# Patient Record
Sex: Male | Born: 1965 | Race: Black or African American | Hispanic: No | Smoking: Never smoker
Health system: Southern US, Community
[De-identification: ages and names within clinical notes are randomized; demographics above are authoritative.]

## PROBLEM LIST (undated history)

## (undated) ENCOUNTER — Emergency Department (HOSPITAL_COMMUNITY): Payer: Medicaid Other | Source: Home / Self Care

## (undated) ENCOUNTER — Emergency Department (HOSPITAL_COMMUNITY): Admission: EM | Discharge status: Absent without leave | Payer: MEDICAID | Source: Home / Self Care

## (undated) ENCOUNTER — Emergency Department (HOSPITAL_COMMUNITY): Admission: EM | Payer: Medicaid Other | Source: Home / Self Care

## (undated) DIAGNOSIS — J189 Pneumonia, unspecified organism: Secondary | ICD-10-CM

## (undated) DIAGNOSIS — S86012A Strain of left Achilles tendon, initial encounter: Secondary | ICD-10-CM

## (undated) DIAGNOSIS — K029 Dental caries, unspecified: Secondary | ICD-10-CM

## (undated) DIAGNOSIS — F32A Depression, unspecified: Secondary | ICD-10-CM

## (undated) DIAGNOSIS — S065X9A Traumatic subdural hemorrhage with loss of consciousness of unspecified duration, initial encounter: Secondary | ICD-10-CM

## (undated) DIAGNOSIS — I509 Heart failure, unspecified: Secondary | ICD-10-CM

## (undated) DIAGNOSIS — I2699 Other pulmonary embolism without acute cor pulmonale: Secondary | ICD-10-CM

## (undated) DIAGNOSIS — F319 Bipolar disorder, unspecified: Secondary | ICD-10-CM

## (undated) DIAGNOSIS — G4730 Sleep apnea, unspecified: Secondary | ICD-10-CM

## (undated) DIAGNOSIS — I10 Essential (primary) hypertension: Secondary | ICD-10-CM

## (undated) HISTORY — PX: APPENDECTOMY: SHX54

## (undated) HISTORY — PX: CYSTECTOMY: SUR359

## (undated) HISTORY — PX: FRACTURE SURGERY: SHX138

## (undated) HISTORY — DX: Sleep apnea, unspecified: G47.30

## (undated) HISTORY — PX: ELBOW SURGERY: SHX618

---

## 2007-05-18 DIAGNOSIS — I2699 Other pulmonary embolism without acute cor pulmonale: Secondary | ICD-10-CM

## 2007-05-18 HISTORY — DX: Other pulmonary embolism without acute cor pulmonale: I26.99

## 2013-01-01 ENCOUNTER — Ambulatory Visit
Admission: RE | Admit: 2013-01-01 | Discharge: 2013-01-01 | Disposition: A | Discharge status: Home / Self Care | Payer: Medicaid Other | Source: Ambulatory Visit | Attending: Family Medicine | Admitting: Family Medicine

## 2013-01-01 ENCOUNTER — Other Ambulatory Visit: Payer: Self-pay | Admitting: Family Medicine

## 2013-01-01 DIAGNOSIS — I749 Embolism and thrombosis of unspecified artery: Secondary | ICD-10-CM

## 2013-01-04 ENCOUNTER — Inpatient Hospital Stay (HOSPITAL_COMMUNITY)
Admission: EM | Admit: 2013-01-04 | Discharge: 2013-01-08 | DRG: 041 | Disposition: A | Discharge status: Home / Self Care | Payer: Medicaid Other | Attending: Pulmonary Disease | Admitting: Pulmonary Disease

## 2013-01-04 ENCOUNTER — Encounter (HOSPITAL_COMMUNITY): Payer: Self-pay | Admitting: Emergency Medicine

## 2013-01-04 ENCOUNTER — Emergency Department (HOSPITAL_COMMUNITY): Payer: Medicaid Other

## 2013-01-04 DIAGNOSIS — Z7901 Long term (current) use of anticoagulants: Secondary | ICD-10-CM

## 2013-01-04 DIAGNOSIS — Z86711 Personal history of pulmonary embolism: Secondary | ICD-10-CM

## 2013-01-04 DIAGNOSIS — J019 Acute sinusitis, unspecified: Secondary | ICD-10-CM | POA: Diagnosis present

## 2013-01-04 DIAGNOSIS — T458X1A Poisoning by other primarily systemic and hematological agents, accidental (unintentional), initial encounter: Secondary | ICD-10-CM

## 2013-01-04 DIAGNOSIS — I2699 Other pulmonary embolism without acute cor pulmonale: Secondary | ICD-10-CM | POA: Diagnosis present

## 2013-01-04 DIAGNOSIS — R51 Headache: Secondary | ICD-10-CM | POA: Diagnosis present

## 2013-01-04 DIAGNOSIS — D6832 Hemorrhagic disorder due to extrinsic circulating anticoagulants: Secondary | ICD-10-CM

## 2013-01-04 DIAGNOSIS — R791 Abnormal coagulation profile: Secondary | ICD-10-CM | POA: Diagnosis present

## 2013-01-04 DIAGNOSIS — I6200 Nontraumatic subdural hemorrhage, unspecified: Principal | ICD-10-CM

## 2013-01-04 DIAGNOSIS — I2510 Atherosclerotic heart disease of native coronary artery without angina pectoris: Secondary | ICD-10-CM | POA: Diagnosis present

## 2013-01-04 DIAGNOSIS — T45515A Adverse effect of anticoagulants, initial encounter: Secondary | ICD-10-CM | POA: Diagnosis present

## 2013-01-04 DIAGNOSIS — Z79899 Other long term (current) drug therapy: Secondary | ICD-10-CM

## 2013-01-04 DIAGNOSIS — T45511A Poisoning by anticoagulants, accidental (unintentional), initial encounter: Secondary | ICD-10-CM

## 2013-01-04 DIAGNOSIS — S065XAA Traumatic subdural hemorrhage with loss of consciousness status unknown, initial encounter: Secondary | ICD-10-CM | POA: Diagnosis present

## 2013-01-04 DIAGNOSIS — S065X9A Traumatic subdural hemorrhage with loss of consciousness of unspecified duration, initial encounter: Secondary | ICD-10-CM

## 2013-01-04 DIAGNOSIS — R000 Tachycardia, unspecified: Secondary | ICD-10-CM | POA: Diagnosis not present

## 2013-01-04 DIAGNOSIS — F3130 Bipolar disorder, current episode depressed, mild or moderate severity, unspecified: Secondary | ICD-10-CM | POA: Diagnosis present

## 2013-01-04 DIAGNOSIS — R519 Headache, unspecified: Secondary | ICD-10-CM | POA: Diagnosis present

## 2013-01-04 DIAGNOSIS — I498 Other specified cardiac arrhythmias: Secondary | ICD-10-CM | POA: Diagnosis not present

## 2013-01-04 DIAGNOSIS — F319 Bipolar disorder, unspecified: Secondary | ICD-10-CM | POA: Diagnosis present

## 2013-01-04 HISTORY — DX: Traumatic subdural hemorrhage with loss of consciousness of unspecified duration, initial encounter: S06.5X9A

## 2013-01-04 HISTORY — DX: Hemorrhagic disorder due to extrinsic circulating anticoagulants: D68.32

## 2013-01-04 HISTORY — DX: Bipolar disorder, unspecified: F31.9

## 2013-01-04 HISTORY — DX: Other pulmonary embolism without acute cor pulmonale: I26.99

## 2013-01-04 HISTORY — DX: Traumatic subdural hemorrhage with loss of consciousness status unknown, initial encounter: S06.5XAA

## 2013-01-04 LAB — URINALYSIS, ROUTINE W REFLEX MICROSCOPIC
Glucose, UA: NEGATIVE mg/dL
Hgb urine dipstick: NEGATIVE
Leukocytes, UA: NEGATIVE
Nitrite: NEGATIVE
Specific Gravity, Urine: 1.009 (ref 1.005–1.030)
Urobilinogen, UA: 0.2 mg/dL (ref 0.0–1.0)

## 2013-01-04 LAB — RAPID URINE DRUG SCREEN, HOSP PERFORMED
Amphetamines: NOT DETECTED
Barbiturates: NOT DETECTED
Benzodiazepines: NOT DETECTED
Cocaine: NOT DETECTED
Tetrahydrocannabinol: NOT DETECTED

## 2013-01-04 LAB — TYPE AND SCREEN
ABO/RH(D): A POS
Antibody Screen: NEGATIVE

## 2013-01-04 LAB — COMPREHENSIVE METABOLIC PANEL
Albumin: 4.1 g/dL (ref 3.5–5.2)
BUN: 9 mg/dL (ref 6–23)
Creatinine, Ser: 1.23 mg/dL (ref 0.50–1.35)
GFR calc Af Amer: 78 mL/min — ABNORMAL LOW (ref >90)
Total Bilirubin: 0.6 mg/dL (ref 0.3–1.2)
Total Protein: 8 g/dL (ref 6.0–8.3)

## 2013-01-04 LAB — APTT
aPTT: 114 seconds — ABNORMAL HIGH (ref 24–37)
aPTT: 33 seconds (ref 24–37)

## 2013-01-04 LAB — POCT I-STAT 3, ART BLOOD GAS (G3+)
pCO2 arterial: 41.1 mmHg (ref 35.0–45.0)
pO2, Arterial: 79 mmHg — ABNORMAL LOW (ref 80.0–100.0)

## 2013-01-04 LAB — ABO/RH: ABO/RH(D): A POS

## 2013-01-04 LAB — PROTIME-INR
INR: 5.07 (ref 0.00–1.49)
INR: 1.29 (ref 0.00–1.49)
Prothrombin Time: 44.8 seconds — ABNORMAL HIGH (ref 11.6–15.2)
Prothrombin Time: 15.8 seconds — ABNORMAL HIGH (ref 11.6–15.2)

## 2013-01-04 LAB — BASIC METABOLIC PANEL
CO2: 28 mEq/L (ref 19–32)
Calcium: 9.2 mg/dL (ref 8.4–10.5)
Creatinine, Ser: 1.38 mg/dL — ABNORMAL HIGH (ref 0.50–1.35)
GFR calc non Af Amer: 59 mL/min — ABNORMAL LOW (ref >90)
Glucose, Bld: 101 mg/dL — ABNORMAL HIGH (ref 70–99)
Sodium: 141 mEq/L (ref 135–145)

## 2013-01-04 LAB — PHOSPHORUS: Phosphorus: 3 mg/dL (ref 2.3–4.6)

## 2013-01-04 LAB — CBC
MCH: 28.7 pg (ref 26.0–34.0)
MCHC: 34 g/dL (ref 30.0–36.0)
MCV: 84.6 fL (ref 78.0–100.0)
Platelets: 271 10*3/uL (ref 150–400)
RDW: 14.6 % (ref 11.5–15.5)
WBC: 9.3 10*3/uL (ref 4.0–10.5)

## 2013-01-04 LAB — MAGNESIUM: Magnesium: 2.2 mg/dL (ref 1.5–2.5)

## 2013-01-04 LAB — STREP PNEUMONIAE URINARY ANTIGEN: Strep Pneumo Urinary Antigen: NEGATIVE

## 2013-01-04 LAB — MRSA PCR SCREENING: MRSA by PCR: NEGATIVE

## 2013-01-04 MED ORDER — OXYMETAZOLINE HCL 0.05 % NA SOLN
1.0000 | Freq: Two times a day (BID) | NASAL | Status: DC
  Administered 2013-01-04 – 2013-01-05 (×3): 1 via NASAL
  Filled 2013-01-04: qty 15

## 2013-01-04 MED ORDER — QUETIAPINE FUMARATE 400 MG PO TABS
800.0000 mg | ORAL_TABLET | Freq: Every day | ORAL | Status: DC
  Administered 2013-01-04 – 2013-01-07 (×4): 800 mg via ORAL
  Filled 2013-01-04 (×5): qty 2

## 2013-01-04 MED ORDER — FLUTICASONE PROPIONATE 50 MCG/ACT NA SUSP
2.0000 | Freq: Every day | NASAL | Status: DC
  Administered 2013-01-04 – 2013-01-08 (×5): 2 via NASAL
  Filled 2013-01-04 (×2): qty 16

## 2013-01-04 MED ORDER — ACETAMINOPHEN 325 MG PO TABS
650.0000 mg | ORAL_TABLET | ORAL | Status: DC | PRN
  Administered 2013-01-04: 650 mg via ORAL
  Filled 2013-01-04: qty 2

## 2013-01-04 MED ORDER — VITAMIN K1 10 MG/ML IJ SOLN
10.0000 mg | Freq: Every day | INTRAMUSCULAR | Status: AC
  Administered 2013-01-05 – 2013-01-06 (×2): 10 mg via SUBCUTANEOUS
  Filled 2013-01-04 (×2): qty 1

## 2013-01-04 MED ORDER — PANTOPRAZOLE SODIUM 40 MG IV SOLR
40.0000 mg | Freq: Every day | INTRAVENOUS | Status: DC
  Administered 2013-01-04: 40 mg via INTRAVENOUS
  Filled 2013-01-04 (×2): qty 40

## 2013-01-04 MED ORDER — VITAMIN K1 10 MG/ML IJ SOLN
10.0000 mg | Freq: Once | INTRAMUSCULAR | Status: AC
  Administered 2013-01-04: 10 mg via SUBCUTANEOUS
  Filled 2013-01-04: qty 1

## 2013-01-04 MED ORDER — VITAMIN K1 10 MG/ML IJ SOLN
10.0000 mg | Freq: Once | INTRAVENOUS | Status: AC
  Administered 2013-01-04: 10 mg via INTRAVENOUS
  Filled 2013-01-04: qty 1

## 2013-01-04 MED ORDER — LITHIUM CARBONATE ER 300 MG PO TBCR
600.0000 mg | EXTENDED_RELEASE_TABLET | Freq: Every day | ORAL | Status: DC
  Administered 2013-01-04 – 2013-01-07 (×4): 600 mg via ORAL
  Filled 2013-01-04 (×6): qty 2

## 2013-01-04 MED ORDER — FENTANYL CITRATE 0.05 MG/ML IJ SOLN
12.5000 ug | INTRAMUSCULAR | Status: DC | PRN
Start: 2013-01-04 — End: 2013-01-08
  Administered 2013-01-04 – 2013-01-05 (×3): 12.5 ug via INTRAVENOUS
  Administered 2013-01-07: 25 ug via INTRAVENOUS
  Administered 2013-01-07: 12.5 ug via INTRAVENOUS
  Administered 2013-01-07: 25 ug via INTRAVENOUS
  Administered 2013-01-08: 12.5 ug via INTRAVENOUS
  Filled 2013-01-04 (×7): qty 2

## 2013-01-04 MED ORDER — MIRTAZAPINE 45 MG PO TABS
45.0000 mg | ORAL_TABLET | Freq: Every day | ORAL | Status: DC
  Administered 2013-01-04 – 2013-01-07 (×4): 45 mg via ORAL
  Filled 2013-01-04 (×6): qty 1

## 2013-01-04 MED ORDER — LITHIUM CARBONATE 300 MG PO CAPS
600.0000 mg | ORAL_CAPSULE | Freq: Every day | ORAL | Status: DC
  Filled 2013-01-04: qty 2

## 2013-01-04 MED ORDER — AMOXICILLIN-POT CLAVULANATE 875-125 MG PO TABS
1.0000 | ORAL_TABLET | Freq: Two times a day (BID) | ORAL | Status: DC
  Administered 2013-01-04 – 2013-01-08 (×8): 1 via ORAL
  Filled 2013-01-04 (×9): qty 1

## 2013-01-04 MED ORDER — LITHIUM CARBONATE ER 450 MG PO TBCR
450.0000 mg | EXTENDED_RELEASE_TABLET | ORAL | Status: DC
  Administered 2013-01-05 – 2013-01-08 (×4): 450 mg via ORAL
  Filled 2013-01-04 (×7): qty 1

## 2013-01-04 MED ORDER — SODIUM CHLORIDE 0.9 % IV SOLN
INTRAVENOUS | Status: DC
  Administered 2013-01-04: 13:00:00 via INTRAVENOUS

## 2013-01-04 NOTE — ED Notes (Signed)
CC MD at bedside 

## 2013-01-04 NOTE — Progress Notes (Signed)
eLink Physician-Brief Progress Note Patient Name: Justin [name] DOB: October 07, 1965 MRN: [id number]  Date of Service  01/04/2013   HPI/Events of Note     eICU Interventions  Tyelnol/ fent prn pain   Intervention Category Minor Interventions: Routine modifications to care plan (e.g. PRN medications for pain, fever)  ALVA,RAKESH V. 01/04/2013, 7:03 PM

## 2013-01-04 NOTE — H&P (Signed)
PULMONARY  / CRITICAL CARE MEDICINE  Name: Justin [name] MRN: [id number] DOB: April 07, 1965    ADMISSION DATE:  01/04/2013   REFERRING MD : EDP PRIMARY SERVICE: *PCCM  CHIEF COMPLAINT:  headache  BRIEF PATIENT DESCRIPTION:  47 yo AAM disabled secondary to bipolar depression who recently moved to [place]. 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 He has had a headache for a week and has been self medicating with bufferin. He presents to [facility] with SDH and INR 5.9. He does nor require operative intervention.  We will need to reverse coumadin, obtain records from [place]. Of note he has a sinus infection which we will treat. Question is whether he can remain of coumadin in future.  SIGNIFICANT EVENTS / STUDIES:  10-19 sdh  LINES / TUBES:   CULTURES:   ANTIBIOTICS: 10-19 augmentin>>  HISTO69 yo AAM disabled secondary to bipolar depression who recently moved to [place]. 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 He has had a headache for a week and has been self medicating with bufferin. He presents to [facility] with SDH and INR 5.9. He does nor require operative intervention.  We will need to reverse coumadin, obtain records from [place]. Of note he has a sinus infection which we will treat. Question is whether he can remain of coumadin in future.RY OF PRESENT ILLNESS:     PAST MEDICAL HISTORY :  Past Medical History  Diagnosis Date  . Pulmonary embolism   . Bipolar 1 disorder    Past Surgical History  Procedure Laterality Date  . Appendectomy    . Cystectomy      right head   Prior to Admission medications   Medication Sig Start Date End Date Taking? Authorizing Provider  Homeopathic Products (SINUS MEDICINE PO) Take 1 tablet by mouth daily as needed (congestion).   Yes Historical Provider, MD  lithium 600 MG capsule Take 600 mg by mouth at bedtime.   Yes Historical Provider, MD  lithium carbonate (ESKALITH) 450 MG CR tablet Take 450 mg by mouth every morning.    Yes Historical Provider, MD  mirtazapine (REMERON) 45 MG tablet Take 45 mg by mouth at bedtime.   Yes Historical Provider, MD  naproxen sodium (ANAPROX) 220 MG tablet Take 440 mg by mouth daily as needed (pain).   Yes Historical Provider, MD  QUEtiapine (SEROQUEL) 400 MG tablet Take 800 mg by mouth at bedtime.   Yes Historical Provider, MD  warfarin (COUMADIN) 5 MG tablet Take 5 mg by mouth 3 (three) times a week. Take on Monday, Wednesday, and Friday. Alternate with 7.5mg    Yes Historical Provider, MD  warfarin (COUMADIN) 7.5 MG tablet Take 7.5 mg by mouth 4 (four) times a week. Take on Tuesday, Thursday, Saturday, and Sunday. Alternate with 5 mg.   Yes Historical Provider, MD   Not on File  FAMILY HISTORY:  No family hx of bleeds or clots SOCIAL HISTORY: Hx of cocaine abuse with recent detox REVIEW OF SYSTEMS:  . 10 point review of system taken, please see HPI for positives and negatives.   SUBJECTIVE:  Awake and alert VITAL SIGNS: Temp:  [97.8 F (36.6 C)-97.9 F (36.6 C)] 97.9 F (36.6 C) (10/19 1312) Pulse Rate:  [74-87] 83 (10/19 1312) Resp:  [18-20] 18 (10/19 1312) BP: (137-150)/(89-99) 150/90 mmHg (10/19 1312) SpO2:  [99 %-100 %] 99 % (10/19 1312) HEMODYNAMICS:   VENTILATOR SETTINGS:   INTAKE / OUTPUT: Intake/Output  10/18 0701 - 10/19 0700 10/19 0701 - 10/20 0700   Blood  12   Total Intake   12   Net   +12          PHYSICAL EXAMINATION: General:  wnwdaamnad @rest  Neuro:  Appears intact, speech clear, MAE x 4 HEENT:  Tenser frontal sinus area, no jador LAN Cardiovascular:  hsr rrr Lungs:  cta Abdomen:  Soft +bs Musculoskeletal:  Intact, no lower ext tenderness Skin:  warm  LABS:  CBC Recent Labs     01/04/13  1225  WBC  9.3  HGB  12.5*  HCT  36.8*  PLT  271   Coag's Recent Labs     01/04/13  1156  01/04/13  1225  APTT   --   114*  INR  5.07*   --    BMET No results found for this basename: NA, K, CL, CO2, BUN, CREATININE, GLUCOSE,  in  the last 72 hours Electrolytes No results found for this basename: CALCIUM, MG, PHOS,  in the last 72 hours Sepsis Markers No results found for this basename: LACTICACIDVEN, PROCALCITON, O2SATVEN,  in the last 72 hours ABG No results found for this basename: PHART, PCO2ART, PO2ART,  in the last 72 hours Liver Enzymes No results found for this basename: AST, ALT, ALKPHOS, BILITOT, ALBUMIN,  in the last 72 hours Cardiac Enzymes No results found for this basename: TROPONINI, PROBNP,  in the last 72 hours Glucose No results found for this basename: GLUCAP,  in the last 72 hours  Imaging Ct Head Wo Contrast  01/04/2013   CLINICAL DATA:  47 year old male with headache unresponsive to over the counter medication. History of blood clots, on Coumadin. Initial encounter.  EXAM: CT HEAD WITHOUT CONTRAST  TECHNIQUE: Contiguous axial images were obtained from the base of the skull through the vertex without intravenous contrast.  COMPARISON:  None.  FINDINGS: Opacified right frontal, ethmoid, and maxillary sinuses. Evidence of periosteal thickening suggesting chronic sinusitis. Other Visualized paranasal sinuses and mastoids are clear. No acute osseous abnormality identified.  Visualized orbits and scalp soft tissues are within normal limits.  Mixed density right subdural hematoma. Hyperdense blood products also along the interhemispheric fissure and tentorium. Mass effect on the right hemisphere with 9-10 mm of leftward midline shift. The subdural collection measures up to approximately 9 mm in thickness along much of the right convexity.  No ventriculomegaly. No evidence of cortically based acute infarction identified. Basilar cisterns remain patent. No suspicious intracranial vascular hyperdensity.  IMPRESSION: 1. Mixed density right subdural hematoma measuring up to about 9 mm in thickness with mass effect on the right hemisphere and associated 9-10 mm of leftward midline shift. Smaller volume parafalcine  and tentorial blood.  2. No ventriculomegaly and basilar cisterns remain patent.  3. Chronic appearing right paranasal sinusitis.  Critical Value/emergent results were called by telephone at the time of interpretation on 01/04/2013 at 12:00 PM to [facility] , who verbally acknowledged these results.   Electronically Signed   By: Augusto Gamble M.D.   On: 01/04/2013 12:01       ASSESSMENT / PLAN:  PULMONARY A:  Hx of PE P:   Stop coumadin Obtain records from [place] as to whether he will need IVC  CARDIOVASCULAR A:No acute issue P:    RENAL A:    No acute issueP:     GASTROINTESTINAL A: GI protection P:   ppi  HEMATOLOGIC A:  Elevated INR P:  4 ffp Vit k  x 3 days Serial inrs  INFECTIOUS A:  Sinus infection P:   Po augmentin  Flonase  ENDOCRINE A:  No acute issue P:     NEUROLOGIC A: SDH      Bipolar      Substance abuse P:   Reverse coumadin NS to follow  TODAY'S SUMMARY:  47 yo AAM disabled secondary to bipolar depression who recently moved to [place]. 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 He has had a headache for a week and has been self medicating with bufferin. He presents to [facility] with SDH and INR 5.9. He does nor require operative intervention.  We will need to reverse coumadin, obtain records from [place]. Of note he has a sinus infection which we will treat. Question is whether he can remain of coumadin in future.  Justin [name] ACNP [facility] PCCM Pager [phone] till 3 pm If no answer page [phone] 01/04/2013, 1:27 PM   I have interviewed and examined the patient and reviewed the database. I have formulated the assessment and plan as reflected in the note above with amendments made by me.  The following problems were identified and addressed on admission:  Active Problems:   SDH (subdural hematoma)   H/O PE   Chronic anticoagulation   Warfarin-induced coagulopathy   Acute sinusitis    Justin Fischer, MD;  PCCM service; [place]  [phone]

## 2013-01-04 NOTE — ED Provider Notes (Signed)
CSN: [id number]     Arrival date & time 01/04/13  1031 History   First MD Initiated Contact with Patient 01/04/13 1058     Chief Complaint  Patient presents with  . Headache   (Consider location/radiation/quality/duration/timing/severity/associated sxs/prior Treatment) Patient is a 47 y.o. male presenting with headaches. The history is provided by the patient.  Headache Associated symptoms: sinus pressure   Associated symptoms: no abdominal pain, no back pain, no diarrhea, no pain, no nausea, no neck stiffness, no numbness and no vomiting    patient's had a headache for the last 5-7 days. Is somewhat over his right sinuses and states that he feels congested. States it also is on the left side of his head. No difficulty seeing. No nausea or vomiting. No confusion. He states that today he also began to have some blood from his gums. He states he is on Coumadin for blood clots in his lungs. He recently moved to [place] from [place].  Past Medical History  Diagnosis Date  . Pulmonary embolism   . Bipolar 1 disorder    Past Surgical History  Procedure Laterality Date  . Appendectomy    . Cystectomy      right head   History reviewed. No pertinent family history. History  Substance Use Topics  . Smoking status: Never Smoker   . Smokeless tobacco: Not on file  . Alcohol Use: No    Review of Systems  Constitutional: Negative for activity change and appetite change.  HENT: Positive for sinus pressure. Negative for dental problem and nosebleeds.   Eyes: Negative for pain.  Respiratory: Negative for chest tightness and shortness of breath.   Cardiovascular: Negative for chest pain and leg swelling.  Gastrointestinal: Negative for nausea, vomiting, abdominal pain and diarrhea.  Genitourinary: Negative for flank pain.  Musculoskeletal: Negative for back pain and neck stiffness.  Skin: Negative for rash.  Neurological: Positive for headaches. Negative for weakness and numbness.   Psychiatric/Behavioral: Negative for behavioral problems.    Allergies  Review of patient's allergies indicates no known allergies.  Home Medications   Current Outpatient Rx  Name  Route  Sig  Dispense  Refill  . Homeopathic Products (SINUS MEDICINE PO)   Oral   Take 1 tablet by mouth daily as needed (congestion).         Marland Kitchen lithium 600 MG capsule   Oral   Take 600 mg by mouth at bedtime.         Marland Kitchen lithium carbonate (ESKALITH) 450 MG CR tablet   Oral   Take 450 mg by mouth every morning.         . mirtazapine (REMERON) 45 MG tablet   Oral   Take 45 mg by mouth at bedtime.         . naproxen sodium (ANAPROX) 220 MG tablet   Oral   Take 440 mg by mouth daily as needed (pain).         . QUEtiapine (SEROQUEL) 400 MG tablet   Oral   Take 800 mg by mouth at bedtime.         Marland Kitchen warfarin (COUMADIN) 5 MG tablet   Oral   Take 5 mg by mouth 3 (three) times a week. Take on Monday, Wednesday, and Friday. Alternate with 7.5mg          . warfarin (COUMADIN) 7.5 MG tablet   Oral   Take 7.5 mg by mouth 4 (four) times a week. Take on Tuesday, Thursday, Saturday, and Sunday.  Alternate with 5 mg.          BP 150/90  Pulse 72  Temp(Src) 97.9 F (36.6 C) (Oral)  Resp 13  SpO2 100% Physical Exam  Nursing note and vitals reviewed. Constitutional: He is oriented to person, place, and time. He appears well-developed and well-nourished.  HENT:  Head: Normocephalic and atraumatic.  Mild injection and swelling of nasal mucosa  Eyes: EOM are normal. Pupils are equal, round, and reactive to light.  Neck: Normal range of motion. Neck supple.  Cardiovascular: Normal rate, regular rhythm and normal heart sounds.   No murmur heard. Pulmonary/Chest: Effort normal and breath sounds normal.  Abdominal: Soft. Bowel sounds are normal. He exhibits no distension and no mass. There is no tenderness. There is no rebound and no guarding.  Musculoskeletal: Normal range of motion. He  exhibits no edema.  Neurological: He is alert and oriented to person, place, and time. No cranial nerve deficit.  Skin: Skin is warm and dry.  Psychiatric: He has a normal mood and affect.    ED Course  Procedures (including critical care time) Labs Review Labs Reviewed  PROTIME-INR - Abnormal; Notable for the following:    Prothrombin Time 44.8 (*)    INR 5.07 (*)    All other components within normal limits  APTT - Abnormal; Notable for the following:    aPTT 114 (*)    All other components within normal limits  BASIC METABOLIC PANEL - Abnormal; Notable for the following:    Glucose, Bld 101 (*)    Creatinine, Ser 1.38 (*)    GFR calc non Af Amer 59 (*)    GFR calc Af Amer 68 (*)    All other components within normal limits  CBC - Abnormal; Notable for the following:    Hemoglobin 12.5 (*)    HCT 36.8 (*)    All other components within normal limits  CULTURE, BLOOD (ROUTINE X 2)  CULTURE, BLOOD (ROUTINE X 2)  URINE CULTURE  COMPREHENSIVE METABOLIC PANEL  MAGNESIUM  PHOSPHORUS  LACTIC ACID, PLASMA  PROCALCITONIN  PROTIME-INR  APTT  URINALYSIS, ROUTINE W REFLEX MICROSCOPIC  STREP PNEUMONIAE URINARY ANTIGEN  LEGIONELLA ANTIGEN, URINE  BLOOD GAS, ARTERIAL  URINE RAPID DRUG SCREEN (HOSP PERFORMED)  TYPE AND SCREEN  PREPARE FRESH FROZEN PLASMA  PREPARE FRESH FROZEN PLASMA  ABO/RH   Imaging Review Ct Head Wo Contrast  01/04/2013   CLINICAL DATA:  47 year old male with headache unresponsive to over the counter medication. History of blood clots, on Coumadin. Initial encounter.  EXAM: CT HEAD WITHOUT CONTRAST  TECHNIQUE: Contiguous axial images were obtained from the base of the skull through the vertex without intravenous contrast.  COMPARISON:  None.  FINDINGS: Opacified right frontal, ethmoid, and maxillary sinuses. Evidence of periosteal thickening suggesting chronic sinusitis. Other Visualized paranasal sinuses and mastoids are clear. No acute osseous abnormality  identified.  Visualized orbits and scalp soft tissues are within normal limits.  Mixed density right subdural hematoma. Hyperdense blood products also along the interhemispheric fissure and tentorium. Mass effect on the right hemisphere with 9-10 mm of leftward midline shift. The subdural collection measures up to approximately 9 mm in thickness along much of the right convexity.  No ventriculomegaly. No evidence of cortically based acute infarction identified. Basilar cisterns remain patent. No suspicious intracranial vascular hyperdensity.  IMPRESSION: 1. Mixed density right subdural hematoma measuring up to about 9 mm in thickness with mass effect on the right hemisphere and associated 9-10 mm of  leftward midline shift. Smaller volume parafalcine and tentorial blood.  2. No ventriculomegaly and basilar cisterns remain patent.  3. Chronic appearing right paranasal sinusitis.  Critical Value/emergent results were called by telephone at the time of interpretation on 01/04/2013 at 12:00 PM to [facility] , who verbally acknowledged these results.   Electronically Signed   By: Augusto Gamble M.D.   On: 01/04/2013 12:01    EKG Interpretation   None       MDM   1. Subdural hematoma   2. Warfarin toxicity, initial encounter    Patient with headache. Likely sinusitis. Has subdural hematoma. He is on anticoagulation and is toxic with an INR of 5. After discussion with Dr. Newell Coral from neurosurgery patient was given IV vitamin K and 2 units of emergency release FFP. Critical care was consulted for further coagulation monitoring. Patient will be admitted to the ICU.  CRITICAL CARE Performed by: Billee Cashing Total critical care time: 30 Critical care time was exclusive of separately billable procedures and treating other patients. Critical care was necessary to treat or prevent imminent or life-threatening deterioration. Critical care was time spent personally by me on the following activities:  development of treatment plan with patient and/or surrogate as well as nursing, discussions with consultants, evaluation of patient's response to treatment, examination of patient, obtaining history from patient or surrogate, ordering and performing treatments and interventions, ordering and review of laboratory studies, ordering and review of radiographic studies, pulse oximetry and re-evaluation of patient's condition.     Juliet Rude. Rubin Payor, MD 01/04/13 1437

## 2013-01-04 NOTE — ED Notes (Signed)
Pt c/o HA x 1 week. Pt states OTC medications ineffective. Denies CP, SOB, LOC. NSD. Pt states he is on warfarin for blood clots

## 2013-01-04 NOTE — Consult Note (Signed)
Reason for Consult: Right hemispheric subdural  Referring Physician:  Dr. Benjiman Core  Justin [name] is an 47 y.o. left-handed black male.  HPI: Patient presented to the emergency room today because of bleeding in and around his mouth. He also describes a one-week history of right hemicranium headache and right nasal congestion with discomfort over the right maxillary sinus. He did use some over-the-counter sinus medications this past week. He does report a history of sinusitis.  His history is notable for history of pulmonary embolism diagnosed and managed in [place], [place]. He's been on Coumadin for at least the past 4 or 5 years. He explained that he takes Coumadin 7.5 mg on Tuesday, Thursday, Saturday, and Sunday and 5 mg on Monday, Wednesday, and Friday. He also has a history of crack cocaine abuse and just finished a drug and alcohol treatment program, and moved from [facility] 2 and a half weeks ago to live with his sister here in [place] area. Lastly he has a history of bipolar disorder for which he takes Seroquel, Remeron, and lithium.  Patient denies other complaints. Denies any seizures, weakness, double vision, blurred vision, or sensory dysfunction.  Patient was evaluated by Dr. Benjiman Core (EDP). He reported that the patient was neurologically intact. CT scan of the brain without contrast revealed a mixed density right hemispheric subdural hematoma with what appears to be some acute subdural hematoma overlying the right tentorium. There is mass effect and right to left shift. Also noted is what appears to be opacification of the right maxillary and ethmoid sinuses, although the sinuses are not fully visualized on the CT of the brain. Neurosurgical consultation requested. I recommended reversal of his anticoagulation at least initially with vitamin K 10 mg IV and 10 mg subcutaneous, as well as 2 units of fresh frozen plasma. Subsequently his laboratory was returned  revealing a profound iatrogenic coagulopathy with a prothrombin time (PT) a 44.8 and a INR 5.07. With the patient being neurologically intact I do not anticipate the patient requiring immediate surgical intervention, and therefore recommended critical care medicine consultation for admission, and that I would be able to follow the patient is a neurosurgical consultant.  Past medical history: Notable for history of pulmonary embolism, crack cocaine abuse, and bipolar disorder. He denies any history of hypertension, myocardial infarction, cancer, ecchymoses, stroke, diabetes, lung disease.    Past surgical history: Appendectomy and removal of cysts from the scalp.   He denies allergies to medications.   His medications include Seroquel 800 mg each bedtime, Remeron 45 mg each bedtime, lithium 450 mg twice a day and 600 mg each bedtime, and Coumadin 7.5 mg on Tuesday, Thursday, Saturday, and Sunday, and 5 mg on Monday, Wednesday, and Friday.  Family history:  Mother died age 44, she had a history of hypertension,.a massive stroke. Father died at an early age and patient does not know the cause.  Social history: Patient has never married. He has 4 children, including one with Downs syndrome who lives with his sister here in [place]. Patient doesn't smoke or drink or beverages but does have a history of crack cocaine use. Patient's been on disability for the past 7 years.  ROS:  Notable for those difficulties described as history of present illness and past medical history.  Physical Examination: Patient a well-developed well nourished black male in no acute distress. Blood pressure 137/89, pulse 74, temperature 97.8 F (36.6 C), temperature source Oral, resp. rate 20, SpO2 100.00%. Lungs:  Clear to  auscultation, he has symmetrical respiratory excursion. Heart:  Regular rhythm, normal S1-S2. Abdomen:  Soft, nondistended, bowel sounds present Extremity:  No clubbing, cyanosis, or  edema.  Neurological Examination: Mental Status Examination:  Awake, alert, oriented to name, [facility], [place], and 01/04/2013. Speech is fluent. Good comprehension. Cranial Nerve Examination:  Pupils are equal round and reactive light. EOMI. Facial sensation intact. Facial movements symmetrical. Hearing present bilaterally. Palatal movement symmetrical. Shoulder shrug is symmetrical. Tongue midline Motor Examination:  5/5 strength in the upper and lower extremities, including the deltoid, biceps, triceps, intrinsics, grips, iliopsoas, quadriceps, dorsiflexors, and plantar flexors bilaterally. He has no drift of the upper extremities. Sensory Examination:  Intact to pinprick to the upper and lower extremities Reflex Examination:   Trace in the upper and lower extremities. Toes downgoing bilaterally. Gait and Stance Examination:  Not tested due to the nature the patient's condition.   Results for orders placed during the hospital encounter of 01/04/13 (from the past 48 hour(s))  PROTIME-INR     Status: Abnormal   Collection Time    01/04/13 11:56 AM      Result Value Range   Prothrombin Time 44.8 (*) 11.6 - 15.2 seconds   INR 5.07 (*) 0.00 - 1.49   Comment: REPEATED TO VERIFY     CRITICAL RESULT CALLED TO, READ BACK BY AND VERIFIED WITH:     C.ERICSON,RN 1253 01/04/13 M.CAMPBELL  APTT     Status: Abnormal   Collection Time    01/04/13 12:25 PM      Result Value Range   aPTT 114 (*) 24 - 37 seconds   Comment:            IF BASELINE aPTT IS ELEVATED,     SUGGEST PATIENT RISK ASSESSMENT     BE USED TO DETERMINE APPROPRIATE     ANTICOAGULANT THERAPY.  CBC     Status: Abnormal   Collection Time    01/04/13 12:25 PM      Result Value Range   WBC 9.3  4.0 - 10.5 K/uL   RBC 4.35  4.22 - 5.81 MIL/uL   Hemoglobin 12.5 (*) 13.0 - 17.0 g/dL   HCT 24.4 (*) 01.0 - 27.2 %   MCV 84.6  78.0 - 100.0 fL   MCH 28.7  26.0 - 34.0 pg   MCHC 34.0  30.0 - 36.0 g/dL   RDW 53.6  64.4 -  03.4 %   Platelets 271  150 - 400 K/uL  PREPARE FRESH FROZEN PLASMA     Status: None   Collection Time    01/04/13  1:00 PM      Result Value Range   Unit Number [id number]     Blood Component Type THAWED PLASMA     Unit division 00     Status of Unit ISSUED     Unit tag comment VERBAL ORDERS PER DR PICKERING     Transfusion Status OK TO TRANSFUSE     Unit Number [id number]     Blood Component Type THAWED PLASMA     Unit division 00     Status of Unit ISSUED     Unit tag comment VERBAL ORDERS PER DR PICKERING     Transfusion Status OK TO TRANSFUSE      Ct Head Wo Contrast  01/04/2013   CLINICAL DATA:  47 year old male with headache unresponsive to over the counter medication. History of blood clots, on Coumadin. Initial encounter.  EXAM: CT HEAD WITHOUT CONTRAST  TECHNIQUE: Contiguous axial images were obtained from the base of the skull through the vertex without intravenous contrast.  COMPARISON:  None.  FINDINGS: Opacified right frontal, ethmoid, and maxillary sinuses. Evidence of periosteal thickening suggesting chronic sinusitis. Other Visualized paranasal sinuses and mastoids are clear. No acute osseous abnormality identified.  Visualized orbits and scalp soft tissues are within normal limits.  Mixed density right subdural hematoma. Hyperdense blood products also along the interhemispheric fissure and tentorium. Mass effect on the right hemisphere with 9-10 mm of leftward midline shift. The subdural collection measures up to approximately 9 mm in thickness along much of the right convexity.  No ventriculomegaly. No evidence of cortically based acute infarction identified. Basilar cisterns remain patent. No suspicious intracranial vascular hyperdensity.  IMPRESSION: 1. Mixed density right subdural hematoma measuring up to about 9 mm in thickness with mass effect on the right hemisphere and associated 9-10 mm of leftward midline shift. Smaller volume parafalcine and tentorial blood.   2. No ventriculomegaly and basilar cisterns remain patent.  3. Chronic appearing right paranasal sinusitis.  Critical Value/emergent results were called by telephone at the time of interpretation on 01/04/2013 at 12:00 PM to [facility] , who verbally acknowledged these results.   Electronically Signed   By: Augusto Gamble M.D.   On: 01/04/2013 12:01     Assessment/Plan: Patient had presented with bleeding is developed in and around his mouth today, but with an one week history of right hemicranial headache and right nasal congestion and discomfort over the right basilar sinus. Workup has revealed a profound iatrogenic coagulopathy with a PT of 44.8 and an INR of 5.07. Additionally CT of the brain revealed a right hemispheric subdural hematoma with associated mass effect and shift, as well as opacification of the right maxillary and ethmoid sinuses. Neurologically he is intact.  I do not feel that immediate surgical intervention is necessary, but certainly the iatrogenic coagulopathy must be reversed promptly. He will need followup CT scans of the head to monitor the subdural hematoma, and it is possible that this may be able to be managed nonsurgically.  I discussed case both with Dr. Rubin Payor as well as with Dr. Billy Fischer from critical care medicine. Treatment with vitamin K and fresh frozen plasma have been initiated, and the critical care medicine service will manage the continued reversal of anticoagulation and normalization of his coagulation studies. They will also follow up regarding his history of pulmonary embolism and the question of whether he he may need a IVC filter and/or resumption of anticoagulation once he has recovered from the subdural hematoma. I feel that if the patient is neurologically stable for 12 hours, he can be started on a diet, but in the meantime he can take his medications with sips of water.  Patient is being admitted to the CCM service, and I will follow with  them from a neurosurgical perspective.  I favor observation and neuro checks in the neurosurgical ICU. I will defer to their judgment whether ENT consultation will be helpful as regards the sinusitis.  He will need a repeat CT of the brain contrast in a couple of days, or sooner if he has any neurologic deterioration.  Hewitt Shorts, MD 01/04/2013, 1:02 PM

## 2013-01-04 NOTE — ED Notes (Signed)
Patient transported to CT 

## 2013-01-05 ENCOUNTER — Encounter (HOSPITAL_COMMUNITY): Payer: Self-pay | Admitting: Radiology

## 2013-01-05 ENCOUNTER — Inpatient Hospital Stay (HOSPITAL_COMMUNITY): Payer: Medicaid Other

## 2013-01-05 DIAGNOSIS — I2699 Other pulmonary embolism without acute cor pulmonale: Secondary | ICD-10-CM

## 2013-01-05 LAB — PREPARE FRESH FROZEN PLASMA
Unit division: 0
Unit division: 0
Unit division: 0

## 2013-01-05 LAB — CBC
HCT: 34.9 % — ABNORMAL LOW (ref 39.0–52.0)
Hemoglobin: 11.6 g/dL — ABNORMAL LOW (ref 13.0–17.0)
MCHC: 33.2 g/dL (ref 30.0–36.0)
Platelets: 237 10*3/uL (ref 150–400)
RDW: 14.5 % (ref 11.5–15.5)
WBC: 9.9 10*3/uL (ref 4.0–10.5)

## 2013-01-05 LAB — BASIC METABOLIC PANEL
BUN: 9 mg/dL (ref 6–23)
CO2: 24 mEq/L (ref 19–32)
Chloride: 104 mEq/L (ref 96–112)
GFR calc Af Amer: 80 mL/min — ABNORMAL LOW (ref >90)
GFR calc non Af Amer: 69 mL/min — ABNORMAL LOW (ref >90)
Glucose, Bld: 110 mg/dL — ABNORMAL HIGH (ref 70–99)
Potassium: 3.8 mEq/L (ref 3.5–5.1)
Sodium: 138 mEq/L (ref 135–145)

## 2013-01-05 LAB — URINE CULTURE: Culture: NO GROWTH

## 2013-01-05 LAB — LEGIONELLA ANTIGEN, URINE: Legionella Antigen, Urine: NEGATIVE

## 2013-01-05 LAB — PROTIME-INR: INR: 1.21 (ref 0.00–1.49)

## 2013-01-05 MED ORDER — SODIUM CHLORIDE 0.9 % IV SOLN: INTRAVENOUS | Status: DC

## 2013-01-05 MED ORDER — HYDROCODONE-ACETAMINOPHEN 5-325 MG PO TABS
1.0000 | ORAL_TABLET | ORAL | Status: DC | PRN
  Administered 2013-01-05 (×2): 1 via ORAL
  Administered 2013-01-06 – 2013-01-08 (×4): 2 via ORAL
  Filled 2013-01-05: qty 2
  Filled 2013-01-05 (×2): qty 1
  Filled 2013-01-05 (×3): qty 2

## 2013-01-05 MED ORDER — PANTOPRAZOLE SODIUM 40 MG PO TBEC
40.0000 mg | DELAYED_RELEASE_TABLET | Freq: Every day | ORAL | Status: DC
  Administered 2013-01-05 – 2013-01-08 (×4): 40 mg via ORAL
  Filled 2013-01-05 (×5): qty 1

## 2013-01-05 NOTE — Clinical Documentation Improvement (Signed)
THIS DOCUMENT IS NOT A PERMANENT PART OF THE MEDICAL RECORD  Please update your documentation within the medical record to reflect your response to this query. If you need help knowing how to do this please call [phone].  01/05/13  Dear Dr. Marton Redwood  In a better effort to capture your patient's severity of illness, reflect appropriate length of stay and utilization of resources, a review of the medical record has revealed the following indicators.   Based on your clinical judgment, please clarify and document in a progress note and/or discharge summary the clinical condition associated with the following supporting information: In responding to this query please exercise your independent judgment.  The fact that a query is asked, does not imply that any particular answer is desired or expected.   Hello Dr. Newell Coral!  Abnormal findings (laboratory, x-ray, MRI/CT scans, and other diagnostic results) are not coded and reported unless the physician indicates their clinical significance. The medical record reflects the following clinical findings. If possible, please help by clarifying the diagnostic and/or clinical significance of these abnormal findings. Thank you!  CT Head 10/20 Findings:  9 mm of right to left subfalcine herniation persists with right lateral ventricular effacement, no hydrocephalous or definit entrapment. Basal cisterns are patent.   Possible Clinical Conditions?  - Subfalcine Herniation  - Other condition (please document in the progress notes and/or discharge summary)  - Cannot Clinically determine at this time     Reviewed:  no additional documentation provided   Thank You,  Saul Fordyce  Clinical Documentation Specialist: [phone] Health Information Management [place]

## 2013-01-05 NOTE — Progress Notes (Signed)
PULMONARY  / CRITICAL CARE MEDICINE  Name: ANGUEL DELAPENA MRN: [id number] DOB: Mar 14, 1966    ADMISSION DATE:  01/04/2013   REFERRING MD : EDP PRIMARY SERVICE: *PCCM  CHIEF COMPLAINT:  headache  BRIEF PATIENT DESCRIPTION:  47 yo AAM disabled secondary to bipolar depression who recently moved to [place]. 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 He has had a headache for a week and has been self medicating with bufferin. He presents to [place] with SDH and INR 5.9. He does nor require operative intervention.  We will need to reverse coumadin, obtain records from [place]. Of note he has a sinus infection which we will treat. Question is whether he can remain of coumadin in future.  SIGNIFICANT EVENTS / STUDIES:  10-19 SDH 10/19 CT head>>>Mixed density right subdural hematoma measuring up to about 9 mm  in thickness with mass effect on the right hemisphere and associated  9-10 mm of leftward midline shift. Smaller volume parafalcine and  tentorial blood. No ventriculomegaly and basilar cisterns remain patent. Chronic appearing right paranasal sinusitis.  10/20 ct head>>>Evolving right holohemispheric subdural hematoma, measuring 8 mm<BR>today, with persistent 9 mm of right to left midline shift.Minimal right parafalcine and right cerebellar tentorium subdural<BR>hematoma, similar.No hydrocephalous  LINES / TUBES:  CULTURES:  ANTIBIOTICS: 10-19 PO augmentin>> Day 2  SUBJECTIVE:  Awake, alert, and eating breakfast.  Patient continues to complain of 8/10 headache.      VITAL SIGNS: Temp:  [97.7 F (36.5 C)-98.5 F (36.9 C)] 97.9 F (36.6 C) (10/20 0700) Pulse Rate:  [70-114] 83 (10/20 0700) Resp:  [8-20] 17 (10/20 0700) BP: (123-157)/(80-107) 137/95 mmHg (10/20 0700) SpO2:  [94 %-100 %] 97 % (10/20 0700) Weight:  [231 lb 0.7 oz (104.8 kg)] 231 lb 0.7 oz (104.8 kg) (10/20 0500) HEMODYNAMICS:     INTAKE / OUTPUT: Intake/Output     10/19 0701 - 10/20 0700 10/20 0701 -  10/21 0700   P.O. 500    I.V. (mL/kg) 1350 (12.9)    Blood 792.5    Total Intake(mL/kg) 2642.5 (25.2)    Urine (mL/kg/hr) 3050    Total Output 3050     Net -407.5            PHYSICAL EXAMINATION: General:  WDWN male, awake and sitting up.   Neuro:  PERLA, EOMi, CN II-XII grossly intact, speech and communication appropriate and clear.  Sensation to light touch intact.   HEENT:  Mild tenderness to palpation over the frontal sinus Cardiovascular:  RRR no M/R/G Lungs:  CTA A&P, regular effort Abdomen:  +BS x4 quadrants, soft, nontender Musculoskeletal:  Appropriate movement x 4 limbs, no gross deformities, no LE tenderness Skin:  Clean, warm, and dry  LABS:  CBC Recent Labs     01/04/13  1225  01/05/13  0620  WBC  9.3  9.9  HGB  12.5*  11.6*  HCT  36.8*  34.9*  PLT  271  237   Coag's Recent Labs     01/04/13  1156  01/04/13  1225  01/04/13  2227  01/05/13  0620  APTT   --   114*  33   --   INR  5.07*   --   1.29  1.21   BMET Recent Labs     01/04/13  1225  01/04/13  2227  01/05/13  0620  NA  141  141  138  K  4.4  3.7  3.8  CL  106  105  104  CO2  28  26  24   BUN  8  9  9   CREATININE  1.38*  1.23  1.21  GLUCOSE  101*  95  110*   Electrolytes Recent Labs     01/04/13  1225  01/04/13  2227  01/05/13  0620  CALCIUM  9.2  9.4  9.0  MG   --   2.2   --   PHOS   --   3.0   --    ABG Recent Labs     01/04/13  1437  PHART  7.392  PCO2ART  41.1  PO2ART  79.0*   Liver Enzymes Recent Labs     01/04/13  2227  AST  18  ALT  20  ALKPHOS  111  BILITOT  0.6  ALBUMIN  4.1   Imaging Ct Head Wo Contrast  01/05/2013   *RADIOLOGY REPORT*  Clinical Data: Follow up subdural hematoma.  CT HEAD WITHOUT CONTRAST  Technique:  Contiguous axial images were obtained from the base of the skull through the vertex without contrast.  Comparison: CT of the head January 04, 2013 at 1136 hours.  Findings: 8 mm right holohemispheric subdural hematoma with hematocrit level  consistent with degenerating blood products seen, similar in size.  3 mm hyperdense right parafalcine subdural hematoma in addition to small subdural hematoma along the right cerebellar tentorium.  9 mm of right to left subfalcine herniation persists with right lateral ventricular effacement, no hydrocephalous or definite entrapment.  Basal cisterns are patent.  No skull fracture.  Right paranasal sinusitis incompletely characterized with complete opacification of the right maxillary sinus, with slight central hyperdensity which could reflect a inspissated mucus or even superimposed fungal infection.  Included ocular globes and orbital contents are not suspicious though, not changed.  IMPRESSION: Evolving right holohemispheric subdural hematoma, measuring 8 mm today, with persistent 9 mm of right to left midline shift. Minimal right parafalcine and right cerebellar tentorium subdural hematoma, similar.  No hydrocephalous.   Original Report Authenticated By: Awilda Metro   Ct Head Wo Contrast  01/04/2013   CLINICAL DATA:  47 year old male with headache unresponsive to over the counter medication. History of blood clots, on Coumadin. Initial encounter.  EXAM: CT HEAD WITHOUT CONTRAST  TECHNIQUE: Contiguous axial images were obtained from the base of the skull through the vertex without intravenous contrast.  COMPARISON:  None.  FINDINGS: Opacified right frontal, ethmoid, and maxillary sinuses. Evidence of periosteal thickening suggesting chronic sinusitis. Other Visualized paranasal sinuses and mastoids are clear. No acute osseous abnormality identified.  Visualized orbits and scalp soft tissues are within normal limits.  Mixed density right subdural hematoma. Hyperdense blood products also along the interhemispheric fissure and tentorium. Mass effect on the right hemisphere with 9-10 mm of leftward midline shift. The subdural collection measures up to approximately 9 mm in thickness along much of the right  convexity.  No ventriculomegaly. No evidence of cortically based acute infarction identified. Basilar cisterns remain patent. No suspicious intracranial vascular hyperdensity.  IMPRESSION: 1. Mixed density right subdural hematoma measuring up to about 9 mm in thickness with mass effect on the right hemisphere and associated 9-10 mm of leftward midline shift. Smaller volume parafalcine and tentorial blood.  2. No ventriculomegaly and basilar cisterns remain patent.  3. Chronic appearing right paranasal sinusitis.  Critical Value/emergent results were called by telephone at the time of interpretation on 01/04/2013 at 12:00 PM to [facility] , who verbally acknowledged  these results.   Electronically Signed   By: Augusto Gamble M.D.   On: 01/04/2013 12:01     ASSESSMENT / PLAN:  PULMONARY A:  Hx of PE P:   Stop coumadin Obtain records from [place] as to whether he will need IVC I am favoring need filter given history alone, regardless of dvt present or not at same time of PE  CARDIOVASCULAR A:No acute issue P:   RENAL A:    No acute issues: P:   Will decrease IV fluids to -kvo as patient is now eating  GASTROINTESTINAL A: GI protection P:   ppi diet  HEMATOLOGIC A:  Elevated INR - resolved 10/20 P:  Vit k x 2 days D/c serial INR if normal  INFECTIOUS A:  Sinus infection P:   PO augmentin day 2, consider 10 days Flonase  ENDOCRINE A:  No acute issue P:  cbg   NEUROLOGIC A: SDH      Bipolar      Substance abuse      Pain P:   -NS following -Lithium and seroquel for bipolar -Tylenol prn for pain -Fentanyl prn for pain   TODAY'S SUMMARY:  47 yo AAM admitted 10/19 for SDH with right subfalxian shift due to warfarin induced coagulopathy.  PT and INR have normalized.  Still awaiting records from [place] to determine whether patient requires the use of IVC filter.    Patient being followed by neurosurgery for SDH, but at this time does not require surgery.  Will continue to  treat sinus infection with PO augmentin x 10 days and Flonase.       Active Problems:   SDH (subdural hematoma)   H/O PE   Chronic anticoagulation   Warfarin-induced coagulopathy   Acute sinusitis  Carley Hammed Physician Assistant Student 01/05/13, 8:27 am  I have fully examined this patient and agree with above findings.    And edited in full  Mcarthur Rossetti. Tyson Alias, MD, FACP Pgr: [phone] [facility]

## 2013-01-05 NOTE — Progress Notes (Signed)
Subjective: Patient resting in bed comfortably, still has right hemicranial headache. Ate lunch. CT brain without contrast this morning shows no significant change in right hemispheric mixed density subdural hematoma and a layering of subdural hematoma over the right tentorium.  Objective: Vital signs in last 24 hours: Filed Vitals:   01/05/13 1000 01/05/13 1100 01/05/13 1200 01/05/13 1300  BP: 127/91 127/71 130/81 147/91  Pulse: 80 83 91 89  Temp:  98.3 F (36.8 C)    TempSrc:  Oral    Resp: 10 12 18 18   Height:      Weight:      SpO2: 96% 97% 100% 97%    Intake/Output from previous day: 10/19 0701 - 10/20 0700 In: 2642.5 [P.O.:500; I.V.:1350; Blood:792.5] Out: 3050 [Urine:3050] Intake/Output this shift: Total I/O In: 670 [P.O.:420; I.V.:250] Out: 1700 [Urine:1700]  Physical Exam:  Awake alert, oriented. Following commands. Good comprehension. Pupils equal round reactive to light, and about 2.5 mm bilaterally. EOMI. Facial movements symmetrical. Moving all 4 extremities well.  CBC  Recent Labs  01/04/13 1225 01/05/13 0620  WBC 9.3 9.9  HGB 12.5* 11.6*  HCT 36.8* 34.9*  PLT 271 237   BMET  Recent Labs  01/04/13 2227 01/05/13 0620  NA 141 138  K 3.7 3.8  CL 105 104  CO2 26 24  GLUCOSE 95 110*  BUN 9 9  CREATININE 1.23 1.21  CALCIUM 9.4 9.0   ABG    Component Value Date/Time   PHART 7.392 01/04/2013 1437   PCO2ART 41.1 01/04/2013 1437   PO2ART 79.0* 01/04/2013 1437   HCO3 25.0* 01/04/2013 1437   TCO2 26 01/04/2013 1437   O2SAT 95.0 01/04/2013 1437    Studies/Results: Ct Head Wo Contrast  01/05/2013   *RADIOLOGY REPORT*  Clinical Data: Follow up subdural hematoma.  CT HEAD WITHOUT CONTRAST  Technique:  Contiguous axial images were obtained from the base of the skull through the vertex without contrast.  Comparison: CT of the head January 04, 2013 at 1136 hours.  Findings: 8 mm right holohemispheric subdural hematoma with hematocrit level consistent  with degenerating blood products seen, similar in size.  3 mm hyperdense right parafalcine subdural hematoma in addition to small subdural hematoma along the right cerebellar tentorium.  9 mm of right to left subfalcine herniation persists with right lateral ventricular effacement, no hydrocephalous or definite entrapment.  Basal cisterns are patent.  No skull fracture.  Right paranasal sinusitis incompletely characterized with complete opacification of the right maxillary sinus, with slight central hyperdensity which could reflect a inspissated mucus or even superimposed fungal infection.  Included ocular globes and orbital contents are not suspicious though, not changed.  IMPRESSION: Evolving right holohemispheric subdural hematoma, measuring 8 mm today, with persistent 9 mm of right to left midline shift. Minimal right parafalcine and right cerebellar tentorium subdural hematoma, similar.  No hydrocephalous.   Original Report Authenticated By: Awilda Metro   Ct Head Wo Contrast  01/04/2013   CLINICAL DATA:  47 year old male with headache unresponsive to over the counter medication. History of blood clots, on Coumadin. Initial encounter.  EXAM: CT HEAD WITHOUT CONTRAST  TECHNIQUE: Contiguous axial images were obtained from the base of the skull through the vertex without intravenous contrast.  COMPARISON:  None.  FINDINGS: Opacified right frontal, ethmoid, and maxillary sinuses. Evidence of periosteal thickening suggesting chronic sinusitis. Other Visualized paranasal sinuses and mastoids are clear. No acute osseous abnormality identified.  Visualized orbits and scalp soft tissues are within normal limits.  Mixed  density right subdural hematoma. Hyperdense blood products also along the interhemispheric fissure and tentorium. Mass effect on the right hemisphere with 9-10 mm of leftward midline shift. The subdural collection measures up to approximately 9 mm in thickness along much of the right convexity.   No ventriculomegaly. No evidence of cortically based acute infarction identified. Basilar cisterns remain patent. No suspicious intracranial vascular hyperdensity.  IMPRESSION: 1. Mixed density right subdural hematoma measuring up to about 9 mm in thickness with mass effect on the right hemisphere and associated 9-10 mm of leftward midline shift. Smaller volume parafalcine and tentorial blood.  2. No ventriculomegaly and basilar cisterns remain patent.  3. Chronic appearing right paranasal sinusitis.  Critical Value/emergent results were called by telephone at the time of interpretation on 01/04/2013 at 12:00 PM to [facility] , who verbally acknowledged these results.   Electronically Signed   By: Augusto Gamble M.D.   On: 01/04/2013 12:01    Assessment/Plan: Iatrogenic coagulopathy reversed. CT scan of brain stable. Patient remains neurologically intact.  If patient continued to remain neurologically intact and CT scan remains stable we'll plan on eventually following the patient as an outpatient with serial CT scans, with the hope that the subdural hematoma will resolve on its own.  We'll begin to mobilize, and transition to by mouth pain pills. We'll plan on repeating CT scan later this week.   Hewitt Shorts, MD 01/05/2013, 1:45 PM

## 2013-01-06 ENCOUNTER — Inpatient Hospital Stay (HOSPITAL_COMMUNITY): Payer: Medicaid Other

## 2013-01-06 ENCOUNTER — Encounter (HOSPITAL_COMMUNITY): Payer: Self-pay

## 2013-01-06 LAB — CBC
MCV: 84.3 fL (ref 78.0–100.0)
Platelets: 268 10*3/uL (ref 150–400)
RBC: 4.28 MIL/uL (ref 4.22–5.81)
RDW: 14.4 % (ref 11.5–15.5)
WBC: 10.3 10*3/uL (ref 4.0–10.5)

## 2013-01-06 LAB — PROTIME-INR
INR: 1.09 (ref 0.00–1.49)
Prothrombin Time: 13.9 seconds (ref 11.6–15.2)

## 2013-01-06 MED ORDER — FENTANYL CITRATE 0.05 MG/ML IJ SOLN
INTRAMUSCULAR | Status: AC
  Filled 2013-01-06: qty 4

## 2013-01-06 MED ORDER — FENTANYL CITRATE 0.05 MG/ML IJ SOLN
INTRAMUSCULAR | Status: AC | PRN
  Administered 2013-01-06 (×2): 50 ug via INTRAVENOUS

## 2013-01-06 MED ORDER — SODIUM CHLORIDE 0.9 % IV SOLN: INTRAVENOUS | Status: DC | PRN

## 2013-01-06 MED ORDER — IOHEXOL 300 MG/ML  SOLN
100.0000 mL | Freq: Once | INTRAMUSCULAR | Status: AC | PRN
  Administered 2013-01-06: 50 mL via INTRAVENOUS

## 2013-01-06 MED ORDER — SALINE SPRAY 0.65 % NA SOLN
1.0000 | Freq: Two times a day (BID) | NASAL | Status: DC
  Administered 2013-01-06 – 2013-01-08 (×5): 1 via NASAL
  Filled 2013-01-06: qty 44

## 2013-01-06 MED ORDER — MIDAZOLAM HCL 2 MG/2ML IJ SOLN
INTRAMUSCULAR | Status: AC
  Filled 2013-01-06: qty 4

## 2013-01-06 MED ORDER — MIDAZOLAM HCL 2 MG/2ML IJ SOLN
INTRAMUSCULAR | Status: AC | PRN
  Administered 2013-01-06: 2 mg via INTRAVENOUS

## 2013-01-06 NOTE — Procedures (Signed)
IVC filter  

## 2013-01-06 NOTE — Procedures (Signed)
IVC filter No comp 

## 2013-01-06 NOTE — Consult Note (Signed)
HPI: Justin [name] is an 47 y.o. male who just moved here from [place]. 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 Was found to have a SDH with INR 5.9. INR/Coumadin has been reversed and he has stabilized from SDH. Neurosurgery recommends holding off any anticoagulation for at least several months, and given his history, pt would be at risk for recurrent DVT/PE while off anticoagulation. Therefor, IR is asked to place IVC filter. PMHx and meds reviewed.  Past Medical History:  Past Medical History  Diagnosis Date  . Pulmonary embolism   . Bipolar 1 disorder     Past Surgical History:  Past Surgical History  Procedure Laterality Date  . Appendectomy    . Cystectomy      right head    Family History: History reviewed. No pertinent family history.  Social History:  reports that he has never smoked. He does not have any smokeless tobacco history on file. He reports that he does not drink alcohol or use illicit drugs.  Allergies: No Known Allergies  Medications:   Medication List    ASK your doctor about these medications       lithium carbonate 450 MG CR tablet  Commonly known as:  ESKALITH  Take 450 mg by mouth every morning.     lithium 600 MG capsule  Take 600 mg by mouth at bedtime.     mirtazapine 45 MG tablet  Commonly known as:  REMERON  Take 45 mg by mouth at bedtime.     naproxen sodium 220 MG tablet  Commonly known as:  ANAPROX  Take 440 mg by mouth daily as needed (pain).     QUEtiapine 400 MG tablet  Commonly known as:  SEROQUEL  Take 800 mg by mouth at bedtime.     SINUS MEDICINE PO  Take 1 tablet by mouth daily as needed (congestion).     warfarin 5 MG tablet  Commonly known as:  COUMADIN  Take 5 mg by mouth 3 (three) times a week. Take on Monday, Wednesday, and Friday. Alternate with 7.5mg      warfarin 7.5 MG tablet  Commonly known as:  COUMADIN  Take 7.5 mg by mouth  4 (four) times a week. Take on Tuesday, Thursday, Saturday, and Sunday. Alternate with 5 mg.        Please HPI for pertinent positives, otherwise complete 10 system ROS negative.  Physical Exam: BP 99/72  Pulse 78  Temp(Src) 97.6 F (36.4 C) (Oral)  Resp 9  Ht 5\' 9"  (1.753 m)  Wt 230 lb 2.6 oz (104.4 kg)  BMI 33.97 kg/m2  SpO2 96% Body mass index is 33.97 kg/(m^2).   General Appearance:  Alert, cooperative, no distress, appears stated age  Head:  Normocephalic, without obvious abnormality, atraumatic  ENT: Unremarkable  Neck: Supple, symmetrical, trachea midline  Lungs:   Clear to auscultation bilaterally, no w/r/r  Chest Wall:  No tenderness or deformity  Heart:  Regular rate and rhythm, S1, S2 normal, no murmur, rub or gallop.  Abdomen:   Soft, non-tender, non distended.  Extremities: Extremities normal, atraumatic, no cyanosis or edema  Pulses: 2+ and symmetric  Neurologic: Normal affect, no gross deficits.   LABS:  BASIC METABOLIC PANEL     Status: Abnormal   Collection Time    01/05/13  6:20 AM      Result Value Range   Sodium 138  135 - 145  mEq/L   Potassium 3.8  3.5 - 5.1 mEq/L   Chloride 104  96 - 112 mEq/L   CO2 24  19 - 32 mEq/L   Glucose, Bld 110 (*) 70 - 99 mg/dL   BUN 9  6 - 23 mg/dL   Creatinine, Ser 1.61  0.50 - 1.35 mg/dL   Calcium 9.0  8.4 - 09.6 mg/dL   GFR calc non Af Amer 69 (*) >90 mL/min   GFR calc Af Amer 80 (*) >90 mL/min   Comment: (NOTE)     The eGFR has been calculated using the CKD EPI equation.     This calculation has not been validated in all clinical situations.     eGFR's persistently <90 mL/min signify possible Chronic Kidney     Disease.  CBC     Status: Abnormal   Collection Time    01/06/13  6:40 AM      Result Value Range   WBC 10.3  4.0 - 10.5 K/uL   RBC 4.28  4.22 - 5.81 MIL/uL   Hemoglobin 12.5 (*) 13.0 - 17.0 g/dL   HCT 04.5 (*) 40.9 - 81.1 %   MCV 84.3  78.0 - 100.0 fL   MCH 29.2  26.0 - 34.0 pg   MCHC 34.6  30.0  - 36.0 g/dL   RDW 91.4  78.2 - 95.6 %   Platelets 268  150 - 400 K/uL  PROTIME-INR     Status: None   Collection Time    01/06/13  6:40 AM      Result Value Range   Prothrombin Time 13.9  11.6 - 15.2 seconds   INR 1.09  0.00 - 1.49   Ct Head Wo Contrast  01/05/2013   *RADIOLOGY REPORT*  Clinical Data: Follow up subdural hematoma.  CT HEAD WITHOUT CONTRAST  Technique:  Contiguous axial images were obtained from the base of the skull through the vertex without contrast.  Comparison: CT of the head January 04, 2013 at 1136 hours.  Findings: 8 mm right holohemispheric subdural hematoma with hematocrit level consistent with degenerating blood products seen, similar in size.  3 mm hyperdense right parafalcine subdural hematoma in addition to small subdural hematoma along the right cerebellar tentorium.  9 mm of right to left subfalcine herniation persists with right lateral ventricular effacement, no hydrocephalous or definite entrapment.  Basal cisterns are patent.  No skull fracture.  Right paranasal sinusitis incompletely characterized with complete opacification of the right maxillary sinus, with slight central hyperdensity which could reflect a inspissated mucus or even superimposed fungal infection.  Included ocular globes and orbital contents are not suspicious though, not changed.  IMPRESSION: Evolving right holohemispheric subdural hematoma, measuring 8 mm today, with persistent 9 mm of right to left midline shift. Minimal right parafalcine and right cerebellar tentorium subdural hematoma, similar.  No hydrocephalous.   Original Report Authenticated By: Awilda Metro   Ct Head Wo Contrast  01/04/2013   CLINICAL DATA:  47 year old male with headache unresponsive to over the counter medication. History of blood clots, on Coumadin. Initial encounter.  EXAM: CT HEAD WITHOUT CONTRAST  TECHNIQUE: Contiguous axial images were obtained from the base of the skull through the vertex without intravenous  contrast.  COMPARISON:  None.  FINDINGS: Opacified right frontal, ethmoid, and maxillary sinuses. Evidence of periosteal thickening suggesting chronic sinusitis. Other Visualized paranasal sinuses and mastoids are clear. No acute osseous abnormality identified.  Visualized orbits and scalp soft tissues are within normal limits.  Mixed  density right subdural hematoma. Hyperdense blood products also along the interhemispheric fissure and tentorium. Mass effect on the right hemisphere with 9-10 mm of leftward midline shift. The subdural collection measures up to approximately 9 mm in thickness along much of the right convexity.  No ventriculomegaly. No evidence of cortically based acute infarction identified. Basilar cisterns remain patent. No suspicious intracranial vascular hyperdensity.  IMPRESSION: 1. Mixed density right subdural hematoma measuring up to about 9 mm in thickness with mass effect on the right hemisphere and associated 9-10 mm of leftward midline shift. Smaller volume parafalcine and tentorial blood.  2. No ventriculomegaly and basilar cisterns remain patent.  3. Chronic appearing right paranasal sinusitis.  Critical Value/emergent results were called by telephone at the time of interpretation on 01/04/2013 at 12:00 PM to [facility] , who verbally acknowledged these results.   Electronically Signed   By: Augusto Gamble M.D.   On: 01/04/2013 12:01    Assessment/Plan Subdural Hematoma. 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 Discussed that IVC filter could be retrievable if he is restarted on anticoagulation. Explained risks, complications, use of sedation. Labs reviewed. Consent signed in chart Pt ate breakfast, so will do procedure this afternoon.  Brayton El PA-C 01/06/2013, 11:26 AM

## 2013-01-06 NOTE — Progress Notes (Signed)
PULMONARY  / CRITICAL CARE MEDICINE  Name: Justin [name] MRN: [id number] DOB: 1965-05-09    ADMISSION DATE:  01/04/2013   REFERRING MD : EDP PRIMARY SERVICE: PCCM  CHIEF COMPLAINT:  headache  BRIEF PATIENT DESCRIPTION:  47 yo presented with headache, and self medicated with bufferin.  Found to have SDH.  Has hx of DVT/PE on chronic coumadin.  PCCM asked to admit to ICU.  SIGNIFICANT EVENTS: 10/19 Admit to ICU, neurosurgery consulted  STUDIES:  10/19 CT head>>>Rt SDH 9 mm thickness with 9-10 mm Lt shift 10/20 CT head>>>Rt SDH 8 mm thickness with 9 mm Lt shift   LINES / TUBES: PIV   CULTURES: Blood 10/19 >>  ANTIBIOTICS: 10-19 PO augmentin>>  SUBJECTIVE:  Patient resting comfortably and reports headache is considerably better this morning.       VITAL SIGNS: Temp:  [97.6 F (36.4 C)-99 F (37.2 C)] 97.6 F (36.4 C) (10/21 0700) Pulse Rate:  [79-102] 86 (10/21 0700) Resp:  [10-24] 14 (10/21 0700) BP: (102-147)/(60-99) 119/81 mmHg (10/21 0700) SpO2:  [92 %-100 %] 100 % (10/21 0700) Weight:  [230 lb 2.6 oz (104.4 kg)] 230 lb 2.6 oz (104.4 kg) (10/21 0500) Room air  INTAKE / OUTPUT: Intake/Output     10/20 0701 - 10/21 0700 10/21 0701 - 10/22 0700   P.O. 670    I.V. (mL/kg) 610 (5.8)    Blood     Total Intake(mL/kg) 1280 (12.3)    Urine (mL/kg/hr) 3550 (1.4)    Total Output 3550     Net -2270            PHYSICAL EXAMINATION: General:  WDWN male, resting comfortably.   Neuro:  PERLA, EOMi, CN II-XII grossly intact, speech and communication appropriate and clear.  Sensation to light touch intact.   HEENT:  Mild tenderness to palpation over the frontal sinus Cardiovascular:  RRR no M/R/G Lungs:  CTA A&P, regular effort Abdomen:  +BS x4 quadrants, soft, nontender Musculoskeletal:  Appropriate movement x 4 limbs, no gross deformities, no LE tenderness Skin:  Clean, warm, and dry  LABS:  CBC Recent Labs     01/04/13  1225  01/05/13  0620  01/06/13   0640  WBC  9.3  9.9  10.3  HGB  12.5*  11.6*  12.5*  HCT  36.8*  34.9*  36.1*  PLT  271  237  268   Coag's Recent Labs     01/04/13  1156  01/04/13  1225  01/04/13  2227  01/05/13  0620  01/06/13  0640  APTT   --   114*  33   --    --   INR  5.07*   --   1.29  1.21  1.09   BMET Recent Labs     01/04/13  1225  01/04/13  2227  01/05/13  0620  NA  141  141  138  K  4.4  3.7  3.8  CL  106  105  104  CO2  28  26  24   BUN  8  9  9   CREATININE  1.38*  1.23  1.21  GLUCOSE  101*  95  110*   Electrolytes Recent Labs     01/04/13  1225  01/04/13  2227  01/05/13  0620  CALCIUM  9.2  9.4  9.0  MG   --   2.2   --   PHOS   --   3.0   --  ABG Recent Labs     01/04/13  1437  PHART  7.392  PCO2ART  41.1  PO2ART  79.0*   Liver Enzymes Recent Labs     01/04/13  2227  AST  18  ALT  20  ALKPHOS  111  BILITOT  0.6  ALBUMIN  4.1   Imaging Ct Head Wo Contrast  01/05/2013   *RADIOLOGY REPORT*  Clinical Data: Follow up subdural hematoma.  CT HEAD WITHOUT CONTRAST  Technique:  Contiguous axial images were obtained from the base of the skull through the vertex without contrast.  Comparison: CT of the head January 04, 2013 at 1136 hours.  Findings: 8 mm right holohemispheric subdural hematoma with hematocrit level consistent with degenerating blood products seen, similar in size.  3 mm hyperdense right parafalcine subdural hematoma in addition to small subdural hematoma along the right cerebellar tentorium.  9 mm of right to left subfalcine herniation persists with right lateral ventricular effacement, no hydrocephalous or definite entrapment.  Basal cisterns are patent.  No skull fracture.  Right paranasal sinusitis incompletely characterized with complete opacification of the right maxillary sinus, with slight central hyperdensity which could reflect a inspissated mucus or even superimposed fungal infection.  Included ocular globes and orbital contents are not suspicious though,  not changed.  IMPRESSION: Evolving right holohemispheric subdural hematoma, measuring 8 mm today, with persistent 9 mm of right to left midline shift. Minimal right parafalcine and right cerebellar tentorium subdural hematoma, similar.  No hydrocephalous.   Original Report Authenticated By: Awilda Metro   Ct Head Wo Contrast  01/04/2013   CLINICAL DATA:  47 year old male with headache unresponsive to over the counter medication. History of blood clots, on Coumadin. Initial encounter.  EXAM: CT HEAD WITHOUT CONTRAST  TECHNIQUE: Contiguous axial images were obtained from the base of the skull through the vertex without intravenous contrast.  COMPARISON:  None.  FINDINGS: Opacified right frontal, ethmoid, and maxillary sinuses. Evidence of periosteal thickening suggesting chronic sinusitis. Other Visualized paranasal sinuses and mastoids are clear. No acute osseous abnormality identified.  Visualized orbits and scalp soft tissues are within normal limits.  Mixed density right subdural hematoma. Hyperdense blood products also along the interhemispheric fissure and tentorium. Mass effect on the right hemisphere with 9-10 mm of leftward midline shift. The subdural collection measures up to approximately 9 mm in thickness along much of the right convexity.  No ventriculomegaly. No evidence of cortically based acute infarction identified. Basilar cisterns remain patent. No suspicious intracranial vascular hyperdensity.  IMPRESSION: 1. Mixed density right subdural hematoma measuring up to about 9 mm in thickness with mass effect on the right hemisphere and associated 9-10 mm of leftward midline shift. Smaller volume parafalcine and tentorial blood.  2. No ventriculomegaly and basilar cisterns remain patent.  3. Chronic appearing right paranasal sinusitis.  Critical Value/emergent results were called by telephone at the time of interpretation on 01/04/2013 at 12:00 PM to [facility] , who verbally acknowledged  these results.   Electronically Signed   By: Augusto Gamble M.D.   On: 01/04/2013 12:01     ASSESSMENT / PLAN: A: SDH. 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 P: -consult IR to place IV filter -continue SCD's  A: Acute sinusitis P: -Day 3 augmentin -continue flonase, nasal irrigation -d/c afrin  A: Hx of Bipolar disease. P: -continue  lithium, remeron, seroquel  TODAY'S SUMMARY:  47 yo AAM admitted 10/19 for SDH with right subfalxian shift due to warfarin induced coagulopathy.  PT and INR have normalized and show no signs of rebound.  Records from [place] were obtained.  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  Patient has had a cardiac cath and an echo.  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  Patient will likely require an IVC filter based on history and spontaneous bleed on coumadin therapy.  Neurosurgery also agrees coumadin therapy will not be appropriate for the next several months, but is stable from a neurological standpoint at this time.    Will consult for IVC.  Will continue to treat sinus infection with PO augmentin x 10 days and Flonase.       Carley Hammed Physician Assistant Student 01/06/13, 8:05 am   Reviewed above, examined pt, and documentation changes made as needed.  Will arrange for IR to place IV filter >> d/w pt.  Coralyn Helling, MD [facility] Pulmonary/Critical Care 01/06/2013, 9:55 AM Pager:  [phone] After 3pm call: [phone]

## 2013-01-06 NOTE — Progress Notes (Signed)
Subjective: Patient without significant headache at this point. Rested well overnight.  Objective: Vital signs in last 24 hours: Filed Vitals:   01/06/13 0300 01/06/13 0400 01/06/13 0500 01/06/13 0600  BP: 127/93 108/71 116/86 122/81  Pulse: 98 92 88 86  Temp:      TempSrc:      Resp: 14 12 14 12   Height:      Weight:   104.4 kg (230 lb 2.6 oz)   SpO2: 95% 92% 96% 97%    Intake/Output from previous day: 10/20 0701 - 10/21 0700 In: 1260 [P.O.:670; I.V.:590] Out: 3550 [Urine:3550] Intake/Output this shift:    Physical Exam:  Awake, alert, fully oriented. Pupils equal round and reactive to light. EOM I. Facial movements symmetrical. Good strength in all 4 extremities. No drift of upper extremities.  CBC  Recent Labs  01/04/13 1225 01/05/13 0620  WBC 9.3 9.9  HGB 12.5* 11.6*  HCT 36.8* 34.9*  PLT 271 237   BMET  Recent Labs  01/04/13 2227 01/05/13 0620  NA 141 138  K 3.7 3.8  CL 105 104  CO2 26 24  GLUCOSE 95 110*  BUN 9 9  CREATININE 1.23 1.21  CALCIUM 9.4 9.0   ABG    Component Value Date/Time   PHART 7.392 01/04/2013 1437   PCO2ART 41.1 01/04/2013 1437   PO2ART 79.0* 01/04/2013 1437   HCO3 25.0* 01/04/2013 1437   TCO2 26 01/04/2013 1437   O2SAT 95.0 01/04/2013 1437    Studies/Results: Ct Head Wo Contrast  01/05/2013   *RADIOLOGY REPORT*  Clinical Data: Follow up subdural hematoma.  CT HEAD WITHOUT CONTRAST  Technique:  Contiguous axial images were obtained from the base of the skull through the vertex without contrast.  Comparison: CT of the head January 04, 2013 at 1136 hours.  Findings: 8 mm right holohemispheric subdural hematoma with hematocrit level consistent with degenerating blood products seen, similar in size.  3 mm hyperdense right parafalcine subdural hematoma in addition to small subdural hematoma along the right cerebellar tentorium.  9 mm of right to left subfalcine herniation persists with right lateral ventricular effacement, no  hydrocephalous or definite entrapment.  Basal cisterns are patent.  No skull fracture.  Right paranasal sinusitis incompletely characterized with complete opacification of the right maxillary sinus, with slight central hyperdensity which could reflect a inspissated mucus or even superimposed fungal infection.  Included ocular globes and orbital contents are not suspicious though, not changed.  IMPRESSION: Evolving right holohemispheric subdural hematoma, measuring 8 mm today, with persistent 9 mm of right to left midline shift. Minimal right parafalcine and right cerebellar tentorium subdural hematoma, similar.  No hydrocephalous.   Original Report Authenticated By: Awilda Metro   Ct Head Wo Contrast  01/04/2013   CLINICAL DATA:  47 year old male with headache unresponsive to over the counter medication. History of blood clots, on Coumadin. Initial encounter.  EXAM: CT HEAD WITHOUT CONTRAST  TECHNIQUE: Contiguous axial images were obtained from the base of the skull through the vertex without intravenous contrast.  COMPARISON:  None.  FINDINGS: Opacified right frontal, ethmoid, and maxillary sinuses. Evidence of periosteal thickening suggesting chronic sinusitis. Other Visualized paranasal sinuses and mastoids are clear. No acute osseous abnormality identified.  Visualized orbits and scalp soft tissues are within normal limits.  Mixed density right subdural hematoma. Hyperdense blood products also along the interhemispheric fissure and tentorium. Mass effect on the right hemisphere with 9-10 mm of leftward midline shift. The subdural collection measures up to approximately 9  mm in thickness along much of the right convexity.  No ventriculomegaly. No evidence of cortically based acute infarction identified. Basilar cisterns remain patent. No suspicious intracranial vascular hyperdensity.  IMPRESSION: 1. Mixed density right subdural hematoma measuring up to about 9 mm in thickness with mass effect on the right  hemisphere and associated 9-10 mm of leftward midline shift. Smaller volume parafalcine and tentorial blood.  2. No ventriculomegaly and basilar cisterns remain patent.  3. Chronic appearing right paranasal sinusitis.  Critical Value/emergent results were called by telephone at the time of interpretation on 01/04/2013 at 12:00 PM to [facility] , who verbally acknowledged these results.   Electronically Signed   By: Augusto Gamble M.D.   On: 01/04/2013 12:01    Assessment/Plan: Stable from a neurosurgical perspective. We'll plan on repeat CT brain without contrast in a couple of days, and if it remains stable then subsequent followup in my office regarding the subdural hematoma.  For now I do not anticipate the patient can be restarted on anticoagulation for at least several months. Critical care medicine to determine whether IVC filter indicated or not, and also to make arrangements for appropriate post discharge medical followup.   Hewitt Shorts, MD 01/06/2013, 7:11 AM

## 2013-01-07 DIAGNOSIS — R51 Headache: Secondary | ICD-10-CM | POA: Diagnosis present

## 2013-01-07 DIAGNOSIS — R519 Headache, unspecified: Secondary | ICD-10-CM | POA: Diagnosis present

## 2013-01-07 DIAGNOSIS — F319 Bipolar disorder, unspecified: Secondary | ICD-10-CM | POA: Diagnosis present

## 2013-01-07 DIAGNOSIS — R000 Tachycardia, unspecified: Secondary | ICD-10-CM | POA: Diagnosis not present

## 2013-01-07 MED ORDER — METOPROLOL TARTRATE 1 MG/ML IV SOLN: 5.0000 mg | Freq: Four times a day (QID) | INTRAVENOUS | Status: DC | PRN

## 2013-01-07 NOTE — Progress Notes (Signed)
PULMONARY  / CRITICAL CARE MEDICINE  Name: Justin [name] MRN: [id number] DOB: 1966/02/01    ADMISSION DATE:  01/04/2013   REFERRING MD : EDP PRIMARY SERVICE: PCCM  CHIEF COMPLAINT:  headache  BRIEF PATIENT DESCRIPTION:  47 yo presented with headache, and self medicated with bufferin.  Found to have SDH.  Has hx of DVT/PE on chronic coumadin.  PCCM asked to admit to ICU.  SIGNIFICANT EVENTS: 10/19 Admit to ICU, neurosurgery consulted 10/21 IVC filter placed by IR  STUDIES:  10/19 CT head>>>Rt SDH 9 mm thickness with 9-10 mm Lt shift 10/20 CT head>>>Rt SDH 8 mm thickness with 9 mm Lt shift  LINES / TUBES: PIV   CULTURES: Blood 10/19 >> Urine 10/19 >> negative  ANTIBIOTICS: 10-19 PO augmentin>>  SUBJECTIVE:  Had headache this AM >> better after meds.  Denies nausea, chest pain, dyspnea.  Tolerating diet.  No difficulty with walking in hall.  VITAL SIGNS: Temp:  [97.6 F (36.4 C)-99.2 F (37.3 C)] 97.6 F (36.4 C) (10/22 0700) Pulse Rate:  [75-108] 103 (10/22 0700) Resp:  [9-25] 12 (10/22 0700) BP: (99-152)/(72-105) 117/78 mmHg (10/22 0700) SpO2:  [87 %-99 %] 96 % (10/22 0700) Room air  INTAKE / OUTPUT: Intake/Output     10/21 0701 - 10/22 0700 10/22 0701 - 10/23 0700   P.O. 550    I.V. (mL/kg) 40 (0.4)    Total Intake(mL/kg) 590 (5.7)    Urine (mL/kg/hr) 2025 (0.8)    Total Output 2025     Net -1435            PHYSICAL EXAMINATION: General: No distress Neuro: Alert, normal strength HEENT: Pupils reactive, no sinus tenderness Cardiovascular: regular, tachycardic, no murmur Lungs: no wheeze Abdomen: soft, non tender Musculoskeletal: no edema Skin: no rashes  LABS:  CBC Recent Labs     01/04/13  1225  01/05/13  0620  01/06/13  0640  WBC  9.3  9.9  10.3  HGB  12.5*  11.6*  12.5*  HCT  36.8*  34.9*  36.1*  PLT  271  237  268   Coag's Recent Labs     01/04/13  1156  01/04/13  1225  01/04/13  2227  01/05/13  0620  01/06/13  0640  APTT    --   114*  33   --    --   INR  5.07*   --   1.29  1.21  1.09   BMET Recent Labs     01/04/13  1225  01/04/13  2227  01/05/13  0620  NA  141  141  138  K  4.4  3.7  3.8  CL  106  105  104  CO2  28  26  24   BUN  8  9  9   CREATININE  1.38*  1.23  1.21  GLUCOSE  101*  95  110*   Electrolytes Recent Labs     01/04/13  1225  01/04/13  2227  01/05/13  0620  CALCIUM  9.2  9.4  9.0  MG   --   2.2   --   PHOS   --   3.0   --    ABG Recent Labs     01/04/13  1437  PHART  7.392  PCO2ART  41.1  PO2ART  79.0*   Liver Enzymes Recent Labs     01/04/13  2227  AST  18  ALT  20  ALKPHOS  111  BILITOT  0.6  ALBUMIN  4.1   Imaging Ir Ivc Filter Plmt / S&i /img Guid/mod Sed  01/06/2013   CLINICAL DATA:  History of PE and on Coumadin. Subdural hematoma.  EXAM: IVC FILTER,INFERIOR VENA CAVOGRAM  MEDICATIONS AND MEDICAL HISTORY: Versed 2.0 mg, Fentanyl 100 mcg.  Additional Medications: None.  ANESTHESIA/SEDATION: Moderate sedation time: 15 minutes  CONTRAST:  40 cc Omnipaque 300  FLUOROSCOPY TIME:  1 min and 18 seconds.  PROCEDURE: The procedure, risks, benefits, and alternatives were explained to the patient. Questions regarding the procedure were encouraged and answered. The patient understands and consents to the procedure.  The right neck was prepped with Betadine in a sterile fashion, and a sterile drape was applied covering the operative field. A sterile gown and sterile gloves were used for the procedure.  The right internal jugular vein was noted to be patent initially with ultrasound. Under sonographic guidance, a micropuncture needle was inserted into the right internal jugular vein (Ultrasound image documentation was performed). It was removed over an 018 wire which was upsized to a [place]. The sheath was inserted over the wire and into the IVC. IVC venography was performed.  The temporary filter was then deployed in the infrarenal IVC. The sheath was removed and hemostasis was  achieved with direct pressure.  COMPLICATIONS: None  FINDINGS: IVC venography demonstrates no venous anomaly and no thrombus. Renal vein inflow occurs at mid L1.  The image demonstrates placement of an IVC filter with its tip at the the L1 inferior endplate.  IMPRESSION: Successful infrarenal IVC filter placement. This is a temporary filter. It can be removed or remain in place to become permanent.   Electronically Signed   By: Maryclare Bean M.D.   On: 01/06/2013 15:26     ASSESSMENT / PLAN: A: SDH. 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  S/p IVC filter 10/21. P: -continue SCD's  A: Acute sinusitis P: -Day 4 augmentin -continue flonase, nasal irrigation  A: Hx of Bipolar disease. P: -continue lithium, remeron, seroquel  A: Sinus tachycardia. P: -monitor on tele -even fluid balance -prn lopressor for HR > 130  Discussed with Dr. Jule Ser >> if f/u CT head 10/23 stable, then likely can d/c home on 10/23.  Keep in ICU pending results of f/u CT head.  Coralyn Helling, MD [facility] Pulmonary/Critical Care 01/07/2013, 8:24 AM Pager:  [phone] After 3pm call: [phone]

## 2013-01-07 NOTE — Progress Notes (Signed)
Subjective: Patient sitting up having breakfast. Without complaints.  Objective: Vital signs in last 24 hours: Filed Vitals:   01/07/13 0500 01/07/13 0600 01/07/13 0700 01/07/13 0800  BP: 109/76 112/74 117/78 114/84  Pulse: 103 100 103 104  Temp:   97.6 F (36.4 C)   TempSrc:   Oral   Resp: 14 14 12 13   Height:      Weight:      SpO2: 95% 94% 96% 93%    Intake/Output from previous day: 10/21 0701 - 10/22 0700 In: 590 [P.O.:550; I.V.:40] Out: 2025 [Urine:2025] Intake/Output this shift:    Physical Exam:  Awake alert, fully oriented. Moving all extremities well.  CBC  Recent Labs  01/05/13 0620 01/06/13 0640  WBC 9.9 10.3  HGB 11.6* 12.5*  HCT 34.9* 36.1*  PLT 237 268   BMET  Recent Labs  01/04/13 2227 01/05/13 0620  NA 141 138  K 3.7 3.8  CL 105 104  CO2 26 24  GLUCOSE 95 110*  BUN 9 9  CREATININE 1.23 1.21  CALCIUM 9.4 9.0   ABG    Component Value Date/Time   PHART 7.392 01/04/2013 1437   PCO2ART 41.1 01/04/2013 1437   PO2ART 79.0* 01/04/2013 1437   HCO3 25.0* 01/04/2013 1437   TCO2 26 01/04/2013 1437   O2SAT 95.0 01/04/2013 1437    Studies/Results: Ir Ivc Filter Plmt / S&i /img Guid/mod Sed  01/06/2013   CLINICAL DATA:  History of PE and on Coumadin. Subdural hematoma.  EXAM: IVC FILTER,INFERIOR VENA CAVOGRAM  MEDICATIONS AND MEDICAL HISTORY: Versed 2.0 mg, Fentanyl 100 mcg.  Additional Medications: None.  ANESTHESIA/SEDATION: Moderate sedation time: 15 minutes  CONTRAST:  40 cc Omnipaque 300  FLUOROSCOPY TIME:  1 min and 18 seconds.  PROCEDURE: The procedure, risks, benefits, and alternatives were explained to the patient. Questions regarding the procedure were encouraged and answered. The patient understands and consents to the procedure.  The right neck was prepped with Betadine in a sterile fashion, and a sterile drape was applied covering the operative field. A sterile gown and sterile gloves were used for the procedure.  The right internal  jugular vein was noted to be patent initially with ultrasound. Under sonographic guidance, a micropuncture needle was inserted into the right internal jugular vein (Ultrasound image documentation was performed). It was removed over an 018 wire which was upsized to a [place]. The sheath was inserted over the wire and into the IVC. IVC venography was performed.  The temporary filter was then deployed in the infrarenal IVC. The sheath was removed and hemostasis was achieved with direct pressure.  COMPLICATIONS: None  FINDINGS: IVC venography demonstrates no venous anomaly and no thrombus. Renal vein inflow occurs at mid L1.  The image demonstrates placement of an IVC filter with its tip at the the L1 inferior endplate.  IMPRESSION: Successful infrarenal IVC filter placement. This is a temporary filter. It can be removed or remain in place to become permanent.   Electronically Signed   By: Maryclare Bean M.D.   On: 01/06/2013 15:26    Assessment/Plan: Stable neurologically. We'll check CT brain without contrast in a.m. If stable radiologically and clinically, would be able to be discharged to home from neurosurgical perspective to follow with me in the office in a couple of weeks with CT the brain without contrast the date of the appointment. We'll need to continue off of Coumadin for at least several months. Case discussed with Dr. Coralyn Helling and nursing staff.  Hewitt Shorts, MD 01/07/2013, 8:49 AM

## 2013-01-08 ENCOUNTER — Encounter (HOSPITAL_COMMUNITY): Payer: Self-pay | Admitting: Radiology

## 2013-01-08 ENCOUNTER — Inpatient Hospital Stay (HOSPITAL_COMMUNITY): Payer: Medicaid Other

## 2013-01-08 DIAGNOSIS — F319 Bipolar disorder, unspecified: Secondary | ICD-10-CM

## 2013-01-08 LAB — CBC
Hemoglobin: 13.3 g/dL (ref 13.0–17.0)
Platelets: 282 10*3/uL (ref 150–400)
RBC: 4.64 MIL/uL (ref 4.22–5.81)
WBC: 10.6 10*3/uL — ABNORMAL HIGH (ref 4.0–10.5)

## 2013-01-08 LAB — BASIC METABOLIC PANEL
BUN: 11 mg/dL (ref 6–23)
Calcium: 9.3 mg/dL (ref 8.4–10.5)
Chloride: 100 mEq/L (ref 96–112)
GFR calc non Af Amer: 68 mL/min — ABNORMAL LOW (ref >90)
Glucose, Bld: 134 mg/dL — ABNORMAL HIGH (ref 70–99)
Potassium: 3.6 mEq/L (ref 3.5–5.1)
Sodium: 135 mEq/L (ref 135–145)

## 2013-01-08 MED ORDER — SALINE SPRAY 0.65 % NA SOLN: 1.0000 | Freq: Two times a day (BID) | NASAL | Status: DC

## 2013-01-08 MED ORDER — AMOXICILLIN-POT CLAVULANATE 875-125 MG PO TABS: 1.0000 | ORAL_TABLET | Freq: Two times a day (BID) | ORAL | Status: AC

## 2013-01-08 MED ORDER — FLUTICASONE PROPIONATE 50 MCG/ACT NA SUSP: 2.0000 | Freq: Every day | NASAL | Status: DC

## 2013-01-08 MED ORDER — HYDROCODONE-ACETAMINOPHEN 5-325 MG PO TABS: 1.0000 | ORAL_TABLET | ORAL | Status: DC | PRN

## 2013-01-08 NOTE — Progress Notes (Signed)
Pt given and explained discharge paperwork. Pt denies questions. Paperwork signed. Monitor and IV removed. Pt able to dress self. Awaiting volunteer service to wheel pt out.

## 2013-01-08 NOTE — Discharge Summary (Signed)
Physician Discharge Summary  Patient ID: Justin [name] MRN: [id number] DOB/AGE: [birth year] 47 y.o.  Admit date: 01/04/2013 Discharge date: 01/08/2013    Discharge Diagnoses:  Active Problems:   SDH (subdural hematoma)   H/O PE   Chronic anticoagulation   Warfarin-induced coagulopathy   Acute sinusitis   Bipolar disorder, unspecified   Sinus tachycardia   Headache(784.0)    Brief Summary: Justin [name] is a 47 y.o. y/o male with a PMH of DVT/PE on coumadin.  Presented 10/19 with headache, self medicated with bufferin.  Supratherapeutic INR (>5). Found to have R SDH with associated mass effect and shift and opacification of R maxillary and ethmoid sinuses.  Neurosurgery consulted and no need for surgical intervention was needed. His coagulapathy was reversed with Vit K and FFP.  IVC filter was placed by IR 10/21 as he will not be able to resume anticoagulation for several months.  He was treated conservatively, monitored with f/u CT and closely followed by neurosurgery. Also treated for acute sinusitis with augmentin, to continue for full 14 day course at d/c.  His mental status is excellent, awake, alert, appropriate, following commands and moving all extremities. F/u CT head on 10/23 was essentially unchanged and he is now ready for d/c home per neurosurgery, off anticoagulation with close outpt neurosurgical f/u.    SIGNIFICANT EVENTS:  10/19 Admit to ICU, neurosurgery consulted  10/21 IVC filter placed by IR   STUDIES:  10/19 CT head>>>Rt SDH 9 mm thickness with 9-10 mm Lt shift  10/20 CT head>>>Rt SDH 8 mm thickness with 9 mm Lt shift  10/23 CT head >>>Similar appearance of Rt SDH 9 mm with 1 mm shift   LINES / TUBES:  PIV   CULTURES:  Blood 10/19 >>  Urine 10/19 >> negative   ANTIBIOTICS:  10-19 PO augmentin>> (planned 14 days)                                                                     Discharge Plan by Discharge Diagnosis SDH -   - outpt f/u Dr. Newell Coral    Hx PE/DVT on chronic coumadin   - no coumadin for several months per neurosurgery (defer to outpt nsgy f/u for timing to resume any anticoagulation)  - s/p IVC filter 10/21  - PCP f/u 10/27  Acute sinusitis   - Augmentin x 14 days total   - cont flonase, nasal irrigation   Hx of Bipolar disease.  -continue lithium, remeron, seroquel   Filed Vitals:   01/08/13 0700 01/08/13 0748 01/08/13 0800 01/08/13 0900  BP: 114/87  119/85 105/82  Pulse: 88  92 104  Temp:  98 F (36.7 C)    TempSrc:  Oral    Resp:    17  Height:      Weight:      SpO2: 95%  95% 96%     Discharge Labs  BMET  Recent Labs Lab 01/04/13 1225 01/04/13 2227 01/05/13 0620 01/08/13 0500  NA 141 141 138 135  K 4.4 3.7 3.8 3.6  CL 106 105 104 100  CO2 28 26 24 23   GLUCOSE 101* 95 110* 134*  BUN 8 9 9 11   CREATININE 1.38* 1.23 1.21 1.22  CALCIUM 9.2 9.4  9.0 9.3  MG  --  2.2  --   --   PHOS  --  3.0  --   --      CBC   Recent Labs Lab 01/05/13 0620 01/06/13 0640 01/08/13 0500  HGB 11.6* 12.5* 13.3  HCT 34.9* 36.1* 39.3  WBC 9.9 10.3 10.6*  PLT 237 268 282   Anti-Coagulation  Recent Labs Lab 01/04/13 1156 01/04/13 2227 01/05/13 0620 01/06/13 0640  INR 5.07* 1.29 1.21 1.09            Follow-up Information   Follow up with Burtis Junes, MD On 01/12/2013. (11:00am )    Specialty:  General Surgery   Contact information:   [address] [facility] [address] [phone]       Follow up with Hewitt Shorts, MD In 2 weeks. (office will call you )    Specialty:  Neurosurgery   Contact information:   [address] [place] [address] [phone]          Medication List    STOP taking these medications       naproxen sodium 220 MG tablet  Commonly known as:  ANAPROX     warfarin 5 MG tablet  Commonly known as:  COUMADIN     warfarin 7.5 MG tablet  Commonly known as:  COUMADIN       TAKE these medications       amoxicillin-clavulanate 875-125 MG per tablet  Commonly known as:  AUGMENTIN  Take 1 tablet by mouth every 12 (twelve) hours.     fluticasone 50 MCG/ACT nasal spray  Commonly known as:  FLONASE  Place 2 sprays into the nose daily.     HYDROcodone-acetaminophen 5-325 MG per tablet  Commonly known as:  NORCO/VICODIN  Take 1-2 tablets by mouth every 4 (four) hours as needed for pain.     lithium carbonate 450 MG CR tablet  Commonly known as:  ESKALITH  Take 450 mg by mouth every morning.     lithium 600 MG capsule  Take 600 mg by mouth at bedtime.     mirtazapine 45 MG tablet  Commonly known as:  REMERON  Take 45 mg by mouth at bedtime.     QUEtiapine 400 MG tablet  Commonly known as:  SEROQUEL  Take 800 mg by mouth at bedtime.     SINUS MEDICINE PO  Take 1 tablet by mouth daily as needed (congestion).     sodium chloride 0.65 % Soln nasal spray  Commonly known as:  OCEAN  Place 1 spray into the nose 2 (two) times daily.          Disposition: Final discharge disposition not confirmed  Discharged Condition: Justin [name] has met maximum benefit of inpatient care and is medically stable and cleared for discharge.  Patient is pending follow up as above.      Time spent on disposition:  Greater than 35 minutes.   SignedDanford Bad, NP 01/08/2013  11:25 AM Pager: [phone] or [phone]  *Care during the described time interval was provided by me and/or other providers on the critical care team. I have reviewed this patient's available data, including medical history, events of note, physical examination and test results as part of my evaluation.      Coralyn Helling, MD [facility] Pulmonary/Critical Care 01/08/2013, 12:40 PM Pager:  [phone] After 3pm call: [phone]

## 2013-01-08 NOTE — Progress Notes (Signed)
Subjective: Patient with mild headache, overall stable, and certainly improved as compared to symptoms at presentation. He T. brain without contrast today shows no change in right hemispheric subdural hematoma. There remains mild mass effect and shift.  Objective: Vital signs in last 24 hours: Filed Vitals:   01/08/13 0700 01/08/13 0748 01/08/13 0800 01/08/13 0900  BP: 114/87  119/85 105/82  Pulse: 88  92 104  Temp:  98 F (36.7 C)    TempSrc:  Oral    Resp:    17  Height:      Weight:      SpO2: 95%  95% 96%    Intake/Output from previous day: 10/22 0701 - 10/23 0700 In: 720 [P.O.:720] Out: -  Intake/Output this shift: Total I/O In: 240 [P.O.:240] Out: -   Physical Exam:  Awake alert, fully oriented. EOMI. Facial movements symmetrical. Moving all 4 extremities well. No drift of upper extremity.  CBC  Recent Labs  01/06/13 0640 01/08/13 0500  WBC 10.3 10.6*  HGB 12.5* 13.3  HCT 36.1* 39.3  PLT 268 282   BMET  Recent Labs  01/08/13 0500  NA 135  K 3.6  CL 100  CO2 23  GLUCOSE 134*  BUN 11  CREATININE 1.22  CALCIUM 9.3    Studies/Results: Ct Head Wo Contrast  01/08/2013   CLINICAL DATA:  FOLLOW UP intracranial hemorrhage  EXAM: CT HEAD WITHOUT CONTRAST  TECHNIQUE: Contiguous axial images were obtained from the base of the skull through the vertex without intravenous contrast.  COMPARISON:  Prior CT from 01/05/2013  FINDINGS: Right-sided hole holohemispheric subdural collection is grossly stable measuring approximately 9 mm on today's examination (previously 8 mm). Hematocrit level is again seen dependently within this collection. Associated right-to-left subfalcine herniation is grossly stable measuring 1 cm, previously 9 mm). Ventricular size is stable without evidence of hydrocephalus. 3 mm hyperdense parafalcine subdural is again noted, unchanged. Blood again noted along the right tentorium, also stable.  No new hemorrhage identified. No intracranial infarct  identified.  Orbits are normal. Opacification of the right maxillary sinus with probable its tip is stated secretions and/or superimposed fungal elements again seen, unchanged.  Mastoid air cells are well pneumatized.  IMPRESSION: 1. Similar appearance of right holohemispheric subdural hematoma measuring 9 mm on today's examination with persistent 1 cm of right-to-left midline shift. No hydrocephalus. 2. Stable 3 mm right parafalcine hematoma with similar blood along the right cerebellar tentorium.   Electronically Signed   By: Rise Mu M.D.   On: 01/08/2013 06:24   Ir Ivc Filter Plmt / S&i /img Guid/mod Sed  01/06/2013   CLINICAL DATA:  History of PE and on Coumadin. Subdural hematoma.  EXAM: IVC FILTER,INFERIOR VENA CAVOGRAM  MEDICATIONS AND MEDICAL HISTORY: Versed 2.0 mg, Fentanyl 100 mcg.  Additional Medications: None.  ANESTHESIA/SEDATION: Moderate sedation time: 15 minutes  CONTRAST:  40 cc Omnipaque 300  FLUOROSCOPY TIME:  1 min and 18 seconds.  PROCEDURE: The procedure, risks, benefits, and alternatives were explained to the patient. Questions regarding the procedure were encouraged and answered. The patient understands and consents to the procedure.  The right neck was prepped with Betadine in a sterile fashion, and a sterile drape was applied covering the operative field. A sterile gown and sterile gloves were used for the procedure.  The right internal jugular vein was noted to be patent initially with ultrasound. Under sonographic guidance, a micropuncture needle was inserted into the right internal jugular vein (Ultrasound image documentation was performed).  It was removed over an 018 wire which was upsized to a [place]. The sheath was inserted over the wire and into the IVC. IVC venography was performed.  The temporary filter was then deployed in the infrarenal IVC. The sheath was removed and hemostasis was achieved with direct pressure.  COMPLICATIONS: None  FINDINGS: IVC venography  demonstrates no venous anomaly and no thrombus. Renal vein inflow occurs at mid L1.  The image demonstrates placement of an IVC filter with its tip at the the L1 inferior endplate.  IMPRESSION: Successful infrarenal IVC filter placement. This is a temporary filter. It can be removed or remain in place to become permanent.   Electronically Signed   By: Maryclare Bean M.D.   On: 01/06/2013 15:26    Assessment/Plan: Stable from a neurosurgical perspective. Will need to followup with me in 2 weeks in the office with a CT of the brain without contrast. Okay from a neurosurgical perspective for discharge to home. I've instructed him that if he were to have any significant neurologic difficulties were declined that he would need to return to the emergency room immediately for reevaluation. 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 Should remain off of Coumadin for at least several months.   Hewitt Shorts, MD 01/08/2013, 11:22 AM

## 2013-01-08 NOTE — Progress Notes (Signed)
PULMONARY  / CRITICAL CARE MEDICINE  Name: Justin [name] MRN: [id number] DOB: 05-10-1965    ADMISSION DATE:  01/04/2013   REFERRING MD : EDP PRIMARY SERVICE: PCCM  CHIEF COMPLAINT:  headache  BRIEF PATIENT DESCRIPTION:  47 yo presented with headache, and self medicated with bufferin.  Found to have SDH.  Has hx of DVT/PE on chronic coumadin.  PCCM asked to admit to ICU.  SIGNIFICANT EVENTS: 10/19 Admit to ICU, neurosurgery consulted 10/21 IVC filter placed by IR  STUDIES:  10/19 CT head>>>Rt SDH 9 mm thickness with 9-10 mm Lt shift 10/20 CT head>>>Rt SDH 8 mm thickness with 9 mm Lt shift 10/23 CT head >>>Similar appearance of Rt SDH 9 mm with 1 mm shift  LINES / TUBES: PIV   CULTURES: Blood 10/19 >> Urine 10/19 >> negative  ANTIBIOTICS: 10-19 PO augmentin>>  SUBJECTIVE:  Slept well overnight.  VITAL SIGNS: Temp:  [97.6 F (36.4 C)-98.2 F (36.8 C)] 98 F (36.7 C) (10/23 0748) Pulse Rate:  [73-131] 88 (10/23 0700) Resp:  [0-29] 17 (10/23 0400) BP: (109-145)/(70-104) 114/87 mmHg (10/23 0700) SpO2:  [93 %-98 %] 95 % (10/23 0700) Room air  INTAKE / OUTPUT: Intake/Output     10/22 0701 - 10/23 0700 10/23 0701 - 10/24 0700   P.O. 720    I.V. (mL/kg)     Total Intake(mL/kg) 720 (6.9)    Urine (mL/kg/hr)     Total Output       Net +720          Urine Occurrence 2 x      PHYSICAL EXAMINATION: General: No distress Neuro: Alert, normal strength HEENT: Pupils reactive, no sinus tenderness Cardiovascular: regular, no murmur Lungs: no wheeze Abdomen: soft, non tender Musculoskeletal: no edema Skin: no rashes  LABS:  CBC Recent Labs     01/06/13  0640  01/08/13  0500  WBC  10.3  10.6*  HGB  12.5*  13.3  HCT  36.1*  39.3  PLT  268  282   Coag's Recent Labs     01/06/13  0640  INR  1.09   BMET Recent Labs     01/08/13  0500  NA  135  K  3.6  CL  100  CO2  23  BUN  11  CREATININE  1.22  GLUCOSE  134*   Electrolytes Recent Labs   01/08/13  0500  CALCIUM  9.3   ABG No results found for this basename: PHART, PCO2ART, PO2ART,  in the last 72 hours Liver Enzymes No results found for this basename: AST, ALT, ALKPHOS, BILITOT, ALBUMIN,  in the last 72 hours Imaging Ct Head Wo Contrast  01/08/2013   CLINICAL DATA:  FOLLOW UP intracranial hemorrhage  EXAM: CT HEAD WITHOUT CONTRAST  TECHNIQUE: Contiguous axial images were obtained from the base of the skull through the vertex without intravenous contrast.  COMPARISON:  Prior CT from 01/05/2013  FINDINGS: Right-sided hole holohemispheric subdural collection is grossly stable measuring approximately 9 mm on today's examination (previously 8 mm). Hematocrit level is again seen dependently within this collection. Associated right-to-left subfalcine herniation is grossly stable measuring 1 cm, previously 9 mm). Ventricular size is stable without evidence of hydrocephalus. 3 mm hyperdense parafalcine subdural is again noted, unchanged. Blood again noted along the right tentorium, also stable.  No new hemorrhage identified. No intracranial infarct identified.  Orbits are normal. Opacification of the right maxillary sinus with probable its tip is stated secretions and/or superimposed fungal elements  again seen, unchanged.  Mastoid air cells are well pneumatized.  IMPRESSION: 1. Similar appearance of right holohemispheric subdural hematoma measuring 9 mm on today's examination with persistent 1 cm of right-to-left midline shift. No hydrocephalus. 2. Stable 3 mm right parafalcine hematoma with similar blood along the right cerebellar tentorium.   Electronically Signed   By: Rise Mu M.D.   On: 01/08/2013 06:24   Ir Ivc Filter Plmt / S&i /img Guid/mod Sed  01/06/2013   CLINICAL DATA:  History of PE and on Coumadin. Subdural hematoma.  EXAM: IVC FILTER,INFERIOR VENA CAVOGRAM  MEDICATIONS AND MEDICAL HISTORY: Versed 2.0 mg, Fentanyl 100 mcg.  Additional Medications: None.   ANESTHESIA/SEDATION: Moderate sedation time: 15 minutes  CONTRAST:  40 cc Omnipaque 300  FLUOROSCOPY TIME:  1 min and 18 seconds.  PROCEDURE: The procedure, risks, benefits, and alternatives were explained to the patient. Questions regarding the procedure were encouraged and answered. The patient understands and consents to the procedure.  The right neck was prepped with Betadine in a sterile fashion, and a sterile drape was applied covering the operative field. A sterile gown and sterile gloves were used for the procedure.  The right internal jugular vein was noted to be patent initially with ultrasound. Under sonographic guidance, a micropuncture needle was inserted into the right internal jugular vein (Ultrasound image documentation was performed). It was removed over an 018 wire which was upsized to a [place]. The sheath was inserted over the wire and into the IVC. IVC venography was performed.  The temporary filter was then deployed in the infrarenal IVC. The sheath was removed and hemostasis was achieved with direct pressure.  COMPLICATIONS: None  FINDINGS: IVC venography demonstrates no venous anomaly and no thrombus. Renal vein inflow occurs at mid L1.  The image demonstrates placement of an IVC filter with its tip at the the L1 inferior endplate.  IMPRESSION: Successful infrarenal IVC filter placement. This is a temporary filter. It can be removed or remain in place to become permanent.   Electronically Signed   By: Maryclare Bean M.D.   On: 01/06/2013 15:26     ASSESSMENT / PLAN: A: SDH. 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  S/p IVC filter 10/21. P: -continue SCD's -will need to re-assess whether he can go back on  anti-coagulation as outpt once cleared by neurosurgery  A: Acute sinusitis P: -Day 5/14 augmentin -continue flonase, nasal irrigation  A: Hx of Bipolar disease. P: -continue lithium, remeron, seroquel  A: Sinus tachycardia >> improved 10/23. P: -even fluid balance  Okay to d/c home 10/23 if okay with neurosurgery.  He is to follow up with his PCP within one week.  He does not need outpt pulmonary follow up.  Coralyn Helling, MD [facility] Pulmonary/Critical Care 01/08/2013, 8:08 AM Pager:  [phone] After 3pm call: [phone]

## 2013-01-11 LAB — CULTURE, BLOOD (ROUTINE X 2): Culture: NO GROWTH

## 2013-01-13 ENCOUNTER — Telehealth: Payer: Self-pay | Admitting: Internal Medicine

## 2013-01-13 NOTE — Telephone Encounter (Signed)
Pt is calling back

## 2013-01-13 NOTE — Telephone Encounter (Signed)
Patient requesting refill Hydrocodone 5-325 Last filled 01/08/2013 #20 Pt states that he has been having to take 1-2 (most of the time 2 tabs) every 4 hours for his HAs  Patient aware that if refilled, he will have to come pick up from our office--this cannot be called into pharmacy any longer.   Dr Marchelle Gearing, please advise on refill. Thanks.

## 2013-01-13 NOTE — Telephone Encounter (Signed)
Pt has never established in office was only seen in hospital. I advised that he needs to contact PCP. Carron Curie, CMA

## 2013-01-18 ENCOUNTER — Encounter (HOSPITAL_COMMUNITY): Payer: Self-pay | Admitting: Emergency Medicine

## 2013-01-18 ENCOUNTER — Emergency Department (HOSPITAL_COMMUNITY): Payer: Medicaid Other

## 2013-01-18 ENCOUNTER — Inpatient Hospital Stay (HOSPITAL_COMMUNITY)
Admission: EM | Admit: 2013-01-18 | Discharge: 2013-01-23 | DRG: 027 | Disposition: A | Discharge status: Home / Self Care | Payer: Medicaid Other | Attending: Neurosurgery | Admitting: Neurosurgery

## 2013-01-18 DIAGNOSIS — S065XAA Traumatic subdural hemorrhage with loss of consciousness status unknown, initial encounter: Secondary | ICD-10-CM

## 2013-01-18 DIAGNOSIS — Z86711 Personal history of pulmonary embolism: Secondary | ICD-10-CM

## 2013-01-18 DIAGNOSIS — F319 Bipolar disorder, unspecified: Secondary | ICD-10-CM | POA: Diagnosis present

## 2013-01-18 DIAGNOSIS — S065X9A Traumatic subdural hemorrhage with loss of consciousness of unspecified duration, initial encounter: Secondary | ICD-10-CM

## 2013-01-18 DIAGNOSIS — I6200 Nontraumatic subdural hemorrhage, unspecified: Principal | ICD-10-CM | POA: Diagnosis present

## 2013-01-18 HISTORY — DX: Traumatic subdural hemorrhage with loss of consciousness of unspecified duration, initial encounter: S06.5X9A

## 2013-01-18 LAB — POCT I-STAT, CHEM 8
BUN: 5 mg/dL — ABNORMAL LOW (ref 6–23)
Calcium, Ion: 1.22 mmol/L (ref 1.12–1.23)
Chloride: 103 mEq/L (ref 96–112)
Creatinine, Ser: 1.6 mg/dL — ABNORMAL HIGH (ref 0.50–1.35)
Glucose, Bld: 129 mg/dL — ABNORMAL HIGH (ref 70–99)
HCT: 41 % (ref 39.0–52.0)
Hemoglobin: 13.9 g/dL (ref 13.0–17.0)
Potassium: 3.4 mEq/L — ABNORMAL LOW (ref 3.5–5.1)
Sodium: 141 mEq/L (ref 135–145)
TCO2: 25 mmol/L (ref 0–100)

## 2013-01-18 LAB — PROTIME-INR
INR: 1.08 (ref 0.00–1.49)
Prothrombin Time: 13.8 seconds (ref 11.6–15.2)

## 2013-01-18 MED ORDER — SENNOSIDES-DOCUSATE SODIUM 8.6-50 MG PO TABS
1.0000 | ORAL_TABLET | Freq: Two times a day (BID) | ORAL | Status: DC
  Administered 2013-01-18 – 2013-01-23 (×10): 1 via ORAL
  Filled 2013-01-18 (×11): qty 1

## 2013-01-18 MED ORDER — ACETAMINOPHEN 650 MG RE SUPP: 650.0000 mg | RECTAL | Status: DC | PRN

## 2013-01-18 MED ORDER — PANTOPRAZOLE SODIUM 40 MG IV SOLR
40.0000 mg | Freq: Every day | INTRAVENOUS | Status: DC
  Administered 2013-01-18 – 2013-01-20 (×3): 40 mg via INTRAVENOUS
  Filled 2013-01-18 (×5): qty 40

## 2013-01-18 MED ORDER — ACETAMINOPHEN 325 MG PO TABS
650.0000 mg | ORAL_TABLET | ORAL | Status: DC | PRN
  Administered 2013-01-21 – 2013-01-22 (×2): 650 mg via ORAL
  Filled 2013-01-18 (×2): qty 2

## 2013-01-18 MED ORDER — LITHIUM CARBONATE 300 MG PO CAPS: 600.0000 mg | ORAL_CAPSULE | Freq: Every day | ORAL | Status: DC

## 2013-01-18 MED ORDER — HYDROMORPHONE HCL PF 1 MG/ML IJ SOLN
1.0000 mg | Freq: Once | INTRAMUSCULAR | Status: AC
  Administered 2013-01-18: 1 mg via INTRAMUSCULAR
  Filled 2013-01-18: qty 1

## 2013-01-18 MED ORDER — HYDROCODONE-ACETAMINOPHEN 5-325 MG PO TABS
2.0000 | ORAL_TABLET | Freq: Four times a day (QID) | ORAL | Status: DC | PRN
  Administered 2013-01-18 – 2013-01-22 (×9): 2 via ORAL
  Filled 2013-01-18: qty 1
  Filled 2013-01-18 (×2): qty 2
  Filled 2013-01-18: qty 1
  Filled 2013-01-18 (×6): qty 2

## 2013-01-18 MED ORDER — DEXAMETHASONE SODIUM PHOSPHATE 4 MG/ML IJ SOLN
4.0000 mg | Freq: Four times a day (QID) | INTRAMUSCULAR | Status: DC
Start: 2013-01-18 — End: 2013-01-19
  Administered 2013-01-18 – 2013-01-19 (×3): 4 mg via INTRAVENOUS
  Filled 2013-01-18 (×4): qty 1

## 2013-01-18 MED ORDER — ONDANSETRON HCL 4 MG/2ML IJ SOLN: 4.0000 mg | Freq: Four times a day (QID) | INTRAMUSCULAR | Status: DC | PRN

## 2013-01-18 MED ORDER — MORPHINE SULFATE 2 MG/ML IJ SOLN: 1.0000 mg | INTRAMUSCULAR | Status: DC | PRN

## 2013-01-18 MED ORDER — SODIUM CHLORIDE 0.9 % IV SOLN
INTRAVENOUS | Status: DC
  Administered 2013-01-18: 22:00:00 via INTRAVENOUS
  Administered 2013-01-19: 1000 mL via INTRAVENOUS

## 2013-01-18 MED ORDER — LITHIUM CARBONATE ER 300 MG PO TBCR
600.0000 mg | EXTENDED_RELEASE_TABLET | Freq: Every day | ORAL | Status: DC
  Administered 2013-01-18 – 2013-01-22 (×5): 600 mg via ORAL
  Filled 2013-01-18 (×7): qty 2

## 2013-01-18 MED ORDER — LABETALOL HCL 5 MG/ML IV SOLN
10.0000 mg | INTRAVENOUS | Status: DC | PRN
Start: 2013-01-18 — End: 2013-01-19

## 2013-01-18 MED ORDER — MIRTAZAPINE 45 MG PO TABS
45.0000 mg | ORAL_TABLET | Freq: Every day | ORAL | Status: DC
  Administered 2013-01-18 – 2013-01-22 (×5): 45 mg via ORAL
  Filled 2013-01-18 (×7): qty 1

## 2013-01-18 MED ORDER — QUETIAPINE FUMARATE 400 MG PO TABS
800.0000 mg | ORAL_TABLET | Freq: Every day | ORAL | Status: DC
  Administered 2013-01-18 – 2013-01-22 (×5): 800 mg via ORAL
  Filled 2013-01-18 (×7): qty 2

## 2013-01-18 MED ORDER — LITHIUM CARBONATE ER 450 MG PO TBCR
450.0000 mg | EXTENDED_RELEASE_TABLET | Freq: Every day | ORAL | Status: DC
  Administered 2013-01-19: 450 mg via ORAL
  Filled 2013-01-18: qty 1

## 2013-01-18 NOTE — ED Provider Notes (Signed)
CSN: [id number]     Arrival date & time 01/18/13  1653 History   First MD Initiated Contact with Patient 01/18/13 1705     Chief Complaint  Patient presents with  . Headache   (Consider location/radiation/quality/duration/timing/severity/associated sxs/prior Treatment) HPI  47 year old male with headache. Patient with recent admission for subdural hematoma and supratherapeutic INR. On Coumadin for history of pulmonary embolism. She had an IVC filter placed during this admission. He is feeling better upon discharge. Increasing headache in the past 2 days. Denies any trauma. Pain is right-sided and throbbing in nature. Constant. Mild photophobia, otherwise no neurological complaints. No acute numbness, tingling or loss of strength. No nausea or vomiting.  Past Medical History  Diagnosis Date  . Pulmonary embolism   . Bipolar 1 disorder   . Subdural hematoma    Past Surgical History  Procedure Laterality Date  . Appendectomy    . Cystectomy      right head   History reviewed. No pertinent family history. History  Substance Use Topics  . Smoking status: Never Smoker   . Smokeless tobacco: Not on file  . Alcohol Use: No    Review of Systems  All systems reviewed and negative, other than as noted in HPI.   Allergies  Review of patient's allergies indicates no known allergies.  Home Medications   Current Outpatient Rx  Name  Route  Sig  Dispense  Refill  . fluticasone (FLONASE) 50 MCG/ACT nasal spray   Nasal   Place 2 sprays into the nose daily.   16 g   0   . Homeopathic Products (SINUS MEDICINE PO)   Oral   Take 1 tablet by mouth daily as needed (congestion).         Marland Kitchen HYDROcodone-acetaminophen (NORCO/VICODIN) 5-325 MG per tablet   Oral   Take 1-2 tablets by mouth every 4 (four) hours as needed for pain.   20 tablet   0   . lithium 600 MG capsule   Oral   Take 600 mg by mouth at bedtime.         Marland Kitchen lithium carbonate (ESKALITH) 450 MG CR tablet   Oral  Take 450 mg by mouth every morning.         . mirtazapine (REMERON) 45 MG tablet   Oral   Take 45 mg by mouth at bedtime.         Marland Kitchen QUEtiapine (SEROQUEL) 400 MG tablet   Oral   Take 800 mg by mouth at bedtime.         . sodium chloride (OCEAN) 0.65 % SOLN nasal spray   Nasal   Place 1 spray into the nose 2 (two) times daily.      0    BP 132/55  Pulse 82  Temp(Src) 98.2 F (36.8 C) (Oral)  Resp 16  Ht 5\' 9"  (1.753 m)  Wt 229 lb 9 oz (104.129 kg)  BMI 33.89 kg/m2  SpO2 95% Physical Exam  Nursing note and vitals reviewed. Constitutional: He is oriented to person, place, and time. He appears well-developed and well-nourished. No distress.  HENT:  Head: Normocephalic and atraumatic.  Eyes: Conjunctivae are normal. Right eye exhibits no discharge. Left eye exhibits no discharge.  Neck: Neck supple.  Cardiovascular: Normal rate, regular rhythm and normal heart sounds.  Exam reveals no gallop and no friction rub.   No murmur heard. Pulmonary/Chest: Effort normal and breath sounds normal. No respiratory distress.  Abdominal: Soft. He exhibits no distension. There  is no tenderness.  Musculoskeletal: He exhibits no edema and no tenderness.  Neurological: He is alert and oriented to person, place, and time. No cranial nerve deficit. He exhibits normal muscle tone. Coordination normal.  Speech clear. Content is appropriate. Gait is steady.  Skin: Skin is warm and dry.  Psychiatric: He has a normal mood and affect. His behavior is normal. Thought content normal.    ED Course  Procedures (including critical care time) Labs Review Labs Reviewed  POCT I-STAT, CHEM 8 - Abnormal; Notable for the following:    Potassium 3.4 (*)    BUN 5 (*)    Creatinine, Ser 1.60 (*)    Glucose, Bld 129 (*)    All other components within normal limits  PROTIME-INR   Imaging Review No results found.   Ct Head Wo Contrast  01/18/2013   CLINICAL DATA:  Headaches  EXAM: CT HEAD WITHOUT  CONTRAST  TECHNIQUE: Contiguous axial images were obtained from the base of the skull through the vertex without intravenous contrast.  COMPARISON:  01/08/2013  FINDINGS: The bony calvarium is intact. Opacification of the right maxillary antrum and some of the right ethmoid sinuses is again identified. There again noted changes consistent with the right-sided subdural hematoma. It measure does 15 mm in greatest dimension in the frontal region. There is slight increase in the degree of mass-effect of approximately 14 mm. The changes along the falx are less well visualized on the current exam. No new focal hemorrhage is seen.  IMPRESSION: Persistent right subdural hematoma. It has increased slightly in the interval from the prior exam with increase in the degree of the midline shift from right the left. No definitive acute hemorrhage is noted known hematoma.   Electronically Signed   By: [name]   On: 01/18/2013 19:08  EKG Interpretation   None       MDM   1. SDH (subdural hematoma)     47 year old male with headache. Recent admission for subdural hematoma with a supratherapeutic INR. This is reversed. Coumadin was discontinued. IVC filter was placed. Patient returning now with two-day history of worsening headache. Denies any trauma. CT significant for increase in size of right-sided subdural hematoma with increased midline shift. No CT evidence of acute hemorrhage. Discussed with neurosurgery. Admission.     Raeford Razor, MD 01/22/13 [phone]

## 2013-01-18 NOTE — ED Notes (Addendum)
Pt reports headache returned  x3 days and new onset of trembling hands bilaterally and light sensitivity.  Hx of subdural hematomas several weeks ago, has since stopped coumadin.  Denies SOB, N/V, or vision changes.

## 2013-01-18 NOTE — H&P (Signed)
Reason for Consult:RSDH Referring Physician: EDP  Justin [name] is an 47 y.o. male.   HPI:  47 yo BM well known to DrNewell Justin [name] admitted with increasing headaches. Last seen 01/08/13 when he was discharged with Justin [name].  NO N/V. No N/T/W. No diplopia. Off anticoagulants.  Head CT showed slightly increased size of right Chronic [name] and he is admitted for further care.  Past Medical History  Diagnosis Date  . Pulmonary embolism   . Bipolar 1 disorder   . Subdural hematoma     Past Surgical History  Procedure Laterality Date  . Appendectomy    . Cystectomy      right head    No Known Allergies  History  Substance Use Topics  . Smoking status: Never Smoker   . Smokeless tobacco: Not on file  . Alcohol Use: No    History reviewed. No pertinent family history.   Review of Systems  Positive ROS: neg  All other systems have been reviewed and were otherwise negative with the exception of those mentioned in the HPI and as above.  Objective: Vital signs in last 24 hours: Temp:  [98 F (36.7 C)-98.2 F (36.8 C)] 98 F (36.7 C) (11/02 2018) Pulse Rate:  [76-86] 76 (11/02 2018) Resp:  [16-19] 19 (11/02 2018) BP: (132-144)/(55-98) 137/91 mmHg (11/02 2018) SpO2:  [95 %-98 %] 96 % (11/02 2018) Weight:  [104.129 kg (229 lb 9 oz)] 104.129 kg (229 lb 9 oz) (11/02 1659)  General Appearance: Alert, cooperative, no distress, appears stated age Head: Normocephalic, without obvious abnormality, atraumatic Eyes: PERRL, conjunctiva/corneas clear, EOM's intact    Neck: Supple, symmetrical, trachea midline Lungs:  respirations unlabored Heart: Regular rate and rhythm, Abdomen: Soft Extremities: Extremities normal, atraumatic, no cyanosis or edema Pulses: 2+ and symmetric all extremities Skin: Skin color, texture, turgor normal, no rashes or lesions  NEUROLOGIC:   Mental status: A&O x4, no aphasia, good attention span, Memory and fund of knowledge Motor Exam - grossly normal, normal  tone and bulk, no drift Sensory Exam - grossly normal Reflexes: symmetric, no pathologic reflexes, No Hoffman's, No clonus Coordination - grossly normal Gait - grossly normal Balance - grossly normal Cranial Nerves: I: smell Not tested  II: visual acuity  OS: na    OD: na  II: visual fields Full to confrontation  II: pupils Equal, round, reactive to light  III,VII: ptosis None  III,IV,VI: extraocular muscles  Full ROM  V: mastication Normal  V: facial light touch sensation  Normal  V,VII: corneal reflex  Present  VII: facial muscle function - upper  Normal  VII: facial muscle function - lower Normal  VIII: hearing Not tested  IX: soft palate elevation  Normal  IX,X: gag reflex Present  XI: trapezius strength  5/5  XI: sternocleidomastoid strength 5/5  XI: neck flexion strength  5/5  XII: tongue strength  Normal    Data Review Lab Results  Component Value Date   WBC 10.6* 01/08/2013   HGB 13.9 01/18/2013   HCT 41.0 01/18/2013   MCV 84.7 01/08/2013   PLT 282 01/08/2013   Lab Results  Component Value Date   NA 141 01/18/2013   K 3.4* 01/18/2013   CL 103 01/18/2013   CO2 23 01/08/2013   BUN 5* 01/18/2013   CREATININE 1.60* 01/18/2013   GLUCOSE 129* 01/18/2013   Lab Results  Component Value Date   INR 1.08 01/18/2013    Radiology: Ct Head Wo Contrast  01/18/2013  CLINICAL DATA:  Headaches  EXAM: CT HEAD WITHOUT CONTRAST  TECHNIQUE: Contiguous axial images were obtained from the base of the skull through the vertex without intravenous contrast.  COMPARISON:  01/08/2013  FINDINGS: The bony calvarium is intact. Opacification of the right maxillary antrum and some of the right ethmoid sinuses is again identified. There again noted changes consistent with the right-sided subdural hematoma. It measure does 15 mm in greatest dimension in the frontal region. There is slight increase in the degree of mass-effect of approximately 14 mm. The changes along the falx are less well visualized  on the current exam. No new focal hemorrhage is seen.  IMPRESSION: Persistent right subdural hematoma. It has increased slightly in the interval from the prior exam with increase in the degree of the midline shift from right the left. No definitive acute hemorrhage is noted known hematoma.   Electronically Signed   By: [name]   On: 01/18/2013 19:08   CT scan: as above  Assessment/Plan: Justin chronic [name] with headaches, but looks good clinically. Likely needs burr holes vs small craniotomy for evacuation, but not urgently. Will d/w Nudelman.   [name] 01/18/2013 9:18 PM

## 2013-01-18 NOTE — ED Notes (Signed)
Pt has hx of PE and was on coumadin. Was seen here on 10/18 for headaches and diagnosed with subdural hematoma, reports his headaches had improved when he was discharged but is now having severe headaches again for the past 3 days. Pt a&ox4 at triage, states he feels unsteady on his feet and left arm shaking when he tries to lift his glass.

## 2013-01-19 ENCOUNTER — Encounter (HOSPITAL_COMMUNITY): Payer: Medicaid Other | Admitting: Anesthesiology

## 2013-01-19 ENCOUNTER — Encounter (HOSPITAL_COMMUNITY)
Admission: EM | Disposition: A | Discharge status: Home / Self Care | Payer: Self-pay | Source: Home / Self Care | Attending: Neurosurgery

## 2013-01-19 ENCOUNTER — Encounter (HOSPITAL_COMMUNITY): Payer: Self-pay | Admitting: Anesthesiology

## 2013-01-19 ENCOUNTER — Inpatient Hospital Stay (HOSPITAL_COMMUNITY): Payer: Medicaid Other | Admitting: Anesthesiology

## 2013-01-19 HISTORY — PX: CRANIOTOMY: SHX93

## 2013-01-19 LAB — TYPE AND SCREEN
ABO/RH(D): A POS
Antibody Screen: NEGATIVE

## 2013-01-19 SURGERY — CRANIOTOMY HEMATOMA EVACUATION SUBDURAL
Anesthesia: General | Site: Head | Wound class: Clean

## 2013-01-19 MED ORDER — GLYCOPYRROLATE 0.2 MG/ML IJ SOLN
INTRAMUSCULAR | Status: DC | PRN
  Administered 2013-01-19: .8 mg via INTRAVENOUS

## 2013-01-19 MED ORDER — ARTIFICIAL TEARS OP OINT
TOPICAL_OINTMENT | OPHTHALMIC | Status: DC | PRN
  Administered 2013-01-19: 1 via OPHTHALMIC

## 2013-01-19 MED ORDER — THROMBIN 20000 UNITS EX SOLR
CUTANEOUS | Status: DC | PRN
  Administered 2013-01-19: 17:00:00 via TOPICAL

## 2013-01-19 MED ORDER — CEFAZOLIN SODIUM-DEXTROSE 2-3 GM-% IV SOLR
INTRAVENOUS | Status: AC
  Administered 2013-01-19: 2 g via INTRAVENOUS
  Filled 2013-01-19: qty 50

## 2013-01-19 MED ORDER — MICROFIBRILLAR COLL HEMOSTAT EX PADS
MEDICATED_PAD | CUTANEOUS | Status: DC | PRN
  Administered 2013-01-19: 1 via TOPICAL

## 2013-01-19 MED ORDER — BACITRACIN ZINC 500 UNIT/GM EX OINT
TOPICAL_OINTMENT | CUTANEOUS | Status: DC | PRN
  Administered 2013-01-19: 1 via TOPICAL

## 2013-01-19 MED ORDER — METHYLENE BLUE 1 % INJ SOLN
INTRAMUSCULAR | Status: DC | PRN
  Administered 2013-01-19: 1 mL

## 2013-01-19 MED ORDER — LIDOCAINE-EPINEPHRINE 1 %-1:100000 IJ SOLN
INTRAMUSCULAR | Status: DC | PRN
  Administered 2013-01-19: 10 mL

## 2013-01-19 MED ORDER — HYDRALAZINE HCL 20 MG/ML IJ SOLN: 5.0000 mg | INTRAMUSCULAR | Status: DC | PRN

## 2013-01-19 MED ORDER — LIDOCAINE HCL (CARDIAC) 20 MG/ML IV SOLN
INTRAVENOUS | Status: DC | PRN
  Administered 2013-01-19: 40 mg via INTRAVENOUS

## 2013-01-19 MED ORDER — MORPHINE SULFATE 2 MG/ML IJ SOLN
1.0000 mg | INTRAMUSCULAR | Status: DC | PRN
  Administered 2013-01-19 – 2013-01-20 (×2): 2 mg via INTRAVENOUS
  Filled 2013-01-19 (×2): qty 1

## 2013-01-19 MED ORDER — ROCURONIUM BROMIDE 100 MG/10ML IV SOLN
INTRAVENOUS | Status: DC | PRN
  Administered 2013-01-19: 50 mg via INTRAVENOUS

## 2013-01-19 MED ORDER — LITHIUM CARBONATE ER 450 MG PO TBCR
450.0000 mg | EXTENDED_RELEASE_TABLET | Freq: Two times a day (BID) | ORAL | Status: DC
  Administered 2013-01-20: 450 mg via ORAL
  Filled 2013-01-19 (×3): qty 1

## 2013-01-19 MED ORDER — SODIUM CHLORIDE 0.9 % IV SOLN
INTRAVENOUS | Status: DC | PRN
  Administered 2013-01-19 (×3): via INTRAVENOUS

## 2013-01-19 MED ORDER — METOCLOPRAMIDE HCL 5 MG/ML IJ SOLN
10.0000 mg | Freq: Once | INTRAMUSCULAR | Status: AC | PRN
  Filled 2013-01-19: qty 2

## 2013-01-19 MED ORDER — FENTANYL CITRATE 0.05 MG/ML IJ SOLN
INTRAMUSCULAR | Status: DC | PRN
  Administered 2013-01-19 (×2): 100 ug via INTRAVENOUS
  Administered 2013-01-19 (×2): 50 ug via INTRAVENOUS

## 2013-01-19 MED ORDER — FENTANYL CITRATE 0.05 MG/ML IJ SOLN: 25.0000 ug | INTRAMUSCULAR | Status: DC | PRN

## 2013-01-19 MED ORDER — VECURONIUM BROMIDE 10 MG IV SOLR
INTRAVENOUS | Status: DC | PRN
  Administered 2013-01-19 (×2): 2 mg via INTRAVENOUS

## 2013-01-19 MED ORDER — ONDANSETRON HCL 4 MG/2ML IJ SOLN
INTRAMUSCULAR | Status: DC | PRN
  Administered 2013-01-19: 4 mg via INTRAVENOUS

## 2013-01-19 MED ORDER — LABETALOL HCL 5 MG/ML IV SOLN: 5.0000 mg | INTRAVENOUS | Status: DC | PRN

## 2013-01-19 MED ORDER — LABETALOL HCL 5 MG/ML IV SOLN
INTRAVENOUS | Status: DC | PRN
  Administered 2013-01-19 (×3): 5 mg via INTRAVENOUS

## 2013-01-19 MED ORDER — SODIUM CHLORIDE 0.9 % IR SOLN
Status: DC | PRN
  Administered 2013-01-19: 17:00:00

## 2013-01-19 MED ORDER — NEOSTIGMINE METHYLSULFATE 1 MG/ML IJ SOLN
INTRAMUSCULAR | Status: DC | PRN
  Administered 2013-01-19: 5 mg via INTRAVENOUS

## 2013-01-19 MED ORDER — BUPIVACAINE HCL (PF) 0.25 % IJ SOLN
INTRAMUSCULAR | Status: DC | PRN
  Administered 2013-01-19: 10 mL

## 2013-01-19 MED ORDER — PROPOFOL 10 MG/ML IV BOLUS
INTRAVENOUS | Status: DC | PRN
  Administered 2013-01-19: 180 mg via INTRAVENOUS
  Administered 2013-01-19: 70 mg via INTRAVENOUS

## 2013-01-19 MED ORDER — 0.9 % SODIUM CHLORIDE (POUR BTL) OPTIME
TOPICAL | Status: DC | PRN
  Administered 2013-01-19 (×3): 1000 mL

## 2013-01-19 SURGICAL SUPPLY — 74 items
APPLICATOR COTTON TIP 6IN STRL (MISCELLANEOUS) ×2 IMPLANT
BAG DECANTER FOR FLEXI CONT (MISCELLANEOUS) ×2 IMPLANT
BANDAGE GAUZE 4  KLING STR (GAUZE/BANDAGES/DRESSINGS) IMPLANT
BANDAGE GAUZE ELAST BULKY 4 IN (GAUZE/BANDAGES/DRESSINGS) IMPLANT
BIT DRILL WIRE PASS 1.3MM (BIT) IMPLANT
BRUSH SCRUB EZ PLAIN DRY (MISCELLANEOUS) ×2 IMPLANT
BUR ACORN 6.0 PRECISION (BURR) ×2 IMPLANT
BUR ROUTER D-58 CRANI (BURR) ×2 IMPLANT
CANISTER SUCT 3000ML (MISCELLANEOUS) ×2 IMPLANT
CLIP TI MEDIUM 6 (CLIP) ×2 IMPLANT
CONT SPEC 4OZ CLIKSEAL STRL BL (MISCELLANEOUS) ×4 IMPLANT
CORDS BIPOLAR (ELECTRODE) ×2 IMPLANT
DRAIN JACKSON PRATT 10MM FLAT (MISCELLANEOUS) ×2 IMPLANT
DRAIN PENROSE 1/2X12 LTX STRL (WOUND CARE) IMPLANT
DRAIN SNY WOU 7FLT (WOUND CARE) IMPLANT
DRAPE NEUROLOGICAL W/INCISE (DRAPES) ×2 IMPLANT
DRAPE SURG 17X23 STRL (DRAPES) IMPLANT
DRAPE SURG IRRIG POUCH 19X23 (DRAPES) ×2 IMPLANT
DRAPE WARM FLUID 44X44 (DRAPE) ×2 IMPLANT
DRILL WIRE PASS 1.3MM (BIT)
DRSG ADAPTIC 3X8 NADH LF (GAUZE/BANDAGES/DRESSINGS) ×2 IMPLANT
DRSG PAD ABDOMINAL 8X10 ST (GAUZE/BANDAGES/DRESSINGS) IMPLANT
ELECT CAUTERY BLADE 6.4 (BLADE) ×2 IMPLANT
ELECT REM PT RETURN 9FT ADLT (ELECTROSURGICAL) ×2
ELECTRODE REM PT RTRN 9FT ADLT (ELECTROSURGICAL) ×1 IMPLANT
EVACUATOR SILICONE 100CC (DRAIN) ×2 IMPLANT
GAUZE SPONGE 4X4 16PLY XRAY LF (GAUZE/BANDAGES/DRESSINGS) IMPLANT
GLOVE BIOGEL PI IND STRL 7.5 (GLOVE) ×2 IMPLANT
GLOVE BIOGEL PI IND STRL 8 (GLOVE) ×2 IMPLANT
GLOVE BIOGEL PI INDICATOR 7.5 (GLOVE) ×2
GLOVE BIOGEL PI INDICATOR 8 (GLOVE) ×2
GLOVE ECLIPSE 7.0 STRL STRAW (GLOVE) ×2 IMPLANT
GLOVE ECLIPSE 7.5 STRL STRAW (GLOVE) ×10 IMPLANT
GLOVE EXAM NITRILE LRG STRL (GLOVE) IMPLANT
GLOVE EXAM NITRILE MD LF STRL (GLOVE) ×4 IMPLANT
GLOVE EXAM NITRILE XL STR (GLOVE) IMPLANT
GLOVE EXAM NITRILE XS STR PU (GLOVE) IMPLANT
GOWN BRE IMP SLV AUR LG STRL (GOWN DISPOSABLE) ×2 IMPLANT
GOWN BRE IMP SLV AUR XL STRL (GOWN DISPOSABLE) ×6 IMPLANT
GOWN STRL REIN 2XL LVL4 (GOWN DISPOSABLE) IMPLANT
HEMOSTAT SURGICEL 2X14 (HEMOSTASIS) ×2 IMPLANT
HOOK DURA (MISCELLANEOUS) ×2 IMPLANT
KIT BASIN OR (CUSTOM PROCEDURE TRAY) ×2 IMPLANT
KIT ROOM TURNOVER OR (KITS) ×2 IMPLANT
NEEDLE SPNL 22GX3.5 QUINCKE BK (NEEDLE) ×2 IMPLANT
NS IRRIG 1000ML POUR BTL (IV SOLUTION) ×2 IMPLANT
PACK CRANIOTOMY (CUSTOM PROCEDURE TRAY) ×2 IMPLANT
PAD ARMBOARD 7.5X6 YLW CONV (MISCELLANEOUS) ×6 IMPLANT
PATTIES SURGICAL .5 X.5 (GAUZE/BANDAGES/DRESSINGS) IMPLANT
PATTIES SURGICAL .5 X3 (DISPOSABLE) IMPLANT
PATTIES SURGICAL 1/4 X 3 (GAUZE/BANDAGES/DRESSINGS) IMPLANT
PATTIES SURGICAL 1X1 (DISPOSABLE) IMPLANT
PIN MAYFIELD SKULL DISP (PIN) ×2 IMPLANT
PLATE 1.5  2HOLE MED NEURO (Plate) ×1 IMPLANT
PLATE 1.5 2HOLE MED NEURO (Plate) ×1 IMPLANT
PLATE 1.5 5HOLE SQUARE (Plate) ×4 IMPLANT
SCREW SELF DRILL HT 1.5/4MM (Screw) ×20 IMPLANT
SPECIMEN JAR SMALL (MISCELLANEOUS) IMPLANT
SPONGE GAUZE 4X4 12PLY (GAUZE/BANDAGES/DRESSINGS) ×2 IMPLANT
SPONGE NEURO XRAY DETECT 1X3 (DISPOSABLE) ×2 IMPLANT
SPONGE SURGIFOAM ABS GEL 100 (HEMOSTASIS) ×2 IMPLANT
STAPLER SKIN PROX WIDE 3.9 (STAPLE) ×2 IMPLANT
SUT ETHILON 3 0 FSL (SUTURE) IMPLANT
SUT NURALON 4 0 TR CR/8 (SUTURE) ×6 IMPLANT
SUT VIC AB 2-0 CP2 18 (SUTURE) ×4 IMPLANT
SYR 20ML ECCENTRIC (SYRINGE) ×2 IMPLANT
SYR CONTROL 10ML LL (SYRINGE) ×2 IMPLANT
TAPE CLOTH SURG 4X10 WHT LF (GAUZE/BANDAGES/DRESSINGS) ×2 IMPLANT
TOWEL OR 17X24 6PK STRL BLUE (TOWEL DISPOSABLE) ×2 IMPLANT
TOWEL OR 17X26 10 PK STRL BLUE (TOWEL DISPOSABLE) ×2 IMPLANT
TRAP SPECIMEN MUCOUS 40CC (MISCELLANEOUS) IMPLANT
TRAY FOLEY CATH 16FRSI W/METER (SET/KITS/TRAYS/PACK) ×2 IMPLANT
UNDERPAD 30X30 INCONTINENT (UNDERPADS AND DIAPERS) IMPLANT
WATER STERILE IRR 1000ML POUR (IV SOLUTION) ×2 IMPLANT

## 2013-01-19 NOTE — Progress Notes (Signed)
UR complete.  Mylea Roarty RN, MSN 

## 2013-01-19 NOTE — Anesthesia Postprocedure Evaluation (Signed)
Anesthesia Post Note  Patient: Justin [name]  Procedure(s) Performed: Procedure(s) (LRB): CRANIOTOMY HEMATOMA EVACUATION SUBDURAL (N/A)  Anesthesia type: general  Patient location: PACU  Post pain: Pain level controlled  Post assessment: Patient's Cardiovascular Status Stable  Last Vitals:  Filed Vitals:   01/19/13 1919  BP:   Pulse:   Temp: 36.7 C  Resp:     Post vital signs: Reviewed and stable  Level of consciousness: sedated  Complications: No apparent anesthesia complications

## 2013-01-19 NOTE — Progress Notes (Signed)
Subjective: Patient be admitted by Dr. Yetta Barre last night because of increased headaches. Patient explains that he's been having increased headaches over the past 3 or 4 days. They're located over the right hemicranium.  Objective: Vital signs in last 24 hours: Filed Vitals:   01/18/13 2018 01/19/13 0622 01/19/13 1050 01/19/13 1423  BP: 137/91 132/86 140/93 133/88  Pulse: 76 110 84 86  Temp: 98 F (36.7 C) 97.8 F (36.6 C) 98 F (36.7 C) 98.6 F (37 C)  TempSrc: Oral Oral Oral Oral  Resp: 19 20 18 18   Height:      Weight:      SpO2: 96% 98% 100% 95%    Intake/Output from previous day:   Intake/Output this shift:    Physical Exam:  Awake alert, oriented. Following commands with all 4 extremities. Moving all extremities well, no drift of upper extremities.  CBC  Recent Labs  01/18/13 1728  HGB 13.9  HCT 41.0   BMET  Recent Labs  01/18/13 1728  NA 141  K 3.4*  CL 103  GLUCOSE 129*  BUN 5*  CREATININE 1.60*    Studies/Results: Ct Head Wo Contrast  01/18/2013   CLINICAL DATA:  Headaches  EXAM: CT HEAD WITHOUT CONTRAST  TECHNIQUE: Contiguous axial images were obtained from the base of the skull through the vertex without intravenous contrast.  COMPARISON:  01/08/2013  FINDINGS: The bony calvarium is intact. Opacification of the right maxillary antrum and some of the right ethmoid sinuses is again identified. There again noted changes consistent with the right-sided subdural hematoma. It measure does 15 mm in greatest dimension in the frontal region. There is slight increase in the degree of mass-effect of approximately 14 mm. The changes along the falx are less well visualized on the current exam. No new focal hemorrhage is seen.  IMPRESSION: Persistent right subdural hematoma. It has increased slightly in the interval from the prior exam with increase in the degree of the midline shift from right the left. No definitive acute hemorrhage is noted known hematoma.    Electronically Signed   By: [name]   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 Discussed alternatives of continued observation and symptomatic management versus craniotomy for evacuation of the subdural hematoma. We discussed the nature of surgery and its risks including risks of infection, bleeding, possibly for transfusion, the risk of brain dysfunction including paralysis, stroke, coma, and death, the risk of reaccumulation of the subdural hematoma and possible need for reoperation, and anesthetic risks of myocardial function, stroke, pneumonia, and death. After discussing all this the patient wished to proceed with surgery, arrangements are being made with the operating room.   Hewitt Shorts, MD 01/19/2013, 3:27 PM

## 2013-01-19 NOTE — Anesthesia Preprocedure Evaluation (Addendum)
Anesthesia Evaluation  Patient identified by MRN, date of birth, ID band Patient awake    Reviewed: Allergy & Precautions, H&P , NPO status , Patient's Chart, lab work & pertinent test results, reviewed documented beta blocker date and time   Airway Mallampati: II TM Distance: >3 FB Neck ROM: full    Dental  (+) Teeth Intact and Dental Advisory Given   Pulmonary  breath sounds clear to auscultation        Cardiovascular negative cardio ROS  Rhythm:regular     Neuro/Psych  Headaches, PSYCHIATRIC DISORDERS Bipolar Disorder negative neurological ROS  negative psych ROS   GI/Hepatic negative GI ROS, Neg liver ROS,   Endo/Other  negative endocrine ROS  Renal/GU negative Renal ROS  negative genitourinary   Musculoskeletal   Abdominal   Peds  Hematology negative hematology ROS (+)   Anesthesia Other Findings See surgeon's H&P   Reproductive/Obstetrics negative OB ROS                          Anesthesia Physical Anesthesia Plan  ASA: III and emergent  Anesthesia Plan: General   Post-op Pain Management:    Induction: Intravenous  Airway Management Planned: Oral ETT  Additional Equipment: Arterial line  Intra-op Plan:   Post-operative Plan: Extubation in OR  Informed Consent: I have reviewed the patients History and Physical, chart, labs and discussed the procedure including the risks, benefits and alternatives for the proposed anesthesia with the patient or authorized representative who has indicated his/her understanding and acceptance.   Dental Advisory Given  Plan Discussed with: CRNA, Surgeon and Anesthesiologist  Anesthesia Plan Comments:       Anesthesia Quick Evaluation

## 2013-01-19 NOTE — Progress Notes (Signed)
Spoke with Dr. Newell Coral regarding patient's NPO status.  Patient is requesting to eat.  Dr. Newell Coral promised to see the patient after he finishes his current case.  He wants patient to remain NPO for now. Dr. Edilia Bo that patient was receiving IV hydration.   Patient has been informed and will wait for the Dr.  Theola Sequin will continue to monitor.  Lance Bosch, RN

## 2013-01-19 NOTE — Anesthesia Procedure Notes (Signed)
Procedure Name: Intubation Date/Time: 01/19/2013 5:31 PM Performed by: De Nurse Pre-anesthesia Checklist: Patient identified, Emergency Drugs available, Suction available, Patient being monitored and Timeout performed Patient Re-evaluated:Patient Re-evaluated prior to inductionOxygen Delivery Method: Circle system utilized Preoxygenation: Pre-oxygenation with 100% oxygen Intubation Type: IV induction Ventilation: Mask ventilation without difficulty and Oral airway inserted - appropriate to patient size Laryngoscope Size: Mac and 4 Grade View: Grade II Tube type: Oral Tube size: 7.5 mm Number of attempts: 1 Airway Equipment and Method: Stylet Placement Confirmation: ETT inserted through vocal cords under direct vision,  positive ETCO2 and breath sounds checked- equal and bilateral Secured at: 23 cm Tube secured with: Tape Dental Injury: Teeth and Oropharynx as per pre-operative assessment

## 2013-01-19 NOTE — Transfer of Care (Signed)
Immediate Anesthesia Transfer of Care Note  Patient: Justin [name]  Procedure(s) Performed: Procedure(s): CRANIOTOMY HEMATOMA EVACUATION SUBDURAL (N/A)  Patient Location: NICU  Anesthesia Type:General  Level of Consciousness: sedated  Airway & Oxygen Therapy: Patient Spontanous Breathing and Patient connected to face mask oxygen  Post-op Assessment: Report given to PACU RN and Post -op Vital signs reviewed and stable  Post vital signs: Reviewed and stable  Complications: No apparent anesthesia complications

## 2013-01-19 NOTE — Preoperative (Signed)
Beta Blockers   Reason not to administer Beta Blockers:Not Applicable 

## 2013-01-19 NOTE — Op Note (Signed)
01/18/2013 - 01/19/2013  7:20 PM  PATIENT:  Justin [name]  47 y.o. male  PRE-OPERATIVE DIAGNOSIS:  Chronic Subdural Hematoma  POST-OPERATIVE DIAGNOSIS:  Chronic Subdural Hematoma  PROCEDURE:  Procedure(s): CRANIOTOMY HEMATOMA EVACUATION SUBDURAL:  Right frontal parietal craniotomy and evacuation of subdural hematoma  SURGEON:  Surgeon(s): Hewitt Shorts, MD Lisbeth Renshaw, MD  ASSISTANTS:  Lisbeth Renshaw, MD   ANESTHESIA:   general  EBL:  Total I/O In: 1000 [I.V.:1000] Out: -   BLOOD ADMINISTERED:none  COUNT: Correct per nursing staff  DICTATION: Patient brought the operating room, placed under general endotracheal anesthesia. Patient was placed in a 3 pin Mayfield head holder, a roll was placed behind the right shoulder, and the patient and his head and neck were gently turned towards the left. The scalp was shaved with clippers, and prepped with Betadine soap and solution and draped in a sterile fashion. A straight parasagittal incision was made from the right frontal boss to the right parietal boss. The line of the incision was infiltrated with local site with epinephrine prior to skin incision. Raney clips were applied for scalp edge is to maintain hemostasis. Self retaining retractors are placed and a single bur hole was placed at the inferior aspect of the exposure. The dura was dissected from the overlying skull, and using the craniotome a bone flap was turned. The dura tacked up around the margins of the craniotomy with 4-0 Nurolon suture. The dura was opened in a U-shaped fashion and hinged towards the midline. The subdural hematoma and its parietal and visceral membranes were identified. The subdural collection had a brownish chronic subdural appearance. It was gently irrigated from the surface the brain with warm saline, until clear. Edges of the dura and the edges of the subdural membranes were coagulated with bipolar cautery to maintain hemostasis. The brain filled  back into the space left after the hematoma was evacuated. Once the subdural irrigation was clear we proceeded with closure. We did not place a subdural drain because there is a little residual subdural space. The dura was closed with interrupted 4-0 Nurolon sutures. The bone flap was tacked up to the bone flap in 2 separate locations with 4-0 Nurolon sutures. The bone flap was secured to the margins the craniotomy with a variety of Lorenz cranial plates. The scalp was closed in multiple layers. The galea was closed with interrupted inverted 2-0 Vicryl sutures, and the skin is approximate surgical staples. The was dressed with Adaptic and sterile gauze and 4 inch Hypafix. Following surgery the patient was taken out of the 3 pin Mayfield head holder, be reversed an anesthetic, and transferred to the intensive care unit for further care.  PLAN OF CARE: Admit to inpatient   PATIENT DISPOSITION:  ICU - extubated and stable.   Delay start of Pharmacological VTE agent (>24hrs) due to surgical blood loss or risk of bleeding:  yes

## 2013-01-20 ENCOUNTER — Encounter (HOSPITAL_COMMUNITY): Payer: Self-pay | Admitting: Neurosurgery

## 2013-01-20 LAB — BASIC METABOLIC PANEL
BUN: 10 mg/dL (ref 6–23)
CO2: 23 mEq/L (ref 19–32)
Calcium: 8.6 mg/dL (ref 8.4–10.5)
Chloride: 106 mEq/L (ref 96–112)
Creatinine, Ser: 1.06 mg/dL (ref 0.50–1.35)
GFR calc Af Amer: >90 mL/min (ref >90)
GFR calc non Af Amer: 81 mL/min — ABNORMAL LOW (ref >90)
Glucose, Bld: 139 mg/dL — ABNORMAL HIGH (ref 70–99)
Potassium: 3.7 mEq/L (ref 3.5–5.1)
Sodium: 139 mEq/L (ref 135–145)

## 2013-01-20 MED ORDER — LEVETIRACETAM 500 MG PO TABS
500.0000 mg | ORAL_TABLET | Freq: Two times a day (BID) | ORAL | Status: DC
  Administered 2013-01-20 – 2013-01-23 (×6): 500 mg via ORAL
  Filled 2013-01-20 (×7): qty 1

## 2013-01-20 MED ORDER — HYDROXYZINE HCL 25 MG PO TABS
50.0000 mg | ORAL_TABLET | ORAL | Status: DC | PRN
  Filled 2013-01-20: qty 1

## 2013-01-20 MED ORDER — LITHIUM CARBONATE ER 450 MG PO TBCR
450.0000 mg | EXTENDED_RELEASE_TABLET | Freq: Every morning | ORAL | Status: DC
  Administered 2013-01-21 – 2013-01-23 (×3): 450 mg via ORAL
  Filled 2013-01-20 (×3): qty 1

## 2013-01-20 NOTE — Plan of Care (Signed)
Problem: Consults Goal: Diagnosis - Craniotomy Outcome: Completed/Met Date Met:  01/20/13 Subdural hematoma

## 2013-01-20 NOTE — Progress Notes (Signed)
Subjective: Patient with some incisional discomfort, but otherwise without complaints. Denies nausea.  Objective: Vital signs in last 24 hours: Filed Vitals:   01/20/13 0412 01/20/13 0500 01/20/13 0600 01/20/13 0700  BP:  136/85 136/84 134/84  Pulse:  74 79 85  Temp: 98.6 F (37 C)     TempSrc: Oral     Resp:  13 14 19   Height:      Weight:      SpO2:  98% 98% 95%    Intake/Output from previous day: 11/03 0701 - 11/04 0700 In: 2087.5 [I.V.:2087.5] Out: 2300 [Urine:2200; Blood:100] Intake/Output this shift: Total I/O In: -  Out: 550 [Urine:550]  Physical Exam:  Dressing dry and intact. Awake alert, oriented. Following commands. Moving all 4 extremities well.  Assessment/Plan: We'll begin to mobilize, advanced to regular diet, DC Aline, DC Foley, check BMET, adjust his lithium dose to 450 in the morning and 600 at night (his outpatient dosed prior to admission).   Hewitt Shorts, MD 01/20/2013, 8:22 AM

## 2013-01-20 NOTE — Progress Notes (Addendum)
1830 nursing note  Pt reported that his arms have jerked a few times, starting yesterday, denied repetitive movements and pt did not think it was associated with being cold. RN witnessed it once at the bedside at that time, one jerk BUEs, not appearing to be any type of seizure activity. Pt also has at times had "shaky" muscle movements when testing strength in BUE & BLE. Pt reports he thinks this started yesterday as well. Pt's strength good & equal in BUE & BLE. Pt does report being cold at this time and is slightly shivering, warm blanket applied. MD paged to make aware. Will continue to monitor.    Holly Bodily   18:48 MD paged again.   18:57 Dr. Venetia Maxon made aware, order received to start keppra.

## 2013-01-21 MED ORDER — SODIUM CHLORIDE 0.9 % IV BOLUS (SEPSIS)
500.0000 mL | Freq: Once | INTRAVENOUS | Status: AC
  Administered 2013-01-21: 500 mL via INTRAVENOUS

## 2013-01-21 MED ORDER — PANTOPRAZOLE SODIUM 40 MG PO TBEC
40.0000 mg | DELAYED_RELEASE_TABLET | Freq: Every day | ORAL | Status: DC
  Administered 2013-01-21 – 2013-01-23 (×3): 40 mg via ORAL
  Filled 2013-01-21 (×2): qty 1

## 2013-01-21 NOTE — Progress Notes (Signed)
Pt noted to have increasing sinus tachycardia for the past 45 minutes with rate onset 110, increasing to 140s with SBP decreasing from 140-100.  Pt denies pain at this time. Neuro intact. IVF at 30 cc/hr.  Voiding large amounts.  Dr Venetia Maxon notified of same.  NS 500 cc bolus ordered and infusing.  Will continue to monitor.

## 2013-01-21 NOTE — Progress Notes (Signed)
Subjective: Patient comfortable, headache much improved as compared to prior surgery. Had some tachycardia earlier this morning, responded to fluid boluses, and heart rate now 90. Has been up and ambulating in ICU.  Objective: Vital signs in last 24 hours: Filed Vitals:   01/21/13 0730 01/21/13 0800 01/21/13 0830 01/21/13 0900  BP: 136/89 118/77 124/81 140/91  Pulse: 116 61 110 108  Temp:      TempSrc:      Resp: 23 25 16 13   Height:      Weight:      SpO2: 98% 84% 99% 99%    Intake/Output from previous day: 11/04 0701 - 11/05 0700 In: 3215 [P.O.:1520; I.V.:695; IV Piggyback:1000] Out: 4300 [Urine:4300] Intake/Output this shift: Total I/O In: 390 [P.O.:360; I.V.:30] Out: 1100 [Urine:1100]  Physical Exam:  Awake alert, oriented. Following commands. Moving all 4 extremities well. Dressing removed, wound clean and dry, left open the air.  CBC  Recent Labs  01/18/13 1728  HGB 13.9  HCT 41.0   BMET  Recent Labs  01/18/13 1728 01/20/13 0815  NA 141 139  K 3.4* 3.7  CL 103 106  CO2  --  23  GLUCOSE 129* 139*  BUN 5* 10  CREATININE 1.60* 1.06  CALCIUM  --  8.6    Assessment/Plan: We'll transfer to 4 N.  Will check CT brain without contrast in a.m.   Hewitt Shorts, MD 01/21/2013, 10:15 AM

## 2013-01-21 NOTE — Progress Notes (Signed)
Followup  Note:  Pt temporarily responded to 500 cc NS bolus with HR down to 120 from 140's and SBP 119.  HR slowly crept back up to 130s ST.  Dr Paulina Fusi, critical care consulted and a second 500 cc NS bolus ordered and infusing.  Will continue to monitor

## 2013-01-21 NOTE — Progress Notes (Signed)
Pt stated his wallet and other belongings were sent home with his sister.

## 2013-01-22 ENCOUNTER — Inpatient Hospital Stay (HOSPITAL_COMMUNITY): Payer: Medicaid Other

## 2013-01-22 LAB — GLUCOSE, CAPILLARY: Glucose-Capillary: 104 mg/dL — ABNORMAL HIGH (ref 70–99)

## 2013-01-22 NOTE — Progress Notes (Signed)
UR complete.  Natassja Ollis RN, MSN 

## 2013-01-22 NOTE — Progress Notes (Signed)
Subjective: Patient resting in bed, mild discomfort on the right side, but overall headache is improved as compared to prior surgery. Has been ambulating.  Objective: Vital signs in last 24 hours: Filed Vitals:   01/22/13 0145 01/22/13 0551 01/22/13 0949 01/22/13 1400  BP: 132/77 120/82 128/83 132/81  Pulse: 88 96 99 105  Temp: 98.1 F (36.7 C) 98.8 F (37.1 C) 98.3 F (36.8 C) 98.7 F (37.1 C)  TempSrc: Oral Oral Oral Oral  Resp: 18 18 18 18   Height:      Weight:      SpO2: 100% 99% 96% 96%    Intake/Output from previous day: 11/05 0701 - 11/06 0700 In: 870 [P.O.:840; I.V.:30] Out: 3300 [Urine:3300] Intake/Output this shift: Total I/O In: 360 [P.O.:360] Out: -   Physical Exam:  Awake and alert, oriented. Moving all 4 extremities well. Wound healing well.  BMET  Recent Labs  01/20/13 0815  NA 139  K 3.7  CL 106  CO2 23  GLUCOSE 139*  BUN 10  CREATININE 1.06  CALCIUM 8.6    Studies/Results: Ct Head Wo Contrast  01/22/2013   CLINICAL DATA:  Craniotomy for subdural hematoma  EXAM: CT HEAD WITHOUT CONTRAST  TECHNIQUE: Contiguous axial images were obtained from the base of the skull through the vertex without intravenous contrast.  COMPARISON:  01/18/2013  FINDINGS: Interval right-sided craniotomy for subdural drainage. No drain is in place. Mixed density subdural hematoma right frontal parietal region again noted containing some gas bubbles. The fluid collection is slightly smaller measuring approximately 14.4 mm maximal thickness comparing 15.3 mm previously. There remains mass effect and midline shift. 12.8 mm midline shift to the left is slightly improved.  No acute infarct. No new hemorrhage or mass lesion. Ventricle size is not enlarged.  IMPRESSION: Slight improvement in moderately large mixed density subdural hematoma on the right. Mild improvement in midline shift which currently measures 12.8 mm to the left.   Electronically Signed   By: Marlan Palau M.D.   On:  01/22/2013 07:42    Assessment/Plan: CT this morning shows a moderate recurrent subdural hematoma, mass effect and midline shift of slightly decreased. Doing well following surgery clinically. Limited ambulation, encouraged to increase.   Hewitt Shorts, MD 01/22/2013, 3:05 PM

## 2013-01-22 NOTE — Progress Notes (Signed)
Patient stable, ambulated three times during this shift, no new complains at this time.

## 2013-01-23 MED ORDER — MAGNESIUM CITRATE PO SOLN
1.0000 | Freq: Once | ORAL | Status: AC
  Administered 2013-01-23: 1 via ORAL
  Filled 2013-01-23: qty 296

## 2013-01-23 MED ORDER — HYDROCODONE-ACETAMINOPHEN 5-325 MG PO TABS: 2.0000 | ORAL_TABLET | Freq: Four times a day (QID) | ORAL | Status: DC | PRN

## 2013-01-23 NOTE — Progress Notes (Signed)
Patient escorted out with sister and staff member via wheelchair. All discharge instructions given and patient verbalized understanding.

## 2013-01-23 NOTE — Progress Notes (Signed)
patient stable this morning, staples removed (every other staples) a total of 11 and 11 remained. MD instructed patient to come to the office on Monday or Tuesday to get the rest removed. He is ready medically, awaiting on his sister for transportation. Other d/cinstructions given to patient.

## 2013-01-23 NOTE — Discharge Summary (Signed)
Physician Discharge Summary  Patient ID: Justin [name] MRN: [id number] DOB/AGE: 04/26/1965 47 y.o.  Admit date: 01/18/2013 Discharge date: 01/23/2013  Admission Diagnoses:  Chronic subdural hematoma, headache  Discharge Diagnoses:  Chronic subdural hematoma, headache  Discharged Condition: good  Hospital Course: Patient was admitted by Dr. Marikay Alar, because of increasing headache. CT scan of the head showed some increase in size of right hemispheric subdural hematoma. Decision was made to proceed with craniotomy for evacuation of subdural hematoma. Postoperative the patient has done well. He is up and ambulating. The headache is improved, but followup CT scan does show some recurrence of subdural hematoma. With the patient doing well neurologically we will discharge him to home and continue to follow him as an outpatient. His incision is healed nicely, I have asked the staff to remove half of the staples, and he is to return in 3-4 days for removal of the rest of the staples. At the time of that visit we will set him up for followup CT scan couple of weeks later. He is encouraged to ambulate everyday out in the fresh air.  He can shower that were run on the incisional area.  Discharge Exam: Blood pressure 138/91, pulse 107, temperature 98.9 F (37.2 C), temperature source Oral, resp. rate 19, height 5\' 9"  (1.753 m), weight 103.4 kg (227 lb 15.3 oz), SpO2 97.00%.  Disposition: Home     Medication List         fluticasone 50 MCG/ACT nasal spray  Commonly known as:  FLONASE  Place 2 sprays into the nose daily.     HYDROcodone-acetaminophen 5-325 MG per tablet  Commonly known as:  NORCO/VICODIN  Take 1-2 tablets by mouth every 4 (four) hours as needed for pain.     HYDROcodone-acetaminophen 5-325 MG per tablet  Commonly known as:  NORCO/VICODIN  Take 2 tablets by mouth every 6 (six) hours as needed for moderate pain or severe pain.     lithium carbonate 450 MG CR tablet  Commonly  known as:  ESKALITH  Take 450 mg by mouth every morning.     lithium 600 MG capsule  Take 600 mg by mouth at bedtime.     mirtazapine 45 MG tablet  Commonly known as:  REMERON  Take 45 mg by mouth at bedtime.     QUEtiapine 400 MG tablet  Commonly known as:  SEROQUEL  Take 800 mg by mouth at bedtime.     SINUS MEDICINE PO  Take 1 tablet by mouth daily as needed (congestion).     sodium chloride 0.65 % Soln nasal spray  Commonly known as:  OCEAN  Place 1 spray into the nose 2 (two) times daily.         Signed: Hewitt Shorts, MD 01/23/2013, 7:33 AM

## 2013-02-16 ENCOUNTER — Other Ambulatory Visit: Payer: Self-pay | Admitting: Neurosurgery

## 2013-02-16 DIAGNOSIS — S065XAA Traumatic subdural hemorrhage with loss of consciousness status unknown, initial encounter: Secondary | ICD-10-CM

## 2013-02-16 DIAGNOSIS — S065X9A Traumatic subdural hemorrhage with loss of consciousness of unspecified duration, initial encounter: Secondary | ICD-10-CM

## 2013-02-20 ENCOUNTER — Ambulatory Visit
Admission: RE | Admit: 2013-02-20 | Discharge: 2013-02-20 | Disposition: A | Discharge status: Home / Self Care | Payer: Medicaid Other | Source: Ambulatory Visit | Attending: Neurosurgery | Admitting: Neurosurgery

## 2013-02-20 DIAGNOSIS — S065XAA Traumatic subdural hemorrhage with loss of consciousness status unknown, initial encounter: Secondary | ICD-10-CM

## 2013-02-20 DIAGNOSIS — S065X9A Traumatic subdural hemorrhage with loss of consciousness of unspecified duration, initial encounter: Secondary | ICD-10-CM

## 2013-03-16 ENCOUNTER — Other Ambulatory Visit: Payer: Self-pay | Admitting: Neurosurgery

## 2013-03-16 DIAGNOSIS — I6200 Nontraumatic subdural hemorrhage, unspecified: Secondary | ICD-10-CM

## 2013-04-03 ENCOUNTER — Ambulatory Visit
Admission: RE | Admit: 2013-04-03 | Discharge: 2013-04-03 | Disposition: A | Discharge status: Home / Self Care | Payer: Medicaid Other | Source: Ambulatory Visit | Attending: Neurosurgery | Admitting: Neurosurgery

## 2013-04-03 DIAGNOSIS — I6200 Nontraumatic subdural hemorrhage, unspecified: Secondary | ICD-10-CM

## 2013-04-23 ENCOUNTER — Encounter: Payer: Self-pay | Admitting: Pulmonary Disease

## 2013-04-23 ENCOUNTER — Ambulatory Visit (INDEPENDENT_AMBULATORY_CARE_PROVIDER_SITE_OTHER): Payer: Medicaid Other | Admitting: Pulmonary Disease

## 2013-04-23 VITALS — BP 140/92 | HR 98 | Temp 98.1°F | Ht 69.0 in | Wt 253.2 lb

## 2013-04-23 DIAGNOSIS — G4733 Obstructive sleep apnea (adult) (pediatric): Secondary | ICD-10-CM | POA: Insufficient documentation

## 2013-04-23 NOTE — Patient Instructions (Signed)
Will schedule you for a sleep study, and try and get your pressure optimized on cpap at the same time. Work on [employer] loss Will order your cpap machine as soon as we get your sleep study results.

## 2013-04-23 NOTE — Assessment & Plan Note (Signed)
The patient has been diagnosed with obstructive sleep apnea in 2010 while living in [place], and was started on CPAP at that time and did well. He has subsequently lost his CPAP machine, and now is having increased sleep disruption and daytime sleepiness. We are unable to locate his old sleep study, and will need to repeat in order to get him approved for a CPAP device. Will schedule for a split night study, and then arrange for him to get a new CPAP once the results are available. I've encouraged him to work aggressively on weight loss.

## 2013-04-23 NOTE — Progress Notes (Signed)
Subjective:    Patient ID: Justin [name], male    DOB: 1966-02-26, 47 y.o.   MRN: [id number]  HPI The patient is a 48 year old male who I've been asked to see for management of obstructive sleep apnea. He was initially diagnosed with sleep apnea in 2010 while living in [place], and was started on a CPAP device. He did very well with this, however his device was lost when he moved from [facility] to [place]. Since that time, he has had progressive snoring and witnessed apneas, and also frequent awakenings at night. He does not feel rested in the mornings upon arising, and has definite inappropriate daytime sleepiness. He does not have any issues in the evening watching television, nor does he have sleepiness with driving. The patient states that his weight is up 15 pounds from his sleep study, and his Epworth score today is abnormal at 12.   Sleep Questionnaire What time do you typically go to bed?( Between what hours) 10pm 10pm at 1045 on 04/23/13 by Maisie Fus, CMA How long does it take you to fall asleep? 65min-1hr 5min-1hr at 1045 on 04/23/13 by Maisie Fus, CMA How many times during the night do you wake up? 2 2 at 1045 on 04/23/13 by Maisie Fus, CMA What time do you get out of bed to start your day? 0800 0800 at 1045 on 04/23/13 by Maisie Fus, CMA Do you drive or operate heavy machinery in your occupation? No No at 1045 on 04/23/13 by Maisie Fus, CMA How much has your weight changed (up or down) over the past two years? (In pounds) 6 lb (2.722 kg) 6 lb (2.722 kg) at 1045 on 04/23/13 by Maisie Fus, CMA Have you ever had a sleep study before? Yes Yes at 1045 on 04/23/13 by Maisie Fus, CMA If yes, location of study? [place], [place], [place] at 1045 on 04/23/13 by Maisie Fus, CMA If yes, date of study? 2010? 2010? at 1045 on 04/23/13 by Maisie Fus, CMA Do you currently use CPAP? No No at 1045 on 04/23/13 by Maisie Fus, CMA  Do you wear oxygen at any time? No No at 1045 on 04/23/13 by Maisie Fus, CMA   Review of Systems  Constitutional: Negative for fever and unexpected weight change.  HENT: Negative for congestion, dental problem, ear pain, nosebleeds, postnasal drip, rhinorrhea, sinus pressure, sneezing, sore throat and trouble swallowing.   Eyes: Negative for redness and itching.  Respiratory: Positive for shortness of breath. Negative for cough, chest tightness and wheezing.   Cardiovascular: Negative for palpitations and leg swelling.  Gastrointestinal: Negative for nausea and vomiting.  Genitourinary: Negative for dysuria.  Musculoskeletal: Negative for joint swelling.  Skin: Negative for rash.  Neurological: Negative for headaches.  Hematological: Does not bruise/bleed easily.  Psychiatric/Behavioral: Positive for dysphoric mood. The patient is not nervous/anxious.        Objective:   Physical Exam Constitutional:  Obese male, no acute distress  HENT:  Nares patent without discharge, but large turbinates with edema  Oropharynx without exudate, palate and uvula are thick and elongated, large tongue.   Eyes:  Perrla, eomi, no scleral icterus  Neck:  No JVD, no TMG  Cardiovascular:  Normal rate, regular rhythm, no rubs or gallops. 2/6 sem        Intact distal pulses  Pulmonary :  Normal breath sounds, no stridor or respiratory distress   No rales, rhonchi, or wheezing  Abdominal:  Soft, nondistended, bowel sounds present.  No tenderness noted.   Musculoskeletal:  No lower extremity edema noted.  Lymph Nodes:  No cervical lymphadenopathy noted  Skin:  No cyanosis noted  Neurologic:  Appears sleepy, but appropriate, moves all 4 extremities without obvious deficit.         Assessment & Plan:

## 2013-05-08 ENCOUNTER — Encounter: Payer: Self-pay | Admitting: Pulmonary Disease

## 2013-05-20 ENCOUNTER — Ambulatory Visit (HOSPITAL_BASED_OUTPATIENT_CLINIC_OR_DEPARTMENT_OTHER): Payer: Medicaid Other | Attending: Pulmonary Disease | Admitting: Radiology

## 2013-05-20 VITALS — Ht 69.0 in | Wt 240.0 lb

## 2013-05-20 DIAGNOSIS — G4733 Obstructive sleep apnea (adult) (pediatric): Secondary | ICD-10-CM

## 2013-05-22 ENCOUNTER — Other Ambulatory Visit: Payer: Self-pay | Admitting: Neurosurgery

## 2013-05-22 DIAGNOSIS — I6200 Nontraumatic subdural hemorrhage, unspecified: Secondary | ICD-10-CM

## 2013-05-25 DIAGNOSIS — G4733 Obstructive sleep apnea (adult) (pediatric): Secondary | ICD-10-CM

## 2013-05-25 NOTE — Sleep Study (Signed)
   NAME: Justin [name] DATE OF BIRTH:  1966/01/21 MEDICAL RECORD NUMBER [id number]  LOCATION: [facility]  PHYSICIAN: Barbaraann Share  DATE OF STUDY: 05/20/2013  SLEEP STUDY TYPE: Nocturnal Polysomnogram               REFERRING PHYSICIAN: Clance, Maree Krabbe, MD  INDICATION FOR STUDY: Hypersomnia with sleep apnea  EPWORTH SLEEPINESS SCORE:  3 HEIGHT: 5\' 9"  (175.3 cm)  WEIGHT: 240 lb (108.863 kg)    Body mass index is 35.43 kg/(m^2).  NECK SIZE: 17 in.  MEDICATIONS: Reviewed in sleep chart  SLEEP ARCHITECTURE: The patient had a total sleep time of 378 minutes with no slow-wave sleep and adequate quantity of REM. Sleep onset latency was mildly prolonged at 45 minutes, and REM onset was normal at 49 minutes. Sleep efficiency was 73% during the diagnostic portion of the study, and 98% during the titration portion.  RESPIRATORY DATA: The patient underwent a split night study where he was found to have 46 obstructive events in the first 121 minutes of sleep. This gave him an AHI of 23 events per hour during the diagnostic portion of the study. The events occurred primarily in the supine position, and there was moderate snoring noted throughout. By protocol, he was fitted with a medium ResMed air fit F10 full face mask, and CPAP titration was initiated. He was found to have an optimal pressure at 13 cm of water.  OXYGEN DATA: There was transient oxygen desaturation as low as 74% with the patient's obstructive events, and this resolved with optimal CPAP pressure.  CARDIAC DATA: No clinically significant arrhythmias were seen  MOVEMENT/PARASOMNIA: No lower movements or other abnormal behaviors were noted.  IMPRESSION/ RECOMMENDATION:    1) split-night study reveals moderate obstructive sleep apnea, with an AHI of 23 events per hour and oxygen desaturation as low as 74% during the diagnostic portion of the study. The patient was then fitted with a medium ResMed air fit F10 full  face mask, and found to have an optimal pressure of 13 cm of water. He should also be encouraged to work aggressively on weight loss.    Barbaraann Share Diplomate, American Board of Sleep Medicine  ELECTRONICALLY SIGNED ON:  05/25/2013, 9:51 AM [facility] PH: [phone]   FX: [phone] ACCREDITED BY THE AMERICAN ACADEMY OF SLEEP MEDICINE

## 2013-05-27 ENCOUNTER — Other Ambulatory Visit: Payer: Self-pay | Admitting: Pulmonary Disease

## 2013-05-27 ENCOUNTER — Telehealth: Payer: Self-pay | Admitting: Pulmonary Disease

## 2013-05-27 DIAGNOSIS — G4733 Obstructive sleep apnea (adult) (pediatric): Secondary | ICD-10-CM

## 2013-05-27 NOTE — Telephone Encounter (Signed)
I spoke with patient about results and he verbalized understanding and had no questions 

## 2013-05-27 NOTE — Telephone Encounter (Signed)
Pt requesting sleep results from 05/20/13. Please advise KC thanks

## 2013-05-27 NOTE — Telephone Encounter (Signed)
Let pt know that his sleep test verified he still has sleep apnea, and cut off breathing 23 times an hour.  His optimal cpap pressure was 13cm. I will send an order to [facility] to get him new cpap machine, and I need to see him back in 8 weeks to see how things are going.  Pt to call me if having tolerance issues.

## 2013-05-29 ENCOUNTER — Ambulatory Visit
Admission: RE | Admit: 2013-05-29 | Discharge: 2013-05-29 | Disposition: A | Discharge status: Home / Self Care | Payer: Medicaid Other | Source: Ambulatory Visit | Attending: Neurosurgery | Admitting: Neurosurgery

## 2013-05-29 DIAGNOSIS — I6200 Nontraumatic subdural hemorrhage, unspecified: Secondary | ICD-10-CM

## 2013-06-03 ENCOUNTER — Telehealth: Payer: Self-pay | Admitting: Pulmonary Disease

## 2013-06-03 NOTE — Telephone Encounter (Signed)
Order for CPAP was faxed 05/27/13. Called [facility] at [phone] to check on order. Spoke with Marchelle Folks. Working on PA through insurance and getting this approved. Once approved they will be contacting pt.  Called and made pt aware. Nothing further needed

## 2013-07-28 ENCOUNTER — Ambulatory Visit: Payer: Medicaid Other | Admitting: Pulmonary Disease

## 2013-08-07 ENCOUNTER — Ambulatory Visit: Payer: Medicaid Other | Admitting: Pulmonary Disease

## 2013-08-13 ENCOUNTER — Emergency Department (HOSPITAL_COMMUNITY)
Admission: EM | Admit: 2013-08-13 | Discharge: 2013-08-13 | Disposition: A | Discharge status: Home / Self Care | Payer: Medicaid Other | Attending: Emergency Medicine | Admitting: Emergency Medicine

## 2013-08-13 ENCOUNTER — Encounter (HOSPITAL_COMMUNITY): Payer: Self-pay | Admitting: Emergency Medicine

## 2013-08-13 DIAGNOSIS — IMO0002 Reserved for concepts with insufficient information to code with codable children: Secondary | ICD-10-CM | POA: Insufficient documentation

## 2013-08-13 DIAGNOSIS — G4730 Sleep apnea, unspecified: Secondary | ICD-10-CM | POA: Insufficient documentation

## 2013-08-13 DIAGNOSIS — Z79899 Other long term (current) drug therapy: Secondary | ICD-10-CM | POA: Insufficient documentation

## 2013-08-13 DIAGNOSIS — H109 Unspecified conjunctivitis: Secondary | ICD-10-CM | POA: Insufficient documentation

## 2013-08-13 DIAGNOSIS — Z86711 Personal history of pulmonary embolism: Secondary | ICD-10-CM | POA: Insufficient documentation

## 2013-08-13 DIAGNOSIS — F319 Bipolar disorder, unspecified: Secondary | ICD-10-CM | POA: Insufficient documentation

## 2013-08-13 MED ORDER — FLUORESCEIN SODIUM 1 MG OP STRP
1.0000 | ORAL_STRIP | Freq: Once | OPHTHALMIC | Status: AC
  Administered 2013-08-13: 1 via OPHTHALMIC
  Filled 2013-08-13: qty 1

## 2013-08-13 MED ORDER — TETRACAINE HCL 0.5 % OP SOLN
2.0000 [drp] | Freq: Once | OPHTHALMIC | Status: AC
  Administered 2013-08-13: 2 [drp] via OPHTHALMIC
  Filled 2013-08-13: qty 2

## 2013-08-13 MED ORDER — ERYTHROMYCIN 5 MG/GM OP OINT: TOPICAL_OINTMENT | OPHTHALMIC | Status: DC

## 2013-08-13 NOTE — ED Provider Notes (Signed)
CSN: [id number]     Arrival date & time 08/13/13  1101 History  This chart was scribed for non-physician practitioner Raymon Mutton, PA-C working with Nelia Shi, MD by Joaquin Music, ED Scribe. This patient was seen in room TR04C/TR04C and the patient's care was started at 12:04 PM .   Chief Complaint  Patient presents with  . Eye Problem   The history is provided by the patient. No language interpreter was used.   HPI Comments: Justin [name] is a 48 y.o. male who presents to the Emergency Department complaining of R eye irritation and drainage with associated R eye tearing that began this morning. Reports having pain with movement; describes the pain as sharp. Stated that when he woke up this morning he had to separate his eye secondary to the discharge and debris on his eye lashes. States "he is not able to see very far normally"; denies vision changes with current sx. Denies itching to R eye, blurred vision, sudden loss of vision, fever, chills, facial numbness and tingling, nasal congestion, sore throat, difficulty breathing, SOB, allergies, foreign body sensation, injury to R eye, and recent sick contacts . Denies glasses and contacts use.   PCP is Dr. Clide Deutscher. Optometrist:no. Recently moved to [place].  Past Medical History  Diagnosis Date  . Pulmonary embolism   . Bipolar 1 disorder   . Subdural hematoma   . Sleep apnea    Past Surgical History  Procedure Laterality Date  . Appendectomy    . Cystectomy      right head  . Craniotomy N/A 01/19/2013    Procedure: CRANIOTOMY HEMATOMA EVACUATION SUBDURAL;  Surgeon: Hewitt Shorts, MD;  Location: [facility];  Service: Neurosurgery;  Laterality: N/A;   Family History  Problem Relation Age of Onset  . Hypertension Mother    History  Substance Use Topics  . Smoking status: Never Smoker   . Smokeless tobacco: Not on file  . Alcohol Use: No    Review of Systems  Constitutional: Negative for fever and  chills.  HENT: Negative for congestion, sore throat and trouble swallowing.   Eyes: Positive for pain, discharge and redness. Negative for photophobia, itching and visual disturbance.  Respiratory: Negative for apnea and shortness of breath.   Skin: Negative for rash and wound.  Allergic/Immunologic: Negative for environmental allergies and food allergies.  Neurological: Negative for numbness.   Allergies  Review of patient's allergies indicates no known allergies.  Home Medications   Prior to Admission medications   Medication Sig Start Date End Date Taking? Authorizing Provider  fluticasone (FLONASE) 50 MCG/ACT nasal spray Place 2 sprays into the nose daily. 01/08/13   Bernadene Person, NP  Homeopathic Products (SINUS MEDICINE PO) Take 1 tablet by mouth daily as needed (congestion).    Historical Provider, MD  lithium 600 MG capsule Take 600 mg by mouth at bedtime.    Historical Provider, MD  lithium carbonate (ESKALITH) 450 MG CR tablet Take 450 mg by mouth every morning.    Historical Provider, MD  mirtazapine (REMERON) 45 MG tablet Take 45 mg by mouth at bedtime.    Historical Provider, MD  QUEtiapine (SEROQUEL) 400 MG tablet Take 800 mg by mouth at bedtime.    Historical Provider, MD  sodium chloride (OCEAN) 0.65 % SOLN nasal spray Place 1 spray into the nose 2 (two) times daily. 01/08/13   Bernadene Person, NP   BP 131/89  Pulse 90  Resp 16  Ht 5'  9" (1.753 m)  Wt 250 lb (113.399 kg)  BMI 36.90 kg/m2  SpO2 98%  Physical Exam  Nursing note and vitals reviewed. Constitutional: He is oriented to person, place, and time. He appears well-developed and well-nourished. No distress.  When this provider walks into the room patient sitting currently upright in chair with headphones on  HENT:  Head: Normocephalic and atraumatic.  Mouth/Throat: Oropharynx is clear and moist. No oropharyngeal exudate.  Eyes: EOM are normal. Pupils are equal, round, and reactive to light. Lids  are everted and swept, no foreign bodies found. Right eye exhibits no chemosis, no discharge, no exudate and no hordeolum. No foreign body present in the right eye. Left eye exhibits no chemosis, no discharge, no exudate and no hordeolum. No foreign body present in the left eye. Right conjunctiva is injected. Right conjunctiva has no hemorrhage. Left conjunctiva is not injected. Left conjunctiva has no hemorrhage. Right eye exhibits normal extraocular motion and no nystagmus. Left eye exhibits normal extraocular motion and no nystagmus. Right pupil is round and reactive. Left pupil is round and reactive.  Fundoscopic exam:      The right eye shows no arteriolar narrowing, no AV nicking, no exudate, no hemorrhage and no papilledema.       The left eye shows no arteriolar narrowing, no AV nicking, no exudate, no hemorrhage and no papilledema.  Slit lamp exam:      The right eye shows no corneal abrasion, no corneal flare, no corneal ulcer, no foreign body, no hyphema, no fluorescein uptake and no anterior chamber bulge.       The left eye shows no corneal abrasion, no corneal flare, no corneal ulcer, no foreign body and no hyphema.  Neck: Normal range of motion. Neck supple. No tracheal deviation present.  Negative neck stiffness Negative nuchal rigidity Next cervical lymphadenopathy Negative meningeal signs  Cardiovascular: Normal rate, regular rhythm and normal heart sounds.  Exam reveals no friction rub.   No murmur heard. Pulses:      Radial pulses are 2+ on the right side, and 2+ on the left side.  Pulmonary/Chest: Effort normal and breath sounds normal. No respiratory distress. He has no wheezes. He has no rales.  Musculoskeletal: Normal range of motion.  Lymphadenopathy:    He has no cervical adenopathy.  Neurological: He is alert and oriented to person, place, and time. No cranial nerve deficit. He exhibits normal muscle tone. Coordination normal.  Cranial nerves III-XII grossly  intact Negative facial drooping Negative slurred speech Negative aphasia  Skin: Skin is warm and dry. No rash noted. He is not diaphoretic. No erythema.  Psychiatric: He has a normal mood and affect. His behavior is normal. Thought content normal.    ED Course  Procedures  DIAGNOSTIC STUDIES: Oxygen Saturation is 98% on RA, normal by my interpretation.    COORDINATION OF CARE: 12:09 PM-Discussed treatment plan which includes slit lamp exam. Pt agreed to plan.   Labs Review Labs Reviewed - No data to display  Imaging Review No results found.   EKG Interpretation None     MDM   Final diagnoses:  Conjunctivitis    Filed Vitals:   08/13/13 1118  BP: 131/89  Pulse: 90  Resp: 16  Height: 5\' 9"  (1.753 m)  Weight: 250 lb (113.399 kg)  SpO2: 98%   I personally performed the services described in this documentation, which was scribed in my presence. The recorded information has been reviewed and is accurate.  Conjunctival injection  identified to the right eye with medial canthus discharge noted of a thick white discolored. Negative active tearing or drainage noted. Mild discomfort upon palpation. Visual fields grossly intact. Visual acuity unremarkable. Negative Seidel sign. Negative corneal abrasion corneal ulcer noted. Doubt acute angle glaucoma. Doubt herpetic keratosis. Patient stable, afebrile. Discharged patient. Suspicion to be conjunctivitis-viral versus bacterial. Possiblely iritis (?). Patient stable, afebrile. Discharged patient. Discharged patient with antibiotic ointment. Discussed with patient to rest and apply cool compressions. Referred patient to primary care provider and ophthalmologist. Discussed with patient to closely monitor symptoms and if symptoms are to worsen or change to report back to the ED - strict return instructions given.  Patient agreed to plan of care, understood, all questions answered.   Raymon MuttonMarissa Laveyah Oriol, PA-C 08/13/13 1744

## 2013-08-13 NOTE — Discharge Instructions (Signed)
Please call your doctor for a followup appointment within 24-48 hours. When you talk to your doctor please let them know that you were seen in the emergency department and have them acquire all of your records so that they can discuss the findings with you and formulate a treatment plan to fully care for your new and ongoing problems. Please call and set-up an appointment with Eye doctor to be seen and re-assessed Please use medications as prescribed Please wash all towels, pillow cases for this can be contagious Please avoid scratching the right eye and on the left side-please wash hands Please continue to monitor symptoms closely and if symptoms are to worsen or change (fever greater than 101, chills, chest pain, shortness of breath, difficulty breathing, numbness, tingling, swelling to the eye, drainage the eye, loss of vision, floaters, changes to fields of vision, worsening or changes to pain, eye injury) please report back to the ED immediately   Conjunctivitis Conjunctivitis is commonly called "pink eye." Conjunctivitis can be caused by bacterial or viral infection, allergies, or injuries. There is usually redness of the lining of the eye, itching, discomfort, and sometimes discharge. There may be deposits of matter along the eyelids. A viral infection usually causes a watery discharge, while a bacterial infection causes a yellowish, thick discharge. Pink eye is very contagious and spreads by direct contact. You may be given antibiotic eyedrops as part of your treatment. Before using your eye medicine, remove all drainage from the eye by washing gently with warm water and cotton balls. Continue to use the medication until you have awakened 2 mornings in a row without discharge from the eye. Do not rub your eye. This increases the irritation and helps spread infection. Use separate towels from other household members. Wash your hands with soap and water before and after touching your eyes. Use cold  compresses to reduce pain and sunglasses to relieve irritation from light. Do not wear contact lenses or wear eye makeup until the infection is gone. SEEK MEDICAL CARE IF:   Your symptoms are not better after 3 days of treatment.  You have increased pain or trouble seeing.  The outer eyelids become very red or swollen. Document Released: 04/12/2004 Document Revised: 05/28/2011 Document Reviewed: 03/05/2005 [facility] Patient Information 2014 [place], [place].

## 2013-08-13 NOTE — ED Notes (Signed)
Onset this am, right eye irritation and drainage. No known injury.

## 2013-08-14 NOTE — ED Provider Notes (Signed)
Medical screening examination/treatment/procedure(s) were performed by non-physician practitioner and as supervising physician I was immediately available for consultation/collaboration.   Nelia Shi, MD 08/14/13 [phone]

## 2013-08-24 ENCOUNTER — Ambulatory Visit (INDEPENDENT_AMBULATORY_CARE_PROVIDER_SITE_OTHER): Payer: Medicaid Other | Admitting: Pulmonary Disease

## 2013-08-24 ENCOUNTER — Encounter: Payer: Self-pay | Admitting: Pulmonary Disease

## 2013-08-24 VITALS — BP 116/82 | HR 89 | Temp 98.2°F | Ht 69.0 in | Wt 253.2 lb

## 2013-08-24 DIAGNOSIS — G4733 Obstructive sleep apnea (adult) (pediatric): Secondary | ICD-10-CM

## 2013-08-24 NOTE — Assessment & Plan Note (Signed)
The patient has been doing very well with his CPAP compliance, and has very little he on his download. He is having some breakthrough apnea, and we'll therefore set his device on the automatic setting. I've also encouraged him to work aggressively on weight loss.

## 2013-08-24 NOTE — Patient Instructions (Signed)
Continue on cpap, and will have your device set on the auto setting. Will send an order to [facility] asking them to get you new mask seals. Work on weight loss followup with me again in one year.

## 2013-08-24 NOTE — Progress Notes (Signed)
   Subjective:    Patient ID: Justin [name], male    DOB: Oct 05, 1965, 48 y.o.   MRN: [id number]  HPI Patient comes in today for followup of his known obstructive sleep apnea. He is wearing CPAP compliantly by his download, has no significant mask leaks, but is having some breakthrough apnea at times. He feels that he sleeps well with his device, and is satisfied with his daytime alertness. However, he is having some mask leaks at the bridge of his nose. He has not changed the cushion since he started CPAP.   Review of Systems  Constitutional: Negative for fever and unexpected weight change.  HENT: Negative for congestion, dental problem, ear pain, nosebleeds, postnasal drip, rhinorrhea, sinus pressure, sneezing, sore throat and trouble swallowing.   Eyes: Negative for redness and itching.  Respiratory: Negative for cough, chest tightness, shortness of breath and wheezing.   Cardiovascular: Negative for palpitations and leg swelling.  Gastrointestinal: Negative for nausea and vomiting.  Genitourinary: Negative for dysuria.  Musculoskeletal: Negative for joint swelling.  Skin: Negative for rash.  Neurological: Negative for headaches.  Hematological: Does not bruise/bleed easily.  Psychiatric/Behavioral: Negative for dysphoric mood. The patient is not nervous/anxious.        Objective:   Physical Exam Overweight male in no acute distress Nose without purulence or discharge noted No skin breakdown or pressure necrosis from the CPAP mask Neck without lymphadenopathy or thyromegaly Lower extremities without edema, no cyanosis Alert and oriented, does not appear to be sleepy, moves all 4 extremities.       Assessment & Plan:

## 2014-01-15 ENCOUNTER — Encounter (HOSPITAL_COMMUNITY): Payer: Self-pay | Admitting: Emergency Medicine

## 2014-01-15 ENCOUNTER — Emergency Department (HOSPITAL_COMMUNITY)
Admission: EM | Admit: 2014-01-15 | Discharge: 2014-01-15 | Disposition: A | Discharge status: Home / Self Care | Payer: Medicaid Other | Attending: Emergency Medicine | Admitting: Emergency Medicine

## 2014-01-15 DIAGNOSIS — L03213 Periorbital cellulitis: Secondary | ICD-10-CM

## 2014-01-15 DIAGNOSIS — F319 Bipolar disorder, unspecified: Secondary | ICD-10-CM | POA: Diagnosis not present

## 2014-01-15 DIAGNOSIS — H05011 Cellulitis of right orbit: Secondary | ICD-10-CM | POA: Insufficient documentation

## 2014-01-15 DIAGNOSIS — H578 Other specified disorders of eye and adnexa: Secondary | ICD-10-CM | POA: Diagnosis present

## 2014-01-15 DIAGNOSIS — Z86711 Personal history of pulmonary embolism: Secondary | ICD-10-CM | POA: Insufficient documentation

## 2014-01-15 DIAGNOSIS — H00013 Hordeolum externum right eye, unspecified eyelid: Secondary | ICD-10-CM | POA: Insufficient documentation

## 2014-01-15 DIAGNOSIS — Z79899 Other long term (current) drug therapy: Secondary | ICD-10-CM | POA: Diagnosis not present

## 2014-01-15 MED ORDER — HYDROCODONE-ACETAMINOPHEN 5-325 MG PO TABS: 1.0000 | ORAL_TABLET | ORAL | Status: DC | PRN

## 2014-01-15 MED ORDER — SULFAMETHOXAZOLE-TMP DS 800-160 MG PO TABS
1.0000 | ORAL_TABLET | Freq: Once | ORAL | Status: AC
  Administered 2014-01-15: 1 via ORAL
  Filled 2014-01-15: qty 1

## 2014-01-15 MED ORDER — HYDROCODONE-ACETAMINOPHEN 5-325 MG PO TABS
1.0000 | ORAL_TABLET | Freq: Once | ORAL | Status: AC
  Administered 2014-01-15: 1 via ORAL
  Filled 2014-01-15: qty 1

## 2014-01-15 MED ORDER — SULFAMETHOXAZOLE-TRIMETHOPRIM 800-160 MG PO TABS: 1.0000 | ORAL_TABLET | Freq: Two times a day (BID) | ORAL | Status: AC

## 2014-01-15 MED ORDER — POLYMYXIN B-TRIMETHOPRIM 10000-0.1 UNIT/ML-% OP SOLN: 1.0000 [drp] | OPHTHALMIC | Status: DC

## 2014-01-15 NOTE — ED Provider Notes (Signed)
CSN: [id number]     Arrival date & time 01/15/14  1753 History  This chart was scribed for [facility] LLCEmily Reyes Fifield, GeorgiaPA, working with Raelyn NumberKristen N Ward, DO found by Elon SpannerGarrett Cook, ED Scribe. This patient was seen in room TR04C/TR04C and the patient's care was started at 6:35 PM.   Chief Complaint  Patient presents with  . Eye Problem   The history is provided by the patient. No language interpreter was used.   HPI Comments: Justin MomentRobert C [name] is a 48 y.o. male who presents to the Emergency Department complaining of improving right eye redness and white drainage with associated periocular throbbing eye pain and swelling onset 3 days ago.  Has "lump" in his lower lid that began around this time as well.  He reports that squinting aggravates the pain but denies movement of the eye aggravates the pain.  Patient has been using warm compresses and has noticed some improvement in terms of decreased swelling.  Patient denies using corrective lenses.  Patient denies injury or foreign body exposure to eye.  Patient denies vision changes, sore throat, cough, sinus pain, congestion.  NKA.   Past Medical History  Diagnosis Date  . Pulmonary embolism   . Bipolar 1 disorder   . Subdural hematoma   . Sleep apnea    Past Surgical History  Procedure Laterality Date  . Appendectomy    . Cystectomy      right head  . Craniotomy N/A 01/19/2013    Procedure: CRANIOTOMY HEMATOMA EVACUATION SUBDURAL;  Surgeon: Hewitt Shortsobert W Nudelman, MD;  Location: [facility];  Service: Neurosurgery;  Laterality: N/A;   Family History  Problem Relation Age of Onset  . Hypertension Mother    History  Substance Use Topics  . Smoking status: Never Smoker   . Smokeless tobacco: Not on file  . Alcohol Use: No    Review of Systems  Constitutional: Negative for fever.  HENT: Negative for congestion, sinus pressure and sore throat.   Eyes: Positive for discharge and redness. Negative for photophobia, pain, itching and visual disturbance.   Respiratory: Negative for cough.   Skin: Negative for wound.  Allergic/Immunologic: Negative for immunocompromised state.      Allergies  Review of patient's allergies indicates no known allergies.  Home Medications   Prior to Admission medications   Medication Sig Start Date End Date Taking? Authorizing Provider  erythromycin ophthalmic ointment Place a 1/2 inch ribbon of ointment into the lower eyelid of the right eye BID for 5 days. 08/13/13   Marissa Sciacca, PA-C  lithium 600 MG capsule Take 600 mg by mouth at bedtime.    Historical Provider, MD  lithium carbonate (ESKALITH) 450 MG CR tablet Take 450 mg by mouth every morning.    Historical Provider, MD  mirtazapine (REMERON) 45 MG tablet Take 45 mg by mouth at bedtime.    Historical Provider, MD  QUEtiapine (SEROQUEL) 400 MG tablet Take 800 mg by mouth at bedtime.    Historical Provider, MD   BP 129/88  Pulse 94  Temp(Src) 99 F (37.2 C) (Oral)  Resp 18  SpO2 95% Physical Exam  Nursing note and vitals reviewed. Constitutional: He appears well-developed and well-nourished. No distress.  HENT:  Head: Normocephalic and atraumatic.  Eyes: EOM are normal. Pupils are equal, round, and reactive to light. Right eye exhibits discharge and hordeolum. Right conjunctiva is injected. Right conjunctiva has no hemorrhage. No scleral icterus.  Right eyelid and lateral periorbital space with edema, tenderness.  No erythema.,  EOMs intact without pain.    Neck: Neck supple.  Pulmonary/Chest: Effort normal.  Neurological: He is alert.  Skin: He is not diaphoretic.    ED Course  Procedures (including critical care time)  DIAGNOSTIC STUDIES: Oxygen Saturation is 95% on RA, normal by my interpretation.    COORDINATION OF CARE:  6:45 PM Will order oral and eye drop antibiotics.  Patient acknowledges and agrees with plan.    Labs Review Labs Reviewed - No data to display  Imaging Review No results found.   EKG  Interpretation None      MDM   Final diagnoses:  Hordeolum externum (stye), right  Preseptal cellulitis of right eye    Afebrile, nontoxic patient with right eye redness, swelling, periorbital pain.  No vision changes, no pain with EOMs.  Doubt orbital cellulitis.  Doubt glaucoma. Pt does have hordeolum in lower lid.  Will treat for preseptal cellulitis.   D/C home with close opthalmology follow up.  Given bactrim, polytrim, norco.  First dose of bactrim and pain medication here.  Discussed result, findings, treatment, and follow up  with patient.  Pt given return precautions.  Pt verbalizes understanding and agrees with plan.       I personally performed the services described in this documentation, which was scribed in my presence. The recorded information has been reviewed and is accurate.    Trixie Dredge, PA-C 01/15/14 [phone]

## 2014-01-15 NOTE — ED Provider Notes (Signed)
Medical screening examination/treatment/procedure(s) were performed by non-physician practitioner and as supervising physician I was immediately available for consultation/collaboration.   EKG Interpretation None        Layla Maw Lysle Yero, DO 01/15/14 2321

## 2014-01-15 NOTE — Discharge Instructions (Signed)
Read the information below.  Use the prescribed medication as directed.  Please discuss all new medications with your pharmacist.  Do not take additional tylenol while taking the prescribed pain medication to avoid overdose.  You may return to the Emergency Department at any time for worsening condition or any new symptoms that concern you.   If you develop worsening pain in your eye, change in your vision, swelling around your eye, difficulty moving your eye, or fevers greater than 100.4, see your eye doctor or return to the Emergency Department immediately for a recheck.      Periorbital Cellulitis Periorbital cellulitis is a common infection that can affect the eyelid and the soft tissues that surround the eyeball. The infection may also affect the structures that produce and drain tears. It does not affect the eyeball itself. Natural tissue barriers usually prevent the spread of this infection to the eyeball and other deeper areas of the eye socket.  CAUSES  Bacterial infection.  Long-term (chronic) sinus infections.  An object (foreign body) stuck behind the eye.  An injury that goes through the eyelid tissues.  An injury that causes an infection, such as an insect sting.  Fracture of the bone around the eye.  Infections which have spread from the eyelid or other structures around the eye.  Bite wounds.  Inflammation or infection of the lining membranes of the brain (meningitis).  An infection in the blood (septicemia).  Dental infection (abscess).  Viral infection (this is rare). SYMPTOMS Symptoms usually come on suddenly.  Pain in the eye.  Red, hot, and swollen eyelids and possibly cheeks. The swelling is sometimes bad enough that the eyelids cannot open. Some infections make the eyelids look purple.  Fever and feeling generally ill.  Pain when touching the area around the eye. DIAGNOSIS  Periorbital cellulitis can be diagnosed from an eye exam. In severe cases,  your caregiver might suggest:  Blood tests.  Imaging tests (such as a CT scan) to examine the sinuses and the area around and behind the eyeball. TREATMENT If your caregiver feels that you do not have any signs of serious infection, treatment may include:  Antibiotics.  Nasal decongestants to reduce swelling.  Referral to a dentist if it is suspected that the infection was caused by a prior tooth infection.  Examination every day to make sure the problem is improving. HOME CARE INSTRUCTIONS  Take your antibiotics as directed. Finish them even if you start to feel better.  Some pain is normal with this condition. Take pain medicine as directed by your caregiver. Only take pain medicines approved by your caregiver.  It is important to drink fluids. Drink enough water and fluids to keep your urine clear or pale yellow.  Do not smoke.  Rest and get plenty of sleep.  Mild or moderate fevers generally have no long-term effects and often do not require treatment.  If your caregiver has given you a follow-up appointment, it is very important to keep that appointment. Your caregiver will need to make sure that the infection is getting better. It is important to check that a more serious infection is not developing. SEEK IMMEDIATE MEDICAL CARE IF:  Your eyelids become more painful, red, warm, or swollen.  You develop double vision or your vision becomes blurred or worsens in any way.  You have trouble moving your eyes.  The eye looks like it is popping out (proptosis).  You develop a severe headache, severe neck pain, or neck stiffness.  You develop repeated vomiting.  You have a fever or persistent symptoms for more than 72 hours.  You have a fever and your symptoms suddenly get worse. MAKE SURE YOU:  Understand these instructions.  Will watch your condition.  Will get help right away if you are not doing well or get worse. Document Released: 04/07/2010 Document Revised:  05/28/2011 Document Reviewed: 04/07/2010 [facility] Patient Information 2015 [place], [place]. This information is not intended to replace advice given to you by your health care provider. Make sure you discuss any questions you have with your health care provider.  Sty A sty (hordeolum) is an infection of a gland in the eyelid located at the base of the eyelash. A sty may develop a white or yellow head of pus. It can be puffy (swollen). Usually, the sty will burst and pus will come out on its own. They do not leave lumps in the eyelid once they drain. A sty is often confused with another form of cyst of the eyelid called a chalazion. Chalazions occur within the eyelid and not on the edge where the bases of the eyelashes are. They often are red, sore and then form firm lumps in the eyelid. CAUSES   Germs (bacteria).  Lasting (chronic) eyelid inflammation. SYMPTOMS   Tenderness, redness and swelling along the edge of the eyelid at the base of the eyelashes.  Sometimes, there is a white or yellow head of pus. It may or may not drain. DIAGNOSIS  An ophthalmologist will be able to distinguish between a sty and a chalazion and treat the condition appropriately.  TREATMENT   Styes are typically treated with warm packs (compresses) until drainage occurs.  In rare cases, medicines that kill germs (antibiotics) may be prescribed. These antibiotics may be in the form of drops, cream or pills.  If a hard lump has formed, it is generally necessary to do a small incision and remove the hardened contents of the cyst in a minor surgical procedure done in the office.  In suspicious cases, your caregiver may send the contents of the cyst to the lab to be certain that it is not a rare, but dangerous form of cancer of the glands of the eyelid. HOME CARE INSTRUCTIONS   Wash your hands often and dry them with a clean towel. Avoid touching your eyelid. This may spread the infection to other parts of the  eye.  Apply heat to your eyelid for 10 to 20 minutes, several times a day, to ease pain and help to heal it faster.  Do not squeeze the sty. Allow it to drain on its own. Wash your eyelid carefully 3 to 4 times per day to remove any pus. SEEK IMMEDIATE MEDICAL CARE IF:   Your eye becomes painful or puffy (swollen).  Your vision changes.  Your sty does not drain by itself within 3 days.  Your sty comes back within a short period of time, even with treatment.  You have redness (inflammation) around the eye.  You have a fever. Document Released: 12/13/2004 Document Revised: 05/28/2011 Document Reviewed: 06/19/2013 [facility] Patient Information 2015 [place], [place]. This information is not intended to replace advice given to you by your health care provider. Make sure you discuss any questions you have with your health care provider.

## 2014-01-15 NOTE — ED Notes (Signed)
Pt reports redness and pain to right eye x 2 days. Denies vision changes or itching.

## 2014-03-02 ENCOUNTER — Encounter: Payer: Self-pay | Admitting: Adult Health

## 2014-03-02 ENCOUNTER — Ambulatory Visit (INDEPENDENT_AMBULATORY_CARE_PROVIDER_SITE_OTHER): Payer: Medicaid Other | Admitting: Adult Health

## 2014-03-02 VITALS — BP 122/82 | HR 92 | Temp 98.2°F | Ht 69.0 in | Wt 258.2 lb

## 2014-03-02 DIAGNOSIS — Z23 Encounter for immunization: Secondary | ICD-10-CM

## 2014-03-02 DIAGNOSIS — G4733 Obstructive sleep apnea (adult) (pediatric): Secondary | ICD-10-CM

## 2014-03-02 NOTE — Patient Instructions (Signed)
Wear CPAP At bedtime   Keep up good work  Continue to work on [employer] loss Follow up Dr. Shelle Iron in 6 months and As needed   Flu shot today

## 2014-03-02 NOTE — Progress Notes (Signed)
   Subjective:    Patient ID: Justin [name], male    DOB: 1965-09-19, 48 y.o.   MRN: [id number]  HPI 48 yo with OSA on CPAP   03/02/2014 Follow up OSA  Returns for follow up of his sleep apnea.  On CPAP At bedtime  .  Says he is doing very well  Feels rested with no significant daytime sleepiness.  Wearing every night x11-12hr Download shows great compliance at 99% with avg usage ~9-10 hr each night.  On auto setting from 5-20 .     Review of Systems Constitutional:   No  weight loss, night sweats,  Fevers, chills,  +fatigue, or  lassitude.  HEENT:   No headaches,  Difficulty swallowing,  Tooth/dental problems, or  Sore throat,                No sneezing, itching, ear ache, nasal congestion, post nasal drip,   CV:  No chest pain,  Orthopnea, PND, swelling in lower extremities, anasarca, dizziness, palpitations, syncope.   GI  No heartburn, indigestion, abdominal pain, nausea, vomiting, diarrhea, change in bowel habits, loss of appetite, bloody stools.   Resp: No shortness of breath with exertion or at rest.  No excess mucus, no productive cough,  No non-productive cough,  No coughing up of blood.  No change in color of mucus.  No wheezing.  No chest wall deformity  Skin: no rash or lesions.  GU: no dysuria, change in color of urine, no urgency or frequency.  No flank pain, no hematuria   MS:  No joint pain or swelling.  No decreased range of motion.  No back pain.  Psych:  No change in mood or affect. No depression or anxiety.  No memory loss.         Objective:   Physical Exam GEN: A/Ox3; pleasant , NAD, obese   HEENT:  [place]/AT,  EACs-clear, TMs-wnl, NOSE-clear, THROAT-clear, no lesions, no postnasal drip or exudate noted.   NECK:  Supple w/ fair ROM; no JVD; normal carotid impulses w/o bruits; no thyromegaly or nodules palpated; no lymphadenopathy.  RESP  Clear  P & A; w/o, wheezes/ rales/ or rhonchi.no accessory muscle use, no dullness to percussion  CARD:  RRR,  no m/r/g  , no peripheral edema, pulses intact, no cyanosis or clubbing.  GI:   Soft & nt; nml bowel sounds; no organomegaly or masses detected.  Musco: Warm bil, no deformities or joint swelling noted.   Neuro: alert, no focal deficits noted.    Skin: Warm, no lesions or rashes         Assessment & Plan:

## 2014-03-04 NOTE — Assessment & Plan Note (Signed)
Doing well on CPAP w/ good compliance   Plan  Wear CPAP At bedtime   Keep up good work  Continue to work on [employer] loss Follow up Dr. Shelle Iron in 6 months and As needed   Flu shot today

## 2014-04-04 ENCOUNTER — Emergency Department (HOSPITAL_COMMUNITY): Payer: Medicaid Other

## 2014-04-04 ENCOUNTER — Emergency Department (HOSPITAL_COMMUNITY)
Admission: EM | Admit: 2014-04-04 | Discharge: 2014-04-04 | Disposition: A | Discharge status: Home / Self Care | Payer: Medicaid Other | Attending: Emergency Medicine | Admitting: Emergency Medicine

## 2014-04-04 ENCOUNTER — Emergency Department (HOSPITAL_COMMUNITY)
Admission: EM | Admit: 2014-04-04 | Discharge: 2014-04-04 | Disposition: A | Discharge status: Home / Self Care | Payer: Medicaid Other | Source: Home / Self Care | Attending: Emergency Medicine | Admitting: Emergency Medicine

## 2014-04-04 ENCOUNTER — Encounter (HOSPITAL_COMMUNITY): Payer: Self-pay | Admitting: *Deleted

## 2014-04-04 ENCOUNTER — Encounter (HOSPITAL_COMMUNITY): Payer: Self-pay

## 2014-04-04 DIAGNOSIS — Z8669 Personal history of other diseases of the nervous system and sense organs: Secondary | ICD-10-CM | POA: Insufficient documentation

## 2014-04-04 DIAGNOSIS — M25521 Pain in right elbow: Secondary | ICD-10-CM | POA: Diagnosis not present

## 2014-04-04 DIAGNOSIS — M79631 Pain in right forearm: Secondary | ICD-10-CM | POA: Insufficient documentation

## 2014-04-04 DIAGNOSIS — R531 Weakness: Secondary | ICD-10-CM

## 2014-04-04 DIAGNOSIS — R200 Anesthesia of skin: Secondary | ICD-10-CM | POA: Insufficient documentation

## 2014-04-04 DIAGNOSIS — Z79899 Other long term (current) drug therapy: Secondary | ICD-10-CM | POA: Insufficient documentation

## 2014-04-04 DIAGNOSIS — R51 Headache: Secondary | ICD-10-CM | POA: Diagnosis not present

## 2014-04-04 DIAGNOSIS — F319 Bipolar disorder, unspecified: Secondary | ICD-10-CM

## 2014-04-04 DIAGNOSIS — Z86711 Personal history of pulmonary embolism: Secondary | ICD-10-CM | POA: Insufficient documentation

## 2014-04-04 DIAGNOSIS — Z8679 Personal history of other diseases of the circulatory system: Secondary | ICD-10-CM | POA: Insufficient documentation

## 2014-04-04 DIAGNOSIS — R519 Headache, unspecified: Secondary | ICD-10-CM

## 2014-04-04 LAB — BASIC METABOLIC PANEL
Anion gap: 10 (ref 5–15)
BUN: 8 mg/dL (ref 6–23)
CHLORIDE: 105 meq/L (ref 96–112)
CO2: 23 mmol/L (ref 19–32)
CREATININE: 1.52 mg/dL — AB (ref 0.50–1.35)
Calcium: 8.7 mg/dL (ref 8.4–10.5)
GFR calc Af Amer: 60 mL/min — ABNORMAL LOW (ref >90)
GFR calc non Af Amer: 52 mL/min — ABNORMAL LOW (ref >90)
Glucose, Bld: 102 mg/dL — ABNORMAL HIGH (ref 70–99)
Potassium: 3.8 mmol/L (ref 3.5–5.1)
Sodium: 138 mmol/L (ref 135–145)

## 2014-04-04 LAB — URINALYSIS, ROUTINE W REFLEX MICROSCOPIC
Bilirubin Urine: NEGATIVE
GLUCOSE, UA: NEGATIVE mg/dL
Hgb urine dipstick: NEGATIVE
KETONES UR: NEGATIVE mg/dL
LEUKOCYTES UA: NEGATIVE
NITRITE: NEGATIVE
PH: 6 (ref 5.0–8.0)
Protein, ur: NEGATIVE mg/dL
Specific Gravity, Urine: 1.01 (ref 1.005–1.030)
UROBILINOGEN UA: 0.2 mg/dL (ref 0.0–1.0)

## 2014-04-04 LAB — CBC
HEMATOCRIT: 37.2 % — AB (ref 39.0–52.0)
Hemoglobin: 12.8 g/dL — ABNORMAL LOW (ref 13.0–17.0)
MCH: 28.6 pg (ref 26.0–34.0)
MCHC: 34.4 g/dL (ref 30.0–36.0)
MCV: 83 fL (ref 78.0–100.0)
Platelets: 236 10*3/uL (ref 150–400)
RBC: 4.48 MIL/uL (ref 4.22–5.81)
RDW: 13.6 % (ref 11.5–15.5)
WBC: 12 10*3/uL — AB (ref 4.0–10.5)

## 2014-04-04 LAB — LITHIUM LEVEL: LITHIUM LVL: 0.27 mmol/L — AB (ref 0.80–1.40)

## 2014-04-04 MED ORDER — SODIUM CHLORIDE 0.9 % IV BOLUS (SEPSIS)
1000.0000 mL | Freq: Once | INTRAVENOUS | Status: AC
  Administered 2014-04-04: 1000 mL via INTRAVENOUS

## 2014-04-04 MED ORDER — DIPHENHYDRAMINE HCL 50 MG/ML IJ SOLN
25.0000 mg | Freq: Once | INTRAMUSCULAR | Status: AC
  Administered 2014-04-04: 25 mg via INTRAVENOUS

## 2014-04-04 MED ORDER — DIPHENHYDRAMINE HCL 50 MG/ML IJ SOLN
25.0000 mg | Freq: Once | INTRAMUSCULAR | Status: DC
  Filled 2014-04-04: qty 1

## 2014-04-04 MED ORDER — KETOROLAC TROMETHAMINE 30 MG/ML IJ SOLN
30.0000 mg | Freq: Once | INTRAMUSCULAR | Status: AC
  Administered 2014-04-04: 30 mg via INTRAVENOUS
  Filled 2014-04-04: qty 1

## 2014-04-04 MED ORDER — METHOCARBAMOL 500 MG PO TABS: 500.0000 mg | ORAL_TABLET | Freq: Two times a day (BID) | ORAL | Status: DC

## 2014-04-04 MED ORDER — OXYCODONE-ACETAMINOPHEN 5-325 MG PO TABS: 1.0000 | ORAL_TABLET | ORAL | Status: DC | PRN

## 2014-04-04 MED ORDER — PROCHLORPERAZINE EDISYLATE 5 MG/ML IJ SOLN
10.0000 mg | Freq: Once | INTRAMUSCULAR | Status: AC
  Administered 2014-04-04: 10 mg via INTRAVENOUS
  Filled 2014-04-04: qty 2

## 2014-04-04 MED ORDER — TRAMADOL HCL 50 MG PO TABS: 50.0000 mg | ORAL_TABLET | Freq: Four times a day (QID) | ORAL | Status: DC | PRN

## 2014-04-04 NOTE — ED Provider Notes (Signed)
CSN: [id number]     Arrival date & time 04/04/14  1017 History  This chart was scribed for [name], PA-C, working with Candyce Churn III, * by Chestine Spore, ED Scribe. The patient was seen in room TR05C/TR05C at 10:43 AM.    Chief Complaint  Patient presents with  . Arm Pain    The history is provided by the patient. No language interpreter was used.    HPI Comments: Justin [name] is a 49 y.o. male with a hx of PE who presents to the Emergency Department complaining of constant right arm pain onset 1.5 week. He notes that he has pain to his right elbow and right forearm. He denies any injury to the affected areas. He reports that there is pain with movement and with his grip strength. He states that he is having associated symptoms of weakness, numbness. He states that he has tried Aleve with no relief for his symptoms. He hasn't tried the aleve for the past two days because they were given to him by a relative. He denies joint swelling, fever, chills, and any other symptoms. Denies hx of gout, DM, and heart issues. He notes that he is a on a CPAP machine and he is on disability.    Past Medical History  Diagnosis Date  . Pulmonary embolism   . Bipolar 1 disorder   . Subdural hematoma   . Sleep apnea    Past Surgical History  Procedure Laterality Date  . Appendectomy    . Cystectomy      right head  . Craniotomy N/A 01/19/2013    Procedure: CRANIOTOMY HEMATOMA EVACUATION SUBDURAL;  Surgeon: Hewitt Shorts, MD;  Location: [facility];  Service: Neurosurgery;  Laterality: N/A;   Family History  Problem Relation Age of Onset  . Hypertension Mother    History  Substance Use Topics  . Smoking status: Never Smoker   . Smokeless tobacco: Not on file  . Alcohol Use: No    Review of Systems  Constitutional: Negative for fever and chills.  Musculoskeletal: Positive for myalgias and arthralgias. Negative for joint swelling.  Neurological: Positive for weakness and  numbness.      Allergies  Review of patient's allergies indicates no known allergies.  Home Medications   Prior to Admission medications   Medication Sig Start Date End Date Taking? Authorizing Provider  lithium 600 MG capsule Take 600 mg by mouth at bedtime.    Historical Provider, MD  lithium carbonate (ESKALITH) 450 MG CR tablet Take 450 mg by mouth every morning.    Historical Provider, MD  mirtazapine (REMERON) 45 MG tablet Take 45 mg by mouth at bedtime.    Historical Provider, MD  QUEtiapine (SEROQUEL) 400 MG tablet Take 800 mg by mouth at bedtime.    Historical Provider, MD   BP 121/82 mmHg  Pulse 100  Temp(Src) 99.1 F (37.3 C) (Oral)  Resp 18  SpO2 96%  Physical Exam  Constitutional: He is oriented to person, place, and time. He appears well-developed and well-nourished. No distress.  HENT:  Head: Normocephalic and atraumatic.  Eyes: EOM are normal.  Neck: Neck supple. No tracheal deviation present.  Cardiovascular: Normal rate.   Pulmonary/Chest: Effort normal. No respiratory distress.  Musculoskeletal: Normal range of motion.       Right elbow: He exhibits no swelling and no deformity.  No swelling or obvious deformity. No warmth to the area. Cap refill good. Grip strength equal. Muscle strength 5/5. Full  ROM of the right elbow and arm.  Full ROM of right shoulder and right wrist. Pulse and sensation intact. NVI. No rash.  Neurological: He is alert and oriented to person, place, and time.  Skin: Skin is warm and dry.  Psychiatric: He has a normal mood and affect. His behavior is normal.  Nursing note and vitals reviewed.   ED Course  Procedures (including critical care time) DIAGNOSTIC STUDIES: Oxygen Saturation is 96% on room air, normal by my interpretation.    COORDINATION OF CARE: 10:47 AM-Discussed treatment plan which includes F/U with orthopedist, sleeve, and muscle relaxer, Ibuprofen, elevate, and rest with pt at bedside and pt agreed to plan.    10:46 AM- No suspicion of Gout, joint infection, or fracture. Return precautions given to the patient. Pt is NVI, and have normal strength on exam. Low suspicion for DVT given that pain is specifically within the elbow and not in the forearm or upper arm.  No edema.    Labs Review Labs Reviewed - No data to display  Imaging Review No results found.   EKG Interpretation None      MDM   Final diagnoses:  Right elbow pain    BP 121/82 mmHg  Pulse 100  Temp(Src) 99.1 F (37.3 C) (Oral)  Resp 18  SpO2 96%   I personally performed the services described in this documentation, which was scribed in my presence. The recorded information has been reviewed and is accurate.    [name], PA-C 04/04/14 1051  Candyce Churn III, MD 04/05/14 [phone]

## 2014-04-04 NOTE — ED Provider Notes (Signed)
CSN: [id number]     Arrival date & time 04/04/14  1802 History   First MD Initiated Contact with Patient 04/04/14 1835     Chief Complaint  Patient presents with  . Migraine  . Elbow Pain     (Consider location/radiation/quality/duration/timing/severity/associated sxs/prior Treatment) HPI   Justin [name] Is a 49 year old male who presents the emergency department with chief complaint of right elbow pain and headache. He has a past medical history of pulmonary emboli. Patient developed a subdural hematoma while on warfarin, which required craniotomy and hematoma evacuation back in 2014. He is no longer on any anticoagulants. Was seen earlier today for evaluation of his elbow pain. He was given tramadol and an Ace wrap. Patient states that he took 1 tramadol and immediately began having a headache, which has progressively worsened throughout the day. He states it did nothing for his pain. He called to the emergency department a significant change. The medicine was told he would have to be reevaluated. Patient also complains of the pain in his elbow. She states hurts with any extension past 90. He also has pain with grasping and with pronation and supination. He denies any previous injuries, history of gout, heat, redness or swelling. Patient states that he does not usually get headaches and the last time he had a bad headache was when he had a subdural hematoma. He has some associated photophobia and phonophobia but denies any focal neurologic abnormalities, neck stiffness, IV drug use or recent procedures to back. Patient has an elevated temperature at 100.35F. He denies any other symptoms such as URI, urinary symptoms, abdominal pain, nausea, vomiting.  Past Medical History  Diagnosis Date  . Pulmonary embolism   . Bipolar 1 disorder   . Subdural hematoma   . Sleep apnea    Past Surgical History  Procedure Laterality Date  . Appendectomy    . Cystectomy      right head  . Craniotomy N/A  01/19/2013    Procedure: CRANIOTOMY HEMATOMA EVACUATION SUBDURAL;  Surgeon: Hewitt Shorts, MD;  Location: [facility];  Service: Neurosurgery;  Laterality: N/A;   Family History  Problem Relation Age of Onset  . Hypertension Mother    History  Substance Use Topics  . Smoking status: Never Smoker   . Smokeless tobacco: Not on file  . Alcohol Use: No    Review of Systems   Ten systems reviewed and are negative for acute change, except as noted in the HPI.   Allergies  Review of patient's allergies indicates no known allergies.  Home Medications   Prior to Admission medications   Medication Sig Start Date End Date Taking? Authorizing Provider  lithium 600 MG capsule Take 600 mg by mouth at bedtime.   Yes Historical Provider, MD  lithium carbonate (ESKALITH) 450 MG CR tablet Take 450 mg by mouth every morning.   Yes Historical Provider, MD  methocarbamol (ROBAXIN) 500 MG tablet Take 1 tablet (500 mg total) by mouth 2 (two) times daily. 04/04/14  Yes [name], PA-C  mirtazapine (REMERON) 45 MG tablet Take 45 mg by mouth at bedtime.   Yes Historical Provider, MD  QUEtiapine (SEROQUEL) 400 MG tablet Take 800 mg by mouth at bedtime.   Yes Historical Provider, MD   BP 142/83 mmHg  Pulse 88  Temp(Src) 99.5 F (37.5 C) (Oral)  Resp 20  SpO2 95% Physical Exam  Constitutional: He is oriented to person, place, and time. He appears well-developed and well-nourished. No distress.  HENT:  Head: Normocephalic and atraumatic.  Mouth/Throat: Oropharynx is clear and moist.  Eyes: Conjunctivae and EOM are normal. Pupils are equal, round, and reactive to light. No scleral icterus.  No horizontal, vertical or rotational nystagmus  Neck: Normal range of motion. Neck supple.  Full active and passive ROM without pain No midline or paraspinal tenderness No nuchal rigidity or meningeal signs  Cardiovascular: Normal rate, regular rhythm and intact distal pulses.   Pulmonary/Chest: Effort  normal and breath sounds normal. No respiratory distress. He has no wheezes. He has no rales.  Abdominal: Soft. Bowel sounds are normal. There is no tenderness. There is no rebound and no guarding.  Musculoskeletal: Normal range of motion.       Arms: Lymphadenopathy:    He has no cervical adenopathy.  Neurological: He is alert and oriented to person, place, and time. He has normal reflexes. No cranial nerve deficit. He exhibits normal muscle tone. Coordination normal.  Mental Status:  Alert, oriented, thought content appropriate. Speech fluent without evidence of aphasia. Able to follow 2 step commands without difficulty.  Cranial Nerves:  II:  Peripheral visual fields grossly normal, pupils equal, round, reactive to light III,IV, VI: ptosis not present, extra-ocular motions intact bilaterally  V,VII: smile symmetric, facial light touch sensation equal VIII: hearing grossly normal bilaterally  IX,X: gag reflex present  XI: bilateral shoulder shrug equal and strong XII: midline tongue extension  Motor:  5/5 in upper and lower extremities bilaterally including strong and equal grip strength and dorsiflexion/plantar flexion Sensory: Pinprick and light touch normal in all extremities.  Deep Tendon Reflexes: 2+ and symmetric  Cerebellar: normal finger-to-nose with bilateral upper extremities Gait: normal gait and balance CV: distal pulses palpable throughout   Skin: Skin is warm and dry. No rash noted. He is not diaphoretic.  Psychiatric: He has a normal mood and affect. His behavior is normal. Judgment and thought content normal.  Nursing note and vitals reviewed.   ED Course  Procedures (including critical care time) Labs Review Labs Reviewed  CBC - Abnormal; Notable for the following:    WBC 12.0 (*)    Hemoglobin 12.8 (*)    HCT 37.2 (*)    All other components within normal limits  BASIC METABOLIC PANEL - Abnormal; Notable for the following:    Glucose, Bld 102 (*)     Creatinine, Ser 1.52 (*)    GFR calc non Af Amer 52 (*)    GFR calc Af Amer 60 (*)    All other components within normal limits  URINALYSIS, ROUTINE W REFLEX MICROSCOPIC  LITHIUM LEVEL    Imaging Review No results found.   EKG Interpretation None      MDM   Final diagnoses:  Headache  Elbow pain, right    Patient here with headache and elbow pain. I do not have a clear explanation for his fever. We'll obtain a CT of the head considering his previous headache was during unexplained subdural hemorrhage, which is likely related to fact that he is on warfarin. He is on no anticoagulants at this time. We'll give the patient headache cocktail today.     Patient's headache is greatly improved after treatment. Upper pain is reduced as well. I have given the patient a sling. He has no heat, redness, warmth. His only got bony tenderness. Muscular tenderness as well in the right elbow. He is able to move the joint, although it is painful. It is negative. I doubt any other emergent causes  of his symptoms today. Temperature is unexplained. He appears safe for discharge at this point. Patient has a primary care physician with which he is instructed to follow closely.    Arthor Captain, PA-C 04/05/14 1617  Gwyneth Sprout, MD 04/07/14 1719

## 2014-04-04 NOTE — ED Notes (Signed)
Declined W/C at D/C and was escorted to lobby by RN. 

## 2014-04-04 NOTE — Discharge Instructions (Signed)
RICE: Routine Care for Injuries The routine care of many injuries includes Rest, Ice, Compression, and Elevation (RICE). HOME CARE INSTRUCTIONS  Rest is needed to allow your body to heal. Routine activities can usually be resumed when comfortable. Injured tendons and bones can take up to 6 weeks to heal. Tendons are the cord-like structures that attach muscle to bone.  Ice following an injury helps keep the swelling down and reduces pain.  Put ice in a plastic bag.  Place a towel between your skin and the bag.  Leave the ice on for 15-20 minutes, 3-4 times a day, or as directed by your health care provider. Do this while awake, for the first 24 to 48 hours. After that, continue as directed by your caregiver.  Compression helps keep swelling down. It also gives support and helps with discomfort. If an elastic bandage has been applied, it should be removed and reapplied every 3 to 4 hours. It should not be applied tightly, but firmly enough to keep swelling down. Watch fingers or toes for swelling, bluish discoloration, coldness, numbness, or excessive pain. If any of these problems occur, remove the bandage and reapply loosely. Contact your caregiver if these problems continue.  Elevation helps reduce swelling and decreases pain. With extremities, such as the arms, hands, legs, and feet, the injured area should be placed near or above the level of the heart, if possible. SEEK IMMEDIATE MEDICAL CARE IF:  You have persistent pain and swelling.  You develop redness, numbness, or unexpected weakness.  Your symptoms are getting worse rather than improving after several days. These symptoms may indicate that further evaluation or further X-rays are needed. Sometimes, X-rays may not show a small broken bone (fracture) until 1 week or 10 days later. Make a follow-up appointment with your caregiver. Ask when your X-ray results will be ready. Make sure you get your X-ray results. Document Released:  06/17/2000 Document Revised: 03/10/2013 Document Reviewed: 08/04/2010 [employer] Patient Information 2015 [employer]. This information is not intended to replace advice given to you by your health care provider. Make sure you discuss any questions you have with your health care provider.  

## 2014-04-04 NOTE — ED Notes (Signed)
Pt reports pain to right elbow and forearm x 1 week, denies injury. Pain with movement and gripping objects.

## 2014-04-04 NOTE — ED Notes (Addendum)
Pt. Reports was here today and given tramadol for elbow pain, states after he took the medication he has a HA. Denies blurred vision or light/noise sensitivity. States tramadol is not helping with elbow pain and states that he needs a sling or wrap for it. Pt. Alert and oriented x4.

## 2014-04-04 NOTE — ED Notes (Signed)
Pt made aware to return if symptoms worsen or if any life threatening symptoms occur.   

## 2014-04-04 NOTE — Discharge Instructions (Signed)
You are having a headache. No specific cause was found today for your headache. It may have been a migraine or other cause of headache. Stress, anxiety, fatigue, and depression are common triggers for headaches. Your headache today does not appear to be life-threatening or require hospitalization, but often the exact cause of headaches is not determined in the emergency department. Therefore, follow-up with your doctor is very important to find out what may have caused your headache, and whether or not you need any further diagnostic testing or treatment. Sometimes headaches can appear benign (not harmful), but then more serious symptoms can develop which should prompt an immediate re-evaluation by your doctor or the emergency department. SEEK MEDICAL ATTENTION IF: You develop possible problems with medications prescribed.  The medications don't resolve your headache, if it recurs , or if you have multiple episodes of vomiting or can't take fluids. You have a change from the usual headache. RETURN IMMEDIATELY IF you develop a sudden, severe headache or confusion, become poorly responsive or faint, develop a fever above 100.20F or problem breathing, have a change in speech, vision, swallowing, or understanding, or develop new weakness, numbness, tingling, incoordination, or have a seizure.   Please return if you develop, heat redness  Or swelling of the left elbow. Follow up as directed.

## 2014-04-04 NOTE — ED Notes (Addendum)
Pt reports being seen today in ED for right elbow pain x1.5 weeks. Pt had elbow wrapped and was given tramadol.  Pt reports increased pain in elbow and states "the tramadol gave me a migraine, but that's not why im here."  Pt alert and oriented.

## 2014-05-28 ENCOUNTER — Other Ambulatory Visit: Payer: Self-pay | Admitting: Orthopaedic Surgery

## 2014-05-28 DIAGNOSIS — M25521 Pain in right elbow: Secondary | ICD-10-CM

## 2014-06-16 ENCOUNTER — Ambulatory Visit
Admission: RE | Admit: 2014-06-16 | Discharge: 2014-06-16 | Disposition: A | Discharge status: Home / Self Care | Payer: Medicaid Other | Source: Ambulatory Visit | Attending: Orthopaedic Surgery | Admitting: Orthopaedic Surgery

## 2014-06-16 DIAGNOSIS — M25521 Pain in right elbow: Secondary | ICD-10-CM

## 2014-08-13 ENCOUNTER — Encounter (HOSPITAL_COMMUNITY): Payer: Self-pay | Admitting: Physical Medicine and Rehabilitation

## 2014-08-13 ENCOUNTER — Emergency Department (HOSPITAL_COMMUNITY): Payer: Medicaid Other

## 2014-08-13 ENCOUNTER — Emergency Department (HOSPITAL_COMMUNITY)
Admission: EM | Admit: 2014-08-13 | Discharge: 2014-08-13 | Disposition: A | Discharge status: Home / Self Care | Payer: Medicaid Other | Attending: Emergency Medicine | Admitting: Emergency Medicine

## 2014-08-13 DIAGNOSIS — Z86711 Personal history of pulmonary embolism: Secondary | ICD-10-CM | POA: Insufficient documentation

## 2014-08-13 DIAGNOSIS — Z8679 Personal history of other diseases of the circulatory system: Secondary | ICD-10-CM | POA: Diagnosis not present

## 2014-08-13 DIAGNOSIS — S53401A Unspecified sprain of right elbow, initial encounter: Secondary | ICD-10-CM | POA: Diagnosis not present

## 2014-08-13 DIAGNOSIS — W109XXA Fall (on) (from) unspecified stairs and steps, initial encounter: Secondary | ICD-10-CM | POA: Insufficient documentation

## 2014-08-13 DIAGNOSIS — Z79899 Other long term (current) drug therapy: Secondary | ICD-10-CM | POA: Diagnosis not present

## 2014-08-13 DIAGNOSIS — Y9389 Activity, other specified: Secondary | ICD-10-CM | POA: Insufficient documentation

## 2014-08-13 DIAGNOSIS — G8918 Other acute postprocedural pain: Secondary | ICD-10-CM

## 2014-08-13 DIAGNOSIS — Y9289 Other specified places as the place of occurrence of the external cause: Secondary | ICD-10-CM | POA: Insufficient documentation

## 2014-08-13 DIAGNOSIS — Y998 Other external cause status: Secondary | ICD-10-CM | POA: Diagnosis not present

## 2014-08-13 DIAGNOSIS — S59901A Unspecified injury of right elbow, initial encounter: Secondary | ICD-10-CM | POA: Diagnosis present

## 2014-08-13 DIAGNOSIS — F319 Bipolar disorder, unspecified: Secondary | ICD-10-CM | POA: Insufficient documentation

## 2014-08-13 MED ORDER — NAPROXEN 500 MG PO TABS: 500.0000 mg | ORAL_TABLET | Freq: Two times a day (BID) | ORAL | Status: DC

## 2014-08-13 MED ORDER — OXYCODONE-ACETAMINOPHEN 5-325 MG PO TABS
1.0000 | ORAL_TABLET | Freq: Once | ORAL | Status: AC
Start: 2014-08-13 — End: 2014-08-13
  Administered 2014-08-13: 1 via ORAL
  Filled 2014-08-13: qty 1

## 2014-08-13 MED ORDER — OXYCODONE-ACETAMINOPHEN 5-325 MG PO TABS: 1.0000 | ORAL_TABLET | ORAL | Status: DC | PRN
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
## 2014-08-13 NOTE — ED Notes (Signed)
Pt presents to department for evaluation of R arm pain. States recent R elbow surgery, reports he accidentally fell and landed on R arm this morning. Now states swelling at incision site and increased pain. No obvious deformities noted.

## 2014-08-13 NOTE — Discharge Instructions (Signed)
Naprosyn for pain and inflammation. Oxycodone for severe pain. Follow up with primary care doctor and Dr. Jerl Santos. Keep arm elevated. Ice several times a day. Return if worsening

## 2014-08-25 ENCOUNTER — Ambulatory Visit (INDEPENDENT_AMBULATORY_CARE_PROVIDER_SITE_OTHER): Payer: Medicaid Other | Admitting: Pulmonary Disease

## 2014-08-25 ENCOUNTER — Encounter: Payer: Self-pay | Admitting: Pulmonary Disease

## 2014-08-25 VITALS — BP 122/74 | HR 93 | Temp 98.0°F | Ht 69.0 in | Wt 241.6 lb

## 2014-08-25 DIAGNOSIS — G4733 Obstructive sleep apnea (adult) (pediatric): Secondary | ICD-10-CM | POA: Diagnosis not present

## 2014-08-25 NOTE — Progress Notes (Signed)
   Subjective:    Patient ID: Justin [name], male    DOB: 05-Aug-1965, 49 y.o.   MRN: [id number]  HPI Patient comes in today for follow-up of his obstructive sleep apnea. He is wearing C Pap compliantly by his download, and has good control of his AHI. He is not having significant mask leak, and is very satisfied with his sleep and daytime alertness.  He has lost 12 pounds since last visit.   Review of Systems  Constitutional: Negative for fever and unexpected weight change.  HENT: Negative for congestion, dental problem, ear pain, nosebleeds, postnasal drip, rhinorrhea, sinus pressure, sneezing, sore throat and trouble swallowing.   Eyes: Negative for redness and itching.  Respiratory: Negative for cough, chest tightness, shortness of breath and wheezing.   Cardiovascular: Negative for palpitations and leg swelling.  Gastrointestinal: Negative for nausea and vomiting.  Genitourinary: Negative for dysuria.  Musculoskeletal: Negative for joint swelling.  Skin: Negative for rash.  Neurological: Negative for headaches.  Hematological: Does not bruise/bleed easily.  Psychiatric/Behavioral: Negative for dysphoric mood. The patient is not nervous/anxious.        Objective:   Physical Exam Overweight male in no acute distress Nose without purulence or discharge noted No skin breakdown or pressure necrosis from the C Pap mask Neck without lymphadenopathy or thyromegaly Lower extremities without edema, no cyanosis Alert and oriented, moves all 4 extremities.       Assessment & Plan:

## 2014-08-25 NOTE — Patient Instructions (Signed)
Continue on cpap, and keep up with mask changes and supplies. Keep working on weight loss.  You are doing well. followup with Dr. Craige Cotta in one year.

## 2014-08-25 NOTE — Assessment & Plan Note (Signed)
The pt is doing very well with cpap by his download, and is very satisfied with his sleep and daytime alertness. He is slowly losing weight, and is working toward his ideal body weight. I have asked him to keep up with mask changes and supplies, and to follow-up in one year.

## 2014-09-24 ENCOUNTER — Emergency Department (HOSPITAL_COMMUNITY): Payer: Medicaid Other

## 2014-09-24 ENCOUNTER — Encounter (HOSPITAL_COMMUNITY): Payer: Self-pay | Admitting: *Deleted

## 2014-09-24 ENCOUNTER — Emergency Department (HOSPITAL_COMMUNITY)
Admission: EM | Admit: 2014-09-24 | Discharge: 2014-09-24 | Disposition: A | Discharge status: Home / Self Care | Payer: Medicaid Other | Attending: Emergency Medicine | Admitting: Emergency Medicine

## 2014-09-24 DIAGNOSIS — Z791 Long term (current) use of non-steroidal anti-inflammatories (NSAID): Secondary | ICD-10-CM | POA: Insufficient documentation

## 2014-09-24 DIAGNOSIS — F319 Bipolar disorder, unspecified: Secondary | ICD-10-CM | POA: Diagnosis not present

## 2014-09-24 DIAGNOSIS — S0990XA Unspecified injury of head, initial encounter: Secondary | ICD-10-CM | POA: Insufficient documentation

## 2014-09-24 DIAGNOSIS — Y998 Other external cause status: Secondary | ICD-10-CM | POA: Diagnosis not present

## 2014-09-24 DIAGNOSIS — Y9389 Activity, other specified: Secondary | ICD-10-CM | POA: Diagnosis not present

## 2014-09-24 DIAGNOSIS — Y9289 Other specified places as the place of occurrence of the external cause: Secondary | ICD-10-CM | POA: Insufficient documentation

## 2014-09-24 DIAGNOSIS — Z79899 Other long term (current) drug therapy: Secondary | ICD-10-CM | POA: Diagnosis not present

## 2014-09-24 DIAGNOSIS — S59901A Unspecified injury of right elbow, initial encounter: Secondary | ICD-10-CM

## 2014-09-24 DIAGNOSIS — Z86711 Personal history of pulmonary embolism: Secondary | ICD-10-CM | POA: Diagnosis not present

## 2014-09-24 DIAGNOSIS — Z87828 Personal history of other (healed) physical injury and trauma: Secondary | ICD-10-CM | POA: Insufficient documentation

## 2014-09-24 MED ORDER — NAPROXEN 500 MG PO TABS: 500.0000 mg | ORAL_TABLET | Freq: Two times a day (BID) | ORAL | Status: DC

## 2014-09-24 NOTE — Discharge Instructions (Signed)
Assault, General °Assault includes any behavior, whether intentional or reckless, which results in bodily injury to another person and/or damage to property. Included in this would be any behavior, intentional or reckless, that by its nature would be understood (interpreted) by a reasonable person as intent to harm another person or to damage his/her property. Threats may be oral or written. They may be communicated through regular mail, computer, fax, or phone. These threats may be direct or implied. °FORMS OF ASSAULT INCLUDE: °· Physically assaulting a person. This includes physical threats to inflict physical harm as well as: °¨ Slapping. °¨ Hitting. °¨ Poking. °¨ Kicking. °¨ Punching. °¨ Pushing. °· Arson. °· Sabotage. °· Equipment vandalism. °· Damaging or destroying property. °· Throwing or hitting objects. °· Displaying a weapon or an object that appears to be a weapon in a threatening manner. °¨ Carrying a firearm of any kind. °¨ Using a weapon to harm someone. °· Using greater physical size/strength to intimidate another. °¨ Making intimidating or threatening gestures. °¨ Bullying. °¨ Hazing. °· Intimidating, threatening, hostile, or abusive language directed toward another person. °¨ It communicates the intention to engage in violence against that person. And it leads a reasonable person to expect that violent behavior may occur. °· Stalking another person. °IF IT HAPPENS AGAIN: °· Immediately call for emergency help (911 in [place] °· If someone poses clear and immediate danger to you, seek legal authorities to have a protective or restraining order put in place. °· Less threatening assaults can at least be reported to authorities. °STEPS TO TAKE IF A SEXUAL ASSAULT HAS HAPPENED °· Go to an area of safety. This may include a shelter or staying with a friend. Stay away from the area where you have been attacked. A large percentage of sexual assaults are caused by a friend, relative or associate. °· If  medications were given by your caregiver, take them as directed for the full length of time prescribed. °· Only take over-the-counter or prescription medicines for pain, discomfort, or fever as directed by your caregiver. °· If you have come in contact with a sexual disease, find out if you are to be tested again. If your caregiver is concerned about the HIV/AIDS virus, he/she may require you to have continued testing for several months. °· For the protection of your privacy, test results can not be given over the phone. Make sure you receive the results of your test. If your test results are not back during your visit, make an appointment with your caregiver to find out the results. Do not assume everything is normal if you have not heard from your caregiver or the medical facility. It is important for you to follow up on all of your test results. °· File appropriate papers with authorities. This is important in all assaults, even if it has occurred in a family or by a friend. °SEEK MEDICAL CARE IF: °· You have new problems because of your injuries. °· You have problems that may be because of the medicine you are taking, such as: °¨ Rash. °¨ Itching. °¨ Swelling. °¨ Trouble breathing. °· You develop belly (abdominal) pain, feel sick to your stomach (nausea) or are vomiting. °· You begin to run a temperature. °· You need supportive care or referral to a rape crisis center. These are centers with trained personnel who can help you get through this ordeal. °SEEK IMMEDIATE MEDICAL CARE IF: °· You are afraid of being threatened, beaten, or abused. In [place] call 911. °· You   receive new injuries related to abuse. °· You develop severe pain in any area injured in the assault or have any change in your condition that concerns you. °· You faint or lose consciousness. °· You develop chest pain or shortness of breath. °Document Released: 03/05/2005 Document Revised: 05/28/2011 Document Reviewed: 10/22/2007 °[employer]® Patient  Information ©2015 [employer]. This information is not intended to replace advice given to you by your health care provider. Make sure you discuss any questions you have with your health care provider. ° °RICE: Routine Care for Injuries °The routine care of many injuries includes Rest, Ice, Compression, and Elevation (RICE). °HOME CARE INSTRUCTIONS °· Rest is needed to allow your body to heal. Routine activities can usually be resumed when comfortable. Injured tendons and bones can take up to 6 weeks to heal. Tendons are the cord-like structures that attach muscle to bone. °· Ice following an injury helps keep the swelling down and reduces pain. °¨ Put ice in a plastic bag. °¨ Place a towel between your skin and the bag. °¨ Leave the ice on for 15-20 minutes, 3-4 times a day, or as directed by your health care provider. Do this while awake, for the first 24 to 48 hours. After that, continue as directed by your caregiver. °· Compression helps keep swelling down. It also gives support and helps with discomfort. If an elastic bandage has been applied, it should be removed and reapplied every 3 to 4 hours. It should not be applied tightly, but firmly enough to keep swelling down. Watch fingers or toes for swelling, bluish discoloration, coldness, numbness, or excessive pain. If any of these problems occur, remove the bandage and reapply loosely. Contact your caregiver if these problems continue. °· Elevation helps reduce swelling and decreases pain. With extremities, such as the arms, hands, legs, and feet, the injured area should be placed near or above the level of the heart, if possible. °SEEK IMMEDIATE MEDICAL CARE IF: °· You have persistent pain and swelling. °· You develop redness, numbness, or unexpected weakness. °· Your symptoms are getting worse rather than improving after several days. °These symptoms may indicate that further evaluation or further X-rays are needed. Sometimes, X-rays may not show a small  broken bone (fracture) until 1 week or 10 days later. Make a follow-up appointment with your caregiver. Ask when your X-ray results will be ready. Make sure you get your X-ray results. °Document Released: 06/17/2000 Document Revised: 03/10/2013 Document Reviewed: 08/04/2010 °Exit[employer]® Patient Information ©2015 [employer]. This information is not intended to replace advice given to you by your health care provider. Make sure you discuss any questions you have with your health care provider. ° °
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
## 2014-09-24 NOTE — ED Notes (Signed)
PA at BS.  
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
## 2014-10-11 ENCOUNTER — Ambulatory Visit: Payer: Medicaid Other | Admitting: Physical Therapy

## 2014-10-13 ENCOUNTER — Encounter (HOSPITAL_COMMUNITY): Payer: Self-pay | Admitting: Emergency Medicine

## 2014-10-13 ENCOUNTER — Emergency Department (HOSPITAL_COMMUNITY): Payer: Medicaid Other

## 2014-10-13 ENCOUNTER — Emergency Department (HOSPITAL_COMMUNITY)
Admission: EM | Admit: 2014-10-13 | Discharge: 2014-10-13 | Disposition: A | Discharge status: Home / Self Care | Payer: Medicaid Other | Attending: Emergency Medicine | Admitting: Emergency Medicine

## 2014-10-13 ENCOUNTER — Ambulatory Visit: Payer: Medicaid Other | Admitting: Physical Therapy

## 2014-10-13 DIAGNOSIS — Z791 Long term (current) use of non-steroidal anti-inflammatories (NSAID): Secondary | ICD-10-CM | POA: Diagnosis not present

## 2014-10-13 DIAGNOSIS — Y998 Other external cause status: Secondary | ICD-10-CM | POA: Diagnosis not present

## 2014-10-13 DIAGNOSIS — Z8679 Personal history of other diseases of the circulatory system: Secondary | ICD-10-CM | POA: Insufficient documentation

## 2014-10-13 DIAGNOSIS — Z8669 Personal history of other diseases of the nervous system and sense organs: Secondary | ICD-10-CM | POA: Diagnosis not present

## 2014-10-13 DIAGNOSIS — W1839XA Other fall on same level, initial encounter: Secondary | ICD-10-CM | POA: Insufficient documentation

## 2014-10-13 DIAGNOSIS — S86002A Unspecified injury of left Achilles tendon, initial encounter: Secondary | ICD-10-CM | POA: Diagnosis not present

## 2014-10-13 DIAGNOSIS — Z86711 Personal history of pulmonary embolism: Secondary | ICD-10-CM | POA: Diagnosis not present

## 2014-10-13 DIAGNOSIS — R202 Paresthesia of skin: Secondary | ICD-10-CM | POA: Insufficient documentation

## 2014-10-13 DIAGNOSIS — Z79899 Other long term (current) drug therapy: Secondary | ICD-10-CM | POA: Insufficient documentation

## 2014-10-13 DIAGNOSIS — F319 Bipolar disorder, unspecified: Secondary | ICD-10-CM | POA: Diagnosis not present

## 2014-10-13 DIAGNOSIS — Y92310 Basketball court as the place of occurrence of the external cause: Secondary | ICD-10-CM | POA: Insufficient documentation

## 2014-10-13 DIAGNOSIS — Y9367 Activity, basketball: Secondary | ICD-10-CM | POA: Insufficient documentation

## 2014-10-13 DIAGNOSIS — S99912A Unspecified injury of left ankle, initial encounter: Secondary | ICD-10-CM | POA: Diagnosis present

## 2014-10-13 DIAGNOSIS — T1490XA Injury, unspecified, initial encounter: Secondary | ICD-10-CM

## 2014-10-13 MED ORDER — OXYCODONE-ACETAMINOPHEN 5-325 MG PO TABS: ORAL_TABLET | ORAL | Status: DC

## 2014-10-13 MED ORDER — METHOCARBAMOL 500 MG PO TABS: 500.0000 mg | ORAL_TABLET | Freq: Two times a day (BID) | ORAL | Status: DC

## 2014-10-13 MED ORDER — METHOCARBAMOL 500 MG PO TABS
1000.0000 mg | ORAL_TABLET | Freq: Once | ORAL | Status: AC
  Administered 2014-10-13: 1000 mg via ORAL
  Filled 2014-10-13: qty 2

## 2014-10-13 MED ORDER — HYDROMORPHONE HCL 1 MG/ML IJ SOLN
0.5000 mg | Freq: Once | INTRAMUSCULAR | Status: AC
  Administered 2014-10-13: 0.5 mg via INTRAMUSCULAR
  Filled 2014-10-13: qty 1

## 2014-10-13 NOTE — Progress Notes (Signed)
Orthopedic Tech Progress Note Patient Details:  Justin [name] February 08, 1966 [id number]  Ortho Devices Type of Ortho Device: Ace wrap, Post (short leg) splint Ortho Device/Splint Location: LLE Ortho Device/Splint Interventions: Ordered, Application   Jennye Moccasin 10/13/2014, 9:00 PM

## 2014-10-13 NOTE — ED Notes (Signed)
Pt st's he was playing basketball and turned the wrong way, st's he felt a pop in left ankle.  Ice pack applied

## 2014-10-13 NOTE — ED Notes (Signed)
Ortho tech paged for splint.

## 2014-10-13 NOTE — ED Notes (Signed)
Pt. injured his left ankle while playing basketball this evening , reports pain with mild swelling pain radiating to left foot and calf.

## 2014-10-13 NOTE — ED Provider Notes (Signed)
CSN: [id number]     Arrival date & time 10/13/14  1911 History  This chart was scribed for Justin Emery, PA-C, working with Rolan Bucco, MD by Chestine Spore, ED Scribe. The patient was seen in room TR02C/TR02C at 8:03 PM.    Chief Complaint  Patient presents with  . Ankle Injury      The history is provided by the patient. No language interpreter was used.    HPI Comments: Justin [name] is a 49 y.o. male who presents to the Emergency Department complaining of left ankle injury onset tonight PTA. Pt notes that he injured his left achilles while playing basketball and he felt it pop and he fell after the incident. Pt is unable to put pressure on his left ankle since the incident. He states that he is having associated symptoms of left ankle pain, left ankle swelling, and tingling of left toes. Pt notes that his left ankle pain radiates to his left foot and left calf. He denies numbness, weakness, and any other symptoms. Denies allergies to any medications.    Past Medical History  Diagnosis Date  . Pulmonary embolism   . Bipolar 1 disorder   . Subdural hematoma   . Sleep apnea    Past Surgical History  Procedure Laterality Date  . Appendectomy    . Cystectomy      right head  . Craniotomy N/A 01/19/2013    Procedure: CRANIOTOMY HEMATOMA EVACUATION SUBDURAL;  Surgeon: Hewitt Shorts, MD;  Location: [facility];  Service: Neurosurgery;  Laterality: N/A;   Family History  Problem Relation Age of Onset  . Hypertension Mother    History  Substance Use Topics  . Smoking status: Never Smoker   . Smokeless tobacco: Not on file  . Alcohol Use: No    Review of Systems  Musculoskeletal: Positive for myalgias, joint swelling and arthralgias.  Skin: Negative for color change.  Neurological: Negative for weakness and numbness.       Tingling of left toes    A complete 10 system review of systems was obtained and all systems are negative except as noted in the HPI and PMH.     Allergies  Review of patient's allergies indicates no known allergies.  Home Medications   Prior to Admission medications   Medication Sig Start Date End Date Taking? Authorizing Provider  lithium 600 MG capsule Take 600 mg by mouth at bedtime.    Historical Provider, MD  lithium carbonate (ESKALITH) 450 MG CR tablet Take 450 mg by mouth every morning.    Historical Provider, MD  methocarbamol (ROBAXIN) 500 MG tablet Take 1 tablet (500 mg total) by mouth 2 (two) times daily. 04/04/14   [name], PA-C  mirtazapine (REMERON) 45 MG tablet Take 45 mg by mouth at bedtime.    Historical Provider, MD  naproxen (NAPROSYN) 500 MG tablet Take 1 tablet (500 mg total) by mouth 2 (two) times daily. 09/24/14   [name], PA-C  oxyCODONE-acetaminophen (PERCOCET) 5-325 MG per tablet Take 1 tablet by mouth every 4 (four) hours as needed for severe pain. 08/13/14   [name], PA-C  QUEtiapine (SEROQUEL) 400 MG tablet Take 800 mg by mouth at bedtime.    Historical Provider, MD   BP 147/96 mmHg  Pulse 78  Temp(Src) 98.3 F (36.8 C) (Oral)  Resp 20  SpO2 100% Physical Exam  Constitutional: He is oriented to person, place, and time. He appears well-developed and well-nourished. No distress.  HENT:  Head: Normocephalic and atraumatic.  Eyes: EOM are normal.  Neck: Neck supple. No tracheal deviation present.  Cardiovascular: Normal rate.   Pulmonary/Chest: Effort normal. No respiratory distress.  Musculoskeletal: Normal range of motion. He exhibits edema and tenderness.       Legs: Neurovascularly intact to left lower extremity, no overlying lacerations. No tenderness palpation along the bilateral malleoli, no foot tenderness, patient is exquisitely tender to palpation along the Achilles tendon, there is no appreciable depression, Thompson test is positive. Compartments are soft.  Neurological: He is alert and oriented to person, place, and time.  Skin: Skin is warm and dry.  Psychiatric: He  has a normal mood and affect. His behavior is normal.  Nursing note and vitals reviewed.   ED Course  Procedures (including critical care time) DIAGNOSTIC STUDIES: Oxygen Saturation is 99% on RA, nl by my interpretation.    COORDINATION OF CARE: 8:08 PM-Discussed treatment plan which includes left ankle x-ray with pt at bedside and pt agreed to plan.   Labs Review Labs Reviewed - No data to display  Imaging Review Dg Ankle Complete Left  10/13/2014   CLINICAL DATA:  Acute onset pain while playing basketball  EXAM: LEFT ANKLE COMPLETE - 3+ VIEW  COMPARISON:  None.  FINDINGS: Frontal, oblique, and lateral views were obtained. There is no demonstrable fracture or joint effusion. The ankle mortise appears intact. No appreciable joint space narrowing.  IMPRESSION: No demonstrable fracture.  Mortise intact.   Electronically Signed   By: Bretta Bang III M.D.   On: 10/13/2014 20:07     EKG Interpretation None      MDM   Final diagnoses:  Achilles tendon injury, left, initial encounter    Filed Vitals:   10/13/14 2026 10/13/14 2109  BP: 165/95 147/96  Pulse: 81 78  Temp: 98.2 F (36.8 C) 98.3 F (36.8 C)  TempSrc: Oral Oral  Resp: 18 20  SpO2: 99% 100%    Medications  HYDROmorphone (DILAUDID) injection 0.5 mg (0.5 mg Intramuscular Given 10/13/14 2039)  methocarbamol (ROBAXIN) tablet 1,000 mg (1,000 mg Oral Given 10/13/14 2038)    [name] is a pleasant 49 y.o. male presenting with severe left posterior ankle pain, he is exquisitely tender to palpation along the Achilles, there is no deformity. Thompson test is positive, he is distally neurovascularly intact, no overlying skin changes. Acute severe onset of pain with soft compartments, doubt compartment syndrome. X are negative, orthopedic consult from Dr. Ophelia Charter appreciated: He recommends putting this patient in a posterior splint with the foot plantar flexed approximately 45.  Evaluation does not show pathology  that would require ongoing emergent intervention or inpatient treatment. Pt is hemodynamically stable and mentating appropriately. Discussed findings and plan with patient/guardian, who agrees with care plan. All questions answered. Return precautions discussed and outpatient follow up given.   I personally performed the services described in this documentation, which was scribed in my presence. The recorded information has been reviewed and is accurate.    Joni Reining Kahil Agner, PA-C 10/14/14 0981  Rolan Bucco, MD 10/14/14 2122

## 2014-10-13 NOTE — Discharge Instructions (Signed)
Rest, Ice intermittently (in the first 24-48 hours), and elevate (Limb above the level of the heart)   Take percocet for breakthrough pain, do not drink alcohol, drive, care for children or do other critical tasks while taking percocet.  Please follow with your primary care doctor in the next 2 days for a check-up. They must obtain records for further management.   Do not hesitate to return to the Emergency Department for any new, worsening or concerning symptoms.

## 2014-10-20 ENCOUNTER — Other Ambulatory Visit (HOSPITAL_COMMUNITY): Payer: Self-pay | Admitting: Orthopedic Surgery

## 2014-10-20 ENCOUNTER — Encounter (HOSPITAL_COMMUNITY): Payer: Self-pay | Admitting: *Deleted

## 2014-10-20 NOTE — Progress Notes (Signed)
Pt denies SOB, chest pain, and being under the care of a cardiologist. Pt denies having a stress test, echo and cardiac cath. Pt denies having a chest x ray and EKG within the last year. Pt made aware to stop taking otc vitamins and herbal medications. Do not take any NSAIDs ie: Ibuprofen, Advil, Naproxen or any medication containing Aspirin. Pt verbalized understanding of all pre-op instructions.

## 2014-10-21 ENCOUNTER — Encounter (HOSPITAL_COMMUNITY): Payer: Self-pay | Admitting: Certified Registered"

## 2014-10-21 ENCOUNTER — Encounter (HOSPITAL_COMMUNITY)
Admission: RE | Disposition: A | Discharge status: Home / Self Care | Payer: Self-pay | Source: Ambulatory Visit | Attending: Orthopedic Surgery

## 2014-10-21 ENCOUNTER — Ambulatory Visit (HOSPITAL_COMMUNITY): Payer: Medicaid Other | Admitting: Anesthesiology

## 2014-10-21 ENCOUNTER — Ambulatory Visit (HOSPITAL_COMMUNITY): Payer: Medicaid Other

## 2014-10-21 ENCOUNTER — Ambulatory Visit (HOSPITAL_COMMUNITY)
Admission: RE | Admit: 2014-10-21 | Discharge: 2014-10-22 | Disposition: A | Discharge status: Home / Self Care | Payer: Medicaid Other | Source: Ambulatory Visit | Attending: Orthopedic Surgery | Admitting: Orthopedic Surgery

## 2014-10-21 DIAGNOSIS — Y9367 Activity, basketball: Secondary | ICD-10-CM | POA: Diagnosis not present

## 2014-10-21 DIAGNOSIS — F319 Bipolar disorder, unspecified: Secondary | ICD-10-CM | POA: Diagnosis not present

## 2014-10-21 DIAGNOSIS — Z86711 Personal history of pulmonary embolism: Secondary | ICD-10-CM | POA: Diagnosis not present

## 2014-10-21 DIAGNOSIS — Z79899 Other long term (current) drug therapy: Secondary | ICD-10-CM | POA: Insufficient documentation

## 2014-10-21 DIAGNOSIS — G4730 Sleep apnea, unspecified: Secondary | ICD-10-CM | POA: Insufficient documentation

## 2014-10-21 DIAGNOSIS — S86012A Strain of left Achilles tendon, initial encounter: Secondary | ICD-10-CM | POA: Diagnosis not present

## 2014-10-21 DIAGNOSIS — X58XXXA Exposure to other specified factors, initial encounter: Secondary | ICD-10-CM | POA: Diagnosis not present

## 2014-10-21 DIAGNOSIS — Z6834 Body mass index (BMI) 34.0-34.9, adult: Secondary | ICD-10-CM | POA: Diagnosis not present

## 2014-10-21 HISTORY — DX: Pneumonia, unspecified organism: J18.9

## 2014-10-21 HISTORY — PX: ACHILLES TENDON SURGERY: SHX542

## 2014-10-21 HISTORY — DX: Strain of left Achilles tendon, initial encounter: S86.012A

## 2014-10-21 LAB — COMPREHENSIVE METABOLIC PANEL
ALT: 40 U/L (ref 17–63)
ANION GAP: 6 (ref 5–15)
AST: 37 U/L (ref 15–41)
Albumin: 4.2 g/dL (ref 3.5–5.0)
Alkaline Phosphatase: 113 U/L (ref 38–126)
BILIRUBIN TOTAL: 0.7 mg/dL (ref 0.3–1.2)
BUN: 8 mg/dL (ref 6–20)
CO2: 25 mmol/L (ref 22–32)
CREATININE: 1.29 mg/dL — AB (ref 0.61–1.24)
Calcium: 9.1 mg/dL (ref 8.9–10.3)
Chloride: 109 mmol/L (ref 101–111)
GFR calc non Af Amer: >60 mL/min (ref >60)
GLUCOSE: 115 mg/dL — AB (ref 65–99)
POTASSIUM: 3.7 mmol/L (ref 3.5–5.1)
SODIUM: 140 mmol/L (ref 135–145)
Total Protein: 7.3 g/dL (ref 6.5–8.1)

## 2014-10-21 LAB — APTT: APTT: 29 s (ref 24–37)

## 2014-10-21 LAB — CBC
HCT: 40 % (ref 39.0–52.0)
Hemoglobin: 13.8 g/dL (ref 13.0–17.0)
MCH: 29.2 pg (ref 26.0–34.0)
MCHC: 34.5 g/dL (ref 30.0–36.0)
MCV: 84.6 fL (ref 78.0–100.0)
Platelets: 264 10*3/uL (ref 150–400)
RBC: 4.73 MIL/uL (ref 4.22–5.81)
RDW: 14 % (ref 11.5–15.5)
WBC: 7.2 10*3/uL (ref 4.0–10.5)

## 2014-10-21 LAB — PROTIME-INR
INR: 1.06 (ref 0.00–1.49)
Prothrombin Time: 14 seconds (ref 11.6–15.2)

## 2014-10-21 SURGERY — REPAIR, TENDON, ACHILLES
Anesthesia: Regional | Site: Ankle | Laterality: Left

## 2014-10-21 MED ORDER — ASPIRIN EC 325 MG PO TBEC: 325.0000 mg | DELAYED_RELEASE_TABLET | Freq: Every day | ORAL | Status: DC

## 2014-10-21 MED ORDER — CEFAZOLIN SODIUM-DEXTROSE 2-3 GM-% IV SOLR
2.0000 g | INTRAVENOUS | Status: AC
  Administered 2014-10-21: 2 g via INTRAVENOUS

## 2014-10-21 MED ORDER — ONDANSETRON HCL 4 MG/2ML IJ SOLN
INTRAMUSCULAR | Status: AC
  Filled 2014-10-21: qty 2

## 2014-10-21 MED ORDER — ONDANSETRON HCL 4 MG/2ML IJ SOLN
4.0000 mg | Freq: Four times a day (QID) | INTRAMUSCULAR | Status: DC | PRN
  Administered 2014-10-21: 4 mg via INTRAVENOUS
  Filled 2014-10-21: qty 2

## 2014-10-21 MED ORDER — LIDOCAINE HCL (CARDIAC) 20 MG/ML IV SOLN
INTRAVENOUS | Status: AC
  Filled 2014-10-21: qty 5

## 2014-10-21 MED ORDER — FENTANYL CITRATE (PF) 100 MCG/2ML IJ SOLN
INTRAMUSCULAR | Status: AC
  Administered 2014-10-21: 50 ug
  Filled 2014-10-21: qty 2

## 2014-10-21 MED ORDER — LITHIUM CARBONATE 300 MG PO CAPS
600.0000 mg | ORAL_CAPSULE | Freq: Every day | ORAL | Status: DC
  Administered 2014-10-21: 600 mg via ORAL
  Filled 2014-10-21 (×2): qty 2

## 2014-10-21 MED ORDER — MIDAZOLAM HCL 5 MG/5ML IJ SOLN
INTRAMUSCULAR | Status: DC | PRN
  Administered 2014-10-21: 2 mg via INTRAVENOUS

## 2014-10-21 MED ORDER — PROMETHAZINE HCL 25 MG/ML IJ SOLN: 6.2500 mg | INTRAMUSCULAR | Status: DC | PRN

## 2014-10-21 MED ORDER — CEFAZOLIN SODIUM 1-5 GM-% IV SOLN
1.0000 g | Freq: Four times a day (QID) | INTRAVENOUS | Status: AC
  Administered 2014-10-21 – 2014-10-22 (×3): 1 g via INTRAVENOUS
  Filled 2014-10-21 (×4): qty 50

## 2014-10-21 MED ORDER — METHOCARBAMOL 1000 MG/10ML IJ SOLN
500.0000 mg | Freq: Four times a day (QID) | INTRAVENOUS | Status: DC | PRN
  Filled 2014-10-21: qty 5

## 2014-10-21 MED ORDER — PROPOFOL 10 MG/ML IV BOLUS
INTRAVENOUS | Status: DC | PRN
  Administered 2014-10-21: 250 mg via INTRAVENOUS

## 2014-10-21 MED ORDER — PROPOFOL 10 MG/ML IV BOLUS
INTRAVENOUS | Status: AC
  Filled 2014-10-21: qty 20

## 2014-10-21 MED ORDER — METOCLOPRAMIDE HCL 5 MG PO TABS: 5.0000 mg | ORAL_TABLET | Freq: Three times a day (TID) | ORAL | Status: DC | PRN

## 2014-10-21 MED ORDER — SODIUM CHLORIDE 0.9 % IV SOLN
INTRAVENOUS | Status: DC
  Administered 2014-10-21: 22:00:00 via INTRAVENOUS

## 2014-10-21 MED ORDER — FENTANYL CITRATE (PF) 250 MCG/5ML IJ SOLN
INTRAMUSCULAR | Status: AC
  Filled 2014-10-21: qty 5

## 2014-10-21 MED ORDER — MIDAZOLAM HCL 2 MG/2ML IJ SOLN
INTRAMUSCULAR | Status: AC
  Filled 2014-10-21: qty 4

## 2014-10-21 MED ORDER — CEFAZOLIN SODIUM-DEXTROSE 2-3 GM-% IV SOLR
INTRAVENOUS | Status: AC
  Filled 2014-10-21: qty 50

## 2014-10-21 MED ORDER — BUPIVACAINE-EPINEPHRINE (PF) 0.5% -1:200000 IJ SOLN
INTRAMUSCULAR | Status: DC | PRN
  Administered 2014-10-21: 30 mL via PERINEURAL

## 2014-10-21 MED ORDER — OXYCODONE HCL 5 MG/5ML PO SOLN: 5.0000 mg | Freq: Once | ORAL | Status: AC | PRN

## 2014-10-21 MED ORDER — ASPIRIN EC 325 MG PO TBEC
325.0000 mg | DELAYED_RELEASE_TABLET | Freq: Every day | ORAL | Status: DC
  Administered 2014-10-21 – 2014-10-22 (×2): 325 mg via ORAL
  Filled 2014-10-21 (×2): qty 1

## 2014-10-21 MED ORDER — QUETIAPINE FUMARATE 400 MG PO TABS
800.0000 mg | ORAL_TABLET | Freq: Every day | ORAL | Status: DC
  Administered 2014-10-21: 800 mg via ORAL
  Filled 2014-10-21 (×2): qty 2

## 2014-10-21 MED ORDER — OXYCODONE HCL 5 MG PO TABS
5.0000 mg | ORAL_TABLET | Freq: Once | ORAL | Status: AC | PRN
  Administered 2014-10-21: 5 mg via ORAL

## 2014-10-21 MED ORDER — HYDROMORPHONE HCL 1 MG/ML IJ SOLN
INTRAMUSCULAR | Status: AC
  Filled 2014-10-21: qty 1

## 2014-10-21 MED ORDER — METOCLOPRAMIDE HCL 5 MG/ML IJ SOLN: 5.0000 mg | Freq: Three times a day (TID) | INTRAMUSCULAR | Status: DC | PRN

## 2014-10-21 MED ORDER — 0.9 % SODIUM CHLORIDE (POUR BTL) OPTIME
TOPICAL | Status: DC | PRN
  Administered 2014-10-21: 1000 mL

## 2014-10-21 MED ORDER — LACTATED RINGERS IV SOLN
INTRAVENOUS | Status: DC
  Administered 2014-10-21 (×2): via INTRAVENOUS

## 2014-10-21 MED ORDER — CHLORHEXIDINE GLUCONATE 4 % EX LIQD
60.0000 mL | Freq: Once | CUTANEOUS | Status: DC
Start: 2014-10-21 — End: 2014-10-21

## 2014-10-21 MED ORDER — ACETAMINOPHEN 650 MG RE SUPP: 650.0000 mg | Freq: Four times a day (QID) | RECTAL | Status: DC | PRN

## 2014-10-21 MED ORDER — HYDROMORPHONE HCL 1 MG/ML IJ SOLN
0.2500 mg | INTRAMUSCULAR | Status: DC | PRN
  Administered 2014-10-21 (×3): 0.5 mg via INTRAVENOUS

## 2014-10-21 MED ORDER — OXYCODONE-ACETAMINOPHEN 5-325 MG PO TABS: 1.0000 | ORAL_TABLET | ORAL | Status: DC | PRN

## 2014-10-21 MED ORDER — FENTANYL CITRATE (PF) 100 MCG/2ML IJ SOLN
INTRAMUSCULAR | Status: DC | PRN
  Administered 2014-10-21: 50 ug via INTRAVENOUS

## 2014-10-21 MED ORDER — MIDAZOLAM HCL 2 MG/2ML IJ SOLN
INTRAMUSCULAR | Status: AC
  Filled 2014-10-21: qty 2

## 2014-10-21 MED ORDER — ONDANSETRON HCL 4 MG/2ML IJ SOLN
INTRAMUSCULAR | Status: DC | PRN
  Administered 2014-10-21: 4 mg via INTRAVENOUS

## 2014-10-21 MED ORDER — MIRTAZAPINE 45 MG PO TABS
45.0000 mg | ORAL_TABLET | Freq: Every day | ORAL | Status: DC
  Administered 2014-10-21: 45 mg via ORAL
  Filled 2014-10-21 (×2): qty 1

## 2014-10-21 MED ORDER — METHOCARBAMOL 500 MG PO TABS
500.0000 mg | ORAL_TABLET | Freq: Four times a day (QID) | ORAL | Status: DC | PRN
  Administered 2014-10-21 – 2014-10-22 (×2): 500 mg via ORAL
  Filled 2014-10-21 (×2): qty 1

## 2014-10-21 MED ORDER — HYDROMORPHONE HCL 1 MG/ML IJ SOLN: 1.0000 mg | INTRAMUSCULAR | Status: DC | PRN

## 2014-10-21 MED ORDER — HYDROMORPHONE HCL 1 MG/ML IJ SOLN
INTRAMUSCULAR | Status: AC
  Administered 2014-10-21: 0.5 mg via INTRAVENOUS
  Filled 2014-10-21: qty 1

## 2014-10-21 MED ORDER — MIDAZOLAM HCL 2 MG/2ML IJ SOLN
INTRAMUSCULAR | Status: AC
  Administered 2014-10-21: 2 mg
  Filled 2014-10-21: qty 2

## 2014-10-21 MED ORDER — OXYCODONE HCL 5 MG PO TABS
ORAL_TABLET | ORAL | Status: AC
  Filled 2014-10-21: qty 1

## 2014-10-21 MED ORDER — ACETAMINOPHEN 325 MG PO TABS: 650.0000 mg | ORAL_TABLET | Freq: Four times a day (QID) | ORAL | Status: DC | PRN

## 2014-10-21 MED ORDER — OXYCODONE HCL 5 MG PO TABS
5.0000 mg | ORAL_TABLET | ORAL | Status: DC | PRN
  Administered 2014-10-22: 10 mg via ORAL
  Filled 2014-10-21 (×3): qty 2

## 2014-10-21 MED ORDER — ROCURONIUM BROMIDE 50 MG/5ML IV SOLN
INTRAVENOUS | Status: AC
  Filled 2014-10-21: qty 1

## 2014-10-21 MED ORDER — ONDANSETRON HCL 4 MG PO TABS: 4.0000 mg | ORAL_TABLET | Freq: Four times a day (QID) | ORAL | Status: DC | PRN

## 2014-10-21 SURGICAL SUPPLY — 38 items
BNDG COHESIVE 6X5 TAN STRL LF (GAUZE/BANDAGES/DRESSINGS) ×2 IMPLANT
BNDG ESMARK 4X9 LF (GAUZE/BANDAGES/DRESSINGS) IMPLANT
BNDG GAUZE ELAST 4 BULKY (GAUZE/BANDAGES/DRESSINGS) ×2 IMPLANT
BNDG GAUZE STRTCH 6 (GAUZE/BANDAGES/DRESSINGS) ×2 IMPLANT
CANISTER SUCTION 2500CC (MISCELLANEOUS) ×2 IMPLANT
COTTON STERILE ROLL (GAUZE/BANDAGES/DRESSINGS) ×2 IMPLANT
COVER SURGICAL LIGHT HANDLE (MISCELLANEOUS) ×4 IMPLANT
CUFF TOURNIQUET SINGLE 34IN LL (TOURNIQUET CUFF) IMPLANT
CUFF TOURNIQUET SINGLE 44IN (TOURNIQUET CUFF) IMPLANT
DRAPE INCISE IOBAN 66X45 STRL (DRAPES) ×2 IMPLANT
DRAPE U-SHAPE 47X51 STRL (DRAPES) ×2 IMPLANT
DRSG ADAPTIC 3X8 NADH LF (GAUZE/BANDAGES/DRESSINGS) ×2 IMPLANT
DRSG PAD ABDOMINAL 8X10 ST (GAUZE/BANDAGES/DRESSINGS) ×2 IMPLANT
DURAPREP 26ML APPLICATOR (WOUND CARE) ×2 IMPLANT
ELECT REM PT RETURN 9FT ADLT (ELECTROSURGICAL) ×2
ELECTRODE REM PT RTRN 9FT ADLT (ELECTROSURGICAL) ×1 IMPLANT
GAUZE SPONGE 4X4 12PLY STRL (GAUZE/BANDAGES/DRESSINGS) ×2 IMPLANT
GLOVE BIOGEL PI IND STRL 9 (GLOVE) ×1 IMPLANT
GLOVE BIOGEL PI INDICATOR 9 (GLOVE) ×1
GLOVE SURG ORTHO 9.0 STRL STRW (GLOVE) ×2 IMPLANT
GOWN STRL REUS W/ TWL XL LVL3 (GOWN DISPOSABLE) ×3 IMPLANT
GOWN STRL REUS W/TWL XL LVL3 (GOWN DISPOSABLE) ×3
KIT ROOM TURNOVER OR (KITS) ×2 IMPLANT
NDL SUT .5 MAYO 1.404X.05X (NEEDLE) ×1 IMPLANT
NEEDLE MAYO TAPER (NEEDLE) ×1
NS IRRIG 1000ML POUR BTL (IV SOLUTION) ×2 IMPLANT
PACK ORTHO EXTREMITY (CUSTOM PROCEDURE TRAY) ×2 IMPLANT
PAD ARMBOARD 7.5X6 YLW CONV (MISCELLANEOUS) ×4 IMPLANT
SPONGE GAUZE 4X4 12PLY STER LF (GAUZE/BANDAGES/DRESSINGS) ×2 IMPLANT
SPONGE LAP 18X18 X RAY DECT (DISPOSABLE) ×2 IMPLANT
SUT ETHILON 2 0 PSLX (SUTURE) ×4 IMPLANT
SUT FIBERWIRE #2 38 T-5 BLUE (SUTURE)
SUTURE FIBERWR #2 38 T-5 BLUE (SUTURE) IMPLANT
TOWEL OR 17X24 6PK STRL BLUE (TOWEL DISPOSABLE) ×2 IMPLANT
TOWEL OR 17X26 10 PK STRL BLUE (TOWEL DISPOSABLE) ×2 IMPLANT
TUBE CONNECTING 12X1/4 (SUCTIONS) ×2 IMPLANT
WATER STERILE IRR 1000ML POUR (IV SOLUTION) ×2 IMPLANT
YANKAUER SUCT BULB TIP NO VENT (SUCTIONS) ×2 IMPLANT

## 2014-10-21 NOTE — Op Note (Signed)
10/21/2014  1:01 PM  PATIENT:  Justin [name]    PRE-OPERATIVE DIAGNOSIS:  Left Achilles Rupture  POST-OPERATIVE DIAGNOSIS:  Same  PROCEDURE:  Left Achilles Reconstruction  SURGEON:  Nadara Mustard, MD  PHYSICIAN ASSISTANT:None ANESTHESIA:   General  PREOPERATIVE INDICATIONS:  Justin [name] is a  49 y.o. male with a diagnosis of Left Achilles Rupture who failed conservative measures and elected for surgical management.    The risks benefits and alternatives were discussed with the patient preoperatively including but not limited to the risks of infection, bleeding, nerve injury, cardiopulmonary complications, the need for revision surgery, among others, and the patient was willing to proceed.  OPERATIVE IMPLANTS:Repair of the Achilles tendon with #2 FiberWire 4  OPERATIVE FINDINGS: Mid substance Achilles rupture  OPERATIVE PROCEDURE: Patient was brought to the operating room after undergoing a regional block he then underwent a general and aesthetic. After adequate levels anesthesia obtained patient's left lower extremity was prepped using DuraPrep draped into a sterile field. A timeout was called. A longitudinal posterior medial incision was made this carried down through the peritenon. The patient had significant disruption of the Achilles and the ends of the Achilles rupture were freshened. Using Krakw technique the #2 FiberWire were sutured through the proximal pole with 4 strands exiting the rupture site and this was also performed from the distal site. The 8 strands were tied in mid substance of the rupture with the foot plantarflexed. Patient had good range of motion the ankle with no instability repair. The wound was irrigated with normal saline. The peritenon was repaired the incision was closed using 2-0 nylon within an Allgower Donati suture technique. Patient was placed and his fracture boot and plantarflexion after a sterile compressive dressing was applied. Patient was  extubated taken to the PACU in stable condition. Patient has requested overnight observation and we will allow him to be discharged in the morning. Prescriptions for Percocet for pain on the chart.

## 2014-10-21 NOTE — Evaluation (Addendum)
Physical Therapy Evaluation and Discharge Patient Details Name: Justin [name] MRN: [id number] DOB: [birth year] Today's Date: 10/21/2014   History of Present Illness  49 y.o. male s/p Left Achilles Reconstruction  Clinical Impression  Patient evaluated by Physical Therapy with no further acute PT needs identified. All education has been completed and the patient has no further questions. Practiced safe transfer and ambulatory techniques. Maintains NWB on LLE at all times. No overt loss of balance with use of crutches. Will have family available initially at discharge to assist at home. See below for any follow-up Physial Therapy or equipment needs. PT is signing off. Thank you for this referral.     Follow Up Recommendations  (Outpatient PT follow up when cleared by MD)    Equipment Recommendations  None recommended by PT    Recommendations for Other Services       Precautions / Restrictions Precautions Precautions: Fall Required Braces or Orthoses: Other Brace/Splint Other Brace/Splint: CAM boot Restrictions Weight Bearing Restrictions: Yes LLE Weight Bearing: Non weight bearing      Mobility  Bed Mobility Overal bed mobility: Independent                Transfers Overall transfer level: Needs assistance Equipment used: Crutches Transfers: Sit to/from Stand Sit to Stand: Supervision         General transfer comment: Supervision for safety. Pt attempted technique he was used to prior to surgery but required to sit back on bed due to poor balance. Educated patient on use of holding crutches in Lt hand to rise then brining crutch under arms. VC for hand placement. Much more stable when performed this way.  Ambulation/Gait Ambulation/Gait assistance: Supervision Ambulation Distance (Feet): 115 Feet Assistive device: Crutches Gait Pattern/deviations: Step-to pattern (2 pt gait pattern) Gait velocity: decreased Gait velocity interpretation: Below normal speed for  age/gender General Gait Details: Educated on safe use of crutches. Height adjusted appropriately. VC for sequencing and placement under arms, out of axilla. No loss of balance, and maintains NWB through LLE at all times.  Stairs Stairs:  (declined to practice, prefers to scoot on buttocks)          Wheelchair Mobility    Modified Rankin (Stroke Patients Only)       Balance Overall balance assessment: Needs assistance Sitting-balance support: No upper extremity supported;Feet supported Sitting balance-Leahy Scale: Normal     Standing balance support: Single extremity supported Standing balance-Leahy Scale: Poor                               Pertinent Vitals/Pain Pain Assessment: No/denies pain    Home Living Family/patient expects to be discharged to:: Private residence Living Arrangements: Alone Available Help at Discharge: Family;Friend(s);Available PRN/intermittently Type of Home: Apartment Home Access: Level entry     Home Layout: Two level Home Equipment: Crutches Additional Comments: States he has been scooting up/down stairs on buttocks without issues    Prior Function Level of Independence: Independent               Hand Dominance   Dominant Hand: Left    Extremity/Trunk Assessment   Upper Extremity Assessment: Defer to OT evaluation           Lower Extremity Assessment: LLE deficits/detail   LLE Deficits / Details: limited due to immobilization in CAM. No sensation in toes, unable to voluntarily move.     Communication   Communication: No difficulties  Cognition Arousal/Alertness: Awake/alert Behavior During Therapy: WFL for tasks assessed/performed Overall Cognitive Status: Within Functional Limits for tasks assessed                      General Comments General comments (skin integrity, edema, etc.): States he has arranged girlfriend and another family member to assist at home initially. Discussed home safety,  navigating stairs (which he declined to practice) and prefers to scoot on buttocks up/down steps.    Exercises        Assessment/Plan    PT Assessment Patent does not need any further PT services  PT Diagnosis Abnormality of gait;Acute pain   PT Problem List    PT Treatment Interventions     PT Goals (Current goals can be found in the Care Plan section) Acute Rehab PT Goals Patient Stated Goal: Go home PT Goal Formulation: All assessment and education complete, DC therapy    Frequency     Barriers to discharge        Co-evaluation               End of Session Equipment Utilized During Treatment: Gait belt Activity Tolerance: Patient tolerated treatment well Patient left: in chair;with call bell/phone within reach Nurse Communication: Mobility status    Functional Assessment Tool Used: clinical observation Functional Limitation: Mobility: Walking and moving around Mobility: Walking and Moving Around Current Status (V3710): At least 1 percent but less than 20 percent impaired, limited or restricted Mobility: Walking and Moving Around Goal Status [phone]): At least 1 percent but less than 20 percent impaired, limited or restricted Mobility: Walking and Moving Around Discharge Status [phone]): At least 1 percent but less than 20 percent impaired, limited or restricted    Time: 1720-1742 PT Time Calculation (min) (ACUTE ONLY): 22 min   Charges:   PT Evaluation $Initial PT Evaluation Tier I: 1 Procedure     PT G Codes:   PT G-Codes **NOT FOR INPATIENT CLASS** Functional Assessment Tool Used: clinical observation Functional Limitation: Mobility: Walking and moving around Mobility: Walking and Moving Around Current Status (V0350): At least 1 percent but less than 20 percent impaired, limited or restricted Mobility: Walking and Moving Around Goal Status [phone]): At least 1 percent but less than 20 percent impaired, limited or restricted Mobility: Walking and Moving  Around Discharge Status [phone]): At least 1 percent but less than 20 percent impaired, limited or restricted    Berton Mount 10/21/2014, 6:47 PM [address] [facility] [place], [place] [phone]  Addendum for missed G-code

## 2014-10-21 NOTE — Anesthesia Preprocedure Evaluation (Addendum)
Anesthesia Evaluation  Patient identified by MRN, date of birth, ID band Patient awake    Reviewed: Allergy & Precautions, NPO status , Patient's Chart, lab work & pertinent test results  History of Anesthesia Complications Negative for: history of anesthetic complications  Airway Mallampati: III  TM Distance: >3 FB Neck ROM: Full    Dental  (+) Teeth Intact, Dental Advisory Given   Pulmonary sleep apnea and Continuous Positive Airway Pressure Ventilation ,  breath sounds clear to auscultation        Cardiovascular negative cardio ROS  Rhythm:Regular Rate:Normal     Neuro/Psych PSYCHIATRIC DISORDERS Bipolar Disorder negative neurological ROS     GI/Hepatic negative GI ROS, Neg liver ROS,   Endo/Other  Morbid obesity  Renal/GU negative Renal ROS     Musculoskeletal   Abdominal   Peds  Hematology Hx of PE   Anesthesia Other Findings   Reproductive/Obstetrics negative OB ROS                           Anesthesia Physical Anesthesia Plan  ASA: II  Anesthesia Plan: General and Regional   Post-op Pain Management: GA combined w/ Regional for post-op pain   Induction: Intravenous  Airway Management Planned: Oral ETT  Additional Equipment:   Intra-op Plan:   Post-operative Plan: Extubation in OR  Informed Consent: I have reviewed the patients History and Physical, chart, labs and discussed the procedure including the risks, benefits and alternatives for the proposed anesthesia with the patient or authorized representative who has indicated his/her understanding and acceptance.   Dental advisory given  Plan Discussed with: CRNA  Anesthesia Plan Comments:         Anesthesia Quick Evaluation

## 2014-10-21 NOTE — Anesthesia Postprocedure Evaluation (Signed)
  Anesthesia Post-op Note  Patient: Justin [name]  Procedure(s) Performed: Procedure(s): Left Achilles Reconstruction (Left)  Patient Location: PACU  Anesthesia Type:GA combined with regional for post-op pain  Level of Consciousness: awake, alert  and oriented  Airway and Oxygen Therapy: Patient Spontanous Breathing  Post-op Pain: mild  Post-op Assessment: Post-op Vital signs reviewed LLE Motor Response: No movement due to regional block LLE Sensation: Numbness (pop block)          Post-op Vital Signs: Reviewed  Last Vitals:  Filed Vitals:   10/21/14 1439  BP:   Pulse: 65  Temp:   Resp: 12    Complications: No apparent anesthesia complications

## 2014-10-21 NOTE — Transfer of Care (Signed)
Immediate Anesthesia Transfer of Care Note  Patient: Justin [name]  Procedure(s) Performed: Procedure(s): Left Achilles Reconstruction (Left)  Patient Location: PACU  Anesthesia Type:General and Regional  Level of Consciousness: awake, alert , oriented and pateint uncooperative  Airway & Oxygen Therapy: Patient Spontanous Breathing  Post-op Assessment: Report given to RN and Post -op Vital signs reviewed and stable  Post vital signs: Reviewed and stable  Last Vitals:  Filed Vitals:   10/21/14 1304  BP: 129/72  Pulse: 78  Temp: 36.5 C  Resp: 14    Complications: No apparent anesthesia complications

## 2014-10-21 NOTE — Anesthesia Procedure Notes (Addendum)
Anesthesia Regional Block:  Popliteal block  Pre-Anesthetic Checklist: ,, timeout performed, Correct Patient, Correct Site, Correct Laterality, Correct Procedure, Correct Position, site marked, Risks and benefits discussed,  Surgical consent,  Pre-op evaluation,  At surgeon's request and post-op pain management  Laterality: Left  Prep: chloraprep       Needles:  Injection technique: Single-shot  Needle Type: Echogenic Stimulator Needle     Needle Length: 9cm 9 cm Needle Gauge: 21 and 21 G    Additional Needles:  Procedures: ultrasound guided (picture in chart) and nerve stimulator Popliteal block  Nerve Stimulator or Paresthesia:  Response: tibial, 0.5 mA,  Response: peroneal, 0.5 mA,   Additional Responses:   Narrative:  Start time: 10/21/2014 11:30 AM End time: 10/21/2014 11:40 AM Injection made incrementally with aspirations every 5 mL.  Performed by: Personally  Anesthesiologist: Marcene Duos  Additional Notes: Risks and benefits discussed. Pt tolerated well with no immediate complications.   Procedure Name: LMA Insertion Date/Time: 10/21/2014 12:25 PM Performed by: Arlice Colt B Pre-anesthesia Checklist: Patient identified, Emergency Drugs available, Suction available, Patient being monitored and Timeout performed Patient Re-evaluated:Patient Re-evaluated prior to inductionOxygen Delivery Method: Circle system utilized Preoxygenation: Pre-oxygenation with 100% oxygen Intubation Type: IV induction LMA: LMA inserted LMA Size: 5.0 Number of attempts: 1 Placement Confirmation: positive ETCO2 and breath sounds checked- equal and bilateral Tube secured with: Tape Dental Injury: Teeth and Oropharynx as per pre-operative assessment

## 2014-10-21 NOTE — Progress Notes (Signed)
Orthopedic Tech Progress Note Patient Details:  Justin [name] January 18, 1966 [id number] Patient place in cam walker after surgery per Dr. Lajoyce Corners note  Patient ID: Justin [name], male   DOB: 1965/10/22, 49 y.o.   MRN: [id number]   Justin [name] 10/21/2014, 11:19 PM

## 2014-10-21 NOTE — H&P (Signed)
Justin [name] is an 49 y.o. male.   Chief Complaint: Acute left Achilles tendon rupture HPI: Patient is 49 year old gentleman who was playing basketball and sustained an acute Achilles tendon rupture  Past Medical History  Diagnosis Date  . Pulmonary embolism   . Bipolar 1 disorder   . Subdural hematoma   . Sleep apnea     wears CPAP  . Achilles rupture, left   . Pneumonia     Past Surgical History  Procedure Laterality Date  . Appendectomy    . Cystectomy      right head  . Craniotomy N/A 01/19/2013    Procedure: CRANIOTOMY HEMATOMA EVACUATION SUBDURAL;  Surgeon: Hewitt Shorts, MD;  Location: [facility];  Service: Neurosurgery;  Laterality: N/A;  . Elbow surgery      right  . Fracture surgery      finger    Family History  Problem Relation Age of Onset  . Hypertension Mother   . Multiple sclerosis Sister    Social History:  reports that he has never smoked. He has never used smokeless tobacco. He reports that he does not drink alcohol or use illicit drugs.  Allergies: No Known Allergies  No prescriptions prior to admission    No results found for this or any previous visit (from the past 48 hour(s)). No results found.  Review of Systems  All other systems reviewed and are negative.   There were no vitals taken for this visit. Physical Exam  Compression of the calf reproduces no plantar flexion of the foot. Patient has a palpable defect of the Achilles. Assessment/Plan Assessment: Acute left Achilles tendon rupture.  Plan: We will plan for reconstruction of the left Achilles tendon. Risks and benefits were discussed patient states he understands and wishes to proceed at this time.  [name] V 10/21/2014, 6:28 AM

## 2014-10-22 ENCOUNTER — Encounter (HOSPITAL_COMMUNITY): Payer: Self-pay | Admitting: Orthopedic Surgery

## 2014-10-22 DIAGNOSIS — S86012A Strain of left Achilles tendon, initial encounter: Secondary | ICD-10-CM | POA: Diagnosis not present

## 2014-10-25 NOTE — Discharge Summary (Signed)
Physician Discharge Summary  Patient ID: Justin [name] MRN: [id number] DOB/AGE: 07-08-1965 49 y.o.  Admit date: 10/21/2014 Discharge date: 10/25/2014  Admission Diagnoses: Left Achilles tendon rupture  Discharge Diagnoses:  Active Problems:   Achilles rupture, left   Discharged Condition: stable  Hospital Course: Patient underwent left Achilles tendon reconstruction state overnight and was discharged to home.  Consults: None  Significant Diagnostic Studies: labs: Routine labs  Treatments: surgery: See operative note  Discharge Exam: Blood pressure 122/81, pulse 88, temperature 98.2 F (36.8 C), temperature source Oral, resp. rate 16, height  (1.753 m), weight 107.049 kg (236 lb), SpO2 97 %. Incision/Wound: dressing clean and dry  Disposition: 01-Home or Self Care  Discharge Instructions    Call MD / Call 911    Complete by:  As directed   If you experience chest pain or shortness of breath, CALL 911 and be transported to the hospital emergency room.  If you develope a fever above 101 F, pus (white drainage) or increased drainage or redness at the wound, or calf pain, call your surgeon's office.     Call MD / Call 911    Complete by:  As directed   If you experience chest pain or shortness of breath, CALL 911 and be transported to the hospital emergency room.  If you develope a fever above 101.5 F, pus (white drainage) or increased drainage or redness at the wound, or calf pain, call your surgeon's office.     Constipation Prevention    Complete by:  As directed   Drink plenty of fluids.  Prune juice may be helpful.  You may use a stool softener, such as Colace (over the counter) 100 mg twice a day.  Use MiraLax (over the counter) for constipation as needed.     Constipation Prevention    Complete by:  As directed   Drink plenty of fluids.  Prune juice may be helpful.  You may use a stool softener, such as Colace (over the counter) 100 mg twice a day.  Use MiraLax (over the  counter) for constipation as needed.     Diet - low sodium heart healthy    Complete by:  As directed      Diet - low sodium heart healthy    Complete by:  As directed      Diet general    Complete by:  As directed      Driving restrictions    Complete by:  As directed   No driving while taking narcotic pain meds.     Increase activity slowly as tolerated    Complete by:  As directed      Increase activity slowly as tolerated    Complete by:  As directed             Medication List    TAKE these medications        aspirin EC 325 MG tablet  Take 1 tablet (325 mg total) by mouth daily.     lithium 600 MG capsule  Take 600 mg by mouth at bedtime.     methocarbamol 500 MG tablet  Commonly known as:  ROBAXIN  Take 1 tablet (500 mg total) by mouth 2 (two) times daily.     mirtazapine 45 MG tablet  Commonly known as:  REMERON  Take 45 mg by mouth at bedtime.     naproxen 500 MG tablet  Commonly known as:  NAPROSYN  Take 1 tablet (500 mg total)  by mouth 2 (two) times daily.     oxyCODONE-acetaminophen 5-325 MG per tablet  Commonly known as:  PERCOCET/ROXICET  1 to 2 tabs PO q6hrs  PRN for pain     oxyCODONE-acetaminophen 5-325 MG per tablet  Commonly known as:  ROXICET  Take 1 tablet by mouth every 4 (four) hours as needed for severe pain.     QUEtiapine 400 MG tablet  Commonly known as:  SEROQUEL  Take 800 mg by mouth at bedtime.           Follow-up Information    Follow up with DUDA,MARCUS V, MD In 2 weeks.   Specialty:  Orthopedic Surgery   Contact information:   [address] [place] [address] [phone]       Signed: Nadara Mustard 10/25/2014, 7:27 PM

## 2014-10-29 NOTE — Addendum Note (Signed)
Addendum  created 10/29/14 1031 by Marcene Duos, MD   Modules edited: Anesthesia Attestations

## 2014-11-02 ENCOUNTER — Ambulatory Visit: Payer: Medicaid Other | Attending: Orthopaedic Surgery | Admitting: Physical Therapy

## 2015-03-03 ENCOUNTER — Encounter (HOSPITAL_COMMUNITY)
Admission: RE | Admit: 2015-03-03 | Discharge: 2015-03-03 | Disposition: A | Discharge status: Home / Self Care | Payer: Medicaid Other | Source: Ambulatory Visit | Attending: Oral Surgery | Admitting: Oral Surgery

## 2015-03-03 ENCOUNTER — Encounter (HOSPITAL_COMMUNITY): Payer: Self-pay

## 2015-03-03 DIAGNOSIS — K029 Dental caries, unspecified: Secondary | ICD-10-CM | POA: Insufficient documentation

## 2015-03-03 DIAGNOSIS — K0530 Chronic periodontitis, unspecified: Secondary | ICD-10-CM | POA: Insufficient documentation

## 2015-03-03 DIAGNOSIS — Z01812 Encounter for preprocedural laboratory examination: Secondary | ICD-10-CM | POA: Diagnosis not present

## 2015-03-03 HISTORY — DX: Dental caries, unspecified: K02.9

## 2015-03-03 LAB — CBC
HCT: 39.6 % (ref 39.0–52.0)
Hemoglobin: 13.1 g/dL (ref 13.0–17.0)
MCH: 28.9 pg (ref 26.0–34.0)
MCHC: 33.1 g/dL (ref 30.0–36.0)
MCV: 87.4 fL (ref 78.0–100.0)
Platelets: 257 10*3/uL (ref 150–400)
RBC: 4.53 MIL/uL (ref 4.22–5.81)
RDW: 14.1 % (ref 11.5–15.5)
WBC: 8.1 10*3/uL (ref 4.0–10.5)

## 2015-03-03 LAB — BASIC METABOLIC PANEL
ANION GAP: 10 (ref 5–15)
BUN: 6 mg/dL (ref 6–20)
CO2: 26 mmol/L (ref 22–32)
Calcium: 9.3 mg/dL (ref 8.9–10.3)
Chloride: 106 mmol/L (ref 101–111)
Creatinine, Ser: 1.27 mg/dL — ABNORMAL HIGH (ref 0.61–1.24)
GFR calc non Af Amer: >60 mL/min (ref >60)
Glucose, Bld: 167 mg/dL — ABNORMAL HIGH (ref 65–99)
POTASSIUM: 3.9 mmol/L (ref 3.5–5.1)
Sodium: 142 mmol/L (ref 135–145)

## 2015-03-03 NOTE — Pre-Procedure Instructions (Signed)
[name]  03/03/2015      [id number] [place], [place] - [address] [place] [address] Phone: [phone] Fax: [phone]  [id number] - [place], [place] - [address] [place] [address] Phone: [phone] Fax: [phone]  [id number] [place], [place] - [phone] [facility] [address] [facility] [address] [facility] [address] AT [facility] [address] [place] [address] Phone: [phone] Fax: [phone]    Your procedure is scheduled on Monday, March 07, 2015  Report to [facility] Admitting at 8;30 A.M.  Call this number if you have problems the morning of surgery:  [phone]   Remember:  Do not eat food or drink liquids after midnight Sunday, March 06, 2015  Take these medicines the morning of surgery with A SIP OF WATER : lithium carbonate (ESKALITH)  Stop taking Aspirin, vitamins, fish oil, and herbal medications. Do not take any NSAIDs ie: Ibuprofen, Advil, Naproxen or any medication containing Aspirin : stop now.   Do not wear jewelry, make-up or nail polish.  Do not wear lotions, powders, or perfumes.  You may not wear deodorant.  Do not shave 48 hours prior to surgery.  Men may shave face and neck.  Do not bring valuables to the hospital.  [facility] is not responsible for any belongings or valuables.  Contacts, dentures or bridgework may not be worn into surgery.  Leave your suitcase in the car.  After surgery it may be brought to your room.  For patients admitted to the hospital, discharge time will be determined by your treatment team.  Patients discharged the day of surgery will not be allowed to drive home.   Name and phone number of your driver:    Special instructions:  [place] - Preparing for Surgery  Before surgery, you can play an important role.  Because skin is not sterile, your skin needs to be as free of germs as possible.  You can  reduce the number of germs on you skin by washing with CHG (chlorahexidine gluconate) soap before surgery.  CHG is an antiseptic cleaner which kills germs and bonds with the skin to continue killing germs even after washing.  Please DO NOT use if you have an allergy to CHG or antibacterial soaps.  If your skin becomes reddened/irritated stop using the CHG and inform your nurse when you arrive at Short Stay.  Do not shave (including legs and underarms) for at least 48 hours prior to the first CHG shower.  You may shave your face.  Please follow these instructions carefully:   1.  Shower with CHG Soap the night before surgery and the morning of Surgery.  2.  If you choose to wash your hair, wash your hair first as usual with your normal shampoo.  3.  After you shampoo, rinse your hair and body thoroughly to remove the Shampoo.  4.  Use CHG as you would any other liquid soap.  You can apply chg directly  to the skin and wash gently with scrungie or a clean washcloth.  5.  Apply the CHG Soap to your body ONLY FROM THE NECK DOWN.  Do not use on open wounds or open sores.  Avoid contact with your eyes, ears, mouth and genitals (private parts).  Wash genitals (private parts) with your normal soap.  6.  Wash thoroughly, paying special attention to the area where your surgery will be performed.  7.  Thoroughly rinse your body with warm water from the neck down.  8.  DO NOT shower/wash with your normal soap after using and rinsing off the CHG Soap.  9.  Pat yourself dry with a clean towel.            10.  Wear clean pajamas.            11.  Place clean sheets on your bed the night of your first shower and do not sleep with pets.  Day of Surgery  Do not apply any lotions/deodorants the morning of surgery.  Please wear clean clothes to the hospital/surgery center.  Please read over the following fact sheets that you were given. Pain Booklet, Coughing and Deep Breathing and Surgical Site Infection  Prevention

## 2015-03-03 NOTE — H&P (Signed)
HISTORY AND PHYSICAL  Justin [name] is a 49 y.o. male patient with CC: painful teeth. Referred by general dentist for multiple extractiohns  No diagnosis found.  Past Medical History  Diagnosis Date  . Pulmonary embolism   . Bipolar 1 disorder   . Subdural hematoma   . Sleep apnea     wears CPAP  . Achilles rupture, left   . Pneumonia     No current facility-administered medications for this encounter.   Current Outpatient Prescriptions  Medication Sig Dispense Refill  . aspirin EC 325 MG tablet Take 1 tablet (325 mg total) by mouth daily. 30 tablet 0  . lithium 600 MG capsule Take 600 mg by mouth at bedtime.    Marland Kitchen lithium carbonate (ESKALITH) 450 MG CR tablet Take 450 mg by mouth every morning.  0  . mirtazapine (REMERON) 45 MG tablet Take 45 mg by mouth at bedtime.    Marland Kitchen QUEtiapine (SEROQUEL) 400 MG tablet Take 800 mg by mouth at bedtime.     No Known Allergies Active Problems:   * No active hospital problems. *  Vitals: There were no vitals taken for this visit. Lab results:No results found for this or any previous visit (from the past 24 hour(s)). Radiology Results: No results found. General appearance: alert, cooperative and no distress Head: Normocephalic, without obvious abnormality, atraumatic Eyes: negative Nose: Nares normal. Septum midline. Mucosa normal. No drainage or sinus tenderness. Throat: multiple carious teeth; pharynx clear, no trismus Neck: no adenopathy, supple, symmetrical, trachea midline and thyroid not enlarged, symmetric, no tenderness/mass/nodules Resp: clear to auscultation bilaterally Cardio: regular rate and rhythm, S1, S2 normal, no murmur, click, rub or gallop  Assessment:Multiple nonrestorable teeth secondary to dental caries and periodontitis Plan:Multiple dental extractions with alveoloplasty. General anesthesia. Day surgery.   Georgia Lopes 03/03/2015

## 2015-03-03 NOTE — Progress Notes (Signed)
Pt denies SOB, chest pain, and being under the care of a cardiologist. Pt denies having a stress test, echo and cardiac cath. Spoke with [place], [place], anesthesia, regarding pre-op labs, okay to do a CBC and BMP. According to Sam, nursing staff at Dr. Randa Evens office, pt can stop taking Aspirin now and resume after procedure.

## 2015-03-06 MED ORDER — CEFAZOLIN SODIUM-DEXTROSE 2-3 GM-% IV SOLR
2.0000 g | INTRAVENOUS | Status: AC
  Administered 2015-03-07: 2 g via INTRAVENOUS
  Filled 2015-03-06 (×2): qty 50

## 2015-03-07 ENCOUNTER — Ambulatory Visit (HOSPITAL_COMMUNITY): Payer: Medicaid Other | Admitting: Certified Registered Nurse Anesthetist

## 2015-03-07 ENCOUNTER — Encounter (HOSPITAL_COMMUNITY)
Admission: RE | Disposition: A | Discharge status: Home / Self Care | Payer: Self-pay | Source: Ambulatory Visit | Attending: Oral Surgery

## 2015-03-07 ENCOUNTER — Encounter (HOSPITAL_COMMUNITY): Payer: Self-pay | Admitting: Certified Registered Nurse Anesthetist

## 2015-03-07 ENCOUNTER — Observation Stay (HOSPITAL_COMMUNITY)
Admission: RE | Admit: 2015-03-07 | Discharge: 2015-03-08 | Disposition: A | Discharge status: Home / Self Care | Payer: Medicaid Other | Source: Ambulatory Visit | Attending: Oral Surgery | Admitting: Oral Surgery

## 2015-03-07 DIAGNOSIS — Z79899 Other long term (current) drug therapy: Secondary | ICD-10-CM | POA: Insufficient documentation

## 2015-03-07 DIAGNOSIS — G4730 Sleep apnea, unspecified: Secondary | ICD-10-CM | POA: Diagnosis not present

## 2015-03-07 DIAGNOSIS — Z7982 Long term (current) use of aspirin: Secondary | ICD-10-CM | POA: Diagnosis not present

## 2015-03-07 DIAGNOSIS — K029 Dental caries, unspecified: Secondary | ICD-10-CM | POA: Diagnosis not present

## 2015-03-07 DIAGNOSIS — K0530 Chronic periodontitis, unspecified: Secondary | ICD-10-CM | POA: Diagnosis not present

## 2015-03-07 DIAGNOSIS — Z86711 Personal history of pulmonary embolism: Secondary | ICD-10-CM | POA: Diagnosis not present

## 2015-03-07 DIAGNOSIS — Z9989 Dependence on other enabling machines and devices: Secondary | ICD-10-CM | POA: Diagnosis not present

## 2015-03-07 DIAGNOSIS — G4733 Obstructive sleep apnea (adult) (pediatric): Secondary | ICD-10-CM | POA: Diagnosis present

## 2015-03-07 DIAGNOSIS — Z9889 Other specified postprocedural states: Secondary | ICD-10-CM

## 2015-03-07 DIAGNOSIS — F319 Bipolar disorder, unspecified: Secondary | ICD-10-CM | POA: Insufficient documentation

## 2015-03-07 HISTORY — PX: MULTIPLE EXTRACTIONS WITH ALVEOLOPLASTY: SHX5342

## 2015-03-07 SURGERY — MULTIPLE EXTRACTION WITH ALVEOLOPLASTY
Anesthesia: General | Site: Mouth

## 2015-03-07 MED ORDER — MIDAZOLAM HCL 2 MG/2ML IJ SOLN
INTRAMUSCULAR | Status: AC
  Filled 2015-03-07: qty 2

## 2015-03-07 MED ORDER — ONDANSETRON 4 MG PO TBDP: 4.0000 mg | ORAL_TABLET | Freq: Four times a day (QID) | ORAL | Status: DC | PRN

## 2015-03-07 MED ORDER — OXYCODONE HCL 5 MG PO TABS: 5.0000 mg | ORAL_TABLET | ORAL | Status: DC | PRN

## 2015-03-07 MED ORDER — 0.9 % SODIUM CHLORIDE (POUR BTL) OPTIME
TOPICAL | Status: DC | PRN
  Administered 2015-03-07: 1000 mL

## 2015-03-07 MED ORDER — HYDROMORPHONE HCL 1 MG/ML IJ SOLN
0.2500 mg | INTRAMUSCULAR | Status: DC | PRN
  Administered 2015-03-07: 0.5 mg via INTRAVENOUS

## 2015-03-07 MED ORDER — PHENYLEPHRINE HCL 10 MG/ML IJ SOLN
10.0000 mg | INTRAVENOUS | Status: DC | PRN
  Administered 2015-03-07: 40 ug/min via INTRAVENOUS

## 2015-03-07 MED ORDER — PROMETHAZINE HCL 25 MG/ML IJ SOLN: 6.2500 mg | INTRAMUSCULAR | Status: DC | PRN

## 2015-03-07 MED ORDER — HYDROMORPHONE HCL 1 MG/ML IJ SOLN
INTRAMUSCULAR | Status: AC
  Filled 2015-03-07: qty 1

## 2015-03-07 MED ORDER — DIPHENHYDRAMINE HCL 50 MG/ML IJ SOLN: 25.0000 mg | Freq: Four times a day (QID) | INTRAMUSCULAR | Status: DC | PRN

## 2015-03-07 MED ORDER — ONDANSETRON HCL 4 MG/2ML IJ SOLN
INTRAMUSCULAR | Status: AC
  Filled 2015-03-07: qty 2

## 2015-03-07 MED ORDER — LIDOCAINE HCL (CARDIAC) 20 MG/ML IV SOLN
INTRAVENOUS | Status: DC | PRN
  Administered 2015-03-07: 80 mg via INTRAVENOUS

## 2015-03-07 MED ORDER — ONDANSETRON HCL 4 MG/2ML IJ SOLN: 4.0000 mg | Freq: Four times a day (QID) | INTRAMUSCULAR | Status: DC | PRN

## 2015-03-07 MED ORDER — IBUPROFEN 600 MG PO TABS: 600.0000 mg | ORAL_TABLET | Freq: Four times a day (QID) | ORAL | Status: DC | PRN

## 2015-03-07 MED ORDER — DEXAMETHASONE SODIUM PHOSPHATE 4 MG/ML IJ SOLN
INTRAMUSCULAR | Status: DC | PRN
  Administered 2015-03-07: 8 mg via INTRAVENOUS

## 2015-03-07 MED ORDER — KETOROLAC TROMETHAMINE 30 MG/ML IJ SOLN: 30.0000 mg | Freq: Four times a day (QID) | INTRAMUSCULAR | Status: DC | PRN

## 2015-03-07 MED ORDER — PROPOFOL 10 MG/ML IV BOLUS
INTRAVENOUS | Status: DC | PRN
  Administered 2015-03-07: 200 mg via INTRAVENOUS

## 2015-03-07 MED ORDER — LIDOCAINE HCL (CARDIAC) 20 MG/ML IV SOLN
INTRAVENOUS | Status: AC
  Filled 2015-03-07: qty 5

## 2015-03-07 MED ORDER — SUCCINYLCHOLINE CHLORIDE 20 MG/ML IJ SOLN
INTRAMUSCULAR | Status: DC | PRN
  Administered 2015-03-07: 20 mg via INTRAVENOUS
  Administered 2015-03-07: 100 mg via INTRAVENOUS

## 2015-03-07 MED ORDER — HYDROMORPHONE HCL 1 MG/ML IJ SOLN
1.0000 mg | INTRAMUSCULAR | Status: DC | PRN
  Administered 2015-03-08: 1 mg via INTRAVENOUS
  Filled 2015-03-07: qty 1

## 2015-03-07 MED ORDER — PHENYLEPHRINE HCL 10 MG/ML IJ SOLN
INTRAMUSCULAR | Status: DC | PRN
  Administered 2015-03-07: 40 ug via INTRAVENOUS
  Administered 2015-03-07 (×3): 100 ug via INTRAVENOUS
  Administered 2015-03-07: 40 ug via INTRAVENOUS

## 2015-03-07 MED ORDER — SODIUM CHLORIDE 0.9 % IR SOLN
Status: DC | PRN
  Administered 2015-03-07: 1000 mL

## 2015-03-07 MED ORDER — OXYCODONE-ACETAMINOPHEN 5-325 MG PO TABS: 1.0000 | ORAL_TABLET | ORAL | Status: DC | PRN

## 2015-03-07 MED ORDER — LITHIUM CARBONATE ER 450 MG PO TBCR
450.0000 mg | EXTENDED_RELEASE_TABLET | ORAL | Status: DC
  Administered 2015-03-07 – 2015-03-08 (×2): 450 mg via ORAL
  Filled 2015-03-07 (×4): qty 1

## 2015-03-07 MED ORDER — FENTANYL CITRATE (PF) 250 MCG/5ML IJ SOLN
INTRAMUSCULAR | Status: AC
  Filled 2015-03-07: qty 5

## 2015-03-07 MED ORDER — KETOROLAC TROMETHAMINE 30 MG/ML IJ SOLN: 30.0000 mg | Freq: Four times a day (QID) | INTRAMUSCULAR | Status: DC

## 2015-03-07 MED ORDER — QUETIAPINE FUMARATE 100 MG PO TABS
800.0000 mg | ORAL_TABLET | Freq: Every day | ORAL | Status: DC
  Administered 2015-03-07: 800 mg via ORAL
  Filled 2015-03-07 (×2): qty 8

## 2015-03-07 MED ORDER — LACTATED RINGERS IV SOLN: INTRAVENOUS | Status: DC

## 2015-03-07 MED ORDER — OXYMETAZOLINE HCL 0.05 % NA SOLN
NASAL | Status: AC
  Filled 2015-03-07: qty 15

## 2015-03-07 MED ORDER — LIDOCAINE-EPINEPHRINE 2 %-1:100000 IJ SOLN
INTRAMUSCULAR | Status: AC
  Filled 2015-03-07: qty 1

## 2015-03-07 MED ORDER — PROPOFOL 10 MG/ML IV BOLUS
INTRAVENOUS | Status: AC
  Filled 2015-03-07: qty 20

## 2015-03-07 MED ORDER — SUCCINYLCHOLINE CHLORIDE 20 MG/ML IJ SOLN
INTRAMUSCULAR | Status: AC
  Filled 2015-03-07: qty 1

## 2015-03-07 MED ORDER — PHENYLEPHRINE 40 MCG/ML (10ML) SYRINGE FOR IV PUSH (FOR BLOOD PRESSURE SUPPORT)
PREFILLED_SYRINGE | INTRAVENOUS | Status: AC
  Filled 2015-03-07: qty 10

## 2015-03-07 MED ORDER — DEXTROSE-NACL 5-0.45 % IV SOLN
INTRAVENOUS | Status: DC
  Administered 2015-03-07: 16:00:00 via INTRAVENOUS

## 2015-03-07 MED ORDER — ONDANSETRON HCL 4 MG/2ML IJ SOLN
INTRAMUSCULAR | Status: DC | PRN
  Administered 2015-03-07: 4 mg via INTRAVENOUS

## 2015-03-07 MED ORDER — DIPHENHYDRAMINE HCL 25 MG PO CAPS: 25.0000 mg | ORAL_CAPSULE | Freq: Four times a day (QID) | ORAL | Status: DC | PRN

## 2015-03-07 MED ORDER — LACTATED RINGERS IV SOLN
INTRAVENOUS | Status: DC | PRN
Start: 2015-03-07 — End: 2015-03-07
  Administered 2015-03-07 (×2): via INTRAVENOUS

## 2015-03-07 MED ORDER — LITHIUM CARBONATE 300 MG PO CAPS
600.0000 mg | ORAL_CAPSULE | Freq: Every day | ORAL | Status: DC
  Administered 2015-03-07: 600 mg via ORAL
  Filled 2015-03-07 (×2): qty 2

## 2015-03-07 MED ORDER — LIDOCAINE-EPINEPHRINE 2 %-1:100000 IJ SOLN
INTRAMUSCULAR | Status: DC | PRN
  Administered 2015-03-07: 20 mL

## 2015-03-07 MED ORDER — KETOROLAC TROMETHAMINE 30 MG/ML IJ SOLN
30.0000 mg | Freq: Four times a day (QID) | INTRAMUSCULAR | Status: DC
  Administered 2015-03-07 – 2015-03-08 (×4): 30 mg via INTRAVENOUS
  Filled 2015-03-07 (×4): qty 1

## 2015-03-07 MED ORDER — MIRTAZAPINE 45 MG PO TABS
45.0000 mg | ORAL_TABLET | Freq: Every day | ORAL | Status: DC
  Administered 2015-03-07: 45 mg via ORAL
  Filled 2015-03-07 (×2): qty 1

## 2015-03-07 MED ORDER — FENTANYL CITRATE (PF) 100 MCG/2ML IJ SOLN
INTRAMUSCULAR | Status: DC | PRN
  Administered 2015-03-07: 100 ug via INTRAVENOUS

## 2015-03-07 MED ORDER — DEXAMETHASONE SODIUM PHOSPHATE 4 MG/ML IJ SOLN
INTRAMUSCULAR | Status: AC
  Filled 2015-03-07: qty 2

## 2015-03-07 MED ORDER — MIDAZOLAM HCL 5 MG/5ML IJ SOLN
INTRAMUSCULAR | Status: DC | PRN
  Administered 2015-03-07: 2 mg via INTRAVENOUS

## 2015-03-07 MED ORDER — HYDROMORPHONE HCL 1 MG/ML IJ SOLN: 1.0000 mg | INTRAMUSCULAR | Status: DC | PRN

## 2015-03-07 MED ORDER — OXYMETAZOLINE HCL 0.05 % NA SOLN
NASAL | Status: DC | PRN
  Administered 2015-03-07: 2 via NASAL

## 2015-03-07 SURGICAL SUPPLY — 27 items
BUR CROSS CUT FISSURE 1.6 (BURR) ×2 IMPLANT
BUR EGG ELITE 4.0 (BURR) ×2 IMPLANT
CANISTER SUCTION 2500CC (MISCELLANEOUS) ×2 IMPLANT
COVER SURGICAL LIGHT HANDLE (MISCELLANEOUS) ×2 IMPLANT
CRADLE DONUT ADULT HEAD (MISCELLANEOUS) ×2 IMPLANT
DECANTER SPIKE VIAL GLASS SM (MISCELLANEOUS) ×2 IMPLANT
FLUID NSS /IRRIG 1000 ML XXX (MISCELLANEOUS) ×2 IMPLANT
GAUZE PACKING FOLDED 2  STR (GAUZE/BANDAGES/DRESSINGS) ×1
GAUZE PACKING FOLDED 2 STR (GAUZE/BANDAGES/DRESSINGS) ×1 IMPLANT
GLOVE BIO SURGEON STRL SZ 6.5 (GLOVE) ×4 IMPLANT
GLOVE BIO SURGEON STRL SZ7.5 (GLOVE) ×2 IMPLANT
GLOVE BIOGEL PI IND STRL 7.0 (GLOVE) ×2 IMPLANT
GLOVE BIOGEL PI INDICATOR 7.0 (GLOVE) ×2
GOWN STRL REUS W/ TWL LRG LVL3 (GOWN DISPOSABLE) ×2 IMPLANT
GOWN STRL REUS W/ TWL XL LVL3 (GOWN DISPOSABLE) ×1 IMPLANT
GOWN STRL REUS W/TWL LRG LVL3 (GOWN DISPOSABLE) ×2
GOWN STRL REUS W/TWL XL LVL3 (GOWN DISPOSABLE) ×1
KIT BASIN OR (CUSTOM PROCEDURE TRAY) ×2 IMPLANT
KIT ROOM TURNOVER OR (KITS) ×2 IMPLANT
NEEDLE 22X1 1/2 (OR ONLY) (NEEDLE) ×4 IMPLANT
NS IRRIG 1000ML POUR BTL (IV SOLUTION) ×2 IMPLANT
PAD ARMBOARD 7.5X6 YLW CONV (MISCELLANEOUS) ×2 IMPLANT
SUT CHROMIC 3 0 PS 2 (SUTURE) ×4 IMPLANT
SYR CONTROL 10ML LL (SYRINGE) ×2 IMPLANT
TRAY ENT MC OR (CUSTOM PROCEDURE TRAY) ×2 IMPLANT
TUBING IRRIGATION (MISCELLANEOUS) ×2 IMPLANT
YANKAUER SUCT BULB TIP NO VENT (SUCTIONS) ×2 IMPLANT

## 2015-03-07 NOTE — H&P (Signed)
H&P documentation  -History and Physical Reviewed  -Patient has been re-examined  -No change in the plan of care  Justin [name] M  

## 2015-03-07 NOTE — Anesthesia Preprocedure Evaluation (Addendum)
Anesthesia Evaluation  Patient identified by MRN, date of birth, ID band Patient awake    Reviewed: Allergy & Precautions, NPO status , Patient's Chart, lab work & pertinent test results  Airway Mallampati: II  TM Distance: >3 FB Neck ROM: Full    Dental  (+) Dental Advisory Given   Pulmonary sleep apnea and Continuous Positive Airway Pressure Ventilation , pneumonia, PE   breath sounds clear to auscultation       Cardiovascular negative cardio ROS   Rhythm:Regular Rate:Normal     Neuro/Psych PSYCHIATRIC DISORDERS Bipolar Disorder    GI/Hepatic negative GI ROS, Neg liver ROS,   Endo/Other  Morbid obesity  Renal/GU negative Renal ROS     Musculoskeletal   Abdominal   Peds  Hematology   Anesthesia Other Findings   Reproductive/Obstetrics                            Anesthesia Physical Anesthesia Plan  ASA: III  Anesthesia Plan: General   Post-op Pain Management:    Induction: Intravenous  Airway Management Planned: Nasal ETT  Additional Equipment:   Intra-op Plan:   Post-operative Plan: Extubation in OR  Informed Consent: I have reviewed the patients History and Physical, chart, labs and discussed the procedure including the risks, benefits and alternatives for the proposed anesthesia with the patient or authorized representative who has indicated his/her understanding and acceptance.   Dental advisory given  Plan Discussed with: Anesthesiologist, Surgeon and CRNA  Anesthesia Plan Comments:         Anesthesia Quick Evaluation

## 2015-03-07 NOTE — Transfer of Care (Signed)
Immediate Anesthesia Transfer of Care Note  Patient: Justin [name]  Procedure(s) Performed: Procedure(s): MULTIPLE EXTRACTION WITH ALVEOLOPLASTY (N/A)  Patient Location: PACU  Anesthesia Type:General  Level of Consciousness: awake and alert   Airway & Oxygen Therapy: Patient Spontanous Breathing and Patient connected to face mask oxygen  Post-op Assessment: Report given to RN, Post -op Vital signs reviewed and stable and Patient moving all extremities X 4  Post vital signs: Reviewed and stable  Last Vitals:  Filed Vitals:   03/07/15 0848 03/07/15 0856  BP:  152/106  Pulse: 108   Temp: 36.4 C   Resp: 20     Complications: No apparent anesthesia complications

## 2015-03-07 NOTE — Op Note (Signed)
03/07/2015  10:19 AM  PATIENT:  Justin [name]  49 y.o. male  PRE-OPERATIVE DIAGNOSIS:  NON RESTORABLE TEETH # 3, 4, 5, 6, 7, 8, 9, 10, 11, 12, 13, 14, 15, 17, 19, 20, 28, 29  POST-OPERATIVE DIAGNOSIS:  SAME  PROCEDURE:  Procedure(s): MULTIPLE EXTRACTION TEETH # 3, 4, 5, 6, 7, 8, 9, 10, 11, 12, 13, 14, 15, 17, 19, 20, 28, 29 WITH ALVEOLOPLASTY  SURGEON:  Surgeon(s): [name], DDS  ANESTHESIA:   local and general  EBL:  minimal  DRAINS: none   SPECIMEN:  No Specimen  COUNTS:  YES  PLAN OF CARE: Discharge to home after PACU  PATIENT DISPOSITION:  PACU - hemodynamically stable.   PROCEDURE DETAILS: Dictation # [id number]  Georgia Lopes, DMD 03/07/2015 10:19 AM

## 2015-03-07 NOTE — Op Note (Signed)
Justin [name], Justin [name]                ACCOUNT NO.:  [id number]  MEDICAL RECORD NO.:  [id number]  LOCATION:  6N23C                        FACILITY:  [facility]  PHYSICIAN:  Georgia Lopes, M.D.  DATE OF BIRTH:  16-Jul-1965  DATE OF PROCEDURE:  03/07/2015 DATE OF DISCHARGE:                              OPERATIVE REPORT   PREOPERATIVE DIAGNOSIS:  Nonrestorable teeth numbers 3, 4, 5, 6, 7, 8, 9, 10, 11, 12, 13, 14, 15, 17, 19, 20, 28, 29.  POSTOPERATIVE DIAGNOSIS:  Nonrestorable teeth numbers 3, 4, 5, 6, 7, 8, 9, 10, 11, 12, 13, 14, 15, 17, 19, 20, 28, 29.  PROCEDURE:  Extraction of teeth numbers 3, 4, 5, 6, 7, 8, 9, 10, 11, 12, 13, 14, 15, 17, 19, 20, 28, 29.  Alveoplasty of right and left maxilla.  SURGEON:  Georgia Lopes, M.D.  ANESTHESIA:  General nasal, Dr. Randa Evens attending.  PROCEDURE IN DETAIL:  The patient was taken to the operating room, placed on the table in supine position.  General anesthesia administered intravenously and a nasal endotracheal tube was placed and secured.  The eyes were protected.  The patient was draped for the procedure.  Time- out was performed.  The posterior pharynx was suctioned, and a throat pack was placed.  A 2% lidocaine with 1:100,000 epinephrine was infiltrated in an inferior alveolar block on the right and left side and buccal and palatal infiltration in the maxilla.  Total of 18 mL was utilized.  A bite block was placed on the right side of the mouth and a sweetheart retractor was used to retract the tongue.  A 15 blade used to make an incision beginning at tooth #17 carrying forward to teeth numbers 19 and 20, both buccally and lingually, and then in the maxilla, the 15 blade was used to make an incision buccally and palatally from teeth # through #15.  The periosteum was reflected, the teeth were elevated with a 301 elevator and removed from the mouth with a dental forceps.  The sockets were curetted and then the periosteum was reflected  in the maxilla to expose the alveolar crest, alveoplasty was performed using an egg-shaped bur and bone file.  Then, the areas were irrigated and closed with 3-0 chromic.  The bite block and sweetheart retractor were repositioned to the other side of the mouth, and a 15 blade was used to make an incision around teeth numbers 3, 4, 5, and 6 in the maxilla and around teeth numbers 28 and 29 in the mandible.  The periosteum was reflected, the teeth were elevated and the lower teeth were removed with the Asch forceps.  The upper teeth numbers 3 and 4 were removed with the upper #150 forceps.  Teeth numbers 5 and 6 fractured upon attempted.  Additional bone was removed around these teeth.  Using the Stryker handpiece with a fissure bur under irrigation. Then, the teeth were removed using a 301 elevator and the upper universal forceps.  Then, the sockets were curetted.  The maxillary alveolus was reflected buccally to expose the alveolar crest, and then the egg-shaped bur and bone file were used to perform an alveoplasty, and then the  maxilla mandible on the right side were closed with 3-0 chromic.  The oral cavity was irrigated and suctioned.  The throat pack was removed.  The patient was awakened and taken to the recovery room, breathing spontaneously in good condition.  ESTIMATED BLOOD LOSS:  Minimal.  COMPLICATIONS:  None.  SPECIMENS:  None.     Georgia Lopes, M.D.     SMJ/MEDQ  D:  03/07/2015  T:  03/07/2015  Job:  [id number]

## 2015-03-07 NOTE — Anesthesia Postprocedure Evaluation (Signed)
Anesthesia Post Note  Patient: Justin [name]  Procedure(s) Performed: Procedure(s) (LRB): MULTIPLE EXTRACTION WITH ALVEOLOPLASTY (N/A)  Anesthesia Type: General Level of consciousness: awake Pain management: pain level controlled Vital Signs Assessment: post-procedure vital signs reviewed and stable Respiratory status: spontaneous breathing Cardiovascular status: stable Anesthetic complications: no    Last Vitals:  Filed Vitals:   03/07/15 1032 03/07/15 1049  BP: 141/99 139/104  Pulse: 114 108  Temp: 36 C   Resp: 13 13    Last Pain: There were no vitals filed for this visit.               EDWARDS,Kalep Full

## 2015-03-07 NOTE — Progress Notes (Signed)
Pt transferred from pacu this pm after having multiple teeth extraction. Alert,oriented and able to voice needs. Able to tolerate regular diet as ordered. No prn meds requested since transfer

## 2015-03-07 NOTE — Progress Notes (Signed)
Placed pt. On cpap. Pt. Tolerating well at this time. 

## 2015-03-07 NOTE — Anesthesia Procedure Notes (Addendum)
Procedure Name: Intubation Date/Time: 03/07/2015 9:50 AM Performed by: Rise Patience T Pre-anesthesia Checklist: Patient identified, Emergency Drugs available, Suction available and Patient being monitored Patient Re-evaluated:Patient Re-evaluated prior to inductionOxygen Delivery Method: Circle system utilized Preoxygenation: Pre-oxygenation with 100% oxygen Intubation Type: IV induction Ventilation: Mask ventilation without difficulty Laryngoscope Size: Barnet Pall, 3 and 2 Grade View: Grade I Nasal Tubes: Nasal Rae, Right, Nasal prep performed and Magill forceps- large, utilized Tube size: 7.5 mm Number of attempts: 2 Placement Confirmation: positive ETCO2,  CO2 detector,  breath sounds checked- equal and bilateral and ETT inserted through vocal cords under direct vision Tube secured with: Tape Dental Injury: Teeth and Oropharynx as per pre-operative assessment  Comments: Dl X 1 by SRNA with MAC 3 blade to obtain grade 4 view. DL x1 by CRNA with Hyacinth Meeker 2 to obtain grade 1 view. Atraumatic intubation

## 2015-03-08 ENCOUNTER — Encounter (HOSPITAL_COMMUNITY): Payer: Self-pay | Admitting: Oral Surgery

## 2015-03-08 DIAGNOSIS — K029 Dental caries, unspecified: Secondary | ICD-10-CM | POA: Diagnosis not present

## 2015-03-08 MED ORDER — OXYCODONE-ACETAMINOPHEN 10-325 MG PO TABS: 1.0000 | ORAL_TABLET | ORAL | Status: DC | PRN

## 2015-03-08 MED ORDER — IBUPROFEN 600 MG PO TABS: 600.0000 mg | ORAL_TABLET | Freq: Four times a day (QID) | ORAL | Status: DC | PRN

## 2015-03-08 NOTE — Progress Notes (Signed)
Patient discharged home in stable condition. Verbalizes understanding of all discharge instructions, including home medications and follow up appointments. 

## 2015-03-08 NOTE — Discharge Summary (Signed)
Patient ID: OVE AMAT MRN: [id number] DOB/AGE: 1965-05-30 49 y.o.  Admit date: 03/07/2015 Discharge date: 03/08/2015  Admission Diagnoses: Post op pain, Obstructive sleep apnea on cpap  Discharge Diagnoses:  Active Problems:   Obstructive sleep apnea on CPAP   Post-operative state   Discharged Condition: good  Hospital Course: Admitted for pain management post operatively. Discharged the next morning.  Consults: None  Significant Diagnostic Studies: labs:   Treatments: IV hydration and analgesia: Dilaudid  Discharge Exam: Blood pressure 135/90, pulse 94, temperature 98.5 F (36.9 C), temperature source Oral, resp. rate 18, SpO2 98 %. General appearance: alert, cooperative and no distress Head: Normocephalic, without obvious abnormality, atraumatic Eyes: negative Nose: Nares normal. Septum midline. Mucosa normal. No drainage or sinus tenderness. Throat: sutures intact, minimal edema, hemostatic. pharynx clear no trismus Resp: clear to auscultation bilaterally Cardio: regular rate and rhythm, S1, S2 normal, no murmur, click, rub or gallop  Disposition: 01-Home or Self Care  Discharge Instructions    Call MD for:  difficulty breathing, headache or visual disturbances    Complete by:  As directed      Call MD for:  persistant nausea and vomiting    Complete by:  As directed      Call MD for:  severe uncontrolled pain    Complete by:  As directed      Call MD for:  temperature >100.4    Complete by:  As directed      Diet - low sodium heart healthy    Complete by:  As directed   Soft Diet. Advance as tolerated.     Discharge instructions    Complete by:  As directed   Warm salt water mouth rinses 4-5 times per day starting the day after surgery. Ice to affected area for 2-3 days. Soft diet, advance as tolerated. No smoking for 2 weeks. Follow-up visit with Dr. Barbette Merino as scheduled. Call [phone] for problems.     Increase activity slowly    Complete by:  As  directed             Medication List    TAKE these medications        aspirin EC 325 MG tablet  Take 1 tablet (325 mg total) by mouth daily.     ibuprofen 600 MG tablet  Commonly known as:  ADVIL,MOTRIN  Take 1 tablet (600 mg total) by mouth every 6 (six) hours as needed for mild pain.     lithium carbonate 450 MG CR tablet  Commonly known as:  ESKALITH  Take 450 mg by mouth every morning.     lithium 600 MG capsule  Take 600 mg by mouth at bedtime.     mirtazapine 45 MG tablet  Commonly known as:  REMERON  Take 45 mg by mouth at bedtime.     oxyCODONE-acetaminophen 10-325 MG tablet  Commonly known as:  PERCOCET  Take 1-2 tablets by mouth every 4 (four) hours as needed for pain.     QUEtiapine 400 MG tablet  Commonly known as:  SEROQUEL  Take 800 mg by mouth at bedtime.         SignedGeorgia Lopes 03/08/2015, 7:47 AM

## 2015-03-31 ENCOUNTER — Encounter (HOSPITAL_COMMUNITY): Payer: Self-pay | Admitting: Emergency Medicine

## 2015-03-31 ENCOUNTER — Emergency Department (HOSPITAL_COMMUNITY)
Admission: EM | Admit: 2015-03-31 | Discharge: 2015-03-31 | Disposition: A | Discharge status: Home / Self Care | Payer: Medicaid Other | Attending: Physician Assistant | Admitting: Physician Assistant

## 2015-03-31 ENCOUNTER — Emergency Department (HOSPITAL_COMMUNITY): Payer: Medicaid Other

## 2015-03-31 DIAGNOSIS — Z9981 Dependence on supplemental oxygen: Secondary | ICD-10-CM | POA: Diagnosis not present

## 2015-03-31 DIAGNOSIS — F319 Bipolar disorder, unspecified: Secondary | ICD-10-CM | POA: Insufficient documentation

## 2015-03-31 DIAGNOSIS — Z8679 Personal history of other diseases of the circulatory system: Secondary | ICD-10-CM | POA: Diagnosis not present

## 2015-03-31 DIAGNOSIS — Z79899 Other long term (current) drug therapy: Secondary | ICD-10-CM | POA: Insufficient documentation

## 2015-03-31 DIAGNOSIS — Z8701 Personal history of pneumonia (recurrent): Secondary | ICD-10-CM | POA: Diagnosis not present

## 2015-03-31 DIAGNOSIS — Y9289 Other specified places as the place of occurrence of the external cause: Secondary | ICD-10-CM | POA: Insufficient documentation

## 2015-03-31 DIAGNOSIS — S86012A Strain of left Achilles tendon, initial encounter: Secondary | ICD-10-CM | POA: Insufficient documentation

## 2015-03-31 DIAGNOSIS — Z7982 Long term (current) use of aspirin: Secondary | ICD-10-CM | POA: Insufficient documentation

## 2015-03-31 DIAGNOSIS — S86011A Strain of right Achilles tendon, initial encounter: Secondary | ICD-10-CM

## 2015-03-31 DIAGNOSIS — Z8719 Personal history of other diseases of the digestive system: Secondary | ICD-10-CM | POA: Insufficient documentation

## 2015-03-31 DIAGNOSIS — Z86711 Personal history of pulmonary embolism: Secondary | ICD-10-CM | POA: Insufficient documentation

## 2015-03-31 DIAGNOSIS — G4730 Sleep apnea, unspecified: Secondary | ICD-10-CM | POA: Diagnosis not present

## 2015-03-31 DIAGNOSIS — Y998 Other external cause status: Secondary | ICD-10-CM | POA: Diagnosis not present

## 2015-03-31 DIAGNOSIS — W1841XA Slipping, tripping and stumbling without falling due to stepping on object, initial encounter: Secondary | ICD-10-CM | POA: Diagnosis not present

## 2015-03-31 DIAGNOSIS — Y9389 Activity, other specified: Secondary | ICD-10-CM | POA: Insufficient documentation

## 2015-03-31 DIAGNOSIS — S99912A Unspecified injury of left ankle, initial encounter: Secondary | ICD-10-CM | POA: Diagnosis present

## 2015-03-31 DIAGNOSIS — R52 Pain, unspecified: Secondary | ICD-10-CM

## 2015-03-31 MED ORDER — HYDROCODONE-ACETAMINOPHEN 5-325 MG PO TABS: ORAL_TABLET | ORAL | Status: DC

## 2015-03-31 NOTE — ED Notes (Signed)
Ortho paged. 

## 2015-03-31 NOTE — Progress Notes (Signed)
Orthopedic Tech Progress Note Patient Details:  Justin [name] 1966/02/12 [id number]  Ortho Devices Type of Ortho Device: Ace wrap, Post (short leg) splint, Crutches Ortho Device/Splint Location: LLE Ortho Device/Splint Interventions: Ordered, Application   Jennye Moccasin 03/31/2015, 8:41 PM

## 2015-03-31 NOTE — ED Provider Notes (Signed)
CSN: [id number]     Arrival date & time 03/31/15  1820 History  By signing my name below, I, Justin [name], attest that this documentation has been prepared under the direction and in the presence of [employer], PA-C. Electronically Signed: Lyndel [name], ED Scribe. 03/31/2015. 8:19 PM.   Chief Complaint  Patient presents with  . Ankle Pain   The history is provided by the patient. No language interpreter was used.   HPI Comments: Justin [name] is a 50 y.o. male, with a h/o left achilles tendon rupture, who presents to the Emergency Department complaining of sudden onset, constant, severe left posterior ankle pain after stepping off a stair approximately 2 hours ago PTA. He has a PShx of left achilles reconstruction performed by Dr. Lajoyce Corners 6 months ago. Pt states when he last injured his achilles tendon he was playing basketball and felt a pop. He reports a similar sensation today with stepping off the stair and he associates a paraesthesias sensation in his left foot. Pt is concerned he re-injured his achilles tendon.   Past Medical History  Diagnosis Date  . Pulmonary embolism (HCC)   . Bipolar 1 disorder (HCC)   . Subdural hematoma (HCC)   . Sleep apnea     wears CPAP  . Achilles rupture, left   . Pneumonia   . Dental caries     periodontitis   Past Surgical History  Procedure Laterality Date  . Appendectomy    . Cystectomy      right head  . Craniotomy N/A 01/19/2013    Procedure: CRANIOTOMY HEMATOMA EVACUATION SUBDURAL;  Surgeon: Hewitt Shorts, MD;  Location: [facility];  Service: Neurosurgery;  Laterality: N/A;  . Elbow surgery      right  . Fracture surgery      finger  . Achilles tendon surgery Left 10/21/2014    Procedure: Left Achilles Reconstruction;  Surgeon: Nadara Mustard, MD;  Location: [facility] OR;  Service: Orthopedics;  Laterality: Left;  Marland Kitchen Multiple extractions with alveoloplasty N/A 03/07/2015    Procedure: MULTIPLE EXTRACTION WITH ALVEOLOPLASTY;   Surgeon: [name], DDS;  Location: [facility] OR;  Service: Oral Surgery;  Laterality: N/A;   Family History  Problem Relation Age of Onset  . Hypertension Mother   . Multiple sclerosis Sister    Social History  Substance Use Topics  . Smoking status: Never Smoker   . Smokeless tobacco: Never Used  . Alcohol Use: No    Review of Systems  A complete 10 system review of systems was obtained and is otherwise negative except at noted in the HPI and PMH.  Allergies  Review of patient's allergies indicates no known allergies.  Home Medications   Prior to Admission medications   Medication Sig Start Date End Date Taking? Authorizing Provider  aspirin EC 325 MG tablet Take 1 tablet (325 mg total) by mouth daily. 10/21/14   Nadara Mustard, MD  ibuprofen (ADVIL,MOTRIN) 600 MG tablet Take 1 tablet (600 mg total) by mouth every 6 (six) hours as needed for mild pain. 03/08/15   [name], DDS  lithium 600 MG capsule Take 600 mg by mouth at bedtime.    Historical Provider, MD  lithium carbonate (ESKALITH) 450 MG CR tablet Take 450 mg by mouth every morning. 02/24/15   Historical Provider, MD  mirtazapine (REMERON) 45 MG tablet Take 45 mg by mouth at bedtime.    Historical Provider, MD  oxyCODONE-acetaminophen (PERCOCET) 10-325 MG tablet Take 1-2 tablets by  mouth every 4 (four) hours as needed for pain. 03/08/15   [name], DDS  QUEtiapine (SEROQUEL) 400 MG tablet Take 800 mg by mouth at bedtime.    Historical Provider, MD   BP 129/101 mmHg  Pulse 105  Temp(Src) 98.5 F (36.9 C) (Oral)  Resp 20  SpO2 96% Physical Exam  Constitutional: He is oriented to person, place, and time. He appears well-developed and well-nourished. No distress.  HENT:  Head: Normocephalic.  Eyes: Conjunctivae are normal.  Neck: Normal range of motion. Neck supple.  Cardiovascular: Normal rate.   Pulmonary/Chest: Effort normal. No respiratory distress.  Musculoskeletal: Normal range of motion.  Left ankle; DP and  PT pulses intact   Thompson  Test negative bilaterally  Neurological: He is alert and oriented to person, place, and time. Coordination normal.  Skin: Skin is warm.  Psychiatric: He has a normal mood and affect. His behavior is normal.  Nursing note and vitals reviewed.   ED Course  Procedures   SPLINT APPLICATION Date/Time: 1:31 AM Authorized by: Wynetta Emery Consent: Verbal consent obtained. Risks and benefits: risks, benefits and alternatives were discussed Consent given by: patient Splint applied by: orthopedic technician Location details: Left ankle  Splint type: Posterior short leg  Supplies used: Ortho-Glass  Post-procedure: The splinted body part was neurovascularly unchanged following the procedure. Patient tolerance: Patient tolerated the procedure well with no immediate complications.    DIAGNOSTIC STUDIES: Oxygen Saturation is 98% on RA, normal by my interpretation.    COORDINATION OF CARE: 8:16 PM Discussed treatment plan with pt. Will order a left ankle splint, crutches and pain medication. Will have pt follow up with Dr. Lajoyce Corners who performed his prior surgery. Pt acknowledges and agrees to plan.   Imaging Review Dg Ankle Complete Left  03/31/2015  CLINICAL DATA:  Status post fall, left ankle pain EXAM: LEFT ANKLE COMPLETE - 3+ VIEW COMPARISON:  None. FINDINGS: There is no evidence of fracture, dislocation, or joint effusion. There is no evidence of arthropathy or other focal bone abnormality. Soft tissues are unremarkable. IMPRESSION: No acute osseous injury of the left ankle. Electronically Signed   By: Elige Ko   On: 03/31/2015 19:33   I have personally reviewed and evaluated these images results as part of my medical decision-making.   MDM   Final diagnoses:  Partial tear of Achilles tendon, right, initial encounter    Filed Vitals:   03/31/15 1826 03/31/15 1941  BP: 129/101 133/84  Pulse: 105 96  Temp: 98.5 F (36.9 C)   TempSrc: Oral    Resp: 20 20  SpO2: 96% 99%    Justin [name] is 50 y.o. male presenting with left posterior ankle pain. Pt is s/p achilles rupture repair in 8/16.  NVI, Thompson test negative. Patient is placed in posterior splint and asked to follow closely with Dr. Lajoyce Corners.   Evaluation does not show pathology that would require ongoing emergent intervention or inpatient treatment. Pt is hemodynamically stable and mentating appropriately. Discussed findings and plan with patient/guardian, who agrees with care plan. All questions answered. Return precautions discussed and outpatient follow up given.   Discharge Medication List as of 03/31/2015  8:21 PM    START taking these medications   Details  HYDROcodone-acetaminophen (NORCO/VICODIN) 5-325 MG tablet Take 1-2 tablets by mouth every 6 hours as needed for pain and/or cough., Print         I personally performed the services described in this documentation, which was scribed in my presence.  The recorded information has been reviewed and is accurate.   Wynetta Emery, PA-C 04/01/15 0132  Courteney Randall An, MD 04/05/15 1000

## 2015-03-31 NOTE — ED Notes (Signed)
Pt states he slipped on some steps today and feels like he pulled his achilles tendon. Pt has tore achilles tendon in the past. Pt able to walk but hurts to extends foot.

## 2015-03-31 NOTE — Discharge Instructions (Signed)
Rest, Ice intermittently (in the first 24-48 hours), Gentle compression with an Ace wrap, and elevate (Limb above the level of the heart)   Take up to 800mg  of ibuprofen (that is usually 4 over the counter pills)  3 times a day for 5 days. Take with food.  Take vicodin for breakthrough pain, do not drink alcohol, drive, care for children or do other critical tasks while taking vicodin.   Achilles Tendon Repair, Care After Refer to this sheet in the next few weeks. These instructions provide you with information on caring for yourself after your procedure. Your health care provider may also give you more specific instructions. Your treatment has been planned according to current medical practices, but problems sometimes occur. Call your health care provider if you have any problems or questions after your procedure. WHAT TO EXPECT AFTER THE PROCEDURE  Your lower leg may feel numb for several hours.  You may feel pain when the numbing medicine wears off.  Most people can start to walk normally in about 6 weeks. It may be 6 months before you can do all your regular activities. Complete recovery can take a year or longer. HOME CARE INSTRUCTIONS  Take pain medicines as directed by your health care provider.  Do not take over-the-counter medicines unless your health care provider says it is okay.  Do not walk on or put weight on your injured leg.  Keep your cast or splint dry while bathing.  Use crutches or another walking aid.  When you are resting, keep your leg above the level of your heart.  Go back to your health care provider about 2 weeks after surgery to have your splint or cast removed. If you have stitches, your health care provider will also take them out during this time.  After your cast or splint is removed, your health care provider will give you instructions on how to move your ankle and how much weight you can put on your leg. Follow your health care provider's  instructions.  See a physical therapist if directed by your health care provider. A physical therapist can help you regain ankle motion and strengthen your leg muscles.  Keep all follow-up appointments. SEEK MEDICAL CARE IF:   You have chills.  You have a fever.  Your pain medicine is not working.  You have persistent numbness or burning. SEEK IMMEDIATE MEDICAL CARE IF:   You have pain or numbness that is getting worse.  You are unable to move your toes.  You are bleeding through your cast or splint.  You notice redness, swelling, bleeding, or discharge near your cut (incision) after your cast or splint has been removed.  You notice warmth, swelling, or tenderness in the back of your lower leg.  You have chest pain.  You have trouble breathing. MAKE SURE YOU:  Understand these instructions.  Will watch your condition.  Will get help right away if you are not doing well or get worse.   This information is not intended to replace advice given to you by your health care provider. Make sure you discuss any questions you have with your health care provider.   Document Released: 11/09/2003 Document Revised: 03/10/2013 Document Reviewed: 01/19/2013 Elsevier Interactive Patient Education [employer]

## 2015-03-31 NOTE — ED Notes (Signed)
Pt stable, ambulatory, states understanding of discharge iinstructions 

## 2015-04-06 ENCOUNTER — Encounter (HOSPITAL_COMMUNITY): Payer: Self-pay

## 2015-04-06 ENCOUNTER — Emergency Department (HOSPITAL_COMMUNITY)
Admission: EM | Admit: 2015-04-06 | Discharge: 2015-04-06 | Disposition: A | Discharge status: Home / Self Care | Payer: Medicaid Other | Attending: Emergency Medicine | Admitting: Emergency Medicine

## 2015-04-06 DIAGNOSIS — Z86711 Personal history of pulmonary embolism: Secondary | ICD-10-CM | POA: Diagnosis not present

## 2015-04-06 DIAGNOSIS — Z8701 Personal history of pneumonia (recurrent): Secondary | ICD-10-CM | POA: Insufficient documentation

## 2015-04-06 DIAGNOSIS — Z7982 Long term (current) use of aspirin: Secondary | ICD-10-CM | POA: Diagnosis not present

## 2015-04-06 DIAGNOSIS — G8929 Other chronic pain: Secondary | ICD-10-CM

## 2015-04-06 DIAGNOSIS — G4730 Sleep apnea, unspecified: Secondary | ICD-10-CM | POA: Insufficient documentation

## 2015-04-06 DIAGNOSIS — R040 Epistaxis: Secondary | ICD-10-CM | POA: Diagnosis not present

## 2015-04-06 DIAGNOSIS — M25572 Pain in left ankle and joints of left foot: Secondary | ICD-10-CM | POA: Insufficient documentation

## 2015-04-06 DIAGNOSIS — R0981 Nasal congestion: Secondary | ICD-10-CM | POA: Diagnosis not present

## 2015-04-06 DIAGNOSIS — J329 Chronic sinusitis, unspecified: Secondary | ICD-10-CM

## 2015-04-06 DIAGNOSIS — J3489 Other specified disorders of nose and nasal sinuses: Secondary | ICD-10-CM

## 2015-04-06 DIAGNOSIS — Z8719 Personal history of other diseases of the digestive system: Secondary | ICD-10-CM | POA: Diagnosis not present

## 2015-04-06 DIAGNOSIS — Z79899 Other long term (current) drug therapy: Secondary | ICD-10-CM | POA: Insufficient documentation

## 2015-04-06 DIAGNOSIS — F319 Bipolar disorder, unspecified: Secondary | ICD-10-CM | POA: Diagnosis not present

## 2015-04-06 DIAGNOSIS — M79672 Pain in left foot: Secondary | ICD-10-CM | POA: Insufficient documentation

## 2015-04-06 DIAGNOSIS — J019 Acute sinusitis, unspecified: Secondary | ICD-10-CM | POA: Insufficient documentation

## 2015-04-06 DIAGNOSIS — Z9981 Dependence on supplemental oxygen: Secondary | ICD-10-CM | POA: Diagnosis not present

## 2015-04-06 DIAGNOSIS — M79662 Pain in left lower leg: Secondary | ICD-10-CM | POA: Insufficient documentation

## 2015-04-06 MED ORDER — FLUTICASONE PROPIONATE 50 MCG/ACT NA SUSP: 2.0000 | Freq: Every day | NASAL | Status: DC

## 2015-04-06 MED ORDER — NAPROXEN 500 MG PO TABS: 500.0000 mg | ORAL_TABLET | Freq: Two times a day (BID) | ORAL | Status: DC | PRN

## 2015-04-06 NOTE — ED Notes (Signed)
Pt requesting to have left foot redressed.  Ace wrap applied by Annye Asa.  EMT

## 2015-04-06 NOTE — ED Provider Notes (Signed)
CSN: [id number]     Arrival date & time 04/06/15  1518 History  By signing my name below, I, Evon Slack, attest that this documentation has been prepared under the direction and in the presence of Ashaunti Treptow Camprubi-Soms, PA-C. Electronically Signed: Evon Slack, ED Scribe. 04/06/2015. 4:01 PM.     Chief Complaint  Patient presents with  . Foot Pain   Patient is a 50 y.o. male presenting with leg pain. The history is provided by the patient. No language interpreter was used.  Leg Pain Location:  Leg Injury: no   Leg location:  L lower leg Pain details:    Quality:  Sharp   Radiates to: L knee.   Severity:  Moderate   Onset quality:  Gradual   Timing:  Constant   Progression:  Unchanged Chronicity:  Chronic Prior injury to area:  Yes Relieved by:  NSAIDs Worsened by:  Activity Ineffective treatments:  None tried Associated symptoms: no decreased ROM, no fever, no muscle weakness, no numbness, no swelling and no tingling    HPI Comments: JANOS SHAMPINE is a 50 y.o. male with a PMHx of L achilles tendon rupture s/p surgical reconstruction, who presents to the Emergency Department complaining of chronic constant sharp left ankle/calf pain onset 3 months prior. Pt rates the pain 7/10. Pt states that the pain is radiating up from the ankle to the knee. Pt states that the pain is worse after ambulating. Pt states that he has tried ibuprofen with slight relief. He has a PShx of left achilles reconstruction performed by Dr. Lajoyce Corners 6 months ago. He states that he has an orthopedic appointment on April 21, 2014.   Pt is also complaining of left sided nasal congestion. Pt states that when he blows his nose there is some blood present. Pt denies epistaxis. Pt doesn't report injury or trauma to the nose. He denies fever, chills, ear pain/drainage, post nasal drainage, sore throat, CP,SOB, abdominal pain, n/v/d/c, dysuria, hematuria, numbness, tingling, or focal weakness. Denies loss of ROM to  ankle. No swelling or skin changes. States he has not had any new injuries since the re-injury last week when he was seen here.   Past Medical History  Diagnosis Date  . Pulmonary embolism (HCC)   . Bipolar 1 disorder (HCC)   . Subdural hematoma (HCC)   . Sleep apnea     wears CPAP  . Achilles rupture, left   . Pneumonia   . Dental caries     periodontitis   Past Surgical History  Procedure Laterality Date  . Appendectomy    . Cystectomy      right head  . Craniotomy N/A 01/19/2013    Procedure: CRANIOTOMY HEMATOMA EVACUATION SUBDURAL;  Surgeon: Hewitt Shorts, MD;  Location: [facility];  Service: Neurosurgery;  Laterality: N/A;  . Elbow surgery      right  . Fracture surgery      finger  . Achilles tendon surgery Left 10/21/2014    Procedure: Left Achilles Reconstruction;  Surgeon: Nadara Mustard, MD;  Location: [facility] OR;  Service: Orthopedics;  Laterality: Left;  Marland Kitchen Multiple extractions with alveoloplasty N/A 03/07/2015    Procedure: MULTIPLE EXTRACTION WITH ALVEOLOPLASTY;  Surgeon: [name], DDS;  Location: [facility] OR;  Service: Oral Surgery;  Laterality: N/A;   Family History  Problem Relation Age of Onset  . Hypertension Mother   . Multiple sclerosis Sister    Social History  Substance Use Topics  . Smoking status: Never Smoker   .  Smokeless tobacco: Never Used  . Alcohol Use: No    Review of Systems  Constitutional: Negative for fever and chills.  HENT: Positive for congestion and nosebleeds (intermittent with nose blowing, very sparse). Negative for ear discharge, ear pain, postnasal drip and sore throat.   Respiratory: Negative for cough and shortness of breath.   Cardiovascular: Negative for chest pain.  Gastrointestinal: Negative for nausea, vomiting, abdominal pain, diarrhea and constipation.  Genitourinary: Negative for dysuria and hematuria.  Musculoskeletal: Positive for myalgias and arthralgias (L ankle/leg). Negative for joint swelling.  Skin: Negative  for color change.  Allergic/Immunologic: Negative for immunocompromised state.  Neurological: Negative for weakness and numbness.  Psychiatric/Behavioral: Negative for confusion.  10 Systems reviewed and are negative for acute change except as noted in the HPI.     Allergies  Review of patient's allergies indicates no known allergies.  Home Medications   Prior to Admission medications   Medication Sig Start Date End Date Taking? Authorizing Provider  aspirin EC 325 MG tablet Take 1 tablet (325 mg total) by mouth daily. 10/21/14   Nadara Mustard, MD  HYDROcodone-acetaminophen (NORCO/VICODIN) 5-325 MG tablet Take 1-2 tablets by mouth every 6 hours as needed for pain and/or cough. 03/31/15   Nicole Pisciotta, PA-C  ibuprofen (ADVIL,MOTRIN) 600 MG tablet Take 1 tablet (600 mg total) by mouth every 6 (six) hours as needed for mild pain. 03/08/15   [name], DDS  lithium 600 MG capsule Take 600 mg by mouth at bedtime.    Historical Provider, MD  lithium carbonate (ESKALITH) 450 MG CR tablet Take 450 mg by mouth every morning. 02/24/15   Historical Provider, MD  mirtazapine (REMERON) 45 MG tablet Take 45 mg by mouth at bedtime.    Historical Provider, MD  oxyCODONE-acetaminophen (PERCOCET) 10-325 MG tablet Take 1-2 tablets by mouth every 4 (four) hours as needed for pain. 03/08/15   [name], DDS  QUEtiapine (SEROQUEL) 400 MG tablet Take 800 mg by mouth at bedtime.    Historical Provider, MD   BP 143/109 mmHg  Pulse 97  Temp(Src) 98.4 F (36.9 C) (Oral)  Resp 16  Ht 5\' 9"  (1.753 m)  Wt 240 lb (108.863 kg)  BMI 35.43 kg/m2  SpO2 95%   Physical Exam  Constitutional: He is oriented to person, place, and time. Vital signs are normal. He appears well-developed and well-nourished.  Non-toxic appearance. No distress.  Afebrile, nontoxic, NAD  HENT:  Head: Normocephalic and atraumatic.  Nose: Mucosal edema and rhinorrhea present. No epistaxis.  Mouth/Throat: Mucous membranes are normal.   Right nare with mild mucosal edema and rhinorrhea no ongoing epistaxis.   Eyes: Conjunctivae and EOM are normal. Right eye exhibits no discharge. Left eye exhibits no discharge.  Neck: Normal range of motion. Neck supple.  Cardiovascular: Normal rate and intact distal pulses.   Pulmonary/Chest: Effort normal. No respiratory distress.  Abdominal: Normal appearance. He exhibits no distension.  Musculoskeletal:       Left ankle: He exhibits decreased range of motion (due to pain). He exhibits no swelling, no ecchymosis and normal pulse. Tenderness. Achilles tendon normal.  Left ankle in posterior splint and cam walker, with decreased ROM due to pain, mild diffuse tenderness in the ankle and calf, no swelling or deformities, sensation grossly intact, distal pulses intact, wiggles toes with ease. Achilles intact.  Neurological: He is alert and oriented to person, place, and time. He has normal strength. No sensory deficit.  Skin: Skin is warm, dry and intact.  No rash noted.  Psychiatric: He has a normal mood and affect.  Nursing note and vitals reviewed.   ED Course  Procedures (including critical care time) DIAGNOSTIC STUDIES: Oxygen Saturation is 95% on RA, adequate by my interpretation.    COORDINATION OF CARE: 4:12 PM-Discussed treatment plan with pt at bedside and pt agreed to plan.     Labs Review Labs Reviewed - No data to display  Imaging Review  Dg Ankle Complete Left  03/31/2015 CLINICAL DATA: Status post fall, left ankle pain EXAM: LEFT ANKLE COMPLETE - 3+ VIEW COMPARISON: None. FINDINGS: There is no evidence of fracture, dislocation, or joint effusion. There is no evidence of arthropathy or other focal bone abnormality. Soft tissues are unremarkable. IMPRESSION: No acute osseous injury of the left ankle. Electronically Signed By: Elige Ko On: 03/31/2015 19:33    EKG Interpretation None      MDM   Final diagnoses:  Chronic foot pain, left  Chronic ankle  pain, left  Rhinorrhea  Sinusitis, unspecified chronicity, unspecified location     50 y.o. male here with acute on chronic L lower leg/foot/ankle pain. No new injury. Pt has cam walker and splint that was applied 5 days ago. Had xrays done that day which were neg. Discussed that he needs to f/up with ortho for ongoing management of his chronic pain. Doubt need for further imaging. Leg is NVI with soft compartments. Discussed ice/heat, tylenol/naprosyn use.   Also here with rhinorrhea/sinus congestion x1wk. No epistaxis noted. Discussed using saline flush/netipot and flonase. Doubt need for abx. F/up with PCP in 1wk. I explained the diagnosis and have given explicit precautions to return to the ER including for any other new or worsening symptoms. The patient understands and accepts the medical plan as it's been dictated and I have answered their questions. Discharge instructions concerning home care and prescriptions have been given. The patient is STABLE and is discharged to home in good condition.   I personally performed the services described in this documentation, which was scribed in my presence. The recorded information has been reviewed and is accurate.  BP 143/109 mmHg  Pulse 97  Temp(Src) 98.4 F (36.9 C) (Oral)  Resp 16  Ht 5\' 9"  (1.753 m)  Wt 108.863 kg  BMI 35.43 kg/m2  SpO2 95%  Meds ordered this encounter  Medications  . naproxen (NAPROSYN) 500 MG tablet    Sig: Take 1 tablet (500 mg total) by mouth 2 (two) times daily as needed for mild pain, moderate pain or headache (TAKE WITH MEALS.).    Dispense:  20 tablet    Refill:  0    Order Specific Question:  Supervising Provider    Answer:  MILLER, BRIAN [[id number]]  . fluticasone (FLONASE) 50 MCG/ACT nasal spray    Sig: Place 2 sprays into both nostrils daily.    Dispense:  16 g    Refill:  0    Order Specific Question:  Supervising Provider    Answer:  Eber Hong [[id number]]        Heith Haigler Camprubi-Soms,  PA-C 04/06/15 1628  Doug Sou, MD 04/06/15 2342

## 2015-04-06 NOTE — ED Notes (Signed)
Pt st's he has continued to have pain in his left foot and lower leg.  Pt is currently wearing a ortho boot on left.  St's he has appt. With ortho appt on Feb. 3 but thinks he will be out of town at that time and needs more medication

## 2015-04-06 NOTE — Discharge Instructions (Signed)
Continue to stay well-hydrated. Continue to alternate between Tylenol and naprosyn for pain or fever. Use netipot or saline flushes and flonase to help with nasal congestion. May consider over-the-counter Benadryl or other antihistamine to decrease secretions and for watery itchy eyes. Followup with your primary care doctor in 5-7 days for recheck of ongoing symptoms. Return to emergency department for emergent changing or worsening of symptoms.  Use your cam walker and splint as directed. Use ice and heat as needed to help with pain. Alternate between naprosyn and tylenol as needed for pain. Follow up with the orthopedist in 1-2 weeks for recheck and ongoing management of pain. Return to the ER for changes or worsening symptoms.   Musculoskeletal Pain Musculoskeletal pain is muscle and boney aches and pains. These pains can occur in any part of the body. Your caregiver may treat you without knowing the cause of the pain. They may treat you if blood or urine tests, X-rays, and other tests were normal.  CAUSES There is often not a definite cause or reason for these pains. These pains may be caused by a type of germ (virus). The discomfort may also come from overuse. Overuse includes working out too hard when your body is not fit. Boney aches also come from weather changes. Bone is sensitive to atmospheric pressure changes. HOME CARE INSTRUCTIONS   Ask when your test results will be ready. Make sure you get your test results.  Only take over-the-counter or prescription medicines for pain, discomfort, or fever as directed by your caregiver. If you were given medications for your condition, do not drive, operate machinery or power tools, or sign legal documents for 24 hours. Do not drink alcohol. Do not take sleeping pills or other medications that may interfere with treatment.  Continue all activities unless the activities cause more pain. When the pain lessens, slowly resume normal activities. Gradually  increase the intensity and duration of the activities or exercise.  During periods of severe pain, bed rest may be helpful. Lay or sit in any position that is comfortable.  Putting ice on the injured area.  Put ice in a bag.  Place a towel between your skin and the bag.  Leave the ice on for 15 to 20 minutes, 3 to 4 times a day.  Follow up with your caregiver for continued problems and no reason can be found for the pain. If the pain becomes worse or does not go away, it may be necessary to repeat tests or do additional testing. Your caregiver may need to look further for a possible cause. SEEK IMMEDIATE MEDICAL CARE IF:  You have pain that is getting worse and is not relieved by medications.  You develop chest pain that is associated with shortness or breath, sweating, feeling sick to your stomach (nauseous), or throw up (vomit).  Your pain becomes localized to the abdomen.  You develop any new symptoms that seem different or that concern you. MAKE SURE YOU:   Understand these instructions.  Will watch your condition.  Will get help right away if you are not doing well or get worse.   This information is not intended to replace advice given to you by your health care provider. Make sure you discuss any questions you have with your health care provider.   Document Released: 03/05/2005 Document Revised: 05/28/2011 Document Reviewed: 11/07/2012 Elsevier Interactive Patient Education 2016 Elsevier Inc.  [employer] Therapy Heat therapy can help ease sore, stiff, injured, and tight muscles and joints. Heat  relaxes your muscles, which may help ease your pain. Heat therapy should only be used on old, pre-existing, or long-lasting (chronic) injuries. Do not use heat therapy unless told by your doctor. HOW TO USE HEAT THERAPY There are several different kinds of heat therapy, including:  Moist heat pack.  Warm water bath.  Hot water bottle.  Electric heating pad.  Heated gel  pack.  Heated wrap.  Electric heating pad. GENERAL HEAT THERAPY RECOMMENDATIONS   Do not sleep while using heat therapy. Only use heat therapy while you are awake.  Your skin may turn pink while using heat therapy. Do not use heat therapy if your skin turns red.  Do not use heat therapy if you have new pain.  High heat or long exposure to heat can cause burns. Be careful when using heat therapy to avoid burning your skin.  Do not use heat therapy on areas of your skin that are already irritated, such as with a rash or sunburn. GET HELP IF:   You have blisters, redness, swelling (puffiness), or numbness.  You have new pain.  Your pain is worse. MAKE SURE YOU:  Understand these instructions.  Will watch your condition.  Will get help right away if you are not doing well or get worse.   This information is not intended to replace advice given to you by your health care provider. Make sure you discuss any questions you have with your health care provider.   Document Released: 05/28/2011 Document Revised: 03/26/2014 Document Reviewed: 04/28/2013 Elsevier Interactive Patient Education 2016 Elsevier Inc.   Sinusitis, Adult Sinusitis is redness, soreness, and inflammation of the paranasal sinuses. Paranasal sinuses are air pockets within the bones of your face. They are located beneath your eyes, in the middle of your forehead, and above your eyes. In healthy paranasal sinuses, mucus is able to drain out, and air is able to circulate through them by way of your nose. However, when your paranasal sinuses are inflamed, mucus and air can become trapped. This can allow bacteria and other germs to grow and cause infection. Sinusitis can develop quickly and last only a short time (acute) or continue over a long period (chronic). Sinusitis that lasts for more than 12 weeks is considered chronic. CAUSES Causes of sinusitis include:  Allergies.  Structural abnormalities, such as  displacement of the cartilage that separates your nostrils (deviated septum), which can decrease the air flow through your nose and sinuses and affect sinus drainage.  Functional abnormalities, such as when the small hairs (cilia) that line your sinuses and help remove mucus do not work properly or are not present. SIGNS AND SYMPTOMS Symptoms of acute and chronic sinusitis are the same. The primary symptoms are pain and pressure around the affected sinuses. Other symptoms include:  Upper toothache.  Earache.  Headache.  Bad breath.  Decreased sense of smell and taste.  A cough, which worsens when you are lying flat.  Fatigue.  Fever.  Thick drainage from your nose, which often is [place] and may contain pus (purulent).  Swelling and warmth over the affected sinuses. DIAGNOSIS Your health care provider will perform a physical exam. During your exam, your health care provider may perform any of the following to help determine if you have acute sinusitis or chronic sinusitis:  Look in your nose for signs of abnormal growths in your nostrils (nasal polyps).  Tap over the affected sinus to check for signs of infection.  View the inside of your sinuses using  an imaging device that has a light attached (endoscope). If your health care provider suspects that you have chronic sinusitis, one or more of the following tests may be recommended:  Allergy tests.  Nasal culture. A sample of mucus is taken from your nose, sent to a lab, and screened for bacteria.  Nasal cytology. A sample of mucus is taken from your nose and examined by your health care provider to determine if your sinusitis is related to an allergy. TREATMENT Most cases of acute sinusitis are related to a viral infection and will resolve on their own within 10 days. Sometimes, medicines are prescribed to help relieve symptoms of both acute and chronic sinusitis. These may include pain medicines, decongestants, nasal steroid  sprays, or saline sprays. However, for sinusitis related to a bacterial infection, your health care provider will prescribe antibiotic medicines. These are medicines that will help kill the bacteria causing the infection. Rarely, sinusitis is caused by a fungal infection. In these cases, your health care provider will prescribe antifungal medicine. For some cases of chronic sinusitis, surgery is needed. Generally, these are cases in which sinusitis recurs more than 3 times per year, despite other treatments. HOME CARE INSTRUCTIONS  Drink plenty of water. Water helps thin the mucus so your sinuses can drain more easily.  Use a humidifier.  Inhale steam 3-4 times a day (for example, sit in the bathroom with the shower running).  Apply a warm, moist washcloth to your face 3-4 times a day, or as directed by your health care provider.  Use saline nasal sprays to help moisten and clean your sinuses.  Take medicines only as directed by your health care provider.  If you were prescribed either an antibiotic or antifungal medicine, finish it all even if you start to feel better. SEEK IMMEDIATE MEDICAL CARE IF:  You have increasing pain or severe headaches.  You have nausea, vomiting, or drowsiness.  You have swelling around your face.  You have vision problems.  You have a stiff neck.  You have difficulty breathing.   This information is not intended to replace advice given to you by your health care provider. Make sure you discuss any questions you have with your health care provider.   Document Released: 03/05/2005 Document Revised: 03/26/2014 Document Reviewed: 03/20/2011 Elsevier Interactive Patient Education [employer]

## 2015-04-06 NOTE — ED Notes (Signed)
Pt here for a recheck and re bandage of his left foot. He has surgery on his achilles tendon about 2-3 months ago. He saw a doctor a few days ago and was told it was "tore" again and is unable to get appt with ortho until Feb.

## 2015-05-20 ENCOUNTER — Emergency Department (HOSPITAL_COMMUNITY)
Admission: EM | Admit: 2015-05-20 | Discharge: 2015-05-20 | Disposition: A | Discharge status: Home / Self Care | Payer: Medicaid Other | Attending: Emergency Medicine | Admitting: Emergency Medicine

## 2015-05-20 ENCOUNTER — Encounter (HOSPITAL_COMMUNITY): Payer: Self-pay | Admitting: Emergency Medicine

## 2015-05-20 DIAGNOSIS — M722 Plantar fascial fibromatosis: Secondary | ICD-10-CM | POA: Diagnosis not present

## 2015-05-20 DIAGNOSIS — Z8701 Personal history of pneumonia (recurrent): Secondary | ICD-10-CM | POA: Insufficient documentation

## 2015-05-20 DIAGNOSIS — Z8679 Personal history of other diseases of the circulatory system: Secondary | ICD-10-CM | POA: Diagnosis not present

## 2015-05-20 DIAGNOSIS — Z79899 Other long term (current) drug therapy: Secondary | ICD-10-CM | POA: Insufficient documentation

## 2015-05-20 DIAGNOSIS — Z7951 Long term (current) use of inhaled steroids: Secondary | ICD-10-CM | POA: Diagnosis not present

## 2015-05-20 DIAGNOSIS — G4730 Sleep apnea, unspecified: Secondary | ICD-10-CM | POA: Insufficient documentation

## 2015-05-20 DIAGNOSIS — F319 Bipolar disorder, unspecified: Secondary | ICD-10-CM | POA: Insufficient documentation

## 2015-05-20 DIAGNOSIS — Z86711 Personal history of pulmonary embolism: Secondary | ICD-10-CM | POA: Insufficient documentation

## 2015-05-20 DIAGNOSIS — Z8719 Personal history of other diseases of the digestive system: Secondary | ICD-10-CM | POA: Insufficient documentation

## 2015-05-20 DIAGNOSIS — Z7982 Long term (current) use of aspirin: Secondary | ICD-10-CM | POA: Insufficient documentation

## 2015-05-20 DIAGNOSIS — Z87828 Personal history of other (healed) physical injury and trauma: Secondary | ICD-10-CM | POA: Insufficient documentation

## 2015-05-20 DIAGNOSIS — M79671 Pain in right foot: Secondary | ICD-10-CM | POA: Diagnosis present

## 2015-05-20 NOTE — ED Notes (Signed)
Pt sts right foot pain on the bottom of foot x 3 days; pt denies obvious injury

## 2015-05-20 NOTE — Discharge Instructions (Signed)
Plantar Fasciitis Plantar fasciitis is a painful foot condition that affects the heel. It occurs when the band of tissue that connects the toes to the heel bone (plantar fascia) becomes irritated. This can happen after exercising too much or doing other repetitive activities (overuse injury). The pain from plantar fasciitis can range from mild irritation to severe pain that makes it difficult for you to walk or move. The pain is usually worse in the morning or after you have been sitting or lying down for a while. CAUSES This condition may be caused by:  Standing for long periods of time.  Wearing shoes that do not fit.  Doing high-impact activities, including running, aerobics, and ballet.  Being overweight.  Having an abnormal way of walking (gait).  Having tight calf muscles.  Having high arches in your feet.  Starting a new athletic activity. SYMPTOMS The main symptom of this condition is heel pain. Other symptoms include:  Pain that gets worse after activity or exercise.  Pain that is worse in the morning or after resting.  Pain that goes away after you walk for a few minutes. DIAGNOSIS This condition may be diagnosed based on your signs and symptoms. Your health care provider will also do a physical exam to check for:  A tender area on the bottom of your foot.  A high arch in your foot.  Pain when you move your foot.  Difficulty moving your foot. You may also need to have imaging studies to confirm the diagnosis. These can include:  X-rays.  Ultrasound.  MRI. TREATMENT  Treatment for plantar fasciitis depends on the severity of the condition. Your treatment may include:  Rest, ice, and over-the-counter pain medicines to manage your pain.  Exercises to stretch your calves and your plantar fascia.  A splint that holds your foot in a stretched, upward position while you sleep (night splint).  Physical therapy to relieve symptoms and prevent problems in the  future.  Cortisone injections to relieve severe pain.  Extracorporeal shock wave therapy (ESWT) to stimulate damaged plantar fascia with electrical impulses. It is often used as a last resort before surgery.  Surgery, if other treatments have not worked after 12 months. HOME CARE INSTRUCTIONS  Take medicines only as directed by your health care provider.  Avoid activities that cause pain.  Roll the bottom of your foot over a bag of ice or a bottle of cold water. Do this for 20 minutes, 3-4 times a day.  Perform simple stretches as directed by your health care provider.  Try wearing athletic shoes with air-sole or gel-sole cushions or soft shoe inserts.  Wear a night splint while sleeping, if directed by your health care provider.  Keep all follow-up appointments with your health care provider. PREVENTION   Do not perform exercises or activities that cause heel pain.  Consider finding low-impact activities if you continue to have problems.  Lose weight if you need to. The best way to prevent plantar fasciitis is to avoid the activities that aggravate your plantar fascia. SEEK MEDICAL CARE IF:  Your symptoms do not go away after treatment with home care measures.  Your pain gets worse.  Your pain affects your ability to move or do your daily activities.   This information is not intended to replace advice given to you by your health care provider. Make sure you discuss any questions you have with your health care provider.   Document Released: 11/28/2000 Document Revised: 11/24/2014 Document Reviewed: 01/13/2014 Elsevier   Interactive Patient Education 2016 Elsevier Inc.  

## 2015-05-20 NOTE — ED Notes (Signed)
C/o RIGHT foot pain, plantar surface, x 3 days. No known injury. Just came from Ortho office for recheck of LEFT foot for which he had sx in December.

## 2015-05-20 NOTE — ED Provider Notes (Signed)
CSN: [id number]     Arrival date & time 05/20/15  1009 History  By signing my name below, I, Justin [name], attest that this documentation has been prepared under the direction and in the presence of [employer], PA-C.  Electronically Signed: Lyndel [name], ED Scribe. 05/20/2015. 11:45 AM.  Chief Complaint  Patient presents with  . Foot Pain   The history is provided by the patient. No language interpreter was used.   HPI Comments: Justin [name] is a 50 y.o. male who presents to the Emergency Department complaining of constant, severe sharp pain to plantar surface of right medial foot, that is worse with first steps in the morning, X 3 days. He denies injury or trauma to the foot. He has not taken any alleviating medication or tried any alleviating treatments for pain.The pt was evaluated by Dr. Lajoyce Corners this morning just prior to this ED visit for follow up appointment s/p left achilles rupture. He states he did not mention this complaint to Dr. Lajoyce Corners as he was under the impression Dr. Lajoyce Corners does not deal with complaints like this. Pt denies any injuries, trauma, or pain to right ankle, leg or knee.   Past Medical History  Diagnosis Date  . Pulmonary embolism (HCC)   . Bipolar 1 disorder (HCC)   . Subdural hematoma (HCC)   . Sleep apnea     wears CPAP  . Achilles rupture, left   . Pneumonia   . Dental caries     periodontitis   Past Surgical History  Procedure Laterality Date  . Appendectomy    . Cystectomy      right head  . Craniotomy N/A 01/19/2013    Procedure: CRANIOTOMY HEMATOMA EVACUATION SUBDURAL;  Surgeon: Hewitt Shorts, MD;  Location: [facility];  Service: Neurosurgery;  Laterality: N/A;  . Elbow surgery      right  . Fracture surgery      finger  . Achilles tendon surgery Left 10/21/2014    Procedure: Left Achilles Reconstruction;  Surgeon: Nadara Mustard, MD;  Location: [facility] OR;  Service: Orthopedics;  Laterality: Left;  Marland Kitchen Multiple extractions with alveoloplasty N/A  03/07/2015    Procedure: MULTIPLE EXTRACTION WITH ALVEOLOPLASTY;  Surgeon: [name], DDS;  Location: [facility] OR;  Service: Oral Surgery;  Laterality: N/A;   Family History  Problem Relation Age of Onset  . Hypertension Mother   . Multiple sclerosis Sister    Social History  Substance Use Topics  . Smoking status: Never Smoker   . Smokeless tobacco: Never Used  . Alcohol Use: No    Review of Systems  Musculoskeletal: Positive for myalgias ( plantar surface of right foot). Negative for gait problem.  All other systems reviewed and are negative.  Allergies  Review of patient's allergies indicates no known allergies.  Home Medications   Prior to Admission medications   Medication Sig Start Date End Date Taking? Authorizing Provider  aspirin EC 325 MG tablet Take 1 tablet (325 mg total) by mouth daily. 10/21/14   Nadara Mustard, MD  fluticasone (FLONASE) 50 MCG/ACT nasal spray Place 2 sprays into both nostrils daily. 04/06/15   [name], PA-C  HYDROcodone-acetaminophen (NORCO/VICODIN) 5-325 MG tablet Take 1-2 tablets by mouth every 6 hours as needed for pain and/or cough. 03/31/15   Nicole Pisciotta, PA-C  ibuprofen (ADVIL,MOTRIN) 600 MG tablet Take 1 tablet (600 mg total) by mouth every 6 (six) hours as needed for mild pain. 03/08/15   [name], DDS  lithium 600 MG capsule Take 600 mg by mouth at bedtime.    Historical Provider, MD  lithium carbonate (ESKALITH) 450 MG CR tablet Take 450 mg by mouth every morning. 02/24/15   Historical Provider, MD  mirtazapine (REMERON) 45 MG tablet Take 45 mg by mouth at bedtime.    Historical Provider, MD  naproxen (NAPROSYN) 500 MG tablet Take 1 tablet (500 mg total) by mouth 2 (two) times daily as needed for mild pain, moderate pain or headache (TAKE WITH MEALS.). 04/06/15   [name], PA-C  oxyCODONE-acetaminophen (PERCOCET) 10-325 MG tablet Take 1-2 tablets by mouth every 4 (four) hours as needed for pain. 03/08/15   [name], DDS  QUEtiapine (SEROQUEL) 400 MG tablet Take 800 mg by mouth at bedtime.    Historical Provider, MD   BP 124/90 mmHg  Pulse 94  Temp(Src) 98.1 F (36.7 C) (Oral)  Resp 22  SpO2 97% Physical Exam  Constitutional: He is oriented to person, place, and time. He appears well-developed and well-nourished. No distress.  HENT:  Head: Normocephalic.  Eyes: Conjunctivae are normal.  Neck: Normal range of motion. Neck supple.  Cardiovascular: Normal rate.   Pulmonary/Chest: Effort normal. No respiratory distress.  Musculoskeletal: Normal range of motion. He exhibits tenderness. He exhibits no edema.  Tenderness to plantar aspect of right foot. No swelling or edema.   Neurological: He is alert and oriented to person, place, and time. Coordination normal.  Skin: Skin is warm.  Psychiatric: He has a normal mood and affect. His behavior is normal.  Nursing note and vitals reviewed.   ED Course  Procedures  DIAGNOSTIC STUDIES: Oxygen Saturation is 97% on RA, normal by my interpretation.    COORDINATION OF CARE: 11:43 AM Discussed treatment plan with pt at bedside and pt agreed to plan.   MDM   Final diagnoses:  Plantar fasciitis   Labs:   Imaging:  Consults:  Therapeutics:  Discharge Meds:   Assessment/Plan: suspect plantar fasciitis. No signs of infection. Advised pt to ice plantar aspect of right foot.  He has follow up with orthopedist Dr. Lajoyce Corners.    I personally performed the services described in this documentation, which was scribed in my presence. The recorded information has been reviewed and is accurate.    [name], PA-C 05/20/15 1329  Tilden Fossa, MD 05/21/15 [phone]

## 2015-08-07 ENCOUNTER — Encounter (HOSPITAL_COMMUNITY): Payer: Self-pay

## 2015-08-07 ENCOUNTER — Emergency Department (HOSPITAL_COMMUNITY)
Admission: EM | Admit: 2015-08-07 | Discharge: 2015-08-07 | Disposition: A | Discharge status: Home / Self Care | Payer: Medicaid Other | Attending: Emergency Medicine | Admitting: Emergency Medicine

## 2015-08-07 DIAGNOSIS — I10 Essential (primary) hypertension: Secondary | ICD-10-CM | POA: Diagnosis not present

## 2015-08-07 DIAGNOSIS — S61202A Unspecified open wound of right middle finger without damage to nail, initial encounter: Secondary | ICD-10-CM | POA: Diagnosis not present

## 2015-08-07 DIAGNOSIS — Y998 Other external cause status: Secondary | ICD-10-CM | POA: Diagnosis not present

## 2015-08-07 DIAGNOSIS — Z79899 Other long term (current) drug therapy: Secondary | ICD-10-CM | POA: Diagnosis not present

## 2015-08-07 DIAGNOSIS — S6991XA Unspecified injury of right wrist, hand and finger(s), initial encounter: Secondary | ICD-10-CM | POA: Diagnosis present

## 2015-08-07 DIAGNOSIS — G4730 Sleep apnea, unspecified: Secondary | ICD-10-CM | POA: Insufficient documentation

## 2015-08-07 DIAGNOSIS — Z9889 Other specified postprocedural states: Secondary | ICD-10-CM | POA: Insufficient documentation

## 2015-08-07 DIAGNOSIS — Y9389 Activity, other specified: Secondary | ICD-10-CM | POA: Diagnosis not present

## 2015-08-07 DIAGNOSIS — S61210A Laceration without foreign body of right index finger without damage to nail, initial encounter: Secondary | ICD-10-CM | POA: Insufficient documentation

## 2015-08-07 DIAGNOSIS — W270XXA Contact with workbench tool, initial encounter: Secondary | ICD-10-CM | POA: Insufficient documentation

## 2015-08-07 DIAGNOSIS — Z9981 Dependence on supplemental oxygen: Secondary | ICD-10-CM | POA: Insufficient documentation

## 2015-08-07 DIAGNOSIS — Z8719 Personal history of other diseases of the digestive system: Secondary | ICD-10-CM | POA: Insufficient documentation

## 2015-08-07 DIAGNOSIS — Z8701 Personal history of pneumonia (recurrent): Secondary | ICD-10-CM | POA: Diagnosis not present

## 2015-08-07 DIAGNOSIS — F319 Bipolar disorder, unspecified: Secondary | ICD-10-CM | POA: Insufficient documentation

## 2015-08-07 DIAGNOSIS — Z791 Long term (current) use of non-steroidal anti-inflammatories (NSAID): Secondary | ICD-10-CM | POA: Insufficient documentation

## 2015-08-07 DIAGNOSIS — Z23 Encounter for immunization: Secondary | ICD-10-CM | POA: Insufficient documentation

## 2015-08-07 DIAGNOSIS — Y9289 Other specified places as the place of occurrence of the external cause: Secondary | ICD-10-CM | POA: Diagnosis not present

## 2015-08-07 DIAGNOSIS — Z86711 Personal history of pulmonary embolism: Secondary | ICD-10-CM | POA: Insufficient documentation

## 2015-08-07 DIAGNOSIS — IMO0002 Reserved for concepts with insufficient information to code with codable children: Secondary | ICD-10-CM

## 2015-08-07 MED ORDER — TETANUS-DIPHTH-ACELL PERTUSSIS 5-2.5-18.5 LF-MCG/0.5 IM SUSP
0.5000 mL | Freq: Once | INTRAMUSCULAR | Status: AC
  Administered 2015-08-07: 0.5 mL via INTRAMUSCULAR
  Filled 2015-08-07: qty 0.5

## 2015-08-07 MED ORDER — ACETAMINOPHEN 325 MG PO TABS
650.0000 mg | ORAL_TABLET | Freq: Once | ORAL | Status: AC
  Administered 2015-08-07: 650 mg via ORAL
  Filled 2015-08-07: qty 2

## 2015-08-07 NOTE — Discharge Instructions (Signed)
Hypertension Hypertension, commonly called high blood pressure, is when the force of blood pumping through your arteries is too strong. Your arteries are the blood vessels that carry blood from your heart throughout your body. A blood pressure reading consists of a higher number over a lower number, such as 110/72. The higher number (systolic) is the pressure inside your arteries when your heart pumps. The lower number (diastolic) is the pressure inside your arteries when your heart relaxes. Ideally you want your blood pressure below 120/80. Hypertension forces your heart to work harder to pump blood. Your arteries may become narrow or stiff. Having untreated or uncontrolled hypertension can cause heart attack, stroke, kidney disease, and other problems. RISK FACTORS Some risk factors for high blood pressure are controllable. Others are not.  Risk factors you cannot control include:   Race. You may be at higher risk if you are African American.  Age. Risk increases with age.  Gender. Men are at higher risk than women before age 45 years. After age 65, women are at higher risk than men. Risk factors you can control include:  Not getting enough exercise or physical activity.  Being overweight.  Getting too much fat, sugar, calories, or salt in your diet.  Drinking too much alcohol. SIGNS AND SYMPTOMS Hypertension does not usually cause signs or symptoms. Extremely high blood pressure (hypertensive crisis) may cause headache, anxiety, shortness of breath, and nosebleed. DIAGNOSIS To check if you have hypertension, your health care provider will measure your blood pressure while you are seated, with your arm held at the level of your heart. It should be measured at least twice using the same arm. Certain conditions can cause a difference in blood pressure between your right and left arms. A blood pressure reading that is higher than normal on one occasion does not mean that you need treatment. If  it is not clear whether you have high blood pressure, you may be asked to return on a different day to have your blood pressure checked again. Or, you may be asked to monitor your blood pressure at home for 1 or more weeks. TREATMENT Treating high blood pressure includes making lifestyle changes and possibly taking medicine. Living a healthy lifestyle can help lower high blood pressure. You may need to change some of your habits. Lifestyle changes may include:  Following the DASH diet. This diet is high in fruits, vegetables, and whole grains. It is low in salt, red meat, and added sugars.  Keep your sodium intake below 2,300 mg per day.  Getting at least 30-45 minutes of aerobic exercise at least 4 times per week.  Losing weight if necessary.  Not smoking.  Limiting alcoholic beverages.  Learning ways to reduce stress. Your health care provider may prescribe medicine if lifestyle changes are not enough to get your blood pressure under control, and if one of the following is true:  You are 18-59 years of age and your systolic blood pressure is above 140.  You are 60 years of age or older, and your systolic blood pressure is above 150.  Your diastolic blood pressure is above 90.  You have diabetes, and your systolic blood pressure is over 140 or your diastolic blood pressure is over 90.  You have kidney disease and your blood pressure is above 140/90.  You have heart disease and your blood pressure is above 140/90. Your personal target blood pressure may vary depending on your medical conditions, your age, and other factors. HOME CARE INSTRUCTIONS    Have your blood pressure rechecked as directed by your health care provider.   Take medicines only as directed by your health care provider. Follow the directions carefully. Blood pressure medicines must be taken as prescribed. The medicine does not work as well when you skip doses. Skipping doses also puts you at risk for  problems.  Do not smoke.   Monitor your blood pressure at home as directed by your health care provider. SEEK MEDICAL CARE IF:   You think you are having a reaction to medicines taken.  You have recurrent headaches or feel dizzy.  You have swelling in your ankles.  You have trouble with your vision. SEEK IMMEDIATE MEDICAL CARE IF:  You develop a severe headache or confusion.  You have unusual weakness, numbness, or feel faint.  You have severe chest or abdominal pain.  You vomit repeatedly.  You have trouble breathing. MAKE SURE YOU:   Understand these instructions.  Will watch your condition.  Will get help right away if you are not doing well or get worse.   This information is not intended to replace advice given to you by your health care provider. Make sure you discuss any questions you have with your health care provider.   Document Released: 03/05/2005 Document Revised: 07/20/2014 Document Reviewed: 12/26/2012 Elsevier Interactive Patient Education [employer] It is important that you make an appointment with a primary care physician

## 2015-08-07 NOTE — ED Notes (Signed)
To room via EMS.  Onset 6;45p pt was working on car and cut right index and middle finger with cutter.  Bleeding controlled.  Unknown last tetanus.

## 2015-08-07 NOTE — ED Provider Notes (Signed)
CSN: [id number]     Arrival date & time 08/07/15  1908 History   First MD Initiated Contact with Patient 08/07/15 1951     Chief Complaint  Patient presents with  . Laceration     (Consider location/radiation/quality/duration/timing/severity/associated sxs/prior Treatment) HPI Comments: Patient was using a box cutter when he inadvertently slipped cutting his left index and middle finger on the dorsal aspect.  No active bleeding at this time.  Last known tetanus many years ago.  Patient has had surgery on his right index finger and is slightly deformed with slight decreased range of motion, particularly at the DIP joint. He also is noted to be hypertensive at triage vision, has no personal history of hypertension.  View of patient's ED records show that for the past 2 visits in 2017.  His diastolic pressures were anywhere from 90-116.  He has not followed up with a primary care physician. He does have family history of hypertension.  Patient is a 50 y.o. male presenting with skin laceration. The history is provided by the patient.  Laceration Location:  Finger Finger laceration location:  R middle finger and R ring finger Depth:  Cutaneous Quality: avulsion   Bleeding: controlled   Laceration mechanism:  Razor Pain details:    Quality:  Dull   Severity:  Mild   Timing:  Constant   Progression:  Unchanged Foreign body present:  No foreign bodies Relieved by:  Nothing Worsened by:  Nothing tried Ineffective treatments:  None tried Tetanus status:  Out of date   Past Medical History  Diagnosis Date  . Pulmonary embolism (HCC)   . Bipolar 1 disorder (HCC)   . Subdural hematoma (HCC)   . Sleep apnea     wears CPAP  . Achilles rupture, left   . Pneumonia   . Dental caries     periodontitis   Past Surgical History  Procedure Laterality Date  . Appendectomy    . Cystectomy      right head  . Craniotomy N/A 01/19/2013    Procedure: CRANIOTOMY HEMATOMA EVACUATION SUBDURAL;   Surgeon: Hewitt Shorts, MD;  Location: [facility];  Service: Neurosurgery;  Laterality: N/A;  . Elbow surgery      right  . Fracture surgery      finger  . Achilles tendon surgery Left 10/21/2014    Procedure: Left Achilles Reconstruction;  Surgeon: Nadara Mustard, MD;  Location: [facility];  Service: Orthopedics;  Laterality: Left;  Marland Kitchen Multiple extractions with alveoloplasty N/A 03/07/2015    Procedure: MULTIPLE EXTRACTION WITH ALVEOLOPLASTY;  Surgeon: [name], DDS;  Location: [facility] OR;  Service: Oral Surgery;  Laterality: N/A;   Family History  Problem Relation Age of Onset  . Hypertension Mother   . Multiple sclerosis Sister    Social History  Substance Use Topics  . Smoking status: Never Smoker   . Smokeless tobacco: Never Used  . Alcohol Use: No    Review of Systems  Respiratory: Negative for shortness of breath.   Cardiovascular: Negative for chest pain.  Skin: Positive for wound.  Neurological: Negative for numbness.  All other systems reviewed and are negative.     Allergies  Review of patient's allergies indicates no known allergies.  Home Medications   Prior to Admission medications   Medication Sig Start Date End Date Taking? Authorizing Provider  indomethacin (INDOCIN SR) 75 MG CR capsule Take 75 mg by mouth 2 (two) times daily. 07/28/15  Yes Historical Provider, MD  lithium  600 MG capsule Take 600 mg by mouth at bedtime.   Yes Historical Provider, MD  lithium carbonate (ESKALITH) 450 MG CR tablet Take 450 mg by mouth daily.  02/24/15  Yes Historical Provider, MD  mirtazapine (REMERON) 45 MG tablet Take 45 mg by mouth at bedtime.   Yes Historical Provider, MD  QUEtiapine (SEROQUEL XR) 400 MG 24 hr tablet Take 800 mg by mouth at bedtime.   Yes Historical Provider, MD  aspirin EC 325 MG tablet Take 1 tablet (325 mg total) by mouth daily. Patient not taking: Reported on 08/07/2015 10/21/14   Nadara Mustard, MD  fluticasone [facility]) 50 MCG/ACT nasal spray Place 2  sprays into both nostrils daily. Patient not taking: Reported on 08/07/2015 04/06/15   [name], PA-C  HYDROcodone-acetaminophen (NORCO/VICODIN) 5-325 MG tablet Take 1-2 tablets by mouth every 6 hours as needed for pain and/or cough. Patient not taking: Reported on 08/07/2015 03/31/15   [name], PA-C  ibuprofen (ADVIL,MOTRIN) 600 MG tablet Take 1 tablet (600 mg total) by mouth every 6 (six) hours as needed for mild pain. Patient not taking: Reported on 08/07/2015 03/08/15   [name], DDS  naproxen (NAPROSYN) 500 MG tablet Take 1 tablet (500 mg total) by mouth 2 (two) times daily as needed for mild pain, moderate pain or headache (TAKE WITH MEALS.). Patient not taking: Reported on 08/07/2015 04/06/15   [name], PA-C  oxyCODONE-acetaminophen (PERCOCET) 10-325 MG tablet Take 1-2 tablets by mouth every 4 (four) hours as needed for pain. Patient not taking: Reported on 08/07/2015 03/08/15   [name], DDS   BP 147/104 mmHg  Pulse 81  Temp(Src) 98.5 F (36.9 C) (Oral)  Resp 18  Ht 5\' 9"  (1.753 m)  Wt 104.327 kg  BMI 33.95 kg/m2  SpO2 97% Physical Exam  Constitutional: He appears well-developed and well-nourished.  HENT:  Head: Normocephalic.  Eyes: Pupils are equal, round, and reactive to light.  Neck: Normal range of motion.  Cardiovascular: Normal rate and regular rhythm.   Pulmonary/Chest: Effort normal.  Musculoskeletal: Normal range of motion. He exhibits tenderness. He exhibits no edema.       Hands: Neurological: He is alert.  Skin: Skin is warm.  Nursing note and vitals reviewed.   ED Course  .Marland KitchenLaceration Repair Date/Time: 08/07/2015 9:15 PM Performed by: Earley Favor Authorized by: Earley Favor Consent: Verbal consent obtained. Written consent not obtained. Risks and benefits: risks, benefits and alternatives were discussed Consent given by: patient Patient understanding: patient states understanding of the procedure being  performed Patient identity confirmed: verbally with patient Time out: Immediately prior to procedure a "time out" was called to verify the correct patient, procedure, equipment, support staff and site/side marked as required. Body area: upper extremity Location details: right index finger Laceration length: 0.5 cm Foreign bodies: no foreign bodies Tendon involvement: none Nerve involvement: none Patient sedated: no Preparation: Patient was prepped and draped in the usual sterile fashion. Irrigation solution: saline Amount of cleaning: standard Debridement: none Degree of undermining: none Skin closure: glue Technique: simple Approximation difficulty: simple Patient tolerance: Patient tolerated the procedure well with no immediate complications Comments: dermabond applied to R middle finger as well laceration length .25cm superficial    (including critical care time) Labs Review Labs Reviewed - No data to display  Imaging Review No results found. I have personally reviewed and evaluated these images and lab results as part of my medical decision-making.   EKG Interpretation None      MDM  Final diagnoses:  Laceration  Essential hypertension         Earley Favor, NP 08/07/15 2128  Earley Favor, NP 08/07/15 1610  Vanetta Mulders, MD 08/09/15 [phone]

## 2015-08-08 ENCOUNTER — Encounter (HOSPITAL_COMMUNITY): Payer: Self-pay | Admitting: *Deleted

## 2015-08-08 ENCOUNTER — Emergency Department (HOSPITAL_COMMUNITY)
Admission: EM | Admit: 2015-08-08 | Discharge: 2015-08-08 | Disposition: A | Discharge status: Home / Self Care | Payer: Medicaid Other | Attending: Emergency Medicine | Admitting: Emergency Medicine

## 2015-08-08 DIAGNOSIS — G4730 Sleep apnea, unspecified: Secondary | ICD-10-CM | POA: Insufficient documentation

## 2015-08-08 DIAGNOSIS — Z87828 Personal history of other (healed) physical injury and trauma: Secondary | ICD-10-CM | POA: Diagnosis not present

## 2015-08-08 DIAGNOSIS — Z8719 Personal history of other diseases of the digestive system: Secondary | ICD-10-CM | POA: Diagnosis not present

## 2015-08-08 DIAGNOSIS — M79644 Pain in right finger(s): Secondary | ICD-10-CM | POA: Diagnosis present

## 2015-08-08 DIAGNOSIS — Z8701 Personal history of pneumonia (recurrent): Secondary | ICD-10-CM | POA: Diagnosis not present

## 2015-08-08 DIAGNOSIS — Z86711 Personal history of pulmonary embolism: Secondary | ICD-10-CM | POA: Diagnosis not present

## 2015-08-08 DIAGNOSIS — Z79899 Other long term (current) drug therapy: Secondary | ICD-10-CM | POA: Insufficient documentation

## 2015-08-08 MED ORDER — ACETAMINOPHEN 500 MG PO TABS: 500.0000 mg | ORAL_TABLET | Freq: Four times a day (QID) | ORAL | Status: DC | PRN

## 2015-08-08 MED ORDER — ACETAMINOPHEN 325 MG PO TABS
650.0000 mg | ORAL_TABLET | Freq: Once | ORAL | Status: AC
  Administered 2015-08-08: 650 mg via ORAL
  Filled 2015-08-08: qty 2

## 2015-08-08 NOTE — Discharge Instructions (Signed)
Stitches, Staples, or Adhesive Wound Closure  °Health care providers use stitches (sutures), staples, and certain glue (skin adhesives) to hold skin together while it heals (wound closure). You may need this treatment after you have surgery or if you cut your skin accidentally. These methods help your skin to heal more quickly and make it less likely that you will have a scar. A wound may take several months to heal completely.  °The type of wound you have determines when your wound gets closed. In most cases, the wound is closed as soon as possible (primary skin closure). Sometimes, closure is delayed so the wound can be cleaned and allowed to heal naturally. This reduces the chance of infection. Delayed closure may be needed if your wound:  °Is caused by a bite.  °Happened more than 6 hours ago.  °Involves loss of skin or the tissues under the skin.  °Has dirt or debris in it that cannot be removed.  °Is infected. °WHAT ARE THE DIFFERENT KINDS OF WOUND CLOSURES?  °There are many options for wound closure. The one that your health care provider uses depends on how deep and how large your wound is.  °Adhesive Glue  °To use this type of glue to close a wound, your health care provider holds the edges of the wound together and paints the glue on the surface of your skin. You may need more than one layer of glue. Then the wound may be covered with a light bandage (dressing).  °This type of skin closure may be used for small wounds that are not deep (superficial). Using glue for wound closure is less painful than other methods. It does not require a medicine that numbs the area (local anesthetic). This method also leaves nothing to be removed. Adhesive glue is often used for children and on facial wounds.  °Adhesive glue cannot be used for wounds that are deep, uneven, or bleeding. It is not used inside of a wound.  °Adhesive Strips  °These strips are made of sticky (adhesive), porous paper. They are applied across your  skin edges like a regular adhesive bandage. You leave them on until they fall off.  °Adhesive strips may be used to close very superficial wounds. They may also be used along with sutures to improve the closure of your skin edges.  °Sutures  °Sutures are the oldest method of wound closure. Sutures can be made from natural substances, such as silk, or from synthetic materials, such as nylon and steel. They can be made from a material that your body can break down as your wound heals (absorbable), or they can be made from a material that needs to be removed from your skin (nonabsorbable). They come in many different strengths and sizes.  °Your health care provider attaches the sutures to a steel needle on one end. Sutures can be passed through your skin, or through the tissues beneath your skin. Then they are tied and cut. Your skin edges may be closed in one continuous stitch or in separate stitches.  °Sutures are strong and can be used for all kinds of wounds. Absorbable sutures may be used to close tissues under the skin. The disadvantage of sutures is that they may cause skin reactions that lead to infection. Nonabsorbable sutures need to be removed.  °Staples  °When surgical staples are used to close a wound, the edges of your skin on both sides of the wound are brought close together. A staple is placed across the wound, and   an instrument secures the edges together. Staples are often used to close surgical cuts (incisions).  °Staples are faster to use than sutures, and they cause less skin reaction. Staples need to be removed using a tool that bends the staples away from your skin.  °HOW DO I CARE FOR MY WOUND CLOSURE?  °Take medicines only as directed by your health care provider.  °If you were prescribed an antibiotic medicine for your wound, finish it all even if you start to feel better.  °Use ointments or creams only as directed by your health care provider.  °Wash your hands with soap and water before and  after touching your wound.  °Do not soak your wound in water. Do not take baths, swim, or use a hot tub until your health care provider approves.  °Ask your health care provider when you can start showering. Cover your wound if directed by your health care provider.  °Do not take out your own sutures or staples.  °Do not pick at your wound. Picking can cause an infection.  °Keep all follow-up visits as directed by your health care provider. This is important. °HOW LONG WILL I HAVE MY WOUND CLOSURE?  °Leave adhesive glue on your skin until the glue peels away.  °Leave adhesive strips on your skin until the strips fall off.  °Absorbable sutures will dissolve within several days.  °Nonabsorbable sutures and staples must be removed. The location of the wound will determine how long they stay in. This can range from several days to a couple of weeks. °WHEN SHOULD I SEEK HELP FOR MY WOUND CLOSURE?  °Contact your health care provider if:  °You have a fever.  °You have chills.  °You have drainage, redness, swelling, or pain at your wound.  °There is a bad smell coming from your wound.  °The skin edges of your wound start to separate after your sutures have been removed.  °Your wound becomes thick, raised, and darker in color after your sutures come out (scarring). °This information is not intended to replace advice given to you by your health care provider. Make sure you discuss any questions you have with your health care provider.  °Document Released: 11/28/2000 Document Revised: 03/26/2014 Document Reviewed: 08/12/2013  °Elsevier Interactive Patient Education ©2016 [employer]  ° °

## 2015-08-08 NOTE — ED Notes (Signed)
Pt reports being seen last night for lacerations to right middle and index finger. Reports still having bleeding from the wounds and pain/throbbing to fingers.

## 2015-08-08 NOTE — ED Provider Notes (Signed)
CSN: [id number]     Arrival date & time 08/08/15  1145 History  By signing my name below, I, Bethel Born, attest that this documentation has been prepared under the direction and in the presence of [name], PA-C Electronically Signed: Bethel Born, ED Scribe. 08/08/2015 12:41 PM Chief Complaint  Patient presents with  . Hand Pain   The history is provided by the patient. No language interpreter was used.   Justin [name] is a 50 y.o. male who presents to the Emergency Department complaining of ongoing, constant, 8/10 in severity, throbbing pain and bleeding at the site of lacerations at the right middle and index finger with onset last night. Pt states that he was using a box cutter when it slipped and cut his fingers. He was seen in the ED after the injury where the lacerations were repaired with dermabond but he states that the fingers have continued to bleed. He states they are oozing blood when he uses his hands. Denies the wounds re-opening. He did not take anything for pain at home after leaving ED. His tetanus was updated last night. Denies new symptoms of numbness, tingling or loss of sensation in fingertips. No other new complaints.   Past Medical History  Diagnosis Date  . Pulmonary embolism (HCC)   . Bipolar 1 disorder (HCC)   . Subdural hematoma (HCC)   . Sleep apnea     wears CPAP  . Achilles rupture, left   . Pneumonia   . Dental caries     periodontitis   Past Surgical History  Procedure Laterality Date  . Appendectomy    . Cystectomy      right head  . Craniotomy N/A 01/19/2013    Procedure: CRANIOTOMY HEMATOMA EVACUATION SUBDURAL;  Surgeon: Hewitt Shorts, MD;  Location: [facility];  Service: Neurosurgery;  Laterality: N/A;  . Elbow surgery      right  . Fracture surgery      finger  . Achilles tendon surgery Left 10/21/2014    Procedure: Left Achilles Reconstruction;  Surgeon: Nadara Mustard, MD;  Location: [facility] OR;  Service: Orthopedics;  Laterality:  Left;  Marland Kitchen Multiple extractions with alveoloplasty N/A 03/07/2015    Procedure: MULTIPLE EXTRACTION WITH ALVEOLOPLASTY;  Surgeon: [name], DDS;  Location: [facility] OR;  Service: Oral Surgery;  Laterality: N/A;   Family History  Problem Relation Age of Onset  . Hypertension Mother   . Multiple sclerosis Sister    Social History  Substance Use Topics  . Smoking status: Never Smoker   . Smokeless tobacco: Never Used  . Alcohol Use: No    Review of Systems  Skin: Positive for wound (lacerations with pain at the right middle and index fingers).  All other systems reviewed and are negative.  Allergies  Review of patient's allergies indicates no known allergies.  Home Medications   Prior to Admission medications   Medication Sig Start Date End Date Taking? Authorizing Provider  acetaminophen (TYLENOL) 500 MG tablet Take 1 tablet (500 mg total) by mouth every 6 (six) hours as needed. 08/08/15   Rolm Gala Shiara Mcgough, PA-C  aspirin EC 325 MG tablet Take 1 tablet (325 mg total) by mouth daily. Patient not taking: Reported on 08/07/2015 10/21/14   Nadara Mustard, MD  fluticasone [facility]) 50 MCG/ACT nasal spray Place 2 sprays into both nostrils daily. Patient not taking: Reported on 08/07/2015 04/06/15   [name], PA-C  HYDROcodone-acetaminophen (NORCO/VICODIN) 5-325 MG tablet Take 1-2 tablets by mouth every  6 hours as needed for pain and/or cough. Patient not taking: Reported on 08/07/2015 03/31/15   [name], PA-C  ibuprofen (ADVIL,MOTRIN) 600 MG tablet Take 1 tablet (600 mg total) by mouth every 6 (six) hours as needed for mild pain. Patient not taking: Reported on 08/07/2015 03/08/15   [name], DDS  indomethacin (INDOCIN SR) 75 MG CR capsule Take 75 mg by mouth 2 (two) times daily. 07/28/15   Historical Provider, MD  lithium 600 MG capsule Take 600 mg by mouth at bedtime.    Historical Provider, MD  lithium carbonate (ESKALITH) 450 MG CR tablet Take 450 mg by mouth daily.  02/24/15    Historical Provider, MD  mirtazapine (REMERON) 45 MG tablet Take 45 mg by mouth at bedtime.    Historical Provider, MD  naproxen (NAPROSYN) 500 MG tablet Take 1 tablet (500 mg total) by mouth 2 (two) times daily as needed for mild pain, moderate pain or headache (TAKE WITH MEALS.). Patient not taking: Reported on 08/07/2015 04/06/15   [name], PA-C  oxyCODONE-acetaminophen (PERCOCET) 10-325 MG tablet Take 1-2 tablets by mouth every 4 (four) hours as needed for pain. Patient not taking: Reported on 08/07/2015 03/08/15   [name], DDS  QUEtiapine (SEROQUEL XR) 400 MG 24 hr tablet Take 800 mg by mouth at bedtime.    Historical Provider, MD   BP 114/82 mmHg  Pulse 97  Temp(Src) 98.7 F (37.1 C) (Oral)  Resp 20  SpO2 94% Physical Exam  Constitutional: He appears well-developed and well-nourished. No distress.  HENT:  Head: Normocephalic and atraumatic.  Right Ear: External ear normal.  Left Ear: External ear normal.  Eyes: Conjunctivae are normal. Right eye exhibits no discharge. Left eye exhibits no discharge. No scleral icterus.  Neck: Normal range of motion.  Cardiovascular: Normal rate.   Pulmonary/Chest: Effort normal.  Musculoskeletal: Normal range of motion.  Moves all extremities spontaneously  Neurological: He is alert. Coordination normal.  Sensation intact over distal fingertips  Skin: Skin is warm and dry.  1 cm laceration noted to the right distal index finger closed with Dermabond. No dehiscence. No active bleeding at this time. Small amount of blood noted on the Band-Aid used to cover his finger. Less than 5 mm laceration noted to right mid middle finger. Dermabond in place with no dehiscence. No bleeding from this wound. Both wounds are healing well.  Psychiatric: He has a normal mood and affect. His behavior is normal.  Nursing note and vitals reviewed.   ED Course  Procedures (including critical care time) DIAGNOSTIC STUDIES: Oxygen Saturation is  94% on RA, adequate by my interpretation.    COORDINATION OF CARE: 12:39 PM Discussed treatment plan which includes finger splinting with pt at bedside and pt agreed to plan.  Labs Review Labs Reviewed - No data to display  Imaging Review No results found.    EKG Interpretation None      MDM   Final diagnoses:  Pain of finger of right hand   50 year old male presenting with persistent pain and oozing blood at the site of lacerations repaired yesterday. Patient has not taken pain control medication since discharge yesterday. Afebrile and nontoxic appearing. Well healing wounds noted to the right distal fingers with Dermabond in place and no active bleeding. Small amount of blood noted to the and date of right index finger wound. Likely small amount of oozing due to patient continues to use his fingers. Will place right index finger in a splint for stability.  Patient is requesting Tylenol prescription as he does not have this at home. Discussed scheduling Tylenol to control pain. Return precautions given in discharge paperwork and discussed with pt at bedside. Pt stable for discharge   I personally performed the services described in this documentation, which was scribed in my presence. The recorded information has been reviewed and is accurate.    Rolm Gala Aslin Farinas, PA-C 08/08/15 1313  Bethann Berkshire, MD 08/09/15 1414

## 2015-08-08 NOTE — ED Notes (Signed)
Declined W/C at D/C and was escorted to lobby by RN. 

## 2015-08-19 ENCOUNTER — Emergency Department (HOSPITAL_COMMUNITY)
Admission: EM | Admit: 2015-08-19 | Discharge: 2015-08-19 | Disposition: A | Discharge status: Home / Self Care | Payer: Medicaid Other | Attending: Emergency Medicine | Admitting: Emergency Medicine

## 2015-08-19 ENCOUNTER — Encounter (HOSPITAL_COMMUNITY): Payer: Self-pay

## 2015-08-19 DIAGNOSIS — Z86711 Personal history of pulmonary embolism: Secondary | ICD-10-CM | POA: Diagnosis not present

## 2015-08-19 DIAGNOSIS — G4730 Sleep apnea, unspecified: Secondary | ICD-10-CM | POA: Insufficient documentation

## 2015-08-19 DIAGNOSIS — S61210D Laceration without foreign body of right index finger without damage to nail, subsequent encounter: Secondary | ICD-10-CM | POA: Diagnosis present

## 2015-08-19 DIAGNOSIS — S61212D Laceration without foreign body of right middle finger without damage to nail, subsequent encounter: Secondary | ICD-10-CM | POA: Insufficient documentation

## 2015-08-19 DIAGNOSIS — F319 Bipolar disorder, unspecified: Secondary | ICD-10-CM | POA: Diagnosis not present

## 2015-08-19 DIAGNOSIS — Z79899 Other long term (current) drug therapy: Secondary | ICD-10-CM | POA: Insufficient documentation

## 2015-08-19 DIAGNOSIS — Z8679 Personal history of other diseases of the circulatory system: Secondary | ICD-10-CM | POA: Insufficient documentation

## 2015-08-19 DIAGNOSIS — W275XXD Contact with paper-cutter, subsequent encounter: Secondary | ICD-10-CM | POA: Insufficient documentation

## 2015-08-19 DIAGNOSIS — Z8719 Personal history of other diseases of the digestive system: Secondary | ICD-10-CM | POA: Diagnosis not present

## 2015-08-19 DIAGNOSIS — Z8701 Personal history of pneumonia (recurrent): Secondary | ICD-10-CM | POA: Insufficient documentation

## 2015-08-19 DIAGNOSIS — Z9981 Dependence on supplemental oxygen: Secondary | ICD-10-CM | POA: Insufficient documentation

## 2015-08-19 DIAGNOSIS — S61219D Laceration without foreign body of unspecified finger without damage to nail, subsequent encounter: Secondary | ICD-10-CM

## 2015-08-19 MED ORDER — NAPROXEN 500 MG PO TABS: 500.0000 mg | ORAL_TABLET | Freq: Two times a day (BID) | ORAL | Status: DC

## 2015-08-19 MED ORDER — TRAMADOL HCL 50 MG PO TABS
50.0000 mg | ORAL_TABLET | Freq: Once | ORAL | Status: AC
  Administered 2015-08-19: 50 mg via ORAL
  Filled 2015-08-19: qty 1

## 2015-08-19 MED ORDER — TRAMADOL HCL 50 MG PO TABS: 50.0000 mg | ORAL_TABLET | Freq: Four times a day (QID) | ORAL | Status: DC | PRN

## 2015-08-19 NOTE — ED Notes (Signed)
Pt here for right pointer finger wound. Pt sts seen here 2 weeks ago and dermabond was applied to same, pt reports, tylenol is not helping pain, and now he has continued pian and swelling.

## 2015-08-19 NOTE — ED Provider Notes (Signed)
CSN: [id number]     Arrival date & time 08/19/15  1945 History  By signing my name below, I, Marisue Humble, attest that this documentation has been prepared under the direction and in the presence of non-physician practitioner, [name], PA-C. Electronically Signed: Marisue Humble, Scribe. 08/19/2015. 8:16 PM.   Chief Complaint  Patient presents with  . Finger Injury    The history is provided by the patient. No language interpreter was used.   HPI Comments:  Justin [name] is a 50 y.o. male who presents to the Emergency Department complaining of worsening, throbbing pain and swelling surrounding laceration on right pointer finger. Pt was evaluated in the ED 2 weeks ago for lacerations to his right second and third fingers from a small box cutter; dermabond was applied to both wounds. He states the wound on his pointer finger has not healed and will bleed intermittently without trauma. Pt reports intermittent tingling in his right finger. He notes the wound on his third finger has healed without difficulty. Pt has taken Tylenol without relief; he used a finger splint intermittently with some relief. Denies pustular drainage from the wound.  Past Medical History  Diagnosis Date  . Pulmonary embolism (HCC)   . Bipolar 1 disorder (HCC)   . Subdural hematoma (HCC)   . Sleep apnea     wears CPAP  . Achilles rupture, left   . Pneumonia   . Dental caries     periodontitis   Past Surgical History  Procedure Laterality Date  . Appendectomy    . Cystectomy      right head  . Craniotomy N/A 01/19/2013    Procedure: CRANIOTOMY HEMATOMA EVACUATION SUBDURAL;  Surgeon: Hewitt Shorts, MD;  Location: [facility];  Service: Neurosurgery;  Laterality: N/A;  . Elbow surgery      right  . Fracture surgery      finger  . Achilles tendon surgery Left 10/21/2014    Procedure: Left Achilles Reconstruction;  Surgeon: Nadara Mustard, MD;  Location: [facility] OR;  Service: Orthopedics;  Laterality:  Left;  Marland Kitchen Multiple extractions with alveoloplasty N/A 03/07/2015    Procedure: MULTIPLE EXTRACTION WITH ALVEOLOPLASTY;  Surgeon: [name], DDS;  Location: [facility] OR;  Service: Oral Surgery;  Laterality: N/A;   Family History  Problem Relation Age of Onset  . Hypertension Mother   . Multiple sclerosis Sister    Social History  Substance Use Topics  . Smoking status: Never Smoker   . Smokeless tobacco: Never Used  . Alcohol Use: No    Review of Systems  Musculoskeletal: Positive for joint swelling and arthralgias.  Skin: Positive for wound.  Neurological: Positive for numbness (tingling).    Allergies  Review of patient's allergies indicates no known allergies.  Home Medications   Prior to Admission medications   Medication Sig Start Date End Date Taking? Authorizing Provider  acetaminophen (TYLENOL) 500 MG tablet Take 1 tablet (500 mg total) by mouth every 6 (six) hours as needed. 08/08/15   Rolm Gala Barrett, PA-C  aspirin EC 325 MG tablet Take 1 tablet (325 mg total) by mouth daily. Patient not taking: Reported on 08/07/2015 10/21/14   Nadara Mustard, MD  fluticasone [facility]) 50 MCG/ACT nasal spray Place 2 sprays into both nostrils daily. Patient not taking: Reported on 08/07/2015 04/06/15   [name], PA-C  HYDROcodone-acetaminophen (NORCO/VICODIN) 5-325 MG tablet Take 1-2 tablets by mouth every 6 hours as needed for pain and/or cough. Patient not taking: Reported on 08/07/2015  03/31/15   Nicole Pisciotta, PA-C  ibuprofen (ADVIL,MOTRIN) 600 MG tablet Take 1 tablet (600 mg total) by mouth every 6 (six) hours as needed for mild pain. Patient not taking: Reported on 08/07/2015 03/08/15   [name], DDS  indomethacin (INDOCIN SR) 75 MG CR capsule Take 75 mg by mouth 2 (two) times daily. 07/28/15   Historical Provider, MD  lithium 600 MG capsule Take 600 mg by mouth at bedtime.    Historical Provider, MD  lithium carbonate (ESKALITH) 450 MG CR tablet Take 450 mg by mouth daily.   02/24/15   Historical Provider, MD  mirtazapine (REMERON) 45 MG tablet Take 45 mg by mouth at bedtime.    Historical Provider, MD  naproxen (NAPROSYN) 500 MG tablet Take 1 tablet (500 mg total) by mouth 2 (two) times daily as needed for mild pain, moderate pain or headache (TAKE WITH MEALS.). Patient not taking: Reported on 08/07/2015 04/06/15   [name], PA-C  oxyCODONE-acetaminophen (PERCOCET) 10-325 MG tablet Take 1-2 tablets by mouth every 4 (four) hours as needed for pain. Patient not taking: Reported on 08/07/2015 03/08/15   [name], DDS  QUEtiapine (SEROQUEL XR) 400 MG 24 hr tablet Take 800 mg by mouth at bedtime.    Historical Provider, MD   BP 143/98 mmHg  Pulse 106  Temp(Src) 98.9 F (37.2 C) (Oral)  Resp 18  Ht 5\' 9"  (1.753 m)  Wt 243 lb (110.224 kg)  BMI 35.87 kg/m2  SpO2 97%   Physical Exam  Constitutional: He appears well-developed and well-nourished. No distress.  HENT:  Head: Normocephalic and atraumatic.  Eyes: Conjunctivae are normal. Pupils are equal, round, and reactive to light. Right eye exhibits no discharge. Left eye exhibits no discharge.  Neck: Neck supple.  Cardiovascular: Normal rate, regular rhythm, normal heart sounds and intact distal pulses.   Pulmonary/Chest: Effort normal and breath sounds normal. No respiratory distress.  Abdominal: Soft. There is no tenderness.  Musculoskeletal:  Laceration to the dorsal right index finger just distal to the DIP; laceration is not approximated at this time; there is some granulation tissue forming; no erythema or warmth surrounding the laceration; no drainage, there is swelling proximal to the laceration  Full ROM of the finger at the DIP, PIP, and MCP.  Lymphadenopathy:    He has no cervical adenopathy.  Neurological: He is alert. Coordination normal.  distal sensation of right index finger intact  Skin: Skin is warm and dry. No rash noted. He is not diaphoretic.  Psychiatric: He has a normal  mood and affect. His behavior is normal.  Nursing note and vitals reviewed.   ED Course  Procedures  DIAGNOSTIC STUDIES:  Oxygen Saturation is 97% on RA, normal by my interpretation.    COORDINATION OF CARE:  8:12 PM Recommended pt keep the wound covered to prevent infection. Discussed treatment plan with pt at bedside and pt agreed to plan.  Labs Review Labs Reviewed - No data to display  Imaging Review No results found. I have personally reviewed and evaluated these images and lab results as part of my medical decision-making.   EKG Interpretation None      MDM   Final diagnoses:  Finger laceration, subsequent encounter   Patient presents today for recheck of a laceration of his finger.  Laceration initially repaired with Dermabond on 08/07/15.  Wound is not well approximated at this time.  However, no signs of infection.  Wound with granulation tissue present and appears to be healing.  Do not feel that further intervention is indicated at this time.  Patient stable for discharge.  Return precautions given.  I personally performed the services described in this documentation, which was scribed in my presence. The recorded information has been reviewed and is accurate.     [name], PA-C 08/20/15 0200  Lyndal Pulley, MD 08/20/15 [phone]

## 2015-08-26 ENCOUNTER — Ambulatory Visit (INDEPENDENT_AMBULATORY_CARE_PROVIDER_SITE_OTHER): Payer: Medicaid Other | Admitting: Pulmonary Disease

## 2015-08-26 ENCOUNTER — Encounter: Payer: Self-pay | Admitting: Pulmonary Disease

## 2015-08-26 VITALS — BP 138/96 | HR 90 | Ht 69.0 in | Wt 238.6 lb

## 2015-08-26 DIAGNOSIS — G4733 Obstructive sleep apnea (adult) (pediatric): Secondary | ICD-10-CM | POA: Diagnosis not present

## 2015-08-26 DIAGNOSIS — Z9989 Dependence on other enabling machines and devices: Principal | ICD-10-CM

## 2015-08-26 NOTE — Patient Instructions (Signed)
Will call with results of CPAP download  Follow up in 1 year 

## 2015-08-26 NOTE — Progress Notes (Signed)
Current Outpatient Prescriptions on File Prior to Visit  Medication Sig  . acetaminophen (TYLENOL) 500 MG tablet Take 1 tablet (500 mg total) by mouth every 6 (six) hours as needed.  Marland Kitchen aspirin EC 325 MG tablet Take 1 tablet (325 mg total) by mouth daily.  . fluticasone (FLONASE) 50 MCG/ACT nasal spray Place 2 sprays into both nostrils daily.  Marland Kitchen HYDROcodone-acetaminophen (NORCO/VICODIN) 5-325 MG tablet Take 1-2 tablets by mouth every 6 hours as needed for pain and/or cough.  Marland Kitchen ibuprofen (ADVIL,MOTRIN) 600 MG tablet Take 1 tablet (600 mg total) by mouth every 6 (six) hours as needed for mild pain.  . indomethacin (INDOCIN SR) 75 MG CR capsule Take 75 mg by mouth 2 (two) times daily.  Marland Kitchen lithium 600 MG capsule Take 600 mg by mouth at bedtime.  Marland Kitchen lithium carbonate (ESKALITH) 450 MG CR tablet Take 450 mg by mouth daily.   . mirtazapine (REMERON) 45 MG tablet Take 45 mg by mouth at bedtime.  . naproxen (NAPROSYN) 500 MG tablet Take 1 tablet (500 mg total) by mouth 2 (two) times daily.  Marland Kitchen oxyCODONE-acetaminophen (PERCOCET) 10-325 MG tablet Take 1-2 tablets by mouth every 4 (four) hours as needed for pain.  Marland Kitchen QUEtiapine (SEROQUEL XR) 400 MG 24 hr tablet Take 800 mg by mouth at bedtime.  . traMADol (ULTRAM) 50 MG tablet Take 1 tablet (50 mg total) by mouth every 6 (six) hours as needed.   No current facility-administered medications on file prior to visit.     Chief Complaint  Patient presents with  . Follow-up    Former [facility] patient: Using CPAP nightly. denies problems with mask/pressure. DME: [employer].     Tests PSG 05/21/13 >> AHI 23  Past medical hx PE, Bipolar, SDH s/p craniotomy 2014, PNA  Past surgical hx, Allergies, Family hx, Social hx all reviewed.  Vital Signs BP 138/96 mmHg  Pulse 90  Ht 5\' 9"  (1.753 m)  Wt 238 lb 9.6 oz (108.228 kg)  BMI 35.22 kg/m2  SpO2 97%  History of Present Illness Justin [name] is a 50 y.o. male with obstructive sleep apnea.  He has been doing well  with CPAP.  He has full face mask.  He goes to bed at 10 pm.  He takes about an hour to fall asleep.  He gets up at 9 am.  He doesn't feel the need to sleep during the day.  Physical Exam  General - No distress ENT - No sinus tenderness, no oral exudate, no LAN, MP 4, enlarged tongue Cardiac - s1s2 regular, no murmur Chest - No wheeze/rales/dullness Back - No focal tenderness Abd - Soft, non-tender Ext - No edema Neuro - Normal strength Skin - No rashes Psych - normal mood, and behavior   Assessment/Plan  Obstructive sleep apnea. - will get copy of his CPAP download and call him with results   Patient Instructions  Will call with results of CPAP download  Follow up in 1 year     Coralyn Helling, MD [place] [facility] Pager:  [phone] 08/26/2015, 2:20 PM

## 2015-08-30 ENCOUNTER — Telehealth: Payer: Self-pay | Admitting: Pulmonary Disease

## 2015-08-30 NOTE — Telephone Encounter (Signed)
Auto CPAP 05/13/15 to 06/11/15 >> used on 16 of 30 nights with average 8 hrs 40 min.  Average AHI 3.5 with median CPAP 11 and 95 th percentile CPAP 16 cm H2O.   Will have my nurse inform pt that CPAP report shows good control of sleep apnea.  He needs to use for entire time he is asleep to get maximal benefit.

## 2015-08-31 NOTE — Telephone Encounter (Signed)
Results have been explained to patient, pt expressed understanding. Nothing further needed.  

## 2015-09-17 ENCOUNTER — Emergency Department (HOSPITAL_COMMUNITY)
Admission: EM | Admit: 2015-09-17 | Discharge: 2015-09-17 | Disposition: A | Discharge status: Home / Self Care | Payer: Medicaid Other | Attending: Emergency Medicine | Admitting: Emergency Medicine

## 2015-09-17 ENCOUNTER — Encounter (HOSPITAL_COMMUNITY): Payer: Self-pay | Admitting: Emergency Medicine

## 2015-09-17 DIAGNOSIS — Z7982 Long term (current) use of aspirin: Secondary | ICD-10-CM | POA: Diagnosis not present

## 2015-09-17 DIAGNOSIS — Z79899 Other long term (current) drug therapy: Secondary | ICD-10-CM | POA: Insufficient documentation

## 2015-09-17 DIAGNOSIS — M79671 Pain in right foot: Secondary | ICD-10-CM | POA: Insufficient documentation

## 2015-09-17 LAB — CBG MONITORING, ED: Glucose-Capillary: 87 mg/dL (ref 65–99)

## 2015-09-17 MED ORDER — NAPROXEN 500 MG PO TABS: 500.0000 mg | ORAL_TABLET | Freq: Two times a day (BID) | ORAL | Status: DC

## 2015-09-17 MED ORDER — KETOROLAC TROMETHAMINE 60 MG/2ML IM SOLN
60.0000 mg | Freq: Once | INTRAMUSCULAR | Status: AC
  Administered 2015-09-17: 60 mg via INTRAMUSCULAR
  Filled 2015-09-17: qty 2

## 2015-09-17 MED ORDER — GABAPENTIN 300 MG PO CAPS
300.0000 mg | ORAL_CAPSULE | Freq: Two times a day (BID) | ORAL | Status: DC
Start: 2015-09-17 — End: 2016-02-29

## 2015-09-17 NOTE — ED Notes (Signed)
CBG 87. 

## 2015-09-17 NOTE — ED Provider Notes (Signed)
CSN: [id number]     Arrival date & time 09/17/15  1416 History  By signing my name below, I, Evon Slack, attest that this documentation has been prepared under the direction and in the presence of Shawn Joy, PA-C. Electronically Signed: Evon Slack, ED Scribe. 09/17/2015. 2:26 PM.      Chief Complaint  Patient presents with  . Foot Pain   Patient is a 50 y.o. male presenting with lower extremity pain. The history is provided by the patient. No language interpreter was used.  Foot Pain   HPI Comments: Justin [name] is a 50 y.o. male who presents to the Emergency Department complaining of constant burning foot pain onset 1 month ago. Pt states that when he is resting it feels as if the burning pain is radiating up to his calf. Pt also report that he has some numbness and tingling in his toes. Pt states that the pain is worse when ambulating. Patient is still able to ambulate, although painful. Pt denies any medications PTA. Pt denies hx of DM, urinary frequency, or polydipsia. Pt reports he recently had negative gout test performed by his PCP. Denies trauma.    Past Medical History  Diagnosis Date  . Pulmonary embolism (HCC)   . Bipolar 1 disorder (HCC)   . Subdural hematoma (HCC)   . Sleep apnea     wears CPAP  . Achilles rupture, left   . Pneumonia   . Dental caries     periodontitis   Past Surgical History  Procedure Laterality Date  . Appendectomy    . Cystectomy      right head  . Craniotomy N/A 01/19/2013    Procedure: CRANIOTOMY HEMATOMA EVACUATION SUBDURAL;  Surgeon: Hewitt Shorts, MD;  Location: [facility];  Service: Neurosurgery;  Laterality: N/A;  . Elbow surgery      right  . Fracture surgery      finger  . Achilles tendon surgery Left 10/21/2014    Procedure: Left Achilles Reconstruction;  Surgeon: Nadara Mustard, MD;  Location: [facility] OR;  Service: Orthopedics;  Laterality: Left;  Marland Kitchen Multiple extractions with alveoloplasty N/A 03/07/2015    Procedure:  MULTIPLE EXTRACTION WITH ALVEOLOPLASTY;  Surgeon: [name], DDS;  Location: [facility] OR;  Service: Oral Surgery;  Laterality: N/A;   Family History  Problem Relation Age of Onset  . Hypertension Mother   . Multiple sclerosis Sister    Social History  Substance Use Topics  . Smoking status: Never Smoker   . Smokeless tobacco: Never Used  . Alcohol Use: No    Review of Systems  Endocrine: Negative for polydipsia.  Genitourinary: Negative for frequency.  Musculoskeletal: Positive for arthralgias.  Neurological: Negative for weakness.       Tingling in the right toes.     Allergies  Review of patient's allergies indicates no known allergies.  Home Medications   Prior to Admission medications   Medication Sig Start Date End Date Taking? Authorizing Provider  acetaminophen (TYLENOL) 500 MG tablet Take 1 tablet (500 mg total) by mouth every 6 (six) hours as needed. 08/08/15   Rolm Gala Barrett, PA-C  aspirin EC 325 MG tablet Take 1 tablet (325 mg total) by mouth daily. 10/21/14   Nadara Mustard, MD  fluticasone (FLONASE) 50 MCG/ACT nasal spray Place 2 sprays into both nostrils daily. 04/06/15   [name], PA-C  gabapentin (NEURONTIN) 300 MG capsule Take 1 capsule (300 mg total) by mouth 2 (two) times daily. 09/17/15  Anselm Pancoast, PA-C  HYDROcodone-acetaminophen (NORCO/VICODIN) 5-325 MG tablet Take 1-2 tablets by mouth every 6 hours as needed for pain and/or cough. 03/31/15   Nicole Pisciotta, PA-C  ibuprofen (ADVIL,MOTRIN) 600 MG tablet Take 1 tablet (600 mg total) by mouth every 6 (six) hours as needed for mild pain. 03/08/15   [name], DDS  indomethacin (INDOCIN SR) 75 MG CR capsule Take 75 mg by mouth 2 (two) times daily. 07/28/15   Historical Provider, MD  lithium 600 MG capsule Take 600 mg by mouth at bedtime.    Historical Provider, MD  lithium carbonate (ESKALITH) 450 MG CR tablet Take 450 mg by mouth daily.  02/24/15   Historical Provider, MD  mirtazapine (REMERON) 45 MG  tablet Take 45 mg by mouth at bedtime.    Historical Provider, MD  naproxen (NAPROSYN) 500 MG tablet Take 1 tablet (500 mg total) by mouth 2 (two) times daily. 08/19/15   [name], PA-C  naproxen (NAPROSYN) 500 MG tablet Take 1 tablet (500 mg total) by mouth 2 (two) times daily. 09/17/15   [name], PA-C  oxyCODONE-acetaminophen (PERCOCET) 10-325 MG tablet Take 1-2 tablets by mouth every 4 (four) hours as needed for pain. 03/08/15   [name], DDS  QUEtiapine (SEROQUEL XR) 400 MG 24 hr tablet Take 800 mg by mouth at bedtime.    Historical Provider, MD  traMADol (ULTRAM) 50 MG tablet Take 1 tablet (50 mg total) by mouth every 6 (six) hours as needed. 08/19/15   [name], PA-C   BP 132/92 mmHg  Pulse 93  Temp(Src) 98.3 F (36.8 C) (Oral)  Resp 18  SpO2 98%   Physical Exam  Constitutional: He is oriented to person, place, and time. He appears well-developed and well-nourished. No distress.  HENT:  Head: Normocephalic and atraumatic.  Eyes: Conjunctivae are normal.  Neck: Neck supple.  Cardiovascular: Normal rate and regular rhythm.   Pulmonary/Chest: Effort normal.  Musculoskeletal: Normal range of motion. He exhibits tenderness.  Tenderness to plantar surface of foot fromm calcaneus to the ball of foot, motor intact in toes. No swelling, erythema, or wounds noted.  Neurological: He is alert and oriented to person, place, and time.  Paresthesias in the right toes. Strength 5 out of 5 in the ankle and toes.  Skin: Skin is warm and dry. He is not diaphoretic.  Psychiatric: He has a normal mood and affect. His behavior is normal.  Nursing note and vitals reviewed.   ED Course  Procedures (including critical care time) DIAGNOSTIC STUDIES: Oxygen Saturation is 99% on RA, normal by my interpretation.    COORDINATION OF CARE: 2:27 PM-Discussed treatment plan which includes NSAID's and orthopedic referral with pt at bedside and pt agreed to plan.   Labs Review Labs  Reviewed  CBG MONITORING, ED   Orders Placed This Encounter  Procedures  . Post op shoe  . Re-check Vital Signs  . POC CBG, ED    MDM   Final diagnoses:  Right foot pain    Suspect plantar fasciitis versus neuropathy.This patient's presentation and physical exam findings do not suggest septic joint or gout. No red flag symptoms. This is a chronic issue. Patient to follow up with podiatry or orthopedics. Will try Neurontin and NSAIDs. Postop shoe and crutches. Patient states he has crutches at home. Further home care and return precautions discussed. Patient voiced understanding of these instructions, accepts the plan, and is comfortable with discharge.  Filed Vitals:   09/17/15 1422 09/17/15  1449  BP: 149/100 132/92  Pulse: 107 93  Temp: 98.3 F (36.8 C)   TempSrc: Oral   Resp: 18 18  SpO2: 99% 98%   I personally performed the services described in this documentation, which was scribed in my presence. The recorded information has been reviewed and is accurate.      Anselm Pancoast, PA-C 09/17/15 1620  Doug Sou, MD 09/17/15 1729

## 2015-09-17 NOTE — Discharge Instructions (Signed)
You have been seen today for foot pain. It is suspected your foot pain is either from an inflammation of the bottom of the foot, called plantar fasciitis; or neuropathic pain. Use naproxen or ibuprofen to reduce pain and inflammation. Apply ice or heat for comfort. Use the postop shoe as needed for comfort as well. Take the Neurontin to reduce neuropathic pain. It is recommended that you follow-up with a podiatrist or orthopedic surgeon as soon as possible. Follow up with PCP as needed.

## 2015-09-17 NOTE — ED Notes (Signed)
C/O "BURNING PAIN" TO BOTTOM OF RIGHT FOOT X "MONTHS". HAS HAD TESTING FOR GOUT PER PCP IN THE PAST, APPARENTLY NEG.  NO KNOWN INJURY.

## 2015-10-06 ENCOUNTER — Emergency Department (HOSPITAL_COMMUNITY)
Admission: EM | Admit: 2015-10-06 | Discharge: 2015-10-06 | Disposition: A | Discharge status: Home / Self Care | Payer: Medicaid Other | Attending: Emergency Medicine | Admitting: Emergency Medicine

## 2015-10-06 ENCOUNTER — Encounter (HOSPITAL_COMMUNITY): Payer: Self-pay | Admitting: Emergency Medicine

## 2015-10-06 DIAGNOSIS — M79671 Pain in right foot: Secondary | ICD-10-CM | POA: Insufficient documentation

## 2015-10-06 DIAGNOSIS — Z79899 Other long term (current) drug therapy: Secondary | ICD-10-CM | POA: Diagnosis not present

## 2015-10-06 DIAGNOSIS — Z7982 Long term (current) use of aspirin: Secondary | ICD-10-CM | POA: Insufficient documentation

## 2015-10-06 DIAGNOSIS — G8929 Other chronic pain: Secondary | ICD-10-CM | POA: Diagnosis not present

## 2015-10-06 MED ORDER — PREDNISONE 20 MG PO TABS: 40.0000 mg | ORAL_TABLET | Freq: Every day | ORAL | Status: DC

## 2015-10-06 MED ORDER — IBUPROFEN 200 MG PO TABS
600.0000 mg | ORAL_TABLET | Freq: Once | ORAL | Status: AC
  Administered 2015-10-06: 600 mg via ORAL
  Filled 2015-10-06: qty 1

## 2015-10-06 MED ORDER — IBUPROFEN 600 MG PO TABS: 600.0000 mg | ORAL_TABLET | Freq: Four times a day (QID) | ORAL | Status: DC | PRN

## 2015-10-06 MED ORDER — PREDNISONE 20 MG PO TABS
40.0000 mg | ORAL_TABLET | Freq: Once | ORAL | Status: AC
  Administered 2015-10-06: 40 mg via ORAL
  Filled 2015-10-06: qty 2

## 2015-10-06 NOTE — Discharge Instructions (Signed)
Please follow up with your primary care provider. Please see an orthopedist when it is financially feasible.

## 2015-10-06 NOTE — ED Notes (Signed)
Pt sts right foot pain x months

## 2015-10-06 NOTE — ED Provider Notes (Signed)
CSN: [id number]     Arrival date & time 10/06/15  1436 History  By signing my name below, I, Jasmyn B. Alexander, attest that this documentation has been prepared under the direction and in the presence of Ophelia Sipe Y Shyasia Funches, [place].  Electronically Signed: Gillis Ends. Lyn Hollingshead, ED Scribe. 10/06/2015. 3:38 PM.    Chief Complaint  Patient presents with  . Foot Pain     The history is provided by the patient. No language interpreter was used.    HPI Comments: Justin [name] is a 50 y.o. male who presents to the Emergency Department complaining of gradual worsening, constant, burning right calcaneus pain x 2 months. Pt was seen on 09/17/15 for same complaint and was given a post-op shoe with order of Neurontin and NSAIDs. He reports that post-op shoe is too big and has not been wearing it. Pain is exacerbated while ambulating and applying pressure. He has taken Ibuprofen with no relief of pain. Pt has also seen his PCP for complaint. Unable to visit Orthopedics due to insurance. Denies any numbness, weakness, or fever.  Past Medical History  Diagnosis Date  . Pulmonary embolism (HCC)   . Bipolar 1 disorder (HCC)   . Subdural hematoma (HCC)   . Sleep apnea     wears CPAP  . Achilles rupture, left   . Pneumonia   . Dental caries     periodontitis   Past Surgical History  Procedure Laterality Date  . Appendectomy    . Cystectomy      right head  . Craniotomy N/A 01/19/2013    Procedure: CRANIOTOMY HEMATOMA EVACUATION SUBDURAL;  Surgeon: Hewitt Shorts, MD;  Location: [facility];  Service: Neurosurgery;  Laterality: N/A;  . Elbow surgery      right  . Fracture surgery      finger  . Achilles tendon surgery Left 10/21/2014    Procedure: Left Achilles Reconstruction;  Surgeon: Nadara Mustard, MD;  Location: [facility] OR;  Service: Orthopedics;  Laterality: Left;  Marland Kitchen Multiple extractions with alveoloplasty N/A 03/07/2015    Procedure: MULTIPLE EXTRACTION WITH ALVEOLOPLASTY;  Surgeon: [name], DDS;   Location: [facility] OR;  Service: Oral Surgery;  Laterality: N/A;   Family History  Problem Relation Age of Onset  . Hypertension Mother   . Multiple sclerosis Sister    Social History  Substance Use Topics  . Smoking status: Never Smoker   . Smokeless tobacco: Never Used  . Alcohol Use: No    Review of Systems 10 Systems reviewed and all are negative for acute change except as noted in the HPI.  Allergies  Review of patient's allergies indicates no known allergies.  Home Medications   Prior to Admission medications   Medication Sig Start Date End Date Taking? Authorizing Provider  acetaminophen (TYLENOL) 500 MG tablet Take 1 tablet (500 mg total) by mouth every 6 (six) hours as needed. 08/08/15   Rolm Gala Barrett, PA-C  aspirin EC 325 MG tablet Take 1 tablet (325 mg total) by mouth daily. 10/21/14   Nadara Mustard, MD  fluticasone (FLONASE) 50 MCG/ACT nasal spray Place 2 sprays into both nostrils daily. 04/06/15   [name], PA-C  gabapentin (NEURONTIN) 300 MG capsule Take 1 capsule (300 mg total) by mouth 2 (two) times daily. 09/17/15   [name], PA-C  HYDROcodone-acetaminophen (NORCO/VICODIN) 5-325 MG tablet Take 1-2 tablets by mouth every 6 hours as needed for pain and/or cough. 03/31/15   Nicole Pisciotta, PA-C  ibuprofen (ADVIL,MOTRIN)  600 MG tablet Take 1 tablet (600 mg total) by mouth every 6 (six) hours as needed for mild pain. 03/08/15   [name], DDS  indomethacin (INDOCIN SR) 75 MG CR capsule Take 75 mg by mouth 2 (two) times daily. 07/28/15   Historical Provider, MD  lithium 600 MG capsule Take 600 mg by mouth at bedtime.    Historical Provider, MD  lithium carbonate (ESKALITH) 450 MG CR tablet Take 450 mg by mouth daily.  02/24/15   Historical Provider, MD  mirtazapine (REMERON) 45 MG tablet Take 45 mg by mouth at bedtime.    Historical Provider, MD  naproxen (NAPROSYN) 500 MG tablet Take 1 tablet (500 mg total) by mouth 2 (two) times daily. 08/19/15   [name],  PA-C  naproxen (NAPROSYN) 500 MG tablet Take 1 tablet (500 mg total) by mouth 2 (two) times daily. 09/17/15   [name], PA-C  oxyCODONE-acetaminophen (PERCOCET) 10-325 MG tablet Take 1-2 tablets by mouth every 4 (four) hours as needed for pain. 03/08/15   [name], DDS  QUEtiapine (SEROQUEL XR) 400 MG 24 hr tablet Take 800 mg by mouth at bedtime.    Historical Provider, MD  traMADol (ULTRAM) 50 MG tablet Take 1 tablet (50 mg total) by mouth every 6 (six) hours as needed. 08/19/15   [name], PA-C   BP 146/94 mmHg  Pulse 100  Temp(Src) 98.2 F (36.8 C) (Oral)  Resp 18  SpO2 97% Physical Exam  Constitutional: He is oriented to person, place, and time. He appears well-developed and well-nourished. No distress.  HENT:  Head: Normocephalic and atraumatic.  Eyes: Conjunctivae are normal.  Cardiovascular: Normal rate.   Pulmonary/Chest: Effort normal.  Abdominal: He exhibits no distension.  Musculoskeletal:  Right heel tender No edema or discoloration FROM of ankle  Neurological: He is alert and oriented to person, place, and time.  Skin: Skin is warm and dry.  Psychiatric: He has a normal mood and affect.  Nursing note and vitals reviewed.   ED Course  Procedures (including critical care time) DIAGNOSTIC STUDIES: Oxygen Saturation is 97% on RA, normal by my interpretation.    COORDINATION OF CARE: 3:37 PM-Discussed treatment plan which includes order of Prednisone, Ibuprofen, and smaller post-op shoe with pt at bedside and pt agreed to plan.   MDM   Final diagnoses:  Chronic foot pain, right    Pt here with chronic right heel pain. Worse after standing for a long time or walking long distances. Suspect plantar fasciitis. No paresthesias or h/o diabetes. No injury or trauma. Neurovascularly intact. Re-ordered post-op shoe as the size large was too big for him last time. Encouraged ortho f/u when financially feasible. ER return precautions given.  I personally  performed the services described in this documentation, which was scribed in my presence. The recorded information has been reviewed and is accurate.   Carlene Coria, PA-C 10/07/15 1610  Maia Plan, MD 10/07/15 1600

## 2015-11-08 ENCOUNTER — Other Ambulatory Visit (HOSPITAL_COMMUNITY): Payer: Self-pay | Admitting: Orthopedic Surgery

## 2015-11-08 ENCOUNTER — Ambulatory Visit (HOSPITAL_COMMUNITY)
Admission: RE | Admit: 2015-11-08 | Discharge: 2015-11-08 | Disposition: A | Discharge status: Home / Self Care | Payer: Medicaid Other | Source: Ambulatory Visit | Attending: Orthopedic Surgery | Admitting: Orthopedic Surgery

## 2015-11-08 DIAGNOSIS — R52 Pain, unspecified: Secondary | ICD-10-CM | POA: Insufficient documentation

## 2015-11-08 NOTE — Progress Notes (Addendum)
*  PRELIMINARY RESULTS* Vascular Ultrasound Right lower extremity venous duplex has been completed.  Preliminary findings: No evidence of DVT or baker's cyst.  Called results to Dr. Lajoyce Corners.  Farrel Demark, RDMS, RVT  11/08/2015, 2:59 PM

## 2015-11-28 ENCOUNTER — Encounter (HOSPITAL_COMMUNITY): Payer: Self-pay

## 2015-11-28 ENCOUNTER — Emergency Department (HOSPITAL_COMMUNITY)
Admission: EM | Admit: 2015-11-28 | Discharge: 2015-11-28 | Disposition: A | Discharge status: Home / Self Care | Payer: Medicaid Other | Attending: Emergency Medicine | Admitting: Emergency Medicine

## 2015-11-28 ENCOUNTER — Emergency Department (HOSPITAL_COMMUNITY): Payer: Medicaid Other

## 2015-11-28 DIAGNOSIS — X509XXA Other and unspecified overexertion or strenuous movements or postures, initial encounter: Secondary | ICD-10-CM | POA: Insufficient documentation

## 2015-11-28 DIAGNOSIS — Y999 Unspecified external cause status: Secondary | ICD-10-CM | POA: Diagnosis not present

## 2015-11-28 DIAGNOSIS — Y939 Activity, unspecified: Secondary | ICD-10-CM | POA: Diagnosis not present

## 2015-11-28 DIAGNOSIS — M25571 Pain in right ankle and joints of right foot: Secondary | ICD-10-CM | POA: Diagnosis present

## 2015-11-28 DIAGNOSIS — Z7982 Long term (current) use of aspirin: Secondary | ICD-10-CM | POA: Diagnosis not present

## 2015-11-28 DIAGNOSIS — Z7901 Long term (current) use of anticoagulants: Secondary | ICD-10-CM | POA: Diagnosis not present

## 2015-11-28 DIAGNOSIS — Y929 Unspecified place or not applicable: Secondary | ICD-10-CM | POA: Diagnosis not present

## 2015-11-28 MED ORDER — HYDROCODONE-ACETAMINOPHEN 5-325 MG PO TABS: 1.0000 | ORAL_TABLET | ORAL | 0 refills | Status: DC | PRN

## 2015-11-28 MED ORDER — HYDROCODONE-ACETAMINOPHEN 5-325 MG PO TABS
1.0000 | ORAL_TABLET | Freq: Once | ORAL | Status: AC
  Administered 2015-11-28: 1 via ORAL
  Filled 2015-11-28: qty 1

## 2015-11-28 MED ORDER — IBUPROFEN 600 MG PO TABS: 600.0000 mg | ORAL_TABLET | Freq: Four times a day (QID) | ORAL | 0 refills | Status: DC | PRN

## 2015-11-28 NOTE — ED Provider Notes (Signed)
[facility]-EMERGENCY DEPT Provider Note   CSN: [id number] Arrival date & time: 11/28/15  1456  By signing my name below, I, Clovis Pu, attest that this documentation has been prepared under the direction and in the presence of Tanessa Tidd, [place]. Electronically Signed: Clovis Pu, ED Scribe. 11/28/15. 4:03 PM.    History   Chief Complaint No chief complaint on file.   The history is provided by the patient. No language interpreter was used.    HPI Comments:  Justin [name] is a 50 y.o. male who presents to the Emergency Department complaining of acute onset, moderate right leg pain which began after an incident which occurred yesteraday. Pt states he was stepping up on a step when he over pronated his foot heard something "pop". He states the severity of the pain as being a "9/10" and notes the pain radiates up his calf to his knee. He notes the pain is exacerbated when he pronates his foot and when he applies pressure it. Pt states he tore his achilles in his left foot and states today's symptoms do not feel as severe as they did from this previous incident.  No alleviating factors noted.     Past Medical History:  Diagnosis Date  . Achilles rupture, left   . Bipolar 1 disorder (HCC)   . Dental caries    periodontitis  . Pneumonia   . Pulmonary embolism (HCC)   . Sleep apnea    wears CPAP  . Subdural hematoma [facility])     Patient Active Problem List   Diagnosis Date Noted  . Obstructive sleep apnea on CPAP 03/07/2015  . Post-operative state 03/07/2015  . Achilles rupture, left 10/21/2014  . OSA (obstructive sleep apnea) 04/23/2013  . Bipolar disorder, unspecified (HCC) 01/07/2013  . Sinus tachycardia (HCC) 01/07/2013  . Headache(784.0) 01/07/2013  . SDH (subdural hematoma) (HCC) 01/04/2013  . H/O PE 01/04/2013  . Chronic anticoagulation 01/04/2013  . 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  . Acute sinusitis 01/04/2013    Past Surgical History:  Procedure  Laterality Date  . ACHILLES TENDON SURGERY Left 10/21/2014   Procedure: Left Achilles Reconstruction;  Surgeon: Nadara Mustard, MD;  Location: [facility] OR;  Service: Orthopedics;  Laterality: Left;  . APPENDECTOMY    . CRANIOTOMY N/A 01/19/2013   Procedure: CRANIOTOMY HEMATOMA EVACUATION SUBDURAL;  Surgeon: Hewitt Shorts, MD;  Location: [facility];  Service: Neurosurgery;  Laterality: N/A;  . CYSTECTOMY     right head  . ELBOW SURGERY     right  . FRACTURE SURGERY     finger  . MULTIPLE EXTRACTIONS WITH ALVEOLOPLASTY N/A 03/07/2015   Procedure: MULTIPLE EXTRACTION WITH ALVEOLOPLASTY;  Surgeon: [name], DDS;  Location: [facility] OR;  Service: Oral Surgery;  Laterality: N/A;       Home Medications    Prior to Admission medications   Medication Sig Start Date End Date Taking? Authorizing Provider  acetaminophen (TYLENOL) 500 MG tablet Take 1 tablet (500 mg total) by mouth every 6 (six) hours as needed. Patient not taking: Reported on 10/06/2015 08/08/15   [name], PA-C  aspirin EC 325 MG tablet Take 1 tablet (325 mg total) by mouth daily. Patient not taking: Reported on 10/06/2015 10/21/14   Nadara Mustard, MD  fluticasone [facility]) 50 MCG/ACT nasal spray Place 2 sprays into both nostrils daily. Patient not taking: Reported on 10/06/2015 04/06/15   [name], PA-C  gabapentin (NEURONTIN) 300 MG capsule Take 1 capsule (300 mg total) by mouth 2 (  two) times daily. 09/17/15   [name], PA-C  HYDROcodone-acetaminophen (NORCO/VICODIN) 5-325 MG tablet Take 1-2 tablets by mouth every 6 hours as needed for pain and/or cough. Patient not taking: Reported on 10/06/2015 03/31/15   [name], PA-C  ibuprofen (ADVIL,MOTRIN) 600 MG tablet Take 1 tablet (600 mg total) by mouth every 6 (six) hours as needed for mild pain. Patient not taking: Reported on 10/06/2015 03/08/15   [name], DDS  ibuprofen (ADVIL,MOTRIN) 600 MG tablet Take 1 tablet (600 mg total) by mouth every 6 (six) hours as  needed. 10/06/15   Ace Gins Kesean Serviss, PA-C  lithium 600 MG capsule Take 600 mg by mouth at bedtime.    Historical Provider, MD  mirtazapine (REMERON) 45 MG tablet Take 45 mg by mouth at bedtime.    Historical Provider, MD  naproxen (NAPROSYN) 500 MG tablet Take 1 tablet (500 mg total) by mouth 2 (two) times daily. Patient not taking: Reported on 10/06/2015 08/19/15   [name], PA-C  naproxen (NAPROSYN) 500 MG tablet Take 1 tablet (500 mg total) by mouth 2 (two) times daily. Patient not taking: Reported on 10/06/2015 09/17/15   Anselm Pancoast, PA-C  oxyCODONE-acetaminophen (PERCOCET) 10-325 MG tablet Take 1-2 tablets by mouth every 4 (four) hours as needed for pain. Patient not taking: Reported on 10/06/2015 03/08/15   [name], DDS  predniSONE (DELTASONE) 20 MG tablet Take 2 tablets (40 mg total) by mouth daily. 10/06/15   Ace Gins Maynard David, PA-C  QUEtiapine (SEROQUEL XR) 400 MG 24 hr tablet Take 800 mg by mouth at bedtime.    Historical Provider, MD  traMADol (ULTRAM) 50 MG tablet Take 1 tablet (50 mg total) by mouth every 6 (six) hours as needed. Patient not taking: Reported on 10/06/2015 08/19/15   [name], PA-C    Family History Family History  Problem Relation Age of Onset  . Hypertension Mother   . Multiple sclerosis Sister     Social History Social History  Substance Use Topics  . Smoking status: Never Smoker  . Smokeless tobacco: Never Used  . Alcohol use No     Allergies   Review of patient's allergies indicates no known allergies.   Review of Systems Review of Systems 10 systems reviewed and all are negative for acute change except as noted in the HPI.    Physical Exam Updated Vital Signs BP 148/90 (BP Location: Right Arm)   Pulse 90   Temp 98.1 F (36.7 C) (Oral)   Resp 18   SpO2 100%   Physical Exam  Constitutional: He appears well-developed and well-nourished. No distress.  HENT:  Head: Normocephalic and atraumatic.  Neck: Neck supple.  Pulmonary/Chest:  Effort normal.  Musculoskeletal: He exhibits tenderness.  Right ankle with posterior and medial tenderness to palpation. Achilles tendon palpable with no defect Mildly limited ROM due to pain. Negative Thompson's test. 2+ DP and PT pulse  Neurological: He is alert.  Skin: He is not diaphoretic.  Nursing note and vitals reviewed.    ED Treatments / Results  DIAGNOSTIC STUDIES:  Oxygen Saturation is 100% on RA, normal by my interpretation.    COORDINATION OF CARE:  4:12 PM Discussed treatment plan with pt at bedside and pt agreed to plan.  Labs (all labs ordered are listed, but only abnormal results are displayed) Labs Reviewed - No data to display  EKG  EKG Interpretation None       Radiology No results found.  Procedures Procedures (including critical care  time)  Medications Ordered in ED Medications - No data to display   Initial Impression / Assessment and Plan / ED Course  I have reviewed the triage vital signs and the nursing notes.  Pertinent labs & imaging results that were available during my care of the patient were reviewed by me and considered in my medical decision making (see chart for details).  Clinical Course    Patient X-Ray negative for obvious fracture or dislocation. Negative DVT study 11/08/15. Neurovascularly intact. Possible achilles tendinopathy but doubt complete tear. Pt advised to follow up with orthopedics (Dr. Lajoyce Cornersuda).. Patient given brace while in ED, conservative therapy recommended and discussed. Patient will be discharged home & is agreeable with above plan. Returns precautions discussed. Pt appears safe for discharge.   Final Clinical Impressions(s) / ED Diagnoses   Final diagnoses:  Right ankle pain    New Prescriptions Discharge Medication List as of 11/28/2015  5:34 PM    START taking these medications   Details  !! HYDROcodone-acetaminophen (NORCO/VICODIN) 5-325 MG tablet Take 1 tablet by mouth every 4 (four) hours as  needed for severe pain., Starting Mon 11/28/2015, Print    !! ibuprofen (ADVIL,MOTRIN) 600 MG tablet Take 1 tablet (600 mg total) by mouth every 6 (six) hours as needed., Starting Mon 11/28/2015, Print     !! - Potential duplicate medications found. Please discuss with provider.     I personally performed the services described in this documentation, which was scribed in my presence. The recorded information has been reviewed and is accurate.   Carlene CoriaSerena Y Aalani Aikens, PA-C 11/29/15 0105    Shaune Pollackameron Isaacs, MD 11/29/15 [phone]

## 2015-11-28 NOTE — Discharge Instructions (Signed)
Your x-ray of your ankle today showed some arthritis but was otherwise normal. We gave you an ankle brace. As we discussed, it is possible you strained/sprained your achilles tendon but it is unlikely your tore it completely. Please follow up with Dr. Lajoyce Corners as soon as possible. Take medication as prescribed as needed for pain.

## 2015-11-28 NOTE — ED Triage Notes (Signed)
Pt presents to the ed with complaints of pain in his right foot after stepping up on a step yesterday, he felt a pop  And then it started hurting, he feels a stabbing pain that is worse with flexion of the ankle and it radiates into his knee and calf

## 2015-12-07 ENCOUNTER — Emergency Department (HOSPITAL_COMMUNITY)
Admission: EM | Admit: 2015-12-07 | Discharge: 2015-12-07 | Disposition: A | Discharge status: Home / Self Care | Payer: Medicaid Other | Attending: Emergency Medicine | Admitting: Emergency Medicine

## 2015-12-07 ENCOUNTER — Emergency Department (HOSPITAL_COMMUNITY): Payer: Medicaid Other

## 2015-12-07 DIAGNOSIS — S0083XA Contusion of other part of head, initial encounter: Secondary | ICD-10-CM | POA: Diagnosis not present

## 2015-12-07 DIAGNOSIS — Y939 Activity, unspecified: Secondary | ICD-10-CM | POA: Insufficient documentation

## 2015-12-07 DIAGNOSIS — Z7982 Long term (current) use of aspirin: Secondary | ICD-10-CM | POA: Diagnosis not present

## 2015-12-07 DIAGNOSIS — S0993XA Unspecified injury of face, initial encounter: Secondary | ICD-10-CM | POA: Diagnosis present

## 2015-12-07 DIAGNOSIS — M79661 Pain in right lower leg: Secondary | ICD-10-CM | POA: Insufficient documentation

## 2015-12-07 DIAGNOSIS — Y929 Unspecified place or not applicable: Secondary | ICD-10-CM | POA: Diagnosis not present

## 2015-12-07 DIAGNOSIS — M25511 Pain in right shoulder: Secondary | ICD-10-CM | POA: Insufficient documentation

## 2015-12-07 DIAGNOSIS — Y999 Unspecified external cause status: Secondary | ICD-10-CM | POA: Diagnosis not present

## 2015-12-07 DIAGNOSIS — T07XXXA Unspecified multiple injuries, initial encounter: Secondary | ICD-10-CM

## 2015-12-07 MED ORDER — IBUPROFEN 600 MG PO TABS: 600.0000 mg | ORAL_TABLET | Freq: Four times a day (QID) | ORAL | 0 refills | Status: DC | PRN
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
## 2015-12-07 NOTE — Discharge Instructions (Signed)
Return to the ED with any concerns including vomiting, seizure activity, not able to  bear weight, changes in vision, or any other alarming symptoms
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
## 2015-12-07 NOTE — ED Notes (Signed)
Declined W/C at D/C and was escorted to lobby by RN. 

## 2015-12-12 ENCOUNTER — Emergency Department (HOSPITAL_COMMUNITY)
Admission: EM | Admit: 2015-12-12 | Discharge: 2015-12-12 | Disposition: A | Discharge status: Home / Self Care | Payer: Medicaid Other | Attending: Emergency Medicine | Admitting: Emergency Medicine

## 2015-12-12 ENCOUNTER — Encounter (HOSPITAL_COMMUNITY): Payer: Self-pay

## 2015-12-12 DIAGNOSIS — S0993XD Unspecified injury of face, subsequent encounter: Secondary | ICD-10-CM | POA: Diagnosis present

## 2015-12-12 DIAGNOSIS — Z7901 Long term (current) use of anticoagulants: Secondary | ICD-10-CM | POA: Diagnosis not present

## 2015-12-12 DIAGNOSIS — Z7982 Long term (current) use of aspirin: Secondary | ICD-10-CM | POA: Insufficient documentation

## 2015-12-12 MED ORDER — IBUPROFEN 600 MG PO TABS: 600.0000 mg | ORAL_TABLET | Freq: Four times a day (QID) | ORAL | 0 refills | Status: DC | PRN

## 2015-12-12 NOTE — ED Notes (Signed)
Declined W/C at D/C and was escorted to lobby by RN. 
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
## 2015-12-12 NOTE — Discharge Instructions (Signed)
Take your medications as prescribed as needed for pain relief. I recommend also applying ice to affected area for 15 minutes 3-4 times daily to help with pain and swelling. Follow-up with your family doctor within the next week if her symptoms have not improved. Please return to the Emergency Department if symptoms worsen or new onset of fever, headache, visual changes, lightheadedness, dizziness, nosebleeds, difficulty breathing, neck stiffness.
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
## 2016-01-03 ENCOUNTER — Emergency Department (HOSPITAL_COMMUNITY)
Admission: EM | Admit: 2016-01-03 | Discharge: 2016-01-03 | Disposition: A | Discharge status: Home / Self Care | Payer: Medicaid Other | Attending: Emergency Medicine | Admitting: Emergency Medicine

## 2016-01-03 ENCOUNTER — Encounter (HOSPITAL_COMMUNITY): Payer: Self-pay

## 2016-01-03 ENCOUNTER — Emergency Department (HOSPITAL_COMMUNITY): Payer: Medicaid Other

## 2016-01-03 ENCOUNTER — Ambulatory Visit (INDEPENDENT_AMBULATORY_CARE_PROVIDER_SITE_OTHER): Payer: Medicaid Other | Admitting: Orthopedic Surgery

## 2016-01-03 DIAGNOSIS — R05 Cough: Secondary | ICD-10-CM | POA: Diagnosis present

## 2016-01-03 DIAGNOSIS — Z7982 Long term (current) use of aspirin: Secondary | ICD-10-CM | POA: Diagnosis not present

## 2016-01-03 DIAGNOSIS — B9789 Other viral agents as the cause of diseases classified elsewhere: Secondary | ICD-10-CM

## 2016-01-03 DIAGNOSIS — J069 Acute upper respiratory infection, unspecified: Secondary | ICD-10-CM | POA: Insufficient documentation

## 2016-01-03 DIAGNOSIS — M722 Plantar fascial fibromatosis: Secondary | ICD-10-CM

## 2016-01-03 DIAGNOSIS — Z79899 Other long term (current) drug therapy: Secondary | ICD-10-CM | POA: Diagnosis not present

## 2016-01-03 DIAGNOSIS — R0781 Pleurodynia: Secondary | ICD-10-CM | POA: Diagnosis not present

## 2016-01-03 DIAGNOSIS — M6701 Short Achilles tendon (acquired), right ankle: Secondary | ICD-10-CM

## 2016-01-03 LAB — CBC WITH DIFFERENTIAL/PLATELET
Basophils Absolute: 0.1 10*3/uL (ref 0.0–0.1)
Basophils Relative: 1 %
EOS PCT: 2 %
Eosinophils Absolute: 0.1 10*3/uL (ref 0.0–0.7)
HEMATOCRIT: 40 % (ref 39.0–52.0)
Hemoglobin: 13.5 g/dL (ref 13.0–17.0)
LYMPHS ABS: 2.4 10*3/uL (ref 0.7–4.0)
LYMPHS PCT: 38 %
MCH: 28.4 pg (ref 26.0–34.0)
MCHC: 33.8 g/dL (ref 30.0–36.0)
MCV: 84 fL (ref 78.0–100.0)
MONO ABS: 0.4 10*3/uL (ref 0.1–1.0)
MONOS PCT: 7 %
NEUTROS ABS: 3.5 10*3/uL (ref 1.7–7.7)
Neutrophils Relative %: 52 %
PLATELETS: 258 10*3/uL (ref 150–400)
RBC: 4.76 MIL/uL (ref 4.22–5.81)
RDW: 13.5 % (ref 11.5–15.5)
WBC: 6.5 10*3/uL (ref 4.0–10.5)

## 2016-01-03 LAB — BASIC METABOLIC PANEL
ANION GAP: 7 (ref 5–15)
BUN: 5 mg/dL — AB (ref 6–20)
CALCIUM: 9.2 mg/dL (ref 8.9–10.3)
CO2: 25 mmol/L (ref 22–32)
CREATININE: 1.29 mg/dL — AB (ref 0.61–1.24)
Chloride: 109 mmol/L (ref 101–111)
GFR calc Af Amer: >60 mL/min (ref >60)
GFR calc non Af Amer: >60 mL/min (ref >60)
GLUCOSE: 109 mg/dL — AB (ref 65–99)
Potassium: 3.9 mmol/L (ref 3.5–5.1)
Sodium: 141 mmol/L (ref 135–145)

## 2016-01-03 MED ORDER — GUAIFENESIN-CODEINE 100-10 MG/5ML PO SOLN: 10.0000 mL | Freq: Three times a day (TID) | ORAL | 0 refills | Status: DC | PRN

## 2016-01-03 NOTE — ED Notes (Signed)
Went to take patient back to triage and he advised he needed to go to the bathroom first.

## 2016-01-03 NOTE — ED Provider Notes (Signed)
[facility]PT Provider Note   CSN: [id number] Arrival date & time: 01/03/16  1315     History   Chief Complaint Chief Complaint  Patient presents with  . Cough  . rib cage pain    HPI Justin [name] is a 50 y.o. male.  The history is provided by the patient. No language interpreter was used.  Cough  This is a new problem. The current episode started more than 2 days ago. The problem occurs every few hours. The problem has been gradually worsening. The cough is non-productive. Associated symptoms include chills, myalgias and shortness of breath. He has tried nothing for the symptoms. He is not a smoker. His past medical history is significant for pneumonia.    Past Medical History:  Diagnosis Date  . Achilles rupture, left   . Bipolar 1 disorder (HCC)   . Dental caries    periodontitis  . Pneumonia   . Pulmonary embolism (HCC)   . Sleep apnea    wears CPAP  . Subdural hematoma [facility])     Patient Active Problem List   Diagnosis Date Noted  . Obstructive sleep apnea on CPAP 03/07/2015  . Post-operative state 03/07/2015  . Achilles rupture, left 10/21/2014  . OSA (obstructive sleep apnea) 04/23/2013  . Bipolar disorder, unspecified 01/07/2013  . Sinus tachycardia 01/07/2013  . Headache(784.0) 01/07/2013  . SDH (subdural hematoma) (HCC) 01/04/2013  . H/O PE 01/04/2013  . Chronic anticoagulation 01/04/2013  . 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  . Acute sinusitis 01/04/2013    Past Surgical History:  Procedure Laterality Date  . ACHILLES TENDON SURGERY Left 10/21/2014   Procedure: Left Achilles Reconstruction;  Surgeon: Nadara Mustard, MD;  Location: [facility] OR;  Service: Orthopedics;  Laterality: Left;  . APPENDECTOMY    . CRANIOTOMY N/A 01/19/2013   Procedure: CRANIOTOMY HEMATOMA EVACUATION SUBDURAL;  Surgeon: Hewitt Shorts, MD;  Location: [facility]S;  Service: Neurosurgery;  Laterality: N/A;  . CYSTECTOMY     right head  . ELBOW SURGERY     right  . FRACTURE SURGERY     finger  . MULTIPLE EXTRACTIONS WITH ALVEOLOPLASTY N/A 03/07/2015   Procedure: MULTIPLE EXTRACTION WITH ALVEOLOPLASTY;  Surgeon: [name], DDS;  Location: [facility] OR;  Service: Oral Surgery;  Laterality: N/A;       Home Medications    Prior to Admission medications   Medication Sig Start Date End Date Taking? Authorizing Provider  acetaminophen (TYLENOL) 500 MG tablet Take 1 tablet (500 mg total) by mouth every 6 (six) hours as needed. Patient not taking: Reported on 10/06/2015 08/08/15   [name], PA-C  aspirin EC 325 MG tablet Take 1 tablet (325 mg total) by mouth daily. Patient not taking: Reported on 10/06/2015 10/21/14   Nadara Mustard, MD  fluticasone [facility]) 50 MCG/ACT nasal spray Place 2 sprays into both nostrils daily. Patient not taking: Reported on 10/06/2015 04/06/15   [name], PA-C  gabapentin (NEURONTIN) 300 MG capsule Take 1 capsule (300 mg total) by mouth 2 (two) times daily. 09/17/15   [name], PA-C  HYDROcodone-acetaminophen (NORCO/VICODIN) 5-325 MG tablet Take 1-2 tablets by mouth every 6 hours as needed for pain and/or cough. Patient not taking: Reported on 10/06/2015 03/31/15   [name], PA-C  HYDROcodone-acetaminophen (NORCO/VICODIN) 5-325 MG tablet Take 1 tablet by mouth every 4 (four) hours as needed for severe pain. 11/28/15   [name], PA-C  ibuprofen (ADVIL,MOTRIN) 600 MG tablet Take 1 tablet (600 mg total)  by mouth every 6 (six) hours as needed. 12/07/15   [name], MD  ibuprofen (ADVIL,MOTRIN) 600 MG tablet Take 1 tablet (600 mg total) by mouth every 6 (six) hours as needed. 12/12/15   [name], PA-C  lithium 600 MG capsule Take 600 mg by mouth at bedtime.    Historical Provider, MD  mirtazapine (REMERON) 45 MG tablet Take 45 mg by mouth at bedtime.    Historical Provider, MD  oxyCODONE-acetaminophen (PERCOCET) 10-325 MG tablet Take 1-2 tablets by mouth every 4 (four) hours as needed for  pain. Patient not taking: Reported on 10/06/2015 03/08/15   [name], DDS  predniSONE (DELTASONE) 20 MG tablet Take 2 tablets (40 mg total) by mouth daily. 10/06/15   Ace GinsSerena Y Sam, PA-C  QUEtiapine (SEROQUEL XR) 400 MG 24 hr tablet Take 800 mg by mouth at bedtime.    Historical Provider, MD  traMADol (ULTRAM) 50 MG tablet Take 1 tablet (50 mg total) by mouth every 6 (six) hours as needed. Patient not taking: Reported on 10/06/2015 08/19/15   Santiago GladHeather Laisure, PA-C    Family History Family History  Problem Relation Age of Onset  . Hypertension Mother   . Multiple sclerosis Sister     Social History Social History  Substance Use Topics  . Smoking status: Never Smoker  . Smokeless tobacco: Never Used  . Alcohol use No     Allergies   Review of patient's allergies indicates no known allergies.   Review of Systems Review of Systems  Constitutional: Positive for chills.  Respiratory: Positive for cough and shortness of breath.   Gastrointestinal: Negative for abdominal pain, nausea and vomiting.  Musculoskeletal: Positive for myalgias.  All other systems reviewed and are negative.    Physical Exam Updated Vital Signs BP 124/90   Pulse 92   Temp 98.1 F (36.7 C) (Oral)   Resp 18   Ht 5\' 9"  (1.753 m)   Wt 112 kg   SpO2 99%   BMI 36.48 kg/m   Physical Exam  Constitutional: He is oriented to person, place, and time. He appears well-developed and well-nourished.  HENT:  Head: Normocephalic.  Eyes: EOM are normal.  Neck: Neck supple.  Cardiovascular: Normal rate and regular rhythm.   Pulmonary/Chest: Effort normal and breath sounds normal. He exhibits tenderness.    Abdominal: Soft. Bowel sounds are normal.  Musculoskeletal: Normal range of motion. He exhibits no edema.  Neurological: He is alert and oriented to person, place, and time.  Skin: Skin is warm and dry.  Psychiatric: He has a normal mood and affect.  Nursing note and vitals reviewed.    ED  Treatments / Results  Labs (all labs ordered are listed, but only abnormal results are displayed) Labs Reviewed  BASIC METABOLIC PANEL - Abnormal; Notable for the following:       Result Value   Glucose, Bld 109 (*)    BUN 5 (*)    Creatinine, Ser 1.29 (*)    All other components within normal limits  CBC WITH DIFFERENTIAL/PLATELET    EKG  EKG Interpretation None       Radiology Dg Chest 2 View  Result Date: 01/03/2016 CLINICAL DATA:  Productive cough, chest pain and shortness of breath EXAM: CHEST  2 VIEW COMPARISON:  Chest radiograph 10/21/2014 FINDINGS: Cardiomediastinal contours are normal.  There is no pulmonary edema. No pleural effusion or pneumothorax. Mild bibasilar atelectasis. No focal consolidation. IMPRESSION: No active cardiopulmonary disease. Electronically Signed   By: Caryn BeeKevin  Chase Picket M.D.   On: 01/03/2016 13:55    Procedures Procedures (including critical care time)  Medications Ordered in ED Medications - No data to display   Initial Impression / Assessment and Plan / ED Course  I have reviewed the triage vital signs and the nursing notes.  Pertinent labs & imaging results that were available during my care of the patient were reviewed by me and considered in my medical decision making (see chart for details).  Clinical Course  Patient with bilateral lower chest wall/rib pain secondary to coughing.  Pt symptoms consistent with URI. CXR negative for acute infiltrate. Pt will be discharged with symptomatic treatment.  Discussed return precautions.  Pt is hemodynamically stable & in NAD prior to discharge.  Final Clinical Impressions(s) / ED Diagnoses   Final diagnoses:  Viral URI with cough    New Prescriptions Discharge Medication List as of 01/03/2016  3:53 PM    START taking these medications   Details  guaiFENesin-codeine 100-10 MG/5ML syrup Take 10 mLs by mouth 3 (three) times daily as needed for cough., Starting Tue 01/03/2016, Print          [name], NP 01/03/16 2310    Benjiman Core, MD 01/05/16 [phone]

## 2016-01-03 NOTE — ED Triage Notes (Signed)
Patient complains of cough and bilateral rib cage pain with movement x 3 days. States it feels like when he had pneumonia in the past. NAD. Speaking complete sentences, no cough

## 2016-01-13 ENCOUNTER — Emergency Department (HOSPITAL_COMMUNITY): Payer: Medicaid Other

## 2016-01-13 ENCOUNTER — Encounter (HOSPITAL_COMMUNITY): Payer: Self-pay

## 2016-01-13 ENCOUNTER — Emergency Department (HOSPITAL_COMMUNITY)
Admission: EM | Admit: 2016-01-13 | Discharge: 2016-01-13 | Disposition: A | Discharge status: Home / Self Care | Payer: Medicaid Other | Attending: Emergency Medicine | Admitting: Emergency Medicine

## 2016-01-13 DIAGNOSIS — R479 Unspecified speech disturbances: Secondary | ICD-10-CM | POA: Insufficient documentation

## 2016-01-13 DIAGNOSIS — R531 Weakness: Secondary | ICD-10-CM

## 2016-01-13 DIAGNOSIS — Z7982 Long term (current) use of aspirin: Secondary | ICD-10-CM | POA: Diagnosis not present

## 2016-01-13 DIAGNOSIS — I10 Essential (primary) hypertension: Secondary | ICD-10-CM | POA: Diagnosis not present

## 2016-01-13 DIAGNOSIS — Z79899 Other long term (current) drug therapy: Secondary | ICD-10-CM | POA: Insufficient documentation

## 2016-01-13 DIAGNOSIS — Z5181 Encounter for therapeutic drug level monitoring: Secondary | ICD-10-CM | POA: Diagnosis not present

## 2016-01-13 DIAGNOSIS — M6281 Muscle weakness (generalized): Secondary | ICD-10-CM | POA: Insufficient documentation

## 2016-01-13 DIAGNOSIS — R29898 Other symptoms and signs involving the musculoskeletal system: Secondary | ICD-10-CM

## 2016-01-13 LAB — CBC
HEMATOCRIT: 38.3 % — AB (ref 39.0–52.0)
Hemoglobin: 12.8 g/dL — ABNORMAL LOW (ref 13.0–17.0)
MCH: 28.4 pg (ref 26.0–34.0)
MCHC: 33.4 g/dL (ref 30.0–36.0)
MCV: 84.9 fL (ref 78.0–100.0)
PLATELETS: 262 10*3/uL (ref 150–400)
RBC: 4.51 MIL/uL (ref 4.22–5.81)
RDW: 14 % (ref 11.5–15.5)
WBC: 6.8 10*3/uL (ref 4.0–10.5)

## 2016-01-13 LAB — COMPREHENSIVE METABOLIC PANEL
ALBUMIN: 4.4 g/dL (ref 3.5–5.0)
ALT: 32 U/L (ref 17–63)
ANION GAP: 9 (ref 5–15)
AST: 29 U/L (ref 15–41)
Alkaline Phosphatase: 75 U/L (ref 38–126)
BILIRUBIN TOTAL: 0.7 mg/dL (ref 0.3–1.2)
BUN: 6 mg/dL (ref 6–20)
CHLORIDE: 110 mmol/L (ref 101–111)
CO2: 22 mmol/L (ref 22–32)
Calcium: 9 mg/dL (ref 8.9–10.3)
Creatinine, Ser: 1.26 mg/dL — ABNORMAL HIGH (ref 0.61–1.24)
GFR calc Af Amer: >60 mL/min (ref >60)
GLUCOSE: 92 mg/dL (ref 65–99)
POTASSIUM: 3.8 mmol/L (ref 3.5–5.1)
Sodium: 141 mmol/L (ref 135–145)
TOTAL PROTEIN: 7.2 g/dL (ref 6.5–8.1)

## 2016-01-13 LAB — I-STAT CHEM 8, ED
BUN: 8 mg/dL (ref 6–20)
CREATININE: 1.2 mg/dL (ref 0.61–1.24)
Calcium, Ion: 1.08 mmol/L — ABNORMAL LOW (ref 1.15–1.40)
Chloride: 107 mmol/L (ref 101–111)
Glucose, Bld: 89 mg/dL (ref 65–99)
HEMATOCRIT: 40 % (ref 39.0–52.0)
HEMOGLOBIN: 13.6 g/dL (ref 13.0–17.0)
POTASSIUM: 3.7 mmol/L (ref 3.5–5.1)
SODIUM: 143 mmol/L (ref 135–145)
TCO2: 24 mmol/L (ref 0–100)

## 2016-01-13 LAB — PROTIME-INR
INR: 1.02
PROTHROMBIN TIME: 13.4 s (ref 11.4–15.2)

## 2016-01-13 LAB — DIFFERENTIAL
BASOS PCT: 0 %
Basophils Absolute: 0 10*3/uL (ref 0.0–0.1)
EOS ABS: 0.1 10*3/uL (ref 0.0–0.7)
Eosinophils Relative: 2 %
Lymphocytes Relative: 28 %
Lymphs Abs: 1.9 10*3/uL (ref 0.7–4.0)
MONO ABS: 0.6 10*3/uL (ref 0.1–1.0)
MONOS PCT: 8 %
Neutro Abs: 4.2 10*3/uL (ref 1.7–7.7)
Neutrophils Relative %: 62 %

## 2016-01-13 LAB — I-STAT TROPONIN, ED: TROPONIN I, POC: 0 ng/mL (ref 0.00–0.08)

## 2016-01-13 LAB — APTT: APTT: 28 s (ref 24–36)

## 2016-01-13 NOTE — Discharge Instructions (Signed)
Please read attached information. If you experience any new or worsening signs or symptoms please return to the emergency room for evaluation. Please follow-up with your primary care provider or specialist as discussed. Please use medication prescribed only as directed and discontinue taking if you have any concerning signs or symptoms.   °

## 2016-01-13 NOTE — ED Notes (Signed)
Patient taken to MRI

## 2016-01-13 NOTE — ED Provider Notes (Signed)
  Physical Exam  BP (!) 152/107   Pulse 93   Temp 98.5 F (36.9 C)   Resp 20   Ht 5\' 9"  (1.753 m)   Wt 111.6 kg   SpO2 97%   BMI 36.33 kg/m   Physical Exam  ED Course  Procedures  MDM Care of patient taken over from [facility], PA-C at 1630. Patient reported with resolved right-sided weakness and aphasia. Discussed with neurology who recommended MRI and, if negative, could discharge home with outpatient follow-up. MRI resulted and was negative for acute ischemia. On repeat examination he has normal cranial nerve exam without aphasia, full strength of all extremities, intact sensation, and no ataxia on finger-to-nose. Discussed with him the importance of primary care follow-up for stroke prevention, gave resources on outpatient neurology followup. Gave return precautions for worsening symptoms and he expressed understanding. He is in good condition for discharge home.       Preston Fleeting, MD 01/14/16 7124    Melene Plan, DO 01/14/16 [phone]

## 2016-01-13 NOTE — ED Provider Notes (Signed)
[facility]PT Provider Note   CSN: [id number] Arrival date & time: 01/13/16  1246   History   Chief Complaint Chief Complaint  Patient presents with  . Hypertension    HPI Justin [name] is a 50 y.o. male.  HPI   50 year old male presents today with complaints of right-sided weakness. Patient reports that he went to bed last night feeling well, woke approximately 2 a.m. He reports he went downstairs to talk to one of his friends, and developed acute onset right-sided weakness, slurred speech. Patient reports he was having headache over the last several days and continues to have mild headache now. He reports significant dizziness at that time, and was having difficulty ambulating. Patient notes symptoms lasted until this morning. He reports he no longer has weakness or numbness on the right side of his body. Patient reports very mild headache, denies any significant dizziness at this time. Patient denies any trauma, history of stroke, cardiac disease. He denies any drug or alcohol use. Patient reports that he was using Seroquel, but has been taking that nightly for 6 years.  Past Medical History:  Diagnosis Date  . Achilles rupture, left   . Bipolar 1 disorder (HCC)   . Dental caries    periodontitis  . Pneumonia   . Pulmonary embolism (HCC)   . Sleep apnea    wears CPAP  . Subdural hematoma [name])     Patient Active Problem List   Diagnosis Date Noted  . Obstructive sleep apnea on CPAP 03/07/2015  . Post-operative state 03/07/2015  . Achilles rupture, left 10/21/2014  . OSA (obstructive sleep apnea) 04/23/2013  . Bipolar disorder, unspecified 01/07/2013  . Sinus tachycardia 01/07/2013  . Headache(784.0) 01/07/2013  . SDH (subdural hematoma) (HCC) 01/04/2013  . H/O PE 01/04/2013  . Chronic anticoagulation 01/04/2013  . 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  . Acute sinusitis 01/04/2013    Past Surgical History:  Procedure Laterality Date  . ACHILLES  TENDON SURGERY Left 10/21/2014   Procedure: Left Achilles Reconstruction;  Surgeon: Nadara Mustard, MD;  Location: [facility] OR;  Service: Orthopedics;  Laterality: Left;  . APPENDECTOMY    . CRANIOTOMY N/A 01/19/2013   Procedure: CRANIOTOMY HEMATOMA EVACUATION SUBDURAL;  Surgeon: Hewitt Shorts, MD;  Location: [facility] NEUR[facility]S;  Service: Neurosurgery;  Laterality: N/A;  . CYSTECTOMY     right head  . ELBOW SURGERY     right  . FRACTURE SURGERY     finger  . MULTIPLE EXTRACTIONS WITH ALVEOLOPLASTY N/A 03/07/2015   Procedure: MULTIPLE EXTRACTION WITH ALVEOLOPLASTY;  Surgeon: [name], DDS;  Location: [facility] OR;  Service: Oral Surgery;  Laterality: N/A;       Home Medications    Prior to Admission medications   Medication Sig Start Date End Date Taking? Authorizing Provider  acetaminophen (TYLENOL) 500 MG tablet Take 1 tablet (500 mg total) by mouth every 6 (six) hours as needed. Patient not taking: Reported on 10/06/2015 08/08/15   [name], PA-C  aspirin EC 325 MG tablet Take 1 tablet (325 mg total) by mouth daily. Patient not taking: Reported on 10/06/2015 10/21/14   Nadara Mustard, MD  fluticasone [facility]) 50 MCG/ACT nasal spray Place 2 sprays into both nostrils daily. Patient not taking: Reported on 10/06/2015 04/06/15   [name], PA-C  gabapentin (NEURONTIN) 300 MG capsule Take 1 capsule (300 mg total) by mouth 2 (two) times daily. 09/17/15   [name], PA-C  guaiFENesin-codeine 100-10 MG/5ML syrup Take 10 mLs by  mouth 3 (three) times daily as needed for cough. 01/03/16   [name], NP  HYDROcodone-acetaminophen (NORCO/VICODIN) 5-325 MG tablet Take 1-2 tablets by mouth every 6 hours as needed for pain and/or cough. Patient not taking: Reported on 10/06/2015 03/31/15   [name], PA-C  HYDROcodone-acetaminophen (NORCO/VICODIN) 5-325 MG tablet Take 1 tablet by mouth every 4 (four) hours as needed for severe pain. 11/28/15   [name], PA-C  ibuprofen (ADVIL,MOTRIN) 600 MG  tablet Take 1 tablet (600 mg total) by mouth every 6 (six) hours as needed. 12/07/15   [name], MD  ibuprofen (ADVIL,MOTRIN) 600 MG tablet Take 1 tablet (600 mg total) by mouth every 6 (six) hours as needed. 12/12/15   [name], PA-C  lithium 600 MG capsule Take 600 mg by mouth at bedtime.    Historical Provider, MD  mirtazapine (REMERON) 45 MG tablet Take 45 mg by mouth at bedtime.    Historical Provider, MD  oxyCODONE-acetaminophen (PERCOCET) 10-325 MG tablet Take 1-2 tablets by mouth every 4 (four) hours as needed for pain. Patient not taking: Reported on 10/06/2015 03/08/15   [name], DDS  predniSONE (DELTASONE) 20 MG tablet Take 2 tablets (40 mg total) by mouth daily. 10/06/15   [name], PA-C  QUEtiapine (SEROQUEL XR) 400 MG 24 hr tablet Take 800 mg by mouth at bedtime.    Historical Provider, MD  traMADol (ULTRAM) 50 MG tablet Take 1 tablet (50 mg total) by mouth every 6 (six) hours as needed. Patient not taking: Reported on 10/06/2015 08/19/15   [name], PA-C    Family History Family History  Problem Relation Age of Onset  . Hypertension Mother   . Multiple sclerosis Sister     Social History Social History  Substance Use Topics  . Smoking status: Never Smoker  . Smokeless tobacco: Never Used  . Alcohol use No     Allergies   Review of patient's allergies indicates no known allergies.   Review of Systems Review of Systems  All other systems reviewed and are negative.   Physical Exam Updated Vital Signs BP 130/95   Pulse 85   Temp 98.5 F (36.9 C) (Oral)   Resp 17   Ht 5\' 9"  (1.753 m)   Wt 111.6 kg   SpO2 97%   BMI 36.33 kg/m   Physical Exam  Constitutional: He is oriented to person, place, and time. He appears well-developed and well-nourished.  HENT:  Head: Normocephalic and atraumatic.  Eyes: Conjunctivae are normal. Pupils are equal, round, and reactive to light. Right eye exhibits no discharge. Left eye exhibits no  discharge. No scleral icterus.  Neck: Normal range of motion. No JVD present. No tracheal deviation present.  Pulmonary/Chest: Effort normal. No stridor.  Neurological: He is alert and oriented to person, place, and time. He has normal strength. No cranial nerve deficit or sensory deficit. Coordination normal. GCS eye subscore is 4. GCS verbal subscore is 5. GCS motor subscore is 6.  Psychiatric: He has a normal mood and affect. His behavior is normal. Judgment and thought content normal.  Nursing note and vitals reviewed.   ED Treatments / Results  Labs (all labs ordered are listed, but only abnormal results are displayed) Labs Reviewed  CBC - Abnormal; Notable for the following:       Result Value   Hemoglobin 12.8 (*)    HCT 38.3 (*)    All other components within normal limits  COMPREHENSIVE METABOLIC PANEL - Abnormal;  Notable for the following:    Creatinine, Ser 1.26 (*)    All other components within normal limits  I-STAT CHEM 8, ED - Abnormal; Notable for the following:    Calcium, Ion 1.08 (*)    All other components within normal limits  PROTIME-INR  APTT  DIFFERENTIAL  I-STAT TROPOININ, ED  CBG MONITORING, ED    EKG  EKG Interpretation  Date/Time:  Friday January 13 2016 13:26:01 EDT Ventricular Rate:  99 PR Interval:  166 QRS Duration: 82 QT Interval:  358 QTC Calculation: 459 R Axis:   -6 Text Interpretation:  Normal sinus rhythm Nonspecific T wave abnormality Abnormal ECG No significant change since last tracing Confirmed by FLOYD MD, Reuel BoomANIEL [id number]) on 01/13/2016 2:07:50 PM       Radiology Ct Head Wo Contrast  Result Date: 01/13/2016 CLINICAL DATA:  Pt having posterior headache, trouble getting words out, and right sided weakness since last night. EXAM: CT HEAD WITHOUT CONTRAST TECHNIQUE: Contiguous axial images were obtained from the base of the skull through the vertex without intravenous contrast. COMPARISON:  04/04/2014 FINDINGS: Brain: No evidence  of acute infarction, hemorrhage, hydrocephalus, extra-axial collection or mass lesion/mass effect. Vascular: No hyperdense vessel or unexpected calcification. Skull: Normal. Negative for fracture or focal lesion. Sinuses/Orbits: Clear visualized sinuses. Globes and orbits are unremarkable. Other: None. IMPRESSION: 1. No acute intracranial abnormalities. Electronically Signed   By: Amie Portlandavid  Ormond M.D.   On: 01/13/2016 13:53    Procedures Procedures (including critical care time)  Medications Ordered in ED Medications - No data to display   Initial Impression / Assessment and Plan / ED Course  I have reviewed the triage vital signs and the nursing notes.  Pertinent labs & imaging results that were available during my care of the patient were reviewed by me and considered in my medical decision making (see chart for details).  Clinical Course     Final Clinical Impressions(s) / ED Diagnoses   Final diagnoses:  Right sided weakness   Labs:  Imaging:  Consults:  Therapeutics:  Discharge Meds:   Assessment/Plan:  50 year old male presents today with complaints of neurological deficits. Patient is asymptomatic at the time my evaluation, no history of stroke. Patient case discussed with neurology Dr. Roxy Mannsster who agreed that an MRI would be appropriate in this setting, if negative he would be safe for discharge home.  4:46 PM  patient continues to be asymptomatic. Patient will be signed off to the oncoming provider pending MRI and disposition home.    New Prescriptions New Prescriptions   No medications on file     Eyvonne MechanicJeffrey Iszabella Hebenstreit, PA-C 01/13/16 1646    Melene Planan Floyd, DO 01/14/16 [id number]

## 2016-01-13 NOTE — ED Triage Notes (Signed)
Per Pt, Pt is coming from home with complaints of HTN and reaction to sleeping medication. Reports having medication last night and waking up with drool, numbness in the right arm, and having trouble speaking. Pt reports regaining ability to speak and having arm increase in sensation.

## 2016-01-31 ENCOUNTER — Ambulatory Visit (INDEPENDENT_AMBULATORY_CARE_PROVIDER_SITE_OTHER): Payer: Medicaid Other | Admitting: Family

## 2016-01-31 ENCOUNTER — Encounter (INDEPENDENT_AMBULATORY_CARE_PROVIDER_SITE_OTHER): Payer: Self-pay | Admitting: Orthopedic Surgery

## 2016-01-31 VITALS — Ht 69.0 in | Wt 246.0 lb

## 2016-01-31 DIAGNOSIS — M7661 Achilles tendinitis, right leg: Secondary | ICD-10-CM

## 2016-01-31 DIAGNOSIS — S86012D Strain of left Achilles tendon, subsequent encounter: Secondary | ICD-10-CM | POA: Diagnosis not present

## 2016-01-31 MED ORDER — IBUPROFEN 600 MG PO TABS: 600.0000 mg | ORAL_TABLET | Freq: Four times a day (QID) | ORAL | 0 refills | Status: DC | PRN

## 2016-01-31 NOTE — Progress Notes (Signed)
Office Visit Note   Patient: Justin [name]           Date of Birth: August 19, 1965           MRN: [id number] Visit Date: 01/31/2016              Requested by: [facility] [address] [place] [id number] PCP: [facility]   Assessment & Plan: Visit Diagnoses:  1. Achilles tendinitis, right leg   2. Rupture of left Achilles tendon, subsequent encounter     Plan: I have provided him with a prescription for ibuprofen 600 mg for pain. He will use nitroglycerin patches on the right heel. Will continue working on heel cord stretching bilaterally.  Follow-Up Instructions: Return in about 4 weeks (around 02/28/2016).   Orders:  No orders of the defined types were placed in this encounter.  Meds ordered this encounter  Medications  . ibuprofen (ADVIL,MOTRIN) 600 MG tablet    Sig: Take 1 tablet (600 mg total) by mouth every 6 (six) hours as needed.    Dispense:  60 tablet    Refill:  0      Procedures: No procedures performed   Clinical Data: No additional findings.   Subjective: Chief Complaint  Patient presents with  . Right Leg - Follow-up    Right gastrocnemius recession 11/01/2015 13 weeks post op    Patient is status post right gastroc recession 11/01/2015. He is 13 weeks post op. He is having persistent pain plantar midfoot and has shooting pain up posterior calf. He states he has no relief with the neurontin 300mg . He still feels about the same. He feels sometimes the nitroglycerin patch sometimes it does not.  he has been using a nitroglycerin patch on the left achilles. This has been helpful. States the pain in the left achilles is nearly resolved. Is now complaining of insertional right achilles pain. This is worse over the course of the day. Has not been really working on stretching. States he does walk to the store daily and stretches the heel as he walks.  Complains of some plantar pain in right foot as well. This comes on at night in bed.    Review of Systems  Constitutional: Negative for chills and fever.  Musculoskeletal: Positive for myalgias.  All other systems reviewed and are negative.    Objective: Vital Signs: Ht 5\' 9"  (1.753 m)   Wt 246 lb (111.6 kg)   BMI 36.33 kg/m   Physical Exam Physical Exam  Constitutional: He is oriented to person, place, and time. He appears well-developed and well-nourished.  Pulmonary/Chest: Effort normal.  Neurological: He is alert and oriented to person, place, and time.  Psychiatric: He has a normal mood and affect.  Nursing note reviewed.  Him insertion of Achilles on the right is tender to palpation. There are no palpable cords or defects. Left Achilles is nontender to palpation. No defects. Has heel cord tightness bilaterally with dorsiflexion just beyond neutral. Right foot: Origin of plantar fascia is nontender. Metatarsal heads nontender. No pain with resisted eversion or inversion.   Ortho Exam  Specialty Comments:  No specialty comments available.  Imaging: No results found.   PMFS History: Patient Active Problem List   Diagnosis Date Noted  . Achilles tendinitis, right leg 01/31/2016  . Obstructive sleep apnea on CPAP 03/07/2015  . Post-operative state 03/07/2015  . Achilles rupture, left 10/21/2014  . OSA (obstructive sleep apnea) 04/23/2013  . Bipolar disorder, unspecified 01/07/2013  .  Sinus tachycardia 01/07/2013  . Headache(784.0) 01/07/2013  . SDH (subdural hematoma) (HCC) 01/04/2013  . H/O PE 01/04/2013  . Chronic anticoagulation 01/04/2013  . 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  . Acute sinusitis 01/04/2013   Past Medical History:  Diagnosis Date  . Achilles rupture, left   . Bipolar 1 disorder (HCC)   . Dental caries    periodontitis  . Pneumonia   . Pulmonary embolism (HCC)   . Sleep apnea    wears CPAP  . Subdural hematoma (HCC)     Family History  Problem Relation Age of Onset  . Hypertension Mother   . Multiple  sclerosis Sister     Past Surgical History:  Procedure Laterality Date  . ACHILLES TENDON SURGERY Left 10/21/2014   Procedure: Left Achilles Reconstruction;  Surgeon: Nadara Mustard, MD;  Location: [facility] OR;  Service: Orthopedics;  Laterality: Left;  . APPENDECTOMY    . CRANIOTOMY N/A 01/19/2013   Procedure: CRANIOTOMY HEMATOMA EVACUATION SUBDURAL;  Surgeon: Hewitt Shorts, MD;  Location[facility];  Service: Neurosurgery;  Laterality: N/A;  . CYSTECTOMY     right head  . ELBOW SURGERY     right  . FRACTURE SURGERY     finger  . MULTIPLE EXTRACTIONS WITH ALVEOLOPLASTY N/A 03/07/2015   Procedure: MULTIPLE EXTRACTION WITH ALVEOLOPLASTY;  Surgeon: [name], DDS;  Location: [facility] OR;  Service: Oral Surgery;  Laterality: N/A;   Social History   Occupational History  . disability    Social History Main Topics  . Smoking status: Never Smoker  . Smokeless tobacco: Never Used  . Alcohol use No  . Drug use: No  . Sexual activity: Not on file

## 2016-02-14 ENCOUNTER — Emergency Department (HOSPITAL_COMMUNITY)
Admission: EM | Admit: 2016-02-14 | Discharge: 2016-02-14 | Disposition: A | Discharge status: Home / Self Care | Payer: Medicaid Other | Attending: Emergency Medicine | Admitting: Emergency Medicine

## 2016-02-14 ENCOUNTER — Encounter (HOSPITAL_COMMUNITY): Payer: Self-pay

## 2016-02-14 DIAGNOSIS — Z79899 Other long term (current) drug therapy: Secondary | ICD-10-CM | POA: Insufficient documentation

## 2016-02-14 DIAGNOSIS — M7661 Achilles tendinitis, right leg: Secondary | ICD-10-CM | POA: Diagnosis not present

## 2016-02-14 DIAGNOSIS — Z7982 Long term (current) use of aspirin: Secondary | ICD-10-CM | POA: Insufficient documentation

## 2016-02-14 DIAGNOSIS — M79604 Pain in right leg: Secondary | ICD-10-CM | POA: Diagnosis present

## 2016-02-14 MED ORDER — IBUPROFEN 600 MG PO TABS: 600.0000 mg | ORAL_TABLET | Freq: Four times a day (QID) | ORAL | 0 refills | Status: DC | PRN

## 2016-02-14 MED ORDER — IBUPROFEN 400 MG PO TABS
800.0000 mg | ORAL_TABLET | Freq: Once | ORAL | Status: AC
  Administered 2016-02-14: 800 mg via ORAL
  Filled 2016-02-14: qty 2

## 2016-02-14 NOTE — ED Notes (Signed)
Pt verbalized understanding of follow up. Pt stated he thinks he "will use the last doctor that did his surgery." NAD. VSS. Ambulatory at DC.

## 2016-02-14 NOTE — ED Notes (Signed)
Registration at bedside.

## 2016-02-14 NOTE — ED Triage Notes (Signed)
Patient complains of right lower leg pain x 2 days, states that the pain is from the knee down. Denies trauma, no swelling, no redness. Alert and oriented

## 2016-02-14 NOTE — ED Provider Notes (Signed)
[facility] Provider Note   CSN: [id number] Arrival date & time: 02/14/16  1244   By signing my name below, I, Freida Busman, attest that this documentation has been prepared under the direction and in the presence of Raeford Razor, MD . Electronically Signed: Freida Busman, Scribe. 02/14/2016. 3:56 PM.  History   Chief Complaint Chief Complaint  Patient presents with  . Leg Pain    The history is provided by the patient. No language interpreter was used.    HPI Comments:  Justin [name] is a 50 y.o. male who presents to the Emergency Department complaining of constant RLE pain x 1-2 weeks. Pt states his pain shoots up from the foot to the calf ambulating. He denies recent injury and states he is not very active.  No alleviating factors noted. Pt has no other acute complaints or symptoms at this time.    Past Medical History:  Diagnosis Date  . Achilles rupture, left   . Bipolar 1 disorder (HCC)   . Dental caries    periodontitis  . Pneumonia   . Pulmonary embolism (HCC)   . Sleep apnea    wears CPAP  . Subdural hematoma [facility])     Patient Active Problem List   Diagnosis Date Noted  . Achilles tendinitis, right leg 01/31/2016  . Obstructive sleep apnea on CPAP 03/07/2015  . Post-operative state 03/07/2015  . Achilles rupture, left 10/21/2014  . OSA (obstructive sleep apnea) 04/23/2013  . Bipolar disorder, unspecified 01/07/2013  . Sinus tachycardia 01/07/2013  . Headache(784.0) 01/07/2013  . SDH (subdural hematoma) (HCC) 01/04/2013  . H/O PE 01/04/2013  . Chronic anticoagulation 01/04/2013  . 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  . Acute sinusitis 01/04/2013    Past Surgical History:  Procedure Laterality Date  . ACHILLES TENDON SURGERY Left 10/21/2014   Procedure: Left Achilles Reconstruction;  Surgeon: Nadara Mustard, MD;  Location: [facility] OR;  Service: Orthopedics;  Laterality: Left;  . APPENDECTOMY    . CRANIOTOMY N/A 01/19/2013   Procedure:  CRANIOTOMY HEMATOMA EVACUATION SUBDURAL;  Surgeon: Hewitt Shorts, MD;  [facility];  Service: Neurosurgery;  Laterality: N/A;  . CYSTECTOMY     right head  . ELBOW SURGERY     right  . FRACTURE SURGERY     finger  . MULTIPLE EXTRACTIONS WITH ALVEOLOPLASTY N/A 03/07/2015   Procedure: MULTIPLE EXTRACTION WITH ALVEOLOPLASTY;  Surgeon: [name], DDS;  Location: [facility] OR;  Service: Oral Surgery;  Laterality: N/A;      Home Medications    Prior to Admission medications   Medication Sig Start Date End Date Taking? Authorizing Provider  acetaminophen (TYLENOL) 500 MG tablet Take 1 tablet (500 mg total) by mouth every 6 (six) hours as needed. Patient not taking: Reported on 01/31/2016 08/08/15   Rolm Gala Barrett, PA-C  AFLURIA PRESERVATIVE FREE 0.5 ML SUSY inject 0.5 milliliter intramuscularly 01/11/16   Historical Provider, MD  aspirin EC 325 MG tablet Take 1 tablet (325 mg total) by mouth daily. Patient not taking: Reported on 01/31/2016 10/21/14   Nadara Mustard, MD  fluticasone [facility]) 50 MCG/ACT nasal spray Place 2 sprays into both nostrils daily. Patient not taking: Reported on 01/31/2016 04/06/15   [name], PA-C  gabapentin (NEURONTIN) 300 MG capsule Take 1 capsule (300 mg total) by mouth 2 (two) times daily. 09/17/15   [name], PA-C  guaiFENesin-codeine 100-10 MG/5ML syrup Take 10 mLs by mouth 3 (three) times daily as needed for cough. Patient not taking:  Reported on 01/31/2016 01/03/16   [name], NP  HYDROcodone-acetaminophen (NORCO/VICODIN) 5-325 MG tablet Take 1-2 tablets by mouth every 6 hours as needed for pain and/or cough. Patient not taking: Reported on 01/31/2016 03/31/15   [name], PA-C  HYDROcodone-acetaminophen (NORCO/VICODIN) 5-325 MG tablet Take 1 tablet by mouth every 4 (four) hours as needed for severe pain. Patient not taking: Reported on 01/31/2016 11/28/15   [name], PA-C  ibuprofen (ADVIL,MOTRIN) 600 MG tablet Take 1 tablet  (600 mg total) by mouth every 6 (six) hours as needed. Patient not taking: Reported on 01/31/2016 12/07/15   [name], MD  ibuprofen (ADVIL,MOTRIN) 600 MG tablet Take 1 tablet (600 mg total) by mouth every 6 (six) hours as needed. 01/31/16   Berton Lan, NP  lithium 600 MG capsule Take 600 mg by mouth at bedtime.    Historical Provider, MD  lithium carbonate (ESKALITH) 450 MG CR tablet Take 450 mg by mouth every morning. 01/11/16   Historical Provider, MD  mirtazapine (REMERON) 45 MG tablet Take 45 mg by mouth at bedtime.    Historical Provider, MD  nitroGLYCERIN (NITRODUR - DOSED IN MG/24 HR) 0.2 mg/hr patch  01/11/16   Historical Provider, MD  oxyCODONE-acetaminophen (PERCOCET) 10-325 MG tablet Take 1-2 tablets by mouth every 4 (four) hours as needed for pain. Patient not taking: Reported on 01/31/2016 03/08/15   [name], DDS  predniSONE (DELTASONE) 20 MG tablet Take 2 tablets (40 mg total) by mouth daily. Patient not taking: Reported on 01/31/2016 10/06/15   [name], PA-C  QUEtiapine (SEROQUEL XR) 400 MG 24 hr tablet Take 800 mg by mouth at bedtime.    Historical Provider, MD  tiZANidine (ZANAFLEX) 4 MG tablet Take 4 mg by mouth at bedtime as needed (for achilles).  01/11/16   Historical Provider, MD  traMADol (ULTRAM) 50 MG tablet Take 1 tablet (50 mg total) by mouth every 6 (six) hours as needed. Patient not taking: Reported on 01/31/2016 08/19/15   [name], PA-C    Family History Family History  Problem Relation Age of Onset  . Hypertension Mother   . Multiple sclerosis Sister     Social History Social History  Substance Use Topics  . Smoking status: Never Smoker  . Smokeless tobacco: Never Used  . Alcohol use No     Allergies   Patient has no known allergies.   Review of Systems Review of Systems  Musculoskeletal: Positive for arthralgias and myalgias.  Neurological: Negative for weakness and numbness.     Physical Exam Updated Vital  Signs BP 131/89 (BP Location: Right Arm)   Pulse 84   Temp 98 F (36.7 C)   Resp 18   SpO2 100%   Physical Exam  Constitutional: He is oriented to person, place, and time. He appears well-developed and well-nourished.  HENT:  Head: Normocephalic and atraumatic.  Eyes: EOM are normal.  Neck: Normal range of motion.  Cardiovascular: Normal rate, regular rhythm, normal heart sounds and intact distal pulses.   Pulmonary/Chest: Effort normal and breath sounds normal. No respiratory distress.  Abdominal: Soft. He exhibits no distension. There is no tenderness.  Musculoskeletal:  Tenderness along right achilles tendon increased pain with forced plantar flexion; none with dorsiflexion No swelling Easily palpale dp pulse  Foot is warm   Neurological: He is alert and oriented to person, place, and time.  Skin: Skin is warm and dry.  Psychiatric: He has a normal mood and affect. Judgment normal.  Nursing note and vitals reviewed.    ED Treatments / Results  DIAGNOSTIC STUDIES:  Oxygen Saturation is 100% on RA, normal by my interpretation.    COORDINATION OF CARE:  3:52 PM Discussed treatment plan with pt at bedside and pt agreed to plan.  Labs (all labs ordered are listed, but only abnormal results are displayed) Labs Reviewed - No data to display  EKG  EKG Interpretation None       Radiology No results found.  Procedures Procedures (including critical care time)  Medications Ordered in ED Medications - No data to display   Initial Impression / Assessment and Plan / ED Course  I have reviewed the triage vital signs and the nursing notes.  Pertinent labs & imaging results that were available during my care of the patient were reviewed by me and considered in my medical decision making (see chart for details).  Clinical Course     50 year old male with what I suspect is tendinitis of the right Achilles. Doubt DVT. Neurovascularly intact. Symptomatic tx.    Final Clinical Impressions(s) / ED Diagnoses   Final diagnoses:  Tendonitis, Achilles, right    New Prescriptions New Prescriptions   No medications on file    I personally preformed the services scribed in my presence. The recorded information has been reviewed is accurate. Raeford RazorStephen Keylen Uzelac, MD.    Raeford RazorStephen Khrystyna Schwalm, MD 02/23/16 [phone]

## 2016-02-22 ENCOUNTER — Ambulatory Visit (INDEPENDENT_AMBULATORY_CARE_PROVIDER_SITE_OTHER): Payer: Medicaid Other | Admitting: Family

## 2016-02-22 ENCOUNTER — Encounter (INDEPENDENT_AMBULATORY_CARE_PROVIDER_SITE_OTHER): Payer: Self-pay | Admitting: Orthopedic Surgery

## 2016-02-22 VITALS — Ht 69.0 in | Wt 246.0 lb

## 2016-02-22 DIAGNOSIS — M7661 Achilles tendinitis, right leg: Secondary | ICD-10-CM | POA: Diagnosis not present

## 2016-02-22 NOTE — Progress Notes (Signed)
Office Visit Note   Patient: Justin [name]           Date of Birth: 7/1[name]8/1967           MRN: [id number] Visit Date: 02/22/2016              Requested by: [facility] [address] [place], [place] [id number] PCP: [facility]   Assessment & Plan: Visit Diagnoses:  1. Achilles tendinitis of right lower extremity     Plan: Has not been successful with home therapy. We will get him set up for outpatient physical therapy through [facility]. Have provided him with a 9/16 heel lift for wear. Encouraged continued heel cord stretching and NSAIDs bid for inflammation.   Follow-Up Instructions: Return in about 4 weeks (around 03/21/2016).   Orders:  Orders Placed This Encounter  Procedures  . Ambulatory referral to Physical Therapy   No orders of the defined types were placed in this encounter.     Procedures: No procedures performed   Clinical Data: No additional findings.   Subjective: Chief Complaint  Patient presents with  . Right Foot - Follow-up    Right gastrocnemius recession 11/01/2015 16 weeks post op       Patient is a 50 year old gentleman seen in follow up for right achilles pain. States that his right leg is still hurting and he "cannot take it any more." He is 16 week s/p a gastroc recession and states that the pain has just never gotten better. He takes Ibuprofen three times daily and says that this is not helpful. He says that his achilles is very tender and wants to know if there is anything that can be placed in his shoe to off load pressure.  Has not been working on heel cord stretching. Does state that he walks to the store daily. After 20 minutes of walking the achilles begins to ache.     Review of Systems  Constitutional: Negative for chills and fever.  Musculoskeletal: Positive for arthralgias and gait problem.     Objective: Vital Signs: Ht 5\' 9"  (1.753 m)   Wt 246 lb (111.6 kg)   BMI 36.33 kg/m   Physical Exam  Constitutional:  He is oriented to person, place, and time. He appears well-developed and well-nourished.  Pulmonary/Chest: Effort normal.  Musculoskeletal:       Right ankle: Achilles tendon exhibits pain. Achilles tendon exhibits no defect and normal Thompson's test results.  Neurological: He is alert and oriented to person, place, and time.  Psychiatric: He has a normal mood and affect.  Nursing note reviewed.   Ortho Exam  Specialty Comments:  No specialty comments available.  Imaging: No results found.   PMFS History: Patient Active Problem List   Diagnosis Date Noted  . Obstructive sleep apnea on CPAP 03/07/2015  . Post-operative state 03/07/2015  . Achilles rupture, left 10/21/2014  . OSA (obstructive sleep apnea) 04/23/2013  . Bipolar disorder, unspecified 01/07/2013  . Sinus tachycardia 01/07/2013  . Headache(784.0) 01/07/2013  . SDH (subdural hematoma) (HCC) 01/04/2013  . H/O PE 01/04/2013  . Chronic anticoagulation 01/04/2013  . 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  . Acute sinusitis 01/04/2013   Past Medical History:  Diagnosis Date  . Achilles rupture, left   . Bipolar 1 disorder (HCC)   . Dental caries    periodontitis  . Pneumonia   . Pulmonary embolism (HCC)   . Sleep apnea    wears CPAP  . Subdural hematoma (  HCC)     Family History  Problem Relation Age of Onset  . Hypertension Mother   . Multiple sclerosis Sister     Past Surgical History:  Procedure Laterality Date  . ACHILLES TENDON SURGERY Left 10/21/2014   Procedure: Left Achilles Reconstruction;  Surgeon: Nadara Mustard, MD;  Location: [facility] OR;  Service: Orthopedics;  Laterality: Left;  . APPENDECTOMY    . CRANIOTOMY N/A 01/19/2013   Procedure: CRANIOTOMY HEMATOMA EVACUATION SUBDURAL;  Surgeon: Hewitt Shorts, MD;  Location: [facility] OR[facility];  Service: Neurosurgery;  Laterality: N/A;  . CYSTECTOMY     right head  . ELBOW SURGERY     right  . FRACTURE SURGERY     finger  . MULTIPLE EXTRACTIONS  WITH ALVEOLOPLASTY N/A 03/07/2015   Procedure: MULTIPLE EXTRACTION WITH ALVEOLOPLASTY;  Surgeon: [name], DDS;  Location: [facility] OR;  Service: Oral Surgery;  Laterality: N/A;   Social History   Occupational History  . disability    Social History Main Topics  . Smoking status: Never Smoker  . Smokeless tobacco: Never Used  . Alcohol use No  . Drug use: No  . Sexual activity: Not on file

## 2016-02-28 ENCOUNTER — Ambulatory Visit (INDEPENDENT_AMBULATORY_CARE_PROVIDER_SITE_OTHER): Payer: Medicaid Other | Admitting: Orthopedic Surgery

## 2016-02-29 ENCOUNTER — Other Ambulatory Visit (INDEPENDENT_AMBULATORY_CARE_PROVIDER_SITE_OTHER): Payer: Self-pay | Admitting: Orthopedic Surgery

## 2016-03-13 ENCOUNTER — Encounter: Payer: Self-pay | Admitting: Neurology

## 2016-03-13 ENCOUNTER — Other Ambulatory Visit: Payer: Self-pay | Admitting: Neurology

## 2016-03-13 ENCOUNTER — Ambulatory Visit (INDEPENDENT_AMBULATORY_CARE_PROVIDER_SITE_OTHER): Payer: Medicaid Other | Admitting: Neurology

## 2016-03-13 VITALS — BP 110/70 | HR 102 | Ht 69.0 in | Wt 248.0 lb

## 2016-03-13 DIAGNOSIS — R29898 Other symptoms and signs involving the musculoskeletal system: Secondary | ICD-10-CM

## 2016-03-13 DIAGNOSIS — G250 Essential tremor: Secondary | ICD-10-CM | POA: Diagnosis not present

## 2016-03-13 LAB — TSH: TSH: 0.65 mIU/L (ref 0.40–4.50)

## 2016-03-13 NOTE — Patient Instructions (Addendum)
1.  NCS/EMG of the arms 2.  Check blood work  We will call you with the results of the testing

## 2016-03-13 NOTE — Progress Notes (Signed)
[facility] Neurology Division Clinic Note - Initial Visit   Date: 03/13/16  Justin [name] MRN: [id number] DOB: 10-28-1965   Dear Dr. Juleen China:  Thank you for your kind referral of Justin [name] for consultation of hand tremor and weakness. Although his history is well known to you, please allow [place] to reiterate it for the purpose of our medical record. The patient was accompanied to the clinic by self.    History of Present Illness: Justin [name] is a 50 y.o. left-handed African American male with bipolar depression, hypertension, SDH s/p craniotomy (2014) and hyperlipidemia presenting for evaluation of abnormal hand tremor.    For the past year, he has noticed new tremor of the hands, especially with position, such as holding the phone.  If he repositions her hand, tremor goes away. It is not constant and he has not noticed any exacerbating factors. No family history of tremor.  He drinks one caffienated soda daily, no coffee.  He has a history of bipolar depression and has been on lithium 600 mg daily and seroquel 800 mg at bedtime for a long time.  He also complains of weakness of the hand and gives a specific example of holding up his phone with his arm extended and on several occasions finds that his phone slips out of his hand. He does not have associated numbness or tingling and symptoms quickly resolve with repositioning. There is no permanent weakness of the hand. He denies any neck pain.  Out-side paper records, electronic medical record, and images have been reviewed where available and summarized as:  CT head 01/13/2016:  No acute process MRI brain 01/13/2016: 1. No acute intracranial abnormality. 2. Mild chronic small vessel ischemic disease.   Past Medical History:  Diagnosis Date  . Achilles rupture, left   . Bipolar 1 disorder (HCC)   . Dental caries    periodontitis  . Pneumonia   . Pulmonary embolism (HCC)   . Sleep apnea    wears CPAP  . Subdural  hematoma [facility])     Past Surgical History:  Procedure Laterality Date  . ACHILLES TENDON SURGERY Left 10/21/2014   Procedure: Left Achilles Reconstruction;  Surgeon: Nadara Mustard, MD;  Location: [facility] OR;  Service: Orthopedics;  Laterality: Left;  . APPENDECTOMY    . CRANIOTOMY N/A 01/19/2013   Procedure: CRANIOTOMY HEMATOMA EVACUATION SUBDURAL;  Surgeon: Hewitt Shorts, MD;  Location: [facility];  Service: Neurosurgery;  Laterality: N/A;  . CYSTECTOMY     right head  . ELBOW SURGERY     right  . FRACTURE SURGERY     finger  . MULTIPLE EXTRACTIONS WITH ALVEOLOPLASTY N/A 03/07/2015   Procedure: MULTIPLE EXTRACTION WITH ALVEOLOPLASTY;  Surgeon: [name], DDS;  Location: [facility] OR;  Service: Oral Surgery;  Laterality: N/A;     Medications:  Outpatient Encounter Prescriptions as of 03/13/2016  Medication Sig Note  . acetaminophen (TYLENOL) 500 MG tablet Take 1 tablet (500 mg total) by mouth every 6 (six) hours as needed.   Marland Kitchen AFLURIA PRESERVATIVE FREE 0.5 ML SUSY inject 0.5 milliliter intramuscularly 01/31/2016: Received from: External Pharmacy  . aspirin EC 325 MG tablet Take 1 tablet (325 mg total) by mouth daily.   . fluticasone (FLONASE) 50 MCG/ACT nasal spray Place 2 sprays into both nostrils daily.   Marland Kitchen gabapentin (NEURONTIN) 300 MG capsule take 1 capsule by mouth three times a day if needed for NERVE PAIN   . ibuprofen (ADVIL,MOTRIN) 600 MG tablet Take  1 tablet (600 mg total) by mouth every 6 (six) hours as needed.   . indomethacin (INDOCIN SR) 75 MG CR capsule take 1 capsule by mouth twice a day for inflammation 03/13/2016: Received from: External Pharmacy  . lisinopril-hydrochlorothiazide (PRINZIDE,ZESTORETIC) 10-12.5 MG tablet  03/13/2016: Received from: External Pharmacy  . lithium 600 MG capsule Take 600 mg by mouth at bedtime.   Marland Kitchen lithium carbonate (ESKALITH) 450 MG CR tablet Take 450 mg by mouth every morning.   . mirtazapine (REMERON) 45 MG tablet Take 45 mg by mouth at  bedtime.   . nitroGLYCERIN (NITRODUR - DOSED IN MG/24 HR) 0.2 mg/hr patch  01/31/2016: Received from: External Pharmacy  . QUEtiapine (SEROQUEL XR) 400 MG 24 hr tablet Take 800 mg by mouth at bedtime.   . simvastatin (ZOCOR) 20 MG tablet  03/13/2016: Received from: External Pharmacy  . tiZANidine (ZANAFLEX) 4 MG tablet Take 4 mg by mouth at bedtime as needed (for achilles).  01/13/2016: Received from: External Pharmacy  . [DISCONTINUED] ibuprofen (ADVIL,MOTRIN) 600 MG tablet Take 1 tablet (600 mg total) by mouth every 6 (six) hours as needed.   . [DISCONTINUED] oxyCODONE-acetaminophen (PERCOCET) 10-325 MG tablet Take 1-2 tablets by mouth every 4 (four) hours as needed for pain.   . [DISCONTINUED] predniSONE (DELTASONE) 20 MG tablet Take 2 tablets (40 mg total) by mouth daily.   . [DISCONTINUED] traMADol (ULTRAM) 50 MG tablet Take 1 tablet (50 mg total) by mouth every 6 (six) hours as needed.   . [DISCONTINUED] guaiFENesin-codeine 100-10 MG/5ML syrup Take 10 mLs by mouth 3 (three) times daily as needed for cough.   . [DISCONTINUED] HYDROcodone-acetaminophen (NORCO/VICODIN) 5-325 MG tablet Take 1-2 tablets by mouth every 6 hours as needed for pain and/or cough. (Patient not taking: Reported on 03/13/2016)   . [DISCONTINUED] HYDROcodone-acetaminophen (NORCO/VICODIN) 5-325 MG tablet Take 1 tablet by mouth every 4 (four) hours as needed for severe pain. (Patient not taking: Reported on 02/22/2016)   . [DISCONTINUED] ibuprofen (ADVIL,MOTRIN) 600 MG tablet Take 1 tablet (600 mg total) by mouth every 6 (six) hours as needed.    No facility-administered encounter medications on file as of 03/13/2016.      Allergies: No Known Allergies  Family History: Family History  Problem Relation Age of Onset  . Hypertension Mother   . Stroke Mother   . Multiple sclerosis Sister   . Down syndrome Son     Social History: Social History  Substance Use Topics  . Smoking status: Never Smoker  . Smokeless  tobacco: Never Used  . Alcohol use No   Social History   Social History Narrative   FROM [place], [place]    Lives in a 2 story apartment.  His cousin is currently staying with him.  Has 4 children.  On disability for the past 10 years for pulmonary embolism, bipolar disease.  Education: high school.    Review of Systems:  CONSTITUTIONAL: No fevers, chills, night sweats, or weight loss.   EYES: No visual changes or eye pain ENT: No hearing changes.  No history of nose bleeds.   RESPIRATORY: No cough, wheezing and shortness of breath.   CARDIOVASCULAR: Negative for chest pain, and palpitations.   GI: Negative for abdominal discomfort, blood in stools or black stools.  No recent change in bowel habits.   GU:  No history of incontinence.   MUSCLOSKELETAL: No history of joint pain or swelling.  No myalgias.   SKIN: Negative for lesions, rash, and itching.   HEMATOLOGY/ONCOLOGY:  Negative for prolonged bleeding, bruising easily, and swollen nodes.  No history of cancer.   ENDOCRINE: Negative for cold or heat intolerance, polydipsia or goiter.   PSYCH:  +depression or anxiety symptoms.   NEURO: As Above.   Vital Signs:  BP 110/70   Pulse (!) 102   Ht 5\' 9"  (1.753 m)   Wt 248 lb (112.5 kg)   SpO2 95%   BMI 36.62 kg/m    General Medical Exam:   General:  Well appearing, comfortable.   Eyes/ENT: see cranial nerve examination.   Neck: No masses appreciated.  Full range of motion without tenderness.  No carotid bruits. Respiratory:  Clear to auscultation, good air entry bilaterally.   Cardiac:  Regular rate and rhythm, no murmur.   Extremities:  No deformities, edema, or skin discoloration.  Skin:  No rashes or lesions.  Neurological Exam: MENTAL STATUS including orientation to time, place, person, recent and remote memory, attention span and concentration, language, and fund of knowledge is normal.  Speech is not dysarthric.  CRANIAL NERVES: II:  No visual field defects.   Unremarkable fundi.   III-IV-VI: Pupils equal round and reactive to light.  Normal conjugate, extra-ocular eye movements in all directions of gaze.  No nystagmus.  No ptosis.   V:  Normal facial sensation.     VII:  Normal facial symmetry and movements.  VIII:  Normal hearing and vestibular function.   IX-X:  Normal palatal movement.   XI:  Normal shoulder shrug and head rotation.   XII:  Normal tongue strength and range of motion, no deviation or fasciculation.  MOTOR:  No atrophy or fasciculations. There is a very mild postural tremor of the hands when outstretched, this disappears with movement.  There is no resting tremor.  No pronator drift.  Tone is normal.    Right Upper Extremity:    Left Upper Extremity:    Deltoid  5/5   Deltoid  5/5   Biceps  5/5   Biceps  5/5   Triceps  5/5   Triceps  5/5   Wrist extensors  5/5   Wrist extensors  5/5   Wrist flexors  5/5   Wrist flexors  5/5   Finger extensors  5/5   Finger extensors  5/5   Finger flexors  5/5   Finger flexors  5/5   Dorsal interossei  5/5   Dorsal interossei  5/5   Abductor pollicis  5/5   Abductor pollicis  5/5   Tone (Ashworth scale)  0  Tone (Ashworth scale)  0   Right Lower Extremity:    Left Lower Extremity:    Hip flexors  5/5   Hip flexors  5/5   Hip extensors  5/5   Hip extensors  5/5   Knee flexors  5/5   Knee flexors  5/5   Knee extensors  5/5   Knee extensors  5/5   Dorsiflexors  5/5   Dorsiflexors  5/5   Plantarflexors  5/5   Plantarflexors  5/5   Toe extensors  5/5   Toe extensors  5/5   Toe flexors  5/5   Toe flexors  5/5   Tone (Ashworth scale)  0  Tone (Ashworth scale)  0   MSRs:  Right  Left brachioradialis 2+  brachioradialis 2+  biceps 2+  biceps 2+  triceps 2+  triceps 2+  patellar 2+  patellar 2+  ankle jerk 2+  ankle jerk 2+  Hoffman no  Hoffman no  plantar response down  plantar response down   SENSORY:  Normal and symmetric  perception of light touch, pinprick, vibration, and proprioception.  Romberg's sign absent.   COORDINATION/GAIT: Normal finger-to- nose-finger and heel-to-shin.  Intact rapid alternating movements bilaterally.  Gait narrow based and stable.    IMPRESSION: 1. 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 Alternatively, both his lithium and quetiapine which he has been taking for a number of years also has a propensity to develop tremors. At this point, I do not feel they are significant to warrant any changes in his medication or treatment. Patient was reassured and informed that if this worsens, management options can be readdressed. Check TSH for reversible causes.  2.  Bilateral hand weakness, subjective. I cannot appreciate any weakness on his exam, electrodiagnostic testing of the upper extremities will be ordered to further investigate his symptoms. He does tend to spend a lot of time on his phone and entrapment neuropathy is certainly possible, however would expect greater degree of paresthesias, which he denies.  Further recommendations will be based on the results of his testing.   The duration of this appointment visit was 45 minutes of face-to-face time with the patient.  Greater than 50% of this time was spent in counseling, explanation of diagnosis, planning of further management, and coordination of care.   Thank you for allowing me to participate in patient's care.  If I can answer any additional questions, I would be pleased to do so.    Sincerely,    Jeanae Whitmill K. Allena Katz, DO

## 2016-03-14 ENCOUNTER — Other Ambulatory Visit: Payer: Self-pay | Admitting: *Deleted

## 2016-03-14 MED ORDER — DIAZEPAM 5 MG PO TABS: 5.0000 mg | ORAL_TABLET | ORAL | 0 refills | Status: DC

## 2016-03-15 ENCOUNTER — Encounter: Payer: Self-pay | Admitting: *Deleted

## 2016-03-15 ENCOUNTER — Ambulatory Visit (INDEPENDENT_AMBULATORY_CARE_PROVIDER_SITE_OTHER): Payer: Medicaid Other | Admitting: Neurology

## 2016-03-15 DIAGNOSIS — R29898 Other symptoms and signs involving the musculoskeletal system: Secondary | ICD-10-CM | POA: Diagnosis not present

## 2016-03-15 DIAGNOSIS — G5602 Carpal tunnel syndrome, left upper limb: Secondary | ICD-10-CM

## 2016-03-15 DIAGNOSIS — G250 Essential tremor: Secondary | ICD-10-CM

## 2016-03-15 NOTE — Procedures (Signed)
[facility]  [address] [place], [address]  [place], [place] [address] Tel: [phone] Fax:  [phone] Test Date:  03/15/2016  Patient: Justin [name] DOB: September 23, 1965 Physician: Nita Sickle, DO  Sex: Male Height: 5\' 9"  Ref Phys: Nita Sickle, DO  ID#: [id number] Temp: 33.6C Technician: Judie Petit. Dean   Patient Complaints: This is a 50 year old gentleman referred for evaluation of bilateral hand weakness.  NCV & EMG Findings: Extensive electrodiagnostic testing of the right upper extremity and additional studies of the left shows:  1. Bilateral median and ulnar sensory responses are within normal limits.  Left mixed palmar sensory response shows prolonged latency. Right mixed palmar sensory responses are within normal limits. 2. Bilateral median and ulnar motor responses are within normal limits. 3. There is no evidence of active or chronic motor axon loss changes affecting any of the tested muscles. Motor unit configuration and recruitment pattern is within normal limits.  Impression: 1. Left median neuropathy at or distal to the wrist, consistent with the clinical diagnosis of carpal tunnel syndrome. Overall, these findings are very mild in degree electrically. 2. There is no evidence of a cervical radiculopathy or diffuse myopathy affecting the upper extremities.   ___________________________ Nita Sickle, DO    Nerve Conduction Studies Anti Sensory Summary Table   Stim Site NR Peak (ms) Norm Peak (ms) P-T Amp (V) Norm P-T Amp  Left Median Anti Sensory (2nd Digit)  Wrist    2.8 <3.6 15.8 >15  Right Median Anti Sensory (2nd Digit)  Wrist    2.7 <3.6 19.5 >15  Left Ulnar Anti Sensory (5th Digit)  Wrist    3.0 <3.1 23.5 >10  Right Ulnar Anti Sensory (5th Digit)  35.3C  Wrist    3.1 <3.1 16.5 >10   Motor Summary Table   Stim Site NR Onset (ms) Norm Onset (ms) O-P Amp (mV) Norm O-P Amp Site1 Site2 Delta-0 (ms) Dist (cm) Vel (m/s) Norm Vel (m/s)  Left Median Motor  (Abd Poll Brev)  Wrist    3.1 <4.0 8.2 >6 Elbow Wrist 3.9 23.0 59 >50  Elbow    7.0  7.3         Right Median Motor (Abd Poll Brev)  Wrist    3.3 <4.0 9.7 >6 Elbow Wrist 4.6 25.0 54 >50  Elbow    7.9  9.0         Left Ulnar Motor (Abd Dig Minimi)  Wrist    2.5 <3.1 10.3 >7 B Elbow Wrist 3.5 19.0 54 >50  B Elbow    6.0  9.9  A Elbow B Elbow 1.7 10.0 59 >50  A Elbow    7.7  9.7         Right Ulnar Motor (Abd Dig Minimi)  Wrist    2.7 <3.1 8.8 >7 B Elbow Wrist 3.9 22.0 56 >50  B Elbow    6.6  8.6  A Elbow B Elbow 1.5 10.0 67 >50  A Elbow    8.1  7.9          Comparison Summary Table   Stim Site NR Peak (ms) Norm Peak (ms) P-T Amp (V) Site1 Site2 Delta-P (ms) Norm Delta (ms)  Left Median/Ulnar Palm Comparison (Wrist - 8cm)  Median Palm    2.2 <2.2 18.8 Median Palm Ulnar Palm 0.4   Ulnar Palm    1.8 <2.2 20.5      Right Median/Ulnar Palm Comparison (Wrist - 8cm)  Median Palm    1.6 <  2.2 16.8 Median Palm Ulnar Palm 0.2   Ulnar Palm    1.8 <2.2 10.9       EMG   Side Muscle Ins Act Fibs Psw Fasc Number Recrt Dur Dur. Amp Amp. Poly Poly. Comment  Right 1stDorInt Nml Nml Nml Nml Nml Nml Nml Nml Nml Nml Nml Nml N/A  Right Abd Poll Brev Nml Nml Nml Nml Nml Nml Nml Nml Nml Nml Nml Nml N/A  Right Ext Indicis Nml Nml Nml Nml Nml Nml Nml Nml Nml Nml Nml Nml N/A  Right PronatorTeres Nml Nml Nml Nml Nml Nml Nml Nml Nml Nml Nml Nml N/A  Right Biceps Nml Nml Nml Nml Nml Nml Nml Nml Nml Nml Nml Nml N/A  Right Triceps Nml Nml Nml Nml Nml Nml Nml Nml Nml Nml Nml Nml N/A  Right Deltoid Nml Nml Nml Nml Nml Nml Nml Nml Nml Nml Nml Nml N/A  Left 1stDorInt Nml Nml Nml Nml Nml Nml Nml Nml Nml Nml Nml Nml N/A  Left Abd Poll Brev Nml Nml Nml Nml Nml Nml Nml Nml Nml Nml Nml Nml N/A  Left Ext Indicis Nml Nml Nml Nml Nml Nml Nml Nml Nml Nml Nml Nml N/A  Left PronatorTeres Nml Nml Nml Nml Nml Nml Nml Nml Nml Nml Nml Nml N/A  Left Biceps Nml Nml Nml Nml Nml Nml Nml Nml Nml Nml Nml Nml N/A  Left Triceps Nml Nml  Nml Nml Nml Nml Nml Nml Nml Nml Nml Nml N/A  Left Deltoid Nml Nml Nml Nml Nml Nml Nml Nml Nml Nml Nml Nml N/A      Waveforms:

## 2016-03-16 ENCOUNTER — Other Ambulatory Visit: Payer: Self-pay | Admitting: *Deleted

## 2016-03-16 DIAGNOSIS — G5602 Carpal tunnel syndrome, left upper limb: Secondary | ICD-10-CM

## 2016-03-16 MED ORDER — AMBULATORY NON FORMULARY MEDICATION
1.0000 [IU] | Freq: Every day | 0 refills | Status: DC
Start: 2016-03-16 — End: 2016-10-24

## 2016-03-20 ENCOUNTER — Telehealth: Payer: Self-pay | Admitting: Neurology

## 2016-03-20 NOTE — Telephone Encounter (Signed)
PT called in regards to the prescription for a brace for his carpal tunnel and he said he went to 2 different places and they will not accept his insurance/Dawn CB# [phone]

## 2016-03-21 ENCOUNTER — Encounter (INDEPENDENT_AMBULATORY_CARE_PROVIDER_SITE_OTHER): Payer: Self-pay | Admitting: Orthopedic Surgery

## 2016-03-21 ENCOUNTER — Ambulatory Visit (INDEPENDENT_AMBULATORY_CARE_PROVIDER_SITE_OTHER): Payer: Medicaid Other | Admitting: Family

## 2016-03-21 ENCOUNTER — Other Ambulatory Visit (HOSPITAL_COMMUNITY): Payer: Self-pay | Admitting: Neurology

## 2016-03-21 VITALS — Ht 69.0 in | Wt 248.0 lb

## 2016-03-21 DIAGNOSIS — M7661 Achilles tendinitis, right leg: Secondary | ICD-10-CM

## 2016-03-21 DIAGNOSIS — G459 Transient cerebral ischemic attack, unspecified: Secondary | ICD-10-CM

## 2016-03-21 MED ORDER — IBUPROFEN 600 MG PO TABS: 600.0000 mg | ORAL_TABLET | Freq: Four times a day (QID) | ORAL | 0 refills | Status: DC | PRN

## 2016-03-21 NOTE — Progress Notes (Signed)
Office Visit Note   Patient: Justin [name]           Date of Birth: 25-May-1965           MRN: [id number] Visit Date: 03/21/2016              Requested by: [facility] [address] [place], [place] [address] PCP: [facility]   Assessment & Plan: Visit Diagnoses:  1. Achilles tendinitis of right lower extremity     Plan: Have placed an [facility] for [facility] with [facility]. Encouraged him to work aggressively on heel cord stretching. Continue with ibuprofen for pain and inflammation.  Follow-Up Instructions: Return in about 4 weeks (around 04/18/2016).   Orders:  Orders Placed This Encounter  Procedures  . Ambulatory referral to Physical Therapy   Meds ordered this encounter  Medications  . ibuprofen (ADVIL,MOTRIN) 600 MG tablet    Sig: Take 1 tablet (600 mg total) by mouth every 6 (six) hours as needed.    Dispense:  60 tablet    Refill:  0      Procedures: No procedures performed   Clinical Data: No additional findings.   Subjective: Chief Complaint  Patient presents with  . Right Leg - Follow-up    Right gastrocnemius recession 11/01/15. He is 4 months 3 weeks post op. He complains of persistent pain in right ankle pain radiating up his leg to knee. He states is miserable. He did not end up having physical therapy.     The patient is a 51 year old gentleman who is seen today in follow-up for right Achilles tendinitis. He is status post right gastrocnemius recession on September 15 of last year. Complains of miserable intense pain to the right heel. States he lost his order for physical therapy did not know he needed to call them and has not completed any visits. Reports doing heel chord stretching to times daily.    Review of Systems  Constitutional: Negative for chills and fever.     Objective: Vital Signs: Ht 5\' 9"  (1.753 m)   Wt 248 lb (112.5 kg)   BMI 36.62 kg/m   Physical Exam  Constitutional: He is oriented to person,  place, and time. He appears well-developed and well-nourished.  Pulmonary/Chest: Effort normal.  Neurological: He is alert and oriented to person, place, and time.  Psychiatric: He has a normal mood and affect.  Nursing note reviewed. Steady gait.  Right Ankle Exam   Tenderness  Right ankle tenderness location: Achilles.    Range of Motion  Right ankle dorsiflexion: To 90  Other  Pulse: present       Specialty Comments:  No specialty comments available.  Imaging: No results found.   PMFS History: Patient Active Problem List   Diagnosis Date Noted  . Obstructive sleep apnea on CPAP 03/07/2015  . Post-operative state 03/07/2015  . Achilles rupture, left 10/21/2014  . OSA (obstructive sleep apnea) 04/23/2013  . Bipolar disorder, unspecified 01/07/2013  . Sinus tachycardia 01/07/2013  . Headache(784.0) 01/07/2013  . SDH (subdural hematoma) (HCC) 01/04/2013  . H/O PE 01/04/2013  . Chronic anticoagulation 01/04/2013  . 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  . Acute sinusitis 01/04/2013   Past Medical History:  Diagnosis Date  . Achilles rupture, left   . Bipolar 1 disorder (HCC)   . Dental caries    periodontitis  . Pneumonia   . Pulmonary embolism (HCC)   . Sleep apnea    wears CPAP  . Subdural  hematoma (HCC)     Family History  Problem Relation Age of Onset  . Hypertension Mother   . Stroke Mother   . Multiple sclerosis Sister   . Down syndrome Son     Past Surgical History:  Procedure Laterality Date  . ACHILLES TENDON SURGERY Left 10/21/2014   Procedure: Left Achilles Reconstruction;  Surgeon: Nadara Mustard, MD;  Location: [facility] OR;  Service: Orthopedics;  Laterality: Left;  . APPENDECTOMY    . CRANIOTOMY N/A 01/19/2013   Procedure: CRANIOTOMY HEMATOMA EVACUATION SUBDURAL;  Surgeon: Hewitt Shorts, MD;  Location: [facility];  Service: Neurosurgery;  Laterality: N/A;  . CYSTECTOMY     right head  . ELBOW SURGERY     right  . FRACTURE  SURGERY     finger  . MULTIPLE EXTRACTIONS WITH ALVEOLOPLASTY N/A 03/07/2015   Procedure: MULTIPLE EXTRACTION WITH ALVEOLOPLASTY;  Surgeon: [name], DDS;  Location: [facility] OR;  Service: Oral Surgery;  Laterality: N/A;   Social History   Occupational History  . disability    Social History Main Topics  . Smoking status: Never Smoker  . Smokeless tobacco: Never Used  . Alcohol use No  . Drug use: No  . Sexual activity: Not on file

## 2016-03-21 NOTE — Telephone Encounter (Signed)
I spoke with patient and he said that [facility] nor [facility] will take his insurance.  He had to borrow $200 to get the wrist splints.  He said that he would bring the splints up here to show them to me.

## 2016-03-26 ENCOUNTER — Ambulatory Visit (HOSPITAL_COMMUNITY)
Admission: RE | Admit: 2016-03-26 | Discharge: 2016-03-26 | Disposition: A | Discharge status: Home / Self Care | Payer: Medicaid Other | Source: Ambulatory Visit | Attending: Neurology | Admitting: Neurology

## 2016-03-26 DIAGNOSIS — G459 Transient cerebral ischemic attack, unspecified: Secondary | ICD-10-CM | POA: Diagnosis not present

## 2016-03-26 DIAGNOSIS — Z8673 Personal history of transient ischemic attack (TIA), and cerebral infarction without residual deficits: Secondary | ICD-10-CM | POA: Diagnosis present

## 2016-03-26 DIAGNOSIS — I517 Cardiomegaly: Secondary | ICD-10-CM | POA: Diagnosis not present

## 2016-03-26 NOTE — Progress Notes (Signed)
Echocardiogram 2D Echocardiogram has been performed.  Justin [name] 03/26/2016, 2:37 PM

## 2016-03-27 ENCOUNTER — Ambulatory Visit: Payer: Medicaid Other | Attending: Family

## 2016-04-06 ENCOUNTER — Ambulatory Visit: Payer: Medicaid Other | Admitting: Neurology

## 2016-04-18 ENCOUNTER — Ambulatory Visit (INDEPENDENT_AMBULATORY_CARE_PROVIDER_SITE_OTHER): Payer: Medicaid Other | Admitting: Family

## 2016-04-27 ENCOUNTER — Encounter (INDEPENDENT_AMBULATORY_CARE_PROVIDER_SITE_OTHER): Payer: Self-pay | Admitting: Family

## 2016-04-27 ENCOUNTER — Encounter (INDEPENDENT_AMBULATORY_CARE_PROVIDER_SITE_OTHER): Payer: Self-pay

## 2016-04-27 ENCOUNTER — Ambulatory Visit (INDEPENDENT_AMBULATORY_CARE_PROVIDER_SITE_OTHER): Payer: Medicaid Other | Admitting: Family

## 2016-04-27 VITALS — Ht 69.0 in | Wt 248.0 lb

## 2016-04-27 DIAGNOSIS — M7662 Achilles tendinitis, left leg: Secondary | ICD-10-CM

## 2016-04-27 MED ORDER — NAPROXEN 500 MG PO TABS: 500.0000 mg | ORAL_TABLET | Freq: Two times a day (BID) | ORAL | 0 refills | Status: DC | PRN

## 2016-04-27 MED ORDER — NITROGLYCERIN 0.2 MG/HR TD PT24: 0.2000 mg | MEDICATED_PATCH | Freq: Every day | TRANSDERMAL | 2 refills | Status: DC

## 2016-04-27 NOTE — Progress Notes (Signed)
Office Visit Note   Patient: Justin [name]           Date of Birth: [birth year]           MRN: [id number] Visit Date: 04/27/2016              Requested by: [facility] [address] [place], [place] [address] PCP: [facility]  Chief Complaint  Patient presents with  . Left Foot - Pain    S/p achilles recon 10/2015    HPI: Patient states that he is having numbness at his left achilles. Its painful per pt report and states that he walks a lot and this is very painful for him to do. He tries to stretch and uses ibuprofen prn pain. He complains of a lump on the achilles and that this is tender to touch  And his shoe rubs and causes additional discomfort. Patient is s/p a left achilles recon 10/2015 Autumn L Forrest, RMA  The patient is 51 year old gentleman complaining of left Achilles pain. This has been a chronic problem. Did notice a palpable lump at the insertion of his Achilles on the left this morning. States just noticed it today. This is painful and tender. Has used ibuprofen in the past for pain. Has an working on heel cord stretching for quite some time without relief. Does complain that his hightop sneakers rub on the back of his Achilles and this increases his discomfort. He is status post left Achilles reconstruction in September of last year.    Assessment & Plan: Visit Diagnoses:  1. Achilles tendinitis, left leg     Plan: Provided with a cam walker and a heel lift. He'll rest the Achilles has also prescribed provided a prescription for naproxen and a refill on the nitroglycerin patches which she will apply to his Achilles daily. Follow-up in office in 3 weeks.  Follow-Up Instructions: Return in about 3 weeks (around 05/18/2016) for c duda.   Ortho Exam Physical Exam  Constitutional: Appears well-developed.  Head: Normocephalic.  Eyes: EOM are normal.  Neck: Normal range of motion.  Cardiovascular: Normal rate.   Pulmonary/Chest: Effort normal.    Neurological: Is alert.  Skin: Skin is warm.  Psychiatric: Has a normal mood and affect. Left ankle: The Achilles is tender to palpation distally. There is palpable nodule near the insertion. There are no defects. Dorsiflexion and plantar flexion are intact. No erythema or warmth him  Imaging: No results found.  Orders:  No orders of the defined types were placed in this encounter.  Meds ordered this encounter  Medications  . nitroGLYCERIN (NITRODUR - DOSED IN MG/24 HR) 0.2 mg/hr patch    Sig: Place 1 patch (0.2 mg total) onto the skin daily.    Dispense:  30 patch    Refill:  2  . naproxen (NAPROSYN) 500 MG tablet    Sig: Take 1 tablet (500 mg total) by mouth 2 (two) times daily as needed for mild pain or moderate pain. With meals    Dispense:  60 tablet    Refill:  0     Procedures: No procedures performed  Clinical Data: No additional findings.  Subjective: Review of Systems  Constitutional: Negative for chills and fever.  Musculoskeletal: Positive for gait problem and myalgias.    Objective: Vital Signs: Ht 5\' 9"  (1.753 m)   Wt 248 lb (112.5 kg)   BMI 36.62 kg/m   Specialty Comments:  No specialty comments available.  PMFS History: Patient  Active Problem List   Diagnosis Date Noted  . Obstructive sleep apnea on CPAP 03/07/2015  . Post-operative state 03/07/2015  . Achilles rupture, left 10/21/2014  . OSA (obstructive sleep apnea) 04/23/2013  . Bipolar disorder, unspecified 01/07/2013  . Sinus tachycardia 01/07/2013  . Headache(784.0) 01/07/2013  . SDH (subdural hematoma) (HCC) 01/04/2013  . H/O PE 01/04/2013  . Chronic anticoagulation 01/04/2013  . 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  . Acute sinusitis 01/04/2013   Past Medical History:  Diagnosis Date  . Achilles rupture, left   . Bipolar 1 disorder (HCC)   . Dental caries    periodontitis  . Pneumonia   . Pulmonary embolism (HCC)   . Sleep apnea    wears CPAP  . Subdural  hematoma (HCC)     Family History  Problem Relation Age of Onset  . Hypertension Mother   . Stroke Mother   . Multiple sclerosis Sister   . Down syndrome Son     Past Surgical History:  Procedure Laterality Date  . ACHILLES TENDON SURGERY Left 10/21/2014   Procedure: Left Achilles Reconstruction;  Surgeon: Nadara Mustard, MD;  Location: [facility] OR;  Service: Orthopedics;  Laterality: Left;  . APPENDECTOMY    . CRANIOTOMY N/A 01/19/2013   Procedure: CRANIOTOMY HEMATOMA EVACUATION SUBDURAL;  Surgeon: Hewitt Shorts, MD;  Location: [facility];  Service: Neurosurgery;  Laterality: N/A;  . CYSTECTOMY     right head  . ELBOW SURGERY     right  . FRACTURE SURGERY     finger  . MULTIPLE EXTRACTIONS WITH ALVEOLOPLASTY N/A 03/07/2015   Procedure: MULTIPLE EXTRACTION WITH ALVEOLOPLASTY;  Surgeon: [name], DDS;  Location: [facility] OR;  Service: Oral Surgery;  Laterality: N/A;   Social History   Occupational History  . disability    Social History Main Topics  . Smoking status: Never Smoker  . Smokeless tobacco: Never Used  . Alcohol use No  . Drug use: No  . Sexual activity: Not on file

## 2016-05-17 ENCOUNTER — Ambulatory Visit (INDEPENDENT_AMBULATORY_CARE_PROVIDER_SITE_OTHER): Payer: Medicaid Other | Admitting: Orthopedic Surgery

## 2016-05-17 ENCOUNTER — Encounter (INDEPENDENT_AMBULATORY_CARE_PROVIDER_SITE_OTHER): Payer: Self-pay | Admitting: Orthopedic Surgery

## 2016-05-17 VITALS — Ht 69.0 in | Wt 248.0 lb

## 2016-05-17 DIAGNOSIS — M7662 Achilles tendinitis, left leg: Secondary | ICD-10-CM | POA: Insufficient documentation

## 2016-05-17 MED ORDER — IBUPROFEN 600 MG PO TABS: 600.0000 mg | ORAL_TABLET | Freq: Four times a day (QID) | ORAL | 1 refills | Status: DC | PRN

## 2016-05-17 NOTE — Progress Notes (Signed)
Office Visit Note   Patient: Justin [name]           Date of Birth: 26-Apr-1965           MRN: [id number] Visit Date: 05/17/2016              Requested by: [facility] [address] [place], [place] [address] PCP: [facility]  Chief Complaint  Patient presents with  . Left Foot - Pain    Achilles tendonitis left    HPI: The pt is here for follow up of his left achilles tendonitis. He is full weight bearing in a fracture boot. He states that increased activity causes an increase in his discomfort and that he is now starting to feel discomfort t in the right achilles. Pt has an rx for naprosyn and states that this is not helping. Rodena Medin, RMA    Assessment & Plan: Visit Diagnoses:  1. Achilles tendinitis, left leg     Plan: Patient does have nodular Achilles tendinitis. He has heel cord tightness he was given instructions for heel cord stretching to do a metatarsal 5 times a day. He is currently not wearing the night with some patchy recommend he wear the patch daily he was given new heel lift for his fracture boot. Discussed the risks of a surgical debridement of the nodular tendinitis and feel that he still has a better chance for this to heal with nonsurgical intervention.  Follow-Up Instructions: Return in about 4 weeks (around 06/14/2016).   Ortho Exam Examination patient is alert oriented no adenopathy well-dressed normal affect normal respiratory effort has a normal gait with a fracture boot. He is a good dorsalis pedis pulse. There is some nodular changes to the Achilles there is no redness no cellulitis no signs of infection. His heel cord tightness with dorsiflexion only to neutral. The Achilles is tender to palpation. ROS: Patient has no history of fever or chills. Imaging: No results found.  Labs: Lab Results  Component Value Date   REPTSTATUS 01/11/2013 FINAL 01/04/2013   CULT  01/04/2013    NO GROWTH 5 DAYS Performed at [facility]     Orders:  No orders of the defined types were placed in this encounter.  No orders of the defined types were placed in this encounter.    Procedures: No procedures performed  Clinical Data: No additional findings.  Subjective: Review of Systems  Objective: Vital Signs: Ht 5\' 9"  (1.753 m)   Wt 248 lb (112.5 kg)   BMI 36.62 kg/m   Specialty Comments:  No specialty comments available.  PMFS History: Patient Active Problem List   Diagnosis Date Noted  . Achilles tendinitis, left leg 05/17/2016  . Obstructive sleep apnea on CPAP 03/07/2015  . Post-operative state 03/07/2015  . Achilles rupture, left 10/21/2014  . OSA (obstructive sleep apnea) 04/23/2013  . Bipolar disorder, unspecified 01/07/2013  . Sinus tachycardia 01/07/2013  . Headache(784.0) 01/07/2013  . SDH (subdural hematoma) (HCC) 01/04/2013  . H/O PE 01/04/2013  . Chronic anticoagulation 01/04/2013  . 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  . Acute sinusitis 01/04/2013   Past Medical History:  Diagnosis Date  . Achilles rupture, left   . Bipolar 1 disorder (HCC)   . Dental caries    periodontitis  . Pneumonia   . Pulmonary embolism (HCC)   . Sleep apnea    wears CPAP  . Subdural hematoma (HCC)     Family History  Problem Relation Age of  Onset  . Hypertension Mother   . Stroke Mother   . Multiple sclerosis Sister   . Down syndrome Son     Past Surgical History:  Procedure Laterality Date  . ACHILLES TENDON SURGERY Left 10/21/2014   Procedure: Left Achilles Reconstruction;  Surgeon: Nadara Mustard, MD;  Location: [facility] OR;  Service: Orthopedics;  Laterality: Left;  . APPENDECTOMY    . CRANIOTOMY N/A 01/19/2013   Procedure: CRANIOTOMY HEMATOMA EVACUATION SUBDURAL;  Surgeon: Hewitt Shorts, MD;  Location: [facility];  Service: Neurosurgery;  Laterality: N/A;  . CYSTECTOMY     right head  . ELBOW SURGERY     right  . FRACTURE SURGERY     finger  . MULTIPLE EXTRACTIONS WITH  ALVEOLOPLASTY N/A 03/07/2015   Procedure: MULTIPLE EXTRACTION WITH ALVEOLOPLASTY;  Surgeon: [name], DDS;  Location: [facility] OR;  Service: Oral Surgery;  Laterality: N/A;   Social History   Occupational History  . disability    Social History Main Topics  . Smoking status: Never Smoker  . Smokeless tobacco: Never Used  . Alcohol use No  . Drug use: No  . Sexual activity: Not on file

## 2016-05-23 ENCOUNTER — Encounter (HOSPITAL_COMMUNITY): Payer: Self-pay | Admitting: *Deleted

## 2016-05-23 ENCOUNTER — Emergency Department (HOSPITAL_COMMUNITY): Payer: Medicaid Other

## 2016-05-23 ENCOUNTER — Emergency Department (HOSPITAL_COMMUNITY)
Admission: EM | Admit: 2016-05-23 | Discharge: 2016-05-23 | Disposition: A | Discharge status: Home / Self Care | Payer: Medicaid Other | Attending: Emergency Medicine | Admitting: Emergency Medicine

## 2016-05-23 DIAGNOSIS — R0789 Other chest pain: Secondary | ICD-10-CM | POA: Diagnosis not present

## 2016-05-23 DIAGNOSIS — R05 Cough: Secondary | ICD-10-CM | POA: Diagnosis present

## 2016-05-23 DIAGNOSIS — Z7982 Long term (current) use of aspirin: Secondary | ICD-10-CM | POA: Diagnosis not present

## 2016-05-23 DIAGNOSIS — J069 Acute upper respiratory infection, unspecified: Secondary | ICD-10-CM | POA: Diagnosis not present

## 2016-05-23 DIAGNOSIS — Z79899 Other long term (current) drug therapy: Secondary | ICD-10-CM | POA: Diagnosis not present

## 2016-05-23 DIAGNOSIS — B9789 Other viral agents as the cause of diseases classified elsewhere: Secondary | ICD-10-CM

## 2016-05-23 MED ORDER — ACETAMINOPHEN 500 MG PO TABS
1000.0000 mg | ORAL_TABLET | Freq: Once | ORAL | Status: AC
  Administered 2016-05-23: 1000 mg via ORAL
  Filled 2016-05-23: qty 2

## 2016-05-23 MED ORDER — IBUPROFEN 800 MG PO TABS: 800.0000 mg | ORAL_TABLET | Freq: Four times a day (QID) | ORAL | 0 refills | Status: DC | PRN

## 2016-05-23 MED ORDER — DM-GUAIFENESIN ER 30-600 MG PO TB12: 1.0000 | ORAL_TABLET | Freq: Two times a day (BID) | ORAL | 0 refills | Status: DC | PRN

## 2016-05-23 MED ORDER — ACETAMINOPHEN 500 MG PO TABS: 1000.0000 mg | ORAL_TABLET | Freq: Four times a day (QID) | ORAL | 0 refills | Status: DC | PRN

## 2016-05-23 NOTE — ED Provider Notes (Deleted)
Patient left without being seen, I did not see or evaluate them.    Lyndal Pulley, MD 05/23/16 1534

## 2016-05-23 NOTE — ED Notes (Signed)
Pt verbalized understanding discharge instructions and denies any further needs or questions at this time. VS stable, ambulatory and steady gait.   

## 2016-05-23 NOTE — ED Triage Notes (Signed)
Pt reports non productive cough x 2 days, denies fever. Has right side chest wall pain that occurs when breathing and coughing. No acute distress noted at triage.

## 2016-05-23 NOTE — ED Provider Notes (Signed)
[facility]-EMERGENCY DEPT Provider Note   CSN: [id number] Arrival date & time: 05/23/16  1402   By signing my name below, I, Freida Busman, attest that this documentation has been prepared under the direction and in the presence of Lyndal Pulley, MD . Electronically Signed: Freida Busman, Scribe. 05/23/2016. 3:47 PM.   History   Chief Complaint Chief Complaint  Patient presents with  . Cough    The history is provided by the patient. No language interpreter was used.     HPI Comments:  Justin [name] is a 51 y.o. male who presents to the Emergency Department complaining of dry cough x 2 days. He reports associated  right sided rib pain secondary to cough, HA, and chills.  No fever. Pt is a non-smoker. No alleviating factors noted.   Past Medical History:  Diagnosis Date  . Achilles rupture, left   . Bipolar 1 disorder (HCC)   . Dental caries    periodontitis  . Pneumonia   . Pulmonary embolism (HCC)   . Sleep apnea    wears CPAP  . Subdural hematoma [facility])     Patient Active Problem List   Diagnosis Date Noted  . Achilles tendinitis, left leg 05/17/2016  . Obstructive sleep apnea on CPAP 03/07/2015  . Post-operative state 03/07/2015  . Achilles rupture, left 10/21/2014  . OSA (obstructive sleep apnea) 04/23/2013  . Bipolar disorder, unspecified 01/07/2013  . Sinus tachycardia 01/07/2013  . Headache(784.0) 01/07/2013  . SDH (subdural hematoma) (HCC) 01/04/2013  . H/O PE 01/04/2013  . Chronic anticoagulation 01/04/2013  . 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  . Acute sinusitis 01/04/2013    Past Surgical History:  Procedure Laterality Date  . ACHILLES TENDON SURGERY Left 10/21/2014   Procedure: Left Achilles Reconstruction;  Surgeon: Nadara Mustard, MD;  Location: [facility] OR;  Service: Orthopedics;  Laterality: Left;  . APPENDECTOMY    . CRANIOTOMY N/A 01/19/2013   Procedure: CRANIOTOMY HEMATOMA EVACUATION SUBDURAL;  Surgeon: Hewitt Shorts, MD;  Location: [facility] NEUR[facility]  [facility];  Service: Neurosurgery;  Laterality: N/A;  . CYSTECTOMY     right head  . ELBOW SURGERY     right  . FRACTURE SURGERY     finger  . MULTIPLE EXTRACTIONS WITH ALVEOLOPLASTY N/A 03/07/2015   Procedure: MULTIPLE EXTRACTION WITH ALVEOLOPLASTY;  Surgeon: [name], DDS;  Location: [facility] OR;  Service: Oral Surgery;  Laterality: N/A;       Home Medications    Prior to Admission medications   Medication Sig Start Date End Date Taking? Authorizing Provider  acetaminophen (TYLENOL) 500 MG tablet Take 1 tablet (500 mg total) by mouth every 6 (six) hours as needed. 08/08/15   Stevi Barrett, PA-C  AFLURIA PRESERVATIVE FREE 0.5 ML SUSY inject 0.5 milliliter intramuscularly 01/11/16   Historical Provider, MD  AMBULATORY NON FORMULARY MEDICATION 1 Units by Other route daily. Left wrist splint 03/16/16   Donika K Patel, DO  aspirin EC 325 MG tablet Take 1 tablet (325 mg total) by mouth daily. 10/21/14   Nadara Mustard, MD  fluticasone (FLONASE) 50 MCG/ACT nasal spray Place 2 sprays into both nostrils daily. 04/06/15   Mercedes Street, PA-C  gabapentin (NEURONTIN) 300 MG capsule take 1 capsule by mouth three times a day if needed for NERVE PAIN 02/29/16   Nadara Mustard, MD  ibuprofen (ADVIL,MOTRIN) 600 MG tablet Take 1 tablet (600 mg total) by mouth every 6 (six) hours as needed. 05/17/16   Nadara Mustard, MD  indomethacin (INDOCIN SR)  75 MG CR capsule take 1 capsule by mouth twice a day for inflammation 03/01/16   Historical Provider, MD  lisinopril-hydrochlorothiazide (PRINZIDE,ZESTORETIC) 10-12.5 MG tablet  02/29/16   Historical Provider, MD  lithium 600 MG capsule Take 600 mg by mouth at bedtime.    Historical Provider, MD  lithium carbonate (ESKALITH) 450 MG CR tablet Take 450 mg by mouth every morning. 01/11/16   Historical Provider, MD  mirtazapine (REMERON) 45 MG tablet Take 45 mg by mouth at bedtime.    Historical Provider, MD  naproxen (NAPROSYN) 500 MG tablet Take 1 tablet (500 mg total) by mouth  2 (two) times daily as needed for mild pain or moderate pain. With meals 04/27/16   Adonis Huguenin, NP  nitroGLYCERIN (NITRODUR - DOSED IN MG/24 HR) 0.2 mg/hr patch Place 1 patch (0.2 mg total) onto the skin daily. 04/27/16   Adonis Huguenin, NP  QUEtiapine (SEROQUEL XR) 400 MG 24 hr tablet Take 800 mg by mouth at bedtime.    Historical Provider, MD  simvastatin (ZOCOR) 20 MG tablet  02/29/16   Historical Provider, MD  tiZANidine (ZANAFLEX) 4 MG tablet Take 4 mg by mouth at bedtime as needed (for achilles).  01/11/16   Historical Provider, MD    Family History Family History  Problem Relation Age of Onset  . Hypertension Mother   . Stroke Mother   . Multiple sclerosis Sister   . Down syndrome Son     Social History Social History  Substance Use Topics  . Smoking status: Never Smoker  . Smokeless tobacco: Never Used  . Alcohol use No     Allergies   Patient has no known allergies.   Review of Systems Review of Systems  Constitutional: Positive for chills. Negative for fever.  Respiratory: Positive for cough.   Musculoskeletal:       +Rib pain   Neurological: Positive for headaches.  All other systems reviewed and are negative.    Physical Exam Updated Vital Signs BP (!) 154/109 (BP Location: Left Arm)   Pulse 89   Temp 98.4 F (36.9 C) (Oral)   Resp 19   SpO2 99%   Physical Exam  Constitutional: He is oriented to person, place, and time. He appears well-developed and well-nourished. No distress.  HENT:  Head: Normocephalic and atraumatic.  Nose: Nose normal.  Eyes: Conjunctivae are normal.  Neck: Neck supple. No tracheal deviation present.  Cardiovascular: Normal rate and regular rhythm.  Exam reveals no gallop and no friction rub.   No murmur heard. Pulmonary/Chest: Effort normal. No respiratory distress.  Abdominal: Soft. He exhibits no distension.  Musculoskeletal:  Right lower ribcage tenderness   Neurological: He is alert and oriented to person, place, and  time.  Skin: Skin is warm and dry.  Psychiatric: He has a normal mood and affect.  Nursing note and vitals reviewed.    ED Treatments / Results  DIAGNOSTIC STUDIES:  Oxygen Saturation is 99% on RA, normal by my interpretation.    COORDINATION OF CARE:  3:46 PM Discussed treatment plan with pt at bedside and pt agreed to plan.  Labs (all labs ordered are listed, but only abnormal results are displayed) Labs Reviewed - No data to display  EKG  EKG Interpretation None       Radiology Dg Chest 2 View  Result Date: 05/23/2016 CLINICAL DATA:  Nonproductive cough for 2 days. RIGHT chest wall pain. History of pulmonary embolism. EXAM: CHEST  2 VIEW COMPARISON:  Chest radiograph  January 03, 2016 FINDINGS: Cardiomediastinal silhouette is normal. No pleural effusions or focal consolidations. Bibasilar strandy densities. Mild hyperinflation. Trachea projects midline and there is no pneumothorax. Soft tissue planes and included osseous structures are non-suspicious. IMPRESSION: Similar mild hyperinflation.  Bibasilar atelectasis/scarring. Electronically Signed   By: Awilda Metro M.D.   On: 05/23/2016 14:23    Procedures Procedures (including critical care time)  Medications Ordered in ED Medications  acetaminophen (TYLENOL) tablet 1,000 mg (not administered)     Initial Impression / Assessment and Plan / ED Course  I have reviewed the triage vital signs and the nursing notes.  Pertinent labs & imaging results that were available during my care of the patient were reviewed by me and considered in my medical decision making (see chart for details).     51 y.o. male presents with cough for 2 days with body aches and chills. No signs of respiratory distress, non-toxic appearing, CTAB, no concern for pneumonia with this clinical picture. Chest x-ray negative. Pt discharged with likely viral cough which will be self limited in its course. Advised on optimal use of motrin and tylenol  for fever or symptomatic control. Plan to follow up with PCP as needed and return precautions discussed for worsening or new concerning symptoms. .  Final Clinical Impressions(s) / ED Diagnoses   Final diagnoses:  Viral URI with cough  Anterior chest wall pain    New Prescriptions New Prescriptions   ACETAMINOPHEN (TYLENOL) 500 MG TABLET    Take 2 tablets (1,000 mg total) by mouth every 6 (six) hours as needed for mild pain or moderate pain.   DEXTROMETHORPHAN-GUAIFENESIN (MUCINEX DM) 30-600 MG 12HR TABLET    Take 1 tablet by mouth 2 (two) times daily as needed for cough.   IBUPROFEN (ADVIL,MOTRIN) 800 MG TABLET    Take 1 tablet (800 mg total) by mouth every 6 (six) hours as needed for mild pain or moderate pain.   I personally performed the services described in this documentation, which was scribed in my presence. The recorded information has been reviewed and is accurate.      Lyndal Pulley, MD 05/23/16 [phone]

## 2016-05-25 ENCOUNTER — Emergency Department (HOSPITAL_COMMUNITY)
Admission: EM | Admit: 2016-05-25 | Discharge: 2016-05-25 | Disposition: A | Discharge status: Home / Self Care | Payer: Medicaid Other | Attending: Emergency Medicine | Admitting: Emergency Medicine

## 2016-05-25 ENCOUNTER — Emergency Department (HOSPITAL_COMMUNITY): Payer: Medicaid Other

## 2016-05-25 ENCOUNTER — Encounter (HOSPITAL_COMMUNITY): Payer: Self-pay | Admitting: Emergency Medicine

## 2016-05-25 DIAGNOSIS — Z7982 Long term (current) use of aspirin: Secondary | ICD-10-CM | POA: Diagnosis not present

## 2016-05-25 DIAGNOSIS — W208XXA Other cause of strike by thrown, projected or falling object, initial encounter: Secondary | ICD-10-CM | POA: Insufficient documentation

## 2016-05-25 DIAGNOSIS — S61412A Laceration without foreign body of left hand, initial encounter: Secondary | ICD-10-CM | POA: Insufficient documentation

## 2016-05-25 DIAGNOSIS — Z79899 Other long term (current) drug therapy: Secondary | ICD-10-CM | POA: Diagnosis not present

## 2016-05-25 DIAGNOSIS — Y929 Unspecified place or not applicable: Secondary | ICD-10-CM | POA: Insufficient documentation

## 2016-05-25 DIAGNOSIS — S6992XA Unspecified injury of left wrist, hand and finger(s), initial encounter: Secondary | ICD-10-CM | POA: Diagnosis present

## 2016-05-25 DIAGNOSIS — Y939 Activity, unspecified: Secondary | ICD-10-CM | POA: Diagnosis not present

## 2016-05-25 DIAGNOSIS — Y999 Unspecified external cause status: Secondary | ICD-10-CM | POA: Diagnosis not present

## 2016-05-25 DIAGNOSIS — M79642 Pain in left hand: Secondary | ICD-10-CM

## 2016-05-25 MED ORDER — ACETAMINOPHEN 500 MG PO TABS
1000.0000 mg | ORAL_TABLET | Freq: Once | ORAL | Status: AC
  Administered 2016-05-25: 1000 mg via ORAL
  Filled 2016-05-25: qty 2
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
## 2016-05-25 NOTE — Discharge Instructions (Signed)
Please monitor your hand for increased swelling, drainage from the wounds or redness.  Keep your hand elevated and use ice as directed.  If you notice your hand or fingers feels cold (not from ice usage), turn gray, blue, or looses color (or any of the other discussed warning signs) then please return immediately to the emergency department.

## 2016-05-28 ENCOUNTER — Encounter (HOSPITAL_COMMUNITY): Payer: Self-pay | Admitting: Emergency Medicine

## 2016-05-28 ENCOUNTER — Emergency Department (HOSPITAL_COMMUNITY): Payer: Medicaid Other

## 2016-05-28 ENCOUNTER — Emergency Department (HOSPITAL_COMMUNITY)
Admission: EM | Admit: 2016-05-28 | Discharge: 2016-05-28 | Disposition: A | Discharge status: Home / Self Care | Payer: Medicaid Other | Attending: Emergency Medicine | Admitting: Emergency Medicine

## 2016-05-28 DIAGNOSIS — Z7982 Long term (current) use of aspirin: Secondary | ICD-10-CM | POA: Insufficient documentation

## 2016-05-28 DIAGNOSIS — R05 Cough: Secondary | ICD-10-CM | POA: Diagnosis present

## 2016-05-28 DIAGNOSIS — J189 Pneumonia, unspecified organism: Secondary | ICD-10-CM | POA: Diagnosis not present

## 2016-05-28 MED ORDER — DOXYCYCLINE HYCLATE 100 MG PO CAPS: 100.0000 mg | ORAL_CAPSULE | Freq: Two times a day (BID) | ORAL | 0 refills | Status: DC

## 2016-05-28 NOTE — ED Triage Notes (Signed)
Pt reports being diagnosed with URI Friday in ED, states symptoms are worse, c/o bodyaches, coughing and headaches. resp e/u, nad.

## 2016-05-28 NOTE — ED Provider Notes (Signed)
[facility]-EMERGENCY DEPT Provider Note   CSN: [id number] Arrival date & time: 05/28/16  1013   By signing my name below, I, Teofilo Pod, attest that this documentation has been prepared under the direction and in the presence of Joycie Peek, PA-C. Electronically Signed: Teofilo Pod, ED Scribe. 05/28/2016. 10:47 AM.   History   Chief Complaint Chief Complaint  Patient presents with  . URI    The history is provided by the patient. No language interpreter was used.   HPI Comments:  Justin [name] is a 51 y.o. male with PMHx of PE who presents to the Emergency Department complaining of worsening generalized body aches x 3 days. Pt was seen here 3 days ago and was diagnosed with a URI, and he states his symptoms have worsened. Pt complains of associated cough, headaches. Pt has been drinking fluids normally. He has taken Hall's with no relief. Pt denies sore throat, fever, rhinorrhea, leg swelling.   Past Medical History:  Diagnosis Date  . Achilles rupture, left   . Bipolar 1 disorder (HCC)   . Dental caries    periodontitis  . Pneumonia   . Pulmonary embolism (HCC)   . Sleep apnea    wears CPAP  . Subdural hematoma [facility])     Patient Active Problem List   Diagnosis Date Noted  . Achilles tendinitis, left leg 05/17/2016  . Obstructive sleep apnea on CPAP 03/07/2015  . Post-operative state 03/07/2015  . Achilles rupture, left 10/21/2014  . OSA (obstructive sleep apnea) 04/23/2013  . Bipolar disorder, unspecified 01/07/2013  . Sinus tachycardia 01/07/2013  . Headache(784.0) 01/07/2013  . SDH (subdural hematoma) (HCC) 01/04/2013  . H/O PE 01/04/2013  . Chronic anticoagulation 01/04/2013  . 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  . Acute sinusitis 01/04/2013    Past Surgical History:  Procedure Laterality Date  . ACHILLES TENDON SURGERY Left 10/21/2014   Procedure: Left Achilles Reconstruction;  Surgeon: Nadara Mustard, MD;  Location: [facility] OR;  Service:  Orthopedics;  Laterality: Left;  . APPENDECTOMY    . CRANIOTOMY N/A 01/19/2013   Procedure: CRANIOTOMY HEMATOMA EVACUATION SUBDURAL;  Surgeon: Hewitt Shorts, MD;  Location: [facility] NEURO ORS;  Service: Neurosurgery;  Laterality: N/A;  . CYSTECTOMY     right head  . ELBOW SURGERY     right  . FRACTURE SURGERY     finger  . MULTIPLE EXTRACTIONS WITH ALVEOLOPLASTY N/A 03/07/2015   Procedure: MULTIPLE EXTRACTION WITH ALVEOLOPLASTY;  Surgeon: [name], DDS;  Location: [facility] OR;  Service: Oral Surgery;  Laterality: N/A;       Home Medications    Prior to Admission medications   Medication Sig Start Date End Date Taking? Authorizing Provider  acetaminophen (TYLENOL) 500 MG tablet Take 2 tablets (1,000 mg total) by mouth every 6 (six) hours as needed for mild pain or moderate pain. 05/23/16   Lyndal Pulley, MD  AFLURIA PRESERVATIVE FREE 0.5 ML SUSY inject 0.5 milliliter intramuscularly 01/11/16   Historical Provider, MD  AMBULATORY NON FORMULARY MEDICATION 1 Units by Other route daily. Left wrist splint 03/16/16   Donika K Patel, DO  aspirin EC 325 MG tablet Take 1 tablet (325 mg total) by mouth daily. 10/21/14   Nadara Mustard, MD  dextromethorphan-guaiFENesin [facility] DM) 30-600 MG 12hr tablet Take 1 tablet by mouth 2 (two) times daily as needed for cough. 05/23/16   Lyndal Pulley, MD  fluticasone [facility]) 50 MCG/ACT nasal spray Place 2 sprays into both nostrils daily. 04/06/15  [address] PA-C  gabapentin (NEURONTIN) 300 MG capsule take 1 capsule by mouth three times a day if needed for NERVE PAIN 02/29/16   Nadara Mustard, MD  ibuprofen (ADVIL,MOTRIN) 800 MG tablet Take 1 tablet (800 mg total) by mouth every 6 (six) hours as needed for mild pain or moderate pain. 05/23/16   Lyndal Pulley, MD  indomethacin (INDOCIN SR) 75 MG CR capsule take 1 capsule by mouth twice a day for inflammation 03/01/16   Historical Provider, MD  lisinopril-hydrochlorothiazide (PRINZIDE,ZESTORETIC) 10-12.5 MG tablet   02/29/16   Historical Provider, MD  lithium 600 MG capsule Take 600 mg by mouth at bedtime.    Historical Provider, MD  lithium carbonate (ESKALITH) 450 MG CR tablet Take 450 mg by mouth every morning. 01/11/16   Historical Provider, MD  mirtazapine (REMERON) 45 MG tablet Take 45 mg by mouth at bedtime.    Historical Provider, MD  naproxen (NAPROSYN) 500 MG tablet Take 1 tablet (500 mg total) by mouth 2 (two) times daily as needed for mild pain or moderate pain. With meals 04/27/16   Adonis Huguenin, NP  nitroGLYCERIN (NITRODUR - DOSED IN MG/24 HR) 0.2 mg/hr patch Place 1 patch (0.2 mg total) onto the skin daily. 04/27/16   Adonis Huguenin, NP  QUEtiapine (SEROQUEL XR) 400 MG 24 hr tablet Take 800 mg by mouth at bedtime.    Historical Provider, MD  simvastatin (ZOCOR) 20 MG tablet  02/29/16   Historical Provider, MD  tiZANidine (ZANAFLEX) 4 MG tablet Take 4 mg by mouth at bedtime as needed (for achilles).  01/11/16   Historical Provider, MD    Family History Family History  Problem Relation Age of Onset  . Hypertension Mother   . Stroke Mother   . Multiple sclerosis Sister   . Down syndrome Son     Social History Social History  Substance Use Topics  . Smoking status: Never Smoker  . Smokeless tobacco: Never Used  . Alcohol use No     Allergies   Patient has no known allergies.   Review of Systems Review of Systems 10 Systems reviewed and are negative for acute change except as noted in the HPI.   Physical Exam Updated Vital Signs BP 130/93   Pulse 94   Temp 97.8 F (36.6 C) (Oral)   Resp 16   SpO2 97%   Physical Exam  Constitutional: He appears well-developed and well-nourished. No distress.  Awake, alert and nontoxic in appearance  HENT:  Head: Normocephalic and atraumatic.  Right Ear: External ear normal.  Left Ear: External ear normal.  Mouth/Throat: Oropharynx is clear and moist.  Eyes: Conjunctivae and EOM are normal. Pupils are equal, round, and reactive to  light.  Neck: Normal range of motion. No JVD present.  Cardiovascular: Normal rate, regular rhythm and normal heart sounds.   Pulmonary/Chest: Effort normal. No stridor. No respiratory distress.  Mild crackles in left lower base and coarse breath sounds right lower base.   Abdominal: Soft. He exhibits no distension. There is no tenderness.  Musculoskeletal: Normal range of motion.  Neurological: He is alert.  Awake, alert, cooperative and aware of situation; no facial asymmetry; tongue midline; major cranial nerves appear intact;  baseline gait without new ataxia.  Skin: Skin is warm and dry. No rash noted. He is not diaphoretic.  Psychiatric: He has a normal mood and affect. His behavior is normal. Thought content normal.  Nursing note and vitals reviewed.  Vitals:   05/28/16  1021  BP: 130/93  Pulse: 94  Resp: 16  Temp: 97.8 F (36.6 C)  TempSrc: Oral  SpO2: 97%     ED Treatments / Results  DIAGNOSTIC STUDIES:  Oxygen Saturation is 97% on RA, normal by my interpretation.    COORDINATION OF CARE:  10:43 AM Discussed treatment plan with pt at bedside and pt agreed to plan.   Labs (all labs ordered are listed, but only abnormal results are displayed) Labs Reviewed - No data to display  EKG  EKG Interpretation None       Radiology No results found.  Procedures Procedures (including critical care time)  Medications Ordered in ED Medications - No data to display   Initial Impression / Assessment and Plan / ED Course  Due to exam findings will obtain chest x-ray. CXR Concerning for acute infiltrate. Treated for CAP. Rx Doxy. Pt will be discharged with instructions for further symptomatic treatment.  Follow up with PCP. Discussed return precautions.  Pt is hemodynamically stable & in NAD prior to discharge.  I have reviewed the triage vital signs and the nursing notes.  Pertinent labs & imaging results that were available during my care of the patient were  reviewed by me and considered in my medical decision making (see chart for details).       Final Clinical Impressions(s) / ED Diagnoses   Final diagnoses:  Community acquired pneumonia, unspecified laterality    New Prescriptions New Prescriptions   No medications on file   I personally performed the services described in this documentation, which was scribed in my presence. The recorded information has been reviewed and is accurate.     Joycie Peek, PA-C 05/28/16 1134    Gwyneth Sprout, MD 05/28/16 2052

## 2016-05-28 NOTE — Discharge Instructions (Signed)
Please take your medications as prescribed. You may also take OTC cough medications such as Robitussin or Delsym to help with your cough. Take all of your antibiotics as prescribed, do not save or share them. Be sure to stay well-hydrated, drink at least 8, 8 ounce glasses of water per day. Follow-up with your doctor next week. He will need to have a repeat chest x-ray done in 3-4 weeks to ensure resolution of symptoms. Return to ED for any new or worsening symptoms as we discussed.

## 2016-05-28 NOTE — ED Notes (Signed)
Patient transported to X-ray 

## 2016-06-01 NOTE — ED Notes (Signed)
The patient returned to ED today (06/01/16)  to request a work note to return to work on 06/03/16 from his visit on 05/28/16. I discussed this case with Dr Fredderick Phenix and verbal instructions were given to write a work note for the patient to return on 06/02/16.

## 2016-06-14 ENCOUNTER — Ambulatory Visit (INDEPENDENT_AMBULATORY_CARE_PROVIDER_SITE_OTHER): Payer: Medicaid Other | Admitting: Orthopedic Surgery

## 2016-06-14 ENCOUNTER — Encounter (INDEPENDENT_AMBULATORY_CARE_PROVIDER_SITE_OTHER): Payer: Self-pay

## 2016-06-14 DIAGNOSIS — M7662 Achilles tendinitis, left leg: Secondary | ICD-10-CM

## 2016-06-14 MED ORDER — PREDNISONE 10 MG PO TABS
20.0000 mg | ORAL_TABLET | Freq: Every day | ORAL | 0 refills | Status: DC
Start: 2016-06-14 — End: 2016-10-24

## 2016-06-14 NOTE — Progress Notes (Signed)
Office Visit Note   Patient: Justin [name]           Date of Birth: 19-Nov-1965           MRN: [id number] Visit Date: 06/14/2016              Requested by: [facility] [address] [place], [place] [address] PCP: [facility]  Chief Complaint  Patient presents with  . Left Ankle - Follow-up      HPI: Patient is status post open reduction internal fixation Achilles tendon rupture. Patient states that he still has pain. He has been in her fracture boot with a heel lift. He has tried anti-inflammatories and nitroglycerin patch without relief.  Assessment & Plan: Visit Diagnoses:  1. Achilles tendinitis, left leg     Plan: We will place the patient on a low-dose prednisone Dosepak. Continue with 2 heel lives in the fracture boot. Discussed the possibility of a cast but patient is not interested in a cast. Will follow up in 4 weeks. Do not feel that surgical intervention would be warranted at this time the Achilles tendon is palpable and intact.  Follow-Up Instructions: Return in about 4 weeks (around 07/12/2016).   Ortho Exam  Patient is alert, oriented, no adenopathy, well-dressed, normal affect, normal respiratory effort. Examination patient has an antalgic gait with a fracture boot on. With compression the calf reproduces plantarflexion of the foot he has dorsiflexion of the ankle of about 20. The Achilles tendon is in continuity to palpation. He does have some swelling in the mid substance of the Achilles there is no redness no  signs of infection. Patient has no hypersensitivity to light touch no evidence of dystrophy. The skin color and temperature is normal.  Imaging: No results found.  Labs: Lab Results  Component Value Date   REPTSTATUS 01/11/2013 FINAL 01/04/2013   CULT  01/04/2013    NO GROWTH 5 DAYS Performed at [facility]    Orders:  No orders of the defined types were placed in this encounter.  Meds ordered this encounter    Medications  . predniSONE (DELTASONE) 10 MG tablet    Sig: Take 2 tablets (20 mg total) by mouth daily with breakfast.    Dispense:  60 tablet    Refill:  0     Procedures: No procedures performed  Clinical Data: No additional findings.  ROS:  All other systems negative, except as noted in the HPI. Review of Systems  Objective: Vital Signs: There were no vitals taken for this visit.  Specialty Comments:  No specialty comments available.  PMFS History: Patient Active Problem List   Diagnosis Date Noted  . Achilles tendinitis, left leg 05/17/2016  . Obstructive sleep apnea on CPAP 03/07/2015  . Post-operative state 03/07/2015  . Achilles rupture, left 10/21/2014  . OSA (obstructive sleep apnea) 04/23/2013  . Bipolar disorder, unspecified 01/07/2013  . Sinus tachycardia 01/07/2013  . Headache(784.0) 01/07/2013  . SDH (subdural hematoma) (HCC) 01/04/2013  . H/O PE 01/04/2013  . Chronic anticoagulation 01/04/2013  . 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  . Acute sinusitis 01/04/2013   Past Medical History:  Diagnosis Date  . Achilles rupture, left   . Bipolar 1 disorder (HCC)   . Dental caries    periodontitis  . Pneumonia   . Pulmonary embolism (HCC)   . Sleep apnea    wears CPAP  . Subdural hematoma (HCC)     Family History  Problem Relation Age of  Onset  . Hypertension Mother   . Stroke Mother   . Multiple sclerosis Sister   . Down syndrome Son     Past Surgical History:  Procedure Laterality Date  . ACHILLES TENDON SURGERY Left 10/21/2014   Procedure: Left Achilles Reconstruction;  Surgeon: Nadara Mustard, MD;  [facility];  Service: Orthopedics;  Laterality: Left;  . APPENDECTOMY    . CRANIOTOMY N/A 01/19/2013   Procedure: CRANIOTOMY HEMATOMA EVACUATION SUBDURAL;  Surgeon: Hewitt Shorts, MD;  Location: [facility] NEURO ORS;  Service: Neurosurgery;  Laterality: N/A;  . CYSTECTOMY     right head  . ELBOW SURGERY     right  . FRACTURE SURGERY      finger  . MULTIPLE EXTRACTIONS WITH ALVEOLOPLASTY N/A 03/07/2015   Procedure: MULTIPLE EXTRACTION WITH ALVEOLOPLASTY;  Surgeon: [name], DDS;  Location: [facility] OR;  Service: Oral Surgery;  Laterality: N/A;   Social History   Occupational History  . disability    Social History Main Topics  . Smoking status: Never Smoker  . Smokeless tobacco: Never Used  . Alcohol use No  . Drug use: No  . Sexual activity: Not on file

## 2016-06-23 ENCOUNTER — Emergency Department (HOSPITAL_COMMUNITY)
Admission: EM | Admit: 2016-06-23 | Discharge: 2016-06-23 | Disposition: A | Discharge status: Home / Self Care | Payer: Medicaid Other | Attending: Emergency Medicine | Admitting: Emergency Medicine

## 2016-06-23 ENCOUNTER — Encounter (HOSPITAL_COMMUNITY): Payer: Self-pay | Admitting: Nurse Practitioner

## 2016-06-23 ENCOUNTER — Emergency Department (HOSPITAL_BASED_OUTPATIENT_CLINIC_OR_DEPARTMENT_OTHER): Admit: 2016-06-23 | Discharge: 2016-06-23 | Disposition: A | Discharge status: Home / Self Care | Payer: Medicaid Other

## 2016-06-23 DIAGNOSIS — M79609 Pain in unspecified limb: Secondary | ICD-10-CM

## 2016-06-23 DIAGNOSIS — M79604 Pain in right leg: Secondary | ICD-10-CM | POA: Diagnosis present

## 2016-06-23 LAB — CBC WITH DIFFERENTIAL/PLATELET
Basophils Absolute: 0 10*3/uL (ref 0.0–0.1)
Basophils Relative: 0 %
Eosinophils Absolute: 0.1 10*3/uL (ref 0.0–0.7)
Eosinophils Relative: 1 %
HCT: 41.4 % (ref 39.0–52.0)
HEMOGLOBIN: 13.7 g/dL (ref 13.0–17.0)
LYMPHS ABS: 4.1 10*3/uL — AB (ref 0.7–4.0)
Lymphocytes Relative: 39 %
MCH: 28.2 pg (ref 26.0–34.0)
MCHC: 33.1 g/dL (ref 30.0–36.0)
MCV: 85.2 fL (ref 78.0–100.0)
MONO ABS: 0.7 10*3/uL (ref 0.1–1.0)
MONOS PCT: 7 %
NEUTROS PCT: 53 %
Neutro Abs: 5.6 10*3/uL (ref 1.7–7.7)
Platelets: 290 10*3/uL (ref 150–400)
RBC: 4.86 MIL/uL (ref 4.22–5.81)
RDW: 14.4 % (ref 11.5–15.5)
WBC: 10.5 10*3/uL (ref 4.0–10.5)

## 2016-06-23 LAB — COMPREHENSIVE METABOLIC PANEL
ALK PHOS: 83 U/L (ref 38–126)
ALT: 18 U/L (ref 17–63)
AST: 17 U/L (ref 15–41)
Albumin: 4.4 g/dL (ref 3.5–5.0)
Anion gap: 9 (ref 5–15)
BILIRUBIN TOTAL: 0.4 mg/dL (ref 0.3–1.2)
BUN: 8 mg/dL (ref 6–20)
CALCIUM: 8.8 mg/dL — AB (ref 8.9–10.3)
CO2: 25 mmol/L (ref 22–32)
CREATININE: 1.26 mg/dL — AB (ref 0.61–1.24)
Chloride: 106 mmol/L (ref 101–111)
Glucose, Bld: 91 mg/dL (ref 65–99)
Potassium: 3.5 mmol/L (ref 3.5–5.1)
Sodium: 140 mmol/L (ref 135–145)
TOTAL PROTEIN: 7.4 g/dL (ref 6.5–8.1)

## 2016-06-23 LAB — PROTIME-INR
INR: 1.02
PROTHROMBIN TIME: 13.4 s (ref 11.4–15.2)

## 2016-06-23 MED ORDER — MORPHINE SULFATE (PF) 4 MG/ML IV SOLN
4.0000 mg | Freq: Once | INTRAVENOUS | Status: AC
  Administered 2016-06-23: 4 mg via INTRAVENOUS
  Filled 2016-06-23: qty 1

## 2016-06-23 MED ORDER — CYCLOBENZAPRINE HCL 10 MG PO TABS: 10.0000 mg | ORAL_TABLET | Freq: Two times a day (BID) | ORAL | 0 refills | Status: DC | PRN

## 2016-06-23 MED ORDER — HYDROCODONE-ACETAMINOPHEN 5-325 MG PO TABS: 1.0000 | ORAL_TABLET | ORAL | 0 refills | Status: DC | PRN

## 2016-06-23 NOTE — ED Provider Notes (Signed)
[facility]-EMERGENCY DEPT Provider Note   CSN: [id number] Arrival date & time: 06/23/16  1613     History   Chief Complaint Chief Complaint  Patient presents with  . Leg Pain    HPI Justin [name] is a 51 y.o. male with a past medical history significant for pulmonary embolisms not on anticoagulation secondary to prior subdural hematoma who presents with right leg pain and swelling. Patient says that he had no traumatic injuries. He said that for the last week, his right leg has gradually worsened in regards to pain. He says it is now a 10 out of 10 severity preventing him from walking due to discomfort. He says it is primarily in his calf going from his heel to his knee. He denies any history of this. He reports swelling that has worsened in the right leg. He says that he is having some tingling and numbness in the right toes due to this pain. He says that he has had multiple pulmonary embolisms but had to be taken off of blood thinners due to head bleed. He is unsure if he ever had a blood clot in his legs. He denies any fevers, chills, chest pain, shortness of breath, lightheadedness, nausea, vomiting, constipation, diarrhea, dysuria or any other symptoms. He denies any redness or erythema or skin injuries concerning for infection. He has a history of left Achilles tendon rupture but says this feels nothing like that. He denies any other complaints.     The history is provided by the patient.  Leg Pain   This is a new problem. The current episode started more than 2 days ago. The problem occurs constantly. The problem has been gradually worsening. The pain is present in the right lower leg. The quality of the pain is described as aching. The pain is at a severity of 10/10. The pain is severe. Associated symptoms include tingling. Pertinent negatives include no numbness and no stiffness. He has tried nothing for the symptoms. The treatment provided no relief. There has been no history of extremity  trauma.    Past Medical History:  Diagnosis Date  . Achilles rupture, left   . Bipolar 1 disorder (HCC)   . Dental caries    periodontitis  . Pneumonia   . Pulmonary embolism (HCC)   . Sleep apnea    wears CPAP  . Subdural hematoma [facility])     Patient Active Problem List   Diagnosis Date Noted  . Achilles tendinitis, left leg 05/17/2016  . Obstructive sleep apnea on CPAP 03/07/2015  . Post-operative state 03/07/2015  . Achilles rupture, left 10/21/2014  . OSA (obstructive sleep apnea) 04/23/2013  . Bipolar disorder, unspecified 01/07/2013  . Sinus tachycardia 01/07/2013  . Headache(784.0) 01/07/2013  . SDH (subdural hematoma) (HCC) 01/04/2013  . H/O PE 01/04/2013  . Chronic anticoagulation 01/04/2013  . 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  . Acute sinusitis 01/04/2013    Past Surgical History:  Procedure Laterality Date  . ACHILLES TENDON SURGERY Left 10/21/2014   Procedure: Left Achilles Reconstruction;  Surgeon: Nadara Mustard, MD;  Location: [facility] OR;  Service: Orthopedics;  Laterality: Left;  . APPENDECTOMY    . CRANIOTOMY N/A 01/19/2013   Procedure: CRANIOTOMY HEMATOMA EVACUATION SUBDURAL;  Surgeon: Hewitt Shorts, MD;  Location: [facility] NEURO ORS;  Service: Neurosurgery;  Laterality: N/A;  . CYSTECTOMY     right head  . ELBOW SURGERY     right  . FRACTURE SURGERY     finger  . MULTIPLE  EXTRACTIONS WITH ALVEOLOPLASTY N/A 03/07/2015   Procedure: MULTIPLE EXTRACTION WITH ALVEOLOPLASTY;  Surgeon: [name], DDS;  Location: [facility] OR;  Service: Oral Surgery;  Laterality: N/A;       Home Medications    Prior to Admission medications   Medication Sig Start Date End Date Taking? Authorizing Provider  acetaminophen (TYLENOL) 500 MG tablet Take 2 tablets (1,000 mg total) by mouth every 6 (six) hours as needed for mild pain or moderate pain. 05/23/16   Lyndal Pulley, MD  AFLURIA PRESERVATIVE FREE 0.5 ML SUSY inject 0.5 milliliter intramuscularly 01/11/16   Historical  Provider, MD  AMBULATORY NON FORMULARY MEDICATION 1 Units by Other route daily. Left wrist splint 03/16/16   Donika K Patel, DO  aspirin EC 325 MG tablet Take 1 tablet (325 mg total) by mouth daily. 10/21/14   Nadara Mustard, MD  dextromethorphan-guaiFENesin [facility] DM) 30-600 MG 12hr tablet Take 1 tablet by mouth 2 (two) times daily as needed for cough. 05/23/16   Lyndal Pulley, MD  doxycycline (VIBRAMYCIN) 100 MG capsule Take 1 capsule (100 mg total) by mouth 2 (two) times daily. One po bid x 7 days 05/28/16   Joycie Peek, PA-C  fluticasone [facility]) 50 MCG/ACT nasal spray Place 2 sprays into both nostrils daily. 04/06/15   Mercedes Street, PA-C  gabapentin (NEURONTIN) 300 MG capsule take 1 capsule by mouth three times a day if needed for NERVE PAIN 02/29/16   Nadara Mustard, MD  ibuprofen (ADVIL,MOTRIN) 800 MG tablet Take 1 tablet (800 mg total) by mouth every 6 (six) hours as needed for mild pain or moderate pain. 05/23/16   Lyndal Pulley, MD  indomethacin (INDOCIN SR) 75 MG CR capsule take 1 capsule by mouth twice a day for inflammation 03/01/16   Historical Provider, MD  lisinopril-hydrochlorothiazide (PRINZIDE,ZESTORETIC) 10-12.5 MG tablet  02/29/16   Historical Provider, MD  lithium 600 MG capsule Take 600 mg by mouth at bedtime.    Historical Provider, MD  lithium carbonate (ESKALITH) 450 MG CR tablet Take 450 mg by mouth every morning. 01/11/16   Historical Provider, MD  mirtazapine (REMERON) 45 MG tablet Take 45 mg by mouth at bedtime.    Historical Provider, MD  naproxen (NAPROSYN) 500 MG tablet Take 1 tablet (500 mg total) by mouth 2 (two) times daily as needed for mild pain or moderate pain. With meals 04/27/16   Adonis Huguenin, NP  nitroGLYCERIN (NITRODUR - DOSED IN MG/24 HR) 0.2 mg/hr patch Place 1 patch (0.2 mg total) onto the skin daily. 04/27/16   Adonis Huguenin, NP  predniSONE (DELTASONE) 10 MG tablet Take 2 tablets (20 mg total) by mouth daily with breakfast. 06/14/16   Nadara Mustard, MD    QUEtiapine (SEROQUEL XR) 400 MG 24 hr tablet Take 800 mg by mouth at bedtime.    Historical Provider, MD  simvastatin (ZOCOR) 20 MG tablet  02/29/16   Historical Provider, MD  tiZANidine (ZANAFLEX) 4 MG tablet Take 4 mg by mouth at bedtime as needed (for achilles).  01/11/16   Historical Provider, MD    Family History Family History  Problem Relation Age of Onset  . Hypertension Mother   . Stroke Mother   . Multiple sclerosis Sister   . Down syndrome Son     Social History Social History  Substance Use Topics  . Smoking status: Never Smoker  . Smokeless tobacco: Never Used  . Alcohol use No     Allergies   Patient has no known  allergies.   Review of Systems Review of Systems  Constitutional: Negative for activity change, chills, diaphoresis, fatigue and fever.  HENT: Negative for congestion and rhinorrhea.   Eyes: Negative for visual disturbance.  Respiratory: Negative for cough, chest tightness, shortness of breath, wheezing and stridor.   Cardiovascular: Negative for chest pain, palpitations and leg swelling.  Gastrointestinal: Negative for abdominal distention, abdominal pain, blood in stool, constipation, diarrhea, nausea and vomiting.  Genitourinary: Negative for difficulty urinating, dysuria, flank pain and frequency.  Musculoskeletal: Negative for back pain, gait problem and stiffness.  Skin: Negative for rash and wound.  Neurological: Positive for tingling. Negative for dizziness, weakness, light-headedness, numbness and headaches.  Psychiatric/Behavioral: Negative for agitation.  All other systems reviewed and are negative.    Physical Exam Updated Vital Signs BP (!) 138/104   Pulse 88   Temp 98.1 F (36.7 C) (Oral)   Resp 18   SpO2 100%   Physical Exam  Constitutional: He is oriented to person, place, and time. He appears well-developed and well-nourished. No distress.  HENT:  Head: Normocephalic and atraumatic.  Right Ear: External ear normal.   Left Ear: External ear normal.  Nose: Nose normal.  Mouth/Throat: Oropharynx is clear and moist. No oropharyngeal exudate.  Eyes: Conjunctivae and EOM are normal. Pupils are equal, round, and reactive to light.  Neck: Normal range of motion. Neck supple.  Cardiovascular: Normal rate, normal heart sounds and intact distal pulses.   No murmur heard. Pulmonary/Chest: Effort normal. No stridor. No respiratory distress. He has no wheezes. He exhibits no tenderness.  Abdominal: Soft. There is no tenderness. There is no rebound and no guarding.  Musculoskeletal: He exhibits tenderness. He exhibits no edema.       Right lower leg: He exhibits tenderness. He exhibits no bony tenderness, no swelling, no edema, no deformity and no laceration.       Legs: Normal sensation, strength, range of motion and pulses in bilateral legs.   Neurological: He is alert and oriented to person, place, and time. No cranial nerve deficit or sensory deficit. He exhibits normal muscle tone. Coordination normal.  Skin: Skin is warm. No rash noted. He is not diaphoretic. No erythema. No pallor.  Psychiatric: He has a normal mood and affect.  Nursing note and vitals reviewed.    ED Treatments / Results  Labs (all labs ordered are listed, but only abnormal results are displayed) Labs Reviewed  CBC WITH DIFFERENTIAL/PLATELET - Abnormal; Notable for the following:       Result Value   Lymphs Abs 4.1 (*)    All other components within normal limits  COMPREHENSIVE METABOLIC PANEL - Abnormal; Notable for the following:    Creatinine, Ser 1.26 (*)    Calcium 8.8 (*)    All other components within normal limits  PROTIME-INR    EKG  EKG Interpretation None       Radiology No results found.  Procedures Procedures (including critical care time)  Medications Ordered in ED Medications  morphine 4 MG/ML injection 4 mg (4 mg Intravenous Given 06/23/16 1739)     Initial Impression / Assessment and Plan / ED  Course  I have reviewed the triage vital signs and the nursing notes.  Pertinent labs & imaging results that were available during my care of the patient were reviewed by me and considered in my medical decision making (see chart for details).     SOMA LIZAK is a 51 y.o. male with a past medical history  significant for pulmonary embolisms not on anticoagulation secondary to prior subdural hematoma who presents with right leg pain and swelling.  History and exam are seen above. Patient had tenderness in his right posterior calf. Question possible slight right leg swelling. Normal pulses. Normal capillary refill. Normal strength and sensation. Patient reported some mild tingling on exam on the dorsal aspect of the right foot when his pain was severe. Normal knee range of motion and exam otherwise unremarkable. Lungs clear. Abdomen nontender.  Given patients history, clinical concern for DVT.  Ultrasound ordered an DVT study was negative.  Patient had other screening laboratory testing as seen above. Labs reassuring.  Patient had resolution of leg pain with pain medication. Suspect musculoskeletal pain as ultrasound was negative. No evidence of infection or cellulitis. Given lack of traumatic injuries, do not feel patient needs imaging at this time.  Patient given short course of pain medication and muscle relaxant. Patient instructed to follow up with PCP in the next several days for reevaluation. Patient had normal gait on reassessment and had normal sensation throughout the leg.  Patient understood return precautions for any new or worsening symptoms. Patient had other questions or concerns and patient was discharged in good condition.   Final Clinical Impressions(s) / ED Diagnoses   Final diagnoses:  Right leg pain    New Prescriptions Discharge Medication List as of 06/23/2016  9:04 PM    START taking these medications   Details  cyclobenzaprine (FLEXERIL) 10 MG tablet Take 1  tablet (10 mg total) by mouth 2 (two) times daily as needed for muscle spasms., Starting Sat 06/23/2016, Print    HYDROcodone-acetaminophen (NORCO/VICODIN) 5-325 MG tablet Take 1 tablet by mouth every 4 (four) hours as needed., Starting Sat 06/23/2016, Print       Clinical Impression: 1. Right leg pain     Disposition: Discharge  Condition: Good  I have discussed the results, Dx and Tx plan with the pt(& family if present). He/she/they expressed understanding and agree(s) with the plan. Discharge instructions discussed at great length. Strict return precautions discussed and pt &/or family have verbalized understanding of the instructions. No further questions at time of discharge.    Discharge Medication List as of 06/23/2016  9:04 PM    START taking these medications   Details  cyclobenzaprine (FLEXERIL) 10 MG tablet Take 1 tablet (10 mg total) by mouth 2 (two) times daily as needed for muscle spasms., Starting Sat 06/23/2016, Print    HYDROcodone-acetaminophen (NORCO/VICODIN) 5-325 MG tablet Take 1 tablet by mouth every 4 (four) hours as needed., Starting Sat 06/23/2016, Print        Follow Up: [facility] [address] [place] [address] [phone]  Schedule an appointment as soon as possible for a visit    [facility] EMERGENCY DEPARTMENT [address] [id number] mc [place] [address] [phone]  If symptoms worsen     Heide Scales, MD 06/24/16 1304

## 2016-06-23 NOTE — ED Notes (Signed)
Attempted PIV x 2. Access not yet established.  

## 2016-06-23 NOTE — Progress Notes (Signed)
VASCULAR LAB PRELIMINARY  PRELIMINARY  PRELIMINARY  PRELIMINARY  Right lower extremity venous duplex completed.    Preliminary report:  There is no DVT or SVT noted in the right lower extremity.  Called report to Dr. Cecelia Byars, [facility], RVT 06/23/2016, 7:14 PM

## 2016-06-23 NOTE — ED Notes (Signed)
Pt transported to vascular US 

## 2016-06-23 NOTE — ED Triage Notes (Addendum)
Pt presents with c/o numbness and pain to RLE. The symptoms began about 1 week ago. He began to have numbness and tingling in his toes at the beginning of the week and has since developed swelling and sharp shooting pain in his calf and posterior knee. The pain intensifies with walking, and become so severe at some points he has to stop walking. He denies any injuries. He tried ibuprofen, rest with no relief. He has a history of PE

## 2016-06-23 NOTE — Discharge Instructions (Signed)
We did not find evidence of a blood clot in your leg today. Please follow-up with your primary care physician for further management of her leg pain. If symptoms change or worsen or you appear to be developing an infection, please return to the nearest emergency department.

## 2016-07-09 ENCOUNTER — Encounter (INDEPENDENT_AMBULATORY_CARE_PROVIDER_SITE_OTHER): Payer: Self-pay

## 2016-07-09 ENCOUNTER — Ambulatory Visit (INDEPENDENT_AMBULATORY_CARE_PROVIDER_SITE_OTHER): Payer: Medicaid Other | Admitting: Orthopedic Surgery

## 2016-07-09 DIAGNOSIS — M7662 Achilles tendinitis, left leg: Secondary | ICD-10-CM

## 2016-07-09 NOTE — Progress Notes (Signed)
Office Visit Note   Patient: Justin [name]           Date of Birth: [birth year]           MRN: [id number] Visit Date: 07/09/2016              Requested by: [facility] [address] [place], [place] [address] PCP: [facility]  No chief complaint on file.     HPI: Patient presents in follow-up for left Achilles tendon reconstruction. Patient is still on 20 mg of prednisone a day to heel lifts in his fracture boot. Patient complains of pain in the anterior compartment pain in the posterior aspect of his calf. He states he went to the emergency room on 06/23/2016 concern for a DVT in the Doppler was negative for DVT. Patient states his symptoms were in his right leg. Patient complains of pain more proximally in his left leg at this time possibly due to using the fracture boot.  Assessment & Plan: Visit Diagnoses:  1. Achilles tendinitis, left leg     Plan: We will have him discontinue the fracture boot he'll complete his course of prednisone. Recommended against narcotics for treatment. We'll reevaluate in 4 weeks.  Follow-Up Instructions: Return in about 4 weeks (around 08/06/2016).   Ortho Exam  Patient is alert, oriented, no adenopathy, well-dressed, normal affect, normal respiratory effort. Examination patient does have an antalgic gait. He has no hypersensitivity to light touch no complex regional pain syndrome. She does have some venous stasis swelling in the left leg and his sock is creating a band around the Achilles and he has swelling Achilles proximal to where his sock is bunched up. He does have some tenderness to palpation of the anterior compartment but no pain with range of motion leg no signs of compartment syndrome. He does have some tenderness to palpation over the medial border of the tibia consistent with some periostitis. The posterior aspect of the calf is minimally tender to palpation there is no tenderness to palpation along the greater saphenous vein.  His foot is neurovascularly intact. There is some nodular changes of the Achilles but there is no defect. Patient has about 20 of dorsiflexion past neutral with his knee extended.  Imaging: No results found.  Labs: Lab Results  Component Value Date   REPTSTATUS 01/11/2013 FINAL 01/04/2013   CULT  01/04/2013    NO GROWTH 5 DAYS Performed at [facility]    Orders:  No orders of the defined types were placed in this encounter.  No orders of the defined types were placed in this encounter.    Procedures: No procedures performed  Clinical Data: No additional findings.  ROS:  All other systems negative, except as noted in the HPI. Review of Systems  Objective: Vital Signs: There were no vitals taken for this visit.  Specialty Comments:  No specialty comments available.  PMFS History: Patient Active Problem List   Diagnosis Date Noted  . Achilles tendinitis, left leg 05/17/2016  . Obstructive sleep apnea on CPAP 03/07/2015  . Post-operative state 03/07/2015  . Achilles rupture, left 10/21/2014  . OSA (obstructive sleep apnea) 04/23/2013  . Bipolar disorder, unspecified (HCC) 01/07/2013  . Sinus tachycardia 01/07/2013  . Headache(784.0) 01/07/2013  . SDH (subdural hematoma) (HCC) 01/04/2013  . H/O PE 01/04/2013  . Chronic anticoagulation 01/04/2013  . 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  . Acute sinusitis 01/04/2013   Past Medical History:  Diagnosis Date  . Achilles rupture,  left   . Bipolar 1 disorder (HCC)   . Dental caries    periodontitis  . Pneumonia   . Pulmonary embolism (HCC)   . Sleep apnea    wears CPAP  . Subdural hematoma (HCC)     Family History  Problem Relation Age of Onset  . Hypertension Mother   . Stroke Mother   . Multiple sclerosis Sister   . Down syndrome Son     Past Surgical History:  Procedure Laterality Date  . ACHILLES TENDON SURGERY Left 10/21/2014   Procedure: Left Achilles Reconstruction;  Surgeon:  Nadara Mustard, MD;  Location: [facility] OR;  Service: Orthopedics;  Laterality: Left;  . APPENDECTOMY    . CRANIOTOMY N/A 01/19/2013   Procedure: CRANIOTOMY HEMATOMA EVACUATION SUBDURAL;  Surgeon: Hewitt Shorts, MD;  Location: [facility] NEURO ORS;  Service: Neurosurgery;  Laterality: N/A;  . CYSTECTOMY     right head  . ELBOW SURGERY     right  . FRACTURE SURGERY     finger  . MULTIPLE EXTRACTIONS WITH ALVEOLOPLASTY N/A 03/07/2015   Procedure: MULTIPLE EXTRACTION WITH ALVEOLOPLASTY;  Surgeon: [name], DDS;  Location: [facility] OR;  Service: Oral Surgery;  Laterality: N/A;   Social History   Occupational History  . disability    Social History Main Topics  . Smoking status: Never Smoker  . Smokeless tobacco: Never Used  . Alcohol use No  . Drug use: No  . Sexual activity: Not on file

## 2016-08-06 ENCOUNTER — Ambulatory Visit (INDEPENDENT_AMBULATORY_CARE_PROVIDER_SITE_OTHER): Payer: Medicaid Other | Admitting: Orthopedic Surgery

## 2016-08-06 ENCOUNTER — Encounter (INDEPENDENT_AMBULATORY_CARE_PROVIDER_SITE_OTHER): Payer: Self-pay | Admitting: Orthopedic Surgery

## 2016-08-06 VITALS — Ht 69.0 in

## 2016-08-06 DIAGNOSIS — M7662 Achilles tendinitis, left leg: Secondary | ICD-10-CM

## 2016-08-06 NOTE — Progress Notes (Signed)
Office Visit Note   Patient: Justin [name]           Date of Birth: Aug 11, 1965           MRN: [id number] Visit Date: 08/06/2016              Requested by: [facility] [address] [place], [place] [address] PCP: [facility]  Chief Complaint  Patient presents with  . Left Ankle - Follow-up      HPI: Patient presents for follow-up 9 months status post left Achilles tendon reconstruction. He states he still has numbness around the incision he states his foot feels better out of the boot he is currently wearing regular sneakers. Patient also complains of some varicose veins.  Assessment & Plan: Visit Diagnoses:  1. Achilles tendinitis, left leg     Plan: Recommended compression stockings recommended scar massage.  Follow-Up Instructions: Return in about 4 weeks (around 09/03/2016).   Ortho Exam  Patient is alert, oriented, no adenopathy, well-dressed, normal affect, normal respiratory effort. Examination patient does have some fullness mid substance of the Achilles Reconstruction the Achilles tendon has good function he has good dorsiflexion and plantarflexion of the ankle. There is no tenderness to palpation. He has a slight amount of keloiding to the scar and the importance of scar massage was discussed. Patient does have decreased sensation around the scar which I discussed should resolve in about a year.  Imaging: No results found.  Labs: Lab Results  Component Value Date   REPTSTATUS 01/11/2013 FINAL 01/04/2013   CULT  01/04/2013    NO GROWTH 5 DAYS Performed at [facility]    Orders:  No orders of the defined types were placed in this encounter.  No orders of the defined types were placed in this encounter.    Procedures: No procedures performed  Clinical Data: No additional findings.  ROS:  All other systems negative, except as noted in the HPI. Review of Systems  Objective: Vital Signs: Ht 5\' 9"  (1.753 m)   Specialty  Comments:  No specialty comments available.  PMFS History: Patient Active Problem List   Diagnosis Date Noted  . Achilles tendinitis, left leg 05/17/2016  . Obstructive sleep apnea on CPAP 03/07/2015  . Post-operative state 03/07/2015  . Achilles rupture, left 10/21/2014  . OSA (obstructive sleep apnea) 04/23/2013  . Bipolar disorder, unspecified (HCC) 01/07/2013  . Sinus tachycardia 01/07/2013  . Headache(784.0) 01/07/2013  . SDH (subdural hematoma) (HCC) 01/04/2013  . H/O PE 01/04/2013  . Chronic anticoagulation 01/04/2013  . 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  . Acute sinusitis 01/04/2013   Past Medical History:  Diagnosis Date  . Achilles rupture, left   . Bipolar 1 disorder (HCC)   . Dental caries    periodontitis  . Pneumonia   . Pulmonary embolism (HCC)   . Sleep apnea    wears CPAP  . Subdural hematoma (HCC)     Family History  Problem Relation Age of Onset  . Hypertension Mother   . Stroke Mother   . Multiple sclerosis Sister   . Down syndrome Son     Past Surgical History:  Procedure Laterality Date  . ACHILLES TENDON SURGERY Left 10/21/2014   Procedure: Left Achilles Reconstruction;  Surgeon: Nadara Mustard, MD;  [facility];  Service: Orthopedics;  Laterality: Left;  . APPENDECTOMY    . CRANIOTOMY N/A 01/19/2013   Procedure: CRANIOTOMY HEMATOMA EVACUATION SUBDURAL;  Surgeon: Hewitt Shorts, MD;  Location:  [facility]UR[facility] ORS;  Service: Neurosurgery;  Laterality: N/A;  . CYSTECTOMY     right head  . ELBOW SURGERY     right  . FRACTURE SURGERY     finger  . MULTIPLE EXTRACTIONS WITH ALVEOLOPLASTY N/A 03/07/2015   Procedure: MULTIPLE EXTRACTION WITH ALVEOLOPLASTY;  Surgeon: [name], DDS;  Location: [facility] OR;  Service: Oral Surgery;  Laterality: N/A;   Social History   Occupational History  . disability    Social History Main Topics  . Smoking status: Never Smoker  . Smokeless tobacco: Never Used  . Alcohol use No  . Drug use: No  .  Sexual activity: Not on file

## 2016-08-30 ENCOUNTER — Other Ambulatory Visit (INDEPENDENT_AMBULATORY_CARE_PROVIDER_SITE_OTHER): Payer: Self-pay | Admitting: Orthopedic Surgery

## 2016-09-03 ENCOUNTER — Encounter (INDEPENDENT_AMBULATORY_CARE_PROVIDER_SITE_OTHER): Payer: Self-pay | Admitting: Orthopedic Surgery

## 2016-09-03 ENCOUNTER — Ambulatory Visit (INDEPENDENT_AMBULATORY_CARE_PROVIDER_SITE_OTHER): Payer: Medicaid Other | Admitting: Orthopedic Surgery

## 2016-09-03 VITALS — Ht 69.0 in | Wt 245.0 lb

## 2016-09-03 DIAGNOSIS — M7662 Achilles tendinitis, left leg: Secondary | ICD-10-CM | POA: Diagnosis not present

## 2016-09-03 MED ORDER — LIDOCAINE HCL 1 % IJ SOLN
2.0000 mL | INTRAMUSCULAR | Status: AC | PRN
  Administered 2016-09-03: 2 mL

## 2016-09-03 MED ORDER — METHYLPREDNISOLONE ACETATE 40 MG/ML IJ SUSP
40.0000 mg | INTRAMUSCULAR | Status: AC | PRN
  Administered 2016-09-03: 40 mg via INTRA_ARTICULAR

## 2016-09-03 NOTE — Progress Notes (Signed)
Office Visit Note   Patient: Justin [name]           Date of Birth: [birth year]           MRN: [id number] Visit Date: 09/03/2016              Requested by: [facility] [address] [place], [place] [address] PCP: [facility]  Chief Complaint  Patient presents with  . Left Leg - Follow-up    9 1/2 months s/p left achilles recon      HPI: Patient is a 51 year old gentleman status post Achilles tendon reconstruction. Patient states he was unable to get compression stockings states he could not afford stockings. He has been instructed in scar massage she states he has shooting pain over the posterior medial aspect of the Achilles. Patient has tried patches prednisone topical anti-inflammatories without relief. Patient is currently wearing a heel lift in the opposite foot not using one on this foot.  Assessment & Plan: Visit Diagnoses:  1. Achilles tendinitis, left leg     Plan: Patient was given a 916 since heel lift. He underwent injection of the retro-Achilles bursa. Discussed risk of potential weakening of the Achilles with the injection.  Follow-Up Instructions: Return in about 4 weeks (around 10/01/2016).   Ortho Exam  Patient is alert, oriented, no adenopathy, well-dressed, normal affect, normal respiratory effort. Examination patient has an antalgic gait he has swelling at the area of the reconstruction. He is tender to palpation medially. There is no redness no cellulitis no open wound or drainage no signs of infection. Compression the calf reproduces plantarflexion of the foot. He has range of motion from 20 of dorsiflexion to about 40 of plantar flexion with good range of motion of the ankle.  Imaging: No results found.  Labs: Lab Results  Component Value Date   REPTSTATUS 01/11/2013 FINAL 01/04/2013   CULT  01/04/2013    NO GROWTH 5 DAYS Performed at [facility]    Orders:  No orders of the defined types were placed in this  encounter.  No orders of the defined types were placed in this encounter.    Procedures: Medium Joint Inj Date/Time: 09/03/2016 1:50 PM Performed by: DUDA, MARCUS V Authorized by: Nadara Mustard   Consent Given by:  Patient Site marked: the procedure site was marked   Timeout: prior to procedure the correct patient, procedure, and site was verified   Indications:  Pain and diagnostic evaluation Location:  Ankle Site:  L ankle Prep: patient was prepped and draped in usual sterile fashion   Needle Size:  22 G Needle Length:  1.5 inches Approach:  Posterior Ultrasound Guided: No   Fluoroscopic Guidance: No   Medications:  2 mL lidocaine 1 %; 40 mg methylPREDNISolone acetate 40 MG/ML Aspiration Attempted: No   Patient tolerance:  Patient tolerated the procedure well with no immediate complications    Clinical Data: No additional findings.  ROS:  All other systems negative, except as noted in the HPI. Review of Systems  Objective: Vital Signs: Ht 5\' 9"  (1.753 m)   Wt 245 lb (111.1 kg)   BMI 36.18 kg/m   Specialty Comments:  No specialty comments available.  PMFS History: Patient Active Problem List   Diagnosis Date Noted  . Achilles tendinitis, left leg 05/17/2016  . Obstructive sleep apnea on CPAP 03/07/2015  . Post-operative state 03/07/2015  . Achilles rupture, left 10/21/2014  . OSA (obstructive sleep apnea) 04/23/2013  .  Bipolar disorder, unspecified (HCC) 01/07/2013  . Sinus tachycardia 01/07/2013  . Headache(784.0) 01/07/2013  . SDH (subdural hematoma) (HCC) 01/04/2013  . H/O PE 01/04/2013  . Chronic anticoagulation 01/04/2013  . 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  . Acute sinusitis 01/04/2013   Past Medical History:  Diagnosis Date  . Achilles rupture, left   . Bipolar 1 disorder (HCC)   . Dental caries    periodontitis  . Pneumonia   . Pulmonary embolism (HCC)   . Sleep apnea    wears CPAP  . Subdural hematoma (HCC)     Family  History  Problem Relation Age of Onset  . Hypertension Mother   . Stroke Mother   . Multiple sclerosis Sister   . Down syndrome Son     Past Surgical History:  Procedure Laterality Date  . ACHILLES TENDON SURGERY Left 10/21/2014   Procedure: Left Achilles Reconstruction;  Surgeon: Nadara Mustard, MD;  Location: [facility] OR;  Service: Orthopedics;  Laterality: Left;  . APPENDECTOMY    . CRANIOTOMY N/A 01/19/2013   Procedure: CRANIOTOMY HEMATOMA EVACUATION SUBDURAL;  Surgeon: Hewitt Shorts, MD;  Location: [facility];  Service: Neurosurgery;  Laterality: N/A;  . CYSTECTOMY     right head  . ELBOW SURGERY     right  . FRACTURE SURGERY     finger  . MULTIPLE EXTRACTIONS WITH ALVEOLOPLASTY N/A 03/07/2015   Procedure: MULTIPLE EXTRACTION WITH ALVEOLOPLASTY;  Surgeon: [name], DDS;  Location: [facility] OR;  Service: Oral Surgery;  Laterality: N/A;   Social History   Occupational History  . disability    Social History Main Topics  . Smoking status: Never Smoker  . Smokeless tobacco: Never Used  . Alcohol use No  . Drug use: No  . Sexual activity: Not on file

## 2016-09-19 ENCOUNTER — Emergency Department (HOSPITAL_COMMUNITY)
Admission: EM | Admit: 2016-09-19 | Discharge: 2016-09-19 | Disposition: A | Discharge status: Home / Self Care | Payer: Medicaid Other | Attending: Emergency Medicine | Admitting: Emergency Medicine

## 2016-09-19 ENCOUNTER — Encounter (HOSPITAL_COMMUNITY): Payer: Self-pay | Admitting: Emergency Medicine

## 2016-09-19 ENCOUNTER — Emergency Department (HOSPITAL_COMMUNITY): Payer: Medicaid Other

## 2016-09-19 DIAGNOSIS — Z79899 Other long term (current) drug therapy: Secondary | ICD-10-CM | POA: Diagnosis not present

## 2016-09-19 DIAGNOSIS — M7662 Achilles tendinitis, left leg: Secondary | ICD-10-CM | POA: Diagnosis not present

## 2016-09-19 DIAGNOSIS — M25572 Pain in left ankle and joints of left foot: Secondary | ICD-10-CM | POA: Diagnosis present

## 2016-09-19 MED ORDER — ACETAMINOPHEN 325 MG PO TABS
650.0000 mg | ORAL_TABLET | Freq: Once | ORAL | Status: AC
  Administered 2016-09-19: 650 mg via ORAL
  Filled 2016-09-19: qty 2

## 2016-09-19 MED ORDER — IBUPROFEN 600 MG PO TABS: 600.0000 mg | ORAL_TABLET | Freq: Four times a day (QID) | ORAL | 0 refills | Status: DC | PRN

## 2016-09-19 MED ORDER — IBUPROFEN 400 MG PO TABS
600.0000 mg | ORAL_TABLET | Freq: Once | ORAL | Status: AC
  Administered 2016-09-19: 600 mg via ORAL
  Filled 2016-09-19: qty 1

## 2016-09-19 NOTE — ED Provider Notes (Signed)
[facility]-EMERGENCY DEPT Provider Note   CSN: [id number] Arrival date & time: 09/19/16  1315   By signing my name below, I, Soijett Blue, attest that this documentation has been prepared under the direction and in the presence of Audry Pili, PA-C Electronically Signed: Soijett Blue, ED Scribe. 09/19/16. 2:04 PM.  History   Chief Complaint Chief Complaint  Patient presents with  . Ankle Pain    HPI Justin [name] is a 51 y.o. male who presents to the Emergency Department complaining of left ankle pain x burning sensation onset 1 week ago. Pt reports associated numbness to left ankle. Pt has not tried any medications for the relief of his symptoms. Pt reports that his left ankle pain shoots to his left calf. He notes that his left ankle pain is worsened with ambulation. He states that he ruptured his left achilles with surgery completed by Dr. Lajoyce Corners in 2016. He denies color change, swelling, wound, and any other symptoms.  The history is provided by the patient. No language interpreter was used.    Past Medical History:  Diagnosis Date  . Achilles rupture, left   . Bipolar 1 disorder (HCC)   . Dental caries    periodontitis  . Pneumonia   . Pulmonary embolism (HCC)   . Sleep apnea    wears CPAP  . Subdural hematoma [facility])     Patient Active Problem List   Diagnosis Date Noted  . Achilles tendinitis, left leg 05/17/2016  . Obstructive sleep apnea on CPAP 03/07/2015  . Post-operative state 03/07/2015  . Achilles rupture, left 10/21/2014  . OSA (obstructive sleep apnea) 04/23/2013  . Bipolar disorder, unspecified (HCC) 01/07/2013  . Sinus tachycardia 01/07/2013  . Headache(784.0) 01/07/2013  . SDH (subdural hematoma) (HCC) 01/04/2013  . H/O PE 01/04/2013  . Chronic anticoagulation 01/04/2013  . 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  . Acute sinusitis 01/04/2013    Past Surgical History:  Procedure Laterality Date  . ACHILLES TENDON SURGERY Left 10/21/2014   Procedure:  Left Achilles Reconstruction;  Surgeon: Nadara Mustard, MD;  Location: [facility] OR;  Service: Orthopedics;  Laterality: Left;  . APPENDECTOMY    . CRANIOTOMY N/A 01/19/2013   Procedure: CRANIOTOMY HEMATOMA EVACUATION SUBDURAL;  Surgeon: Hewitt Shorts, MD;  Location: [facility];  Service: Neurosurgery;  Laterality: N/A;  . CYSTECTOMY     right head  . ELBOW SURGERY     right  . FRACTURE SURGERY     finger  . MULTIPLE EXTRACTIONS WITH ALVEOLOPLASTY N/A 03/07/2015   Procedure: MULTIPLE EXTRACTION WITH ALVEOLOPLASTY;  Surgeon: [name], DDS;  Location: [facility] OR;  Service: Oral Surgery;  Laterality: N/A;       Home Medications    Prior to Admission medications   Medication Sig Start Date End Date Taking? Authorizing Provider  acetaminophen (TYLENOL) 500 MG tablet Take 2 tablets (1,000 mg total) by mouth every 6 (six) hours as needed for mild pain or moderate pain. 05/23/16   Lyndal Pulley, MD  AFLURIA PRESERVATIVE FREE 0.5 ML SUSY inject 0.5 milliliter intramuscularly 01/11/16   [provider]  AMBULATORY NON FORMULARY MEDICATION 1 Units by Other route daily. Left wrist splint 03/16/16   Nita Sickle K, DO  aspirin EC 325 MG tablet Take 1 tablet (325 mg total) by mouth daily. 10/21/14   Nadara Mustard, MD  cyclobenzaprine (FLEXERIL) 10 MG tablet Take 1 tablet (10 mg total) by mouth 2 (two) times daily as needed for muscle spasms. 06/23/16   Tegeler,  Canary Brim, MD  dextromethorphan-guaiFENesin [facility] DM) 30-600 MG 12hr tablet Take 1 tablet by mouth 2 (two) times daily as needed for cough. 05/23/16   Lyndal Pulley, MD  doxycycline (VIBRAMYCIN) 100 MG capsule Take 1 capsule (100 mg total) by mouth 2 (two) times daily. One po bid x 7 days 05/28/16   Joycie Peek, PA-C  fluticasone [facility]) 50 MCG/ACT nasal spray Place 2 sprays into both nostrils daily. 04/06/15   Street, Timblin, PA-C  gabapentin (NEURONTIN) 300 MG capsule take 1 capsule by mouth three times a day if needed for NERVE  PAIN 02/29/16   Nadara Mustard, MD  HYDROcodone-acetaminophen (NORCO/VICODIN) 5-325 MG tablet Take 1 tablet by mouth every 4 (four) hours as needed. 06/23/16   Tegeler, Canary Brim, MD  IBU 600 MG tablet take 1 tablet by mouth every 6 hours if needed 08/30/16   Nadara Mustard, MD  ibuprofen (ADVIL,MOTRIN) 800 MG tablet Take 1 tablet (800 mg total) by mouth every 6 (six) hours as needed for mild pain or moderate pain. 05/23/16   Lyndal Pulley, MD  indomethacin (INDOCIN SR) 75 MG CR capsule take 1 capsule by mouth twice a day for inflammation 03/01/16   [provider]  lisinopril-hydrochlorothiazide (PRINZIDE,ZESTORETIC) 10-12.5 MG tablet  02/29/16   [provider]  lithium 600 MG capsule Take 600 mg by mouth at bedtime.    [provider]  lithium carbonate (ESKALITH) 450 MG CR tablet Take 450 mg by mouth every morning. 01/11/16   [provider]  mirtazapine (REMERON) 45 MG tablet Take 45 mg by mouth at bedtime.    [provider]  naproxen (NAPROSYN) 500 MG tablet Take 1 tablet (500 mg total) by mouth 2 (two) times daily as needed for mild pain or moderate pain. With meals 04/27/16   Adonis Huguenin, NP  nitroGLYCERIN (NITRODUR - DOSED IN MG/24 HR) 0.2 mg/hr patch Place 1 patch (0.2 mg total) onto the skin daily. 04/27/16   Adonis Huguenin, NP  predniSONE (DELTASONE) 10 MG tablet Take 2 tablets (20 mg total) by mouth daily with breakfast. 06/14/16   Nadara Mustard, MD  QUEtiapine (SEROQUEL XR) 400 MG 24 hr tablet Take 800 mg by mouth at bedtime.    [provider]  simvastatin (ZOCOR) 20 MG tablet  02/29/16   [provider]  tiZANidine (ZANAFLEX) 4 MG tablet Take 4 mg by mouth at bedtime as needed (for achilles).  01/11/16   [provider]    Family History Family History  Problem Relation Age of Onset  . Hypertension Mother   . Stroke Mother   . Multiple sclerosis Sister   . Down syndrome Son     Social History Social  History  Substance Use Topics  . Smoking status: Never Smoker  . Smokeless tobacco: Never Used  . Alcohol use No     Allergies   Patient has no known allergies.   Review of Systems Review of Systems A complete 10 system review of systems was obtained and all systems are negative except as noted in the HPI and PMH.   Physical Exam Updated Vital Signs BP (!) 135/99 (BP Location: Right Arm)   Pulse 93   Temp 98.3 F (36.8 C) (Oral)   Resp 17   Ht 5\' 9"  (1.753 m)   Wt 230 lb (104.3 kg)   SpO2 99%   BMI 33.97 kg/m   Physical Exam  Constitutional: He is oriented to person, place, and time.  Vital signs are normal. He appears well-developed and well-nourished. No distress.  HENT:  Head: Normocephalic and atraumatic.  Right Ear: Hearing normal.  Left Ear: Hearing normal.  Eyes: Conjunctivae and EOM are normal. Pupils are equal, round, and reactive to light.  Neck: Neck supple.  Cardiovascular: Normal rate and regular rhythm.   Pulmonary/Chest: Effort normal. No respiratory distress.  Abdominal: He exhibits no distension.  Musculoskeletal: Normal range of motion.       Left ankle: He exhibits normal range of motion. Achilles tendon exhibits pain.  Left ankle: TTP along insertion of achilles tendon. Tendon appears intact. ROM intact. Dorsi and plantar flexion intact.   Neurological: He is alert and oriented to person, place, and time.  Skin: Skin is warm and dry.  Psychiatric: He has a normal mood and affect. His speech is normal and behavior is normal. Thought content normal.  Nursing note and vitals reviewed.   ED Treatments / Results  DIAGNOSTIC STUDIES: Oxygen Saturation is 99% on RA, nl by my interpretation.    COORDINATION OF CARE: 2:03 PM Discussed treatment plan with pt at bedside which includes left ankle xray and pt agreed to plan.   Labs (all labs ordered are listed, but only abnormal results are displayed) Labs Reviewed - No data to display  EKG  EKG  Interpretation None       Radiology Dg Ankle Complete Left  Result Date: 09/19/2016 CLINICAL DATA:  Left ankle pain and burning. EXAM: LEFT ANKLE COMPLETE - 3+ VIEW COMPARISON:  03/31/2015 FINDINGS: Spurring and small osteophytes along the distal tibia. The left ankle is located without a fracture. No significant soft tissue swelling. Alignment of the ankle is normal. IMPRESSION: No acute bone abnormality. Degenerative changes at the left ankle joint. Electronically Signed   By: Richarda Overlie M.D.   On: 09/19/2016 14:28    Procedures Procedures (including critical care time)  Medications Ordered in ED Medications - No data to display   Initial Impression / Assessment and Plan / ED Course  I have reviewed the triage vital signs and the nursing notes.  Pertinent labs & imaging results that were available during my care of the patient were reviewed by me and considered in my medical decision making (see chart for details).  I have reviewed and evaluated the relevant imaging studies. I have reviewed the relevant previous healthcare records. I obtained HPI from historian.  ED Course:  Assessment: Patient is a 51 y.o. male who presents with left achilles pain. X-Ray negative for obvious fracture or dislocation. Likely achilles tendonitis. Pt advised to follow up with orthopedics. Patient given brace and crutches while in ED, conservative therapy recommended and discussed. Patient will be discharged home & is agreeable with above plan. Returns precautions discussed. Pt appears safe for discharge.  Disposition/Plan:  DC Home Additional Verbal discharge instructions given and discussed with patient.  Pt Instructed to f/u with Ortho in the next week for evaluation and treatment of symptoms. Return precautions given Pt acknowledges and agrees with plan  Supervising Physician Long, Arlyss Repress, MD  Final Clinical Impressions(s) / ED Diagnoses   Final diagnoses:  Achilles tendinitis of left  lower extremity    New Prescriptions New Prescriptions   No medications on file   I personally performed the services described in this documentation, which was scribed in my presence. The recorded information has been reviewed and is accurate.    Audry Pili, PA-C 09/19/16 1436    Long, Arlyss Repress, MD 09/20/16 [phone]

## 2016-09-19 NOTE — ED Triage Notes (Addendum)
Pt sts left ankle pain x 3 days with tightness; pt sts thinks he injured his tendon; pt sts painful to walk and move

## 2016-09-19 NOTE — Progress Notes (Signed)
Orthopedic Tech Progress Note Patient Details:  Justin [name] 21-Mar-1965 [id number]  Ortho Devices Type of Ortho Device: ASO, Crutches Ortho Device/Splint Location: LLE Ortho Device/Splint Interventions: Ordered, Application, Adjustment   Jennye Moccasin 09/19/2016, 2:47 PM

## 2016-09-19 NOTE — Discharge Instructions (Signed)
Please read and follow all provided instructions.  Your diagnoses today include:  1. Achilles tendinitis of left lower extremity     Tests performed today include: Vital signs. See below for your results today.   Medications prescribed:  Take as prescribed   Home care instructions:  Follow any educational materials contained in this packet.  Follow-up instructions: Please follow-up with your Orthopedic provider for further evaluation of symptoms and treatment   Return instructions:  Please return to the Emergency Department if you do not get better, if you get worse, or new symptoms OR  - Fever (temperature greater than 101.55F)  - Bleeding that does not stop with holding pressure to the area    -Severe pain (please note that you may be more sore the day after your accident)  - Chest Pain  - Difficulty breathing  - Severe nausea or vomiting  - Inability to tolerate food and liquids  - Passing out  - Skin becoming red around your wounds  - Change in mental status (confusion or lethargy)  - New numbness or weakness    Please return if you have any other emergent concerns.  Additional Information:  Your vital signs today were: BP (!) 135/99 (BP Location: Right Arm)    Pulse 93    Temp 98.3 F (36.8 C) (Oral)    Resp 17    Ht 5\' 9"  (1.753 m)    Wt 104.3 kg (230 lb)    SpO2 99%    BMI 33.97 kg/m  If your blood pressure (BP) was elevated above 135/85 this visit, please have this repeated by your doctor within one month. ---------------

## 2016-10-01 ENCOUNTER — Ambulatory Visit (INDEPENDENT_AMBULATORY_CARE_PROVIDER_SITE_OTHER): Payer: Medicaid Other | Admitting: Family

## 2016-10-01 DIAGNOSIS — M7662 Achilles tendinitis, left leg: Secondary | ICD-10-CM

## 2016-10-01 DIAGNOSIS — E11610 Type 2 diabetes mellitus with diabetic neuropathic arthropathy: Secondary | ICD-10-CM

## 2016-10-01 MED ORDER — MELOXICAM 15 MG PO TABS: 15.0000 mg | ORAL_TABLET | Freq: Every day | ORAL | 1 refills | Status: DC

## 2016-10-01 NOTE — Progress Notes (Signed)
Office Visit Note   Patient: Justin [name]           Date of Birth: Aug 12, 1965           MRN: [id number] Visit Date: 10/01/2016              Requested by: [facility] [address] [place], [place] [address] PCP: [facility], [facility]  Chief Complaint  Patient presents with  . Left Ankle - Follow-up, Pain      HPI: Patient is a 51 year old gentleman status post Achilles tendon reconstruction nearly 2 years ago.   Patient states he was unable to get compression stockings states he could not afford stockings. He has been instructed in scar massage previously was unable to do due to pain.   he states he has shooting pain over the posterior medial aspect of the Achilles. Patient has tried patches prednisone topical anti-inflammatories without relief.   Patient is currently wearing a heel lift in the opposite foot not using one on this foot.  Did have a Depomedrol injection to retroachilles bursa at last clinic visit. States provided no relief. "i cannot live like this." had an ED visit for this pain over the weekend. Complains of pain with dorsiflexion.  Assessment & Plan: Visit Diagnoses:  1. Diabetic neurogenic arthropathy (HCC)   2. Achilles tendinitis, left leg     Plan: Patient was again given a 9/16 since heel lift for the left shoe. Will d/c the ibuprofen and provided rx for mobic. Will order mri of left ankle to rule out a partial tear.  Follow-Up Instructions: Return in about 4 weeks (around 10/29/2016).   Ortho Exam  Patient is alert, oriented, no adenopathy, well-dressed, normal affect, normal respiratory effort. Examination patient has an antalgic gait he has swelling at the area of the reconstruction. He is tender to palpation medially. Significant scar tissue present. There is no redness no cellulitis no open wound or drainage no signs of infection. Compression the calf reproduces plantarflexion of the foot. Good range of motion of the ankle.  Imaging: No  results found.  Labs: Lab Results  Component Value Date   REPTSTATUS 01/11/2013 FINAL 01/04/2013   CULT  01/04/2013    NO GROWTH 5 DAYS Performed at [facility]    Orders:  No orders of the defined types were placed in this encounter.  Meds ordered this encounter  Medications  . meloxicam (MOBIC) 15 MG tablet    Sig: Take 1 tablet (15 mg total) by mouth daily.    Dispense:  30 tablet    Refill:  1     Procedures: No procedures performed  Clinical Data: No additional findings.  ROS:  All other systems negative, except as noted in the HPI. Review of Systems  Constitutional: Negative for chills and fever.  Cardiovascular: Negative for leg swelling.  Musculoskeletal: Positive for myalgias.  Skin: Negative for color change.    Objective: Vital Signs: There were no vitals taken for this visit.  Specialty Comments:  No specialty comments available.  PMFS History: Patient Active Problem List   Diagnosis Date Noted  . Achilles tendinitis, left leg 05/17/2016  . Obstructive sleep apnea on CPAP 03/07/2015  . Post-operative state 03/07/2015  . Achilles rupture, left 10/21/2014  . OSA (obstructive sleep apnea) 04/23/2013  . Bipolar disorder, unspecified (HCC) 01/07/2013  . Sinus tachycardia 01/07/2013  . Headache(784.0) 01/07/2013  . SDH (subdural hematoma) (HCC) 01/04/2013  . H/O PE 01/04/2013  . Chronic anticoagulation  01/04/2013  . 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  . Acute sinusitis 01/04/2013   Past Medical History:  Diagnosis Date  . Achilles rupture, left   . Bipolar 1 disorder (HCC)   . Dental caries    periodontitis  . Pneumonia   . Pulmonary embolism (HCC)   . Sleep apnea    wears CPAP  . Subdural hematoma (HCC)     Family History  Problem Relation Age of Onset  . Hypertension Mother   . Stroke Mother   . Multiple sclerosis Sister   . Down syndrome Son     Past Surgical History:  Procedure Laterality Date  . ACHILLES  TENDON SURGERY Left 10/21/2014   Procedure: Left Achilles Reconstruction;  Surgeon: Nadara Mustard, MD;  Location: [facility];  Service: Orthopedics;  Laterality: Left;  . APPENDECTOMY    . CRANIOTOMY N/A 01/19/2013   Procedure: CRANIOTOMY HEMATOMA EVACUATION SUBDURAL;  Surgeon: Hewitt Shorts, MD;  Location: [facility];  Service: Neurosurgery;  Laterality: N/A;  . CYSTECTOMY     right head  . ELBOW SURGERY     right  . FRACTURE SURGERY     finger  . MULTIPLE EXTRACTIONS WITH ALVEOLOPLASTY N/A 03/07/2015   Procedure: MULTIPLE EXTRACTION WITH ALVEOLOPLASTY;  Surgeon: [name], DDS;  Location: [facility] OR;  Service: Oral Surgery;  Laterality: N/A;   Social History   Occupational History  . disability    Social History Main Topics  . Smoking status: Never Smoker  . Smokeless tobacco: Never Used  . Alcohol use No  . Drug use: No  . Sexual activity: Not on file

## 2016-10-20 ENCOUNTER — Ambulatory Visit
Admission: RE | Admit: 2016-10-20 | Discharge: 2016-10-20 | Disposition: A | Discharge status: Home / Self Care | Payer: Medicaid Other | Source: Ambulatory Visit | Attending: Family | Admitting: Family

## 2016-10-20 DIAGNOSIS — M7662 Achilles tendinitis, left leg: Secondary | ICD-10-CM

## 2016-10-24 ENCOUNTER — Telehealth (INDEPENDENT_AMBULATORY_CARE_PROVIDER_SITE_OTHER): Payer: Self-pay | Admitting: Orthopedic Surgery

## 2016-10-24 ENCOUNTER — Encounter (INDEPENDENT_AMBULATORY_CARE_PROVIDER_SITE_OTHER): Payer: Self-pay | Admitting: Family

## 2016-10-24 ENCOUNTER — Ambulatory Visit (INDEPENDENT_AMBULATORY_CARE_PROVIDER_SITE_OTHER): Payer: Medicaid Other | Admitting: Family

## 2016-10-24 VITALS — Ht 69.0 in | Wt 230.0 lb

## 2016-10-24 DIAGNOSIS — M7662 Achilles tendinitis, left leg: Secondary | ICD-10-CM | POA: Diagnosis not present

## 2016-10-24 MED ORDER — DICLOFENAC SODIUM 50 MG PO TBEC: 50.0000 mg | DELAYED_RELEASE_TABLET | Freq: Two times a day (BID) | ORAL | 2 refills | Status: DC

## 2016-10-24 MED ORDER — LIDO-CAPSAICIN-MEN-METHYL SAL 4-0.0375-5-20 % EX PTCH: 1.0000 | MEDICATED_PATCH | Freq: Every morning | CUTANEOUS | 0 refills | Status: DC

## 2016-10-24 MED ORDER — NITROGLYCERIN 0.2 MG/HR TD PT24: 0.2000 mg | MEDICATED_PATCH | Freq: Every day | TRANSDERMAL | 2 refills | Status: DC

## 2016-10-24 NOTE — Progress Notes (Signed)
Office Visit Note   Patient: Justin [name]           Date of Birth: [birth year]           MRN: [id number] Visit Date: 10/24/2016              Requested by: [facility] [address] [place], [place] [address] PCP: [facility]  Chief Complaint  Patient presents with  . Left Foot - Follow-up    MRI review left ankle      HPI: Patient is a 51 year old gentleman status post Achilles tendon reconstruction nearly 2 years ago. Has had issues with achilles pain on left since.   Patient states he was unable to get compression stockings states he could not afford stockings. He has been instructed in scar massage previously was unable to do due to pain.   he states he has shooting pain over the posterior medial aspect of the Achilles. Patient has tried nitro patches, prednisone topical anti-inflammatories without relief.   Did have a Depomedrol injection to retroachilles bursa 2 months ago. States provided no relief. "i cannot live like this." had an ED visit for this pain last month. Complains of pain with dorsiflexion.  Was advised to wear 2 heel lifts in regular shoe wear at last visit. States this makes his pain worse. However relates having the same pain whether he wears lifts or not complains that no treatments have worked. Today is here for MRI review which showed moderate to moderately severe non-insertional Achilles tendinopathy. There was no tear.  Assessment & Plan: Visit Diagnoses:  No diagnosis found.  Plan: We will renew his anti-inflammatories and nitroglycerin wasn't patches. We'll send a prescription for lidocaine patch to trial this for pain. Advised him to get back into his fracture boot with 2 9/16 heel lifts. States he has both of these at home.  Follow-Up Instructions: No Follow-up on file.   Ortho Exam  Patient is alert, oriented, no adenopathy, well-dressed, normal affect, normal respiratory effort. Examination patient has an antalgic gait he has  palpable scar tissue and tenderness at the area of the reconstruction. He is tender to palpation medially. There is no redness no cellulitis no open wound or drainage no signs of infection. Compression the calf reproduces plantarflexion of the foot. Good range of motion of the ankle.  Imaging: No results found.  Labs: Lab Results  Component Value Date   REPTSTATUS 01/11/2013 FINAL 01/04/2013   CULT  01/04/2013    NO GROWTH 5 DAYS Performed at [facility]    Orders:  No orders of the defined types were placed in this encounter.  Meds ordered this encounter  Medications  . DISCONTD: Lido-Capsaicin-Men-Methyl Sal (1ST MEDX-PATCH/ LIDOCAINE) 4-0.373-08-05 % PT[facility]    Sig: Apply 1 patch topically every morning.    Dispense:  30 patch    Refill:  0  . Lido-Capsaicin-Men-Methyl Sal (1ST MEDX-PATCH/ LIDOCAINE) 4-0.373-08-05 % [facility]    Sig: Apply 1 patch topically every morning.    Dispense:  30 patch    Refill:  0  . nitroGLYCERIN (NITRODUR - DOSED IN MG/24 HR) 0.2 mg/hr patch    Sig: Place 1 patch (0.2 mg total) onto the skin daily.    Dispense:  30 patch    Refill:  2     Procedures: No procedures performed  Clinical Data: No additional findings.  ROS:  All other systems negative, except as noted in the HPI. Review of Systems  Constitutional:  Negative for chills and fever.  Cardiovascular: Negative for leg swelling.  Musculoskeletal: Positive for myalgias.  Skin: Negative for color change.    Objective: Vital Signs: Ht 5\' 9"  (1.753 m)   Wt 230 lb (104.3 kg)   BMI 33.97 kg/m   Specialty Comments:  No specialty comments available.  PMFS History: Patient Active Problem List   Diagnosis Date Noted  . Achilles tendinitis, left leg 05/17/2016  . Obstructive sleep apnea on CPAP 03/07/2015  . Post-operative state 03/07/2015  . Achilles rupture, left 10/21/2014  . OSA (obstructive sleep apnea) 04/23/2013  . Bipolar disorder, unspecified (HCC) 01/07/2013  .  Sinus tachycardia 01/07/2013  . Headache(784.0) 01/07/2013  . SDH (subdural hematoma) (HCC) 01/04/2013  . H/O PE 01/04/2013  . Chronic anticoagulation 01/04/2013  . 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  . Acute sinusitis 01/04/2013   Past Medical History:  Diagnosis Date  . Achilles rupture, left   . Bipolar 1 disorder (HCC)   . Dental caries    periodontitis  . Pneumonia   . Pulmonary embolism (HCC)   . Sleep apnea    wears CPAP  . Subdural hematoma (HCC)     Family History  Problem Relation Age of Onset  . Hypertension Mother   . Stroke Mother   . Multiple sclerosis Sister   . Down syndrome Son     Past Surgical History:  Procedure Laterality Date  . ACHILLES TENDON SURGERY Left 10/21/2014   Procedure: Left Achilles Reconstruction;  Surgeon: Nadara Mustard, MD;  Location: [facility] OR;  Service: Orthopedics;  Laterality: Left;  . APPENDECTOMY    . CRANIOTOMY N/A 01/19/2013   Procedure: CRANIOTOMY HEMATOMA EVACUATION SUBDURAL;  Surgeon: Hewitt Shorts, MD;  Location: [facility];  Service: Neurosurgery;  Laterality: N/A;  . CYSTECTOMY     right head  . ELBOW SURGERY     right  . FRACTURE SURGERY     finger  . MULTIPLE EXTRACTIONS WITH ALVEOLOPLASTY N/A 03/07/2015   Procedure: MULTIPLE EXTRACTION WITH ALVEOLOPLASTY;  Surgeon: [name], DDS;  Location: [facility] OR;  Service: Oral Surgery;  Laterality: N/A;   Social History   Occupational History  . disability    Social History Main Topics  . Smoking status: Never Smoker  . Smokeless tobacco: Never Used  . Alcohol use No  . Drug use: No  . Sexual activity: Not on file

## 2016-10-24 NOTE — Telephone Encounter (Signed)
Called patient left message appointment R/S with Dr Lajoyce Corners 11-22-16 at 8:45am

## 2016-10-25 ENCOUNTER — Telehealth (INDEPENDENT_AMBULATORY_CARE_PROVIDER_SITE_OTHER): Payer: Self-pay | Admitting: Family

## 2016-10-25 MED ORDER — NAPROXEN 500 MG PO TABS: 500.0000 mg | ORAL_TABLET | Freq: Two times a day (BID) | ORAL | 0 refills | Status: DC

## 2016-10-25 NOTE — Addendum Note (Signed)
Addended by: Barnie Del R on: 10/25/2016 04:40 PM   Modules accepted: Orders

## 2016-10-25 NOTE — Telephone Encounter (Signed)
Please see below and advise.

## 2016-10-25 NOTE — Telephone Encounter (Signed)
Patient called asking for another type of pain medication that is covered by his insurance. CB # [phone]

## 2016-10-26 ENCOUNTER — Telehealth (INDEPENDENT_AMBULATORY_CARE_PROVIDER_SITE_OTHER): Payer: Self-pay | Admitting: Family

## 2016-10-26 NOTE — Telephone Encounter (Signed)
I called to advise the pt that this has been done. Naproxen per pt report does not work I want something different. I advised that we tried the diclofenac and that this was not covered by his insurance and then then the pt disconnected the call.

## 2016-10-26 NOTE — Telephone Encounter (Signed)
Patient called asked for a call back concerning his Rx. The number to contact patient is [phone]

## 2016-10-26 NOTE — Telephone Encounter (Signed)
See previous message. This has been done.  

## 2016-10-29 ENCOUNTER — Ambulatory Visit (INDEPENDENT_AMBULATORY_CARE_PROVIDER_SITE_OTHER): Payer: Medicaid Other | Admitting: Family

## 2016-11-05 ENCOUNTER — Ambulatory Visit (INDEPENDENT_AMBULATORY_CARE_PROVIDER_SITE_OTHER): Payer: Medicaid Other | Admitting: Pulmonary Disease

## 2016-11-05 ENCOUNTER — Encounter: Payer: Self-pay | Admitting: Pulmonary Disease

## 2016-11-05 VITALS — BP 122/80 | HR 79 | Ht 69.0 in | Wt 235.4 lb

## 2016-11-05 DIAGNOSIS — G4733 Obstructive sleep apnea (adult) (pediatric): Secondary | ICD-10-CM | POA: Diagnosis not present

## 2016-11-05 DIAGNOSIS — Z9989 Dependence on other enabling machines and devices: Secondary | ICD-10-CM | POA: Diagnosis not present

## 2016-11-05 NOTE — Progress Notes (Signed)
Current Outpatient Prescriptions on File Prior to Visit  Medication Sig  . lithium 600 MG capsule Take 600 mg by mouth at bedtime.  Marland Kitchen lithium carbonate (ESKALITH) 450 MG CR tablet Take 450 mg by mouth every morning.  . mirtazapine (REMERON) 45 MG tablet Take 45 mg by mouth at bedtime.  Marland Kitchen QUEtiapine (SEROQUEL XR) 400 MG 24 hr tablet Take 800 mg by mouth at bedtime.  Marland Kitchen AFLURIA PRESERVATIVE FREE 0.5 ML SUSY inject 0.5 milliliter intramuscularly  . aspirin EC 325 MG tablet Take 1 tablet (325 mg total) by mouth daily. (Patient not taking: Reported on 11/05/2016)  . cyclobenzaprine (FLEXERIL) 10 MG tablet Take 1 tablet (10 mg total) by mouth 2 (two) times daily as needed for muscle spasms. (Patient not taking: Reported on 11/05/2016)  . dextromethorphan-guaiFENesin (MUCINEX DM) 30-600 MG 12hr tablet Take 1 tablet by mouth 2 (two) times daily as needed for cough. (Patient not taking: Reported on 11/05/2016)  . nitroGLYCERIN (NITRODUR - DOSED IN MG/24 HR) 0.2 mg/hr patch Place 1 patch (0.2 mg total) onto the skin daily. (Patient not taking: Reported on 11/05/2016)   No current facility-administered medications on file prior to visit.      Chief Complaint  Patient presents with  . Follow-up    Pt states that he has been doing good on CPAP but is occ. waking up at night. DME: [employer].     Sleep tests PSG 05/21/13 >> AHI 23 Auto CPAP 05/13/15 to 06/11/15 >> used on 16 of 30 nights with average 8 hrs 40 min.  Average AHI 3.5 with median CPAP 11 and 95 th percentile CPAP 16 cm H2O.  Cardiac tests Echo 03/26/16 >> mild LVH, EF 40%, grade 1 DD,   Past medical history PE, Bipolar, SDH s/p craniotomy 2014, PNA  Past surgical history, Family history, Social history, Allergies reviewed  Vital Signs BP 122/80 (BP Location: Right Arm, Patient Position: Sitting, Cuff Size: Normal)   Pulse 79   Ht 5\' 9"  (1.753 m)   Wt 235 lb 6.4 oz (106.8 kg)   SpO2 96%   BMI 34.76 kg/m   History of Present  Illness Justin [name] is a 51 y.o. male with obstructive sleep apnea.  He is in the process of moving, and had to pack up his CPAP.  His sister has noticed his sleep has been worse w/o CPAP.  He will get settled into his new apartment next week and plans on resuming CPAP.  He was not having any issues with his CPAP set up otherwise.  Physical Exam  General - pleasant Eyes - pupils reactive ENT - no sinus tenderness, no oral exudate, no LAN, MP 4, wears dentures Cardiac - regular, no murmur Chest - no wheeze, rales Abd - soft, non tender Ext - no edema Skin - no rashes Neuro - normal strength Psych - normal mood  Assessment/Plan  Obstructive sleep apnea. - he reports compliance with CPAP and benefit from therapy - continue auto CPAP  Patient Instructions  Follow up in 1 year   Coralyn Helling, MD [facility] Pager:  [phone] 11/05/2016, 4:35 PM

## 2016-11-05 NOTE — Patient Instructions (Signed)
Follow up in 1 year.

## 2016-11-21 ENCOUNTER — Ambulatory Visit (INDEPENDENT_AMBULATORY_CARE_PROVIDER_SITE_OTHER): Payer: Medicaid Other | Admitting: Family

## 2016-11-22 ENCOUNTER — Ambulatory Visit (INDEPENDENT_AMBULATORY_CARE_PROVIDER_SITE_OTHER): Payer: Medicaid Other | Admitting: Orthopedic Surgery

## 2016-11-22 DIAGNOSIS — M7662 Achilles tendinitis, left leg: Secondary | ICD-10-CM | POA: Diagnosis not present

## 2016-11-22 NOTE — Progress Notes (Signed)
Office Visit Note   Patient: Justin [name]           Date of Birth: October 08, 1965           MRN: [id number] Visit Date: 11/22/2016              Requested by: [facility] [address] [place], [place] [address] PCP: [facility], [facility]  Chief Complaint  Patient presents with  . Left Foot - Follow-up, Pain      HPI: Patient is a 51 year old gentleman who is 2 years status post reconstruction left Achilles tendon rupture. Patient is undergone prolonged conservative therapy and still has a nodular Achilles tendinitis is use nitroglycerin patches topical anti-inflammatories he is using lidocaine patches is used to fracture boot with heel with and still is having pain over the nodular changes to the Achilles. Patient complains of tenderness to touch tenderness with trying to wear shoewear.  Assessment & Plan: Visit Diagnoses:  1. Achilles tendinitis, left leg     Plan: Discussed with patient that with failure conservative treatment a surgical option would be to debride the cystic changes of the Achilles reconstruction. Discussed the patient is at increased risk of infection nonhealing of the wound potential for additional surgery potential for persistent pain. Patient states he understands and wishes to proceed at this time.  Follow-Up Instructions: Return if symptoms worsen or fail to improve.   Ortho Exam  Patient is alert, oriented, no adenopathy, well-dressed, normal affect, normal respiratory effort. Examination patient has good range of motion of the Achilles has dorsiflexion to 20 plantarflexion of 40 he has good subtalar motion he has good posterior tibial and dorsalis pedis pulses. There is no redness no cellulitis he does have tenderness to palpation over the nodular changes. There is cystic nodular change and that is about 3 cm in diameter.  Imaging: No results found. No images are attached to the encounter.  Labs: Lab Results  Component Value Date   REPTSTATUS  01/11/2013 FINAL 01/04/2013   CULT  01/04/2013    NO GROWTH 5 DAYS Performed at [facility]    Orders:  No orders of the defined types were placed in this encounter.  No orders of the defined types were placed in this encounter.    Procedures: No procedures performed  Clinical Data: No additional findings.  ROS:  All other systems negative, except as noted in the HPI. Review of Systems  Objective: Vital Signs: There were no vitals taken for this visit.  Specialty Comments:  No specialty comments available.  PMFS History: Patient Active Problem List   Diagnosis Date Noted  . Achilles tendinitis, left leg 05/17/2016  . Obstructive sleep apnea on CPAP 03/07/2015  . Post-operative state 03/07/2015  . Achilles rupture, left 10/21/2014  . OSA (obstructive sleep apnea) 04/23/2013  . Bipolar disorder, unspecified (HCC) 01/07/2013  . Sinus tachycardia 01/07/2013  . Headache(784.0) 01/07/2013  . SDH (subdural hematoma) (HCC) 01/04/2013  . H/O PE 01/04/2013  . Chronic anticoagulation 01/04/2013  . 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  . Acute sinusitis 01/04/2013   Past Medical History:  Diagnosis Date  . Achilles rupture, left   . Bipolar 1 disorder (HCC)   . Dental caries    periodontitis  . Pneumonia   . Pulmonary embolism (HCC)   . Sleep apnea    wears CPAP  . Subdural hematoma (HCC)     Family History  Problem Relation Age of Onset  . Hypertension Mother   .  Stroke Mother   . Multiple sclerosis Sister   . Down syndrome Son     Past Surgical History:  Procedure Laterality Date  . ACHILLES TENDON SURGERY Left 10/21/2014   Procedure: Left Achilles Reconstruction;  Surgeon: Nadara Mustard, MD;  Location: [facility] OR;  Service: Orthopedics;  Laterality: Left;  . APPENDECTOMY    . CRANIOTOMY N/A 01/19/2013   Procedure: CRANIOTOMY HEMATOMA EVACUATION SUBDURAL;  Surgeon: Hewitt Shorts, MD;  Location: [facility];  Service: Neurosurgery;   Laterality: N/A;  . CYSTECTOMY     right head  . ELBOW SURGERY     right  . FRACTURE SURGERY     finger  . MULTIPLE EXTRACTIONS WITH ALVEOLOPLASTY N/A 03/07/2015   Procedure: MULTIPLE EXTRACTION WITH ALVEOLOPLASTY;  Surgeon: [name], DDS;  Location: [facility] OR;  Service: Oral Surgery;  Laterality: N/A;   Social History   Occupational History  . disability    Social History Main Topics  . Smoking status: Never Smoker  . Smokeless tobacco: Never Used  . Alcohol use No  . Drug use: No  . Sexual activity: Not on file

## 2016-12-04 ENCOUNTER — Other Ambulatory Visit (INDEPENDENT_AMBULATORY_CARE_PROVIDER_SITE_OTHER): Payer: Self-pay | Admitting: Family

## 2016-12-05 ENCOUNTER — Encounter (HOSPITAL_COMMUNITY): Payer: Self-pay | Admitting: *Deleted

## 2016-12-05 NOTE — Progress Notes (Signed)
Spoke with pt for pre-op call. Pt denies cardiac history, chest pain or sob. Pt states he is not a diabetic. Pt states he has no one that can pick him up after surgery on Friday, he states he will need to stay overnight.

## 2016-12-06 NOTE — Progress Notes (Signed)
Anesthesia Chart Review:  Pt is a same day work up.   Pt is a 51 year old male scheduled for L achilles debridement on 12/07/2016 with Aldean Baker, MD  PMH includes:  SDH (2014), PE, OSA, bipolar disorder. Never smoker. S/p L achilles reconstruction 10/21/14. S/p craniotomy, SDH evacuation 01/19/13.   - ED visit 05/28/16 for CAP.  Medications include: lithium, remeron, seroquel  Labs will be obtained DOS  CXR 05/28/16:  - Left upper lobe and possible right lower lobe pneumonia. Followup PA and lateral chest X-ray is recommended in 3-4 weeks following trial of antibiotic therapy to ensure resolution and exclude underlying malignancy. - Thoracic aortic atherosclerosis. - Will repeat CXR DOS.   EKG 01/13/16: NSR. Nonspecific T wave abnormality  If labs and CXR acceptable DOS, I anticipate pt can proceed as scheduled.   Rica Mast, FNP-BC [facility]/Anesthesiology Phone: [phone] 12/06/2016 10:38 AM

## 2016-12-07 ENCOUNTER — Ambulatory Visit (HOSPITAL_COMMUNITY): Payer: Medicaid Other | Admitting: Emergency Medicine

## 2016-12-07 ENCOUNTER — Encounter (HOSPITAL_COMMUNITY)
Admission: RE | Disposition: A | Discharge status: Home / Self Care | Payer: Self-pay | Source: Ambulatory Visit | Attending: Orthopedic Surgery

## 2016-12-07 ENCOUNTER — Encounter (HOSPITAL_COMMUNITY): Payer: Self-pay | Admitting: *Deleted

## 2016-12-07 ENCOUNTER — Ambulatory Visit (HOSPITAL_COMMUNITY): Payer: Medicaid Other

## 2016-12-07 ENCOUNTER — Ambulatory Visit (HOSPITAL_COMMUNITY)
Admission: RE | Admit: 2016-12-07 | Discharge: 2016-12-08 | Disposition: A | Discharge status: Home / Self Care | Payer: Medicaid Other | Source: Ambulatory Visit | Attending: Orthopedic Surgery | Admitting: Orthopedic Surgery

## 2016-12-07 DIAGNOSIS — M7662 Achilles tendinitis, left leg: Secondary | ICD-10-CM | POA: Diagnosis not present

## 2016-12-07 DIAGNOSIS — G4730 Sleep apnea, unspecified: Secondary | ICD-10-CM | POA: Insufficient documentation

## 2016-12-07 DIAGNOSIS — Z9889 Other specified postprocedural states: Secondary | ICD-10-CM | POA: Diagnosis not present

## 2016-12-07 DIAGNOSIS — Z86711 Personal history of pulmonary embolism: Secondary | ICD-10-CM | POA: Diagnosis not present

## 2016-12-07 DIAGNOSIS — F319 Bipolar disorder, unspecified: Secondary | ICD-10-CM | POA: Insufficient documentation

## 2016-12-07 DIAGNOSIS — Z79899 Other long term (current) drug therapy: Secondary | ICD-10-CM | POA: Insufficient documentation

## 2016-12-07 DIAGNOSIS — Z09 Encounter for follow-up examination after completed treatment for conditions other than malignant neoplasm: Secondary | ICD-10-CM

## 2016-12-07 HISTORY — PX: I & D EXTREMITY: SHX5045

## 2016-12-07 LAB — BASIC METABOLIC PANEL
Anion gap: 6 (ref 5–15)
BUN: 10 mg/dL (ref 6–20)
CHLORIDE: 110 mmol/L (ref 101–111)
CO2: 25 mmol/L (ref 22–32)
CREATININE: 1.35 mg/dL — AB (ref 0.61–1.24)
Calcium: 8.9 mg/dL (ref 8.9–10.3)
GFR calc Af Amer: >60 mL/min (ref >60)
GFR calc non Af Amer: 58 mL/min — ABNORMAL LOW (ref >60)
Glucose, Bld: 106 mg/dL — ABNORMAL HIGH (ref 65–99)
Potassium: 3.9 mmol/L (ref 3.5–5.1)
Sodium: 141 mmol/L (ref 135–145)

## 2016-12-07 LAB — CBC
HEMATOCRIT: 38 % — AB (ref 39.0–52.0)
HEMOGLOBIN: 12.7 g/dL — AB (ref 13.0–17.0)
MCH: 28.8 pg (ref 26.0–34.0)
MCHC: 33.4 g/dL (ref 30.0–36.0)
MCV: 86.2 fL (ref 78.0–100.0)
Platelets: 239 10*3/uL (ref 150–400)
RBC: 4.41 MIL/uL (ref 4.22–5.81)
RDW: 13.8 % (ref 11.5–15.5)
WBC: 8.7 10*3/uL (ref 4.0–10.5)

## 2016-12-07 SURGERY — IRRIGATION AND DEBRIDEMENT EXTREMITY
Anesthesia: General | Laterality: Left

## 2016-12-07 MED ORDER — POLYETHYLENE GLYCOL 3350 17 G PO PACK: 17.0000 g | PACK | Freq: Every day | ORAL | Status: DC | PRN

## 2016-12-07 MED ORDER — HYDROMORPHONE HCL 1 MG/ML IJ SOLN
INTRAMUSCULAR | Status: AC
  Administered 2016-12-07: 0.5 mg via INTRAVENOUS
  Filled 2016-12-07: qty 1

## 2016-12-07 MED ORDER — PHENYLEPHRINE 40 MCG/ML (10ML) SYRINGE FOR IV PUSH (FOR BLOOD PRESSURE SUPPORT)
PREFILLED_SYRINGE | INTRAVENOUS | Status: AC
  Filled 2016-12-07: qty 10

## 2016-12-07 MED ORDER — ACETAMINOPHEN 325 MG PO TABS
650.0000 mg | ORAL_TABLET | Freq: Four times a day (QID) | ORAL | Status: DC | PRN
  Administered 2016-12-07: 650 mg via ORAL
  Filled 2016-12-07: qty 2

## 2016-12-07 MED ORDER — PROPOFOL 10 MG/ML IV BOLUS
INTRAVENOUS | Status: AC
  Filled 2016-12-07: qty 20

## 2016-12-07 MED ORDER — METHOCARBAMOL 500 MG PO TABS
ORAL_TABLET | ORAL | Status: AC
  Administered 2016-12-07: 500 mg via ORAL
  Filled 2016-12-07: qty 1

## 2016-12-07 MED ORDER — OXYCODONE HCL 5 MG/5ML PO SOLN: 5.0000 mg | Freq: Once | ORAL | Status: DC | PRN

## 2016-12-07 MED ORDER — METOCLOPRAMIDE HCL 5 MG PO TABS: 5.0000 mg | ORAL_TABLET | Freq: Three times a day (TID) | ORAL | Status: DC | PRN

## 2016-12-07 MED ORDER — METOCLOPRAMIDE HCL 5 MG/ML IJ SOLN: 5.0000 mg | Freq: Three times a day (TID) | INTRAMUSCULAR | Status: DC | PRN

## 2016-12-07 MED ORDER — MIDAZOLAM HCL 2 MG/2ML IJ SOLN
INTRAMUSCULAR | Status: AC
  Filled 2016-12-07: qty 2

## 2016-12-07 MED ORDER — METHOCARBAMOL 1000 MG/10ML IJ SOLN
500.0000 mg | Freq: Four times a day (QID) | INTRAVENOUS | Status: DC | PRN
  Filled 2016-12-07: qty 5

## 2016-12-07 MED ORDER — SODIUM CHLORIDE 0.9 % IV SOLN: INTRAVENOUS | Status: DC

## 2016-12-07 MED ORDER — FENTANYL CITRATE (PF) 250 MCG/5ML IJ SOLN
INTRAMUSCULAR | Status: AC
  Filled 2016-12-07: qty 5

## 2016-12-07 MED ORDER — ONDANSETRON HCL 4 MG/2ML IJ SOLN
4.0000 mg | Freq: Four times a day (QID) | INTRAMUSCULAR | Status: DC | PRN
Start: 2016-12-07 — End: 2016-12-08

## 2016-12-07 MED ORDER — LITHIUM CARBONATE 300 MG PO CAPS
600.0000 mg | ORAL_CAPSULE | Freq: Every day | ORAL | Status: DC
  Administered 2016-12-07: 600 mg via ORAL
  Filled 2016-12-07: qty 2

## 2016-12-07 MED ORDER — OXYCODONE HCL 5 MG PO TABS: 5.0000 mg | ORAL_TABLET | Freq: Once | ORAL | Status: DC | PRN

## 2016-12-07 MED ORDER — MIDAZOLAM HCL 5 MG/5ML IJ SOLN
INTRAMUSCULAR | Status: DC | PRN
  Administered 2016-12-07: 2 mg via INTRAVENOUS

## 2016-12-07 MED ORDER — HYDROMORPHONE HCL 1 MG/ML IJ SOLN
INTRAMUSCULAR | Status: AC
  Filled 2016-12-07: qty 1

## 2016-12-07 MED ORDER — CEFAZOLIN SODIUM-DEXTROSE 2-4 GM/100ML-% IV SOLN
2.0000 g | INTRAVENOUS | Status: AC
  Administered 2016-12-07: 2 g via INTRAVENOUS

## 2016-12-07 MED ORDER — HYDROMORPHONE HCL 1 MG/ML IJ SOLN
0.2500 mg | INTRAMUSCULAR | Status: DC | PRN
  Administered 2016-12-07 (×4): 0.5 mg via INTRAVENOUS

## 2016-12-07 MED ORDER — OXYCODONE HCL 5 MG PO TABS
ORAL_TABLET | ORAL | Status: AC
  Administered 2016-12-07: 10 mg via ORAL
  Filled 2016-12-07: qty 2

## 2016-12-07 MED ORDER — DOCUSATE SODIUM 100 MG PO CAPS
100.0000 mg | ORAL_CAPSULE | Freq: Two times a day (BID) | ORAL | Status: DC
  Administered 2016-12-07 – 2016-12-08 (×2): 100 mg via ORAL
  Filled 2016-12-07 (×2): qty 1

## 2016-12-07 MED ORDER — ONDANSETRON HCL 4 MG/2ML IJ SOLN
INTRAMUSCULAR | Status: DC | PRN
  Administered 2016-12-07: 4 mg via INTRAVENOUS

## 2016-12-07 MED ORDER — FENTANYL CITRATE (PF) 100 MCG/2ML IJ SOLN
INTRAMUSCULAR | Status: DC | PRN
  Administered 2016-12-07: 50 ug via INTRAVENOUS

## 2016-12-07 MED ORDER — LACTATED RINGERS IV SOLN
INTRAVENOUS | Status: DC
  Administered 2016-12-07 (×2): via INTRAVENOUS

## 2016-12-07 MED ORDER — 0.9 % SODIUM CHLORIDE (POUR BTL) OPTIME
TOPICAL | Status: DC | PRN
  Administered 2016-12-07: 1000 mL

## 2016-12-07 MED ORDER — CEFAZOLIN SODIUM-DEXTROSE 2-4 GM/100ML-% IV SOLN
INTRAVENOUS | Status: AC
  Filled 2016-12-07: qty 100

## 2016-12-07 MED ORDER — BISACODYL 10 MG RE SUPP: 10.0000 mg | Freq: Every day | RECTAL | Status: DC | PRN

## 2016-12-07 MED ORDER — METHOCARBAMOL 500 MG PO TABS
500.0000 mg | ORAL_TABLET | Freq: Four times a day (QID) | ORAL | Status: DC | PRN
  Administered 2016-12-07 – 2016-12-08 (×4): 500 mg via ORAL
  Filled 2016-12-07 (×4): qty 1

## 2016-12-07 MED ORDER — CEFAZOLIN SODIUM-DEXTROSE 2-4 GM/100ML-% IV SOLN
2.0000 g | Freq: Four times a day (QID) | INTRAVENOUS | Status: AC
  Administered 2016-12-07 – 2016-12-08 (×3): 2 g via INTRAVENOUS
  Filled 2016-12-07 (×3): qty 100

## 2016-12-07 MED ORDER — ACETAMINOPHEN 650 MG RE SUPP: 650.0000 mg | Freq: Four times a day (QID) | RECTAL | Status: DC | PRN

## 2016-12-07 MED ORDER — OXYCODONE HCL 5 MG PO TABS
5.0000 mg | ORAL_TABLET | ORAL | Status: DC | PRN
  Administered 2016-12-07 – 2016-12-08 (×7): 10 mg via ORAL
  Filled 2016-12-07 (×6): qty 2

## 2016-12-07 MED ORDER — LIDOCAINE HCL (CARDIAC) 20 MG/ML IV SOLN
INTRAVENOUS | Status: DC | PRN
  Administered 2016-12-07: 50 mg via INTRAVENOUS

## 2016-12-07 MED ORDER — CHLORHEXIDINE GLUCONATE 4 % EX LIQD: 60.0000 mL | Freq: Once | CUTANEOUS | Status: DC

## 2016-12-07 MED ORDER — MEPERIDINE HCL 25 MG/ML IJ SOLN: 6.2500 mg | INTRAMUSCULAR | Status: DC | PRN

## 2016-12-07 MED ORDER — ONDANSETRON HCL 4 MG PO TABS
4.0000 mg | ORAL_TABLET | Freq: Four times a day (QID) | ORAL | Status: DC | PRN
Start: 2016-12-07 — End: 2016-12-08

## 2016-12-07 MED ORDER — MIRTAZAPINE 45 MG PO TABS
45.0000 mg | ORAL_TABLET | Freq: Every day | ORAL | Status: DC
  Administered 2016-12-07: 45 mg via ORAL
  Filled 2016-12-07: qty 1

## 2016-12-07 MED ORDER — MAGNESIUM CITRATE PO SOLN: 1.0000 | Freq: Once | ORAL | Status: DC | PRN

## 2016-12-07 MED ORDER — QUETIAPINE FUMARATE ER 400 MG PO TB24
800.0000 mg | ORAL_TABLET | Freq: Every day | ORAL | Status: DC
  Administered 2016-12-07: 800 mg via ORAL
  Filled 2016-12-07: qty 2

## 2016-12-07 MED ORDER — PROPOFOL 10 MG/ML IV BOLUS
INTRAVENOUS | Status: DC | PRN
  Administered 2016-12-07: 200 mg via INTRAVENOUS

## 2016-12-07 MED ORDER — ONDANSETRON HCL 4 MG/2ML IJ SOLN
INTRAMUSCULAR | Status: AC
  Filled 2016-12-07: qty 2

## 2016-12-07 MED ORDER — PROMETHAZINE HCL 25 MG/ML IJ SOLN: 6.2500 mg | INTRAMUSCULAR | Status: DC | PRN

## 2016-12-07 MED ORDER — LITHIUM CARBONATE ER 450 MG PO TBCR
450.0000 mg | EXTENDED_RELEASE_TABLET | Freq: Every morning | ORAL | Status: DC
Start: 2016-12-08 — End: 2016-12-08
  Administered 2016-12-08: 450 mg via ORAL
  Filled 2016-12-07: qty 1

## 2016-12-07 MED ORDER — ACETAMINOPHEN 325 MG PO TABS
ORAL_TABLET | ORAL | Status: AC
  Administered 2016-12-07: 650 mg via ORAL
  Filled 2016-12-07: qty 2

## 2016-12-07 MED ORDER — HYDROMORPHONE HCL 1 MG/ML IJ SOLN
1.0000 mg | INTRAMUSCULAR | Status: DC | PRN
  Administered 2016-12-07: 1 mg via INTRAVENOUS

## 2016-12-07 MED ORDER — OXYCODONE-ACETAMINOPHEN 5-325 MG PO TABS: 1.0000 | ORAL_TABLET | ORAL | 0 refills | Status: DC | PRN

## 2016-12-07 SURGICAL SUPPLY — 34 items
BLADE SURG 21 STRL SS (BLADE) ×4 IMPLANT
BNDG COHESIVE 6X5 TAN STRL LF (GAUZE/BANDAGES/DRESSINGS) IMPLANT
BNDG GAUZE ELAST 4 BULKY (GAUZE/BANDAGES/DRESSINGS) IMPLANT
COVER SURGICAL LIGHT HANDLE (MISCELLANEOUS) ×2 IMPLANT
DRAPE U-SHAPE 47X51 STRL (DRAPES) ×2 IMPLANT
DRESSING PREVENA PLUS CUSTOM (GAUZE/BANDAGES/DRESSINGS) ×1 IMPLANT
DRSG ADAPTIC 3X8 NADH LF (GAUZE/BANDAGES/DRESSINGS) IMPLANT
DRSG PREVENA PLUS CUSTOM (GAUZE/BANDAGES/DRESSINGS) ×2
DURAPREP 26ML APPLICATOR (WOUND CARE) ×2 IMPLANT
ELECT REM PT RETURN 9FT ADLT (ELECTROSURGICAL) ×2
ELECTRODE REM PT RTRN 9FT ADLT (ELECTROSURGICAL) ×1 IMPLANT
GAUZE SPONGE 4X4 12PLY STRL (GAUZE/BANDAGES/DRESSINGS) IMPLANT
GLOVE BIOGEL PI IND STRL 9 (GLOVE) ×2 IMPLANT
GLOVE BIOGEL PI INDICATOR 9 (GLOVE) ×2
GLOVE SURG ORTHO 9.0 STRL STRW (GLOVE) ×4 IMPLANT
GOWN STRL REUS W/ TWL XL LVL3 (GOWN DISPOSABLE) ×3 IMPLANT
GOWN STRL REUS W/TWL XL LVL3 (GOWN DISPOSABLE) ×3
HANDPIECE INTERPULSE COAX TIP (DISPOSABLE)
KIT BASIN OR (CUSTOM PROCEDURE TRAY) ×2 IMPLANT
KIT DRSG PREVENA PLUS 7DAY 125 (MISCELLANEOUS) ×2 IMPLANT
KIT PREVENA INCISION MGT 13 (CANNISTER) ×2 IMPLANT
KIT ROOM TURNOVER OR (KITS) ×2 IMPLANT
MANIFOLD NEPTUNE II (INSTRUMENTS) IMPLANT
NS IRRIG 1000ML POUR BTL (IV SOLUTION) ×2 IMPLANT
PACK ORTHO EXTREMITY (CUSTOM PROCEDURE TRAY) ×2 IMPLANT
PAD ARMBOARD 7.5X6 YLW CONV (MISCELLANEOUS) ×2 IMPLANT
SET HNDPC FAN SPRY TIP SCT (DISPOSABLE) IMPLANT
STOCKINETTE IMPERVIOUS 9X36 MD (GAUZE/BANDAGES/DRESSINGS) IMPLANT
SUT ETHILON 2 0 PSLX (SUTURE) ×2 IMPLANT
SWAB COLLECTION DEVICE MRSA (MISCELLANEOUS) IMPLANT
SWAB CULTURE ESWAB REG 1ML (MISCELLANEOUS) IMPLANT
TOWEL OR 17X26 10 PK STRL BLUE (TOWEL DISPOSABLE) ×2 IMPLANT
TUBE CONNECTING 12X1/4 (SUCTIONS) IMPLANT
YANKAUER SUCT BULB TIP NO VENT (SUCTIONS) IMPLANT

## 2016-12-07 NOTE — Anesthesia Postprocedure Evaluation (Signed)
Anesthesia Post Note  Patient: Justin [name]  Procedure(s) Performed: Procedure(s) (LRB): LEFT ACHILLES DEBRIDEMENT (Left)     Patient location during evaluation: PACU Anesthesia Type: General Level of consciousness: awake and alert Pain management: pain level controlled Vital Signs Assessment: post-procedure vital signs reviewed and stable Respiratory status: spontaneous breathing, nonlabored ventilation and respiratory function stable Cardiovascular status: blood pressure returned to baseline and stable Postop Assessment: no apparent nausea or vomiting Anesthetic complications: no    Last Vitals:  Vitals:   12/07/16 0945 12/07/16 1000  BP: (!) 130/96 (!) 129/98  Pulse: 88 77  Resp: 12 17  Temp:    SpO2: 96% 96%    Last Pain:  Vitals:   12/07/16 1000  TempSrc:   PainSc: 7                  Lowella Curb

## 2016-12-07 NOTE — H&P (Signed)
Justin [name] is an 51 y.o. male.   Chief Complaint: Achilles tendinopathy HPI: Patient is a 51 year old gentleman who is 2 years status post reconstruction left Achilles tendon rupture. Patient is undergone prolonged conservative therapy and still has a nodular Achilles tendinitis is use nitroglycerin patches topical anti-inflammatories he is using lidocaine patches is used to fracture boot with heel with and still is having pain over the nodular changes to the Achilles. Patient complains of tenderness to touch tenderness with trying to wear shoewear.  Past Medical History:  Diagnosis Date  . Achilles rupture, left   . Bipolar 1 disorder (HCC)   . Dental caries    periodontitis  . Pneumonia   . Pulmonary embolism (HCC)   . Sleep apnea    wears CPAP  . Subdural hematoma (HCC) 2015    Past Surgical History:  Procedure Laterality Date  . ACHILLES TENDON SURGERY Left 10/21/2014   Procedure: Left Achilles Reconstruction;  Surgeon: Nadara Mustard, MD;  Location: [facility] OR;  Service: Orthopedics;  Laterality: Left;  . APPENDECTOMY    . CRANIOTOMY N/A 01/19/2013   Procedure: CRANIOTOMY HEMATOMA EVACUATION SUBDURAL;  Surgeon: Hewitt Shorts, MD;  Location: [facility] OR[facility];  Service: Neurosurgery;  Laterality: N/A;  . CYSTECTOMY     right head  . ELBOW SURGERY     right  . FRACTURE SURGERY     finger  . MULTIPLE EXTRACTIONS WITH ALVEOLOPLASTY N/A 03/07/2015   Procedure: MULTIPLE EXTRACTION WITH ALVEOLOPLASTY;  Surgeon: [name], DDS;  Location: [facility] OR;  Service: Oral Surgery;  Laterality: N/A;    Family History  Problem Relation Age of Onset  . Hypertension Mother   . Stroke Mother   . Multiple sclerosis Sister   . Down syndrome Son    Social History:  reports that he has never smoked. He has never used smokeless tobacco. He reports that he does not drink alcohol or use drugs.  Allergies: No Known Allergies  Medications Prior to Admission  Medication Sig Dispense Refill  . lithium  600 MG capsule Take 600 mg by mouth at bedtime.    Marland Kitchen lithium carbonate (ESKALITH) 450 MG CR tablet Take 450 mg by mouth every morning.  0  . mirtazapine (REMERON) 45 MG tablet Take 45 mg by mouth at bedtime.    Marland Kitchen QUEtiapine (SEROQUEL XR) 400 MG 24 hr tablet Take 800 mg by mouth at bedtime.    . cyclobenzaprine (FLEXERIL) 10 MG tablet Take 1 tablet (10 mg total) by mouth 2 (two) times daily as needed for muscle spasms. (Patient not taking: Reported on 12/05/2016) 20 tablet 0  . nitroGLYCERIN (NITRODUR - DOSED IN MG/24 HR) 0.2 mg/hr patch Place 1 patch (0.2 mg total) onto the skin daily. (Patient not taking: Reported on 12/05/2016) 30 patch 2    No results found for this or any previous visit (from the past 48 hour(s)). No results found.  Review of Systems  All other systems reviewed and are negative.   Blood pressure (!) 141/91, pulse 82, temperature 98.4 F (36.9 C), temperature source Oral, resp. rate 20, height 5\' 9"  (1.753 m), weight 234 lb (106.1 kg), SpO2 100 %. Physical Exam  Patient is alert oriented no adenopathy well-dressed normal affect [name] patient has nodular changes to the Achilles there is no redness no cellulitis there is tenderness to palpation. Assessment/Plan Assessment: Nodular Achilles tendinitis status post reconstruction.  Plan: We'll plan for debridement of the cystic changes of the Achilles. Risk and benefits  were discussed including infection nonhealing the wound need for additional surgery. Patient states he understands wish to proceed at this time.  Nadara Mustard, MD 12/07/2016, 6:37 AM

## 2016-12-07 NOTE — Evaluation (Signed)
Physical Therapy Evaluation Patient Details Name: Justin [name] MRN: [id number] DOB: [birth year] Today's Date: 12/07/2016   History of Present Illness  Pt is a 51 y/o male s/p debridement of L achilles with removal of necrotic tendon, secondary reconstruction and wound VAC application. PMH including but not limited to Bipolar disorder, L achilles tendon sx in 2016, hx of craniotomy in 2014.  Clinical Impression  Pt presented supine in bed with HOB elevated, awake and willing to participate in therapy session. Prior to admission, pt reported that he was independent with all functional mobility and ADLs. Pt currently requires supervision for transfers with RW and min guard for safety with ambulation with use of RW. Pt limited this session secondary to reports of 8/10 pain and feeling "light-headed". Plan for stair training next session. PT will continue to follow acutely for mobility progression and to ensure a safe d/c home.    Follow Up Recommendations No PT follow up;Supervision for mobility/OOB    Equipment Recommendations  Rolling walker with 5" wheels    Recommendations for Other Services       Precautions / Restrictions Precautions Precautions: Fall Required Braces or Orthoses: Other Brace/Splint Other Brace/Splint: CAM boot Restrictions Weight Bearing Restrictions: Yes LLE Weight Bearing: Weight bearing as tolerated      Mobility  Bed Mobility Overal bed mobility: Modified Independent                Transfers Overall transfer level: Needs assistance Equipment used: Rolling walker (2 wheeled) Transfers: Sit to/from Stand Sit to Stand: Supervision         General transfer comment: increased time and effort, supervision for safety  Ambulation/Gait Ambulation/Gait assistance: Min guard Ambulation Distance (Feet): 100 Feet Assistive device: Rolling walker (2 wheeled) Gait Pattern/deviations: Step-to pattern;Step-through pattern;Decreased step length -  right;Decreased stance time - left;Decreased stride length;Decreased weight shift to left Gait velocity: decreased Gait velocity interpretation: Below normal speed for age/gender General Gait Details: modest instability and moderately antalgic gait pattern, but no overt LOB or need for physical assistance, min guard for safety  Stairs            Wheelchair Mobility    Modified Rankin (Stroke Patients Only)       Balance Overall balance assessment: Needs assistance Sitting-balance support: Feet supported Sitting balance-Leahy Scale: Good     Standing balance support: During functional activity;Bilateral upper extremity supported Standing balance-Leahy Scale: Poor Standing balance comment: reliant on external support                             Pertinent Vitals/Pain Pain Assessment: 0-10 Pain Score: 8  Pain Location: L ankle Pain Descriptors / Indicators: Sore (pulling) Pain Intervention(s): Monitored during session;Repositioned    Home Living Family/patient expects to be discharged to:: Private residence Living Arrangements: Alone Available Help at Discharge: Family;Available PRN/intermittently Type of Home: Apartment Home Access: Stairs to enter Entrance Stairs-Rails: Doctor, general practice of Steps: 3 Home Layout: One level Home Equipment: None      Prior Function Level of Independence: Independent               Hand Dominance        Extremity/Trunk Assessment   Upper Extremity Assessment Upper Extremity Assessment: Overall WFL for tasks assessed    Lower Extremity Assessment Lower Extremity Assessment: LLE deficits/detail LLE Deficits / Details: painful with weight bearing; tolerating partial weight bearing in standing and with walking LLE: Unable to  fully assess due to pain;Unable to fully assess due to immobilization LLE Sensation: decreased light touch    Cervical / Trunk Assessment Cervical / Trunk Assessment:  Normal  Communication   Communication: No difficulties  Cognition Arousal/Alertness: Awake/alert Behavior During Therapy: WFL for tasks assessed/performed Overall Cognitive Status: Within Functional Limits for tasks assessed                                        General Comments      Exercises     Assessment/Plan    PT Assessment Patient needs continued PT services  PT Problem List Decreased strength;Decreased range of motion;Decreased activity tolerance;Decreased balance;Decreased mobility;Decreased coordination;Decreased knowledge of use of DME;Decreased safety awareness;Decreased knowledge of precautions;Pain       PT Treatment Interventions DME instruction;Gait training;Stair training;Functional mobility training;Therapeutic activities;Therapeutic exercise;Balance training;Neuromuscular re-education;Patient/family education    PT Goals (Current goals can be found in the Care Plan section)  Acute Rehab PT Goals Patient Stated Goal: return to independence PT Goal Formulation: With patient Time For Goal Achievement: 12/14/16 Potential to Achieve Goals: Good    Frequency Min 5X/week   Barriers to discharge        Co-evaluation               AM-PAC PT "6 Clicks" Daily Activity  Outcome Measure Difficulty turning over in bed (including adjusting bedclothes, sheets and blankets)?: A Little Difficulty moving from lying on back to sitting on the side of the bed? : A Little Difficulty sitting down on and standing up from a chair with arms (e.g., wheelchair, bedside commode, etc,.)?: A Little Help needed moving to and from a bed to chair (including a wheelchair)?: A Little Help needed walking in hospital room?: A Little Help needed climbing 3-5 steps with a railing? : A Little 6 Click Score: 18    End of Session Equipment Utilized During Treatment: Gait belt Activity Tolerance: Patient tolerated treatment well Patient left: in bed;with call  bell/phone within reach Nurse Communication: Mobility status PT Visit Diagnosis: Other abnormalities of gait and mobility (R26.89);Pain Pain - Right/Left: Left Pain - part of body: Ankle and joints of foot    Time: 1191-4782 PT Time Calculation (min) (ACUTE ONLY): 19 min   Charges:   PT Evaluation $PT Eval Moderate Complexity: 1 Mod     PT G Codes:   PT G-Codes **NOT FOR INPATIENT CLASS** Functional Assessment Tool Used: AM-PAC 6 Clicks Basic Mobility;Clinical judgement Functional Limitation: Mobility: Walking and moving around Mobility: Walking and Moving Around Current Status (N5621): At least 40 percent but less than 60 percent impaired, limited or restricted Mobility: Walking and Moving Around Goal Status [phone]): At least 1 percent but less than 20 percent impaired, limited or restricted    [facility], [place], DPT [phone]   Alessandra Bevels Negin Hegg 12/07/2016, 2:43 PM

## 2016-12-07 NOTE — Op Note (Signed)
12/07/2016  9:16 AM  PATIENT:  Justin [name]    PRE-OPERATIVE DIAGNOSIS:  Left Achilles Tendonitis   POST-OPERATIVE DIAGNOSIS:  Same  PROCEDURE:  LEFT ACHILLES DEBRIDEMENT, removal of necrotic tendon and retained suture. Secondary reconstruction. Application wound VAC.  SURGEON:  Nadara Mustard, MD  PHYSICIAN ASSISTANT:None ANESTHESIA:   General  PREOPERATIVE INDICATIONS:  [name] is a  51 y.o. male with a diagnosis of Left Achilles Tendonitis  who failed conservative measures and elected for surgical management.    The risks benefits and alternatives were discussed with the patient preoperatively including but not limited to the risks of infection, bleeding, nerve injury, cardiopulmonary complications, the need for revision surgery, among others, and the patient was willing to proceed.  OPERATIVE IMPLANTS: Prevena wound VAC  OPERATIVE FINDINGS: Sterile cystic changes to the Achilles with retained nonabsorbable suture  OPERATIVE PROCEDURE: Patient was brought the operating room and underwent a general anesthetic. After adequate levels anesthesia obtained patient's left lower extremity was prepped using DuraPrep draped in the sterile field a timeout was called. A posterior medial incision was made through the patient's previous incision. This was carried down to the Achilles tendon. There was large cystic changes within the Achilles tendon. Using a 21 blade knife the cystic changes were excised the retained nonabsorbable suture was excised. The Achilles tendon was freed of the adhesions. Patient underwent secondary reconstruction. The incision was closed using 2-0 nylon. A Prevena wound VAC was applied. Patient was taken the PACU in stable condition.

## 2016-12-07 NOTE — Anesthesia Procedure Notes (Signed)
Procedure Name: LMA Insertion Date/Time: 12/07/2016 8:51 AM Performed by: Little Ishikawa L Pre-anesthesia Checklist: Patient identified, Emergency Drugs available, Suction available and Patient being monitored Patient Re-evaluated:Patient Re-evaluated prior to induction Oxygen Delivery Method: Circle System Utilized Preoxygenation: Pre-oxygenation with 100% oxygen Induction Type: IV induction Ventilation: Mask ventilation without difficulty LMA: LMA inserted LMA Size: 4.0 Number of attempts: 1 Airway Equipment and Method: Bite block Placement Confirmation: positive ETCO2 Tube secured with: Tape Dental Injury: Teeth and Oropharynx as per pre-operative assessment

## 2016-12-07 NOTE — Progress Notes (Signed)
Orthopedic Tech Progress Note Patient Details:  Justin [name] 1965/09/21 [id number]  Ortho Devices Type of Ortho Device: CAM walker Ortho Device/Splint Location: lle Ortho Device/Splint Interventions: Application   Joelys Staubs 12/07/2016, 10:44 AM

## 2016-12-07 NOTE — Anesthesia Preprocedure Evaluation (Signed)
Anesthesia Evaluation  Patient identified by MRN, date of birth, ID band Patient awake    Reviewed: Allergy & Precautions, NPO status , Patient's Chart, lab work & pertinent test results  Airway Mallampati: II  TM Distance: >3 FB Neck ROM: Full    Dental  (+) Dental Advisory Given   Pulmonary neg pulmonary ROS, sleep apnea and Continuous Positive Airway Pressure Ventilation , pneumonia, PE   breath sounds clear to auscultation       Cardiovascular negative cardio ROS   Rhythm:Regular Rate:Normal     Neuro/Psych  Headaches, PSYCHIATRIC DISORDERS Bipolar Disorder negative neurological ROS  negative psych ROS   GI/Hepatic negative GI ROS, Neg liver ROS,   Endo/Other  negative endocrine ROS  Renal/GU negative Renal ROS  negative genitourinary   Musculoskeletal negative musculoskeletal ROS (+)   Abdominal   Peds negative pediatric ROS (+)  Hematology negative hematology ROS (+)   Anesthesia Other Findings   Reproductive/Obstetrics negative OB ROS                             Anesthesia Physical  Anesthesia Plan  ASA: III  Anesthesia Plan: General   Post-op Pain Management:    Induction: Intravenous  PONV Risk Score and Plan: 2 and Ondansetron and Midazolam  Airway Management Planned: Oral ETT  Additional Equipment:   Intra-op Plan:   Post-operative Plan: Extubation in OR  Informed Consent: I have reviewed the patients History and Physical, chart, labs and discussed the procedure including the risks, benefits and alternatives for the proposed anesthesia with the patient or authorized representative who has indicated his/her understanding and acceptance.   Dental advisory given  Plan Discussed with: Anesthesiologist, Surgeon and CRNA  Anesthesia Plan Comments:         Anesthesia Quick Evaluation

## 2016-12-07 NOTE — Transfer of Care (Signed)
Immediate Anesthesia Transfer of Care Note  Patient: Justin [name]  Procedure(s) Performed: Procedure(s): LEFT ACHILLES DEBRIDEMENT (Left)  Patient Location: PACU  Anesthesia Type:General  Level of Consciousness: awake and alert   Airway & Oxygen Therapy: Patient Spontanous Breathing and Patient connected to nasal cannula oxygen  Post-op Assessment: Report given to RN and Post -op Vital signs reviewed and stable  Post vital signs: Reviewed and stable  Last Vitals:  Vitals:   12/07/16 0623 12/07/16 0921  BP: (!) 141/91 124/89  Pulse: 82   Resp: 20 18  Temp: 36.9 C 36.6 C  SpO2: 100%     Last Pain:  Vitals:   12/07/16 0721  TempSrc:   PainSc: 8       Patients Stated Pain Goal: 4 (12/07/16 0721)  Complications: No apparent anesthesia complications

## 2016-12-08 ENCOUNTER — Encounter (HOSPITAL_COMMUNITY): Payer: Self-pay | Admitting: Orthopedic Surgery

## 2016-12-08 DIAGNOSIS — M7662 Achilles tendinitis, left leg: Secondary | ICD-10-CM | POA: Diagnosis not present

## 2016-12-08 NOTE — Discharge Summary (Signed)
Patient ID: Justin [name] MRN: [id number] DOB/AGE: May 16, 1965 51 y.o.  Admit date: 12/07/2016 Discharge date: 12/08/2016  Admission Diagnoses:  Active Problems:   Achilles tendinitis of left lower extremity   Discharge Diagnoses:  Same  Past Medical History:  Diagnosis Date  . Achilles rupture, left   . Bipolar 1 disorder (HCC)   . Dental caries    periodontitis  . Pneumonia   . Pulmonary embolism (HCC)   . Sleep apnea    wears CPAP  . Subdural hematoma (HCC) 2015    Surgeries: Procedure(s): LEFT ACHILLES DEBRIDEMENT on 12/07/2016   Consultants:   Discharged Condition: Improved  Hospital Course: ZAKKARY THIBAULT is an 51 y.o. male who was admitted 12/07/2016 for operative treatment of<principal problem not specified>. Patient has severe unremitting pain that affects sleep, daily activities, and work/hobbies. After pre-op clearance the patient was taken to the operating room on 12/07/2016 and underwent  Procedure(s): LEFT ACHILLES DEBRIDEMENT.    Patient was given perioperative antibiotics: Anti-infectives    Start     Dose/Rate Route Frequency Ordered Stop   12/07/16 1500  ceFAZolin (ANCEF) IVPB 2g/100 mL premix     2 g 200 mL/hr over 30 Minutes Intravenous Every 6 hours 12/07/16 0939 12/08/16 0359   12/07/16 0644  ceFAZolin (ANCEF) 2-4 GM/100ML-% IVPB    Comments:  Hazlip, Jessica   : cabinet override      12/07/16 0644 12/07/16 0853   12/07/16 0637  ceFAZolin (ANCEF) IVPB 2g/100 mL premix     2 g 200 mL/hr over 30 Minutes Intravenous On call to O.R. 12/07/16 4098 12/07/16 1191       Patient was given sequential compression devices, early ambulation, and chemoprophylaxis to prevent DVT.  Patient benefited maximally from hospital stay and there were no complications.    Recent vital signs: Patient Vitals for the past 24 hrs:  BP Temp Temp src Pulse Resp SpO2  12/08/16 0759 108/73 98.4 F (36.9 C) Oral 77 18 98 %  12/08/16 0400 110/75 97.7 F (36.5 C) Oral 89 18  94 %  12/07/16 2343 122/81 98.6 F (37 C) Oral 83 20 100 %  12/07/16 2000 (!) 132/97 98.3 F (36.8 C) Oral 80 20 99 %  12/07/16 1745 (!) 131/92 97.9 F (36.6 C) - 88 18 98 %  12/07/16 1351 120/83 97.8 F (36.6 C) - 90 18 95 %  12/07/16 1230 135/87 - - 72 17 100 %  12/07/16 1100 - - - 78 15 96 %  12/07/16 1045 - - - 85 17 97 %  12/07/16 1030 - - - 83 11 97 %  12/07/16 1000 (!) 129/98 - - 77 17 96 %  12/07/16 0945 (!) 130/96 - - 88 12 96 %  12/07/16 0930 - - - 83 10 99 %  12/07/16 0921 124/89 97.9 F (36.6 C) - 87 18 98 %     Recent laboratory studies:  Recent Labs  12/07/16 0647 12/07/16 0648  WBC 8.7  --   HGB 12.7*  --   HCT 38.0*  --   PLT 239  --   NA  --  141  K  --  3.9  CL  --  110  CO2  --  25  BUN  --  10  CREATININE  --  1.35*  GLUCOSE  --  106*  CALCIUM  --  8.9     Discharge Medications:   Allergies as of 12/08/2016   No Known Allergies  Medication List    TAKE these medications   cyclobenzaprine 10 MG tablet Commonly known as:  FLEXERIL Take 1 tablet (10 mg total) by mouth 2 (two) times daily as needed for muscle spasms.   lithium carbonate 450 MG CR tablet Commonly known as:  ESKALITH Take 450 mg by mouth every morning.   lithium 600 MG capsule Take 600 mg by mouth at bedtime.   mirtazapine 45 MG tablet Commonly known as:  REMERON Take 45 mg by mouth at bedtime.   nitroGLYCERIN 0.2 mg/hr patch Commonly known as:  NITRODUR - Dosed in mg/24 hr Place 1 patch (0.2 mg total) onto the skin daily.   oxyCODONE-acetaminophen 5-325 MG tablet Commonly known as:  PERCOCET/ROXICET Take 1 tablet by mouth every 4 (four) hours as needed for severe pain.   QUEtiapine 400 MG 24 hr tablet Commonly known as:  SEROQUEL XR Take 800 mg by mouth at bedtime.            Discharge Care Instructions        Start     Ordered   12/08/16 0000  Discharge patient    Question Answer Comment  Discharge disposition 01-Home or Self Care   Discharge  patient date 12/08/2016      12/08/16 0827   12/07/16 0000  Call MD / Call 911    Comments:  If you experience chest pain or shortness of breath, CALL 911 and be transported to the hospital emergency room.  If you develope a fever above 101 F, pus (white drainage) or increased drainage or redness at the wound, or calf pain, call your surgeon's office.   12/07/16 0913   12/07/16 0000  Diet - low sodium heart healthy     12/07/16 0913   12/07/16 0000  Constipation Prevention    Comments:  Drink plenty of fluids.  Prune juice may be helpful.  You may use a stool softener, such as Colace (over the counter) 100 mg twice a day.  Use MiraLax (over the counter) for constipation as needed.   12/07/16 0913   12/07/16 0000  Increase activity slowly as tolerated     12/07/16 0913   12/07/16 0000  oxyCODONE-acetaminophen (PERCOCET/ROXICET) 5-325 MG tablet  Every 4 hours PRN     12/07/16 0913      Diagnostic Studies: Dg Chest 2 View  Result Date: 12/07/2016 CLINICAL DATA:  Left Achilles debridement. Recent pneumonia. Preoperative chest x-ray. EXAM: CHEST  2 VIEW COMPARISON:  05/28/2016.  01/03/2016. FINDINGS: Mediastinum and hilar structures are normal. Interim resolution of pulmonary infiltrates with mild persistent bibasilar atelectasis. Tiny right pleural effusion noted. No pneumothorax. Cardiomegaly with mild pulmonary venous congestion. Degenerative changes thoracic spine. IMPRESSION: 1. Interim resolution of pulmonary infiltrates. Persistent bibasilar atelectasis. Small right pleural effusion. 2. Cardiomegaly with mild pulmonary venous congestion Electronically Signed   By: Maisie Fus  Register   On: 12/07/2016 07:15    Disposition: 01-Home or Self Care  Discharge Instructions    Call MD / Call 911    Complete by:  As directed    If you experience chest pain or shortness of breath, CALL 911 and be transported to the hospital emergency room.  If you develope a fever above 101 F, pus (white drainage) or  increased drainage or redness at the wound, or calf pain, call your surgeon's office.   Constipation Prevention    Complete by:  As directed    Drink plenty of fluids.  Prune juice may be helpful.  You may use a stool softener, such as Colace (over the counter) 100 mg twice a day.  Use MiraLax (over the counter) for constipation as needed.   Diet - low sodium heart healthy    Complete by:  As directed    Discharge patient    Complete by:  As directed    Discharge disposition:  01-Home or Self Care   Discharge patient date:  12/08/2016   Increase activity slowly as tolerated    Complete by:  As directed       Follow-up Information    Nadara Mustard, MD Follow up in 1 week(s).   Specialty:  Orthopedic Surgery Contact information: [address] [place] [address] [phone]            Signed: Kathryne Hitch 12/08/2016, 8:27 AM

## 2016-12-08 NOTE — Progress Notes (Signed)
Physical Therapy Treatment Patient Details Name: Justin [name] MRN: [id number] DOB: October 07, 1965 Today's Date: 12/08/2016    History of Present Illness Pt is a 51 y/o male s/p debridement of L achilles with removal of necrotic tendon, secondary reconstruction and wound VAC application. PMH including but not limited to Bipolar disorder, L achilles tendon sx in 2016, hx of craniotomy in 2014.    PT Comments    Patient seen for mobility progression s/p LE surgery. Mobilizing well. Performed stair negotiation today. Educated patient on precautions, mobility expectations, safety and car transfers. Will sign off.    Follow Up Recommendations  No PT follow up;Supervision for mobility/OOB     Equipment Recommendations  Rolling walker with 5" wheels    Recommendations for Other Services       Precautions / Restrictions Precautions Precautions: Fall Required Braces or Orthoses: Other Brace/Splint Other Brace/Splint: CAM boot Restrictions Weight Bearing Restrictions: Yes LLE Weight Bearing: Weight bearing as tolerated    Mobility  Bed Mobility Overal bed mobility: Modified Independent                Transfers Overall transfer level: Needs assistance Equipment used: Rolling walker (2 wheeled) Transfers: Sit to/from Stand Sit to Stand: Supervision         General transfer comment: increased time and effort, supervision for safety  Ambulation/Gait Ambulation/Gait assistance: Modified independent (Device/Increase time) Ambulation Distance (Feet): 60 Feet Assistive device: Rolling walker (2 wheeled) Gait Pattern/deviations: Step-to pattern;Step-through pattern;Decreased step length - right;Decreased stance time - left;Decreased stride length;Decreased weight shift to left Gait velocity: decreased Gait velocity interpretation: Below normal speed for age/gender General Gait Details: reliance on RW for stability   Stairs Stairs: Yes   Stair Management: Sideways Number  of Stairs: 5 General stair comments: VCs for technique and sequencing, no physical assist required  Wheelchair Mobility    Modified Rankin (Stroke Patients Only)       Balance Overall balance assessment: Needs assistance Sitting-balance support: Feet supported Sitting balance-Leahy Scale: Good     Standing balance support: During functional activity;Bilateral upper extremity supported Standing balance-Leahy Scale: Poor Standing balance comment: Reliance on UE support                            Cognition Arousal/Alertness: Awake/alert Behavior During Therapy: WFL for tasks assessed/performed Overall Cognitive Status: Within Functional Limits for tasks assessed                                        Exercises      General Comments        Pertinent Vitals/Pain Pain Assessment: 0-10 Pain Score: 6  Pain Location: L ankle Pain Descriptors / Indicators: Sore Pain Intervention(s): Monitored during session    Home Living                      Prior Function            PT Goals (current goals can now be found in the care plan section) Acute Rehab PT Goals Patient Stated Goal: return to independence PT Goal Formulation: With patient Time For Goal Achievement: 12/14/16 Potential to Achieve Goals: Good Progress towards PT goals: Progressing toward goals    Frequency    Min 5X/week      PT Plan Current plan remains appropriate    Co-evaluation  AM-PAC PT "6 Clicks" Daily Activity  Outcome Measure  Difficulty turning over in bed (including adjusting bedclothes, sheets and blankets)?: None Difficulty moving from lying on back to sitting on the side of the bed? : A Little Difficulty sitting down on and standing up from a chair with arms (e.g., wheelchair, bedside commode, etc,.)?: A Little Help needed moving to and from a bed to chair (including a wheelchair)?: None Help needed walking in hospital room?: A  Little Help needed climbing 3-5 steps with a railing? : A Little 6 Click Score: 20    End of Session Equipment Utilized During Treatment: Gait belt Activity Tolerance: Patient tolerated treatment well Patient left: in bed;with call bell/phone within reach Nurse Communication: Mobility status PT Visit Diagnosis: Other abnormalities of gait and mobility (R26.89);Pain Pain - Right/Left: Left Pain - part of body: Ankle and joints of foot     Time: 0718-0736 PT Time Calculation (min) (ACUTE ONLY): 18 min  Charges:  $Gait Training: 8-22 mins                    G Codes:  Functional Assessment Tool Used: AM-PAC 6 Clicks Basic Mobility;Clinical judgement Functional Limitation: Mobility: Walking and moving around Mobility: Walking and Moving Around Current Status (Z6109): At least 40 percent but less than 60 percent impaired, limited or restricted Mobility: Walking and Moving Around Goal Status [phone]): At least 1 percent but less than 20 percent impaired, limited or restricted    Charlotte Crumb, PT DPT  Board Certified Neurologic Specialist [phone]    Justin [name] 12/08/2016, 9:00 AM

## 2016-12-08 NOTE — Progress Notes (Signed)
Patient ID: Justin [name], male   DOB: 06-26-1965, 51 y.o.   MRN: [id number] Doing well this am.  Left lower extremity boot in place and portable VAC with good seal.  Can be discharged to home today.

## 2016-12-08 NOTE — Progress Notes (Signed)
Patient is discharged from room 3C10 at this time. Alert and in stable condition. IV site d/c'd and instructions read to patient with understanding verbalized. Patient voiced concern about not having a ride home and requested for assistance. Social worker notified and a cab voucher was given. Patient left unit via wheelchair with all belongings at side.

## 2016-12-08 NOTE — Progress Notes (Signed)
Clinical Social Worker was contacted by patient RN and RN stated patient has been discharge but needs assistance with transportation. RN stated patient will need a cab voucher due to patient needing assistance ambulating. CSW assisted patient with a cab voucher and gave voucher to RN. CSW signing off as patient no longer has social work needs.   Marrianne Mood, MSW,  [employer] [phone]

## 2016-12-10 ENCOUNTER — Telehealth (INDEPENDENT_AMBULATORY_CARE_PROVIDER_SITE_OTHER): Payer: Self-pay | Admitting: Radiology

## 2016-12-10 NOTE — Telephone Encounter (Signed)
Patient is calling, states that he has questions about his wound vac. He states that it does not look like it is pulling anything out of the wound and he can't tell if it is even on.  Please call him back @ [phone].

## 2016-12-10 NOTE — Telephone Encounter (Signed)
I called and advised patient that if the lights are on that this maybe because this is not a high draining surgical procedure. Advised him to make sure lights were on. Advised that if he is having more problems we are happy to see in the office. Listened to wound vac over the phone, did not hear anything alarming.

## 2016-12-18 ENCOUNTER — Ambulatory Visit (INDEPENDENT_AMBULATORY_CARE_PROVIDER_SITE_OTHER): Payer: Medicaid Other | Admitting: Orthopedic Surgery

## 2016-12-18 ENCOUNTER — Encounter (INDEPENDENT_AMBULATORY_CARE_PROVIDER_SITE_OTHER): Payer: Self-pay | Admitting: Orthopedic Surgery

## 2016-12-18 DIAGNOSIS — M7662 Achilles tendinitis, left leg: Secondary | ICD-10-CM

## 2016-12-18 NOTE — Progress Notes (Signed)
Office Visit Note   Patient: Justin [name]           Date of Birth: [birth year]           MRN: [id number] Visit Date: 12/18/2016              Requested by: [facility] [address] [place], [place] [address] PCP: [facility]  Chief Complaint  Patient presents with  . Left Ankle - Follow-up      HPI: Patient is 2 weeks status post debridement left Achilles tendon. Patient was placed in a wound VAC and a fracture boot. Patient has taken off the wound VAC on his own first to the pump off left dressing in place and then just recently took the dressing off. Patient has been ambulating without the fracture boot.  Assessment & Plan: Visit Diagnoses:  1. Achilles tendinitis, left leg     Plan: We will harvest the sutures a recommend the importance of wearing the fracture boot for all activities of daily living. Continue elevation work on scar massage  Follow-Up Instructions: Return in about 4 weeks (around 01/15/2017).   Ortho Exam  Patient is alert, oriented, no adenopathy, well-dressed, normal affect, normal respiratory effort. Examination the incision is healed quite nicely there is no redness no cellulitis no wound dehiscence no signs of infection. The Achilles is nontender to palpation he has good range of motion of the ankle there is no swelling. Patient states that his Achilles tendon is feeling much better.  Imaging: No results found. No images are attached to the encounter.  Labs: Lab Results  Component Value Date   REPTSTATUS 01/11/2013 FINAL 01/04/2013   CULT  01/04/2013    NO GROWTH 5 DAYS Performed at [facility]    Orders:  No orders of the defined types were placed in this encounter.  No orders of the defined types were placed in this encounter.    Procedures: No procedures performed  Clinical Data: No additional findings.  ROS:  All other systems negative, except as noted in the HPI. Review of Systems  Objective: Vital  Signs: There were no vitals taken for this visit.  Specialty Comments:  No specialty comments available.  PMFS History: Patient Active Problem List   Diagnosis Date Noted  . Achilles tendinitis of left lower extremity 12/07/2016  . Achilles tendinitis, left leg 05/17/2016  . Obstructive sleep apnea on CPAP 03/07/2015  . Post-operative state 03/07/2015  . Achilles rupture, left 10/21/2014  . OSA (obstructive sleep apnea) 04/23/2013  . Bipolar disorder, unspecified (HCC) 01/07/2013  . Sinus tachycardia 01/07/2013  . Headache(784.0) 01/07/2013  . SDH (subdural hematoma) (HCC) 01/04/2013  . H/O PE 01/04/2013  . Chronic anticoagulation 01/04/2013  . 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  . Acute sinusitis 01/04/2013   Past Medical History:  Diagnosis Date  . Achilles rupture, left   . Bipolar 1 disorder (HCC)   . Dental caries    periodontitis  . Pneumonia   . Pulmonary embolism (HCC)   . Sleep apnea    wears CPAP  . Subdural hematoma (HCC) 2015    Family History  Problem Relation Age of Onset  . Hypertension Mother   . Stroke Mother   . Multiple sclerosis Sister   . Down syndrome Son     Past Surgical History:  Procedure Laterality Date  . ACHILLES TENDON SURGERY Left 10/21/2014   Procedure: Left Achilles Reconstruction;  Surgeon: Nadara Mustard, MD;  Location: [facility] OR;  Service: Orthopedics;  Laterality: Left;  . APPENDECTOMY    . CRANIOTOMY N/A 01/19/2013   Procedure: CRANIOTOMY HEMATOMA EVACUATION SUBDURAL;  Surgeon: Hewitt Shorts, MD;  Location: [facility];  Service: Neurosurgery;  Laterality: N/A;  . CYSTECTOMY     right head  . ELBOW SURGERY     right  . FRACTURE SURGERY     finger  . I&D EXTREMITY Left 12/07/2016   Procedure: LEFT ACHILLES DEBRIDEMENT;  Surgeon: Nadara Mustard, MD;  Location: [facility] OR;  Service: Orthopedics;  Laterality: Left;  Marland Kitchen MULTIPLE EXTRACTIONS WITH ALVEOLOPLASTY N/A 03/07/2015   Procedure: MULTIPLE EXTRACTION WITH  ALVEOLOPLASTY;  Surgeon: [name], DDS;  Location: [facility] OR;  Service: Oral Surgery;  Laterality: N/A;   Social History   Occupational History  . disability    Social History Main Topics  . Smoking status: Never Smoker  . Smokeless tobacco: Never Used  . Alcohol use No  . Drug use: No  . Sexual activity: Not on file

## 2017-01-09 ENCOUNTER — Emergency Department (HOSPITAL_COMMUNITY)
Admission: EM | Admit: 2017-01-09 | Discharge: 2017-01-09 | Disposition: A | Discharge status: Home / Self Care | Payer: Medicaid Other | Attending: Emergency Medicine | Admitting: Emergency Medicine

## 2017-01-09 ENCOUNTER — Encounter (HOSPITAL_COMMUNITY): Payer: Self-pay | Admitting: *Deleted

## 2017-01-09 ENCOUNTER — Emergency Department (HOSPITAL_BASED_OUTPATIENT_CLINIC_OR_DEPARTMENT_OTHER): Admit: 2017-01-09 | Discharge: 2017-01-09 | Disposition: A | Discharge status: Home / Self Care | Payer: Medicaid Other

## 2017-01-09 DIAGNOSIS — Z79899 Other long term (current) drug therapy: Secondary | ICD-10-CM | POA: Insufficient documentation

## 2017-01-09 DIAGNOSIS — M7989 Other specified soft tissue disorders: Secondary | ICD-10-CM

## 2017-01-09 DIAGNOSIS — M79609 Pain in unspecified limb: Secondary | ICD-10-CM

## 2017-01-09 DIAGNOSIS — M722 Plantar fascial fibromatosis: Secondary | ICD-10-CM | POA: Diagnosis not present

## 2017-01-09 DIAGNOSIS — M7661 Achilles tendinitis, right leg: Secondary | ICD-10-CM | POA: Diagnosis not present

## 2017-01-09 DIAGNOSIS — M79671 Pain in right foot: Secondary | ICD-10-CM | POA: Diagnosis present

## 2017-01-09 MED ORDER — MELOXICAM 15 MG PO TABS: 15.0000 mg | ORAL_TABLET | Freq: Every day | ORAL | 0 refills | Status: DC

## 2017-01-09 MED ORDER — KETOROLAC TROMETHAMINE 30 MG/ML IJ SOLN
30.0000 mg | Freq: Once | INTRAMUSCULAR | Status: AC
  Administered 2017-01-09: 30 mg via INTRAMUSCULAR
  Filled 2017-01-09: qty 1

## 2017-01-09 NOTE — ED Provider Notes (Signed)
[facility] EMERGENCY DEPARTMENT Provider Note   CSN: [id number] Arrival date & time: 01/09/17  1318     History   Chief Complaint Chief Complaint  Patient presents with  . Foot Pain  . Leg Pain    HPI Justin [name] is a 51 y.o. male with a PMHx of remote PE not on anticoagulation, bipolar 1 disorder, L sided achilles tendon rupture s/p repair/reconstruction in 10/21/14 and subsequent intractable achilles tendinitis s/p debridement 12/07/16 by Dr. Lajoyce Corners, who presents to the ED with complaints of gradual onset right foot/heel pain that began 3 days ago. Patient denies any injury or trauma however he has been using a Personal assistant on the left foot since his surgery last month, and has had to compensate with his right leg. He describes the pain as 9/10 constant crampy right heel pain that radiates up the achilles tendon area into the the posterior calf, worse with walking, and with no treatments tried prior to arrival. He denies any swelling, erythema or warmth, skin changes, fevers, chills, CP, SOB, abd pain, N/V/D/C, hematuria, dysuria, numbness, tingling, focal weakness, or any other complaints at this time. Sees Dr. Lajoyce Corners for his left ankle, has an appt on 01/15/17.    The history is provided by the patient and medical records. No language interpreter was used.  Foot Pain  This is a new problem. The current episode started more than 2 days ago. The problem occurs constantly. The problem has not changed since onset.Pertinent negatives include no chest pain, no abdominal pain and no shortness of breath. The symptoms are aggravated by walking. Nothing relieves the symptoms. He has tried nothing for the symptoms. The treatment provided no relief.  Leg Pain   Pertinent negatives include no numbness.    Past Medical History:  Diagnosis Date  . Achilles rupture, left   . Bipolar 1 disorder (HCC)   . Dental caries    periodontitis  . Pneumonia   . Pulmonary embolism (HCC)    . Sleep apnea    wears CPAP  . Subdural hematoma (HCC) 2015    Patient Active Problem List   Diagnosis Date Noted  . Achilles tendinitis of left lower extremity 12/07/2016  . Achilles tendinitis, left leg 05/17/2016  . Obstructive sleep apnea on CPAP 03/07/2015  . Post-operative state 03/07/2015  . Achilles rupture, left 10/21/2014  . OSA (obstructive sleep apnea) 04/23/2013  . Bipolar disorder, unspecified (HCC) 01/07/2013  . Sinus tachycardia 01/07/2013  . Headache(784.0) 01/07/2013  . SDH (subdural hematoma) (HCC) 01/04/2013  . H/O PE 01/04/2013  . Chronic anticoagulation 01/04/2013  . 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  . Acute sinusitis 01/04/2013    Past Surgical History:  Procedure Laterality Date  . ACHILLES TENDON SURGERY Left 10/21/2014   Procedure: Left Achilles Reconstruction;  Surgeon: Nadara Mustard, MD;  Location: [facility] OR;  Service: Orthopedics;  Laterality: Left;  . APPENDECTOMY    . CRANIOTOMY N/A 01/19/2013   Procedure: CRANIOTOMY HEMATOMA EVACUATION SUBDURAL;  Surgeon: Hewitt Shorts, MD;  Location: [facility];  Service: Neurosurgery;  Laterality: N/A;  . CYSTECTOMY     right head  . ELBOW SURGERY     right  . FRACTURE SURGERY     finger  . I&D EXTREMITY Left 12/07/2016   Procedure: LEFT ACHILLES DEBRIDEMENT;  Surgeon: Nadara Mustard, MD;  Location[facility];  Service: Orthopedics;  Laterality: Left;  Marland Kitchen MULTIPLE EXTRACTIONS WITH ALVEOLOPLASTY N/A 03/07/2015   Procedure: MULTIPLE EXTRACTION WITH  ALVEOLOPLASTY;  Surgeon: [name], DDS;  Location: [facility] OR;  Service: Oral Surgery;  Laterality: N/A;       Home Medications    Prior to Admission medications   Medication Sig Start Date End Date Taking? Authorizing Provider  cyclobenzaprine (FLEXERIL) 10 MG tablet Take 1 tablet (10 mg total) by mouth 2 (two) times daily as needed for muscle spasms. 06/23/16   Tegeler, Canary Brim, MD  lithium 600 MG capsule Take 600 mg by mouth at bedtime.     [provider]  lithium carbonate (ESKALITH) 450 MG CR tablet Take 450 mg by mouth every morning. 01/11/16   [provider]  mirtazapine (REMERON) 45 MG tablet Take 45 mg by mouth at bedtime.    [provider]  nitroGLYCERIN (NITRODUR - DOSED IN MG/24 HR) 0.2 mg/hr patch Place 1 patch (0.2 mg total) onto the skin daily. 10/24/16   Adonis Huguenin, NP  oxyCODONE-acetaminophen (PERCOCET/ROXICET) 5-325 MG tablet Take 1 tablet by mouth every 4 (four) hours as needed for severe pain. 12/07/16   Nadara Mustard, MD  QUEtiapine (SEROQUEL XR) 400 MG 24 hr tablet Take 800 mg by mouth at bedtime.    [provider]    Family History Family History  Problem Relation Age of Onset  . Hypertension Mother   . Stroke Mother   . Multiple sclerosis Sister   . Down syndrome Son     Social History Social History  Substance Use Topics  . Smoking status: Never Smoker  . Smokeless tobacco: Never Used  . Alcohol use No     Allergies   Patient has no known allergies.   Review of Systems Review of Systems  Constitutional: Negative for chills and fever.  Respiratory: Negative for shortness of breath.   Cardiovascular: Negative for chest pain.  Gastrointestinal: Negative for abdominal pain, constipation, diarrhea, nausea and vomiting.  Genitourinary: Negative for dysuria and hematuria.  Musculoskeletal: Positive for arthralgias and myalgias. Negative for joint swelling.  Skin: Negative for color change.  Allergic/Immunologic: Negative for immunocompromised state.  Neurological: Negative for weakness and numbness.  Psychiatric/Behavioral: Negative for confusion.   All other systems reviewed and are negative for acute change except as noted in the HPI.    Physical Exam Updated Vital Signs BP 127/89 (BP Location: Right Arm)   Pulse 92   Temp 98 F (36.7 C) (Oral)   Resp 20   Ht 5\' 9"  (1.753 m)   Wt 106.6 kg (235 lb)   SpO2 99%   BMI 34.70 kg/m   Physical  Exam  Constitutional: He is oriented to person, place, and time. Vital signs are normal. He appears well-developed and well-nourished.  Non-toxic appearance. No distress.  Afebrile, nontoxic, NAD  HENT:  Head: Normocephalic and atraumatic.  Mouth/Throat: Mucous membranes are normal.  Eyes: Conjunctivae and EOM are normal. Right eye exhibits no discharge. Left eye exhibits no discharge.  Neck: Normal range of motion. Neck supple.  Cardiovascular: Normal rate and intact distal pulses.   Pulmonary/Chest: Effort normal. No respiratory distress.  Abdominal: Normal appearance. He exhibits no distension.  Musculoskeletal: Normal range of motion.       Right ankle: He exhibits normal range of motion, no swelling, no ecchymosis, no deformity, no laceration and normal pulse. Tenderness. Achilles tendon normal.       Feet:  R ankle with FROM intact, no swelling, no crepitus or deformity, with mild TTP of the plantar aspect of the foot near the insertion site  of the plantar fascia, and mild TTP to insertion of achilles tendon; no TTP or swelling of fore foot or calf, no focal bony TTP to entire RLE, no pedal edema and neg homan's sign. No break in skin. No bruising or erythema. No warmth. Achilles intact. Good pedal pulse and cap refill of all toes. Wiggling toes without difficulty. Sensation grossly intact. Soft compartments  LLE in CAM walker  Neurological: He is alert and oriented to person, place, and time. He has normal strength. No sensory deficit.  Skin: Skin is warm, dry and intact. No rash noted.  Psychiatric: He has a normal mood and affect.  Nursing note and vitals reviewed.    ED Treatments / Results  Labs (all labs ordered are listed, but only abnormal results are displayed) Labs Reviewed - No data to display  EKG  EKG Interpretation None       Radiology No results found.   Duplex LE U/S Right Leg 01/09/17: Narrative  LOWER VENOUS (DVT)  Indication: Pain Swelling Risk  Factors: Hx PE. Examination Guidelines: A complete evaluation includes B-mode imaging, spectral doppler, color doppler, and power doppler as needed of all accessible portions of each vessel. Bilateral testing is considered an integral part of a complete examination. Limited examinations for reoccurring indications may be performed as noted.     Right Duplex Findings: All veins visualized appear fully compressible. Doppler flow signals demonstrate normal spontaneity, phasicity, and augmentation. Left Duplex Findings: CFV Doppler waveform appears appropriately phasic.  Right Technical Findings: No evidence of deep venous obstruction in the lower extremity. No indirect evidence of obstruction proximal to the inguinal ligament.  Left Technical Findings: No evidence of iliofemoral obstruction.  Final Interpretation Right  There is no evidence of deep vein thrombosis in the lower extremity. Enlarged lymph nodes visualized in the groin.  *See table(s) above for measurements and observations.     Procedures Procedures (including critical care time)  Medications Ordered in ED Medications  ketorolac (TORADOL) 30 MG/ML injection 30 mg (30 mg Intramuscular Given 01/09/17 1727)     Initial Impression / Assessment and Plan / ED Course  I have reviewed the triage vital signs and the nursing notes.  Pertinent labs & imaging results that were available during my care of the patient were reviewed by me and considered in my medical decision making (see chart for details).     51 y.o. male here with R foot and leg pain x3 days, no injury or trauma, however he's in a CAM walker for his L ankle due to recent achilles surgery, so he's had to compensate with the right leg. On exam, mild tenderness at insertion of plantar fascia at the heel, and tenderness to achilles tendon insertion site; no swelling, no overlying skin changes, neg homan's sign, NVI with soft compartments, no focal bony  TTP. DVT U/S done and was negative. Doubt xrays would be helpful in today's evaluation, likely tendinitis from overuse/compensation of the right leg due to being in CAM walker. Advised RICE, water bottle ice/stretch/massage, and will start on mobic. Advised use of tylenol as well. F/up with his orthopedist next week at his already scheduled appt for recheck and ongoing management.  I explained the diagnosis and have given explicit precautions to return to the ER including for any other new or worsening symptoms. The patient understands and accepts the medical plan as it's been dictated and I have answered their questions. Discharge instructions concerning home care and prescriptions have been given. The patient  is STABLE and is discharged to home in good condition.    Final Clinical Impressions(s) / ED Diagnoses   Final diagnoses:  Achilles tendinitis, right leg  Plantar fasciitis, right    New Prescriptions New Prescriptions   MELOXICAM (MOBIC) 15 MG TABLET    Take 1 tablet (15 mg total) by mouth daily. TAKE WITH [address] [place], [place] 01/09/17 1747    Lavera Guise, MD 01/10/17 [phone]

## 2017-01-09 NOTE — ED Triage Notes (Signed)
Pt reports having surgery on left leg approx one month ago. Now has pain to right bottom of his foot and radiates up into his calf and knee. Denies injury. Has hx of PE, not on blood thinners.

## 2017-01-09 NOTE — Discharge Instructions (Signed)
Use frozen water bottle to ice and stretch/massage the arch of your foot and the achilles tendon. Elevate your leg throughout the day.  Use mobic daily as directed, don't use additional NSAIDs like ibuprofen/aleve/etc while taking mobic. Use additional tylenol as needed for additional pain relief.  Follow up with your orthopedist in 1 week for recheck of symptoms and ongoing management of your condition. Return to the ER for changes or worsening symptoms.

## 2017-01-09 NOTE — Progress Notes (Signed)
RLE venous duplex has been completed. Preliminary results can be found: Chart review -> CV Proc    Farrel Demark, RDMS, RVT 01/09/2017

## 2017-01-15 ENCOUNTER — Ambulatory Visit (INDEPENDENT_AMBULATORY_CARE_PROVIDER_SITE_OTHER): Payer: Medicaid Other | Admitting: Orthopedic Surgery

## 2017-01-15 ENCOUNTER — Encounter (INDEPENDENT_AMBULATORY_CARE_PROVIDER_SITE_OTHER): Payer: Self-pay | Admitting: Orthopedic Surgery

## 2017-01-15 DIAGNOSIS — M7662 Achilles tendinitis, left leg: Secondary | ICD-10-CM

## 2017-01-15 NOTE — Progress Notes (Signed)
Office Visit Note   Patient: Justin MomentRobert C [name]           Date of Birth: 26-Sep-1965           MRN: [id number] Visit Date: 01/15/2017              Requested by: Alain MarionPa, [facility] [address] [place], [place] [id number] PCP: Pa, [facility]  Chief Complaint  Patient presents with  . Left Foot - Routine Post Op      HPI: Patient presents in follow-up status post left Achilles tendon reconstruction.  Patient is ambulating in the fracture boot he states that he feels well does not have any tendon pain but occasionally has some sharp pain that radiates proximally.  Patient states he is not on any fluid pills.  Assessment & Plan: Visit Diagnoses:  1. Achilles tendinitis, left leg     Plan: We will advance him to an ASO for activities daily living reevaluate in 4 weeks.  Follow-Up Instructions: Return in about 4 weeks (around 02/12/2017).   Ortho Exam  Patient is alert, oriented, no adenopathy, well-dressed, normal affect, normal respiratory effort. Examination there is less swelling along the Achilles there is no tenderness to palpation no nodular changes the thickening of the surgical incision scar is decreasing.  He has good range of motion of his ankle with lateral flexion 40 degrees dorsiflexion 20 degrees past neutral.  Is no redness no cellulitis no swelling no tenderness to palpation.  Imaging: No results found. No images are attached to the encounter.  Labs: Lab Results  Component Value Date   REPTSTATUS 01/11/2013 FINAL 01/04/2013   CULT  01/04/2013    NO GROWTH 5 DAYS Performed at [facility]    Orders:  No orders of the defined types were placed in this encounter.  No orders of the defined types were placed in this encounter.    Procedures: No procedures performed  Clinical Data: No additional findings.  ROS:  All other systems negative, except as noted in the HPI. Review of Systems  Objective: Vital Signs: There were no vitals taken  for this visit.  Specialty Comments:  No specialty comments available.  PMFS History: Patient Active Problem List   Diagnosis Date Noted  . Achilles tendinitis of left lower extremity 12/07/2016  . Achilles tendinitis, left leg 05/17/2016  . Obstructive sleep apnea on CPAP 03/07/2015  . Post-operative state 03/07/2015  . Achilles rupture, left 10/21/2014  . OSA (obstructive sleep apnea) 04/23/2013  . Bipolar disorder, unspecified (HCC) 01/07/2013  . Sinus tachycardia 01/07/2013  . Headache(784.0) 01/07/2013  . SDH (subdural hematoma) (HCC) 01/04/2013  . H/O PE 01/04/2013  . Chronic anticoagulation 01/04/2013  . 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  . Acute sinusitis 01/04/2013   Past Medical History:  Diagnosis Date  . Achilles rupture, left   . Bipolar 1 disorder (HCC)   . Dental caries    periodontitis  . Pneumonia   . Pulmonary embolism (HCC)   . Sleep apnea    wears CPAP  . Subdural hematoma (HCC) 2015    Family History  Problem Relation Age of Onset  . Hypertension Mother   . Stroke Mother   . Multiple sclerosis Sister   . Down syndrome Son     Past Surgical History:  Procedure Laterality Date  . ACHILLES TENDON SURGERY Left 10/21/2014   Procedure: Left Achilles Reconstruction;  Surgeon: Nadara MustardMarcus Birdie Beveridge V, MD;  [facility];  Service: Orthopedics;  Laterality: Left;  .  APPENDECTOMY    . CRANIOTOMY N/A 01/19/2013   Procedure: CRANIOTOMY HEMATOMA EVACUATION SUBDURAL;  Surgeon: Hewitt Shorts, MD;  Location: [facility] NEUR[facility] ORS;  Service: Neurosurgery;  Laterality: N/A;  . CYSTECTOMY     right head  . ELBOW SURGERY     right  . FRACTURE SURGERY     finger  . I&D EXTREMITY Left 12/07/2016   Procedure: LEFT ACHILLES DEBRIDEMENT;  Surgeon: Nadara Mustard, MD;  Location[facility] OR;  Service: Orthopedics;  Laterality: Left;  Marland Kitchen MULTIPLE EXTRACTIONS WITH ALVEOLOPLASTY N/A 03/07/2015   Procedure: MULTIPLE EXTRACTION WITH ALVEOLOPLASTY;  Surgeon: [name], DDS;   Location: [facility] OR;  Service: Oral Surgery;  Laterality: N/A;   Social History   Occupational History  . disability    Social History Main Topics  . Smoking status: Never Smoker  . Smokeless tobacco: Never Used  . Alcohol use No  . Drug use: No  . Sexual activity: Not on file

## 2017-02-12 ENCOUNTER — Ambulatory Visit (INDEPENDENT_AMBULATORY_CARE_PROVIDER_SITE_OTHER): Payer: Medicaid Other

## 2017-02-12 ENCOUNTER — Encounter (INDEPENDENT_AMBULATORY_CARE_PROVIDER_SITE_OTHER): Payer: Self-pay | Admitting: Orthopedic Surgery

## 2017-02-12 ENCOUNTER — Ambulatory Visit (INDEPENDENT_AMBULATORY_CARE_PROVIDER_SITE_OTHER): Payer: Medicaid Other | Admitting: Orthopedic Surgery

## 2017-02-12 DIAGNOSIS — M7662 Achilles tendinitis, left leg: Secondary | ICD-10-CM

## 2017-02-12 DIAGNOSIS — M79661 Pain in right lower leg: Secondary | ICD-10-CM

## 2017-02-12 NOTE — Progress Notes (Signed)
Office Visit Note   Patient: Justin MomentRobert C Cunningham           Date of Birth: [birth year]           MRN: [id number] Visit Date: 02/12/2017              Requested by: Alain MarionPa, [facility] [address] [place], [place] [id number] PCP: Pa, [facility]  Chief Complaint  Patient presents with  . Right Ankle - Routine Post Op  . Right Leg - Pain      HPI: Patient is a 51 year old gentleman who presents for 2 separate issues #1 he is status post Achilles tendon reconstruction on the left.  He states his left leg feels like he never had anything wrong with it he states that it feels normal.  Patient states he has been having pain in the right leg at this time he is having pain in the medial and lateral aspect of his leg that radiates up to the popliteal fossa.  Patient states he has had an ultrasound which was negative for DVT.  Patient denies any radicular pain from his back.  Patient states he has pain with activities pain at rest.  Assessment & Plan: Visit Diagnoses:  1. Achilles tendinitis, left leg   2. Pain in right lower leg     Plan: Recommended ibuprofen 600 mg 3 times a day for 2 weeks for the right leg pain.  Discussed that if this got worse or change to follow-up and we will reevaluate.  Follow-Up Instructions: No Follow-up on file.   Ortho Exam  Patient is alert, oriented, no adenopathy, well-dressed, normal affect, normal respiratory effort. Skin patient has a normal gait.  He has no pain with range of motion of the left ankle.  Examination the right leg he does have some tenderness to palpation along the medial border of the tibia consistent with periostitis.  He has some tenderness to palpation over both the medial and lateral muscle groups.  There is no tightness no signs of compartment syndrome.  The veins are nontender to palpation he has no pain with dorsiflexion of the ankle.  The Achilles tendon is nontender to palpation.  He has a negative straight leg raise.  He has good  motor strength in all motor groups of the right lower extremity.  Patient has anterior compartment pain with dorsiflexion of the ankle patient has no radiating pain distally no signs of impingement of the superficial peroneal nerve.  Imaging: No results found. No images are attached to the encounter.  Labs: Lab Results  Component Value Date   REPTSTATUS 01/11/2013 FINAL 01/04/2013   CULT  01/04/2013    NO GROWTH 5 DAYS Performed at [facility]    @[facility]SALL[facility]UES [facility]@  @BMI1 @  Orders:  Orders Placed This Encounter  Procedures  . XR Tibia/Fibula Right   No orders of the defined types were placed in this encounter.    Procedures: No procedures performed  Clinical Data: No additional findings.  ROS:  All other systems negative, except as noted in the HPI. Review of Systems  Objective: Vital Signs: There were no vitals taken for this visit.  Specialty Comments:  No specialty comments available.  PMFS History: Patient Active Problem List   Diagnosis Date Noted  . Achilles tendinitis of left lower extremity 12/07/2016  . Achilles tendinitis, left leg 05/17/2016  . Obstructive sleep apnea on CPAP 03/07/2015  . Post-operative state 03/07/2015  . Achilles rupture, left 10/21/2014  . OSA (  obstructive sleep apnea) 04/23/2013  . Bipolar disorder, unspecified (HCC) 01/07/2013  . Sinus tachycardia 01/07/2013  . Headache(784.0) 01/07/2013  . SDH (subdural hematoma) (HCC) 01/04/2013  . H/O PE 01/04/2013  . Chronic anticoagulation 01/04/2013  . 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  . Acute sinusitis 01/04/2013   Past Medical History:  Diagnosis Date  . Achilles rupture, left   . Bipolar 1 disorder (HCC)   . Dental caries    periodontitis  . Pneumonia   . Pulmonary embolism (HCC)   . Sleep apnea    wears CPAP  . Subdural hematoma (HCC) 2015    Family History  Problem Relation Age of Onset  . Hypertension Mother   . Stroke Mother   .  Multiple sclerosis Sister   . Down syndrome Son     Past Surgical History:  Procedure Laterality Date  . ACHILLES TENDON SURGERY Left 10/21/2014   Procedure: Left Achilles Reconstruction;  Surgeon: Nadara Mustard, MD;  Location[facility] OR;  Service: Orthopedics;  Laterality: Left;  . APPENDECTOMY    . CRANIOTOMY N/A 01/19/2013   Procedure: CRANIOTOMY HEMATOMA EVACUATION SUBDURAL;  Surgeon: Hewitt Shorts, MD;  Location: [facility];  Service: Neurosurgery;  Laterality: N/A;  . CYSTECTOMY     right head  . ELBOW SURGERY     right  . FRACTURE SURGERY     finger  . I&D EXTREMITY Left 12/07/2016   Procedure: LEFT ACHILLES DEBRIDEMENT;  Surgeon: Nadara Mustard, MD;  Location[facility];  Service: Orthopedics;  Laterality: Left;  Marland Kitchen MULTIPLE EXTRACTIONS WITH ALVEOLOPLASTY N/A 03/07/2015   Procedure: MULTIPLE EXTRACTION WITH ALVEOLOPLASTY;  Surgeon: [name], DDS;  Location: [facility] OR;  Service: Oral Surgery;  Laterality: N/A;   Social History   Occupational History  . Occupation: disability  Tobacco Use  . Smoking status: Never Smoker  . Smokeless tobacco: Never Used  Substance and Sexual Activity  . Alcohol use: No  . Drug use: No  . Sexual activity: Not on file

## 2017-03-23 ENCOUNTER — Emergency Department (HOSPITAL_COMMUNITY)
Admission: EM | Admit: 2017-03-23 | Discharge: 2017-03-23 | Disposition: A | Discharge status: Home / Self Care | Payer: Medicaid Other | Attending: Emergency Medicine | Admitting: Emergency Medicine

## 2017-03-23 ENCOUNTER — Other Ambulatory Visit: Payer: Self-pay

## 2017-03-23 DIAGNOSIS — M5441 Lumbago with sciatica, right side: Secondary | ICD-10-CM | POA: Insufficient documentation

## 2017-03-23 DIAGNOSIS — Z79899 Other long term (current) drug therapy: Secondary | ICD-10-CM | POA: Diagnosis not present

## 2017-03-23 DIAGNOSIS — M25551 Pain in right hip: Secondary | ICD-10-CM

## 2017-03-23 DIAGNOSIS — H5789 Other specified disorders of eye and adnexa: Secondary | ICD-10-CM | POA: Diagnosis not present

## 2017-03-23 MED ORDER — METHOCARBAMOL 500 MG PO TABS
500.0000 mg | ORAL_TABLET | Freq: Once | ORAL | Status: AC
  Administered 2017-03-23: 500 mg via ORAL
  Filled 2017-03-23: qty 1

## 2017-03-23 MED ORDER — DEXAMETHASONE SODIUM PHOSPHATE 10 MG/ML IJ SOLN
10.0000 mg | Freq: Once | INTRAMUSCULAR | Status: AC
  Administered 2017-03-23: 10 mg via INTRAMUSCULAR
  Filled 2017-03-23: qty 1

## 2017-03-23 MED ORDER — ACETAMINOPHEN 325 MG PO TABS
650.0000 mg | ORAL_TABLET | Freq: Once | ORAL | Status: AC
  Administered 2017-03-23: 650 mg via ORAL
  Filled 2017-03-23: qty 2

## 2017-03-23 MED ORDER — OLOPATADINE HCL 0.2 % OP SOLN: 1.0000 [drp] | Freq: Every day | OPHTHALMIC | 0 refills | Status: DC

## 2017-03-23 MED ORDER — PREDNISONE 20 MG PO TABS: 40.0000 mg | ORAL_TABLET | Freq: Every day | ORAL | 0 refills | Status: AC

## 2017-03-23 MED ORDER — METHOCARBAMOL 500 MG PO TABS: 500.0000 mg | ORAL_TABLET | Freq: Two times a day (BID) | ORAL | 0 refills | Status: DC

## 2017-03-23 MED ORDER — FLUORESCEIN SODIUM 1 MG OP STRP
1.0000 | ORAL_STRIP | Freq: Once | OPHTHALMIC | Status: AC
  Administered 2017-03-23: 1 via OPHTHALMIC
  Filled 2017-03-23: qty 1

## 2017-03-23 MED ORDER — KETOROLAC TROMETHAMINE 15 MG/ML IJ SOLN
15.0000 mg | Freq: Once | INTRAMUSCULAR | Status: AC
  Administered 2017-03-23: 15 mg via INTRAMUSCULAR
  Filled 2017-03-23: qty 1

## 2017-03-23 MED ORDER — TETRACAINE HCL 0.5 % OP SOLN
1.0000 [drp] | Freq: Once | OPHTHALMIC | Status: AC
  Administered 2017-03-23: 1 [drp] via OPHTHALMIC
  Filled 2017-03-23: qty 4

## 2017-03-23 NOTE — Discharge Instructions (Addendum)
Please see the information and instructions below regarding your visit.  Your diagnoses today include:  1. Right hip pain   2. Irritation of right eye   3. Redness of right eye   4. Acute right-sided low back pain with right-sided sciatica    About diagnosis. Most episodes of acute low back pain and hip pain are self-limited. Your exam was reassuring today that the source of your pain is not affecting the spinal cord and nerves that originate in the spinal cord.  You likely have inflammation of the sciatic nerve that radiates down the back of the right leg.  If you have a history of disc herniation or arthritis in your spine, the nerves exiting the spine on one side get inflamed. This can cause severe pain. We call this radiculopathy. We do not always know what causes the sudden inflammation.  Tests performed today include: See side panel of your discharge paperwork for testing performed today. Vital signs are listed at the bottom of these instructions.   Medications prescribed:    Take any prescribed medications only as prescribed, and any over the counter medications only as directed on the packaging.  You are prescribed prednisone, a steroid in the ED. This is a medication to help reduce inflammation in the spine.  Common side effects include upset stomach/nausea. You may take this medicine with food if this occurs. Other side effects include restlessness, difficulty sleeping, and increased sweating. Call your healthcare provider if these do not resolve after finishing the medication.  This medicine may increase your blood sugar so additional careful monitoring is needed of blood sugar if you have diabetes. Call your healthcare provider for any signs/symtpoms of high blood sugar such as confusion, feeling sleepy, more thirst, more hunger, passing urine more often, flushing, fast breathing, or breath that smells like fruit.  Home care instructions:   Low back pain gets worse the longer  you stay stationary. Please keep moving and walking as tolerated. There are exercises included in this packet to perform as tolerated for your low back pain.   Apply heat to the areas that are painful. Avoid twisting or bending your trunk to lift something. Do not lift anything above 25 lbs while recovering from this flare of low back pain.  Please follow any educational materials contained in this packet.   Follow-up instructions: Please follow-up with your primary care provider in as soon as possible for further evaluation of your symptoms if they are not completely improved.   Please follow up with Dr. Lajoyce Corners in orthopedics.  Return instructions:  Please return to the Emergency Department if you experience worsening symptoms.  Please return for any fever or chills in the setting of your back pain, weakness in the muscles of the legs, numbness in your legs and feet that is new or changing, numbness in the area where you wipe, retention of your urine, loss of bowel or bladder control, or problems with walking. Please return if you have any other emergent concerns.  Additional Information:   Your vital signs today were: BP 125/87 (BP Location: Left Arm)    Pulse 74    Temp 98.3 F (36.8 C) (Oral)    Resp 18    Ht 5\' 9"  (1.753 m)    Wt 109.3 kg (241 lb)    SpO2 95%    BMI 35.59 kg/m  If your blood pressure (BP) was elevated on multiple readings during this visit above 130 for the top number or above  80 for the bottom number, please have this repeated by your primary care provider within one month. --------------  Thank you for allowing [place] to participate in your care today.   The examination of your right eye appears to look like allergic reaction.  I have given you an eyedrop which she will placed in the eye once daily.  Please also apply warm compresses to the right eye.  If this is not improving, I would like you to follow-up with  your primary [facility] provider at [facility].  Please ensure that you are monitoring her symptoms for any drainage that is [place] or yellow out of your right eye, increasing redness of the eye, blurry vision, pain behind the eye, or pain with eye movements.

## 2017-03-23 NOTE — ED Triage Notes (Signed)
Pt reports pain and redness to the right eye.  Pt also reports his main complaint is a burning sensation on the back of his leg and it runs down to his calf. Pt reports he has to shift his weight to be able to sit comfortably. Pt reports he hasn't seen anyone about it.

## 2017-03-23 NOTE — ED Provider Notes (Signed)
[facility]-EMERGENCY DEPT Provider Note   CSN: [id number] Arrival date & time: 03/23/17  1422     History   Chief Complaint Chief Complaint  Patient presents with  . Eye Problem  . Hip Pain  . Leg Pain    HPI Justin [name] is a 52 y.o. male.  HPI  Patient is a 52 y.o. 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   Right hip pain: Right posterior hip pain for one week. Patient feels that it radiates down his right leg and he will occasionally feel toes of his right leg go numb.  Patient denies any history of back pain or degenerative disc disease of the spine that he is aware of.  Patient denies any recent injury to the back or hip.  Patient denies fever or chills, erythema or edema of the right hip.  Patient denies saddle anesthesia, loss of bowel or bladder control, IVDU, or history of cancer.  Patient reports that the pain originates in his hip and extends down to the right calf, however he does not have isolated calf pain.  No recent immobilization, surgery, hospitalization.  Right eye pain:  Patient reports that he has had some irritation of his right eye for 2 days.  Patient feels that he has a sensation of something under his superior eyelid.  Patient denies trauma to the eye.  Patient does report rubbing the eye.  Patient denies that there is watery drainage out of the eye in the mornings, but no purulence.  Patient denies any retro-orbital pain, blurry vision, double vision.  No sick contacts.  Past Medical History:  Diagnosis Date  . Achilles rupture, left   . Bipolar 1 disorder (HCC)   . Dental caries    periodontitis  . Pneumonia   . Pulmonary embolism (HCC)   . Sleep apnea    wears CPAP  . Subdural hematoma (HCC) 2015    Patient Active Problem List   Diagnosis Date Noted  . Achilles tendinitis of left lower extremity  12/07/2016  . Achilles tendinitis, left leg 05/17/2016  . Obstructive sleep apnea on CPAP 03/07/2015  . Post-operative state 03/07/2015  . Achilles rupture, left 10/21/2014  . OSA (obstructive sleep apnea) 04/23/2013  . Bipolar disorder, unspecified (HCC) 01/07/2013  . Sinus tachycardia 01/07/2013  . Headache(784.0) 01/07/2013  . SDH (subdural hematoma) (HCC) 01/04/2013  . H/O PE 01/04/2013  . Chronic anticoagulation 01/04/2013  . 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  . Acute sinusitis 01/04/2013    Past Surgical History:  Procedure Laterality Date  . ACHILLES TENDON SURGERY Left 10/21/2014   Procedure: Left Achilles Reconstruction;  Surgeon: Nadara Mustard, MD;  Location: [facility] OR;  Service: Orthopedics;  Laterality: Left;  . APPENDECTOMY    . CRANIOTOMY N/A 01/19/2013   Procedure: CRANIOTOMY HEMATOMA EVACUATION SUBDURAL;  Surgeon: Hewitt Shorts, MD;  Location: [facility];  Service: Neurosurgery;  Laterality: N/A;  . CYSTECTOMY     right head  . ELBOW SURGERY     right  . FRACTURE SURGERY     finger  . I&D EXTREMITY Left 12/07/2016   Procedure: LEFT ACHILLES DEBRIDEMENT;  Surgeon: Nadara Mustard, MD;  Location: [facility] OR;  Service: Orthopedics;  Laterality: Left;  Marland Kitchen MULTIPLE EXTRACTIONS WITH ALVEOLOPLASTY N/A 03/07/2015   Procedure: MULTIPLE EXTRACTION WITH ALVEOLOPLASTY;  Surgeon: [name], DDS;  Location: [facility] OR;  Service:  Oral Surgery;  Laterality: N/A;       Home Medications    Prior to Admission medications   Medication Sig Start Date End Date Taking? Authorizing Provider  cyclobenzaprine (FLEXERIL) 10 MG tablet Take 1 tablet (10 mg total) by mouth 2 (two) times daily as needed for muscle spasms. Patient not taking: Reported on 02/12/2017 06/23/16   Tegeler, Canary Brim, MD  lithium 600 MG capsule Take 600 mg by mouth at bedtime.    [provider]  meloxicam (MOBIC) 15 MG tablet Take 1 tablet (15 mg total) by mouth daily. TAKE WITH MEALS Patient  not taking: Reported on 02/12/2017 01/09/17   Street, Pena, PA-C  mirtazapine (REMERON) 45 MG tablet Take 45 mg by mouth at bedtime.    [provider]  nitroGLYCERIN (NITRODUR - DOSED IN MG/24 HR) 0.2 mg/hr patch Place 1 patch (0.2 mg total) onto the skin daily. Patient not taking: Reported on 02/12/2017 10/24/16   Adonis Huguenin, NP  oxyCODONE-acetaminophen (PERCOCET/ROXICET) 5-325 MG tablet Take 1 tablet by mouth every 4 (four) hours as needed for severe pain. Patient not taking: Reported on 02/12/2017 12/07/16   Nadara Mustard, MD  QUEtiapine (SEROQUEL XR) 400 MG 24 hr tablet Take 800 mg by mouth at bedtime.    [provider]    Family History Family History  Problem Relation Age of Onset  . Hypertension Mother   . Stroke Mother   . Multiple sclerosis Sister   . Down syndrome Son     Social History Social History   Tobacco Use  . Smoking status: Never Smoker  . Smokeless tobacco: Never Used  Substance Use Topics  . Alcohol use: No  . Drug use: No     Allergies   Patient has no known allergies.   Review of Systems Review of Systems  Constitutional: Negative for chills and fever.  Eyes: Positive for discharge, redness and itching.  Cardiovascular: Negative for leg swelling.  Genitourinary: Negative for difficulty urinating.  Musculoskeletal: Positive for arthralgias and myalgias. Negative for back pain, gait problem and joint swelling.  Skin: Negative for color change and rash.  Neurological: Negative for weakness and numbness.     Physical Exam Updated Vital Signs BP 125/87 (BP Location: Left Arm)   Pulse 74   Temp 98.3 F (36.8 C) (Oral)   Resp 18   Ht 5\' 9"  (1.753 m)   Wt 109.3 kg (241 lb)   SpO2 95%   BMI 35.59 kg/m   Physical Exam  Constitutional: He appears well-developed and well-nourished. No distress.  Sitting comfortably in bed.  HENT:  Head: Normocephalic and atraumatic.  Eyes: Conjunctivae are normal. Right eye exhibits  no discharge. Left eye exhibits no discharge.  Right Eye Exam: No periorbital edema or erythema. Diffuse scleral injection, particularly on superior surface of sclera. Conjunctival erythema w/o chemosis. No foreign bodies identified on lid eversion. PERRL. EOMI and no pain with extraocular movements. Slit lamp examination demonstrates no corneal ulceration, hyphema, or cell or flare. Wood's lamp examination demonstrates no punctate uptakes.    Neck: Normal range of motion.  Cardiovascular: Normal rate and regular rhythm.  Intact, 2+ and equal DP pulses bilaterally.  Pulmonary/Chest:  Normal respiratory effort. Patient converses comfortably. No audible wheeze or stridor.  Abdominal: He exhibits no distension.  Musculoskeletal: Normal range of motion.  Right hip exam performed with EMT chaperone present.  Right hip without erythema or edema.  There is tenderness to palpation surrounding the sciatic notch.  This extends down the right buttock and down the posterior right leg. Spine Exam: Inspection/Palpation: No tenderness to palpation of cervical, thoracic, or lumbar midline.  Patient does have some right paraspinal muscular tenderness extending around the right hip. Strength: 5/5 throughout LE bilaterally (hip flexion/extension, adduction/abduction; knee flexion/extension; foot dorsiflexion/plantarflexion, inversion/eversion; great toe inversion) Sensation: Intact to light touch in proximal and distal LE bilaterally Reflexes: 2+ quadriceps and achilles reflexes Patient ambulates without difficulty.  Patient able to perform heel walking and toe walking symmetrically.  Patient exhibits a slightly antalgic gait favoring the right.  Neurological: He is alert.  Cranial nerves intact to gross observation. Patient moves extremities without difficulty.  Skin: Skin is warm and dry. He is not diaphoretic.  Psychiatric: He has a normal mood and affect. His behavior is normal. Judgment and thought content  normal.  Nursing note and vitals reviewed.    ED Treatments / Results  Labs (all labs ordered are listed, but only abnormal results are displayed) Labs Reviewed - No data to display  EKG  EKG Interpretation None       Radiology No results found.  Procedures Procedures (including critical care time)  Medications Ordered in ED Medications  dexamethasone (DECADRON) injection 10 mg (10 mg Intramuscular Given 03/23/17 2014)  methocarbamol (ROBAXIN) tablet 500 mg (500 mg Oral Given 03/23/17 2014)  acetaminophen (TYLENOL) tablet 650 mg (650 mg Oral Given 03/23/17 2014)  tetracaine (PONTOCAINE) 0.5 % ophthalmic solution 1 drop (1 drop Right Eye Given 03/23/17 2014)  fluorescein ophthalmic strip 1 strip (1 strip Right Eye Given 03/23/17 2014)     Initial Impression / Assessment and Plan / ED Course  I have reviewed the triage vital signs and the nursing notes.  Pertinent labs & imaging results that were available during my care of the patient were reviewed by me and considered in my medical decision making (see chart for details).     Final Clinical Impressions(s) / ED Diagnoses   Final diagnoses:  Right hip pain  Irritation of right eye  Redness of right eye  Acute right-sided low back pain with right-sided sciatica   Patient is nontoxic-appearing, afebrile, and in no acute distress.  Patient's right hip pain is consistent with radicular right leg pain.  There are no high risk features for cauda equina.  Patient does have a history of DVT, however his pain is not originating in the calf.  Pain in the calf is only elicited with compression of the sciatic notch in the posterior hip.  Will treat this with steroids, muscle relaxants, as well as application of topical heat patches.  Recommended Tylenol pain was controlled in the emergency department with Toradol, Robaxin, and Decadron.  Return precautions given for any signs of cauda equina such as neurologic changes in the lower  extremities, fever or chills with symptoms, or midline back pain.  Patient to follow-up with his orthopedic physician.  Patient's right eye irritation is consistent with allergic conjunctivitis.  There is chemosis of the conjunctiva.  History and exam not consistent with inflammatory pathologies of the eye.  Will treat with olopatadine drops.  Discussed primary care follow-up with the patient.  Return precautions given for any worsening erythema, purulent drainage, retro-orbital pain, pain with extraocular movements.  Patient is in understanding and agrees with the plan of care.  This is a shared visit with Dr. Rolland Porter. Patient was independently evaluated by this attending physician. Attending physician consulted in evaluation and discharge management.  ED Discharge Orders  None       Delia Chimes 03/23/17 2345    Rolland Porter, MD 03/29/17 [phone]

## 2017-03-23 NOTE — ED Provider Notes (Signed)
Patient seen and evaluated. Discussed with PA. Has chemosis of his right eye at the medial canthus but no frank injection. Likely allergic conjunctivitis. We'll use: Histamines was undertaken and has been drops. Also has symptoms consistent with radicular pain in his right leg without neurological loss. Normal gait and reflexes. Plan anti-inflammatories muscle relaxant primary care follow-up.   Rolland Porter, MD 03/23/17 2141

## 2017-03-23 NOTE — ED Notes (Signed)
Bed: WLPT3 Expected date:  Expected time:  Means of arrival:  Comments: 

## 2017-03-23 NOTE — ED Notes (Signed)
Bed: WLPT1 Expected date:  Expected time:  Means of arrival:  Comments: 

## 2017-04-04 ENCOUNTER — Ambulatory Visit (INDEPENDENT_AMBULATORY_CARE_PROVIDER_SITE_OTHER): Payer: Medicaid Other

## 2017-04-04 ENCOUNTER — Ambulatory Visit (INDEPENDENT_AMBULATORY_CARE_PROVIDER_SITE_OTHER): Payer: Medicaid Other | Admitting: Orthopedic Surgery

## 2017-04-04 ENCOUNTER — Encounter (INDEPENDENT_AMBULATORY_CARE_PROVIDER_SITE_OTHER): Payer: Self-pay | Admitting: Orthopedic Surgery

## 2017-04-04 DIAGNOSIS — M4316 Spondylolisthesis, lumbar region: Secondary | ICD-10-CM | POA: Diagnosis not present

## 2017-04-04 DIAGNOSIS — M4317 Spondylolisthesis, lumbosacral region: Secondary | ICD-10-CM

## 2017-04-04 DIAGNOSIS — M5441 Lumbago with sciatica, right side: Secondary | ICD-10-CM

## 2017-04-04 MED ORDER — PREDNISONE 10 MG PO TABS: 20.0000 mg | ORAL_TABLET | Freq: Every day | ORAL | 0 refills | Status: DC

## 2017-04-04 NOTE — Progress Notes (Signed)
Office Visit Note   Patient: Justin [name]           Date of Birth: 08/10/1965           MRN: [id number] Visit Date: 04/04/2017              Requested by: [facility] [address] [place], [place] [address] PCP: [facility]  Chief Complaint  Patient presents with  . Lower Back - Pain      HPI: Patient is a 52 year old gentleman who complains of right-sided radicular pain from the lumbar spine radiating to the lateral aspect of the right calf.  Patient states he went to the emergency department last week he states he was given a few prednisone but this did not help he states he had a muscle relaxer which also did not help.  Patient complains of burning pain down the lateral aspect of his thigh and leg.  He states that the pain is worse with sitting.  Assessment & Plan: Visit Diagnoses:  1. Acute back pain with sciatica, right   2. Spondylolisthesis, lumbar region     Plan: Patient is given a prescription for prednisone to take 20 mg every morning.  We will set him up for an MRI scan to further evaluate the spondylolisthesis.  Patient may benefit from an epidural steroid injection.  Follow-Up Instructions: No Follow-up on file.   Ortho Exam  Patient is alert, oriented, no adenopathy, well-dressed, normal affect, normal respiratory effort. Examination patient sits leaning to the left side.  He has a negative straight leg raise he has good motor strength in the right lower extremity.  Radiographs shows a spondylolisthesis at 5 S1 with a filter in place for DVT.  Imaging: No results found. No images are attached to the encounter.  Labs: Lab Results  Component Value Date   REPTSTATUS 01/11/2013 FINAL 01/04/2013   CULT  01/04/2013    NO GROWTH 5 DAYS Performed at [facility]    @LABSALLVALUES (HGBA1)@  There is no height or weight on file to calculate BMI.  Orders:  Orders Placed This Encounter  Procedures  . XR Lumbar Spine 2-3 Views   Meds  ordered this encounter  Medications  . predniSONE (DELTASONE) 10 MG tablet    Sig: Take 2 tablets (20 mg total) by mouth daily with breakfast.    Dispense:  60 tablet    Refill:  0     Procedures: No procedures performed  Clinical Data: No additional findings.  ROS:  All other systems negative, except as noted in the HPI. Review of Systems  Objective: Vital Signs: There were no vitals taken for this visit.  Specialty Comments:  No specialty comments available.  PMFS History: Patient Active Problem List   Diagnosis Date Noted  . Achilles tendinitis of left lower extremity 12/07/2016  . Achilles tendinitis, left leg 05/17/2016  . Obstructive sleep apnea on CPAP 03/07/2015  . Post-operative state 03/07/2015  . Achilles rupture, left 10/21/2014  . OSA (obstructive sleep apnea) 04/23/2013  . Bipolar disorder, unspecified (HCC) 01/07/2013  . Sinus tachycardia 01/07/2013  . Headache(784.0) 01/07/2013  . SDH (subdural hematoma) (HCC) 01/04/2013  . H/O PE 01/04/2013  . Chronic anticoagulation 01/04/2013  . 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  . Acute sinusitis 01/04/2013   Past Medical History:  Diagnosis Date  . Achilles rupture, left   . Bipolar 1 disorder (HCC)   . Dental caries    periodontitis  . Pneumonia   .  Pulmonary embolism (HCC)   . Sleep apnea    wears CPAP  . Subdural hematoma (HCC) 2015    Family History  Problem Relation Age of Onset  . Hypertension Mother   . Stroke Mother   . Multiple sclerosis Sister   . Down syndrome Son     Past Surgical History:  Procedure Laterality Date  . ACHILLES TENDON SURGERY Left 10/21/2014   Procedure: Left Achilles Reconstruction;  Surgeon: Nadara Mustard, MD;  Location: [facility] OR;  Service: Orthopedics;  Laterality: Left;  . APPENDECTOMY    . CRANIOTOMY N/A 01/19/2013   Procedure: CRANIOTOMY HEMATOMA EVACUATION SUBDURAL;  Surgeon: Hewitt Shorts, MD;  Location: [facility];  Service: Neurosurgery;   Laterality: N/A;  . CYSTECTOMY     right head  . ELBOW SURGERY     right  . FRACTURE SURGERY     finger  . I&D EXTREMITY Left 12/07/2016   Procedure: LEFT ACHILLES DEBRIDEMENT;  Surgeon: Nadara Mustard, MD;  Location: [facility] OR;  Service: Orthopedics;  Laterality: Left;  Marland Kitchen MULTIPLE EXTRACTIONS WITH ALVEOLOPLASTY N/A 03/07/2015   Procedure: MULTIPLE EXTRACTION WITH ALVEOLOPLASTY;  Surgeon: [name], DDS;  Location: [facility] OR;  Service: Oral Surgery;  Laterality: N/A;   Social History   Occupational History  . Occupation: disability  Tobacco Use  . Smoking status: Never Smoker  . Smokeless tobacco: Never Used  Substance and Sexual Activity  . Alcohol use: No  . Drug use: No  . Sexual activity: Not on file

## 2017-04-12 ENCOUNTER — Telehealth (INDEPENDENT_AMBULATORY_CARE_PROVIDER_SITE_OTHER): Payer: Self-pay

## 2017-04-12 ENCOUNTER — Other Ambulatory Visit (INDEPENDENT_AMBULATORY_CARE_PROVIDER_SITE_OTHER): Payer: Self-pay

## 2017-04-12 DIAGNOSIS — M7662 Achilles tendinitis, left leg: Secondary | ICD-10-CM

## 2017-04-12 NOTE — Telephone Encounter (Signed)
Called pt to advise that the MRI has been denied. Per Dr. Lajoyce Corners will try physical therapy and follow up in the end of February. Referral made for [facility] physical therapy. Asked to contact pt direct to make appt. To call with questions.

## 2017-04-16 ENCOUNTER — Ambulatory Visit: Payer: Medicaid Other | Attending: Orthopedic Surgery | Admitting: Physical Therapy

## 2017-04-16 ENCOUNTER — Encounter: Payer: Self-pay | Admitting: Physical Therapy

## 2017-04-16 ENCOUNTER — Other Ambulatory Visit: Payer: Self-pay

## 2017-04-16 DIAGNOSIS — M6281 Muscle weakness (generalized): Secondary | ICD-10-CM | POA: Insufficient documentation

## 2017-04-16 DIAGNOSIS — M5441 Lumbago with sciatica, right side: Secondary | ICD-10-CM | POA: Diagnosis not present

## 2017-04-16 NOTE — Patient Instructions (Signed)
       Don't sit too long.  Change your position often.  Short walks are good.      Lifting Principles  .Maintain proper posture and head alignment. .Slide object as close as possible before lifting. .Move obstacles out of the way. .Test before lifting; ask for help if too heavy. .Tighten stomach muscles without holding breath. .Use smooth movements; do not jerk. .Use legs to do the work, and pivot with feet. .Distribute the work load symmetrically and close to the center of trunk. .Push instead of pull whenever possible.   Squat down and hold basket close to stand. Use leg muscles to do the work.    Avoid twisting or bending back. Pivot around using foot movements, and bend at knees if needed when reaching for articles.        Getting Into / Out of Bed   Lower self to lie down on one side by raising legs and lowering head at the same time. Use arms to assist moving without twisting. Bend both knees to roll onto back if desired. To sit up, start from lying on side, and use same move-ments in reverse. Keep trunk aligned with legs.    Shift weight from front foot to back foot as item is lifted off shelf.    When leaning forward to pick object up from floor, extend one leg out behind. Keep back straight. Hold onto a sturdy support with other hand.      Sit upright, head facing forward. Try using a roll to support lower back. Keep shoulders relaxed, and avoid rounded back. Keep hips level with knees. Avoid crossing legs for long periods.       Lavinia Sharps PT [facility] [address], [address] [place], [place] [address] Phone # [phone] Fax [phone]

## 2017-04-16 NOTE — Therapy (Signed)
[facility] [address], [address] [place], [place], [address] Phone: [phone]   Fax:  [phone]  Physical Therapy Evaluation  Patient Details  Name: Justin [name] MRN: [id number] Date of Birth: 10-30-1965 Referring Provider: Dr. Lajoyce Corners   Encounter Date: 04/16/2017  PT End of Session - 04/16/17 1113    Visit Number  1    Number of Visits  4    Date for PT Re-Evaluation  06/11/17    Authorization Type  Medicaid will submit for authorization    PT Start Time  1015    PT Stop Time  1110    PT Time Calculation (min)  55 min    Activity Tolerance  Patient tolerated treatment well       Past Medical History:  Diagnosis Date  . Achilles rupture, left   . Bipolar 1 disorder (HCC)   . Dental caries    periodontitis  . Pneumonia   . Pulmonary embolism (HCC)   . Sleep apnea    wears CPAP  . Subdural hematoma (HCC) 2015    Past Surgical History:  Procedure Laterality Date  . ACHILLES TENDON SURGERY Left 10/21/2014   Procedure: Left Achilles Reconstruction;  Surgeon: Nadara Mustard, MD;  Location: [facility] OR;  Service: Orthopedics;  Laterality: Left;  . APPENDECTOMY    . CRANIOTOMY N/A 01/19/2013   Procedure: CRANIOTOMY HEMATOMA EVACUATION SUBDURAL;  Surgeon: Hewitt Shorts, MD;  Location: [facility];  Service: Neurosurgery;  Laterality: N/A;  . CYSTECTOMY     right head  . ELBOW SURGERY     right  . FRACTURE SURGERY     finger  . I&D EXTREMITY Left 12/07/2016   Procedure: LEFT ACHILLES DEBRIDEMENT;  Surgeon: Nadara Mustard, MD;  Location: [facility] OR;  Service: Orthopedics;  Laterality: Left;  Marland Kitchen MULTIPLE EXTRACTIONS WITH ALVEOLOPLASTY N/A 03/07/2015   Procedure: MULTIPLE EXTRACTION WITH ALVEOLOPLASTY;  Surgeon: [name], DDS;  Location: [facility] OR;  Service: Oral Surgery;  Laterality: N/A;    There were no vitals filed for this visit.   Subjective Assessment - 04/16/17 1023    Subjective  Patient reports 1 month history of  right LBP with shooting pain to right hip, thigh and calf.  No known cause.      Pertinent History  MD 2/27    Limitations  Sitting;House hold activities    How long can you sit comfortably?  5 min     How long can you walk comfortably?  5 min     Diagnostic tests  MRI after PT    Patient Stated Goals  do this for Medicaid so they will pay for MRI      Currently in Pain?  Yes    Pain Score  8     Pain Location  Back    Pain Orientation  Right    Pain Type  Acute pain    Pain Onset  More than a month ago    Pain Frequency  Constant    Aggravating Factors   sitting upright;  walking    Pain Relieving Factors  sitting slouched         [facility] PT Assessment - 04/16/17 0001      Assessment   Medical Diagnosis  Low back pain     Referring Provider  Dr. Lajoyce Corners    Onset Date/Surgical Date  -- 1 month     Next MD Visit  05/15/17    Prior Therapy  no      Precautions   Precautions  None      Restrictions   Weight Bearing Restrictions  No      Balance Screen   Has the patient fallen in the past 6 months  No    Has the patient had a decrease in activity level because of a fear of falling?   No    Is the patient reluctant to leave their home because of a fear of falling?   No      Home Environment   Living Environment  Private residence    Living Arrangements  Alone    Type of Home  House    Home Layout  One level      Prior Function   Level of Independence  Independent    Vocation  On disability    Leisure  watch TV       Observation/Other Assessments   Focus on Therapeutic Outcomes (FOTO)   not done secondary to Medicaid with limited visits    Modified [place]  68% self perceived disability       Posture/Postural Control   Posture/Postural Control  Postural limitations    Postural Limitations  Decreased lumbar lordosis    Posture Comments  no shift;  no palpable step off       AROM   Overall AROM Comments  Extensive repeated movement testing not performed secondary to  pain severity but flexion in standing and supine increases pain, better with extension     AROM Assessment Site  -- patient reports no pain while lying prone or with press up    Lumbar Flexion  70 painful    Lumbar Extension  20    Lumbar - Right Side Bend  10 very painful    Lumbar - Left Side Bend  30      Strength   Lumbar Flexion  3/5 difficulty activating transverse abdominus muscles    Lumbar Extension  4/5      Flexibility   Soft Tissue Assessment /Muscle Length  yes    Hamstrings  60 degrees bil      Palpation   Palpation comment  tenderpoints in right quadratus lumborum       Slump test   Findings  Negative    Comment  negative bilaterally      Prone Knee Bend Test   Findings  Positive    Comment  mild discomfort in central low back       Straight Leg Raise   Findings  Negative    Side   Right             Objective measurements completed on examination: See above findings.      [facility] Adult PT Treatment/Exercise - 04/16/17 0001      Moist Heat Therapy   Number Minutes Moist Heat  10 Minutes    Moist Heat Location  Lumbar Spine patient prefers prone position             PT Education - 04/16/17 1058    Education provided  Yes    Education Details  prone press ups;  standing extensions; body mechanics    Person(s) Educated  Patient    Methods  Explanation;Demonstration;Handout    Comprehension  Verbalized understanding;Returned demonstration       PT Short Term Goals - 04/16/17 1206      PT SHORT TERM GOAL #1   Title  The patient will express understanding of basic self  care strategies for pain relief and promotion of healing    Time  4    Period  Weeks    Status  New    Target Date  05/14/17      PT SHORT TERM GOAL #2   Title  The patient will report a 30% reduction in back and peripheral symptoms    Time  4    Period  Weeks    Status  New        PT Long Term Goals - 04/16/17 1207      PT LONG TERM GOAL #1   Title  The  patient will be independent in safe self progression of HEP     Time  8    Period  Weeks    Status  New    Target Date  06/11/17      PT LONG TERM GOAL #2   Title  The patient will report a 60% reduction of back and right LE pain with sitting and other ADLs    Time  8    Period  Weeks    Status  New      PT LONG TERM GOAL #3   Title  The patient will be able to walk for 10-15 minutes needed for community ambulation    Time  8    Period  Weeks    Status  New      PT LONG TERM GOAL #4   Title  The patient will have full lumbar ROM with minimal pain needed for basic self care at home since he lives alone    Time  8    Period  Weeks    Status  New      PT LONG TERM GOAL #5   Title  Modified Oswestry score improved from 68% limitation to 56% indicating improved function with less pain    Time  8    Period  Weeks    Status  New             Plan - 04/16/17 1140    Clinical Impression Statement  The patient reports a 1 month history of right LBP with radiating symptoms to his right hip, thigh and posterior calf which is "severe" in intensity.  His PT order also states left achilles tendonitis but he reports this has fully resolved and he needs PT for his low back only.    He states he has just been staying at home watching TV.  He reports his pain is worsened with sitting and walking for prolonged periods of time.   His lumbar ROM is limited primarily with right sidebending and  painful.  He reports centralization and relief of pain with prone lying and extension movements.   Discussed stoplight system to avoid peripheralization and exacerbation of symptoms.  Lumbar flexion:  60 degrees, extension 20 degrees, right sidebending 10 degrees, left sidebending 30 degrees.  Decreased activation of transverse abdominus muscles 3/5.    Decreased lumbar lordosis.  Tender points in right quadratus lumborum.  Decreased HS lengths but negative neural signs.  He would benefit from PT to address  these deficits.      History and Personal Factors relevant to plan of care:  numerous co-morbidities including bipolar, sleep apnea, hx of subdural hematoma; hx of pulmonary embolus;  on disability     Clinical Presentation  Evolving    Clinical Presentation due to:  worsening of symptoms and peripheralization of pain  Clinical Decision Making  Moderate    Rehab Potential  Good    Clinical Impairments Affecting Rehab Potential  psychiatric history and numerous co-morbidities    PT Frequency  2x / week    PT Duration  8 weeks    PT Treatment/Interventions  ADLs/Self Care Home Management;Cryotherapy;Electrical Stimulation;Ultrasound;Traction;Therapeutic activities;Therapeutic exercise;Neuromuscular re-education;Patient/family education;Manual techniques;Taping;Dry needling    PT Next Visit Plan  asssess response to press up and centralization;  modalities for pain control;  patient education         Patient will benefit from skilled therapeutic intervention in order to improve the following deficits and impairments:  Pain, Decreased range of motion, Decreased strength, Difficulty walking, Postural dysfunction, Decreased activity tolerance  Visit Diagnosis: Acute right-sided low back pain with right-sided sciatica - Plan: PT plan of care cert/re-cert  Muscle weakness (generalized) - Plan: PT plan of care cert/re-cert     Problem List Patient Active Problem List   Diagnosis Date Noted  . Achilles tendinitis of left lower extremity 12/07/2016  . Achilles tendinitis, left leg 05/17/2016  . Obstructive sleep apnea on CPAP 03/07/2015  . Post-operative state 03/07/2015  . Achilles rupture, left 10/21/2014  . OSA (obstructive sleep apnea) 04/23/2013  . Bipolar disorder, unspecified (HCC) 01/07/2013  . Sinus tachycardia 01/07/2013  . Headache(784.0) 01/07/2013  . SDH (subdural hematoma) (HCC) 01/04/2013  . H/O PE 01/04/2013  . Chronic anticoagulation 01/04/2013  . Warfarin-induced  coagulopathy (HCC) 01/04/2013  . Acute sinusitis 01/04/2013   Lavinia Sharps, PT 04/16/17 12:19 PM Phone: [phone] Fax: [phone]  Vivien Presto 04/16/2017, 12:18 PM  [facility] [address], [address] [place], [place], [address] Phone: [phone]   Fax:  [phone]  Name: Justin [name] MRN: [id number] Date of Birth: [birth year]

## 2017-04-25 ENCOUNTER — Ambulatory Visit: Payer: Medicaid Other | Admitting: Physical Therapy

## 2017-04-25 ENCOUNTER — Ambulatory Visit (INDEPENDENT_AMBULATORY_CARE_PROVIDER_SITE_OTHER): Payer: Medicaid Other | Admitting: Orthopedic Surgery

## 2017-04-26 ENCOUNTER — Other Ambulatory Visit (INDEPENDENT_AMBULATORY_CARE_PROVIDER_SITE_OTHER): Payer: Self-pay

## 2017-04-26 ENCOUNTER — Telehealth (INDEPENDENT_AMBULATORY_CARE_PROVIDER_SITE_OTHER): Payer: Self-pay | Admitting: Orthopedic Surgery

## 2017-04-26 MED ORDER — METHOCARBAMOL 500 MG PO TABS: 500.0000 mg | ORAL_TABLET | Freq: Two times a day (BID) | ORAL | 0 refills | Status: DC

## 2017-04-26 NOTE — Telephone Encounter (Signed)
Patient requesting an RX for a muscle relaxer be sent in to [facility] on [address]. Please advise # [phone]

## 2017-04-26 NOTE — Telephone Encounter (Signed)
Ok per erin called into pharm.

## 2017-05-02 ENCOUNTER — Ambulatory Visit: Payer: Medicaid Other | Attending: Orthopedic Surgery | Admitting: Physical Therapy

## 2017-05-02 ENCOUNTER — Encounter: Payer: Self-pay | Admitting: Physical Therapy

## 2017-05-02 DIAGNOSIS — M6281 Muscle weakness (generalized): Secondary | ICD-10-CM | POA: Insufficient documentation

## 2017-05-02 DIAGNOSIS — M5441 Lumbago with sciatica, right side: Secondary | ICD-10-CM | POA: Insufficient documentation

## 2017-05-02 NOTE — Therapy (Signed)
[facility] [address] [place], [place], [address] Phone: [phone]   Fax:  [phone]  Physical Therapy Treatment  Patient Details  Name: Justin [name] MRN: [id number] Date of Birth: 01-02-1966 Referring Provider: Dr. Lajoyce Corners   Encounter Date: 05/02/2017  PT End of Session - 05/02/17 0913    Visit Number  2    Number of Visits  4    Date for PT Re-Evaluation  06/11/17    Authorization Type  Medicaid 3 visits 2/4-3/3    PT Start Time  0844    PT Stop Time  0934    PT Time Calculation (min)  50 min    Activity Tolerance  Patient tolerated treatment well       Past Medical History:  Diagnosis Date  . Achilles rupture, left   . Bipolar 1 disorder (HCC)   . Dental caries    periodontitis  . Pneumonia   . Pulmonary embolism (HCC)   . Sleep apnea    wears CPAP  . Subdural hematoma (HCC) 2015    Past Surgical History:  Procedure Laterality Date  . ACHILLES TENDON SURGERY Left 10/21/2014   Procedure: Left Achilles Reconstruction;  Surgeon: Nadara Mustard, MD;  Location: [facility] OR;  Service: Orthopedics;  Laterality: Left;  . APPENDECTOMY    . CRANIOTOMY N/A 01/19/2013   Procedure: CRANIOTOMY HEMATOMA EVACUATION SUBDURAL;  Surgeon: Hewitt Shorts, MD;  Location: [facility];  Service: Neurosurgery;  Laterality: N/A;  . CYSTECTOMY     right head  . ELBOW SURGERY     right  . FRACTURE SURGERY     finger  . I&D EXTREMITY Left 12/07/2016   Procedure: LEFT ACHILLES DEBRIDEMENT;  Surgeon: Nadara Mustard, MD;  Location: [facility] OR;  Service: Orthopedics;  Laterality: Left;  Marland Kitchen MULTIPLE EXTRACTIONS WITH ALVEOLOPLASTY N/A 03/07/2015   Procedure: MULTIPLE EXTRACTION WITH ALVEOLOPLASTY;  Surgeon: [name], DDS;  Location: [facility] OR;  Service: Oral Surgery;  Laterality: N/A;    There were no vitals filed for this visit.  Subjective Assessment - 05/02/17 0841    Subjective  My cousin passed away this week.  I walked here  this morning.  Still going all the way down my leg.  I get my son today, he has Down Syndrome.  States he did press ups and it loosed it up but it didn't stay that way.      Currently in Pain?  Yes    Pain Score  8     Pain Location  Back    Pain Orientation  Right    Pain Type  Acute pain    Aggravating Factors   sitting upright; walking    Pain Relieving Factors  sitting leaning left                      [facility] Adult PT Treatment/Exercise - 05/02/17 0001      Lumbar Exercises: Standing   Other Standing Lumbar Exercises  lateral glides on wall right       Lumbar Exercises: Sidelying   Other Sidelying Lumbar Exercises  sidelying over pillow      Lumbar Exercises: Prone   Other Prone Lumbar Exercises  press ups in roadkill       Electrical Stimulation   Electrical Stimulation Location  right lumbar    Electrical Stimulation Action  IFC    Electrical Stimulation Parameters  10 ma 15 min    Electrical  Stimulation Goals  Pain      Traction   Min (lbs)  50    Max (lbs)  90    Hold Time  15    Rest Time  15    Time  15             PT Education - 05/02/17 0913    Education provided  Yes    Education Details  lateral compartment techniques sidelying, standing, roadkill press up    [employer]) Educated  Patient    Methods  Explanation;Demonstration;Handout    Comprehension  Verbalized understanding;Returned demonstration       PT Short Term Goals - 04/16/17 1206      PT SHORT TERM GOAL #1   Title  The patient will express understanding of basic self care strategies for pain relief and promotion of healing    Time  4    Period  Weeks    Status  New    Target Date  05/14/17      PT SHORT TERM GOAL #2   Title  The patient will report a 30% reduction in back and peripheral symptoms    Time  4    Period  Weeks    Status  New        PT Long Term Goals - 04/16/17 1207      PT LONG TERM GOAL #1   Title  The patient will be independent in safe self  progression of HEP     Time  8    Period  Weeks    Status  New    Target Date  06/11/17      PT LONG TERM GOAL #2   Title  The patient will report a 60% reduction of back and right LE pain with sitting and other ADLs    Time  8    Period  Weeks    Status  New      PT LONG TERM GOAL #3   Title  The patient will be able to walk for 10-15 minutes needed for community ambulation    Time  8    Period  Weeks    Status  New      PT LONG TERM GOAL #4   Title  The patient will have full lumbar ROM with minimal pain needed for basic self care at home since he lives alone    Time  8    Period  Weeks    Status  New      PT LONG TERM GOAL #5   Title  Modified Oswestry score improved from 68% limitation to 56% indicating improved function with less pain    Time  8    Period  Weeks    Status  New            Plan - 05/02/17 1711    Clinical Impression Statement  The patient reports good low back and LE pain relief with mechanical traction as well as ES and heat.  Added lateral compartment techniques to HEP since unable to achieve centralization with saggital plane ex.  Therapist closely monitoring response to all interventions.        Rehab Potential  Good    PT Frequency  2x / week    PT Duration  8 weeks    PT Treatment/Interventions  ADLs/Self Care Home Management;Cryotherapy;Electrical Stimulation;Ultrasound;Traction;Therapeutic activities;Therapeutic exercise;Neuromuscular re-education;Patient/family education;Manual techniques;Taping;Dry needling    PT Next Visit Plan  asssess response to lateral component  ex's and centralization; assess response to traction;   modalities for pain control;  patient education         Patient will benefit from skilled therapeutic intervention in order to improve the following deficits and impairments:  Pain, Decreased range of motion, Decreased strength, Difficulty walking, Postural dysfunction, Decreased activity tolerance  Visit  Diagnosis: Acute right-sided low back pain with right-sided sciatica  Muscle weakness (generalized)     Problem List Patient Active Problem List   Diagnosis Date Noted  . Achilles tendinitis of left lower extremity 12/07/2016  . Achilles tendinitis, left leg 05/17/2016  . Obstructive sleep apnea on CPAP 03/07/2015  . Post-operative state 03/07/2015  . Achilles rupture, left 10/21/2014  . OSA (obstructive sleep apnea) 04/23/2013  . Bipolar disorder, unspecified (HCC) 01/07/2013  . Sinus tachycardia 01/07/2013  . Headache(784.0) 01/07/2013  . SDH (subdural hematoma) (HCC) 01/04/2013  . H/O PE 01/04/2013  . Chronic anticoagulation 01/04/2013  . 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  . Acute sinusitis 01/04/2013   Lavinia Sharps, PT 05/02/17 5:17 PM Phone: [phone] Fax: [phone]  Vivien Presto 05/02/2017, 5:17 PM  [facility] [address] [place], [place], [address] Phone: [phone]   Fax:  [phone]  Name: Justin [name] MRN: [id number] Date of Birth: 1966-01-25

## 2017-05-02 NOTE — Patient Instructions (Signed)
Justin [name] PT [facility] [address], [address] [place], [place] [address] Phone # [phone] Fax [phone]    

## 2017-05-09 ENCOUNTER — Encounter: Payer: Self-pay | Admitting: Physical Therapy

## 2017-05-09 ENCOUNTER — Ambulatory Visit: Payer: Medicaid Other | Admitting: Physical Therapy

## 2017-05-09 DIAGNOSIS — M5441 Lumbago with sciatica, right side: Secondary | ICD-10-CM | POA: Diagnosis not present

## 2017-05-09 DIAGNOSIS — M6281 Muscle weakness (generalized): Secondary | ICD-10-CM

## 2017-05-09 NOTE — Therapy (Signed)
[facility] [address] [place], [place], [address] Phone: [phone]   Fax:  [phone]  Physical Therapy Treatment  Patient Details  Name: Justin [name] MRN: [id number] Date of Birth: 1966-02-12 Referring Provider: Dr. Lajoyce Corners   Encounter Date: 05/09/2017  PT End of Session - 05/09/17 0948    Visit Number  3    Number of Visits  4    Date for PT Re-Evaluation  06/11/17    Authorization Type  Medicaid 3 visits 2/4-3/3    PT Start Time  0928    PT Stop Time  1013    PT Time Calculation (min)  45 min    Activity Tolerance  Patient tolerated treatment well       Past Medical History:  Diagnosis Date  . Achilles rupture, left   . Bipolar 1 disorder (HCC)   . Dental caries    periodontitis  . Pneumonia   . Pulmonary embolism (HCC)   . Sleep apnea    wears CPAP  . Subdural hematoma (HCC) 2015    Past Surgical History:  Procedure Laterality Date  . ACHILLES TENDON SURGERY Left 10/21/2014   Procedure: Left Achilles Reconstruction;  Surgeon: Nadara Mustard, MD;  Location: [facility] OR;  Service: Orthopedics;  Laterality: Left;  . APPENDECTOMY    . CRANIOTOMY N/A 01/19/2013   Procedure: CRANIOTOMY HEMATOMA EVACUATION SUBDURAL;  Surgeon: Hewitt Shorts, MD;  Location: [facility];  Service: Neurosurgery;  Laterality: N/A;  . CYSTECTOMY     right head  . ELBOW SURGERY     right  . FRACTURE SURGERY     finger  . I&D EXTREMITY Left 12/07/2016   Procedure: LEFT ACHILLES DEBRIDEMENT;  Surgeon: Nadara Mustard, MD;  Location: [facility] OR;  Service: Orthopedics;  Laterality: Left;  Marland Kitchen MULTIPLE EXTRACTIONS WITH ALVEOLOPLASTY N/A 03/07/2015   Procedure: MULTIPLE EXTRACTION WITH ALVEOLOPLASTY;  Surgeon: [name], DDS;  Location: [facility] OR;  Service: Oral Surgery;  Laterality: N/A;    There were no vitals filed for this visit.  Subjective Assessment - 05/09/17 0929    Subjective  My back locks up.  Sits leaning to the left.  The  traction and everything else helps for a little while then it goes back.  Intermittent right thigh symptoms.  9/10 right low back pain and it's been hurting like that for 2 days.  I've had to catch the bus a lot the last 2 days and walk a lot.      Pertinent History  MD 2/27    Currently in Pain?  Yes    Pain Score  9     Pain Location  Back    Pain Orientation  Right;Lower    Pain Type  Acute pain    Pain Radiating Towards  intermittent right LE symptoms    Pain Frequency  Constant    Aggravating Factors   walking; sitting upright    Pain Relieving Factors  lean to the left; relax my leg                      [facility] Adult PT Treatment/Exercise - 05/09/17 0001      Self-Care   Self-Care  Other Self-Care Comments    Other Self-Care Comments   discussion of centralization and peripheralizaton and looking for patterns with LE symptoms.  Encouraged continued compliance with prone press ups every 2 hours      Moist Heat Therapy  Number Minutes Moist Heat  15 Minutes    Moist Heat Location  Lumbar Spine      Electrical Stimulation   Electrical Stimulation Location  right lumbar    Electrical Stimulation Action  IFC    Electrical Stimulation Parameters  10 ma 15 min    Electrical Stimulation Goals  Pain      Traction   Min (lbs)  50    Max (lbs)  100    Hold Time  15    Rest Time  15    Time  15               PT Short Term Goals - 04/16/17 1206      PT SHORT TERM GOAL #1   Title  The patient will express understanding of basic self care strategies for pain relief and promotion of healing    Time  4    Period  Weeks    Status  New    Target Date  05/14/17      PT SHORT TERM GOAL #2   Title  The patient will report a 30% reduction in back and peripheral symptoms    Time  4    Period  Weeks    Status  New        PT Long Term Goals - 04/16/17 1207      PT LONG TERM GOAL #1   Title  The patient will be independent in safe self progression of HEP      Time  8    Period  Weeks    Status  New    Target Date  06/11/17      PT LONG TERM GOAL #2   Title  The patient will report a 60% reduction of back and right LE pain with sitting and other ADLs    Time  8    Period  Weeks    Status  New      PT LONG TERM GOAL #3   Title  The patient will be able to walk for 10-15 minutes needed for community ambulation    Time  8    Period  Weeks    Status  New      PT LONG TERM GOAL #4   Title  The patient will have full lumbar ROM with minimal pain needed for basic self care at home since he lives alone    Time  8    Period  Weeks    Status  New      PT LONG TERM GOAL #5   Title  Modified Oswestry score improved from 68% limitation to 56% indicating improved function with less pain    Time  8    Period  Weeks    Status  New            Plan - 05/09/17 4098    Clinical Impression Statement  The patient continues to be in "severe" constant low back pain with intermittent right calf pain.   He has good pain relief with mechanical traction and ES/Heat although he reports the relief is just temporary.  He states last time it was only 1 1/2 hours relief.  No lateral shift in standing but tends to sit leaning away from the side of the pain.  Encouraged continued compliance with HEP and centralization phenomenon.  He sees the doctor next week.      Rehab Potential  Good    Clinical Impairments Affecting Rehab  Potential  psychiatric history and numerous co-morbidities    PT Frequency  2x / week    PT Duration  8 weeks    PT Treatment/Interventions  ADLs/Self Care Home Management;Cryotherapy;Electrical Stimulation;Ultrasound;Traction;Therapeutic activities;Therapeutic exercise;Neuromuscular re-education;Patient/family education;Manual techniques;Taping;Dry needling    PT Next Visit Plan  see how MD went;  mechanical traction; ES/heat;  centralization ex;  see how MD appt went;  resubmit to Medicaid next visit;  check STGs       Patient will  benefit from skilled therapeutic intervention in order to improve the following deficits and impairments:  Pain, Decreased range of motion, Decreased strength, Difficulty walking, Postural dysfunction, Decreased activity tolerance  Visit Diagnosis: Acute right-sided low back pain with right-sided sciatica  Muscle weakness (generalized)     Problem List Patient Active Problem List   Diagnosis Date Noted  . Achilles tendinitis of left lower extremity 12/07/2016  . Achilles tendinitis, left leg 05/17/2016  . Obstructive sleep apnea on CPAP 03/07/2015  . Post-operative state 03/07/2015  . Achilles rupture, left 10/21/2014  . OSA (obstructive sleep apnea) 04/23/2013  . Bipolar disorder, unspecified (HCC) 01/07/2013  . Sinus tachycardia 01/07/2013  . Headache(784.0) 01/07/2013  . SDH (subdural hematoma) (HCC) 01/04/2013  . H/O PE 01/04/2013  . Chronic anticoagulation 01/04/2013  . 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  . Acute sinusitis 01/04/2013   Lavinia Sharps, PT 05/09/17 12:43 PM Phone: [phone] Fax: [phone]  Vivien Presto 05/09/2017, 12:43 PM  [facility] [address] [place], [place], [address] Phone: [phone]   Fax:  [phone]  Name: Justin [name] MRN: [id number] Date of Birth: Jan 21, 1966

## 2017-05-13 ENCOUNTER — Ambulatory Visit (INDEPENDENT_AMBULATORY_CARE_PROVIDER_SITE_OTHER): Payer: Medicaid Other | Admitting: Orthopedic Surgery

## 2017-05-14 ENCOUNTER — Encounter (INDEPENDENT_AMBULATORY_CARE_PROVIDER_SITE_OTHER): Payer: Self-pay | Admitting: Orthopedic Surgery

## 2017-05-14 ENCOUNTER — Ambulatory Visit (INDEPENDENT_AMBULATORY_CARE_PROVIDER_SITE_OTHER): Payer: Medicaid Other | Admitting: Orthopedic Surgery

## 2017-05-14 ENCOUNTER — Telehealth: Payer: Self-pay | Admitting: Physical Therapy

## 2017-05-14 ENCOUNTER — Ambulatory Visit: Payer: Medicaid Other | Admitting: Physical Therapy

## 2017-05-14 DIAGNOSIS — M5441 Lumbago with sciatica, right side: Secondary | ICD-10-CM | POA: Diagnosis not present

## 2017-05-14 NOTE — Progress Notes (Signed)
Office Visit Note   Patient: Justin [name]           Date of Birth: March 13, 1966           MRN: [id number] Visit Date: 05/14/2017              Requested by: [facility] [address] [place], [place] [address] PCP: [facility]  No chief complaint on file.     HPI: Patient is a 52 year old gentleman who presents in follow-up for his lumbar spine with right-sided radicular pain that radiates down the posterior aspect of the right calf.  Patient states that he has had interval improvement with therapy he states he feels good after therapy but sometimes after having to walk home or walk to his appointment that is up or down a hill he states he has increased pain and then has to lay around for the rest of the day.  Patient has completed 3 weeks of physical therapy.  Assessment & Plan: Visit Diagnoses:  1. Acute back pain with sciatica, right     Plan: Recommended continuing physical therapy for 3 additional weeks a prescription was provided.  Follow-up in 4 weeks.  If patient is still symptomatic would request an MRI scan of the lumbar spine.  Follow-Up Instructions: Return in about 4 weeks (around 06/11/2017).   Ortho Exam  Patient is alert, oriented, no adenopathy, well-dressed, normal affect, normal respiratory effort. Examination patient has a normal gait.  Is a negative straight leg raise bilaterally.  He has good plantar flexion dorsiflexion strength which is symmetric bilaterally.  Hip flexion is weaker on the right compared to the left.  Imaging: No results found. No images are attached to the encounter.  Labs: Lab Results  Component Value Date   REPTSTATUS 01/11/2013 FINAL 01/04/2013   CULT  01/04/2013    NO GROWTH 5 DAYS Performed at [facility]    @LABSALLVALUES (HGBA1)@  There is no height or weight on file to calculate BMI.  Orders:  No orders of the defined types were placed in this encounter.  No orders of the defined types were  placed in this encounter.    Procedures: No procedures performed  Clinical Data: No additional findings.  ROS:  All other systems negative, except as noted in the HPI. Review of Systems  Objective: Vital Signs: There were no vitals taken for this visit.  Specialty Comments:  No specialty comments available.  PMFS History: Patient Active Problem List   Diagnosis Date Noted  . Achilles tendinitis of left lower extremity 12/07/2016  . Achilles tendinitis, left leg 05/17/2016  . Obstructive sleep apnea on CPAP 03/07/2015  . Post-operative state 03/07/2015  . Achilles rupture, left 10/21/2014  . OSA (obstructive sleep apnea) 04/23/2013  . Bipolar disorder, unspecified (HCC) 01/07/2013  . Sinus tachycardia 01/07/2013  . Headache(784.0) 01/07/2013  . SDH (subdural hematoma) (HCC) 01/04/2013  . H/O PE 01/04/2013  . Chronic anticoagulation 01/04/2013  . 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  . Acute sinusitis 01/04/2013   Past Medical History:  Diagnosis Date  . Achilles rupture, left   . Bipolar 1 disorder (HCC)   . Dental caries    periodontitis  . Pneumonia   . Pulmonary embolism (HCC)   . Sleep apnea    wears CPAP  . Subdural hematoma (HCC) 2015    Family History  Problem Relation Age of Onset  . Hypertension Mother   . Stroke Mother   . Multiple sclerosis Sister   .  Down syndrome Son     Past Surgical History:  Procedure Laterality Date  . ACHILLES TENDON SURGERY Left 10/21/2014   Procedure: Left Achilles Reconstruction;  Surgeon: Nadara Mustard, MD;  Location: [facility] OR;  Service: Orthopedics;  Laterality: Left;  . APPENDECTOMY    . CRANIOTOMY N/A 01/19/2013   Procedure: CRANIOTOMY HEMATOMA EVACUATION SUBDURAL;  Surgeon: Hewitt Shorts, MD;  Location: [facility];  Service: Neurosurgery;  Laterality: N/A;  . CYSTECTOMY     right head  . ELBOW SURGERY     right  . FRACTURE SURGERY     finger  . I&D EXTREMITY Left 12/07/2016   Procedure: LEFT  ACHILLES DEBRIDEMENT;  Surgeon: Nadara Mustard, MD;  Location: [facility] OR;  Service: Orthopedics;  Laterality: Left;  Marland Kitchen MULTIPLE EXTRACTIONS WITH ALVEOLOPLASTY N/A 03/07/2015   Procedure: MULTIPLE EXTRACTION WITH ALVEOLOPLASTY;  Surgeon: [name], DDS;  Location: [facility] OR;  Service: Oral Surgery;  Laterality: N/A;   Social History   Occupational History  . Occupation: disability  Tobacco Use  . Smoking status: Never Smoker  . Smokeless tobacco: Never Used  Substance and Sexual Activity  . Alcohol use: No  . Drug use: No  . Sexual activity: Not on file

## 2017-05-14 NOTE — Telephone Encounter (Signed)
Spoke with patient after no-show to appt this morning.  States he woke up late and is going to see the doctor later this morning.  He will call back after he sees the doctor.

## 2017-05-20 ENCOUNTER — Telehealth (INDEPENDENT_AMBULATORY_CARE_PROVIDER_SITE_OTHER): Payer: Self-pay | Admitting: Orthopedic Surgery

## 2017-05-20 NOTE — Telephone Encounter (Signed)
Pt is requesting rx for muscle relaxer. He is doing physical therapy for L spine pain must complete 6 weeks total to have MRI approved. Do you want to give rx?

## 2017-05-20 NOTE — Telephone Encounter (Signed)
Patient called stating that he can barely walk and he was wondering if he could be prescribed some muscle relaxers. CB # [phone]

## 2017-05-20 NOTE — Telephone Encounter (Signed)
Call in robaxin, 500 mg TID PRN spasm, #30

## 2017-05-21 ENCOUNTER — Other Ambulatory Visit (INDEPENDENT_AMBULATORY_CARE_PROVIDER_SITE_OTHER): Payer: Self-pay

## 2017-05-21 MED ORDER — METHOCARBAMOL 500 MG PO TABS: 500.0000 mg | ORAL_TABLET | Freq: Three times a day (TID) | ORAL | 0 refills | Status: DC

## 2017-05-21 NOTE — Progress Notes (Signed)
robaxin 

## 2017-05-21 NOTE — Telephone Encounter (Signed)
Faxed to requested pharmacy

## 2017-05-21 NOTE — Telephone Encounter (Signed)
This is done.

## 2017-05-21 NOTE — Telephone Encounter (Signed)
Patient called back asking for it to be sent to the [facility] on [address] instead, please.

## 2017-05-30 ENCOUNTER — Ambulatory Visit: Payer: Medicaid Other | Attending: Orthopedic Surgery | Admitting: Physical Therapy

## 2017-05-30 ENCOUNTER — Encounter: Payer: Self-pay | Admitting: Physical Therapy

## 2017-05-30 DIAGNOSIS — M5441 Lumbago with sciatica, right side: Secondary | ICD-10-CM | POA: Diagnosis present

## 2017-05-30 DIAGNOSIS — M6281 Muscle weakness (generalized): Secondary | ICD-10-CM | POA: Insufficient documentation

## 2017-05-30 NOTE — Patient Instructions (Signed)
        Add a twin sheet or towel for overpressure to low back with press ups   Lavinia Sharps PT [facility] [address], [address] [place], [place] [address] Phone # [phone] Fax [phone]

## 2017-05-30 NOTE — Therapy (Signed)
[facility] [address] [place], [place], [address] Phone: [phone]   Fax:  [phone]  Physical Therapy Treatment  Patient Details  Name: Justin [name] MRN: [id number] Date of Birth: 1965/05/02 Referring Provider: Dr. Lajoyce Corners   Encounter Date: 05/30/2017  PT End of Session - 05/30/17 1100    Visit Number  4    Number of Visits  15    Date for PT Re-Evaluation  06/11/17    Authorization Type  Medicaid approved 12 visits from 3/12-4/22    PT Start Time  1013    PT Stop Time  1108    PT Time Calculation (min)  55 min    Activity Tolerance  Patient tolerated treatment well       Past Medical History:  Diagnosis Date  . Achilles rupture, left   . Bipolar 1 disorder (HCC)   . Dental caries    periodontitis  . Pneumonia   . Pulmonary embolism (HCC)   . Sleep apnea    wears CPAP  . Subdural hematoma (HCC) 2015    Past Surgical History:  Procedure Laterality Date  . ACHILLES TENDON SURGERY Left 10/21/2014   Procedure: Left Achilles Reconstruction;  Surgeon: Nadara Mustard, MD;  Location: [facility] OR;  Service: Orthopedics;  Laterality: Left;  . APPENDECTOMY    . CRANIOTOMY N/A 01/19/2013   Procedure: CRANIOTOMY HEMATOMA EVACUATION SUBDURAL;  Surgeon: Hewitt Shorts, MD;  Location: [facility];  Service: Neurosurgery;  Laterality: N/A;  . CYSTECTOMY     right head  . ELBOW SURGERY     right  . FRACTURE SURGERY     finger  . I&D EXTREMITY Left 12/07/2016   Procedure: LEFT ACHILLES DEBRIDEMENT;  Surgeon: Nadara Mustard, MD;  Location: [facility] OR;  Service: Orthopedics;  Laterality: Left;  Marland Kitchen MULTIPLE EXTRACTIONS WITH ALVEOLOPLASTY N/A 03/07/2015   Procedure: MULTIPLE EXTRACTION WITH ALVEOLOPLASTY;  Surgeon: [name], DDS;  Location: [facility] OR;  Service: Oral Surgery;  Laterality: N/A;    There were no vitals filed for this visit.  Subjective Assessment - 05/30/17 1012    Subjective  Still in pain.  I had to stop 2x  walking up the hill today.  Right calf pain with prolonged walking.  I haven't been going out much.  I've been doing the press ups.      Pertinent History  end of March/early April MD    Currently in Pain?  Yes    Pain Score  8     Pain Location  Back    Pain Orientation  Right    Pain Radiating Towards  right calf    Aggravating Factors   prolonged walking; walking uphill;    Pain Relieving Factors  tractions helps for an hour or so                      [facility] Adult PT Treatment/Exercise - 05/30/17 0001      Lumbar Exercises: Stretches   Single Knee to Chest Stretch  Right;Left;3 reps;20 seconds      Lumbar Exercises: Supine   Isometric Hip Flexion  10 reps    Other Supine Lumbar Exercises  neural glide sciatic 10x right and left      Lumbar Exercises: Prone   Other Prone Lumbar Exercises  press ups with sheet overpressure      Moist Heat Therapy   Number Minutes Moist Heat  10 Minutes    Moist  Heat Location  Lumbar Spine      Electrical Stimulation   Electrical Stimulation Location  right lumbar    Electrical Stimulation Action  IFC    Electrical Stimulation Parameters  10 ma 15 min    Electrical Stimulation Goals  Pain      Traction   Min (lbs)  50    Max (lbs)  100 initially increased to 110# but decreased     Hold Time  15    Rest Time  15    Time  15             PT Education - 05/30/17 1043    Education provided  Yes    Education Details  SKTC, neural gliding supine;  abdominal brace with hand to knee push;  press ups with overpressure    Person(s) Educated  Patient    Methods  Explanation;Demonstration;Handout    Comprehension  Returned demonstration;Verbalized understanding       PT Short Term Goals - 05/30/17 1930      PT SHORT TERM GOAL #1   Title  The patient will express understanding of basic self care strategies for pain relief and promotion of healing    Status  Achieved      PT SHORT TERM GOAL #2   Title  The patient will  report a 30% reduction in back and peripheral symptoms    Status  Achieved        PT Long Term Goals - 04/16/17 1207      PT LONG TERM GOAL #1   Title  The patient will be independent in safe self progression of HEP     Time  8    Period  Weeks    Status  New    Target Date  06/11/17      PT LONG TERM GOAL #2   Title  The patient will report a 60% reduction of back and right LE pain with sitting and other ADLs    Time  8    Period  Weeks    Status  New      PT LONG TERM GOAL #3   Title  The patient will be able to walk for 10-15 minutes needed for community ambulation    Time  8    Period  Weeks    Status  New      PT LONG TERM GOAL #4   Title  The patient will have full lumbar ROM with minimal pain needed for basic self care at home since he lives alone    Time  8    Period  Weeks    Status  New      PT LONG TERM GOAL #5   Title  Modified Oswestry score improved from 68% limitation to 56% indicating improved function with less pain    Time  8    Period  Weeks    Status  New            Plan - 05/30/17 1102    Clinical Impression Statement  The patient continues to be in moderate to severe pain.  Encouraged several walks per day and initiation of mobility and neural glides.  Pain no worse with increased exercise.  He reports temporary relief with mechanical traction.  Therapist closely monitoring response with all interventions.      Rehab Potential  Good    Clinical Impairments Affecting Rehab Potential  psychiatric history and numerous co-morbidities    PT  Frequency  2x / week    PT Duration  8 weeks    PT Treatment/Interventions  ADLs/Self Care Home Management;Cryotherapy;Electrical Stimulation;Ultrasound;Traction;Therapeutic activities;Therapeutic exercise;Neuromuscular re-education;Patient/family education;Manual techniques;Taping;Dry needling    PT Next Visit Plan  continue with functional reactivation; add femoral nerve glides in prone;  try quadruped bird  dogs;  traction as needed;  ES/heat        Patient will benefit from skilled therapeutic intervention in order to improve the following deficits and impairments:  Pain, Decreased range of motion, Decreased strength, Difficulty walking, Postural dysfunction, Decreased activity tolerance  Visit Diagnosis: Acute right-sided low back pain with right-sided sciatica  Muscle weakness (generalized)     Problem List Patient Active Problem List   Diagnosis Date Noted  . Achilles tendinitis of left lower extremity 12/07/2016  . Achilles tendinitis, left leg 05/17/2016  . Obstructive sleep apnea on CPAP 03/07/2015  . Post-operative state 03/07/2015  . Achilles rupture, left 10/21/2014  . OSA (obstructive sleep apnea) 04/23/2013  . Bipolar disorder, unspecified (HCC) 01/07/2013  . Sinus tachycardia 01/07/2013  . Headache(784.0) 01/07/2013  . SDH (subdural hematoma) (HCC) 01/04/2013  . H/O PE 01/04/2013  . Chronic anticoagulation 01/04/2013  . 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  . Acute sinusitis 01/04/2013   Lavinia Sharps, PT 05/30/17 7:31 PM Phone: [phone] Fax: [phone]  Vivien Presto 05/30/2017, 7:30 PM  [facility] [address], [address] [place], [place], [address] Phone: [phone]   Fax:  [phone]  Name: QUANTEZ SCHNYDER MRN: [id number] Date of Birth: 08-28-1965

## 2017-06-05 ENCOUNTER — Encounter: Payer: Self-pay | Admitting: Physical Therapy

## 2017-06-05 ENCOUNTER — Ambulatory Visit: Payer: Medicaid Other | Admitting: Physical Therapy

## 2017-06-05 DIAGNOSIS — M5441 Lumbago with sciatica, right side: Secondary | ICD-10-CM

## 2017-06-05 DIAGNOSIS — M6281 Muscle weakness (generalized): Secondary | ICD-10-CM

## 2017-06-05 NOTE — Therapy (Signed)
[facility] [address], [address] [place], [place], [address] Phone: [phone]   Fax:  [phone]  Physical Therapy Treatment  Patient Details  Name: Justin [name] MRN: [id number] Date of Birth: Jul 09, 1965 Referring Provider: Dr. Lajoyce Corners   Encounter Date: 06/05/2017  PT End of Session - 06/05/17 0946    Visit Number  5    Number of Visits  15    Date for PT Re-Evaluation  06/11/17    Authorization Type  Medicaid approved 12 visits from 3/12-4/22    PT Start Time  0946    PT Stop Time  1100    PT Time Calculation (min)  74 min    Activity Tolerance  Patient limited by pain    Behavior During Therapy  Flat affect Exhausted       Past Medical History:  Diagnosis Date  . Achilles rupture, left   . Bipolar 1 disorder (HCC)   . Dental caries    periodontitis  . Pneumonia   . Pulmonary embolism (HCC)   . Sleep apnea    wears CPAP  . Subdural hematoma (HCC) 2015    Past Surgical History:  Procedure Laterality Date  . ACHILLES TENDON SURGERY Left 10/21/2014   Procedure: Left Achilles Reconstruction;  Surgeon: Nadara Mustard, MD;  Location[facility] OR;  Service: Orthopedics;  Laterality: Left;  . APPENDECTOMY    . CRANIOTOMY N/A 01/19/2013   Procedure: CRANIOTOMY HEMATOMA EVACUATION SUBDURAL;  Surgeon: Hewitt Shorts, MD;  Location: [facility];  Service: Neurosurgery;  Laterality: N/A;  . CYSTECTOMY     right head  . ELBOW SURGERY     right  . FRACTURE SURGERY     finger  . I&D EXTREMITY Left 12/07/2016   Procedure: LEFT ACHILLES DEBRIDEMENT;  Surgeon: Nadara Mustard, MD;  Location: [facility] OR;  Service: Orthopedics;  Laterality: Left;  Marland Kitchen MULTIPLE EXTRACTIONS WITH ALVEOLOPLASTY N/A 03/07/2015   Procedure: MULTIPLE EXTRACTION WITH ALVEOLOPLASTY;  Surgeon: [name], DDS;  Location: [facility] OR;  Service: Oral Surgery;  Laterality: N/A;    There were no vitals filed for this visit.  Subjective Assessment - 06/05/17 0948    Subjective  My pain ramped up over the weekend. I feel ok for 1 - 1 1/2 hrs after PT session then my pain goes back.  I am so tired because I cannot sleep.    Pertinent History  end of March/early April MD    Limitations  Sitting;House hold activities    How long can you sit comfortably?  5 min     How long can you walk comfortably?  5 min     Patient Stated Goals  do this for Medicaid so they will pay for MRI      Currently in Pain?  Yes    Pain Score  9     Pain Location  -- Rt low back down back of leg and lateral calf. Burns in the lateral thigh    Pain Orientation  Right    Pain Descriptors / Indicators  Radiating    Aggravating Factors   Fairly constant at this point    Pain Relieving Factors  Traction,     Multiple Pain Sites  No                      [facility] Adult PT Treatment/Exercise - 06/05/17 0001      Moist Heat Therapy   Number Minutes Moist Heat  15 Minutes    Moist Heat Location  Lumbar Spine      Electrical Stimulation   Electrical Stimulation Location  right lumbar    Electrical Stimulation Action  IFC    Electrical Stimulation Parameters  80- 150 HZ    Electrical Stimulation Goals  Pain      Traction   Min (lbs)  50    Max (lbs)  100    Hold Time  20    Rest Time  20    Time  15               PT Short Term Goals - 05/30/17 1930      PT SHORT TERM GOAL #1   Title  The patient will express understanding of basic self care strategies for pain relief and promotion of healing    Status  Achieved      PT SHORT TERM GOAL #2   Title  The patient will report a 30% reduction in back and peripheral symptoms    Status  Achieved        PT Long Term Goals - 04/16/17 1207      PT LONG TERM GOAL #1   Title  The patient will be independent in safe self progression of HEP     Time  8    Period  Weeks    Status  New    Target Date  06/11/17      PT LONG TERM GOAL #2   Title  The patient will report a 60% reduction of back and right LE  pain with sitting and other ADLs    Time  8    Period  Weeks    Status  New      PT LONG TERM GOAL #3   Title  The patient will be able to walk for 10-15 minutes needed for community ambulation    Time  8    Period  Weeks    Status  New      PT LONG TERM GOAL #4   Title  The patient will have full lumbar ROM with minimal pain needed for basic self care at home since he lives alone    Time  8    Period  Weeks    Status  New      PT LONG TERM GOAL #5   Title  Modified Oswestry score improved from 68% limitation to 56% indicating improved function with less pain    Time  8    Period  Weeks    Status  New            Plan - 06/05/17 0946    Clinical Impression Statement  Pt presents today with reports of extreme pain along his RT leg into the lateral calf. He reports frustration, exhaustion and plans to see the MD next week. Pt tolerates the traction well and enjoys temporary relief. Pt nonplus regarding his exercises.     Rehab Potential  Good    Clinical Impairments Affecting Rehab Potential  psychiatric history and numerous co-morbidities    PT Frequency  2x / week    PT Duration  8 weeks    PT Treatment/Interventions  ADLs/Self Care Home Management;Cryotherapy;Electrical Stimulation;Ultrasound;Traction;Therapeutic activities;Therapeutic exercise;Neuromuscular re-education;Patient/family education;Manual techniques;Taping;Dry needling    PT Next Visit Plan  continue with functional reactivation; add femoral nerve glides in prone;  try quadruped bird dogs;  traction as needed;  ES/heat        Patient  will benefit from skilled therapeutic intervention in order to improve the following deficits and impairments:  Pain, Decreased range of motion, Decreased strength, Difficulty walking, Postural dysfunction, Decreased activity tolerance  Visit Diagnosis: Acute right-sided low back pain with right-sided sciatica  Muscle weakness (generalized)     Problem List Patient Active  Problem List   Diagnosis Date Noted  . Achilles tendinitis of left lower extremity 12/07/2016  . Achilles tendinitis, left leg 05/17/2016  . Obstructive sleep apnea on CPAP 03/07/2015  . Post-operative state 03/07/2015  . Achilles rupture, left 10/21/2014  . OSA (obstructive sleep apnea) 04/23/2013  . Bipolar disorder, unspecified (HCC) 01/07/2013  . Sinus tachycardia 01/07/2013  . Headache(784.0) 01/07/2013  . SDH (subdural hematoma) (HCC) 01/04/2013  . H/O PE 01/04/2013  . Chronic anticoagulation 01/04/2013  . 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  . Acute sinusitis 01/04/2013    Cristabel Bicknell, PTA 06/05/2017, 10:48 AM  [facility] [address] [place], [place], [address] Phone: [phone]   Fax:  [phone]  Name: Justin [name] MRN: [id number] Date of Birth: 07-17-1965

## 2017-06-07 ENCOUNTER — Telehealth: Payer: Self-pay | Admitting: Physical Therapy

## 2017-06-07 ENCOUNTER — Ambulatory Visit: Payer: Medicaid Other | Admitting: Physical Therapy

## 2017-06-07 NOTE — Telephone Encounter (Signed)
Patient no-showed for appt.  Called and spoke with him and he reports his "back was really hurting."  Reminded him that his next appt is Tuesday at 8:45.  He sees Dr. Lajoyce Corners later that day.

## 2017-06-11 ENCOUNTER — Telehealth (INDEPENDENT_AMBULATORY_CARE_PROVIDER_SITE_OTHER): Payer: Self-pay | Admitting: Orthopedic Surgery

## 2017-06-11 ENCOUNTER — Ambulatory Visit (INDEPENDENT_AMBULATORY_CARE_PROVIDER_SITE_OTHER): Payer: Medicaid Other | Admitting: Orthopedic Surgery

## 2017-06-11 ENCOUNTER — Ambulatory Visit: Payer: Medicaid Other

## 2017-06-11 ENCOUNTER — Encounter (INDEPENDENT_AMBULATORY_CARE_PROVIDER_SITE_OTHER): Payer: Self-pay | Admitting: Orthopedic Surgery

## 2017-06-11 DIAGNOSIS — M5441 Lumbago with sciatica, right side: Secondary | ICD-10-CM | POA: Diagnosis not present

## 2017-06-11 DIAGNOSIS — M6281 Muscle weakness (generalized): Secondary | ICD-10-CM

## 2017-06-11 NOTE — Telephone Encounter (Signed)
Patient advised.

## 2017-06-11 NOTE — Therapy (Addendum)
[facility] [address] [place], [place], [address] Phone: [phone]   Fax:  [phone]  Physical Therapy Treatment/Discharge Summary  Patient Details  Name: Justin [name] MRN: [id number] Date of Birth: 02-29-1966 Referring Provider: Dr. Sharol Given   Encounter Date: 06/11/2017  PT End of Session - 06/11/17 0909    Visit Number  6    Date for PT Re-Evaluation  06/11/17    Authorization Type  Medicaid approved 12 visits from 3/12-4/22    Authorization - Visit Number  3    Authorization - Number of Visits  12    PT Start Time  6599    PT Stop Time  0935    PT Time Calculation (min)  48 min    Activity Tolerance  Patient limited by pain       Past Medical History:  Diagnosis Date  . Achilles rupture, left   . Bipolar 1 disorder ([place])   . Dental caries    periodontitis  . Pneumonia   . Pulmonary embolism ([place])   . Sleep apnea    wears CPAP  . Subdural hematoma ([place]) 2015    Past Surgical History:  Procedure Laterality Date  . ACHILLES TENDON SURGERY Left 10/21/2014   Procedure: Left Achilles Reconstruction;  Surgeon: Newt Minion, MD;  Location: [facility];  Service: Orthopedics;  Laterality: Left;  . APPENDECTOMY    . CRANIOTOMY N/A 01/19/2013   Procedure: CRANIOTOMY HEMATOMA EVACUATION SUBDURAL;  Surgeon: Hosie Spangle, MD;  Location: [facility];  Service: Neurosurgery;  Laterality: N/A;  . CYSTECTOMY     right head  . ELBOW SURGERY     right  . FRACTURE SURGERY     finger  . I&D EXTREMITY Left 12/07/2016   Procedure: LEFT ACHILLES DEBRIDEMENT;  Surgeon: Newt Minion, MD;  Location: [facility];  Service: Orthopedics;  Laterality: Left;  Marland Kitchen MULTIPLE EXTRACTIONS WITH ALVEOLOPLASTY N/A 03/07/2015   Procedure: MULTIPLE EXTRACTION WITH ALVEOLOPLASTY;  Surgeon: Diona Browner, DDS;  Location: [place];  Service: Oral Surgery;  Laterality: N/A;    There were no vitals filed for this visit.  Subjective Assessment -  06/11/17 0850    Subjective  I am still really hurting.  Pain shoots to the Rt knee.      Currently in Pain?  Yes    Pain Score  9     Pain Orientation  Right    Pain Descriptors / Indicators  Radiating    Pain Type  Acute pain    Pain Onset  More than a month ago    Pain Frequency  Constant    Aggravating Factors   fairly constant at this point    Pain Relieving Factors  traction (temporary), stretching         [facility] PT Assessment - 06/11/17 0001      Assessment   Medical Diagnosis  Low back pain             No data recorded       [facility] Adult PT Treatment/Exercise - 06/11/17 0001      Moist Heat Therapy   Number Minutes Moist Heat  15 Minutes    Moist Heat Location  Lumbar Spine      Electrical Stimulation   Electrical Stimulation Location  right lumbar    Electrical Stimulation Action  IFC    Electrical Stimulation Parameters  15 minutes    Electrical Stimulation Goals  Pain  Traction   Type of Traction  Lumbar    Min (lbs)  50    Max (lbs)  100    Hold Time  20    Rest Time  20    Time  15               PT Short Term Goals - 06/11/17 4650      PT SHORT TERM GOAL #2   Title  The patient will report a 30% reduction in back and peripheral symptoms    Time  4    Period  Weeks    Status  Not Met        PT Long Term Goals - 06/11/17 3546      PT LONG TERM GOAL #1   Title  The patient will be independent in safe self progression of HEP     Time  8    Period  Weeks    Status  On-going            Plan - 06/11/17 0900    Clinical Impression Statement  Pt with significant pain reported in Rt LE that is constant.  Pt reports that he is not able to do much activity outside of his house and is not sleeping due to pain.  Pt rates the pain as 9/10 today.  Pt is performing exercise at home issued by PT.  Pt reports temporary reduction in symptoms with lumbar traction and electrical stimulation.  Pt will see MD today to discuss options  due to lack of progress with PT and conitinued Rt LE radiculopathy.      Rehab Potential  Good    Clinical Impairments Affecting Rehab Potential  psychiatric history and numerous co-morbidities    PT Next Visit Plan  see what MD says.  Probable D/C due to lack of progress.  Pt will call to let us know.  D/C chart if pt isn't going to return.  Renewal needed if pt does return.      Recommended Other Services  initial certification is signed    Consulted and Agree with Plan of Care  Patient          PHYSICAL THERAPY DISCHARGE SUMMARY  Visits from Start of Care: 6  Current functional level related to goals / functional outcomes: The patient attended 6 visits for back and LE symptoms.  He had temporary relief from mechanical traction and modalities but no lasting centralization.  Following his MRI he planned to get an injection and he has not returned to PT.  Left several phone messages.     Remaining deficits: As above   Education / Equipment: HEP,self care strategies Plan:                                                    Patient goals were not met. Patient is being discharged due to not returning since the last visit.  ?????       Patient will benefit from skilled therapeutic intervention in order to improve the following deficits and impairments:  Pain, Decreased range of motion, Decreased strength, Difficulty walking, Postural dysfunction, Decreased activity tolerance  Visit Diagnosis: Muscle weakness (generalized)  Acute right-sided low back pain with right-sided sciatica     Problem List Patient Active Problem List   Diagnosis Date Noted  . Achilles  tendinitis of left lower extremity 12/07/2016  . Achilles tendinitis, left leg 05/17/2016  . Obstructive sleep apnea on CPAP 03/07/2015  . Post-operative state 03/07/2015  . Achilles rupture, left 10/21/2014  . OSA (obstructive sleep apnea) 04/23/2013  . Bipolar disorder, unspecified ([place]) 01/07/2013  . Sinus  tachycardia 01/07/2013  . Headache(784.0) 01/07/2013  . SDH (subdural hematoma) ([place]) 01/04/2013  . H/O PE 01/04/2013  . Chronic anticoagulation 01/04/2013  . Warfarin-induced coagulopathy ([place]) 01/04/2013  . Acute sinusitis 01/04/2013   Ruben Im, PT 07/16/17 8:28 AM Phone: [phone] Fax: [phone]  Sigurd Sos, PT 06/11/17 9:12 AM  [facility] [address], [place], [place], [address] Phone: [phone]   Fax:  [phone]  Name: Justin [name] MRN: [id number] Date of Birth: February 15, 1966

## 2017-06-11 NOTE — Telephone Encounter (Signed)
Please advise 

## 2017-06-11 NOTE — Telephone Encounter (Signed)
Use heat, there is no stronger muscle relaxant

## 2017-06-11 NOTE — Progress Notes (Signed)
Office Visit Note   Patient: Justin [name]           Date of Birth: 1965-03-30           MRN: [id number] Visit Date: 06/11/2017              Requested by: [facility] [address] [place], [place] [address] PCP: Pa, [facility]  Chief Complaint  Patient presents with  . Lower Back - Follow-up      HPI: Patient is a 52 year old gentleman who still has lower back pain with radicular symptoms in the lateral aspect of his right leg.  He has been doing formalized physical therapy.  He states that on his way home his back feels better for about an hour and then the pain reoccurs.  Patient states the does not feel comfortable leaving the home secondary to his lower back pain he states he spends a lot of time sleeping he states he could not take his knees back to college because he could not drive for prolonged periods of time.  Assessment & Plan: Visit Diagnoses:  1. Acute back pain with sciatica, right     Plan: We will request an MRI scan due to failure of conservative therapy with nonsteroidals including Naprosyn and physical therapy for 6 weeks.  Follow-Up Instructions: Return if symptoms worsen or fail to improve.   Ortho Exam  Patient is alert, oriented, no adenopathy, well-dressed, normal affect, normal respiratory effort. Examination patient has an antalgic gait he has a negative straight leg raise on the right.  Motor strength is symmetrical in all motor groups of both lower extremities subjectively he has burning numbness down the right lower extremity with still persists.  Imaging: No results found. No images are attached to the encounter.  Labs: Lab Results  Component Value Date   REPTSTATUS 01/11/2013 FINAL 01/04/2013   CULT  01/04/2013    NO GROWTH 5 DAYS Performed at [facility]    @LABSALLVALUES (HGBA1)@  There is no height or weight on file to calculate BMI.  Orders:  Orders Placed This Encounter  Procedures  . MR Lumbar Spine w/o  contrast   No orders of the defined types were placed in this encounter.    Procedures: No procedures performed  Clinical Data: No additional findings.  ROS:  All other systems negative, except as noted in the HPI. Review of Systems  Objective: Vital Signs: There were no vitals taken for this visit.  Specialty Comments:  No specialty comments available.  PMFS History: Patient Active Problem List   Diagnosis Date Noted  . Achilles tendinitis of left lower extremity 12/07/2016  . Achilles tendinitis, left leg 05/17/2016  . Obstructive sleep apnea on CPAP 03/07/2015  . Post-operative state 03/07/2015  . Achilles rupture, left 10/21/2014  . OSA (obstructive sleep apnea) 04/23/2013  . Bipolar disorder, unspecified (HCC) 01/07/2013  . Sinus tachycardia 01/07/2013  . Headache(784.0) 01/07/2013  . SDH (subdural hematoma) (HCC) 01/04/2013  . H/O PE 01/04/2013  . Chronic anticoagulation 01/04/2013  . 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  . Acute sinusitis 01/04/2013   Past Medical History:  Diagnosis Date  . Achilles rupture, left   . Bipolar 1 disorder (HCC)   . Dental caries    periodontitis  . Pneumonia   . Pulmonary embolism (HCC)   . Sleep apnea    wears CPAP  . Subdural hematoma (HCC) 2015    Family History  Problem Relation Age of Onset  . Hypertension Mother   .  Stroke Mother   . Multiple sclerosis Sister   . Down syndrome Son     Past Surgical History:  Procedure Laterality Date  . ACHILLES TENDON SURGERY Left 10/21/2014   Procedure: Left Achilles Reconstruction;  Surgeon: Nadara Mustard, MD;  Location: [facility] OR;  Service: Orthopedics;  Laterality: Left;  . APPENDECTOMY    . CRANIOTOMY N/A 01/19/2013   Procedure: CRANIOTOMY HEMATOMA EVACUATION SUBDURAL;  Surgeon: Hewitt Shorts, MD;  [facility]: [facility];  Service: Neurosurgery;  Laterality: N/A;  . CYSTECTOMY     right head  . ELBOW SURGERY     right  . FRACTURE SURGERY     finger  . I&D  EXTREMITY Left 12/07/2016   Procedure: LEFT ACHILLES DEBRIDEMENT;  Surgeon: Nadara Mustard, MD;  Location: [facility] OR;  Service: Orthopedics;  Laterality: Left;  Marland Kitchen MULTIPLE EXTRACTIONS WITH ALVEOLOPLASTY N/A 03/07/2015   Procedure: MULTIPLE EXTRACTION WITH ALVEOLOPLASTY;  Surgeon: [name], DDS;  Location: [facility] OR;  Service: Oral Surgery;  Laterality: N/A;   Social History   Occupational History  . Occupation: disability  Tobacco Use  . Smoking status: Never Smoker  . Smokeless tobacco: Never Used  Substance and Sexual Activity  . Alcohol use: No  . Drug use: No  . Sexual activity: Not on file

## 2017-06-11 NOTE — Telephone Encounter (Signed)
Patient called requesting a stronger muscle relaxer.  He stated that his back was in severe pain.  CB#[phone].  Thank you.

## 2017-06-13 ENCOUNTER — Encounter: Payer: Medicaid Other | Admitting: Physical Therapy

## 2017-06-16 ENCOUNTER — Ambulatory Visit
Admission: RE | Admit: 2017-06-16 | Discharge: 2017-06-16 | Disposition: A | Discharge status: Home / Self Care | Payer: Medicaid Other | Source: Ambulatory Visit | Attending: Orthopedic Surgery | Admitting: Orthopedic Surgery

## 2017-06-16 DIAGNOSIS — M5441 Lumbago with sciatica, right side: Secondary | ICD-10-CM

## 2017-06-18 ENCOUNTER — Encounter: Payer: Medicaid Other | Admitting: Physical Therapy

## 2017-06-19 ENCOUNTER — Ambulatory Visit: Payer: Medicaid Other | Admitting: Physical Therapy

## 2017-06-19 ENCOUNTER — Telehealth: Payer: Self-pay | Admitting: Physical Therapy

## 2017-06-20 ENCOUNTER — Ambulatory Visit: Payer: Medicaid Other | Attending: Orthopedic Surgery | Admitting: Physical Therapy

## 2017-06-20 ENCOUNTER — Telehealth: Payer: Self-pay | Admitting: Physical Therapy

## 2017-06-20 NOTE — Telephone Encounter (Signed)
Patient no showed for appt.  He has another family member who is ill/dying.  We decided to hold PT until after his doctor's appt next week.

## 2017-06-25 ENCOUNTER — Encounter: Payer: Medicaid Other | Admitting: Physical Therapy

## 2017-06-25 ENCOUNTER — Encounter (INDEPENDENT_AMBULATORY_CARE_PROVIDER_SITE_OTHER): Payer: Self-pay | Admitting: Orthopedic Surgery

## 2017-06-25 ENCOUNTER — Ambulatory Visit (INDEPENDENT_AMBULATORY_CARE_PROVIDER_SITE_OTHER): Payer: Medicaid Other | Admitting: Orthopedic Surgery

## 2017-06-25 DIAGNOSIS — M5410 Radiculopathy, site unspecified: Secondary | ICD-10-CM | POA: Diagnosis not present

## 2017-06-25 DIAGNOSIS — M4316 Spondylolisthesis, lumbar region: Secondary | ICD-10-CM | POA: Insufficient documentation

## 2017-06-25 NOTE — Progress Notes (Signed)
Office Visit Note   Patient: Justin [name]           Date of Birth: [birth year]           MRN: [id number] Visit Date: 06/25/2017              Requested by: [facility] [address] [place], [place] [address] PCP: [facility], [facility]  Chief Complaint  Patient presents with  . Lower Back - Follow-up    MRI review      HPI: Patient is a 52 year old gentleman with spondylolisthesis lumbar spine patient has been going to physical therapy this was placed due to increasing pain.  Patient states he tried a heating pad to his back.  Patient states his radicular symptoms are down the right lower extremity.  Assessment & Plan: Visit Diagnoses:  1. Spondylolisthesis, lumbar region   2. Radicular pain of right lower extremity     Plan: Will have patient evaluated with Dr. Alvester Morin for right-sided injection for possible transforaminal injection.  Follow-Up Instructions: Return if symptoms worsen or fail to improve.   Ortho Exam  Patient is alert, oriented, no adenopathy, well-dressed, normal affect, normal respiratory effort. Examination patient has an antalgic gait he has a negative straight leg raise on the right he has good motor strength in right lower extremity with no focal motor weakness.  Review of the MRI scan shows a pars defect with cystic changes with disc bulging with bilateral impingement L5-S1 however patient's symptoms are on the right side.  Imaging: No results found. No images are attached to the encounter.  Labs: Lab Results  Component Value Date   REPTSTATUS 01/11/2013 FINAL 01/04/2013   CULT  01/04/2013    NO GROWTH 5 DAYS Performed at [facility]    @LABSALLVALUES (HGBA1)@  There is no height or weight on file to calculate BMI.  Orders:  Orders Placed This Encounter  Procedures  . Ambulatory referral to Physical Medicine Rehab   No orders of the defined types were placed in this encounter.    Procedures: No procedures  performed  Clinical Data: No additional findings.  ROS:  All other systems negative, except as noted in the HPI. Review of Systems  Objective: Vital Signs: There were no vitals taken for this visit.  Specialty Comments:  No specialty comments available.  PMFS History: Patient Active Problem List   Diagnosis Date Noted  . Spondylolisthesis, lumbar region 06/25/2017  . Radicular pain of right lower extremity 06/25/2017  . Achilles tendinitis of left lower extremity 12/07/2016  . Achilles tendinitis, left leg 05/17/2016  . Obstructive sleep apnea on CPAP 03/07/2015  . Post-operative state 03/07/2015  . Achilles rupture, left 10/21/2014  . OSA (obstructive sleep apnea) 04/23/2013  . Bipolar disorder, unspecified (HCC) 01/07/2013  . Sinus tachycardia 01/07/2013  . Headache(784.0) 01/07/2013  . SDH (subdural hematoma) (HCC) 01/04/2013  . H/O PE 01/04/2013  . Chronic anticoagulation 01/04/2013  . 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  . Acute sinusitis 01/04/2013   Past Medical History:  Diagnosis Date  . Achilles rupture, left   . Bipolar 1 disorder (HCC)   . Dental caries    periodontitis  . Pneumonia   . Pulmonary embolism (HCC)   . Sleep apnea    wears CPAP  . Subdural hematoma (HCC) 2015    Family History  Problem Relation Age of Onset  . Hypertension Mother   . Stroke Mother   . Multiple sclerosis Sister   . Down syndrome Son  Past Surgical History:  Procedure Laterality Date  . ACHILLES TENDON SURGERY Left 10/21/2014   Procedure: Left Achilles Reconstruction;  Surgeon: Nadara Mustard, MD;  Location: [facility] OR;  Service: Orthopedics;  Laterality: Left;  . APPENDECTOMY    . CRANIOTOMY N/A 01/19/2013   Procedure: CRANIOTOMY HEMATOMA EVACUATION SUBDURAL;  Surgeon: Hewitt Shorts, MD;  Location[facility];  Service: Neurosurgery;  Laterality: N/A;  . CYSTECTOMY     right head  . ELBOW SURGERY     right  . FRACTURE SURGERY     finger  . I&D  EXTREMITY Left 12/07/2016   Procedure: LEFT ACHILLES DEBRIDEMENT;  Surgeon: Nadara Mustard, MD;  Location: [facility] OR;  Service: Orthopedics;  Laterality: Left;  Marland Kitchen MULTIPLE EXTRACTIONS WITH ALVEOLOPLASTY N/A 03/07/2015   Procedure: MULTIPLE EXTRACTION WITH ALVEOLOPLASTY;  Surgeon: [name], DDS;  Location: [facility];  Service: Oral Surgery;  Laterality: N/A;   Social History   Occupational History  . Occupation: disability  Tobacco Use  . Smoking status: Never Smoker  . Smokeless tobacco: Never Used  Substance and Sexual Activity  . Alcohol use: No  . Drug use: No  . Sexual activity: Not on file

## 2017-06-27 ENCOUNTER — Ambulatory Visit: Payer: Medicaid Other | Admitting: Physical Therapy

## 2017-06-27 ENCOUNTER — Telehealth: Payer: Self-pay | Admitting: Physical Therapy

## 2017-06-27 NOTE — Telephone Encounter (Signed)
Called and left message for patient to call.  No-show for PT appt today.

## 2017-07-02 ENCOUNTER — Ambulatory Visit: Payer: Medicaid Other | Admitting: Physical Therapy

## 2017-07-04 ENCOUNTER — Ambulatory Visit: Payer: Medicaid Other | Admitting: Physical Therapy

## 2017-07-15 ENCOUNTER — Telehealth (INDEPENDENT_AMBULATORY_CARE_PROVIDER_SITE_OTHER): Payer: Self-pay | Admitting: Orthopedic Surgery

## 2017-07-15 ENCOUNTER — Ambulatory Visit (INDEPENDENT_AMBULATORY_CARE_PROVIDER_SITE_OTHER): Payer: Self-pay

## 2017-07-15 ENCOUNTER — Ambulatory Visit (INDEPENDENT_AMBULATORY_CARE_PROVIDER_SITE_OTHER): Payer: Medicaid Other | Admitting: Physical Medicine and Rehabilitation

## 2017-07-15 ENCOUNTER — Encounter (INDEPENDENT_AMBULATORY_CARE_PROVIDER_SITE_OTHER): Payer: Self-pay | Admitting: Physical Medicine and Rehabilitation

## 2017-07-15 ENCOUNTER — Telehealth (INDEPENDENT_AMBULATORY_CARE_PROVIDER_SITE_OTHER): Payer: Self-pay | Admitting: *Deleted

## 2017-07-15 VITALS — BP 122/86 | HR 87 | Temp 98.4°F

## 2017-07-15 DIAGNOSIS — M5416 Radiculopathy, lumbar region: Secondary | ICD-10-CM

## 2017-07-15 MED ORDER — BETAMETHASONE SOD PHOS & ACET 6 (3-3) MG/ML IJ SUSP
12.0000 mg | Freq: Once | INTRAMUSCULAR | Status: AC
  Administered 2017-07-15: 12 mg

## 2017-07-15 NOTE — Telephone Encounter (Signed)
Please advise 

## 2017-07-15 NOTE — Progress Notes (Signed)
 .  Numeric Pain Rating Scale and Functional Assessment Average Pain 9   In the last MONTH (on 0-10 scale) has pain interfered with the following?  1. General activity like being  able to carry out your everyday physical activities such as walking, climbing stairs, carrying groceries, or moving a chair?  Rating(5)   +Driver, -BT, -Dye Allergies.  

## 2017-07-15 NOTE — Patient Instructions (Signed)

## 2017-07-15 NOTE — Procedures (Signed)
Lumbosacral Transforaminal Epidural Steroid Injection - Sub-Pedicular Approach with Fluoroscopic Guidance  Patient: Justin [name]      Date of Birth: Oct 07, 1965 MRN: [id number] PCP: [facility]      Visit Date: 07/15/2017   Universal Protocol:    Date/Time: 07/15/2017  Consent Given By: the patient  Position: PRONE  Additional Comments: Vital signs were monitored before and after the procedure. Patient was prepped and draped in the usual sterile fashion. The correct patient, procedure, and site was verified.   Injection Procedure Details:  Procedure Site One Meds Administered:  Meds ordered this encounter  Medications  . betamethasone acetate-betamethasone sodium phosphate (CELESTONE) injection 12 mg    Laterality: Right  Location/Site:  L5-S1  Needle size: 22 G  Needle type: Spinal  Needle Placement: Transforaminal  Findings:    -Comments: Excellent flow of contrast along the nerve and into the epidural space.  Procedure Details: After squaring off the end-plates to get a true AP view, the C-arm was positioned so that an oblique view of the foramen as noted above was visualized. The target area is just inferior to the "nose of the scotty dog" or sub pedicular. The soft tissues overlying this structure were infiltrated with 2-3 ml. of 1% Lidocaine without Epinephrine.  The spinal needle was inserted toward the target using a "trajectory" view along the fluoroscope beam.  Under AP and lateral visualization, the needle was advanced so it did not puncture dura and was located close the 6 O'Clock position of the pedical in AP tracterory. Biplanar projections were used to confirm position. Aspiration was confirmed to be negative for CSF and/or blood. A 1-2 ml. volume of Isovue-250 was injected and flow of contrast was noted at each level. Radiographs were obtained for documentation purposes.   After attaining the desired flow of contrast documented above, a 0.5 to 1.0  ml test dose of 0.25% Marcaine was injected into each respective transforaminal space.  The patient was observed for 90 seconds post injection.  After no sensory deficits were reported, and normal lower extremity motor function was noted,   the above injectate was administered so that equal amounts of the injectate were placed at each foramen (level) into the transforaminal epidural space.   Additional Comments:  The patient tolerated the procedure well Dressing: Band-Aid    Post-procedure details: Patient was observed during the procedure. Post-procedure instructions were reviewed.  Patient left the clinic in stable condition.

## 2017-07-15 NOTE — Progress Notes (Signed)
ODIE [name] - 52 y.o. male MRN [id number]  Date of birth: 05-20-1965  Office Visit Note: Visit Date: 07/15/2017 PCP: [facility] Referred by: [facility]  Subjective: Chief Complaint  Patient presents with  . Lower Back - Pain  . Left Leg - Pain   HPI: [name] is a 52 year old patient with right radicular leg pain is been ongoing now for several weeks.  He has had off-and-on back pain for years.  He reports severe 9 out of 10 pain.  Has been using anti-inflammatories and muscle relaxers.  Has been to a few sessions of physical therapy but states this only helps when he is doing the therapy does not help afterward.  He reports worsening with walking for any length of time.  He does get some relief with sitting.  MRI evidence of bilateral pars defects and bilateral foraminal narrowing.  This is at L5-S1.  I am going to complete a right L5 transforaminal epidural steroid injection diagnostically hopefully therapeutically.   ROS Otherwise per HPI.  Assessment & Plan: Visit Diagnoses:  1. Lumbar radiculopathy     Plan: No additional findings.   Meds & Orders:  Meds ordered this encounter  Medications  . betamethasone acetate-betamethasone sodium phosphate (CELESTONE) injection 12 mg    Orders Placed This Encounter  Procedures  . XR C-ARM NO REPORT  . Epidural Steroid injection    Follow-up: Return if symptoms worsen or fail to improve.   Procedures: No procedures performed  Lumbosacral Transforaminal Epidural Steroid Injection - Sub-Pedicular Approach with Fluoroscopic Guidance  Patient: Justin [name]      Date of Birth: 02/24/1966 MRN: [id number] PCP: [facility]      Visit Date: 07/15/2017   Universal Protocol:    Date/Time: 07/15/2017  Consent Given By: the patient  Position: PRONE  Additional Comments: Vital signs were monitored before and after the procedure. Patient was prepped and draped in the usual sterile fashion. The correct  patient, procedure, and site was verified.   Injection Procedure Details:  Procedure Site One Meds Administered:  Meds ordered this encounter  Medications  . betamethasone acetate-betamethasone sodium phosphate (CELESTONE) injection 12 mg    Laterality: Right  Location/Site:  L5-S1  Needle size: 22 G  Needle type: Spinal  Needle Placement: Transforaminal  Findings:    -Comments: Excellent flow of contrast along the nerve and into the epidural space.  Procedure Details: After squaring off the end-plates to get a true AP view, the C-arm was positioned so that an oblique view of the foramen as noted above was visualized. The target area is just inferior to the "nose of the scotty dog" or sub pedicular. The soft tissues overlying this structure were infiltrated with 2-3 ml. of 1% Lidocaine without Epinephrine.  The spinal needle was inserted toward the target using a "trajectory" view along the fluoroscope beam.  Under AP and lateral visualization, the needle was advanced so it did not puncture dura and was located close the 6 O'Clock position of the pedical in AP tracterory. Biplanar projections were used to confirm position. Aspiration was confirmed to be negative for CSF and/or blood. A 1-2 ml. volume of Isovue-250 was injected and flow of contrast was noted at each level. Radiographs were obtained for documentation purposes.   After attaining the desired flow of contrast documented above, a 0.5 to 1.0 ml test dose of 0.25% Marcaine was injected into each respective transforaminal space.  The patient was observed for 90  seconds post injection.  After no sensory deficits were reported, and normal lower extremity motor function was noted,   the above injectate was administered so that equal amounts of the injectate were placed at each foramen (level) into the transforaminal epidural space.   Additional Comments:  The patient tolerated the procedure well Dressing: Band-Aid     Post-procedure details: Patient was observed during the procedure. Post-procedure instructions were reviewed.  Patient left the clinic in stable condition.    Clinical History: MRI LUMBAR SPINE WITHOUT CONTRAST  TECHNIQUE: Multiplanar, multisequence MR imaging of the lumbar spine was performed. No intravenous contrast was administered.  COMPARISON:  None.  FINDINGS: Segmentation:  Standard.  Alignment:  Grade 1 anterolisthesis at L5-S1  Vertebrae: Chronic bilateral pars defects at L5 with marginal cystic change. No marrow edema. No acute fracture, discitis, or aggressive bone lesion.  Conus medullaris and cauda equina: Conus extends to the L1 level. Conus and cauda equina appear normal.  Paraspinal and other soft tissues: Negative  Disc levels:  T11-12: Disc narrowing and mild bulging.  T12- L1: Unremarkable.  L1-L2: Unremarkable.  L2-L3: Unremarkable.  L3-L4: Unremarkable.  L4-L5: Unremarkable.  L5-S1:Chronic bilateral pars defects at L5 with marginal cystic change including a 5 mm cyst at the left foramen. There is spurring at the pseudo articulations with distortion greater on the right. The L5-S1 disc is mildly desiccated, narrowed, and bulging. These factors cause biforaminal impingement, greater on the right. Patent canal.  IMPRESSION: 1. 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 These changes with listhesis and disc bulging cause biforaminal impingement at L5-S1. 2. The other lumbar levels are negative.   Electronically Signed   By: Marnee Spring M.D.   On: 06/16/2017 12:52   He reports that he has never smoked. He has never used smokeless tobacco. No results for input(s): HGBA1C, LABURIC in the last 8760 hours.  Objective:  VS:  HT:    WT:   BMI:     BP:122/86  HR:87bpm  TEMP:98.4 F (36.9 C)(Oral)  RESP:97 % Physical Exam  Ortho  Exam Imaging: Xr C-arm No Report  Result Date: 07/15/2017 Please see Notes or Procedures tab for imaging impression.   Past Medical/Family/Surgical/Social History: Medications & Allergies reviewed per EMR, new medications updated. Patient Active Problem List   Diagnosis Date Noted  . Spondylolisthesis, lumbar region 06/25/2017  . Radicular pain of right lower extremity 06/25/2017  . Achilles tendinitis of left lower extremity 12/07/2016  . Achilles tendinitis, left leg 05/17/2016  . Obstructive sleep apnea on CPAP 03/07/2015  . Post-operative state 03/07/2015  . Achilles rupture, left 10/21/2014  . OSA (obstructive sleep apnea) 04/23/2013  . Bipolar disorder, unspecified (HCC) 01/07/2013  . Sinus tachycardia 01/07/2013  . Headache(784.0) 01/07/2013  . SDH (subdural hematoma) (HCC) 01/04/2013  . H/O PE 01/04/2013  . Chronic anticoagulation 01/04/2013  . 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  . Acute sinusitis 01/04/2013   Past Medical History:  Diagnosis Date  . Achilles rupture, left   . Bipolar 1 disorder (HCC)   . Dental caries    periodontitis  . Pneumonia   . Pulmonary embolism (HCC)   . Sleep apnea    wears CPAP  . Subdural hematoma (HCC) 2015   Family History  Problem Relation Age of Onset  . Hypertension Mother   . Stroke Mother   . Multiple sclerosis Sister   . Down syndrome Son    Past  Surgical History:  Procedure Laterality Date  . ACHILLES TENDON SURGERY Left 10/21/2014   Procedure: Left Achilles Reconstruction;  Surgeon: Nadara Mustard, MD;  Location: [facility] OR;  Service: Orthopedics;  Laterality: Left;  . APPENDECTOMY    . CRANIOTOMY N/A 01/19/2013   Procedure: CRANIOTOMY HEMATOMA EVACUATION SUBDURAL;  Surgeon: Hewitt Shorts, MD;  Location: [facility];  Service: Neurosurgery;  Laterality: N/A;  . CYSTECTOMY     right head  . ELBOW SURGERY     right  . FRACTURE SURGERY     finger  . I&D EXTREMITY Left 12/07/2016   Procedure: LEFT ACHILLES  DEBRIDEMENT;  Surgeon: Nadara Mustard, MD;  Location: [facility] OR;  Service: Orthopedics;  Laterality: Left;  Marland Kitchen MULTIPLE EXTRACTIONS WITH ALVEOLOPLASTY N/A 03/07/2015   Procedure: MULTIPLE EXTRACTION WITH ALVEOLOPLASTY;  Surgeon: [name], DDS;  Location: [facility] OR;  Service: Oral Surgery;  Laterality: N/A;   Social History   Occupational History  . Occupation: disability  Tobacco Use  . Smoking status: Never Smoker  . Smokeless tobacco: Never Used  Substance and Sexual Activity  . Alcohol use: No  . Drug use: No  . Sexual activity: Not on file

## 2017-07-16 ENCOUNTER — Other Ambulatory Visit (INDEPENDENT_AMBULATORY_CARE_PROVIDER_SITE_OTHER): Payer: Self-pay | Admitting: Orthopedic Surgery

## 2017-07-16 MED ORDER — OXYCODONE-ACETAMINOPHEN 5-325 MG PO TABS: 1.0000 | ORAL_TABLET | Freq: Three times a day (TID) | ORAL | 0 refills | Status: DC | PRN

## 2017-07-16 NOTE — Telephone Encounter (Signed)
rx written

## 2017-07-16 NOTE — Telephone Encounter (Signed)
refill 

## 2017-07-16 NOTE — Telephone Encounter (Signed)
Called and lm on vm to advise that rx is at the front desk for pick up. To call with questions.  

## 2017-07-16 NOTE — Telephone Encounter (Signed)
New call, patient should not require continued muscle relaxants.

## 2017-07-18 NOTE — Telephone Encounter (Signed)
Patient said they had received a call stating muscle relaxers were filled and then patient went to pick them up and it was denied. Patient does not know why, please advise # [phone]

## 2017-07-18 NOTE — Telephone Encounter (Signed)
I called and lm on vm to advise that Dr. Lajoyce Corners had refilled her percocet and that this is available for pick up at the front desk. He did not refill the muscle relaxer. To call if he has any questions.

## 2017-07-21 ENCOUNTER — Emergency Department (HOSPITAL_COMMUNITY): Payer: Medicaid Other

## 2017-07-21 ENCOUNTER — Encounter (HOSPITAL_COMMUNITY): Payer: Self-pay

## 2017-07-21 ENCOUNTER — Emergency Department (HOSPITAL_COMMUNITY)
Admission: EM | Admit: 2017-07-21 | Discharge: 2017-07-21 | Disposition: A | Discharge status: Home / Self Care | Payer: Medicaid Other | Attending: Emergency Medicine | Admitting: Emergency Medicine

## 2017-07-21 ENCOUNTER — Other Ambulatory Visit: Payer: Self-pay

## 2017-07-21 DIAGNOSIS — Z79899 Other long term (current) drug therapy: Secondary | ICD-10-CM | POA: Insufficient documentation

## 2017-07-21 DIAGNOSIS — K30 Functional dyspepsia: Secondary | ICD-10-CM | POA: Diagnosis not present

## 2017-07-21 DIAGNOSIS — R0602 Shortness of breath: Secondary | ICD-10-CM | POA: Insufficient documentation

## 2017-07-21 DIAGNOSIS — R079 Chest pain, unspecified: Secondary | ICD-10-CM | POA: Diagnosis present

## 2017-07-21 DIAGNOSIS — Z7982 Long term (current) use of aspirin: Secondary | ICD-10-CM | POA: Insufficient documentation

## 2017-07-21 LAB — BASIC METABOLIC PANEL
Anion gap: 8 (ref 5–15)
BUN: <5 mg/dL — ABNORMAL LOW (ref 6–20)
CO2: 29 mmol/L (ref 22–32)
CREATININE: 1.41 mg/dL — AB (ref 0.61–1.24)
Calcium: 9.5 mg/dL (ref 8.9–10.3)
Chloride: 106 mmol/L (ref 101–111)
GFR calc Af Amer: >60 mL/min (ref >60)
GFR calc non Af Amer: 55 mL/min — ABNORMAL LOW (ref >60)
GLUCOSE: 102 mg/dL — AB (ref 65–99)
Potassium: 4.1 mmol/L (ref 3.5–5.1)
Sodium: 143 mmol/L (ref 135–145)

## 2017-07-21 LAB — CBC
HCT: 39.4 % (ref 39.0–52.0)
Hemoglobin: 12.9 g/dL — ABNORMAL LOW (ref 13.0–17.0)
MCH: 29.1 pg (ref 26.0–34.0)
MCHC: 32.7 g/dL (ref 30.0–36.0)
MCV: 88.7 fL (ref 78.0–100.0)
Platelets: 263 10*3/uL (ref 150–400)
RBC: 4.44 MIL/uL (ref 4.22–5.81)
RDW: 13.6 % (ref 11.5–15.5)
WBC: 10.2 10*3/uL (ref 4.0–10.5)

## 2017-07-21 LAB — I-STAT TROPONIN, ED: TROPONIN I, POC: 0.03 ng/mL (ref 0.00–0.08)

## 2017-07-21 LAB — D-DIMER, QUANTITATIVE: D-Dimer, Quant: <0.27 ug/mL-FEU (ref 0.00–0.50)

## 2017-07-21 MED ORDER — IOPAMIDOL (ISOVUE-370) INJECTION 76%
100.0000 mL | Freq: Once | INTRAVENOUS | Status: AC | PRN
  Administered 2017-07-21: 100 mL via INTRAVENOUS

## 2017-07-21 MED ORDER — GI COCKTAIL ~~LOC~~: 30.0000 mL | Freq: Once | ORAL | Status: DC

## 2017-07-21 MED ORDER — GI COCKTAIL ~~LOC~~
30.0000 mL | Freq: Once | ORAL | Status: DC
  Filled 2017-07-21: qty 30

## 2017-07-21 MED ORDER — IOPAMIDOL (ISOVUE-370) INJECTION 76%
INTRAVENOUS | Status: AC
  Filled 2017-07-21: qty 100

## 2017-07-21 NOTE — ED Notes (Signed)
Transported to xray via stretcher per rad tech

## 2017-07-21 NOTE — ED Notes (Signed)
Bag lunch given with soda

## 2017-07-21 NOTE — ED Provider Notes (Addendum)
53 year old male presents with chest pain. CTA of chest is pending at shift change. Symptoms feel the same as to when he had a PE. D-dimer is negative.  CTA is negative. Discussed with patient. Advised try OTC antacids.   7:26 PM Called back into room b/c pt does not want to be discharged. He states that 2 people close to him have died recently and he is afraid because he lives at home by himself and thinks he will die at home. His chest pain sounds atypical. It is coming and going. It is substernal and in the epigastric area. I advised we can give a GI cocktail and get a second troponin and reassess. He was not willing to try this or wait for this and states he wants to go to another hospital. His HEART score is low - at most a 2. With his pain so atypical sounding and his work up being overall reassuring, I do not think chest pain observation is indicated. His behavior is unreasonable and he is unwilling to try any options I suggest.     Bethel Born, PA-C 07/21/17 2011    Arby Barrette, MD 07/22/17 1438

## 2017-07-21 NOTE — ED Notes (Signed)
In to d/c pt - states would like to speak with MD as he has multiple questions in reference to his diagnosis

## 2017-07-21 NOTE — ED Triage Notes (Signed)
Arrived via EMS with complaints of central chest pain and rgt posterior calf pain - h/o PE - states pain in chest and RLE feel same as with previous PE

## 2017-07-21 NOTE — ED Provider Notes (Signed)
[facility] EMERGENCY DEPARTMENT Provider Note   CSN: [id number] Arrival date & time: 07/21/17  1346     History   Chief Complaint Chief Complaint  Patient presents with  . Chest Pain    HPI Justin [name] is a 52 y.o. male with history of OSA, subdural hematoma, PE, bipolar 1 disorder presents for evaluation of acute onset, constant substernal chest pain and right posterior calf pain since yesterday.  He states that pain began yesterday evening and has progressively worsened.   pain in the chest is substernal, pressure and stabbing pain which will radiate bilaterally.  He is unsure what aggravates or alleviates his symptoms but did say that his pain worsened with bending forward earlier today.  He notes associated shortness of breath and nausea but no vomiting.  He denies lightheadedness, diaphoresis, fevers or chills.  He notes a very mild dull ache to the posterior right calf.  He denies lower extremity edema.  He states his symptoms are almost identical to the last time that he had an unprovoked PE, which he initially thought was indigestion.  This occurred several years ago and he was taken off of warfarin for anticoagulation for 5 years ago.  He is not currently on any blood thinners.  He denies any recent travel or surgeries, no hemoptysis, and he is not on testosterone hormone replacement therapy.  He is a non-smoker.   The history is provided by the patient.    Past Medical History:  Diagnosis Date  . Achilles rupture, left   . Bipolar 1 disorder (HCC)   . Dental caries    periodontitis  . Pneumonia   . Pulmonary embolism (HCC)   . Sleep apnea    wears CPAP  . Subdural hematoma (HCC) 2015    Patient Active Problem List   Diagnosis Date Noted  . Spondylolisthesis, lumbar region 06/25/2017  . Radicular pain of right lower extremity 06/25/2017  . Achilles tendinitis of left lower extremity 12/07/2016  . Achilles tendinitis, left leg 05/17/2016  .  Obstructive sleep apnea on CPAP 03/07/2015  . Post-operative state 03/07/2015  . Achilles rupture, left 10/21/2014  . OSA (obstructive sleep apnea) 04/23/2013  . Bipolar disorder, unspecified (HCC) 01/07/2013  . Sinus tachycardia 01/07/2013  . Headache(784.0) 01/07/2013  . SDH (subdural hematoma) (HCC) 01/04/2013  . H/O PE 01/04/2013  . Chronic anticoagulation 01/04/2013  . 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  . Acute sinusitis 01/04/2013    Past Surgical History:  Procedure Laterality Date  . ACHILLES TENDON SURGERY Left 10/21/2014   Procedure: Left Achilles Reconstruction;  Surgeon: Nadara Mustard, MD;  Location: [facility] OR;  Service: Orthopedics;  Laterality: Left;  . APPENDECTOMY    . CRANIOTOMY N/A 01/19/2013   Procedure: CRANIOTOMY HEMATOMA EVACUATION SUBDURAL;  Surgeon: Hewitt Shorts, MD;  Location: [facility] NEURO ORS;  Service: Neurosurgery;  Laterality: N/A;  . CYSTECTOMY     right head  . ELBOW SURGERY     right  . FRACTURE SURGERY     finger  . I&D EXTREMITY Left 12/07/2016   Procedure: LEFT ACHILLES DEBRIDEMENT;  Surgeon: Nadara Mustard, MD;  Location: [facility] OR;  Service: Orthopedics;  Laterality: Left;  Marland Kitchen MULTIPLE EXTRACTIONS WITH ALVEOLOPLASTY N/A 03/07/2015   Procedure: MULTIPLE EXTRACTION WITH ALVEOLOPLASTY;  Surgeon: [name], DDS;  Location: [facility] OR;  Service: Oral Surgery;  Laterality: N/A;        Home Medications    Prior to Admission medications   Medication Sig Start  Date End Date Taking? Authorizing Provider  doxepin (SINEQUAN) 10 MG capsule Take 10-20 mg by mouth at bedtime. 07/15/17  Yes [provider]  lithium 600 MG capsule Take 600 mg by mouth at bedtime.   Yes [provider]  mirtazapine (REMERON) 45 MG tablet Take 45 mg by mouth at bedtime.   Yes [provider]  QUEtiapine (SEROQUEL XR) 400 MG 24 hr tablet Take 800 mg by mouth at bedtime.   Yes [provider]  aspirin EC 81 MG tablet Take 1 tablet (81 mg  total) by mouth daily. 07/22/17   Joy, Shawn C, PA-C  meloxicam (MOBIC) 15 MG tablet Take 1 tablet (15 mg total) by mouth daily. TAKE WITH MEALS Patient not taking: Reported on 07/21/2017 01/09/17   Street, Du[place], PA-C  methocarbamol (ROBAXIN) 500 MG tablet Take 1 tablet (500 mg total) by mouth 2 (two) times daily. Patient not taking: Reported on 07/21/2017 04/26/17   Adonis Huguenin, NP  Olopatadine HCl 0.2 % SOLN Apply 1 drop to eye daily. Patient not taking: Reported on 07/21/2017 03/23/17   Elisha Ponder, PA-C  oxyCODONE-acetaminophen (PERCOCET/ROXICET) 5-325 MG tablet Take 1 tablet by mouth every 8 (eight) hours as needed for severe pain. Patient not taking: Reported on 07/21/2017 07/16/17   Nadara Mustard, MD  predniSONE (DELTASONE) 10 MG tablet Take 2 tablets (20 mg total) by mouth daily with breakfast. Patient not taking: Reported on 07/21/2017 04/04/17   Nadara Mustard, MD    Family History Family History  Problem Relation Age of Onset  . Hypertension Mother   . Stroke Mother   . Multiple sclerosis Sister   . Down syndrome Son     Social History Social History   Tobacco Use  . Smoking status: Never Smoker  . Smokeless tobacco: Never Used  Substance Use Topics  . Alcohol use: No  . Drug use: No     Allergies   Patient has no known allergies.   Review of Systems Review of Systems  Constitutional: Negative for chills and fever.  Respiratory: Positive for shortness of breath. Negative for cough.   Cardiovascular: Positive for chest pain. Negative for leg swelling.  Gastrointestinal: Positive for nausea. Negative for abdominal pain, constipation, diarrhea and vomiting.  All other systems reviewed and are negative.    Physical Exam Updated Vital Signs BP 127/90   Pulse 73   Temp 98.5 F (36.9 C) (Oral)   Resp 17   Ht 5\' 9"  (1.753 m)   Wt 104.3 kg (230 lb)   SpO2 98%   BMI 33.97 kg/m   Physical Exam  Constitutional: He appears well-developed and well-nourished. No  distress.  HENT:  Head: Normocephalic and atraumatic.  Eyes: Conjunctivae are normal. Right eye exhibits no discharge. Left eye exhibits no discharge.  Neck: Normal range of motion. Neck supple. No JVD present. No tracheal deviation present.  Cardiovascular: Normal rate and regular rhythm.  Pulses:      Carotid pulses are 2+ on the right side, and 2+ on the left side.      Radial pulses are 2+ on the right side, and 2+ on the left side.       Dorsalis pedis pulses are 2+ on the right side, and 2+ on the left side.       Posterior tibial pulses are 2+ on the right side, and 2+ on the left side.  Pulmonary/Chest: Effort normal and breath sounds normal. No accessory muscle usage or stridor.  No tachypnea. No respiratory distress.  Resting comfortably in bed, speaking in full sentences without difficulty.  Abdominal: Soft. Bowel sounds are normal. He exhibits no distension. There is no tenderness.  Musculoskeletal: He exhibits no edema.       Right lower leg: Normal. He exhibits no tenderness and no edema.       Left lower leg: Normal. He exhibits no tenderness and no edema.  Neurological: He is alert.  Skin: No erythema.  Psychiatric: He has a normal mood and affect. His behavior is normal.  Nursing note and vitals reviewed.    ED Treatments / Results  Labs (all labs ordered are listed, but only abnormal results are displayed) Labs Reviewed  BASIC METABOLIC PANEL - Abnormal; Notable for the following components:      Result Value   Glucose, Bld 102 (*)    BUN <5 (*)    Creatinine, Ser 1.41 (*)    GFR calc non Af Amer 55 (*)    All other components within normal limits  CBC - Abnormal; Notable for the following components:   Hemoglobin 12.9 (*)    All other components within normal limits  D-DIMER, QUANTITATIVE (NOT AT [facility])  I-STAT TROPONIN, ED    EKG EKG Interpretation  Date/Time:  Sunday Jul 21 2017 14:08:04 EDT Ventricular Rate:  80 PR Interval:  166 QRS  Duration: 90 QT Interval:  434 QTC Calculation: 500 R Axis:   -16 Text Interpretation:  Normal sinus rhythm Nonspecific T wave abnormality Prolonged QT Abnormal ECG no change from prior 10/17 Confirmed by Meridee Score [phone]) on 07/21/2017 2:53:09 PM Also confirmed by Meridee Score [phone]), editor Evangeline Dakin [phone])  on 07/21/2017 3:44:43 PM   Radiology Dg Chest 2 View  Result Date: 07/21/2017 CLINICAL DATA:  52 year old male with history of central chest pain. EXAM: CHEST - 2 VIEW COMPARISON:  Chest x-ray 12/07/2016. FINDINGS: Ill-defined opacities in the lingula, similar to prior examinations, favored to reflect an area of scarring. No definite acute consolidative airspace disease. No pleural effusions. No evidence of pulmonary edema. Mild cardiomegaly. Upper mediastinal contours are within normal limits. IMPRESSION: 1. No radiographic evidence of acute cardiopulmonary disease. 2. Mild cardiomegaly. 3. Area of persistent architectural distortion in the lingula, similar to numerous prior examinations, favored to reflect scarring. The possibility of a slow-growing pulmonary nodule is not entirely excluded, but is not favored. This could be better evaluated with nonemergent noncontrast chest CT if of clinical concern. Electronically Signed   By: Trudie Reed M.D.   On: 07/21/2017 15:01   Ct Angio Chest Pe W And/or Wo Contrast  Result Date: 07/21/2017 CLINICAL DATA:  Chest pain. EXAM: CT ANGIOGRAPHY CHEST WITH CONTRAST TECHNIQUE: Multidetector CT imaging of the chest was performed using the standard protocol during bolus administration of intravenous contrast. Multiplanar CT image reconstructions and MIPs were obtained to evaluate the vascular anatomy. CONTRAST:  ISOVUE-370 IOPAMIDOL (ISOVUE-370) INJECTION 76% COMPARISON:  Radiographs of same day. FINDINGS: Cardiovascular: Satisfactory opacification of the pulmonary arteries to the segmental level. No evidence of pulmonary embolism. Mild  cardiomegaly is noted. No pericardial effusion. Mediastinum/Nodes: No enlarged mediastinal, hilar, or axillary lymph nodes. Thyroid gland, trachea, and esophagus demonstrate no significant findings. Lungs/Pleura: No pneumothorax or pleural effusion is noted. Right lung is unremarkable. Minimal subsegmental atelectasis is noted in lingular segment of left upper lobe. Upper Abdomen: No acute abnormality. Musculoskeletal: No chest wall abnormality. No acute or significant osseous findings. Review of the MIP images confirms the above findings.  IMPRESSION: No definite evidence of pulmonary embolus. Minimal subsegmental atelectasis is noted in lingular segment of left upper lobe. Electronically Signed   By: Lupita Raider, M.D.   On: 07/21/2017 17:08    Procedures Procedures (including critical care time)  Medications Ordered in ED Medications  iopamidol (ISOVUE-370) 76 % injection 100 mL (100 mLs Intravenous Contrast Given 07/21/17 1626)     Initial Impression / Assessment and Plan / ED Course  I have reviewed the triage vital signs and the nursing notes.  Pertinent labs & imaging results that were available during my care of the patient were reviewed by me and considered in my medical decision making (see chart for details).     Patient with history of prior PE several years ago presents for evaluation of acute onset, progressively worsening chest pain with associated shortness of breath.  He is afebrile, vital signs are stable in the ED.  He is nontoxic in appearance.  He exhibits no increased work of breathing.  He is not tachycardic or hypoxic.  However, with history of unprovoked PE in the past and with his endorsement that this pain feels almost identical to when he presented with a PE, we will obtain CTA of the chest to rule out acute PE in this setting.  His EKG shows no acute changes from prior tracing, chest x-ray shows no acute cardiopulmonary abnormalities, but does show mild cardiomegaly  and an area of persistent scarring of the lingula.  Lab work reviewed by me significant for mildly elevated creatinine, no leukocytosis.  4:30PM  Awaiting results of CTA and second troponin.  If both of these are negative, patient is likely stable for discharge home with follow-up with primary care physician or cardiologist on an outpatient basis.  He is low risk for cardiac disease given he has a HEART score of 2.  If he does have evidence of PE he will require admission anticoagulation, and if he has elevated troponin he will require admission for chest pain work-up.  Signed out to oncoming PA G[place]. Final Clinical Impressions(s) / ED Diagnoses   Final diagnoses:  Indigestion    ED Discharge Orders    None       Jeanie Sewer, PA-C 07/23/17 1001    Terrilee Files, MD 07/23/17 1705

## 2017-07-21 NOTE — ED Notes (Signed)
RN took over patient at shift change and was informed that he wanted to speak to the provider. RN let provider know and she spoke with patient. RN went into to administer order medications and check on the patient. Patient was angry and short with RN. RN tried to explain what the medical staff was doing. He stated "unhook me! I am going to go to another hospital." Provider notified and patient was prepared for discharge. NAD noted at that time

## 2017-07-22 ENCOUNTER — Encounter (HOSPITAL_COMMUNITY): Payer: Self-pay | Admitting: *Deleted

## 2017-07-22 ENCOUNTER — Emergency Department (HOSPITAL_COMMUNITY)
Admission: EM | Admit: 2017-07-22 | Discharge: 2017-07-22 | Disposition: A | Discharge status: Home / Self Care | Payer: Medicaid Other | Attending: Emergency Medicine | Admitting: Emergency Medicine

## 2017-07-22 DIAGNOSIS — R0789 Other chest pain: Secondary | ICD-10-CM | POA: Diagnosis not present

## 2017-07-22 DIAGNOSIS — Z79899 Other long term (current) drug therapy: Secondary | ICD-10-CM | POA: Diagnosis not present

## 2017-07-22 LAB — I-STAT TROPONIN, ED: TROPONIN I, POC: 0.01 ng/mL (ref 0.00–0.08)

## 2017-07-22 MED ORDER — ACETAMINOPHEN 325 MG PO TABS
650.0000 mg | ORAL_TABLET | Freq: Once | ORAL | Status: AC
  Administered 2017-07-22: 650 mg via ORAL
  Filled 2017-07-22: qty 2

## 2017-07-22 MED ORDER — ASPIRIN EC 81 MG PO TBEC: 81.0000 mg | DELAYED_RELEASE_TABLET | Freq: Every day | ORAL | 0 refills | Status: DC

## 2017-07-22 NOTE — Discharge Instructions (Addendum)
There were no abnormalities with trended cardiac enzymes.  There is no evidence of blood clot on the CT scan of the chest.  Please follow-up with the cardiologist for further evaluation.  Begin taking a daily aspirin.  Return to the ED for worsening symptoms.  You have what is known as a borderline prolonged QT interval on the EKG.  This is not causing your pain today.  This is something that just needs to be noted and can restrict many medications that you are allowed to take.  Have this followed up upon by a primary care provider or cardiology.

## 2017-07-22 NOTE — ED Triage Notes (Signed)
Pt brought in by [facility] after being d/c'd from [facility] after complete cardiac work up.

## 2017-07-22 NOTE — ED Provider Notes (Signed)
[facility]-EMERGENCY DEPT Provider Note   CSN: [id number] Arrival date & time: 07/22/17  0024     History   Chief Complaint Chief Complaint  Patient presents with  . Chest Pain    HPI Justin [name] is a 52 y.o. male.  HPI   Justin [name] is a 52 y.o. male, with a history of bipolar and PE (more than 5 years ago), presenting to the ED with chest pain beginning 5/4 and resolving evening of 5/5. Pain is central chest, sharp, 9/10, nonradiating.  Has not taken any medications for the pain. Denies anticoagulation.  Evaluated in the [facility] ED just prior to his presentation to [facility] ED. States he does not understand the work-up that was performed at [facility] and wanted a second opinion. Denies fever/chills, N/V/D, abdominal pain, cough, shortness of breath, dizziness, diaphoresis, or any other complaints.    Past Medical History:  Diagnosis Date  . Achilles rupture, left   . Bipolar 1 disorder (HCC)   . Dental caries    periodontitis  . Pneumonia   . Pulmonary embolism (HCC)   . Sleep apnea    wears CPAP  . Subdural hematoma (HCC) 2015    Patient Active Problem List   Diagnosis Date Noted  . Spondylolisthesis, lumbar region 06/25/2017  . Radicular pain of right lower extremity 06/25/2017  . Achilles tendinitis of left lower extremity 12/07/2016  . Achilles tendinitis, left leg 05/17/2016  . Obstructive sleep apnea on CPAP 03/07/2015  . Post-operative state 03/07/2015  . Achilles rupture, left 10/21/2014  . OSA (obstructive sleep apnea) 04/23/2013  . Bipolar disorder, unspecified (HCC) 01/07/2013  . Sinus tachycardia 01/07/2013  . Headache(784.0) 01/07/2013  . SDH (subdural hematoma) (HCC) 01/04/2013  . H/O PE 01/04/2013  . Chronic anticoagulation 01/04/2013  . 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  . Acute sinusitis 01/04/2013    Past Surgical History:  Procedure Laterality Date  . ACHILLES TENDON SURGERY Left 10/21/2014     Procedure: Left Achilles Reconstruction;  Surgeon: Nadara Mustard, MD;  Location: [facility] OR;  Service: Orthopedics;  Laterality: Left;  . APPENDECTOMY    . CRANIOTOMY N/A 01/19/2013   Procedure: CRANIOTOMY HEMATOMA EVACUATION SUBDURAL;  Surgeon: Hewitt Shorts, MD;  Location: [facility];  Service: Neurosurgery;  Laterality: N/A;  . CYSTECTOMY     right head  . ELBOW SURGERY     right  . FRACTURE SURGERY     finger  . I&D EXTREMITY Left 12/07/2016   Procedure: LEFT ACHILLES DEBRIDEMENT;  Surgeon: Nadara Mustard, MD;  Location: [facility] OR;  Service: Orthopedics;  Laterality: Left;  Marland Kitchen MULTIPLE EXTRACTIONS WITH ALVEOLOPLASTY N/A 03/07/2015   Procedure: MULTIPLE EXTRACTION WITH ALVEOLOPLASTY;  Surgeon: [name], DDS;  Location: [facility] OR;  Service: Oral Surgery;  Laterality: N/A;        Home Medications    Prior to Admission medications   Medication Sig Start Date End Date Taking? Authorizing Provider  doxepin (SINEQUAN) 10 MG capsule Take 10-20 mg by mouth at bedtime. 07/15/17  Yes [provider]  lithium 600 MG capsule Take 600 mg by mouth at bedtime.   Yes [provider]  mirtazapine (REMERON) 45 MG tablet Take 45 mg by mouth at bedtime.   Yes [provider]  QUEtiapine (SEROQUEL XR) 400 MG 24 hr tablet Take 800 mg by mouth at bedtime.   Yes [provider]  aspirin EC 81 MG tablet Take 1 tablet (81 mg total)  by mouth daily. 07/22/17   Trevione Wert C, PA-C  meloxicam (MOBIC) 15 MG tablet Take 1 tablet (15 mg total) by mouth daily. TAKE WITH MEALS Patient not taking: Reported on 07/21/2017 01/09/17   Street, [place], PA-C  methocarbamol (ROBAXIN) 500 MG tablet Take 1 tablet (500 mg total) by mouth 2 (two) times daily. Patient not taking: Reported on 07/21/2017 04/26/17   Adonis Huguenin, NP  Olopatadine HCl 0.2 % SOLN Apply 1 drop to eye daily. Patient not taking: Reported on 07/21/2017 03/23/17   Elisha Ponder, PA-C  oxyCODONE-acetaminophen (PERCOCET/ROXICET)  5-325 MG tablet Take 1 tablet by mouth every 8 (eight) hours as needed for severe pain. Patient not taking: Reported on 07/21/2017 07/16/17   Nadara Mustard, MD  predniSONE (DELTASONE) 10 MG tablet Take 2 tablets (20 mg total) by mouth daily with breakfast. Patient not taking: Reported on 07/21/2017 04/04/17   Nadara Mustard, MD    Family History Family History  Problem Relation Age of Onset  . Hypertension Mother   . Stroke Mother   . Multiple sclerosis Sister   . Down syndrome Son     Social History Social History   Tobacco Use  . Smoking status: Never Smoker  . Smokeless tobacco: Never Used  Substance Use Topics  . Alcohol use: No  . Drug use: No     Allergies   Patient has no known allergies.   Review of Systems Review of Systems  Constitutional: Negative for chills, diaphoresis and fever.  Respiratory: Negative for cough and shortness of breath.   Cardiovascular: Positive for chest pain. Negative for leg swelling.  Gastrointestinal: Negative for abdominal pain, diarrhea, nausea and vomiting.  Musculoskeletal: Negative for back pain.  Neurological: Negative for weakness and numbness.  All other systems reviewed and are negative.    Physical Exam Updated Vital Signs BP 120/83 (BP Location: Left Arm)   Pulse (!) 105   Temp 98.4 F (36.9 C) (Oral)   Resp 18   SpO2 95%   Physical Exam  Constitutional: He appears well-developed and well-nourished. No distress.  HENT:  Head: Normocephalic and atraumatic.  Eyes: Conjunctivae are normal.  Neck: Neck supple.  Cardiovascular: Normal rate, regular rhythm, normal heart sounds and intact distal pulses.  Not tachycardic upon my exam.  Pulmonary/Chest: Effort normal and breath sounds normal. No respiratory distress.  No increased work of breathing. Speaks in full sentences without difficulty.   Abdominal: Soft. There is no tenderness. There is no guarding.  Musculoskeletal: He exhibits no edema.  Lymphadenopathy:    He  has no cervical adenopathy.  Neurological: He is alert.  Skin: Skin is warm and dry. Capillary refill takes less than 2 seconds. He is not diaphoretic.  Psychiatric: He has a normal mood and affect. His behavior is normal.  Nursing note and vitals reviewed.    ED Treatments / Results  Labs (all labs ordered are listed, but only abnormal results are displayed) Labs Reviewed  I-STAT TROPONIN, ED    Results for orders placed or performed during the hospital encounter of 07/22/17 (from the past 24 hour(s))  I-stat troponin, ED     Status: None   Collection Time: 07/22/17  2:43 AM  Result Value Ref Range   Troponin i, poc 0.01 0.00 - 0.08 ng/mL   Comment 3            EKG EKG Interpretation  Date/Time:  Monday Jul 22 2017 02:41:12 EDT Ventricular Rate:  87 PR Interval:  QRS Duration: 97 QT Interval:  410 QTC Calculation: 494 R Axis:   -17 Text Interpretation:  Sinus rhythm Borderline prolonged QT interval Confirmed by Palumbo, April ([id number]) on 07/22/2017 2:43:24 AM   Radiology Dg Chest 2 View  Result Date: 07/21/2017 CLINICAL DATA:  52 year old male with history of central chest pain. EXAM: CHEST - 2 VIEW COMPARISON:  Chest x-ray 12/07/2016. FINDINGS: Ill-defined opacities in the lingula, similar to prior examinations, favored to reflect an area of scarring. No definite acute consolidative airspace disease. No pleural effusions. No evidence of pulmonary edema. Mild cardiomegaly. Upper mediastinal contours are within normal limits. IMPRESSION: 1. No radiographic evidence of acute cardiopulmonary disease. 2. Mild cardiomegaly. 3. Area of persistent architectural distortion in the lingula, similar to numerous prior examinations, favored to reflect scarring. The possibility of a slow-growing pulmonary nodule is not entirely excluded, but is not favored. This could be better evaluated with nonemergent noncontrast chest CT if of clinical concern. Electronically Signed   By: Trudie Reed  M.D.   On: 07/21/2017 15:01   Ct Angio Chest Pe W And/or Wo Contrast  Result Date: 07/21/2017 CLINICAL DATA:  Chest pain. EXAM: CT ANGIOGRAPHY CHEST WITH CONTRAST TECHNIQUE: Multidetector CT imaging of the chest was performed using the standard protocol during bolus administration of intravenous contrast. Multiplanar CT image reconstructions and MIPs were obtained to evaluate the vascular anatomy. CONTRAST:  ISOVUE-370 IOPAMIDOL (ISOVUE-370) INJECTION 76% COMPARISON:  Radiographs of same day. FINDINGS: Cardiovascular: Satisfactory opacification of the pulmonary arteries to the segmental level. No evidence of pulmonary embolism. Mild cardiomegaly is noted. No pericardial effusion. Mediastinum/Nodes: No enlarged mediastinal, hilar, or axillary lymph nodes. Thyroid gland, trachea, and esophagus demonstrate no significant findings. Lungs/Pleura: No pneumothorax or pleural effusion is noted. Right lung is unremarkable. Minimal subsegmental atelectasis is noted in lingular segment of left upper lobe. Upper Abdomen: No acute abnormality. Musculoskeletal: No chest wall abnormality. No acute or significant osseous findings. Review of the MIP images confirms the above findings. IMPRESSION: No definite evidence of pulmonary embolus. Minimal subsegmental atelectasis is noted in lingular segment of left upper lobe. Electronically Signed   By: Lupita Raider, M.D.   On: 07/21/2017 17:08    Procedures Procedures (including critical care time)  Medications Ordered in ED Medications  acetaminophen (TYLENOL) tablet 650 mg (650 mg Oral Given 07/22/17 0304)     Initial Impression / Assessment and Plan / ED Course  I have reviewed the triage vital signs and the nursing notes.  Pertinent labs & imaging results that were available during my care of the patient were reviewed by me and considered in my medical decision making (see chart for details).     Patient presents for evaluation of chest pain. Low  suspicion for ACS. No high risk/suspicious features; no exertional chest pain, vomiting, diaphoresis, or radiation. HEART score is 2, indicating low risk for a cardiac event. Wells criteria score is 3, indicating moderate risk for PE.  D-dimer negative and CTA of the chest negative for PE and dissection. Delta troponins negative.  Delta EKGs without evolving changes.  PCP versus cardiology follow-up.  The work-up and the purpose of each piece of the evaluation was discussed and explained to the patient.  Questions answered. The patient was given instructions for home care as well as return precautions. Patient voices understanding of these instructions, accepts the plan, and is comfortable with discharge.  Findings and plan of care discussed with April Palumbo, MD.   Vitals:   07/22/17 6045  07/22/17 0305  BP: 120/83 (!) 130/95  Pulse: (!) 105 86  Resp: 18 14  Temp: 98.4 F (36.9 C)   TempSrc: Oral   SpO2: 95% 95%     Final Clinical Impressions(s) / ED Diagnoses   Final diagnoses:  Atypical chest pain    ED Discharge Orders        Ordered    aspirin EC 81 MG tablet  Daily     07/22/17 0310       Anselm Pancoast, PA-C 07/22/17 0316    Palumbo, April, MD 07/22/17 0321

## 2017-07-22 NOTE — ED Triage Notes (Signed)
Pt stated "I'm not satisfied with what [facility] told me.  They just tried to give me something for my stomach & throat and didn't tell me why I was taking it.  I just want to know what's going on with my chest."

## 2017-07-22 NOTE — ED Notes (Signed)
Bed: WA05 Expected date:  Expected time:  Means of arrival:  Comments: 

## 2017-08-17 ENCOUNTER — Encounter (HOSPITAL_COMMUNITY): Payer: Self-pay

## 2017-08-17 ENCOUNTER — Emergency Department (HOSPITAL_COMMUNITY)
Admission: EM | Admit: 2017-08-17 | Discharge: 2017-08-17 | Disposition: A | Discharge status: Home / Self Care | Payer: Medicaid Other | Attending: Emergency Medicine | Admitting: Emergency Medicine

## 2017-08-17 ENCOUNTER — Other Ambulatory Visit: Payer: Self-pay

## 2017-08-17 ENCOUNTER — Emergency Department (HOSPITAL_COMMUNITY): Payer: Medicaid Other

## 2017-08-17 DIAGNOSIS — S199XXA Unspecified injury of neck, initial encounter: Secondary | ICD-10-CM | POA: Diagnosis present

## 2017-08-17 DIAGNOSIS — Z7982 Long term (current) use of aspirin: Secondary | ICD-10-CM | POA: Insufficient documentation

## 2017-08-17 DIAGNOSIS — Y999 Unspecified external cause status: Secondary | ICD-10-CM | POA: Diagnosis not present

## 2017-08-17 DIAGNOSIS — M62838 Other muscle spasm: Secondary | ICD-10-CM | POA: Diagnosis not present

## 2017-08-17 DIAGNOSIS — S40011A Contusion of right shoulder, initial encounter: Secondary | ICD-10-CM

## 2017-08-17 DIAGNOSIS — Z79899 Other long term (current) drug therapy: Secondary | ICD-10-CM | POA: Insufficient documentation

## 2017-08-17 DIAGNOSIS — S161XXA Strain of muscle, fascia and tendon at neck level, initial encounter: Secondary | ICD-10-CM | POA: Diagnosis not present

## 2017-08-17 DIAGNOSIS — S060X0A Concussion without loss of consciousness, initial encounter: Secondary | ICD-10-CM | POA: Diagnosis not present

## 2017-08-17 DIAGNOSIS — Y9389 Activity, other specified: Secondary | ICD-10-CM | POA: Diagnosis not present

## 2017-08-17 DIAGNOSIS — Y92410 Unspecified street and highway as the place of occurrence of the external cause: Secondary | ICD-10-CM | POA: Diagnosis not present

## 2017-08-17 DIAGNOSIS — Z7901 Long term (current) use of anticoagulants: Secondary | ICD-10-CM | POA: Insufficient documentation

## 2017-08-17 DIAGNOSIS — M4802 Spinal stenosis, cervical region: Secondary | ICD-10-CM

## 2017-08-17 MED ORDER — NAPROXEN 500 MG PO TABS
500.0000 mg | ORAL_TABLET | Freq: Once | ORAL | Status: AC
Start: 2017-08-17 — End: 2017-08-17
  Administered 2017-08-17: 500 mg via ORAL
  Filled 2017-08-17: qty 1

## 2017-08-17 MED ORDER — CYCLOBENZAPRINE HCL 10 MG PO TABS
10.0000 mg | ORAL_TABLET | Freq: Once | ORAL | Status: AC
  Administered 2017-08-17: 10 mg via ORAL
  Filled 2017-08-17: qty 1

## 2017-08-17 MED ORDER — CYCLOBENZAPRINE HCL 10 MG PO TABS: 10.0000 mg | ORAL_TABLET | Freq: Three times a day (TID) | ORAL | 0 refills | Status: DC | PRN

## 2017-08-17 MED ORDER — NAPROXEN 500 MG PO TABS: 500.0000 mg | ORAL_TABLET | Freq: Two times a day (BID) | ORAL | 0 refills | Status: DC | PRN
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
## 2017-08-21 ENCOUNTER — Emergency Department (HOSPITAL_COMMUNITY): Payer: Medicaid Other

## 2017-08-21 ENCOUNTER — Emergency Department (HOSPITAL_COMMUNITY)
Admission: EM | Admit: 2017-08-21 | Discharge: 2017-08-21 | Disposition: A | Discharge status: Home / Self Care | Payer: Medicaid Other | Attending: Emergency Medicine | Admitting: Emergency Medicine

## 2017-08-21 ENCOUNTER — Encounter (HOSPITAL_COMMUNITY): Payer: Self-pay | Admitting: Emergency Medicine

## 2017-08-21 DIAGNOSIS — R0789 Other chest pain: Secondary | ICD-10-CM | POA: Insufficient documentation

## 2017-08-21 DIAGNOSIS — Z79899 Other long term (current) drug therapy: Secondary | ICD-10-CM | POA: Insufficient documentation

## 2017-08-21 DIAGNOSIS — Z041 Encounter for examination and observation following transport accident: Secondary | ICD-10-CM | POA: Diagnosis not present

## 2017-08-21 DIAGNOSIS — R51 Headache: Secondary | ICD-10-CM | POA: Diagnosis present

## 2017-08-21 DIAGNOSIS — Z7982 Long term (current) use of aspirin: Secondary | ICD-10-CM | POA: Diagnosis not present

## 2017-08-21 DIAGNOSIS — R519 Headache, unspecified: Secondary | ICD-10-CM

## 2017-08-21 DIAGNOSIS — R0781 Pleurodynia: Secondary | ICD-10-CM

## 2017-08-21 MED ORDER — ACETAMINOPHEN 500 MG PO TABS
1000.0000 mg | ORAL_TABLET | Freq: Once | ORAL | Status: AC
  Administered 2017-08-21: 1000 mg via ORAL
  Filled 2017-08-21: qty 2

## 2017-08-21 MED ORDER — ACETAMINOPHEN 500 MG PO TABS: 1000.0000 mg | ORAL_TABLET | Freq: Four times a day (QID) | ORAL | 0 refills | Status: DC | PRN

## 2017-08-21 MED ORDER — CYCLOBENZAPRINE HCL 10 MG PO TABS: 10.0000 mg | ORAL_TABLET | Freq: Three times a day (TID) | ORAL | 0 refills | Status: DC | PRN
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
## 2017-08-26 ENCOUNTER — Ambulatory Visit (INDEPENDENT_AMBULATORY_CARE_PROVIDER_SITE_OTHER): Payer: Medicaid Other | Admitting: Orthopedic Surgery

## 2017-09-02 ENCOUNTER — Ambulatory Visit (INDEPENDENT_AMBULATORY_CARE_PROVIDER_SITE_OTHER): Payer: Medicaid Other | Admitting: Orthopedic Surgery

## 2017-09-09 ENCOUNTER — Telehealth (INDEPENDENT_AMBULATORY_CARE_PROVIDER_SITE_OTHER): Payer: Self-pay | Admitting: Orthopedic Surgery

## 2017-09-09 NOTE — Telephone Encounter (Signed)
Patient called needing Rx refilled (Percocet) The number to contact patient is [phone]

## 2017-09-10 NOTE — Telephone Encounter (Signed)
Has appt sched for 09/18/17.

## 2017-09-10 NOTE — Telephone Encounter (Signed)
Please advise 

## 2017-09-18 ENCOUNTER — Ambulatory Visit (INDEPENDENT_AMBULATORY_CARE_PROVIDER_SITE_OTHER): Payer: Medicaid Other | Admitting: Family

## 2017-09-26 ENCOUNTER — Ambulatory Visit (INDEPENDENT_AMBULATORY_CARE_PROVIDER_SITE_OTHER): Payer: Self-pay

## 2017-09-26 ENCOUNTER — Encounter (INDEPENDENT_AMBULATORY_CARE_PROVIDER_SITE_OTHER): Payer: Self-pay | Admitting: Family

## 2017-09-26 ENCOUNTER — Ambulatory Visit (INDEPENDENT_AMBULATORY_CARE_PROVIDER_SITE_OTHER): Payer: Medicaid Other | Admitting: Family

## 2017-09-26 DIAGNOSIS — M5410 Radiculopathy, site unspecified: Secondary | ICD-10-CM | POA: Diagnosis not present

## 2017-09-26 DIAGNOSIS — M5416 Radiculopathy, lumbar region: Secondary | ICD-10-CM | POA: Diagnosis not present

## 2017-09-26 MED ORDER — PREDNISONE 50 MG PO TABS: ORAL_TABLET | ORAL | 0 refills | Status: DC

## 2017-09-26 MED ORDER — OXYCODONE-ACETAMINOPHEN 5-325 MG PO TABS: 1.0000 | ORAL_TABLET | Freq: Two times a day (BID) | ORAL | 0 refills | Status: DC | PRN
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
## 2017-10-08 ENCOUNTER — Encounter: Payer: Self-pay | Admitting: Neurology

## 2017-10-08 ENCOUNTER — Ambulatory Visit (INDEPENDENT_AMBULATORY_CARE_PROVIDER_SITE_OTHER): Payer: Medicaid Other | Admitting: Orthopaedic Surgery

## 2017-10-08 ENCOUNTER — Encounter (INDEPENDENT_AMBULATORY_CARE_PROVIDER_SITE_OTHER): Payer: Self-pay | Admitting: Orthopaedic Surgery

## 2017-10-08 VITALS — BP 127/82 | HR 90 | Ht 69.0 in | Wt 230.0 lb

## 2017-10-08 DIAGNOSIS — M5416 Radiculopathy, lumbar region: Secondary | ICD-10-CM | POA: Diagnosis not present

## 2017-10-08 DIAGNOSIS — M4316 Spondylolisthesis, lumbar region: Secondary | ICD-10-CM
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
## 2017-10-09 ENCOUNTER — Other Ambulatory Visit: Payer: Self-pay | Admitting: *Deleted

## 2017-10-09 DIAGNOSIS — M5416 Radiculopathy, lumbar region: Secondary | ICD-10-CM

## 2017-10-15 ENCOUNTER — Telehealth (INDEPENDENT_AMBULATORY_CARE_PROVIDER_SITE_OTHER): Payer: Self-pay | Admitting: Orthopedic Surgery

## 2017-10-15 NOTE — Telephone Encounter (Signed)
Please advise. Thanks.  

## 2017-10-15 NOTE — Telephone Encounter (Signed)
Sorry, as discussed not good to be on narcotic medication, get nerve tests done and we can discuss at [facility].

## 2017-10-15 NOTE — Telephone Encounter (Signed)
IC no answer LMVM advising per Dr Yates 

## 2017-10-15 NOTE — Telephone Encounter (Signed)
Patient called requesting an RX be called in for him for pain.  He says that he can not stand the pain and can barely walk.  He uses [facility] on [address]  CB#[phone].  Thank you.
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
## 2017-10-22 ENCOUNTER — Ambulatory Visit (INDEPENDENT_AMBULATORY_CARE_PROVIDER_SITE_OTHER): Payer: Medicaid Other | Admitting: Neurology

## 2017-10-22 ENCOUNTER — Telehealth (INDEPENDENT_AMBULATORY_CARE_PROVIDER_SITE_OTHER): Payer: Self-pay | Admitting: Orthopaedic Surgery

## 2017-10-22 DIAGNOSIS — M5416 Radiculopathy, lumbar region: Secondary | ICD-10-CM

## 2017-10-22 NOTE — Telephone Encounter (Signed)
I called and spoke with patient. I advised Dr. Ophelia Charter is out of the office until next week and he will not review the EMG/NCS until after he comes back. Patient was upset and states he was told we would be able to see his results in 1-2 days and that we told him we would give him medication after he had the test. He states that he can usually take pain but this pain is bad and he can hardly walk at all. I explained that Dr. Ophelia Charter is out of the office and that I could send a message to his PA-C to see if he can advise on medication until Dr. Ophelia Charter returns.  Please advise.

## 2017-10-22 NOTE — Telephone Encounter (Signed)
Patient requesting pain medication and also needs to know about the results of his nerve conduction study from today when possible. Patients # [phone]

## 2017-10-22 NOTE — Procedures (Signed)
[facility] Neurology  [address] [place], [address]  [place], [place] [address] Tel: [phone] Fax:  [phone] Test Date:  10/22/2017  Patient: Justin [name] DOB: November 19, 1965 Physician: Nita Sickle, DO  Sex: Male Height: 5\' 9"  Ref Phys: Annell Greening, MD  ID#: [id number] Temp: 35.0C Technician:    Patient Complaints: This is a 51 year old man referred for evaluation of bilateral chronic leg pain, worse on the right.  NCV & EMG Findings: Extensive electrodiagnostic testing of the right lower extremity and additional studies of the left shows:  1. Bilateral sural and superficial peroneal sensory responses are within normal limits. 2. Bilateral tibial motor responses show mildly reduced amplitude. Bilateral peroneal motor responses are within normal limits. 3. Bilateral tibial H reflex studies are within normal limits. 4. Sparse chronic motor axon loss changes are seen affecting the S1 myotomes bilaterally, without accompanied active denervation.  Impression: 1. Chronic S1 radiculopathy affecting bilateral lower extremities, mild in degree electrically. 2. There is no evidence of a sensorimotor polyneuropathy affecting the lower extremities.   ___________________________ Nita Sickle, DO    Nerve Conduction Studies Anti Sensory Summary Table   Site NR Peak (ms) Norm Peak (ms) P-T Amp (V) Norm P-T Amp  Left Sup Peroneal Anti Sensory (Ant Lat Mall)  35C  12 cm    2.3 <4.6 7.9 >4  Right Sup Peroneal Anti Sensory (Ant Lat Mall)  12 cm    3.1 <4.6 6.2 >4  Left Sural Anti Sensory (Lat Mall)  35C  Calf    3.3 <4.6 6.1 >4  Right Sural Anti Sensory (Lat Mall)  35C  Calf    3.4 <4.6 7.8 >4   Motor Summary Table   Site NR Onset (ms) Norm Onset (ms) O-P Amp (mV) Norm O-P Amp Site1 Site2 Delta-0 (ms) Dist (cm) Vel (m/s) Norm Vel (m/s)  Left Peroneal Motor (Ext Dig Brev)  35C  Ankle    3.2 <6.0 5.6 >2.5 B Fib Ankle 8.4 37.0 44 >40  B Fib    11.6  5.2  Poplt B Fib 1.4 8.0 57 >40    Poplt    13.0  5.2         Right Peroneal Motor (Ext Dig Brev)  Ankle    3.1 <6.0 6.2 >2.5 B Fib Ankle 8.0 40.0 50 >40  B Fib    11.1  5.7  Poplt B Fib 1.3 9.0 69 >40  Poplt    12.4  5.5         Left Tibial Motor (Abd Hall Brev)  35C  Ankle    3.4 <6.0 2.2 >4 Knee Ankle 9.3 42.0 45 >40  Knee    12.7  2.6         Right Tibial Motor (Abd Hall Brev)  Ankle    5.8 <6.0 3.0 >4 Knee Ankle 7.2 42.0 58 >40  Knee    13.0  1.6          H Reflex Studies   NR H-Lat (ms) Lat Norm (ms) L-R H-Lat (ms)  Left Tibial (Gastroc)  35C     34.01 <35 0.00  Right Tibial (Gastroc)  35C     34.01 <35 0.00   EMG   Side Muscle Ins Act Fibs Psw Fasc Number Recrt Dur Dur. Amp Amp. Poly Poly. Comment  Right AntTibialis Nml Nml Nml Nml Nml Nml Nml Nml Nml Nml Nml Nml N/A  Right Gastroc Nml Nml Nml Nml 1- Rapid Some 1+ Some 1+ Nml Nml  N/A  Right Flex Dig Long Nml Nml Nml Nml Nml Nml Nml Nml Nml Nml Nml Nml N/A  Right RectFemoris Nml Nml Nml Nml Nml Nml Nml Nml Nml Nml Nml Nml N/A  Right GluteusMed Nml Nml Nml Nml Nml Nml Nml Nml Nml Nml Nml Nml N/A  Left BicepsFemS Nml Nml Nml Nml 1- Rapid Few 1+ Few 1+ Nml Nml N/A  Right BicepsFemS Nml Nml Nml Nml 1- Rapid Few 1+ Few 1+ Nml Nml N/A  Left AntTibialis Nml Nml Nml Nml Nml Nml Nml Nml Nml Nml Nml Nml N/A  Left Gastroc Nml Nml Nml Nml 1- Rapid Few 1+ Few 1+ Nml Nml N/A  Left Flex Dig Long Nml Nml Nml Nml Nml Nml Nml Nml Nml Nml Nml Nml N/A  Left RectFemoris Nml Nml Nml Nml Nml Nml Nml Nml Nml Nml Nml Nml N/A  Left GluteusMed Nml Nml Nml Nml Nml Nml Nml Nml Nml Nml Nml Nml N/A      Waveforms:

## 2017-10-23 NOTE — Telephone Encounter (Signed)
Patient called to check on the status of mediation refill,patient is in a lot of pain.

## 2017-10-23 NOTE — Telephone Encounter (Signed)
Message has been sent to Zonia Kief, PA-C for review. Awaiting response.

## 2017-10-24 MED ORDER — TRAMADOL HCL 50 MG PO TABS: 50.0000 mg | ORAL_TABLET | Freq: Four times a day (QID) | ORAL | 0 refills | Status: DC | PRN

## 2017-10-24 NOTE — Telephone Encounter (Signed)
Call in Ultram 50 mg 1 tab p.o. every 6 hours as needed for pain number 50 tablets no refills.  No Percocet

## 2017-10-24 NOTE — Addendum Note (Signed)
Addended by: Rogers Seeds on: 10/24/2017 04:52 PM   Modules accepted: Orders

## 2017-10-24 NOTE — Telephone Encounter (Signed)
I called to pharmacy. I called patient and advised. 

## 2017-11-11 ENCOUNTER — Ambulatory Visit (INDEPENDENT_AMBULATORY_CARE_PROVIDER_SITE_OTHER): Payer: Medicaid Other | Admitting: Orthopaedic Surgery

## 2017-11-11 ENCOUNTER — Encounter (INDEPENDENT_AMBULATORY_CARE_PROVIDER_SITE_OTHER): Payer: Self-pay | Admitting: Orthopaedic Surgery

## 2017-11-11 VITALS — BP 119/87 | HR 86 | Ht 69.0 in | Wt 228.0 lb

## 2017-11-11 DIAGNOSIS — M4316 Spondylolisthesis, lumbar region: Secondary | ICD-10-CM
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
## 2017-11-13 ENCOUNTER — Ambulatory Visit (INDEPENDENT_AMBULATORY_CARE_PROVIDER_SITE_OTHER): Payer: Medicaid Other | Admitting: Surgery

## 2017-11-13 ENCOUNTER — Ambulatory Visit (INDEPENDENT_AMBULATORY_CARE_PROVIDER_SITE_OTHER): Payer: Medicaid Other | Admitting: Orthopaedic Surgery

## 2017-11-20 ENCOUNTER — Telehealth (INDEPENDENT_AMBULATORY_CARE_PROVIDER_SITE_OTHER): Payer: Self-pay | Admitting: Orthopaedic Surgery

## 2017-11-20 ENCOUNTER — Ambulatory Visit (INDEPENDENT_AMBULATORY_CARE_PROVIDER_SITE_OTHER): Payer: Medicaid Other | Admitting: Orthopaedic Surgery

## 2017-11-20 NOTE — Telephone Encounter (Signed)
Please advise 
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
## 2017-11-21 NOTE — Telephone Encounter (Signed)
noted 

## 2017-12-03 ENCOUNTER — Telehealth (INDEPENDENT_AMBULATORY_CARE_PROVIDER_SITE_OTHER): Payer: Self-pay | Admitting: Orthopaedic Surgery

## 2017-12-03 NOTE — Telephone Encounter (Signed)
Please advise 

## 2017-12-03 NOTE — Telephone Encounter (Signed)
Patient called needing something for pain. Patient said he is in so much pain. Patient Janene Harvey Id he is waiting to set up for surgery. Patient said he is waiting on medicaid. The  [place] to contact patient is [phone]

## 2017-12-05 NOTE — Telephone Encounter (Signed)
Pt with stenosis, positive EMG NCV for radiculopathy. He is going to social service today, once we get pt release we can fax MRI report , my last note and EMG NCV which should get him in process for approval for Medicaid if he meets financials to get Medicaid to cover his surgery. Once they tell us should be OK then proceed with scheduling thanks [phone] cell

## 2017-12-12 ENCOUNTER — Encounter (INDEPENDENT_AMBULATORY_CARE_PROVIDER_SITE_OTHER): Payer: Self-pay | Admitting: Surgery

## 2017-12-12 ENCOUNTER — Ambulatory Visit (INDEPENDENT_AMBULATORY_CARE_PROVIDER_SITE_OTHER): Payer: Medicaid Other | Admitting: Surgery

## 2017-12-12 VITALS — BP 109/74 | HR 94 | Temp 97.6°F | Ht 69.0 in | Wt 228.0 lb

## 2017-12-12 DIAGNOSIS — M4316 Spondylolisthesis, lumbar region: Secondary | ICD-10-CM

## 2017-12-12 DIAGNOSIS — M79661 Pain in right lower leg: Secondary | ICD-10-CM

## 2017-12-12 NOTE — Progress Notes (Signed)
52 year old black male history of L5-S1 stenosis comes in for preop evaluation.  Continues to have ongoing symptoms unchanged from last visit.  Wanted to proceed with surgery as scheduled.  Today full history and physical performed.

## 2017-12-18 NOTE — Pre-Procedure Instructions (Addendum)
[name]  12/18/2017      [facility] [address], [place] - [address] [place] [address] Phone: [phone] Fax: [phone]  [facility] [phone] - [address], [place] - [address] AT [facility] [address] [place] [place] [address] Phone: [phone] Fax: [phone]    Your procedure is scheduled on December 23, 2017.  Report to [facility] Admitting at 1030 AM.  Call this number if you have problems the morning of surgery:  [phone]   Remember:  Do not eat or drink after midnight.    Take these medicines the morning of surgery with A SIP OF WATER  Hydroxyzine (atarax)-if needed   7 days prior to surgery STOP taking any meloxicam (mobic), Aspirin (unless otherwise instructed by your surgeon), Aleve, Naproxen, Ibuprofen, Motrin, Advil, Goody's, BC's, all herbal medications, fish oil, and all vitamins    Do not wear jewelry  Do not wear lotions, powders, or colognes, or deodorant.  Men may shave face and neck.  Do not bring valuables to the hospital.  [facility] is not responsible for any belongings or valuables.  Contacts, dentures or bridgework may not be worn into surgery.  Leave your suitcase in the car.  After surgery it may be brought to your room.  For patients admitted to the hospital, discharge time will be determined by your treatment team.  Patients discharged the day of surgery will not be allowed to drive home.   [facility]- Preparing For Surgery  Before surgery, you can play an important role. Because skin is not sterile, your skin needs to be as free of germs as possible. You can reduce the number of germs on your skin by washing with CHG (chlorahexidine gluconate) Soap before surgery.  CHG is an antiseptic cleaner which kills germs and bonds with the skin to continue killing germs even after washing.    Oral Hygiene is also important to  reduce your risk of infection.  Remember - BRUSH YOUR TEETH THE MORNING OF SURGERY WITH YOUR REGULAR TOOTHPASTE  Please do not use if you have an allergy to CHG or antibacterial soaps. If your skin becomes reddened/irritated stop using the CHG.  Do not shave (including legs and underarms) for at least 48 hours prior to first CHG shower. It is OK to shave your face.  Please follow these instructions carefully.   1. Shower the NIGHT BEFORE SURGERY and the MORNING OF SURGERY with CHG.   2. If you chose to wash your hair, wash your hair first as usual with your normal shampoo.  3. After you shampoo, rinse your hair and body thoroughly to remove the shampoo.  4. Use CHG as you would any other liquid soap. You can apply CHG directly to the skin and wash gently with a scrungie or a clean washcloth.   5. Apply the CHG Soap to your body ONLY FROM THE NECK DOWN.  Do not use on open wounds or open sores. Avoid contact with your eyes, ears, mouth and genitals (private parts). Wash Face and genitals (private parts)  with your normal soap.  6. Wash thoroughly, paying special attention to the area where your surgery will be performed.  7. Thoroughly rinse your body with warm water from the neck down.  8. DO NOT shower/wash with your normal soap after using and rinsing off the CHG Soap.  9. Pat yourself dry with a CLEAN TOWEL.  10. Wear CLEAN PAJAMAS to bed the night before surgery, wear comfortable clothes the morning of surgery  11. Place CLEAN SHEETS on your bed the night of your first shower and DO NOT SLEEP WITH PETS.  Day of Surgery:  Do not apply any deodorants/lotions.  Please wear clean clothes to the hospital/surgery center.   Remember to brush your teeth WITH YOUR REGULAR TOOTHPASTE.  Please read over the following fact sheets that you were given.

## 2017-12-18 NOTE — Progress Notes (Addendum)
PCP:  [facility]  Cardiologist: pt denies  EKG: 07/22/17 in [facility]  Stress test: pt denies  ECHO: 03/26/16 in [facility]  Cardiac Cath: pt denies  Chest x-ray: 08/21/17 in [facility]

## 2017-12-19 ENCOUNTER — Encounter (HOSPITAL_COMMUNITY)
Admission: RE | Admit: 2017-12-19 | Discharge: 2017-12-19 | Disposition: A | Discharge status: Home / Self Care | Payer: Medicaid Other | Source: Ambulatory Visit | Attending: Orthopaedic Surgery | Admitting: Orthopaedic Surgery

## 2017-12-19 ENCOUNTER — Other Ambulatory Visit: Payer: Self-pay

## 2017-12-19 ENCOUNTER — Encounter (HOSPITAL_COMMUNITY): Payer: Self-pay

## 2017-12-19 DIAGNOSIS — Z01818 Encounter for other preprocedural examination: Secondary | ICD-10-CM | POA: Diagnosis not present

## 2017-12-19 DIAGNOSIS — M4807 Spinal stenosis, lumbosacral region: Secondary | ICD-10-CM | POA: Insufficient documentation

## 2017-12-19 DIAGNOSIS — M47897 Other spondylosis, lumbosacral region: Secondary | ICD-10-CM | POA: Insufficient documentation

## 2017-12-19 LAB — SURGICAL PCR SCREEN
MRSA, PCR: NEGATIVE
Staphylococcus aureus: POSITIVE — AB

## 2017-12-19 LAB — CBC
HCT: 41.5 % (ref 39.0–52.0)
Hemoglobin: 13.4 g/dL (ref 13.0–17.0)
MCH: 28.2 pg (ref 26.0–34.0)
MCHC: 32.3 g/dL (ref 30.0–36.0)
MCV: 87.4 fL (ref 78.0–100.0)
PLATELETS: 287 10*3/uL (ref 150–400)
RBC: 4.75 MIL/uL (ref 4.22–5.81)
RDW: 14.1 % (ref 11.5–15.5)
WBC: 8 10*3/uL (ref 4.0–10.5)

## 2017-12-19 LAB — TYPE AND SCREEN
ABO/RH(D): A POS
ANTIBODY SCREEN: NEGATIVE

## 2017-12-19 LAB — COMPREHENSIVE METABOLIC PANEL
ALT: 16 U/L (ref 0–44)
AST: 17 U/L (ref 15–41)
Albumin: 4.2 g/dL (ref 3.5–5.0)
Alkaline Phosphatase: 79 U/L (ref 38–126)
Anion gap: 7 (ref 5–15)
BUN: 6 mg/dL (ref 6–20)
CHLORIDE: 109 mmol/L (ref 98–111)
CO2: 24 mmol/L (ref 22–32)
CREATININE: 1.23 mg/dL (ref 0.61–1.24)
Calcium: 9.3 mg/dL (ref 8.9–10.3)
GFR calc non Af Amer: >60 mL/min (ref >60)
Glucose, Bld: 110 mg/dL — ABNORMAL HIGH (ref 70–99)
POTASSIUM: 4 mmol/L (ref 3.5–5.1)
Sodium: 140 mmol/L (ref 135–145)
Total Bilirubin: 0.5 mg/dL (ref 0.3–1.2)
Total Protein: 7.2 g/dL (ref 6.5–8.1)

## 2017-12-19 LAB — URINALYSIS, ROUTINE W REFLEX MICROSCOPIC
BILIRUBIN URINE: NEGATIVE
Glucose, UA: NEGATIVE mg/dL
Hgb urine dipstick: NEGATIVE
Ketones, ur: NEGATIVE mg/dL
Leukocytes, UA: NEGATIVE
NITRITE: NEGATIVE
PROTEIN: NEGATIVE mg/dL
SPECIFIC GRAVITY, URINE: 1.009 (ref 1.005–1.030)
pH: 5 (ref 5.0–8.0)

## 2017-12-23 ENCOUNTER — Inpatient Hospital Stay (HOSPITAL_COMMUNITY): Payer: Medicaid Other | Admitting: Certified Registered Nurse Anesthetist

## 2017-12-23 ENCOUNTER — Inpatient Hospital Stay (HOSPITAL_COMMUNITY)
Admission: RE | Admit: 2017-12-23 | Discharge: 2017-12-27 | DRG: 455 | Disposition: A | Discharge status: Skilled Nursing Facility | Payer: Medicaid Other | Source: Ambulatory Visit | Attending: Orthopaedic Surgery | Admitting: Orthopaedic Surgery

## 2017-12-23 ENCOUNTER — Encounter (HOSPITAL_COMMUNITY): Payer: Self-pay | Admitting: Certified Registered Nurse Anesthetist

## 2017-12-23 ENCOUNTER — Inpatient Hospital Stay (HOSPITAL_COMMUNITY)
Admission: RE | Disposition: A | Discharge status: Skilled Nursing Facility | Payer: Self-pay | Source: Ambulatory Visit | Attending: Orthopaedic Surgery

## 2017-12-23 ENCOUNTER — Other Ambulatory Visit: Payer: Self-pay

## 2017-12-23 ENCOUNTER — Inpatient Hospital Stay (HOSPITAL_COMMUNITY): Payer: Medicaid Other

## 2017-12-23 DIAGNOSIS — M4807 Spinal stenosis, lumbosacral region: Secondary | ICD-10-CM | POA: Diagnosis present

## 2017-12-23 DIAGNOSIS — M5417 Radiculopathy, lumbosacral region: Secondary | ICD-10-CM | POA: Diagnosis present

## 2017-12-23 DIAGNOSIS — I739 Peripheral vascular disease, unspecified: Secondary | ICD-10-CM | POA: Diagnosis present

## 2017-12-23 DIAGNOSIS — Z981 Arthrodesis status: Secondary | ICD-10-CM

## 2017-12-23 DIAGNOSIS — M4317 Spondylolisthesis, lumbosacral region: Secondary | ICD-10-CM | POA: Diagnosis present

## 2017-12-23 DIAGNOSIS — E669 Obesity, unspecified: Secondary | ICD-10-CM | POA: Diagnosis present

## 2017-12-23 DIAGNOSIS — Z79899 Other long term (current) drug therapy: Secondary | ICD-10-CM

## 2017-12-23 DIAGNOSIS — Z6833 Body mass index (BMI) 33.0-33.9, adult: Secondary | ICD-10-CM | POA: Diagnosis not present

## 2017-12-23 DIAGNOSIS — M5418 Radiculopathy, sacral and sacrococcygeal region: Secondary | ICD-10-CM | POA: Diagnosis present

## 2017-12-23 DIAGNOSIS — Z86718 Personal history of other venous thrombosis and embolism: Secondary | ICD-10-CM | POA: Diagnosis not present

## 2017-12-23 DIAGNOSIS — F319 Bipolar disorder, unspecified: Secondary | ICD-10-CM | POA: Diagnosis present

## 2017-12-23 DIAGNOSIS — Z86711 Personal history of pulmonary embolism: Secondary | ICD-10-CM

## 2017-12-23 DIAGNOSIS — Z7982 Long term (current) use of aspirin: Secondary | ICD-10-CM

## 2017-12-23 DIAGNOSIS — M79609 Pain in unspecified limb: Secondary | ICD-10-CM | POA: Diagnosis not present

## 2017-12-23 DIAGNOSIS — G4730 Sleep apnea, unspecified: Secondary | ICD-10-CM | POA: Diagnosis present

## 2017-12-23 DIAGNOSIS — Z419 Encounter for procedure for purposes other than remedying health state, unspecified: Secondary | ICD-10-CM

## 2017-12-23 DIAGNOSIS — Z8701 Personal history of pneumonia (recurrent): Secondary | ICD-10-CM

## 2017-12-23 DIAGNOSIS — M48061 Spinal stenosis, lumbar region without neurogenic claudication: Secondary | ICD-10-CM | POA: Diagnosis present

## 2017-12-23 DIAGNOSIS — Z9989 Dependence on other enabling machines and devices: Secondary | ICD-10-CM

## 2017-12-23 DIAGNOSIS — Z791 Long term (current) use of non-steroidal anti-inflammatories (NSAID): Secondary | ICD-10-CM

## 2017-12-23 DIAGNOSIS — R5082 Postprocedural fever: Secondary | ICD-10-CM

## 2017-12-23 HISTORY — PX: LUMBAR FUSION: SHX111

## 2017-12-23 SURGERY — POSTERIOR LUMBAR FUSION 1 LEVEL
Anesthesia: General | Site: Spine Lumbar

## 2017-12-23 MED ORDER — PROPOFOL 10 MG/ML IV BOLUS
INTRAVENOUS | Status: AC
  Filled 2017-12-23: qty 20

## 2017-12-23 MED ORDER — LITHIUM CARBONATE 300 MG PO CAPS
600.0000 mg | ORAL_CAPSULE | Freq: Every day | ORAL | Status: DC
  Administered 2017-12-23 – 2017-12-26 (×4): 600 mg via ORAL
  Filled 2017-12-23 (×5): qty 2

## 2017-12-23 MED ORDER — FENTANYL CITRATE (PF) 100 MCG/2ML IJ SOLN
INTRAMUSCULAR | Status: DC | PRN
  Administered 2017-12-23 (×2): 100 ug via INTRAVENOUS
  Administered 2017-12-23 (×2): 50 ug via INTRAVENOUS

## 2017-12-23 MED ORDER — KETOROLAC TROMETHAMINE 30 MG/ML IJ SOLN: 30.0000 mg | Freq: Once | INTRAMUSCULAR | Status: DC | PRN

## 2017-12-23 MED ORDER — PHENYLEPHRINE 40 MCG/ML (10ML) SYRINGE FOR IV PUSH (FOR BLOOD PRESSURE SUPPORT)
PREFILLED_SYRINGE | INTRAVENOUS | Status: AC
  Filled 2017-12-23: qty 20

## 2017-12-23 MED ORDER — ALBUMIN HUMAN 5 % IV SOLN
INTRAVENOUS | Status: DC | PRN
  Administered 2017-12-23: 15:00:00 via INTRAVENOUS

## 2017-12-23 MED ORDER — BUPIVACAINE HCL (PF) 0.5 % IJ SOLN
INTRAMUSCULAR | Status: DC | PRN
  Administered 2017-12-23: 20 mL

## 2017-12-23 MED ORDER — ACETAMINOPHEN 650 MG RE SUPP: 650.0000 mg | RECTAL | Status: DC | PRN

## 2017-12-23 MED ORDER — HYDROMORPHONE HCL 1 MG/ML IJ SOLN: 1.0000 mg | INTRAMUSCULAR | Status: DC | PRN

## 2017-12-23 MED ORDER — FENTANYL CITRATE (PF) 250 MCG/5ML IJ SOLN
INTRAMUSCULAR | Status: AC
  Filled 2017-12-23: qty 5

## 2017-12-23 MED ORDER — 0.9 % SODIUM CHLORIDE (POUR BTL) OPTIME
TOPICAL | Status: DC | PRN
  Administered 2017-12-23: 1000 mL

## 2017-12-23 MED ORDER — MEPERIDINE HCL 50 MG/ML IJ SOLN: 6.2500 mg | INTRAMUSCULAR | Status: DC | PRN

## 2017-12-23 MED ORDER — MIDAZOLAM HCL 5 MG/5ML IJ SOLN
INTRAMUSCULAR | Status: DC | PRN
  Administered 2017-12-23: 2 mg via INTRAVENOUS

## 2017-12-23 MED ORDER — QUETIAPINE FUMARATE ER 400 MG PO TB24
800.0000 mg | ORAL_TABLET | Freq: Every day | ORAL | Status: DC
  Administered 2017-12-23 – 2017-12-26 (×4): 800 mg via ORAL
  Filled 2017-12-23 (×5): qty 2

## 2017-12-23 MED ORDER — MIRTAZAPINE 15 MG PO TABS
45.0000 mg | ORAL_TABLET | Freq: Every day | ORAL | Status: DC
  Administered 2017-12-23 – 2017-12-26 (×4): 45 mg via ORAL
  Filled 2017-12-23 (×4): qty 3

## 2017-12-23 MED ORDER — PROMETHAZINE HCL 25 MG/ML IJ SOLN: 6.2500 mg | INTRAMUSCULAR | Status: DC | PRN

## 2017-12-23 MED ORDER — BUPIVACAINE HCL (PF) 0.5 % IJ SOLN
INTRAMUSCULAR | Status: AC
  Filled 2017-12-23: qty 30

## 2017-12-23 MED ORDER — PHENYLEPHRINE HCL 10 MG/ML IJ SOLN
INTRAMUSCULAR | Status: DC | PRN
  Administered 2017-12-23 (×5): 80 ug via INTRAVENOUS

## 2017-12-23 MED ORDER — SODIUM CHLORIDE 0.9 % IV SOLN: 250.0000 mL | INTRAVENOUS | Status: DC

## 2017-12-23 MED ORDER — PROPOFOL 10 MG/ML IV BOLUS
INTRAVENOUS | Status: DC | PRN
  Administered 2017-12-23: 150 mg via INTRAVENOUS

## 2017-12-23 MED ORDER — LIDOCAINE HCL (CARDIAC) PF 100 MG/5ML IV SOSY
PREFILLED_SYRINGE | INTRAVENOUS | Status: DC | PRN
  Administered 2017-12-23: 100 mg via INTRAVENOUS

## 2017-12-23 MED ORDER — ROCURONIUM BROMIDE 100 MG/10ML IV SOLN
INTRAVENOUS | Status: DC | PRN
  Administered 2017-12-23 (×2): 50 mg via INTRAVENOUS
  Administered 2017-12-23: 20 mg via INTRAVENOUS

## 2017-12-23 MED ORDER — DOXEPIN HCL 10 MG PO CAPS
20.0000 mg | ORAL_CAPSULE | Freq: Every day | ORAL | Status: DC
  Administered 2017-12-23 – 2017-12-26 (×4): 20 mg via ORAL
  Filled 2017-12-23 (×5): qty 2

## 2017-12-23 MED ORDER — HYDROXYZINE HCL 25 MG PO TABS: 25.0000 mg | ORAL_TABLET | Freq: Three times a day (TID) | ORAL | Status: DC | PRN

## 2017-12-23 MED ORDER — THROMBIN (RECOMBINANT) 20000 UNITS EX SOLR
CUTANEOUS | Status: AC
  Filled 2017-12-23: qty 20000

## 2017-12-23 MED ORDER — PHENOL 1.4 % MT LIQD: 1.0000 | OROMUCOSAL | Status: DC | PRN

## 2017-12-23 MED ORDER — ONDANSETRON HCL 4 MG/2ML IJ SOLN
INTRAMUSCULAR | Status: DC | PRN
  Administered 2017-12-23: 4 mg via INTRAVENOUS

## 2017-12-23 MED ORDER — HYDROMORPHONE HCL 1 MG/ML IJ SOLN
0.2500 mg | INTRAMUSCULAR | Status: DC | PRN
  Administered 2017-12-23 (×2): 0.5 mg via INTRAVENOUS

## 2017-12-23 MED ORDER — ONDANSETRON HCL 4 MG PO TABS
4.0000 mg | ORAL_TABLET | Freq: Four times a day (QID) | ORAL | Status: DC | PRN
  Filled 2017-12-23: qty 1

## 2017-12-23 MED ORDER — SODIUM CHLORIDE 0.9% FLUSH
3.0000 mL | Freq: Two times a day (BID) | INTRAVENOUS | Status: DC
  Administered 2017-12-25 – 2017-12-27 (×4): 3 mL via INTRAVENOUS

## 2017-12-23 MED ORDER — POLYETHYLENE GLYCOL 3350 17 G PO PACK
17.0000 g | PACK | Freq: Every day | ORAL | Status: DC | PRN
  Administered 2017-12-26 – 2017-12-27 (×2): 17 g via ORAL
  Filled 2017-12-23 (×2): qty 1

## 2017-12-23 MED ORDER — SODIUM CHLORIDE 0.9 % IV SOLN
INTRAVENOUS | Status: DC | PRN
  Administered 2017-12-23: 40 ug/min via INTRAVENOUS

## 2017-12-23 MED ORDER — CEFAZOLIN SODIUM-DEXTROSE 2-4 GM/100ML-% IV SOLN
2.0000 g | Freq: Three times a day (TID) | INTRAVENOUS | Status: AC
  Administered 2017-12-23 – 2017-12-24 (×2): 2 g via INTRAVENOUS
  Filled 2017-12-23 (×2): qty 100

## 2017-12-23 MED ORDER — ONDANSETRON HCL 4 MG/2ML IJ SOLN
4.0000 mg | Freq: Four times a day (QID) | INTRAMUSCULAR | Status: DC | PRN
  Administered 2017-12-23: 4 mg via INTRAVENOUS
  Filled 2017-12-23: qty 2

## 2017-12-23 MED ORDER — MIDAZOLAM HCL 2 MG/2ML IJ SOLN
INTRAMUSCULAR | Status: AC
  Filled 2017-12-23: qty 2

## 2017-12-23 MED ORDER — METHOCARBAMOL 1000 MG/10ML IJ SOLN
500.0000 mg | Freq: Four times a day (QID) | INTRAVENOUS | Status: DC | PRN
  Filled 2017-12-23: qty 5

## 2017-12-23 MED ORDER — DOCUSATE SODIUM 100 MG PO CAPS
100.0000 mg | ORAL_CAPSULE | Freq: Two times a day (BID) | ORAL | Status: DC
  Administered 2017-12-23 – 2017-12-27 (×8): 100 mg via ORAL
  Filled 2017-12-23 (×8): qty 1

## 2017-12-23 MED ORDER — CHLORHEXIDINE GLUCONATE 4 % EX LIQD: 60.0000 mL | Freq: Once | CUTANEOUS | Status: DC

## 2017-12-23 MED ORDER — SUGAMMADEX SODIUM 200 MG/2ML IV SOLN
INTRAVENOUS | Status: DC | PRN
  Administered 2017-12-23: 400 mg via INTRAVENOUS

## 2017-12-23 MED ORDER — MENTHOL 3 MG MT LOZG: 1.0000 | LOZENGE | OROMUCOSAL | Status: DC | PRN

## 2017-12-23 MED ORDER — OXYCODONE HCL 5 MG PO TABS
5.0000 mg | ORAL_TABLET | ORAL | Status: DC | PRN
  Administered 2017-12-23 – 2017-12-24 (×2): 5 mg via ORAL
  Filled 2017-12-23 (×2): qty 1

## 2017-12-23 MED ORDER — SODIUM CHLORIDE 0.9% FLUSH: 3.0000 mL | INTRAVENOUS | Status: DC | PRN

## 2017-12-23 MED ORDER — METHOCARBAMOL 500 MG PO TABS
500.0000 mg | ORAL_TABLET | Freq: Four times a day (QID) | ORAL | Status: DC | PRN
  Administered 2017-12-24 – 2017-12-27 (×8): 500 mg via ORAL
  Filled 2017-12-23 (×8): qty 1

## 2017-12-23 MED ORDER — HYDROMORPHONE HCL 1 MG/ML IJ SOLN
INTRAMUSCULAR | Status: AC
  Filled 2017-12-23: qty 1

## 2017-12-23 MED ORDER — ONDANSETRON HCL 4 MG/2ML IJ SOLN
INTRAMUSCULAR | Status: AC
  Filled 2017-12-23: qty 2

## 2017-12-23 MED ORDER — DEXAMETHASONE SODIUM PHOSPHATE 10 MG/ML IJ SOLN
INTRAMUSCULAR | Status: AC
  Filled 2017-12-23: qty 1

## 2017-12-23 MED ORDER — HEMOSTATIC AGENTS (NO CHARGE) OPTIME
TOPICAL | Status: DC | PRN
  Administered 2017-12-23: 1 via TOPICAL

## 2017-12-23 MED ORDER — THROMBIN 20000 UNITS EX SOLR
CUTANEOUS | Status: DC | PRN
  Administered 2017-12-23: 20000 [IU] via TOPICAL

## 2017-12-23 MED ORDER — CEFAZOLIN SODIUM-DEXTROSE 2-4 GM/100ML-% IV SOLN
2.0000 g | INTRAVENOUS | Status: AC
  Administered 2017-12-23: 2 g via INTRAVENOUS
  Filled 2017-12-23: qty 100

## 2017-12-23 MED ORDER — LACTATED RINGERS IV SOLN
INTRAVENOUS | Status: DC
  Administered 2017-12-23 (×4): via INTRAVENOUS

## 2017-12-23 MED ORDER — ROCURONIUM BROMIDE 50 MG/5ML IV SOSY
PREFILLED_SYRINGE | INTRAVENOUS | Status: AC
  Filled 2017-12-23: qty 15

## 2017-12-23 MED ORDER — SODIUM CHLORIDE 0.9 % IV SOLN
INTRAVENOUS | Status: DC
  Administered 2017-12-23 – 2017-12-25 (×2): via INTRAVENOUS

## 2017-12-23 MED ORDER — ACETAMINOPHEN 325 MG PO TABS
650.0000 mg | ORAL_TABLET | ORAL | Status: DC | PRN
  Administered 2017-12-24 – 2017-12-27 (×7): 650 mg via ORAL
  Filled 2017-12-23 (×7): qty 2

## 2017-12-23 SURGICAL SUPPLY — 67 items
BLADE CLIPPER SURG (BLADE) ×2 IMPLANT
BUR ROUND FLUTED 4 SOFT TCH (BURR) ×2 IMPLANT
CABLE BIPOLOR RESECTION CORD (MISCELLANEOUS) ×2 IMPLANT
CANISTER SUCTION 1500CC (MISCELLANEOUS) ×2 IMPLANT
CAP SPINAL LOCKING TI (Cap) ×8 IMPLANT
COVER BACK TABLE 80X110 HD (DRAPES) ×2 IMPLANT
COVER MAYO STAND STRL (DRAPES) ×6 IMPLANT
COVER SURGICAL LIGHT HANDLE (MISCELLANEOUS) ×2 IMPLANT
COVER WAND RF STERILE (DRAPES) IMPLANT
DERMABOND ADVANCED (GAUZE/BANDAGES/DRESSINGS) ×1
DERMABOND ADVANCED .7 DNX12 (GAUZE/BANDAGES/DRESSINGS) ×1 IMPLANT
DRAPE C-ARM 42X72 X-RAY (DRAPES) ×2 IMPLANT
DRAPE LAPAROTOMY T 102X78X121 (DRAPES) ×2 IMPLANT
DRAPE MICROSCOPE LEICA (MISCELLANEOUS) ×4 IMPLANT
DRAPE SURG 17X23 STRL (DRAPES) ×8 IMPLANT
DRSG EMULSION OIL 3X3 NADH (GAUZE/BANDAGES/DRESSINGS) ×2 IMPLANT
DRSG MEPILEX BORDER 4X4 (GAUZE/BANDAGES/DRESSINGS) ×2 IMPLANT
DRSG MEPILEX BORDER 4X8 (GAUZE/BANDAGES/DRESSINGS) ×2 IMPLANT
DRSG PAD ABDOMINAL 8X10 ST (GAUZE/BANDAGES/DRESSINGS) ×4 IMPLANT
DURAPREP 26ML APPLICATOR (WOUND CARE) ×2 IMPLANT
ELECT BLADE 4.0 EZ CLEAN MEGAD (MISCELLANEOUS) ×2
ELECT CAUTERY BLADE 6.4 (BLADE) ×2 IMPLANT
ELECT REM PT RETURN 9FT ADLT (ELECTROSURGICAL) ×2
ELECTRODE BLDE 4.0 EZ CLN MEGD (MISCELLANEOUS) ×1 IMPLANT
ELECTRODE REM PT RTRN 9FT ADLT (ELECTROSURGICAL) ×1 IMPLANT
EVACUATOR 1/8 PVC DRAIN (DRAIN) ×2 IMPLANT
GLOVE BIOGEL PI IND STRL 7.0 (GLOVE) ×2 IMPLANT
GLOVE BIOGEL PI IND STRL 8 (GLOVE) ×2 IMPLANT
GLOVE BIOGEL PI INDICATOR 7.0 (GLOVE) ×2
GLOVE BIOGEL PI INDICATOR 8 (GLOVE) ×2
GLOVE ORTHO TXT STRL SZ7.5 (GLOVE) ×4 IMPLANT
GOWN STRL REUS W/ TWL LRG LVL3 (GOWN DISPOSABLE) ×1 IMPLANT
GOWN STRL REUS W/ TWL XL LVL3 (GOWN DISPOSABLE) ×1 IMPLANT
GOWN STRL REUS W/TWL 2XL LVL3 (GOWN DISPOSABLE) ×2 IMPLANT
GOWN STRL REUS W/TWL LRG LVL3 (GOWN DISPOSABLE) ×1
GOWN STRL REUS W/TWL XL LVL3 (GOWN DISPOSABLE) ×1
HEMOSTAT SURGICEL 2X14 (HEMOSTASIS) IMPLANT
KIT BASIN OR (CUSTOM PROCEDURE TRAY) ×2 IMPLANT
KIT POSITION SURG JACKSON T1 (MISCELLANEOUS) IMPLANT
KIT TURNOVER KIT B (KITS) ×2 IMPLANT
NS IRRIG 1000ML POUR BTL (IV SOLUTION) ×2 IMPLANT
PACK LAMINECTOMY ORTHO (CUSTOM PROCEDURE TRAY) ×2 IMPLANT
PAD ARMBOARD 7.5X6 YLW CONV (MISCELLANEOUS) ×4 IMPLANT
PATTIES SURGICAL .5 X.5 (GAUZE/BANDAGES/DRESSINGS) IMPLANT
PATTIES SURGICAL .75X.75 (GAUZE/BANDAGES/DRESSINGS) ×2 IMPLANT
ROD 35MMX5.5 (Rod) ×4 IMPLANT
SCREW MATRIX MIS 7.0X40MM (Screw) ×4 IMPLANT
SCREW MATRIX MIS 7.0X45MM (Screw) ×4 IMPLANT
SPACER TPAL 11MMX10MMX28MM (Spacer) ×2 IMPLANT
SPONGE LAP 18X18 X RAY DECT (DISPOSABLE) IMPLANT
SPONGE LAP 4X18 RFD (DISPOSABLE) ×2 IMPLANT
SPONGE SURGIFOAM ABS GEL 100 (HEMOSTASIS) ×2 IMPLANT
STAPLER VISISTAT 35W (STAPLE) IMPLANT
SURGIFLO W/THROMBIN 8M KIT (HEMOSTASIS) ×2 IMPLANT
SUT BONE WAX W31G (SUTURE) ×2 IMPLANT
SUT VIC AB 1 CTX 36 (SUTURE) ×1
SUT VIC AB 1 CTX36XBRD ANBCTR (SUTURE) ×1 IMPLANT
SUT VIC AB 2-0 CT1 27 (SUTURE) ×1
SUT VIC AB 2-0 CT1 TAPERPNT 27 (SUTURE) ×1 IMPLANT
SUT VIC AB 3-0 X1 27 (SUTURE) ×2 IMPLANT
TAP CANN 5MM (TAP) ×2 IMPLANT
TAP CANN 6MM (TAP) ×2 IMPLANT
TOWEL GREEN STERILE (TOWEL DISPOSABLE) ×2 IMPLANT
TOWEL GREEN STERILE FF (TOWEL DISPOSABLE) ×2 IMPLANT
TRAY FOLEY MTR SLVR 16FR STAT (SET/KITS/TRAYS/PACK) ×2 IMPLANT
WATER STERILE IRR 1000ML POUR (IV SOLUTION) ×2 IMPLANT
YANKAUER SUCT BULB TIP NO VENT (SUCTIONS) ×2 IMPLANT

## 2017-12-23 NOTE — H&P (Addendum)
Justin [name] is an 52 y.o. male.   Chief Complaint: back pain and leg pain HPI: 52 year old male with history of L5-S1 spinal stenosis and the above complaint presents for surgical intervention.  Progressively worsening symptoms.  Failed conservative treatment.  Past Medical History:  Diagnosis Date  . Achilles rupture, left   . Bipolar 1 disorder (HCC)   . Dental caries    periodontitis  . Pneumonia   . Pulmonary embolism (HCC)   . Sleep apnea    wears CPAP  . Subdural hematoma (HCC) 2015    Past Surgical History:  Procedure Laterality Date  . ACHILLES TENDON SURGERY Left 10/21/2014   Procedure: Left Achilles Reconstruction;  Surgeon: Nadara Mustard, MD;  Location: [facility] OR;  Service: Orthopedics;  Laterality: Left;  . APPENDECTOMY    . CRANIOTOMY N/A 01/19/2013   Procedure: CRANIOTOMY HEMATOMA EVACUATION SUBDURAL;  Surgeon: Hewitt Shorts, MD;  Location: [facility] ORS;  Service: Neurosurgery;  Laterality: N/A;  . CYSTECTOMY     right head  . ELBOW SURGERY     right  . FRACTURE SURGERY     finger  . I&D EXTREMITY Left 12/07/2016   Procedure: LEFT ACHILLES DEBRIDEMENT;  Surgeon: Nadara Mustard, MD;  Location: [facility] OR;  Service: Orthopedics;  Laterality: Left;  Marland Kitchen MULTIPLE EXTRACTIONS WITH ALVEOLOPLASTY N/A 03/07/2015   Procedure: MULTIPLE EXTRACTION WITH ALVEOLOPLASTY;  Surgeon: [name], DDS;  Location: [facility] OR;  Service: Oral Surgery;  Laterality: N/A;    Family History  Problem Relation Age of Onset  . Hypertension Mother   . Stroke Mother   . Multiple sclerosis Sister   . Down syndrome Son    Social History:  reports that he has never smoked. He has never used smokeless tobacco. He reports that he does not drink alcohol or use drugs.  Allergies: No Known Allergies  Medications Prior to Admission  Medication Sig Dispense Refill  . doxepin (SINEQUAN) 10 MG capsule Take 20 mg by mouth at bedtime.   1  . hydrOXYzine (ATARAX/VISTARIL) 25 MG tablet Take 25 mg by mouth 3  (three) times daily as needed for anxiety.   2  . lithium 600 MG capsule Take 600 mg by mouth at bedtime.    . mirtazapine (REMERON) 45 MG tablet Take 45 mg by mouth at bedtime.    Marland Kitchen QUEtiapine (SEROQUEL XR) 400 MG 24 hr tablet Take 800 mg by mouth at bedtime.    Marland Kitchen acetaminophen (TYLENOL) 500 MG tablet Take 2 tablets (1,000 mg total) by mouth every 6 (six) hours as needed. (Patient not taking: Reported on 12/13/2017) 30 tablet 0  . aspirin EC 81 MG tablet Take 1 tablet (81 mg total) by mouth daily. (Patient not taking: Reported on 12/13/2017) 30 tablet 0  . cyclobenzaprine (FLEXERIL) 10 MG tablet Take 1 tablet (10 mg total) by mouth 3 (three) times daily as needed for muscle spasms. (Patient not taking: Reported on 12/13/2017) 20 tablet 0  . meloxicam (MOBIC) 15 MG tablet Take 1 tablet (15 mg total) by mouth daily. TAKE WITH MEALS (Patient not taking: Reported on 12/13/2017) 30 tablet 0  . methocarbamol (ROBAXIN) 500 MG tablet Take 1 tablet (500 mg total) by mouth 2 (two) times daily. (Patient not taking: Reported on 12/13/2017) 20 tablet 0  . naproxen (NAPROSYN) 500 MG tablet Take 1 tablet (500 mg total) by mouth 2 (two) times daily as needed for mild pain, moderate pain or headache (TAKE WITH MEALS.). (Patient not taking: Reported  on 12/13/2017) 20 tablet 0  . Olopatadine HCl 0.2 % SOLN Apply 1 drop to eye daily. (Patient not taking: Reported on 12/13/2017) 2.5 mL 0  . oxyCODONE-acetaminophen (PERCOCET/ROXICET) 5-325 MG tablet Take 1 tablet by mouth 2 (two) times daily as needed for severe pain. (Patient not taking: Reported on 12/13/2017) 10 tablet 0  . traMADol (ULTRAM) 50 MG tablet Take 1 tablet (50 mg total) by mouth every 6 (six) hours as needed. (Patient not taking: Reported on 12/13/2017) 50 tablet 0    No results found for this or any previous visit (from the past 48 hour(s)). No results found.  Review of Systems  Constitutional: Negative.   HENT: Negative.   Respiratory: Negative.    Cardiovascular: Negative.   Musculoskeletal: Positive for back pain.  Skin: Negative.   Neurological: Positive for tingling.  Psychiatric/Behavioral: Negative.     Blood pressure (!) 146/101, pulse 81, temperature 97.9 F (36.6 C), temperature source Oral, resp. rate 18, height 5\' 9"  (1.753 m), weight 103.9 kg, SpO2 100 %. Physical Exam  Constitutional: He is oriented to person, place, and time. He appears well-developed.  HENT:  Head: Normocephalic and atraumatic.  Eyes: Pupils are equal, round, and reactive to light.  Neck: Normal range of motion.  Cardiovascular: Normal heart sounds.  Respiratory: No respiratory distress.  GI: He exhibits no distension.  Musculoskeletal: He exhibits tenderness.  Neurological: He is alert and oriented to person, place, and time.  Skin: Skin is warm and dry.  Psychiatric: He has a normal mood and affect.     Assessment/Plan   We will proceed with L5 GILL PROCEDURE, RIGHT L5-S1, TRANSFORAMIAL LUMBAR INTERBODY FUSION, BILATERAL LATERAL FUSION, PEDICLE INSTRUMENTATION as scheduled.  Surgical suture along with potential risks and complications discussed.  All questions answered.  Zonia Kief, PA-C 12/23/2017, 10:05 AM

## 2017-12-23 NOTE — Anesthesia Preprocedure Evaluation (Signed)
Anesthesia Evaluation  Patient identified by MRN, date of birth, ID band Patient awake    Reviewed: Allergy & Precautions, NPO status , Patient's Chart, lab work & pertinent test results  Airway Mallampati: I       Dental no notable dental hx. (+) Teeth Intact   Pulmonary sleep apnea ,    Pulmonary exam normal breath sounds clear to auscultation       Cardiovascular + Peripheral Vascular Disease  Normal cardiovascular exam Rhythm:Regular Rate:Normal  See TTE below   Neuro/Psych PSYCHIATRIC DISORDERS Bipolar Disorder    GI/Hepatic negative GI ROS, Neg liver ROS,   Endo/Other  negative endocrine ROS  Renal/GU negative Renal ROS  negative genitourinary   Musculoskeletal   Abdominal (+) + obese,   Peds  Hematology negative hematology ROS (+)   Anesthesia Other Findings Justin [name]  ECHO COMPLETE WO IMAGING ENHANCING AGENT  Order# [id number]  Reading physician: Laurey Morale, MD Ordering physician: Rema Jasmine, MD Study date: 03/26/16 Study Result   Result status: Final result                             *[place]*                   *[facility]*                         [address]                        [place], [place] [address]                            [phone]  ------------------------------------------------------------------- Transthoracic Echocardiography  Patient:    Justin [name], Justin [name] MR #:       [id number] Study Date: 03/26/2016 Gender:     M Age:        52 Height:     175.3 cm Weight:     112.5 kg BSA:        2.39 m^2 Pt. Status: Room:   PERFORMING   [facility], Outpatient  SONOGRAPHER  Lysbeth Galas, RDCS  ATTENDING    Ellan Lambert, [place]  ORDERING     [place], [place]  REFERRING    [facility], [place]  REFERRING    Pa, Alpha  cc:  ------------------------------------------------------------------- LV EF:  40%  ------------------------------------------------------------------- Indications:      TIA 435.9.  ------------------------------------------------------------------- History:   PMH:  Hx: Pulmonary Embolism.  ------------------------------------------------------------------- Study Conclusions  - Left ventricle: The cavity size was normal. Wall thickness was   increased in a pattern of mild LVH. The estimated ejection   fraction was 40%. Diffuse hypokinesis. Doppler parameters are   consistent with abnormal left ventricular relaxation (grade 1   diastolic dysfunction). - Aortic valve: There was no stenosis. - Mitral valve: There was no significant regurgitation. - Right ventricle: The cavity size was normal. Systolic function   was normal. - Pulmonary arteries: No complete TR doppler jet so unable to   estimate PA systolic pressure. - Inferior vena cava: The vessel was normal in size. The   respirophasic diameter changes were in the normal range (>= 50%),   consistent with normal central venous pressure.  Impressions:  - Normal LV size with mild LV hypertrophy. EF 40% with diffuse   hypokinesis.  Normal RV size and systolic function. No significant   valvular abnormalities.  ------------------------------------------------------------------- Study     Reproductive/Obstetrics                             Anesthesia Physical Anesthesia Plan  ASA: II  Anesthesia Plan: General   Post-op Pain Management:    Induction: Intravenous  PONV Risk Score and Plan: 3 and Ondansetron, Dexamethasone and Midazolam  Airway Management Planned: Oral ETT  Additional Equipment:   Intra-op Plan:   Post-operative Plan: Extubation in OR  Informed Consent: I have reviewed the patients History and Physical, chart, labs and discussed the procedure including the risks, benefits and alternatives for the proposed anesthesia with the patient or authorized  representative who has indicated his/her understanding and acceptance.   Dental advisory given  Plan Discussed with:   Anesthesia Plan Comments:         Anesthesia Quick Evaluation

## 2017-12-23 NOTE — Transfer of Care (Signed)
Immediate Anesthesia Transfer of Care Note  Patient: Justin [name]  Procedure(s) Performed: L5 GILL PROCEDURE, RIGHT L5-S1, TRANSFORAMIAL LUMBAR INTERBODY FUSION, BILATERAL LATERAL FUSION, PEDICLE INSTRUMENTATION (Spine Lumbar)  Patient Location: PACU  Anesthesia Type:General  Level of Consciousness: drowsy  Airway & Oxygen Therapy: Patient Spontanous Breathing and Patient connected to nasal cannula oxygen  Post-op Assessment: Report given to RN, Post -op Vital signs reviewed and stable and Patient moving all extremities  Post vital signs: Reviewed and stable  Last Vitals:  Vitals Value Taken Time  BP 129/69 12/23/2017  6:00 PM  Temp 36.6 C 12/23/2017  6:00 PM  Pulse 107 12/23/2017  6:08 PM  Resp 18 12/23/2017  6:08 PM  SpO2 100 % 12/23/2017  6:08 PM  Vitals shown include unvalidated device data.  Last Pain:  Vitals:   12/23/17 1800  TempSrc:   PainSc: Asleep         Complications: No apparent anesthesia complications

## 2017-12-23 NOTE — Anesthesia Procedure Notes (Signed)
Procedure Name: Intubation Date/Time: 12/23/2017 1:51 PM Performed by: Ellias Mcelreath T, CRNA Pre-anesthesia Checklist: Patient identified, Emergency Drugs available, Suction available and Patient being monitored Patient Re-evaluated:Patient Re-evaluated prior to induction Oxygen Delivery Method: Circle system utilized Preoxygenation: Pre-oxygenation with 100% oxygen Induction Type: IV induction Ventilation: Mask ventilation without difficulty Laryngoscope Size: Miller and 3 Grade View: Grade I Tube type: Oral Tube size: 7.5 mm Number of attempts: 1 Airway Equipment and Method: Patient positioned with wedge pillow and Stylet Placement Confirmation: ETT inserted through vocal cords under direct vision,  positive ETCO2 and breath sounds checked- equal and bilateral Secured at: 22 cm Tube secured with: Tape Dental Injury: Teeth and Oropharynx as per pre-operative assessment  Comments: Pt w upper dentures that he was unable to take out pre op. Informed pt CRNA would attempt when pt asleep in OR. CRNA unable to remove upper dentures. MDA aware, dentures to remain in for procedure

## 2017-12-23 NOTE — Interval H&P Note (Signed)
History and Physical Interval Note:  12/23/2017 1:29 PM  Justin [name]  has presented today for surgery, with the diagnosis of L5-S1 Biforaminal stenosis, bilateral spondylolysis  The various methods of treatment have been discussed with the patient and family. After consideration of risks, benefits and other options for treatment, the patient has consented to  Procedure(s): L5 GILL PROCEDURE, RIGHT L5-S1, TRANSFORAMIAL LUMBAR INTERBODY FUSION, BILATERAL LATERAL FUSION, PEDICLE INSTRUMENTATION (N/A) as a surgical intervention .  The patient's history has been reviewed, patient examined, no change in status, stable for surgery.  I have reviewed the patient's chart and labs.  Questions were answered to the patient's satisfaction.     Eldred Manges

## 2017-12-23 NOTE — Op Note (Addendum)
Preop diagnosis: Bilateral L5 spondylolysis with spondylolisthesis, lateral recess stenosis, extradural intraspinal facet cyst with compression, bilateral foraminal stenosis with radiculopathy.  Postop diagnosis: Same  Procedure: Gill procedure L5-S1, removal of unstable posterior elements, removal of intraspinal extradural facet cyst, microscope assisted.  Right transforaminal interbody fusion with 11 mm lordotic cage local bone interbody and bilateral lateral fusion.  One level pedicle instrumentation,  L5-S1 level. Local bone for cage, interbody space and bilateral lateral fusion due to instability.   Surgeon: Annell Greening, MD  Assistant: Zonia Kief, PA-C medically necessary and present for expiration excision of interbody cyst instrumentation lateral fusion, interbody fusion and cage, medically necessary for the surgical procedure.  Anesthesia: General  EBL: 250 cc  Transfusion: None  Instrumentation:  Depuy Synthes matrix pedicle instrumentation. T-PAL 11mm lordotic cage packed with local bone.  7 x 40 mm screws in L5 and 7 x 35 mm screws in S1.  35mm rods x2.  Brief history: 52 year old male with progressive back pain claudication symptoms chronic bilateral pars defects L5 with cystic changes and left foraminal intraspinal extradural cyst with listhesis and disc bulge causing by foraminal impingement at the L5-S1 level.  All other levels were normal.  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  Unstable spondylolisthesis with shifting 4 to 5 mm reducing in supine position.  Positive EMGs nerve conduction velocities for chronic S1 radiculopathy bilaterally.  Procedure: After induction general anesthesia patient was placed prone on spine frame careful padding and positioning calf pumpers for DVT prophylaxis preoperative antibiotics timeout procedure DuraPrep area squared with towels Betadine Steri-Drape application and  laminectomy sheets and drapes.  Midline incision was made sub-proximal dissection onto the sacrum and L5 posterior elements with exposure of spinous process in the inferior aspect of L4 was performed out over the large hypertrophic facets.  2 Kocher clamps were placed and sterilely draped C-arm was brought in to confirm appropriate level and bone was marked with a blue skin marker.  Decompression centrally was performed.  On the left side there was overgrowing pars defect causing severe lateral recess stenosis and foraminal stenosis.  Posterior elements were unstable posterior elements were removed on both sides which were very wobbly.  Facet cyst on the left side using the sterilely draped operative microscope and microdissection techniques was peeled off the dura removing hypertrophic ligamentum in both gutters for lateral recess decompression and decompression of the foramen out right and left.  Transverse process at L5 was exposed bilaterally and this was exposed on the right side Penfield 4 placed in the disc space and again C arm was sterilely used to confirm the appropriate level.  Bone was removed out on the right side excising the facet for entry into the disc space.  Discectomy was performed the anterior portion of the annulus was intact patient had a tall disc space anteriorly measuring 12 mm by preoperative imaging studies.  Endplates were curetted using rasps ring curettes angled curettes with and without teeth sharp straight pituitaries angled pituitaries.  Progressive dilators were placed up to a 10 eventually 11 mm trial sizer would fit with tight fit left millimeters cage was placed after all the bone that had been taken from the unstable posterior element had been removed to all soft tissue prepared into small pieces of bone packed anteriorly into the disc space compressed and then progressively using a 7 mm banana-shaped trial to make sure that the permanent 11 mm  cage would fit.  11 mm cage was  packed with bone impacted flush with the end of the endplate posteriorly and then checked with C arm.  It was then kicked over almost transverse countersunk 2 mm and was extremely tight with visualized bone graft anteriorly as well as visualized bone inside the cage.  Cage was stable.  Pedicles on the right side at L5 and S1 were then instrumented placing screws.  Starter was used checked with C arm followed by the joystick and passing down the pedicle.  They were angled medially and medially along the pedicle to pedicle was palpated to make sure that there was no violation of the pedicle.  Patient had a large pedicles and 7 mm x 40 screws were placed right and left L5 and 7 x 35 mm screws in S1.  Minimal gap between the cages compression was performed with 2 mm compression.  C-arm visualization to confirm appropriate position of the pedicle screws was performed and sequentially the pedicle feeler was passed to make sure there is bone anteriorly and filling the inferior medial aspect of the pedicle to make sure there is no violation.  Rods were placed compressed tightening the right side first fall with a left side compression.  Bone was then packed on the decorticated transverse processes and sacral ala.  Canal was checked to make sure that there was no bone in the lateral gutters of the canal.  Epidural veins were coagulated with the bipolar cautery under microscope visualization.  L5 nerve root was free on both sides.  Spondylolisthesis was reduced on C arm visualization.  1 to 10 mm of rod was present above the L5 screws and several millimeters inferior to the S1 screws.  Final imaging AP lateral fluoroscopy pictures were taken followed by copious irrigation.  #1 Vicryl the deep fascia to on subtenons tissue skin staple closure 20 cc Marcaine infiltration in the skin subtenons tissue and postoperative dressing patient was transferred to recovery room in stable condition.  Instrument count needle count was  correct.

## 2017-12-23 NOTE — Assessment & Plan Note (Signed)
Bilateral L5 spondylolithesis with unstable posterior elements and spondylolithesis 5 mm shift, intraspinal extradural facet cyst, bilateral L5-S1 biforaminal stenosis with chronic S1 radiculopathy.

## 2017-12-24 MED ORDER — OXYCODONE HCL 5 MG PO TABS
5.0000 mg | ORAL_TABLET | ORAL | Status: DC | PRN
  Administered 2017-12-24 – 2017-12-25 (×5): 10 mg via ORAL
  Filled 2017-12-24 (×5): qty 2

## 2017-12-24 MED FILL — Thrombin (Recombinant) For Soln 20000 Unit: CUTANEOUS | Qty: 1 | Status: AC

## 2017-12-24 NOTE — Progress Notes (Signed)
Subjective: 1 Day Post-Op Procedure(s): L5 GILL PROCEDURE, RIGHT L5-S1, TRANSFORAMIAL LUMBAR INTERBODY FUSION, BILATERAL LATERAL FUSION, PEDICLE INSTRUMENTATION Patient reports pain as moderate.  Has some numbness in right foot same as pre-op.  Has been OOB stood last night. Pre-op EMG NCV positive for chronic S1 radiculopathy.   Objective: Vital signs in last 24 hours: Temp:  [97.3 F (36.3 C)-98.8 F (37.1 C)] 98.8 F (37.1 C) (10/08 0316) Pulse Rate:  [81-110] 89 (10/08 0316) Resp:  [16-28] 16 (10/08 0316) BP: (112-146)/(65-101) 124/78 (10/08 0316) SpO2:  [94 %-100 %] 96 % (10/08 0316) Weight:  [103.9 kg] 103.9 kg (10/07 0847)  Intake/Output from previous day: 10/07 0701 - 10/08 0700 In: 4101.9 [P.O.:240; I.V.:3511.9; IV Piggyback:350] Out: 2250 [Urine:1800; Emesis/NG output:50; Blood:400] Intake/Output this shift: No intake/output data recorded.  No results for input(s): HGB in the last 72 hours. No results for input(s): WBC, RBC, HCT, PLT in the last 72 hours. No results for input(s): NA, K, CL, CO2, BUN, CREATININE, GLUCOSE, CALCIUM in the last 72 hours. No results for input(s): LABPT, INR in the last 72 hours.  Neurologically intact good ankle DF and PF against resistance.  Dg Lumbar Spine 2-3 Views  Result Date: 12/23/2017 CLINICAL DATA:  L5 Gill procedure, RIGHT L5-S1 TLIF EXAM: LUMBAR SPINE - 2-3 VIEW; DG C-ARM 61-120 MIN COMPARISON:  Preoperative radiograph 09/26/2017 FINDINGS: Preoperative exam demonstrates 5 lumbar vertebra. BILATERAL pedicle screws and posterior bars at L5-S1 with an intervening disc prosthesis. Bones appear demineralized. No fracture or subluxation. IMPRESSION: Posterior fusion L5-S1. Electronically Signed   By: Ulyses Southward M.D.   On: 12/23/2017 17:19   Dg C-arm 1-60 Min  Result Date: 12/23/2017 CLINICAL DATA:  L5 Gill procedure, RIGHT L5-S1 TLIF EXAM: LUMBAR SPINE - 2-3 VIEW; DG C-ARM 61-120 MIN COMPARISON:  Preoperative radiograph 09/26/2017  FINDINGS: Preoperative exam demonstrates 5 lumbar vertebra. BILATERAL pedicle screws and posterior bars at L5-S1 with an intervening disc prosthesis. Bones appear demineralized. No fracture or subluxation. IMPRESSION: Posterior fusion L5-S1. Electronically Signed   By: Ulyses Southward M.D.   On: 12/23/2017 17:19   Dg C-arm 1-60 Min  Result Date: 12/23/2017 CLINICAL DATA:  L5 Gill procedure, RIGHT L5-S1 TLIF EXAM: LUMBAR SPINE - 2-3 VIEW; DG C-ARM 61-120 MIN COMPARISON:  Preoperative radiograph 09/26/2017 FINDINGS: Preoperative exam demonstrates 5 lumbar vertebra. BILATERAL pedicle screws and posterior bars at L5-S1 with an intervening disc prosthesis. Bones appear demineralized. No fracture or subluxation. IMPRESSION: Posterior fusion L5-S1. Electronically Signed   By: Ulyses Southward M.D.   On: 12/23/2017 17:19   Dg C-arm 1-60 Min  Result Date: 12/23/2017 CLINICAL DATA:  L5 Gill procedure, RIGHT L5-S1 TLIF EXAM: LUMBAR SPINE - 2-3 VIEW; DG C-ARM 61-120 MIN COMPARISON:  Preoperative radiograph 09/26/2017 FINDINGS: Preoperative exam demonstrates 5 lumbar vertebra. BILATERAL pedicle screws and posterior bars at L5-S1 with an intervening disc prosthesis. Bones appear demineralized. No fracture or subluxation. IMPRESSION: Posterior fusion L5-S1. Electronically Signed   By: Ulyses Southward M.D.   On: 12/23/2017 17:19    Assessment/Plan: 1 Day Post-Op Procedure(s): L5 GILL PROCEDURE, RIGHT L5-S1, TRANSFORAMIAL LUMBAR INTERBODY FUSION, BILATERAL LATERAL FUSION, PEDICLE INSTRUMENTATION Up with therapy with brace. D/c foley. Ambulation with therapy and walker.  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 Was on coumadin and had subdural bleed.  MUST HAVE SCD AT ALL TIMES EXCEPT WHEN WALKING.  Discussed risks of epidural bleeding at site of surgery or brain vs risks of DVT again with patient as I did pre-op. He understands  risks . Plan early mobilization SCD's and possibly home with ASA one po daily.   Eldred Manges 12/24/2017, 7:44  AM
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
## 2017-12-24 NOTE — Progress Notes (Signed)
CSW consulted with patient regarding SNF recommendation, patient reports he is home alone and therefore requiring SNF at this time. Patient given list of SNF's as he is not familiar with those in the area, CSW starred facilities that accept patient's insurance of Medicaid. Patient reports he will need to review. CSW will meet in morning for SNF choice.   [place], [place] [phone]

## 2017-12-24 NOTE — Plan of Care (Signed)
  Problem: Education: Goal: Knowledge of General Education information will improve Description: Including pain rating scale, medication(s)/side effects and non-pharmacologic comfort measures Outcome: Progressing   Problem: Health Behavior/Discharge Planning: Goal: Ability to manage health-related needs will improve Outcome: Progressing   Problem: Clinical Measurements: Goal: Ability to maintain clinical measurements within normal limits will improve Outcome: Progressing Goal: Will remain free from infection Outcome: Progressing Goal: Cardiovascular complication will be avoided Outcome: Progressing   Problem: Activity: Goal: Risk for activity intolerance will decrease Outcome: Progressing   Problem: Nutrition: Goal: Adequate nutrition will be maintained Outcome: Progressing   Problem: Coping: Goal: Level of anxiety will decrease Outcome: Progressing   

## 2017-12-24 NOTE — Progress Notes (Signed)
RN changed patient dressing on back with 4x4 gauze and ABD pads because it was saturated. Notified MD he stated change dressing PRN.

## 2017-12-24 NOTE — Plan of Care (Signed)
  Problem: Nutrition: Goal: Adequate nutrition will be maintained Outcome: Progressing   Problem: Education: Goal: Knowledge of General Education information will improve Description Including pain rating scale, medication(s)/side effects and non-pharmacologic comfort measures Outcome: Progressing   Problem: Safety: Goal: Ability to remain free from injury will improve Outcome: Progressing   Problem: Pain Managment: Goal: General experience of comfort will improve Outcome: Progressing

## 2017-12-24 NOTE — Progress Notes (Signed)
Orthopedic Tech Progress Note Patient Details:  Justin [name] 1965/07/21 [id number]  Patient ID: Justin [name], male   DOB: 01/04/1966, 52 y.o.   MRN: [id number]   [name] 12/24/2017, 9:31 AM Called in bio-tech brace order; spoke with Wylene Men

## 2017-12-24 NOTE — Progress Notes (Signed)
RN called MD because PT stated that when pt got up with her he got an immediate headache and PT was worried especially with his drainage on his back. MD stated that pt did not have concern due to the fact pt does not have a dural tear. Will continue to monitor
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
## 2017-12-24 NOTE — Progress Notes (Signed)
RN paged Ortho tech about Government social research officer. Ortho tech said when [employer] opens this AM he will get them to start working on it"

## 2017-12-25 ENCOUNTER — Inpatient Hospital Stay (HOSPITAL_COMMUNITY): Payer: Medicaid Other

## 2017-12-25 ENCOUNTER — Encounter (HOSPITAL_COMMUNITY): Payer: Self-pay | Admitting: General Practice

## 2017-12-25 DIAGNOSIS — M79609 Pain in unspecified limb: Secondary | ICD-10-CM

## 2017-12-25 MED ORDER — IBUPROFEN 200 MG PO TABS
200.0000 mg | ORAL_TABLET | Freq: Four times a day (QID) | ORAL | Status: DC | PRN
  Administered 2017-12-25 – 2017-12-27 (×3): 200 mg via ORAL
  Filled 2017-12-25 (×3): qty 1

## 2017-12-25 NOTE — Progress Notes (Signed)
*  Preliminary Results* Right lower extremity venous duplex completed. Right lower extremity is negative for deep vein thrombosis. There is no evidence of right Baker's cyst.  12/25/2017 2:02 PM  Blanch Media

## 2017-12-25 NOTE — Progress Notes (Signed)
   Subjective: 2 Days Post-Op Procedure(s): L5 GILL PROCEDURE, RIGHT L5-S1, TRANSFORAMIAL LUMBAR INTERBODY FUSION, BILATERAL LATERAL FUSION, PEDICLE INSTRUMENTATION Patient reports pain as moderate.    Objective: Vital signs in last 24 hours: Temp:  [98.9 F (37.2 C)-101.3 F (38.5 C)] 98.9 F (37.2 C) (10/09 0443) Pulse Rate:  [97-111] 111 (10/09 0443) Resp:  [16-20] 16 (10/09 0443) BP: (121-138)/(80-86) 126/84 (10/09 0443) SpO2:  [96 %-99 %] 98 % (10/09 0443)  Intake/Output from previous day: 10/08 0701 - 10/09 0700 In: 1534.5 [I.V.:1534.5] Out: 2000 [Urine:2000] Intake/Output this shift: No intake/output data recorded.  No results for input(s): HGB in the last 72 hours. No results for input(s): WBC, RBC, HCT, PLT in the last 72 hours. No results for input(s): NA, K, CL, CO2, BUN, CREATININE, GLUCOSE, CALCIUM in the last 72 hours. No results for input(s): LABPT, INR in the last 72 hours.  Neurologically intact walked to door and back.  No results found.  Assessment/Plan: 2 Days Post-Op Procedure(s): L5 GILL PROCEDURE, RIGHT L5-S1, TRANSFORAMIAL LUMBAR INTERBODY FUSION, BILATERAL LATERAL FUSION, PEDICLE INSTRUMENTATION Up with therapy, He states his sister will not be available to be with him. SNF will be needed. Lives alone. D/C dilaudid  Eldred Manges 12/25/2017, 7:17 AM

## 2017-12-25 NOTE — Progress Notes (Signed)
Pt called RN to room c/o pain in his right calf. Pt has both TED hose and SCDs on bilaterally. Dr. Ophelia Charter notified and stated pt needs to be getting up and walking more, but since pt has history of DVT, Dr. Ophelia Charter gave verbal order for venous doppler to r/o DVT. Will continue to monitor.

## 2017-12-25 NOTE — Progress Notes (Signed)
Patient ID: [name], male   DOB: 10/17/1965, 52 y.o.   MRN: [id number] Nursing called fever 102.8,POD#2 Gill procedure with TLIF. Hx of pneumoniak, PE and Sleep apnea. Some improvement with IS. Respiratory Consult for home CPAP Blood Cult x 2 PCXR KUB CBC with diff CMET UA CRP baseline Give motrin and tylenol. Call if no improvement or if fever worsens to greater than 102.5

## 2017-12-25 NOTE — Progress Notes (Signed)
Physical Therapy Treatment Patient Details Name: Justin [name] MRN: [id number] DOB: [birth year] Today's Date: 12/25/2017    History of Present Illness Pt is a 52 yo male who presents for L5-S1 fusion due to spinal stenosis and worsening radiculopathy RLE. PMH: bipolar, sleep apnea, L achilles tendon rupture, PE.    PT Comments    Patient seen for mobility progression. Pt is making progress toward PT goals and tolerated increased activity this session. Pt requires min A for safe gait training with RW due to noted R LE weakness and pain. Continue to progress as tolerated with anticipated d/c to SNF for further skilled PT services.      Follow Up Recommendations  SNF;Supervision/Assistance - 24 hour     Equipment Recommendations  Rolling walker with 5" wheels    Recommendations for Other Services       Precautions / Restrictions Precautions Precautions: Back Precaution Comments: reviewed back precautions Required Braces or Orthoses: Spinal Brace Spinal Brace: Applied in standing position;Lumbar corset    Mobility  Bed Mobility Overal bed mobility: Needs Assistance Bed Mobility: Rolling;Sidelying to Sit Rolling: Min guard Sidelying to sit: Min guard       General bed mobility comments: min guard for safety; cues for sequencing; pt used rail to roll and needs increased time   Transfers Overall transfer level: Needs assistance Equipment used: Rolling walker (2 wheeled) Transfers: Sit to/from Stand Sit to Stand: Mod assist         General transfer comment: cues for hand placement; pt needing to use both hands on RW to pull up with assistance to power up and to steady  Ambulation/Gait Ambulation/Gait assistance: Min assist Gait Distance (Feet): (54ft X2 trials) Assistive device: Rolling walker (2 wheeled) Gait Pattern/deviations: Step-through pattern;Decreased stride length;Decreased dorsiflexion - right Gait velocity: decreased   General Gait Details: cues for  posture; pt with increasing R LE weakness noted with increased mobility   Stairs             Wheelchair Mobility    Modified Rankin (Stroke Patients Only)       Balance                                            Cognition Arousal/Alertness: Awake/alert Behavior During Therapy: Anxious Overall Cognitive Status: Within Functional Limits for tasks assessed                                 General Comments: pt needed frequent cues for breathing as was very anxious and holding breath      Exercises      General Comments General comments (skin integrity, edema, etc.): pt with drainage soaking through dressing and gown-RN notified      Pertinent Vitals/Pain Pain Assessment: Faces Faces Pain Scale: Hurts even more Pain Location: R buttocks Pain Descriptors / Indicators: Constant;Burning;Grimacing;Guarding Pain Intervention(s): Limited activity within patient's tolerance;Monitored during session;Repositioned;Premedicated before session    Home Living                      Prior Function            PT Goals (current goals can now be found in the care plan section) Progress towards PT goals: Progressing toward goals    Frequency    Min 5X/week  PT Plan Current plan remains appropriate    Co-evaluation              AM-PAC PT "6 Clicks" Daily Activity  Outcome Measure  Difficulty turning over in bed (including adjusting bedclothes, sheets and blankets)?: Unable Difficulty moving from lying on back to sitting on the side of the bed? : Unable Difficulty sitting down on and standing up from a chair with arms (e.g., wheelchair, bedside commode, etc,.)?: Unable Help needed moving to and from a bed to chair (including a wheelchair)?: A Lot Help needed walking in hospital room?: A Little Help needed climbing 3-5 steps with a railing? : A Lot 6 Click Score: 10    End of Session Equipment Utilized During  Treatment: Gait belt;Back brace Activity Tolerance: Patient tolerated treatment well Patient left: with call bell/phone within reach;in chair Nurse Communication: Mobility status PT Visit Diagnosis: Pain;Difficulty in walking, not elsewhere classified (R26.2) Pain - part of body: (back and head)     Time: 9024-0973 PT Time Calculation (min) (ACUTE ONLY): 31 min  Charges:  $Gait Training: 8-22 mins $Therapeutic Activity: 8-22 mins                     Erline Levine, PTA Acute Rehabilitation Services Pager: [phone] Office: [phone]     Carolynne Edouard 12/25/2017, 12:55 PM

## 2017-12-25 NOTE — Clinical Social Work Note (Signed)
Clinical Social Work Assessment  Patient Details  Name: Justin [name] MRN: [id number] Date of Birth: 1965-10-25  Date of referral:  12/25/17               Reason for consult:  Discharge Planning                Permission sought to share information with:  Case Manager, Facility Medical sales representative, Family Supports Permission granted to share information::  Yes, Verbal Permission Granted  Name::     Teacher, music  Agency::  SNFs  Relationship::  sister  Contact Information:  [phone]  Housing/Transportation Living arrangements for the past 2 months:  Single Family Home Source of Information:  Patient Patient Interpreter Needed:  None Criminal Activity/Legal Involvement Pertinent to Current Situation/Hospitalization:  No - Comment as needed Significant Relationships:  Siblings Lives with:  Self Do you feel safe going back to the place where you live?  No Need for family participation in patient care:  No (Coment)  Care giving concerns:  CSW received referral for possible SNF placement at time of discharge. Spoke with patient regarding possibility of SNF placement . Patient  is currently unable to care for himself at home alone given patient's current needs and fall risk.  Patient expressed understanding of PT recommendation and are agreeable to SNF placement at time of discharge. CSW to continue to follow and assist with discharge planning needs.     Social Worker assessment / plan: Spoke with patient concerning possibility of rehab at SNF before returning home.    Employment status:  Other (Comment)(unknown) Insurance information:  Medicaid In [place] PT Recommendations:  Skilled Nursing Facility Information / Referral to community resources:  Skilled Nursing Facility  Patient/Family's Response to care:  Patient recognizes need for rehab before returning home and is agreeable to a SNF in [place]. They report preference for  [facility] with second choice of [address] .  CSW explained insurance authorization process. Patient reports he wants to get stronger to get back home.     Patient/Family's Understanding of and Emotional Response to Diagnosis, Current Treatment, and Prognosis: Patient/family is realistic regarding therapy needs and expressed being hopeful for SNF placement. Patient expressed understanding of CSW role and discharge process as well as medical condition. No questions/concerns about plan or treatment.   Emotional Assessment Appearance:  Appears stated age Attitude/Demeanor/Rapport:  Self-Confident, Gracious, Engaged Affect (typically observed):  Accepting, Adaptable Orientation:  Oriented to Situation, Oriented to Self, Oriented to Place, Oriented to  Time Alcohol / Substance use:  Not Applicable Psych involvement (Current and /or in the community):  No (Comment)  Discharge Needs  Concerns to be addressed:  Discharge Planning Concerns, Care Coordination Readmission within the last 30 days:  No Current discharge risk:  Dependent with Mobility, Lives alone Barriers to Discharge:  Continued Medical Work up   [employer], LCSW 12/25/2017, 12:19 PM

## 2017-12-25 NOTE — Plan of Care (Signed)

## 2017-12-25 NOTE — NC FL2 (Signed)
[place] MEDICAID FL2 LEVEL OF CARE SCREENING TOOL     IDENTIFICATION  Patient Name: Justin [name] Birthdate: 05/26/1965 Sex: male Admission Date (Current Location): 12/23/2017  [place] [facility] and [place] Number:  Producer, television/film/video and Address:  [facility] [place], [address], [place], [place] [address]      Provider Number: [id number]  Attending Physician Name and Address:  Eldred Manges, MD  Relative Name and Phone Number:  Servando Snare [phone]    Current Level of Care: Hospital Recommended Level of Care: Skilled Nursing Facility Prior Approval Number:    Date Approved/Denied: 12/25/17 PASRR Number: [id number]  Discharge Plan: SNF    Current Diagnoses: Patient Active Problem List   Diagnosis Date Noted  . Lumbar stenosis 12/23/2017  . Spondylolisthesis, lumbar region 06/25/2017  . Radicular pain of right lower extremity 06/25/2017  . Achilles tendinitis of left lower extremity 12/07/2016  . Achilles tendinitis, left leg 05/17/2016  . Obstructive sleep apnea on CPAP 03/07/2015  . Post-operative state 03/07/2015  . Achilles rupture, left 10/21/2014  . OSA (obstructive sleep apnea) 04/23/2013  . Bipolar disorder, unspecified (HCC) 01/07/2013  . Sinus tachycardia 01/07/2013  . Headache(784.0) 01/07/2013  . SDH (subdural hematoma) (HCC) 01/04/2013  . H/O PE 01/04/2013  . Chronic anticoagulation 01/04/2013  . 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  . Acute sinusitis 01/04/2013    Orientation RESPIRATION BLADDER Height & Weight     Self, Time, Situation, Place  Normal, Other (Comment)(CPAP) Continent Weight: 229 lb (103.9 kg) Height:  5\' 9"  (175.3 cm)  BEHAVIORAL SYMPTOMS/MOOD NEUROLOGICAL BOWEL NUTRITION STATUS      Continent Diet(see discharge summary)  AMBULATORY STATUS COMMUNICATION OF NEEDS Skin   Limited Assist Verbally Surgical wounds(left ankle and back closed surgical incisions)                       Personal Care  Assistance Level of Assistance  Bathing, Feeding, Dressing, Total care Bathing Assistance: Limited assistance Feeding assistance: Independent Dressing Assistance: Limited assistance Total Care Assistance: Limited assistance   Functional Limitations Info  Sight, Hearing, Speech          SPECIAL CARE FACTORS FREQUENCY  PT (By licensed PT), OT (By licensed OT)     PT Frequency: 5x weekly OT Frequency: 5x weekly            Contractures Contractures Info: Not present    Additional Factors Info                  Current Medications (12/25/2017):  This is the current hospital active medication list Current Facility-Administered Medications  Medication Dose Route Frequency Provider Last Rate Last Dose  . 0.9 %  sodium chloride infusion  250 mL Intravenous Continuous Zonia Kief M, PA-C      . 0.9 %  sodium chloride infusion   Intravenous Continuous Naida Sleight, PA-C 85 mL/hr at 12/23/17 2119    . acetaminophen (TYLENOL) tablet 650 mg  650 mg Oral Q4H PRN Naida Sleight, PA-C   650 mg at 12/25/17 1116   Or  . acetaminophen (TYLENOL) suppository 650 mg  650 mg Rectal Q4H PRN Naida Sleight, PA-C      . docusate sodium (COLACE) capsule 100 mg  100 mg Oral BID Zonia Kief M, PA-C   100 mg at 12/25/17 0800  . doxepin (SINEQUAN) capsule 20 mg  20 mg Oral QHS Naida Sleight, PA-C   20 mg at 12/24/17 2120  .  hydrOXYzine (ATARAX/VISTARIL) tablet 25 mg  25 mg Oral TID PRN Naida Sleight, PA-C      . lactated ringers infusion   Intravenous Continuous Leilani Able, MD 50 mL/hr at 12/23/17 0850    . lithium carbonate capsule 600 mg  600 mg Oral QHS Naida Sleight, PA-C   600 mg at 12/24/17 2120  . menthol-cetylpyridinium (CEPACOL) lozenge 3 mg  1 lozenge Oral PRN Naida Sleight, PA-C       Or  . phenol (CHLORASEPTIC) mouth spray 1 spray  1 spray Mouth/Throat PRN Naida Sleight, PA-C      . methocarbamol (ROBAXIN) tablet 500 mg  500 mg Oral Q6H PRN Naida Sleight, PA-C   500 mg  at 12/25/17 1116   Or  . methocarbamol (ROBAXIN) 500 mg in dextrose 5 % 50 mL IVPB  500 mg Intravenous Q6H PRN Naida Sleight, PA-C      . mirtazapine (REMERON) tablet 45 mg  45 mg Oral QHS Zonia Kief M, PA-C   45 mg at 12/24/17 2120  . ondansetron (ZOFRAN) tablet 4 mg  4 mg Oral Q6H PRN Naida Sleight, PA-C       Or  . ondansetron [facility]) injection 4 mg  4 mg Intravenous Q6H PRN Naida Sleight, PA-C   4 mg at 12/23/17 2052  . oxyCODONE (Oxy IR/ROXICODONE) immediate release tablet 5-10 mg  5-10 mg Oral Q4H PRN Eldred Manges, MD   10 mg at 12/25/17 0800  . polyethylene glycol (MIRALAX / GLYCOLAX) packet 17 g  17 g Oral Daily PRN Naida Sleight, PA-C      . QUEtiapine (SEROQUEL XR) 24 hr tablet 800 mg  800 mg Oral QHS Zonia Kief M, PA-C   800 mg at 12/24/17 2120  . sodium chloride flush (NS) 0.9 % injection 3 mL  3 mL Intravenous Q12H Zonia Kief M, PA-C   3 mL at 12/25/17 0800  . sodium chloride flush (NS) 0.9 % injection 3 mL  3 mL Intravenous PRN Naida Sleight, PA-C         Discharge Medications: Please see discharge summary for a list of discharge medications.  Relevant Imaging Results:  Relevant Lab Results:   Additional Information SSN: [id number]  Gildardo Griffes, LCSW

## 2017-12-26 ENCOUNTER — Telehealth (INDEPENDENT_AMBULATORY_CARE_PROVIDER_SITE_OTHER): Payer: Self-pay | Admitting: *Deleted

## 2017-12-26 LAB — COMPREHENSIVE METABOLIC PANEL
ALK PHOS: 76 U/L (ref 38–126)
ALT: 24 U/L (ref 0–44)
AST: 40 U/L (ref 15–41)
Albumin: 3.6 g/dL (ref 3.5–5.0)
Anion gap: 12 (ref 5–15)
BUN: 9 mg/dL (ref 6–20)
CALCIUM: 8.6 mg/dL — AB (ref 8.9–10.3)
CO2: 23 mmol/L (ref 22–32)
Chloride: 102 mmol/L (ref 98–111)
Creatinine, Ser: 1.39 mg/dL — ABNORMAL HIGH (ref 0.61–1.24)
GFR calc Af Amer: >60 mL/min (ref >60)
GFR calc non Af Amer: 56 mL/min — ABNORMAL LOW (ref >60)
Glucose, Bld: 160 mg/dL — ABNORMAL HIGH (ref 70–99)
Potassium: 3.8 mmol/L (ref 3.5–5.1)
Sodium: 137 mmol/L (ref 135–145)
TOTAL PROTEIN: 6.7 g/dL (ref 6.5–8.1)
Total Bilirubin: 0.9 mg/dL (ref 0.3–1.2)

## 2017-12-26 LAB — CBC WITH DIFFERENTIAL/PLATELET
ABS IMMATURE GRANULOCYTES: 0.07 10*3/uL (ref 0.00–0.07)
BASOS ABS: 0 10*3/uL (ref 0.0–0.1)
Basophils Relative: 0 %
EOS PCT: 0 %
Eosinophils Absolute: 0 10*3/uL (ref 0.0–0.5)
HCT: 32.3 % — ABNORMAL LOW (ref 39.0–52.0)
HEMOGLOBIN: 10.8 g/dL — AB (ref 13.0–17.0)
IMMATURE GRANULOCYTES: 1 %
LYMPHS ABS: 1.8 10*3/uL (ref 0.7–4.0)
LYMPHS PCT: 12 %
MCH: 28.3 pg (ref 26.0–34.0)
MCHC: 33.4 g/dL (ref 30.0–36.0)
MCV: 84.6 fL (ref 80.0–100.0)
Monocytes Absolute: 1 10*3/uL (ref 0.1–1.0)
Monocytes Relative: 6 %
NEUTROS ABS: 12.5 10*3/uL — AB (ref 1.7–7.7)
NRBC: 0 % (ref 0.0–0.2)
Neutrophils Relative %: 81 %
Platelets: 239 10*3/uL (ref 150–400)
RBC: 3.82 MIL/uL — ABNORMAL LOW (ref 4.22–5.81)
RDW: 14 % (ref 11.5–15.5)
WBC: 15.5 10*3/uL — ABNORMAL HIGH (ref 4.0–10.5)

## 2017-12-26 LAB — URINALYSIS, ROUTINE W REFLEX MICROSCOPIC
BILIRUBIN URINE: NEGATIVE
Glucose, UA: NEGATIVE mg/dL
Hgb urine dipstick: NEGATIVE
Ketones, ur: NEGATIVE mg/dL
LEUKOCYTES UA: NEGATIVE
NITRITE: NEGATIVE
Protein, ur: NEGATIVE mg/dL
SPECIFIC GRAVITY, URINE: 1.005 (ref 1.005–1.030)
pH: 7 (ref 5.0–8.0)

## 2017-12-26 LAB — C-REACTIVE PROTEIN: CRP: 26.5 mg/dL — AB (ref <1.0)

## 2017-12-26 MED ORDER — BISACODYL 10 MG RE SUPP
10.0000 mg | Freq: Once | RECTAL | Status: DC
  Filled 2017-12-26: qty 1

## 2017-12-26 MED ORDER — OXYCODONE HCL 5 MG PO TABS
5.0000 mg | ORAL_TABLET | ORAL | Status: DC | PRN
  Administered 2017-12-26 – 2017-12-27 (×6): 5 mg via ORAL
  Filled 2017-12-26 (×6): qty 1

## 2017-12-26 NOTE — Progress Notes (Signed)
Entered patient's room to recheck temperature patient had removed cpap mask reminded patient that he would need to continue to use cpap for the night ,temperature taken and had decrease to 99.5.Cpap mask replaced by patient.

## 2017-12-26 NOTE — Telephone Encounter (Signed)
Please advise 

## 2017-12-26 NOTE — Progress Notes (Signed)
Patient's temperature still elevated to 102.3,M.D. Called  And notified.M.D wrote orders Ibuprofen given as ordered cpap machine put on by respiratory per M.D orders.

## 2017-12-26 NOTE — Progress Notes (Signed)
Physical Therapy Treatment Patient Details Name: Justin [name] MRN: [id number] DOB: 1965-11-20 Today's Date: 12/26/2017    History of Present Illness Pt is a 52 yo male who presents for L5-S1 fusion due to spinal stenosis and worsening radiculopathy RLE. PMH: bipolar, sleep apnea, L achilles tendon rupture, PE.    PT Comments    Patient seen for mobility progression. Pt is making progress toward PT goals tolerated increased gait distance with no c/o increased pain. Pt does continue to require min A for side lying to sit from flat bed, for functional transfers, and gait training. Continue to progress as tolerated with anticipated d/c to SNF for further skilled PT services.     Follow Up Recommendations  SNF;Supervision/Assistance - 24 hour     Equipment Recommendations  Rolling walker with 5" wheels    Recommendations for Other Services       Precautions / Restrictions Precautions Precautions: Back Precaution Comments: pt able to recall 3/3 precautions Required Braces or Orthoses: Spinal Brace Spinal Brace: Applied in standing position;Lumbar corset    Mobility  Bed Mobility Overal bed mobility: Needs Assistance Bed Mobility: Rolling;Sidelying to Sit Rolling: Min guard Sidelying to sit: Min assist       General bed mobility comments: min A to elevate trunk from flat bed; cues for technique with carry over demonstrated  Transfers Overall transfer level: Needs assistance Equipment used: Rolling walker (2 wheeled) Transfers: Sit to/from Stand Sit to Stand: Min assist         General transfer comment: assis to stabilize RW as pt relies on RW to pull up into standing  Ambulation/Gait Ambulation/Gait assistance: Editor, commissioning (Feet): (hallway ambualtion) Assistive device: Rolling walker (2 wheeled) Gait Pattern/deviations: Step-through pattern;Decreased stride length;Decreased dorsiflexion - right Gait velocity: decreased Gait velocity interpretation:  <1.8 ft/sec, indicate of risk for recurrent falls General Gait Details: cues for increased stride length/cadence and posture; heavy reliance on RW    Stairs             Wheelchair Mobility    Modified Rankin (Stroke Patients Only)       Balance Overall balance assessment: Mild deficits observed, not formally tested                                          Cognition Arousal/Alertness: Awake/alert Behavior During Therapy: WFL for tasks assessed/performed Overall Cognitive Status: Within Functional Limits for tasks assessed                                        Exercises      General Comments General comments (skin integrity, edema, etc.): dressing dry and intact       Pertinent Vitals/Pain Pain Assessment: Faces Faces Pain Scale: Hurts little more Pain Location: incision site Pain Descriptors / Indicators: Constant;Grimacing;Guarding Pain Intervention(s): Limited activity within patient's tolerance;Monitored during session;Repositioned    Home Living                      Prior Function            PT Goals (current goals can now be found in the care plan section) Progress towards PT goals: Progressing toward goals    Frequency    Min 5X/week      PT  Plan Current plan remains appropriate    Co-evaluation              AM-PAC PT "6 Clicks" Daily Activity  Outcome Measure  Difficulty turning over in bed (including adjusting bedclothes, sheets and blankets)?: Unable Difficulty moving from lying on back to sitting on the side of the bed? : Unable Difficulty sitting down on and standing up from a chair with arms (e.g., wheelchair, bedside commode, etc,.)?: Unable Help needed moving to and from a bed to chair (including a wheelchair)?: A Little Help needed walking in hospital room?: A Little Help needed climbing 3-5 steps with a railing? : A Lot 6 Click Score: 11    End of Session Equipment Utilized  During Treatment: Gait belt;Back brace Activity Tolerance: Patient tolerated treatment well Patient left: with call bell/phone within reach;in chair Nurse Communication: Mobility status PT Visit Diagnosis: Pain;Difficulty in walking, not elsewhere classified (R26.2) Pain - part of body: (back )     Time: 2130-8657 PT Time Calculation (min) (ACUTE ONLY): 35 min  Charges:  $Gait Training: 8-22 mins $Therapeutic Activity: 8-22 mins                     Erline Levine, PTA Acute Rehabilitation Services Pager: [phone] Office: [phone]     Carolynne Edouard 12/26/2017, 9:55 AM

## 2017-12-26 NOTE — Progress Notes (Signed)
Patient ID: Justin [name], male   DOB: 1966/02/22, 52 y.o.   MRN: [id number] Needs clean catch UA, ordered last night. Likely SNF Friday.

## 2017-12-26 NOTE — Progress Notes (Signed)
   Subjective: 3 Days Post-Op Procedure(s): L5 GILL PROCEDURE, RIGHT L5-S1, TRANSFORAMIAL LUMBAR INTERBODY FUSION, BILATERAL LATERAL FUSION, PEDICLE INSTRUMENTATION Patient reports pain as moderate.    Objective: Vital signs in last 24 hours: Temp:  [98.5 F (36.9 C)-102.8 F (39.3 C)] 99.6 F (37.6 C) (10/10 0400) Pulse Rate:  [96-112] 96 (10/10 0400) Resp:  [18-24] 21 (10/10 0400) BP: (109-137)/(77-88) 122/80 (10/10 0400) SpO2:  [89 %-96 %] 96 % (10/10 0400)  Intake/Output from previous day: 10/09 0701 - 10/10 0700 In: 600 [P.O.:600] Out: 2800 [Urine:2800] Intake/Output this shift: No intake/output data recorded.  Recent Labs    12/25/17 2341  HGB 10.8*   Recent Labs    12/25/17 2341  WBC 15.5*  RBC 3.82*  HCT 32.3*  PLT 239   Recent Labs    12/25/17 2341  NA 137  K 3.8  CL 102  CO2 23  BUN 9  CREATININE 1.39*  GLUCOSE 160*  CALCIUM 8.6*   No results for input(s): LABPT, INR in the last 72 hours.  Neurologically intact. Doppler test neg.  Dg Abd 1 View  Result Date: 12/26/2017 CLINICAL DATA:  Postoperative fever. Lumbar spinal fusion. EXAM: ABDOMEN - 1 VIEW COMPARISON:  None. FINDINGS: Postoperative changes with skin clips along the midline and surgical hardware at the lumbosacral interspace. Inferior vena caval filter is present. Gas-filled nondistended small and large bowel likely to represent ileus. No radiopaque stones. IMPRESSION: Postoperative changes in the lumbosacral spine. Gas-filled small and large bowel without significant distention likely representing ileus. Electronically Signed   By: Burman Nieves M.D.   On: 12/26/2017 00:35   Dg Chest Port 1 View  Result Date: 12/26/2017 CLINICAL DATA:  Postoperative fever. History of pneumonia. Post lumbar spine fusion. EXAM: PORTABLE CHEST 1 VIEW COMPARISON:  08/21/2017 FINDINGS: Shallow inspiration with elevation of the right hemidiaphragm. Heart size and pulmonary vascularity are normal for  technique. Atelectasis or fibrosis in the lung bases. No developing consolidation or edema. No blunting of costophrenic angles. No pneumothorax. Mediastinal contours appear intact. IMPRESSION: Shallow inspiration with atelectasis or fibrosis in the lung bases. No developing consolidation or edema. Electronically Signed   By: Burman Nieves M.D.   On: 12/26/2017 00:33    Assessment/Plan: 3 Days Post-Op Procedure(s): L5 GILL PROCEDURE, RIGHT L5-S1, TRANSFORAMIAL LUMBAR INTERBODY FUSION, BILATERAL LATERAL FUSION, PEDICLE INSTRUMENTATION  Plan:  Decrease po narcotics to one po q 4 hrs.  Atelestasis on CXR. Needs more ambulation. SNF.   UA pending .dulcolax supp . No BM post op.   Justin [name] 12/26/2017, 7:09 AM

## 2017-12-26 NOTE — Progress Notes (Signed)
Patient has temperature of 102.8,tylenol 650 mg given and incentive spirometer  Used by patient to attempt to bring down temperature.

## 2017-12-26 NOTE — Telephone Encounter (Signed)
Received call from Nurse at [facility] on 5N31 wants to know if can swab pt for FLU, states he is having increased temps and severe chills (pt has 5 blankets on him)  PLease advise  CB: [phone]

## 2017-12-26 NOTE — Telephone Encounter (Signed)
I called discussed.  

## 2017-12-27 ENCOUNTER — Inpatient Hospital Stay (INDEPENDENT_AMBULATORY_CARE_PROVIDER_SITE_OTHER): Payer: Medicaid Other | Admitting: Orthopaedic Surgery

## 2017-12-27 LAB — CBC
HEMATOCRIT: 33.2 % — AB (ref 39.0–52.0)
HEMOGLOBIN: 10.6 g/dL — AB (ref 13.0–17.0)
MCH: 27 pg (ref 26.0–34.0)
MCHC: 31.9 g/dL (ref 30.0–36.0)
MCV: 84.7 fL (ref 80.0–100.0)
NRBC: 0 % (ref 0.0–0.2)
Platelets: 242 10*3/uL (ref 150–400)
RBC: 3.92 MIL/uL — ABNORMAL LOW (ref 4.22–5.81)
RDW: 13.9 % (ref 11.5–15.5)
WBC: 12.5 10*3/uL — AB (ref 4.0–10.5)

## 2017-12-27 LAB — BASIC METABOLIC PANEL
Anion gap: 8 (ref 5–15)
BUN: 7 mg/dL (ref 6–20)
CHLORIDE: 104 mmol/L (ref 98–111)
CO2: 26 mmol/L (ref 22–32)
Calcium: 8.7 mg/dL — ABNORMAL LOW (ref 8.9–10.3)
Creatinine, Ser: 1.18 mg/dL (ref 0.61–1.24)
GFR calc non Af Amer: >60 mL/min (ref >60)
Glucose, Bld: 122 mg/dL — ABNORMAL HIGH (ref 70–99)
POTASSIUM: 3.8 mmol/L (ref 3.5–5.1)
Sodium: 138 mmol/L (ref 135–145)

## 2017-12-27 MED ORDER — METHOCARBAMOL 500 MG PO TABS: 500.0000 mg | ORAL_TABLET | Freq: Four times a day (QID) | ORAL | 0 refills | Status: DC | PRN

## 2017-12-27 MED ORDER — OXYCODONE-ACETAMINOPHEN 5-325 MG PO TABS: 1.0000 | ORAL_TABLET | Freq: Four times a day (QID) | ORAL | 0 refills | Status: DC | PRN

## 2017-12-27 NOTE — Progress Notes (Signed)
Patient will DC to: [facility] Anticipated DC date: 12/27/17 Family notified:[place] Transport by: Sharin Mons  Per MD patient ready for DC to [facility], [place] . RN, patient, patient's family, and facility notified of DC. Discharge Summary sent to facility. RN given number for report [phone] [address] DC packet on chart. Ambulance transport requested for patient for 3:00 pm per nurse request.  CSW signing off.  [place], [place] [phone]

## 2017-12-27 NOTE — Progress Notes (Signed)
RN called Dr. Ophelia Charter about pt's temp. Pt is using incentive spirometer, getting up to 2250, he has also walked in the hall with staff members. MD okay with pt still leaving today with instructions to continue to get up and walk as much as possible, continue using incentive and limit narcotic pain medications. Pt informed by RN of instructions and verbalized understanding.

## 2017-12-27 NOTE — Clinical Social Work Placement (Signed)
   CLINICAL SOCIAL WORK PLACEMENT  NOTE  Date:  12/27/2017  Patient Details  Name: Justin [name] MRN: [id number] Date of Birth: 07-08-1965  Clinical Social Work is seeking post-discharge placement for this patient at the Skilled  Nursing Facility level of care (*CSW will initial, date and re-position this form in  chart as items are completed):  Yes   Patient/family provided with [facility] Clinical Social Work Department's list of facilities offering this level of care within the geographic area requested by the patient (or if unable, by the patient's family).  Yes   Patient/family informed of their freedom to choose among providers that offer the needed level of care, that participate in Medicare, Medicaid or managed care program needed by the patient, have an available bed and are willing to accept the patient.      Patient/family informed of [facility]'s ownership interest in [facility] and [facility], as well as of the fact that they are under no obligation to receive care at these facilities.  PASRR submitted to EDS on       PASRR number received on 12/25/17     Existing PASRR number confirmed on       FL2 transmitted to all facilities in geographic area requested by pt/family on 12/25/17     FL2 transmitted to all facilities within larger geographic area on       Patient informed that his/her managed care company has contracts with or will negotiate with certain facilities, including the following:        Yes   Patient/family informed of bed offers received.  Patient chooses bed at [facility]     Physician recommends and patient chooses bed at      Patient to be transferred to [facility] on 12/27/17.  Patient to be transferred to facility by [facility]     Patient family notified on 12/27/17 of transfer.  Name of family member notified:  Servando Snare (sister)     PHYSICIAN       Additional Comment:     _______________________________________________ Gildardo Griffes, LCSW 12/27/2017, 12:50 PM

## 2017-12-27 NOTE — Progress Notes (Signed)
Physical Therapy Treatment Patient Details Name: Justin [name] MRN: [id number] DOB: 19-Nov-1965 Today's Date: 12/27/2017    History of Present Illness Pt is a 52 yo male who presents for L5-S1 fusion due to spinal stenosis and worsening radiculopathy RLE. PMH: bipolar, sleep apnea, L achilles tendon rupture, PE.    PT Comments    Patient continues to make progress toward PT goals. Pt tolerated increased gait distance but does demonstrate unsteadiness with turning and stepping backwards with no overt LOB. Continue to progress as tolerated.    Follow Up Recommendations  SNF;Supervision/Assistance - 24 hour     Equipment Recommendations  Rolling walker with 5" wheels    Recommendations for Other Services       Precautions / Restrictions Precautions Precautions: Back Precaution Comments: pt able to recall 3/3 precautions Required Braces or Orthoses: Spinal Brace Spinal Brace: Applied in standing position;Lumbar corset    Mobility  Bed Mobility Overal bed mobility: Needs Assistance Bed Mobility: Rolling;Sidelying to Sit Rolling: Supervision Sidelying to sit: Min guard       General bed mobility comments: use of rail; pt maintained back precautions throughout  Transfers Overall transfer level: Needs assistance Equipment used: Rolling walker (2 wheeled) Transfers: Sit to/from Stand Sit to Stand: Min assist         General transfer comment: unsteady when stepping backwards to chair; assist to stabilize RW; cues for safe hand placement when standing and sitting as pt relies heavily on RW  Ambulation/Gait Ambulation/Gait assistance: Min guard Gait Distance (Feet): 200 Feet Assistive device: Rolling walker (2 wheeled) Gait Pattern/deviations: Step-through pattern;Decreased stride length;Decreased dorsiflexion - right Gait velocity: decreased   General Gait Details: unsteady when turning but no overt LOB; Pt continues to demonstrate R LE weakness with gait but is  improving with posture, cadence, and distance     Stairs             Wheelchair Mobility    Modified Rankin (Stroke Patients Only)       Balance Overall balance assessment: Mild deficits observed, not formally tested                                          Cognition Arousal/Alertness: Awake/alert Behavior During Therapy: WFL for tasks assessed/performed Overall Cognitive Status: Within Functional Limits for tasks assessed                                        Exercises General Exercises - Lower Extremity Mini-Sqauts: Standing;5 reps    General Comments General comments (skin integrity, edema, etc.): dressing dry and intact; pt c/o numbness on dorsal side of R foot      Pertinent Vitals/Pain Pain Assessment: Faces Faces Pain Scale: Hurts a little bit Pain Location: incision site Pain Descriptors / Indicators: Constant;Guarding Pain Intervention(s): Limited activity within patient's tolerance;Monitored during session;Repositioned    Home Living                      Prior Function            PT Goals (current goals can now be found in the care plan section) Progress towards PT goals: Progressing toward goals    Frequency    Min 5X/week      PT Plan Current plan remains  appropriate    Co-evaluation              AM-PAC PT "6 Clicks" Daily Activity  Outcome Measure  Difficulty turning over in bed (including adjusting bedclothes, sheets and blankets)?: Unable Difficulty moving from lying on back to sitting on the side of the bed? : Unable Difficulty sitting down on and standing up from a chair with arms (e.g., wheelchair, bedside commode, etc,.)?: Unable Help needed moving to and from a bed to chair (including a wheelchair)?: A Little Help needed walking in hospital room?: A Little Help needed climbing 3-5 steps with a railing? : A Lot 6 Click Score: 11    End of Session Equipment Utilized During  Treatment: Gait belt;Back brace Activity Tolerance: Patient tolerated treatment well Patient left: with call bell/phone within reach;in chair Nurse Communication: Mobility status PT Visit Diagnosis: Pain;Difficulty in walking, not elsewhere classified (R26.2) Pain - part of body: (back )     Time: 8377-9396 PT Time Calculation (min) (ACUTE ONLY): 23 min  Charges:  $Gait Training: 8-22 mins $Therapeutic Activity: 8-22 mins                     Erline Levine, PTA Acute Rehabilitation Services Pager: [phone] Office: [phone]     Carolynne Edouard 12/27/2017, 10:18 AM

## 2017-12-27 NOTE — Discharge Instructions (Signed)
Ok to shower 5 days postop.  Do not apply any creams or ointments to incision.  Do not remove steri-strips.  Can use 4x4 gauze and tape for dressing changes.  No aggressive activity.  No bending, squatting or prolonged sitting.  Mostly be in reclined position or lying down.    Must wear brace at all times when up and ambulating.   RN to perform daily wound checks and dressing changes.

## 2017-12-27 NOTE — Discharge Summary (Signed)
Patient ID: Justin [name] MRN: [id number] DOB/AGE: 1965-03-31 52 y.o.  Admit date: 12/23/2017 Discharge date: 12/27/2017  Admission Diagnoses:  Active Problems:   Lumbar stenosis   Discharge Diagnoses:  Active Problems:   Lumbar stenosis  status post Procedure(s): L5 GILL PROCEDURE, RIGHT L5-S1, TRANSFORAMIAL LUMBAR INTERBODY FUSION, BILATERAL LATERAL FUSION, PEDICLE INSTRUMENTATION  Past Medical History:  Diagnosis Date  . Achilles rupture, left   . Bipolar 1 disorder (HCC)   . Dental caries    periodontitis  . Pneumonia   . Pulmonary embolism (HCC)   . Sleep apnea    wears CPAP  . Subdural hematoma (HCC) 2015    Surgeries: Procedure(s): L5 GILL PROCEDURE, RIGHT L5-S1, TRANSFORAMIAL LUMBAR INTERBODY FUSION, BILATERAL LATERAL FUSION, PEDICLE INSTRUMENTATION on 12/23/2017   Consultants:   Discharged Condition: Improved  Hospital Course: Justin [name] is an 52 y.o. male who was admitted 12/23/2017 for operative treatment of lumbar stenosis. Patient failed conservative treatments (please see the history and physical for the specifics) and had severe unremitting pain that affects sleep, daily activities and work/hobbies. After pre-op clearance, the patient was taken to the operating room on 12/23/2017 and underwent  Procedure(s): L5 GILL PROCEDURE, RIGHT L5-S1, TRANSFORAMIAL LUMBAR INTERBODY FUSION, BILATERAL LATERAL FUSION, PEDICLE INSTRUMENTATION.    Patient was given perioperative antibiotics:  Anti-infectives (From admission, onward)   Start     Dose/Rate Route Frequency Ordered Stop   12/23/17 2200  ceFAZolin (ANCEF) IVPB 2g/100 mL premix     2 g 200 mL/hr over 30 Minutes Intravenous Every 8 hours 12/23/17 2015 12/24/17 0633   12/23/17 1215  ceFAZolin (ANCEF) IVPB 2g/100 mL premix     2 g 200 mL/hr over 30 Minutes Intravenous On call to O.R. 12/23/17 0837 12/23/17 1356       Patient was given sequential compression devices and early ambulation to prevent DVT.    Patient benefited maximally from hospital stay and there were no complications. At the time of discharge, the patient was urinating/moving their bowels without difficulty, tolerating a regular diet, pain is controlled with oral pain medications and they have been cleared by PT/OT.   Recent vital signs:  Patient Vitals for the past 24 hrs:  BP Temp Temp src Pulse Resp SpO2  12/27/17 1012 - 98 F (36.7 C) Oral - - -  12/27/17 0529 119/87 99.5 F (37.5 C) Oral 99 15 93 %  12/27/17 0239 - 100.2 F (37.9 C) Oral - - -  12/26/17 2008 126/85 98.9 F (37.2 C) Oral 95 14 100 %  12/26/17 1727 - 99.3 F (37.4 C) Oral - - -  12/26/17 1619 - 99.2 F (37.3 C) Oral - - -  12/26/17 1403 128/82 (!) 101.7 F (38.7 C) Oral (!) 101 18 97 %     Recent laboratory studies:  Recent Labs    12/25/17 2341 12/27/17 0116  WBC 15.5* 12.5*  HGB 10.8* 10.6*  HCT 32.3* 33.2*  PLT 239 242  NA 137 138  K 3.8 3.8  CL 102 104  CO2 23 26  BUN 9 7  CREATININE 1.39* 1.18  GLUCOSE 160* 122*  CALCIUM 8.6* 8.7*     Discharge Medications:   Allergies as of 12/27/2017   No Known Allergies     Medication List    STOP taking these medications   acetaminophen 500 MG tablet Commonly known as:  TYLENOL   aspirin EC 81 MG tablet   cyclobenzaprine 10 MG tablet Commonly known as:  FLEXERIL  meloxicam 15 MG tablet Commonly known as:  MOBIC   naproxen 500 MG tablet Commonly known as:  NAPROSYN   Olopatadine HCl 0.2 % Soln   traMADol 50 MG tablet Commonly known as:  ULTRAM     TAKE these medications   doxepin 10 MG capsule Commonly known as:  SINEQUAN Take 20 mg by mouth at bedtime.   hydrOXYzine 25 MG tablet Commonly known as:  ATARAX/VISTARIL Take 25 mg by mouth 3 (three) times daily as needed for anxiety.   lithium 600 MG capsule Take 600 mg by mouth at bedtime.   methocarbamol 500 MG tablet Commonly known as:  ROBAXIN Take 1 tablet (500 mg total) by mouth every 6 (six) hours as  needed for muscle spasms. What changed:    when to take this  reasons to take this   mirtazapine 45 MG tablet Commonly known as:  REMERON Take 45 mg by mouth at bedtime.   oxyCODONE-acetaminophen 5-325 MG tablet Commonly known as:  PERCOCET/ROXICET Take 1-2 tablets by mouth every 6 (six) hours as needed for severe pain. What changed:    how much to take  when to take this   QUEtiapine 400 MG 24 hr tablet Commonly known as:  SEROQUEL XR Take 800 mg by mouth at bedtime.       Diagnostic Studies: Dg Lumbar Spine 2-3 Views  Result Date: 12/23/2017 CLINICAL DATA:  L5 Gill procedure, RIGHT L5-S1 TLIF EXAM: LUMBAR SPINE - 2-3 VIEW; DG C-ARM 61-120 MIN COMPARISON:  Preoperative radiograph 09/26/2017 FINDINGS: Preoperative exam demonstrates 5 lumbar vertebra. BILATERAL pedicle screws and posterior bars at L5-S1 with an intervening disc prosthesis. Bones appear demineralized. No fracture or subluxation. IMPRESSION: Posterior fusion L5-S1. Electronically Signed   By: Ulyses Southward M.D.   On: 12/23/2017 17:19   Dg Abd 1 View  Result Date: 12/26/2017 CLINICAL DATA:  Postoperative fever. Lumbar spinal fusion. EXAM: ABDOMEN - 1 VIEW COMPARISON:  None. FINDINGS: Postoperative changes with skin clips along the midline and surgical hardware at the lumbosacral interspace. Inferior vena caval filter is present. Gas-filled nondistended small and large bowel likely to represent ileus. No radiopaque stones. IMPRESSION: Postoperative changes in the lumbosacral spine. Gas-filled small and large bowel without significant distention likely representing ileus. Electronically Signed   By: Burman Nieves M.D.   On: 12/26/2017 00:35   Dg Chest Port 1 View  Result Date: 12/26/2017 CLINICAL DATA:  Postoperative fever. History of pneumonia. Post lumbar spine fusion. EXAM: PORTABLE CHEST 1 VIEW COMPARISON:  08/21/2017 FINDINGS: Shallow inspiration with elevation of the right hemidiaphragm. Heart size and  pulmonary vascularity are normal for technique. Atelectasis or fibrosis in the lung bases. No developing consolidation or edema. No blunting of costophrenic angles. No pneumothorax. Mediastinal contours appear intact. IMPRESSION: Shallow inspiration with atelectasis or fibrosis in the lung bases. No developing consolidation or edema. Electronically Signed   By: Burman Nieves M.D.   On: 12/26/2017 00:33   Dg C-arm 1-60 Min  Result Date: 12/23/2017 CLINICAL DATA:  L5 Gill procedure, RIGHT L5-S1 TLIF EXAM: LUMBAR SPINE - 2-3 VIEW; DG C-ARM 61-120 MIN COMPARISON:  Preoperative radiograph 09/26/2017 FINDINGS: Preoperative exam demonstrates 5 lumbar vertebra. BILATERAL pedicle screws and posterior bars at L5-S1 with an intervening disc prosthesis. Bones appear demineralized. No fracture or subluxation. IMPRESSION: Posterior fusion L5-S1. Electronically Signed   By: Ulyses Southward M.D.   On: 12/23/2017 17:19   Dg C-arm 1-60 Min  Result Date: 12/23/2017 CLINICAL DATA:  L5 Gill procedure, RIGHT L5-S1  TLIF EXAM: LUMBAR SPINE - 2-3 VIEW; DG C-ARM 61-120 MIN COMPARISON:  Preoperative radiograph 09/26/2017 FINDINGS: Preoperative exam demonstrates 5 lumbar vertebra. BILATERAL pedicle screws and posterior bars at L5-S1 with an intervening disc prosthesis. Bones appear demineralized. No fracture or subluxation. IMPRESSION: Posterior fusion L5-S1. Electronically Signed   By: Ulyses Southward M.D.   On: 12/23/2017 17:19   Dg C-arm 1-60 Min  Result Date: 12/23/2017 CLINICAL DATA:  L5 Gill procedure, RIGHT L5-S1 TLIF EXAM: LUMBAR SPINE - 2-3 VIEW; DG C-ARM 61-120 MIN COMPARISON:  Preoperative radiograph 09/26/2017 FINDINGS: Preoperative exam demonstrates 5 lumbar vertebra. BILATERAL pedicle screws and posterior bars at L5-S1 with an intervening disc prosthesis. Bones appear demineralized. No fracture or subluxation. IMPRESSION: Posterior fusion L5-S1. Electronically Signed   By: Ulyses Southward M.D.   On: 12/23/2017 17:19       Follow-up Information    Eldred Manges, MD. Schedule an appointment as soon as possible for a visit today.   Specialty:  Orthopedic Surgery Why:  need return office visit 2 weeks postop Contact information: [address] [place] [address] [phone]           Discharge Plan:  discharge to snf  Disposition:     Signed: Zonia Kief for Annell Greening MD 12/27/2017, 11:36 AM

## 2017-12-27 NOTE — Progress Notes (Signed)
RN called report to [place] at [facility]. Answered all questions to satisfaction. Belongings gathered to be sent home. Pt waiting on PTAR for transport.

## 2017-12-27 NOTE — Progress Notes (Signed)
Subjective: Doing well.  preop symptoms improved.  Ready for transfer to SNF.     Objective: Vital signs in last 24 hours: Temp:  [98 F (36.7 C)-101.7 F (38.7 C)] 98 F (36.7 C) (10/11 1012) Pulse Rate:  [95-101] 99 (10/11 0529) Resp:  [14-18] 15 (10/11 0529) BP: (119-128)/(82-87) 119/87 (10/11 0529) SpO2:  [93 %-100 %] 93 % (10/11 0529)  Intake/Output from previous day: 10/10 0701 - 10/11 0700 In: 2160 [P.O.:2160] Out: 2600 [Urine:2600] Intake/Output this shift: Total I/O In: 240 [P.O.:240] Out: 825 [Urine:825]  Recent Labs    12/25/17 2341 12/27/17 0116  HGB 10.8* 10.6*   Recent Labs    12/25/17 2341 12/27/17 0116  WBC 15.5* 12.5*  RBC 3.82* 3.92*  HCT 32.3* 33.2*  PLT 239 242   Recent Labs    12/25/17 2341 12/27/17 0116  NA 137 138  K 3.8 3.8  CL 102 104  CO2 23 26  BUN 9 7  CREATININE 1.39* 1.18  GLUCOSE 160* 122*  CALCIUM 8.6* 8.7*   No results for input(s): LABPT, INR in the last 72 hours.  Exam Patient alert and oriented. NAD.  Wound looks good.  No drainage or signs of infection.  Neurologically intact.       Assessment/Plan: Transfer to SNF today.  Scripts on chart for percocet and robaxin. Follow up in office 2 weeks postop.    Justin [name] 12/27/2017, 11:47 AM

## 2017-12-27 NOTE — Progress Notes (Signed)
RT offered to assist pt with CPAP pt stated he did not want to wear it tonight. Pt order is for PRN CPAP. RT will continue to monitor.

## 2017-12-30 NOTE — Anesthesia Postprocedure Evaluation (Signed)
Anesthesia Post Note  Patient: Justin [name]  Procedure(s) Performed: L5 GILL PROCEDURE, RIGHT L5-S1, TRANSFORAMIAL LUMBAR INTERBODY FUSION, BILATERAL LATERAL FUSION, PEDICLE INSTRUMENTATION (Spine Lumbar)     Patient location during evaluation: PACU Anesthesia Type: General Level of consciousness: awake and sedated Pain management: pain level controlled Vital Signs Assessment: post-procedure vital signs reviewed and stable Respiratory status: spontaneous breathing Cardiovascular status: stable Postop Assessment: no apparent nausea or vomiting Anesthetic complications: no    Last Vitals:  Vitals:   12/27/17 1604 12/27/17 1816  BP:    Pulse:    Resp:    Temp: 37.9 C 37.4 C  SpO2:      Last Pain:  Vitals:   12/27/17 1816  TempSrc: Oral  PainSc:    Pain Goal: Patients Stated Pain Goal: 3 (12/27/17 1530)               Jerimyah Vandunk JR,JOHN Susann Givens

## 2017-12-31 LAB — CULTURE, BLOOD (ROUTINE X 2)
CULTURE: NO GROWTH
CULTURE: NO GROWTH
SPECIAL REQUESTS: ADEQUATE
Special Requests: ADEQUATE

## 2018-01-03 ENCOUNTER — Ambulatory Visit (INDEPENDENT_AMBULATORY_CARE_PROVIDER_SITE_OTHER): Payer: Medicaid Other | Admitting: Orthopaedic Surgery

## 2018-01-03 ENCOUNTER — Encounter (INDEPENDENT_AMBULATORY_CARE_PROVIDER_SITE_OTHER): Payer: Self-pay | Admitting: Orthopaedic Surgery

## 2018-01-03 ENCOUNTER — Ambulatory Visit (INDEPENDENT_AMBULATORY_CARE_PROVIDER_SITE_OTHER): Payer: Medicaid Other

## 2018-01-03 VITALS — BP 130/86 | HR 94 | Ht 69.0 in | Wt 228.0 lb

## 2018-01-03 DIAGNOSIS — Z981 Arthrodesis status: Secondary | ICD-10-CM

## 2018-01-03 MED ORDER — TRAMADOL HCL 50 MG PO TABS: 50.0000 mg | ORAL_TABLET | Freq: Four times a day (QID) | ORAL | 0 refills | Status: DC | PRN

## 2018-01-03 NOTE — Progress Notes (Signed)
Post-Op Visit Note   Patient: Justin [name]           Date of Birth: 1965/06/12           MRN: [id number] Visit Date: 01/03/2018 PCP: [facility]   Assessment & Plan: Post Gill procedure L5-S1 fusion.  He is gotten good relief of his preop pain is using his brace using his walker.  He was living alone and went to skilled facility and is now ready to go back home.  Chief Complaint:  Chief Complaint  Patient presents with  . Lower Back - Routine Post Op    12/23/17 L5 Gill Procedure, Right L5-S1 TLIF, Bilateral Lateral Fusion, Pedicle Instrumentation   Visit Diagnoses:  1. Status post lumbar spinal fusion     Plan: He will continue his walking program continue his brace return in 2 months.  Prescription for Ultram supplied 40 tablets 1 p.o. twice daily as needed pain.  He is has good strength in his leg and is happy with the surgical result.  Follow-Up Instructions: No follow-ups on file.   Orders:  Orders Placed This Encounter  Procedures  . XR Lumbar Spine 2-3 Views   No orders of the defined types were placed in this encounter.   Imaging: No results found.  PMFS History: Patient Active Problem List   Diagnosis Date Noted  . Lumbar stenosis 12/23/2017  . Spondylolisthesis, lumbar region 06/25/2017  . Radicular pain of right lower extremity 06/25/2017  . Achilles tendinitis of left lower extremity 12/07/2016  . Achilles tendinitis, left leg 05/17/2016  . Obstructive sleep apnea on CPAP 03/07/2015  . Post-operative state 03/07/2015  . Achilles rupture, left 10/21/2014  . OSA (obstructive sleep apnea) 04/23/2013  . Bipolar disorder, unspecified (HCC) 01/07/2013  . Sinus tachycardia 01/07/2013  . Headache(784.0) 01/07/2013  . SDH (subdural hematoma) (HCC) 01/04/2013  . H/O PE 01/04/2013  . Chronic anticoagulation 01/04/2013  . 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  . Acute sinusitis 01/04/2013   Past Medical History:  Diagnosis Date  . Achilles  rupture, left   . Bipolar 1 disorder (HCC)   . Dental caries    periodontitis  . Pneumonia   . Pulmonary embolism (HCC)   . Sleep apnea    wears CPAP  . Subdural hematoma (HCC) 2015    Family History  Problem Relation Age of Onset  . Hypertension Mother   . Stroke Mother   . Multiple sclerosis Sister   . Down syndrome Son     Past Surgical History:  Procedure Laterality Date  . ACHILLES TENDON SURGERY Left 10/21/2014   Procedure: Left Achilles Reconstruction;  Surgeon: Nadara Mustard, MD;  Location: [facility];  Service: Orthopedics;  Laterality: Left;  . APPENDECTOMY    . CRANIOTOMY N/A 01/19/2013   Procedure: CRANIOTOMY HEMATOMA EVACUATION SUBDURAL;  Surgeon: Hewitt Shorts, MD;  Location: [facility] NEURO ORS;  Service: Neurosurgery;  Laterality: N/A;  . CYSTECTOMY     right head  . ELBOW SURGERY     right  . FRACTURE SURGERY     finger  . I&D EXTREMITY Left 12/07/2016   Procedure: LEFT ACHILLES DEBRIDEMENT;  Surgeon: Nadara Mustard, MD;  Location: [facility] OR;  Service: Orthopedics;  Laterality: Left;  . LUMBAR FUSION  12/23/2017   L5 GILL PROCEDURE, RIGHT L5-S1, TRANSFORAMIAL LUMBAR INTERBODY FUSION, BILATERAL LATERAL FUSION, PEDICLE INSTRUMENTATION  . MULTIPLE EXTRACTIONS WITH ALVEOLOPLASTY N/A 03/07/2015   Procedure: MULTIPLE EXTRACTION WITH ALVEOLOPLASTY;  Surgeon: [name], DDS;  Location: [facility] OR;  Service: Oral Surgery;  Laterality: N/A;   Social History   Occupational History  . Occupation: disability  Tobacco Use  . Smoking status: Never Smoker  . Smokeless tobacco: Never Used  Substance and Sexual Activity  . Alcohol use: No  . Drug use: No  . Sexual activity: Not on file

## 2018-01-06 ENCOUNTER — Encounter (INDEPENDENT_AMBULATORY_CARE_PROVIDER_SITE_OTHER): Payer: Self-pay | Admitting: Orthopaedic Surgery

## 2018-01-09 ENCOUNTER — Telehealth (INDEPENDENT_AMBULATORY_CARE_PROVIDER_SITE_OTHER): Payer: Self-pay

## 2018-01-09 NOTE — Telephone Encounter (Signed)
Please advise. Patient states numbness is since surgery. He did not mention in last office visit.

## 2018-01-09 NOTE — Telephone Encounter (Signed)
Its OK , will get better with time.

## 2018-01-09 NOTE — Telephone Encounter (Signed)
I called patient and advised. 

## 2018-01-09 NOTE — Telephone Encounter (Signed)
Patient called stating that he is having numbness and tingling in his Great toe and his 2nd toe on the right foot.  Patient had back surgery on 12/23/2017.  Stated that he noticed this after surgery.  CB# is [phone].  Please advise.  Thank you.

## 2018-01-21 ENCOUNTER — Telehealth (INDEPENDENT_AMBULATORY_CARE_PROVIDER_SITE_OTHER): Payer: Self-pay | Admitting: Orthopaedic Surgery

## 2018-01-21 NOTE — Telephone Encounter (Signed)
Patient called requesting a refill on his muscle relaxers (Roboxin) due to his muscles tightening up after going for a walk which he has been going for a walk about 3 to 4 times a day.  CB#[phone].  Thank you.

## 2018-01-22 MED ORDER — METHOCARBAMOL 500 MG PO TABS: 500.0000 mg | ORAL_TABLET | Freq: Three times a day (TID) | ORAL | 0 refills | Status: DC | PRN

## 2018-01-22 NOTE — Telephone Encounter (Signed)
Ok refill thanks 

## 2018-01-22 NOTE — Telephone Encounter (Signed)
Submitted to pharmacy. Advised patient done.  

## 2018-01-22 NOTE — Telephone Encounter (Signed)
Ok to rf? 

## 2018-01-31 IMAGING — DX DG CHEST 2V
2 series · 2 of 2 positions shown · non-contrast
Comparison: Chest radiograph 10/21/2014

CLINICAL DATA: Productive cough, chest pain and shortness of breath

EXAM:
CHEST  2 VIEW

[chest pa]
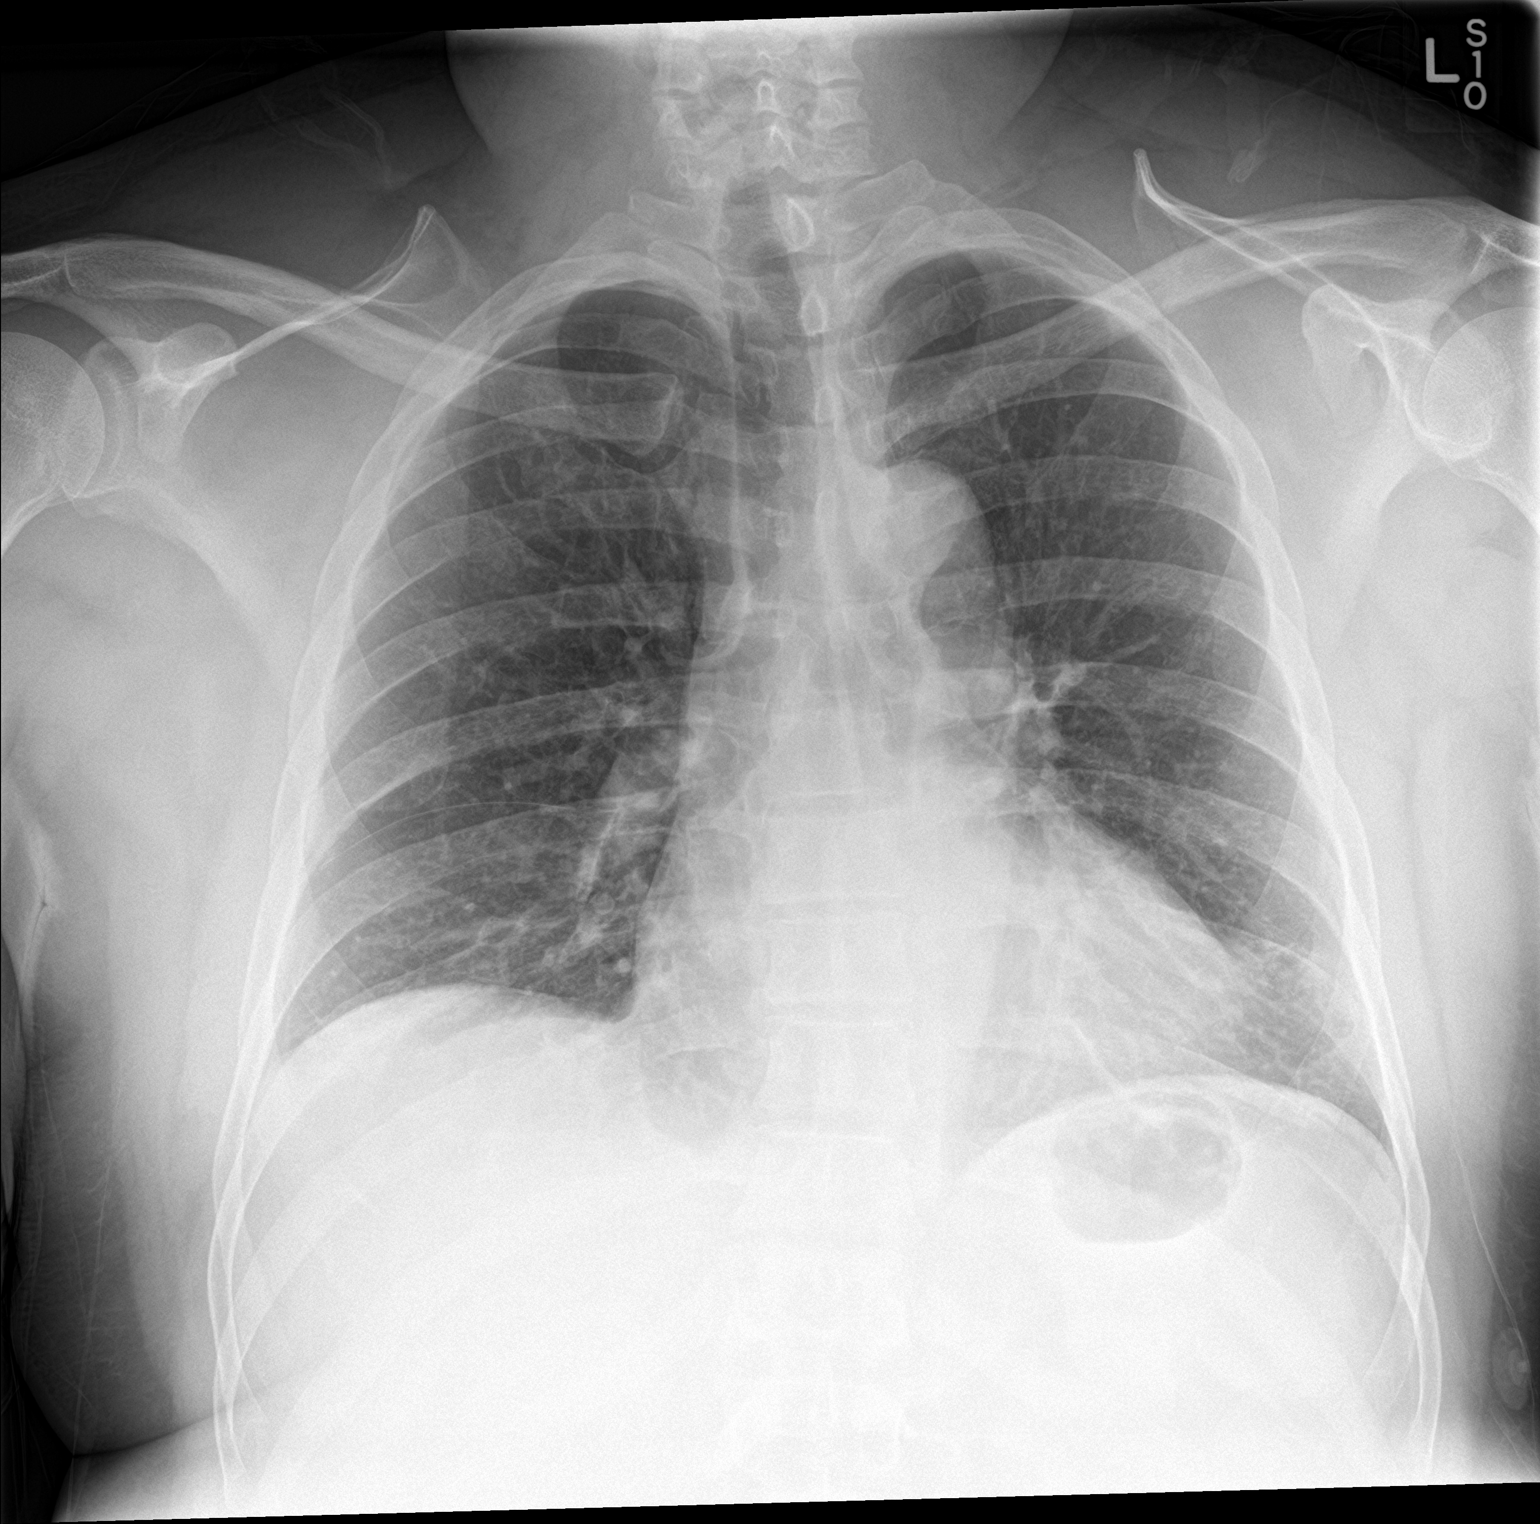

[chest lat]
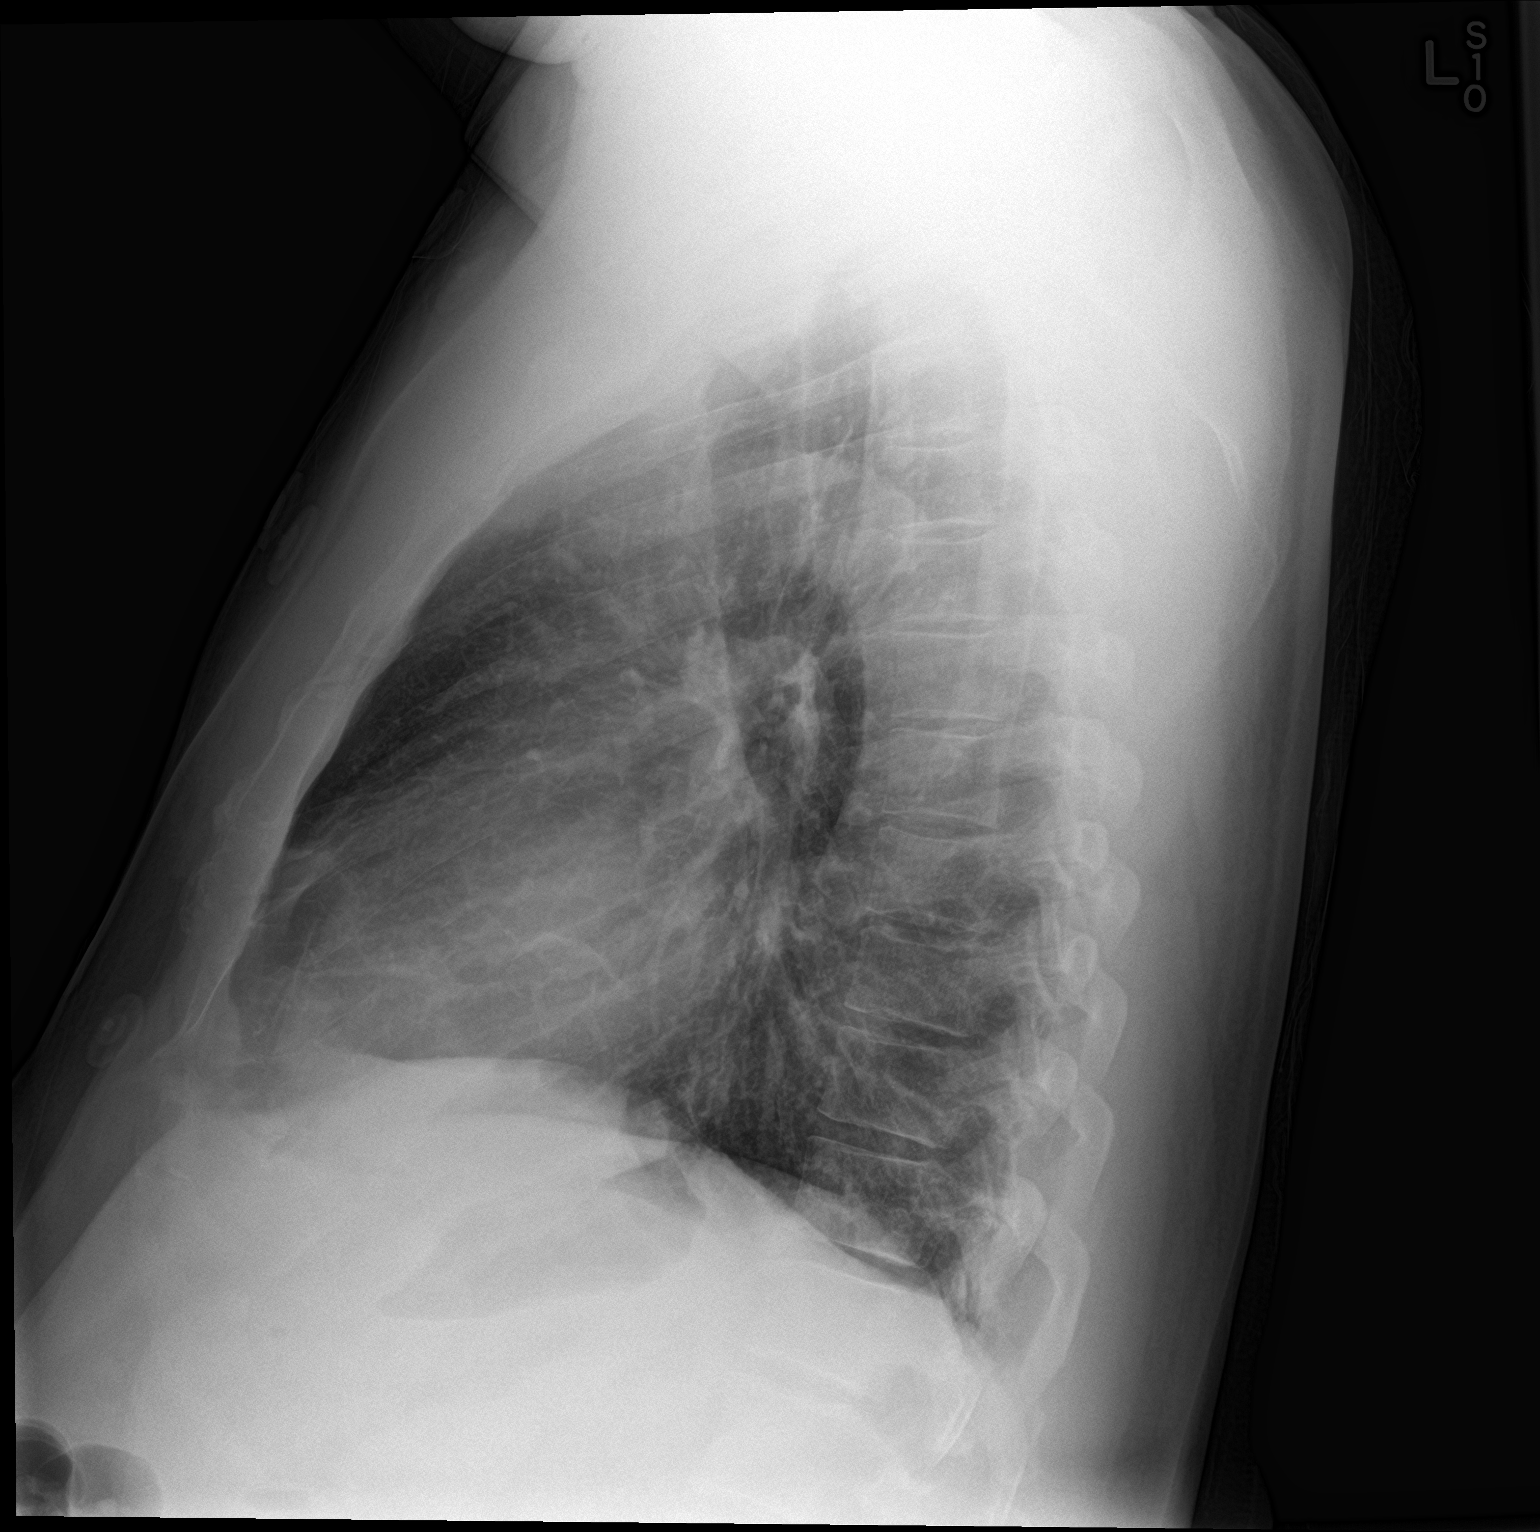

[2 of 2 positions shown; findings below may reference images not displayed]

FINDINGS: Cardiomediastinal contours are normal.  There is no pulmonary edema.

No pleural effusion or pneumothorax. Mild bibasilar atelectasis. No
focal consolidation.
IMPRESSION: No active cardiopulmonary disease.

## 2018-02-03 ENCOUNTER — Telehealth (INDEPENDENT_AMBULATORY_CARE_PROVIDER_SITE_OTHER): Payer: Self-pay | Admitting: Orthopaedic Surgery

## 2018-02-03 MED ORDER — HYDROCODONE-ACETAMINOPHEN 5-325 MG PO TABS: 1.0000 | ORAL_TABLET | Freq: Every evening | ORAL | 0 refills | Status: DC | PRN

## 2018-02-03 NOTE — Telephone Encounter (Signed)
I talked with him. norco 5/325 # 10 given. One po q HS , we discussed weaning. Been over one month since surgery. He rides the bus and walks. Having problems sleeping. Discussed length of time and # of tablets consumed from pre-op, surgery post op , SNF . F/U as planned. FYI

## 2018-02-03 NOTE — Telephone Encounter (Signed)
Please advise. Patient came into the office and was upset that no one was returning his call. There is not a message in the chart. He states that the tramadol he has is not helping his pain at all and requests something else.

## 2018-02-03 NOTE — Telephone Encounter (Signed)
Patient came into the office stating that his tramadol 50 mg isn't touching his pain.  Pt # [phone]

## 2018-02-03 NOTE — Telephone Encounter (Signed)
Patient came back into the office when he saw Dr. Ophelia Charter car pull through. Dr. Ophelia Charter saw patient in an exam room. Hydrocodone 5/325 1 po q hs #10 script written and given to patient.

## 2018-02-21 ENCOUNTER — Telehealth (INDEPENDENT_AMBULATORY_CARE_PROVIDER_SITE_OTHER): Payer: Self-pay | Admitting: Orthopaedic Surgery

## 2018-02-21 NOTE — Telephone Encounter (Signed)
Surgery 2 months ago. Use tylenol of advil. Heat , ice etc. Thanks time to be off muscle relaxants.

## 2018-02-21 NOTE — Telephone Encounter (Signed)
Please advise 

## 2018-02-21 NOTE — Telephone Encounter (Signed)
Patient called asked if he can get a muscle relaxer for his back. Patient said the pain is bad when he get up. Patient said he is in the bed now. The number to contact patient is [phone]

## 2018-02-21 NOTE — Telephone Encounter (Signed)
I called patient and advised. 

## 2018-03-04 HISTORY — PX: CARDIAC CATHETERIZATION: SHX172

## 2018-03-05 ENCOUNTER — Ambulatory Visit (INDEPENDENT_AMBULATORY_CARE_PROVIDER_SITE_OTHER): Payer: Medicaid Other | Admitting: Orthopaedic Surgery

## 2018-03-05 ENCOUNTER — Encounter (INDEPENDENT_AMBULATORY_CARE_PROVIDER_SITE_OTHER): Payer: Self-pay | Admitting: Orthopaedic Surgery

## 2018-03-05 VITALS — BP 126/89 | HR 92 | Ht 69.0 in | Wt 242.4 lb

## 2018-03-05 DIAGNOSIS — Z981 Arthrodesis status: Secondary | ICD-10-CM

## 2018-03-05 NOTE — Progress Notes (Signed)
Post-Op Visit Note   Patient: Justin [name]           Date of Birth: March 19, 1966           MRN: [id number] Visit Date: 03/05/2018 PCP: [facility], [facility]   Assessment & Plan: Follow-up L5-S1 Gill procedure fusion for spondylolisthesis with surgery date 12/23/2017.  He is doing his walking he has his brace on.  Leg strength is good.  We discussed continue walking working on some weight loss.  Reviewed the x-rays from last January and compared to the postop images with instrumented fusion.  Chief Complaint:  Chief Complaint  Patient presents with  . Lower Back - Pain   Visit Diagnoses:  1. Status post lumbar spinal fusion     Plan: Post lumbar fusion.  We will check him again in 2 months.  He will continue his walking program.  Follow-Up Instructions: Return in about 2 months (around 05/06/2018).   Orders:  No orders of the defined types were placed in this encounter.  No orders of the defined types were placed in this encounter.   Imaging: No results found.  PMFS History: Patient Active Problem List   Diagnosis Date Noted  . Status post lumbar spinal fusion 01/03/2018  . Lumbar stenosis 12/23/2017  . Spondylolisthesis, lumbar region 06/25/2017  . Achilles tendinitis of left lower extremity 12/07/2016  . Achilles tendinitis, left leg 05/17/2016  . Obstructive sleep apnea on CPAP 03/07/2015  . Post-operative state 03/07/2015  . Achilles rupture, left 10/21/2014  . OSA (obstructive sleep apnea) 04/23/2013  . Bipolar disorder, unspecified (HCC) 01/07/2013  . Sinus tachycardia 01/07/2013  . Headache(784.0) 01/07/2013  . SDH (subdural hematoma) (HCC) 01/04/2013  . H/O PE 01/04/2013  . Chronic anticoagulation 01/04/2013  . 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  . Acute sinusitis 01/04/2013   Past Medical History:  Diagnosis Date  . Achilles rupture, left   . Bipolar 1 disorder (HCC)   . Dental caries    periodontitis  . Pneumonia   . Pulmonary embolism  (HCC)   . Sleep apnea    wears CPAP  . Subdural hematoma (HCC) 2015    Family History  Problem Relation Age of Onset  . Hypertension Mother   . Stroke Mother   . Multiple sclerosis Sister   . Down syndrome Son     Past Surgical History:  Procedure Laterality Date  . ACHILLES TENDON SURGERY Left 10/21/2014   Procedure: Left Achilles Reconstruction;  Surgeon: Nadara Mustard, MD;  Location: [facility] OR;  Service: Orthopedics;  Laterality: Left;  . APPENDECTOMY    . CRANIOTOMY N/A 01/19/2013   Procedure: CRANIOTOMY HEMATOMA EVACUATION SUBDURAL;  Surgeon: Hewitt Shorts, MD;  Location: [facility];  Service: Neurosurgery;  Laterality: N/A;  . CYSTECTOMY     right head  . ELBOW SURGERY     right  . FRACTURE SURGERY     finger  . I&D EXTREMITY Left 12/07/2016   Procedure: LEFT ACHILLES DEBRIDEMENT;  Surgeon: Nadara Mustard, MD;  Location: [facility] OR;  Service: Orthopedics;  Laterality: Left;  . LUMBAR FUSION  12/23/2017   L5 GILL PROCEDURE, RIGHT L5-S1, TRANSFORAMIAL LUMBAR INTERBODY FUSION, BILATERAL LATERAL FUSION, PEDICLE INSTRUMENTATION  . MULTIPLE EXTRACTIONS WITH ALVEOLOPLASTY N/A 03/07/2015   Procedure: MULTIPLE EXTRACTION WITH ALVEOLOPLASTY;  Surgeon: [name], DDS;  Location: [facility] OR;  Service: Oral Surgery;  Laterality: N/A;   Social History   Occupational History  . Occupation: disability  Tobacco Use  . Smoking status:  Never Smoker  . Smokeless tobacco: Never Used  Substance and Sexual Activity  . Alcohol use: No  . Drug use: No  . Sexual activity: Not on file

## 2018-03-24 ENCOUNTER — Other Ambulatory Visit: Payer: Self-pay

## 2018-03-24 ENCOUNTER — Encounter (HOSPITAL_COMMUNITY): Payer: Self-pay

## 2018-03-24 ENCOUNTER — Emergency Department (HOSPITAL_COMMUNITY): Payer: Medicaid Other

## 2018-03-24 ENCOUNTER — Emergency Department (HOSPITAL_COMMUNITY)
Admission: EM | Admit: 2018-03-24 | Discharge: 2018-03-24 | Disposition: A | Discharge status: Home / Self Care | Payer: Medicaid Other | Attending: Emergency Medicine | Admitting: Emergency Medicine

## 2018-03-24 DIAGNOSIS — R079 Chest pain, unspecified: Secondary | ICD-10-CM | POA: Diagnosis present

## 2018-03-24 DIAGNOSIS — J209 Acute bronchitis, unspecified: Secondary | ICD-10-CM | POA: Insufficient documentation

## 2018-03-24 DIAGNOSIS — Z79899 Other long term (current) drug therapy: Secondary | ICD-10-CM | POA: Insufficient documentation

## 2018-03-24 DIAGNOSIS — J40 Bronchitis, not specified as acute or chronic: Secondary | ICD-10-CM

## 2018-03-24 LAB — BASIC METABOLIC PANEL
Anion gap: 8 (ref 5–15)
BUN: 7 mg/dL (ref 6–20)
CALCIUM: 9.1 mg/dL (ref 8.9–10.3)
CHLORIDE: 106 mmol/L (ref 98–111)
CO2: 26 mmol/L (ref 22–32)
CREATININE: 1.38 mg/dL — AB (ref 0.61–1.24)
GFR calc Af Amer: >60 mL/min (ref >60)
GFR calc non Af Amer: 58 mL/min — ABNORMAL LOW (ref >60)
GLUCOSE: 115 mg/dL — AB (ref 70–99)
Potassium: 3.8 mmol/L (ref 3.5–5.1)
Sodium: 140 mmol/L (ref 135–145)

## 2018-03-24 LAB — CBC
HEMATOCRIT: 40.9 % (ref 39.0–52.0)
Hemoglobin: 13 g/dL (ref 13.0–17.0)
MCH: 26.9 pg (ref 26.0–34.0)
MCHC: 31.8 g/dL (ref 30.0–36.0)
MCV: 84.5 fL (ref 80.0–100.0)
PLATELETS: 267 10*3/uL (ref 150–400)
RBC: 4.84 MIL/uL (ref 4.22–5.81)
RDW: 14.5 % (ref 11.5–15.5)
WBC: 9.8 10*3/uL (ref 4.0–10.5)
nRBC: 0 % (ref 0.0–0.2)

## 2018-03-24 LAB — I-STAT TROPONIN, ED: Troponin i, poc: 0.01 ng/mL (ref 0.00–0.08)

## 2018-03-24 LAB — D-DIMER, QUANTITATIVE: D-Dimer, Quant: 0.28 ug/mL-FEU (ref 0.00–0.50)

## 2018-03-24 MED ORDER — ACETAMINOPHEN 500 MG PO TABS
1000.0000 mg | ORAL_TABLET | Freq: Once | ORAL | Status: AC
  Administered 2018-03-24: 1000 mg via ORAL
  Filled 2018-03-24: qty 2

## 2018-03-24 NOTE — ED Provider Notes (Signed)
[facility] EMERGENCY DEPARTMENT Provider Note   CSN: [id number] Arrival date & time: 03/24/18  1352     History   Chief Complaint Chief Complaint  Patient presents with  . Chest Pain    HPI Justin [name] is a 53 y.o. male.  HPI   Patient is a 53 year old male with a past medical history of bipolar disorder, PE several years ago not currently on anticoagulation, OSA, and back surgery in October 2019 currently on as needed Percocets who presents for evaluation of 1 week of cough and congestion that became associated with pleuritic chest pain approximately 3 days ago.  Patient states his chest pain is worse when he is coughing but that he does have pain in between coughing episodes.  He states it is substernal.  He states it came on gradually and does not radiate.  He denies prior similar episodes.  Denies any alleviating or aggravating factors.  Denies any fevers, earache, vomiting, diarrhea, dysuria, rash, focal extremity pain, or other acute complaints.  Past Medical History:  Diagnosis Date  . Achilles rupture, left   . Bipolar 1 disorder (HCC)   . Dental caries    periodontitis  . Pneumonia   . Pulmonary embolism (HCC)   . Sleep apnea    wears CPAP  . Subdural hematoma (HCC) 2015    Patient Active Problem List   Diagnosis Date Noted  . Status post lumbar spinal fusion 01/03/2018  . Lumbar stenosis 12/23/2017  . Spondylolisthesis, lumbar region 06/25/2017  . Achilles tendinitis of left lower extremity 12/07/2016  . Achilles tendinitis, left leg 05/17/2016  . Obstructive sleep apnea on CPAP 03/07/2015  . Post-operative state 03/07/2015  . Achilles rupture, left 10/21/2014  . OSA (obstructive sleep apnea) 04/23/2013  . Bipolar disorder, unspecified (HCC) 01/07/2013  . Sinus tachycardia 01/07/2013  . Headache(784.0) 01/07/2013  . SDH (subdural hematoma) (HCC) 01/04/2013  . H/O PE 01/04/2013  . Chronic anticoagulation 01/04/2013  . Warfarin-induced  coagulopathy (HCC) 01/04/2013  . Acute sinusitis 01/04/2013    Past Surgical History:  Procedure Laterality Date  . ACHILLES TENDON SURGERY Left 10/21/2014   Procedure: Left Achilles Reconstruction;  Surgeon: Nadara Mustard, MD;  Location: [facility] OR;  Service: Orthopedics;  Laterality: Left;  . APPENDECTOMY    . CRANIOTOMY N/A 01/19/2013   Procedure: CRANIOTOMY HEMATOMA EVACUATION SUBDURAL;  Surgeon: Hewitt Shorts, MD;  Location: [facility];  Service: Neurosurgery;  Laterality: N/A;  . CYSTECTOMY     right head  . ELBOW SURGERY     right  . FRACTURE SURGERY     finger  . I&D EXTREMITY Left 12/07/2016   Procedure: LEFT ACHILLES DEBRIDEMENT;  Surgeon: Nadara Mustard, MD;  Location: [facility] OR;  Service: Orthopedics;  Laterality: Left;  . LUMBAR FUSION  12/23/2017   L5 GILL PROCEDURE, RIGHT L5-S1, TRANSFORAMIAL LUMBAR INTERBODY FUSION, BILATERAL LATERAL FUSION, PEDICLE INSTRUMENTATION  . MULTIPLE EXTRACTIONS WITH ALVEOLOPLASTY N/A 03/07/2015   Procedure: MULTIPLE EXTRACTION WITH ALVEOLOPLASTY;  Surgeon: [name], DDS;  Location: [facility] OR;  Service: Oral Surgery;  Laterality: N/A;        Home Medications    Prior to Admission medications   Medication Sig Start Date End Date Taking? Authorizing Provider  doxepin (SINEQUAN) 10 MG capsule Take 20 mg by mouth at bedtime.  07/15/17   [provider]  HYDROcodone-acetaminophen (NORCO) 5-325 MG tablet Take 1 tablet by mouth at bedtime as needed for moderate pain. 02/03/18   Eldred Manges,  MD  hydrOXYzine (ATARAX/VISTARIL) 25 MG tablet Take 25 mg by mouth 3 (three) times daily as needed for anxiety.  09/05/17   [provider]  lithium 600 MG capsule Take 600 mg by mouth at bedtime.    [provider]  methocarbamol (ROBAXIN) 500 MG tablet Take 1 tablet (500 mg total) by mouth every 8 (eight) hours as needed for muscle spasms. 01/22/18   Eldred MangesYates, Mark C, MD  mirtazapine (REMERON) 45 MG tablet Take 45 mg by mouth at bedtime.     [provider]  oxyCODONE-acetaminophen (PERCOCET/ROXICET) 5-325 MG tablet Take 1-2 tablets by mouth every 6 (six) hours as needed for severe pain. 12/27/17   Naida Sleightwens, James M, PA-C  QUEtiapine (SEROQUEL XR) 400 MG 24 hr tablet Take 800 mg by mouth at bedtime.    [provider]  traMADol (ULTRAM) 50 MG tablet Take 1 tablet (50 mg total) by mouth every 6 (six) hours as needed. 01/03/18   Eldred MangesYates, Mark C, MD    Family History Family History  Problem Relation Age of Onset  . Hypertension Mother   . Stroke Mother   . Multiple sclerosis Sister   . Down syndrome Son     Social History Social History   Tobacco Use  . Smoking status: Never Smoker  . Smokeless tobacco: Never Used  Substance Use Topics  . Alcohol use: No  . Drug use: No     Allergies   Patient has no known allergies.   Review of Systems Review of Systems  Constitutional: Negative for chills and fever.  HENT: Positive for congestion. Negative for ear pain and sore throat.   Eyes: Negative for pain and visual disturbance.  Respiratory: Positive for cough. Negative for shortness of breath.   Cardiovascular: Positive for chest pain. Negative for palpitations.  Gastrointestinal: Negative for abdominal pain and vomiting.  Genitourinary: Negative for dysuria and hematuria.  Musculoskeletal: Negative for arthralgias and back pain.  Skin: Negative for color change and rash.  Neurological: Negative for seizures and syncope.  All other systems reviewed and are negative.    Physical Exam Updated Vital Signs BP (!) 141/106   Pulse 87   Temp 99.3 F (37.4 C) (Oral)   Resp 16   Ht 5\' 9"  (1.753 m)   Wt 104.3 kg   SpO2 97%   BMI 33.97 kg/m   Physical Exam Vitals signs and nursing note reviewed.  Constitutional:      Appearance: He is well-developed.  HENT:     Head: Normocephalic and atraumatic.  Eyes:     Conjunctiva/sclera: Conjunctivae normal.  Neck:     Musculoskeletal: Neck supple.    Cardiovascular:     Rate and Rhythm: Normal rate and regular rhythm.     Pulses:          Radial pulses are 2+ on the right side and 2+ on the left side.     Heart sounds: No murmur.  Pulmonary:     Effort: Pulmonary effort is normal. No respiratory distress.     Breath sounds: Normal breath sounds.  Abdominal:     Palpations: Abdomen is soft.     Tenderness: There is no abdominal tenderness.  Skin:    General: Skin is warm and dry.  Neurological:     Mental Status: He is alert.      ED Treatments / Results  Labs (all labs ordered are listed, but only abnormal results are displayed) Labs Reviewed  BASIC METABOLIC PANEL -  Abnormal; Notable for the following components:      Result Value   Glucose, Bld 115 (*)    Creatinine, Ser 1.38 (*)    GFR calc non Af Amer 58 (*)    All other components within normal limits  CBC  D-DIMER, QUANTITATIVE (NOT AT [facility])  I-STAT TROPONIN, ED    EKG EKG Interpretation  Date/Time:  Monday March 24 2018 14:00:26 EST Ventricular Rate:  94 PR Interval:  168 QRS Duration: 92 QT Interval:  402 QTC Calculation: 502 R Axis:   -27 Text Interpretation:  Normal sinus rhythm Possible Anterior infarct , age undetermined Prolonged QT Abnormal ECG No significant change since last tracing Confirmed by Tilden Fossa [phone]) on 03/24/2018 6:39:52 PM   Radiology Dg Chest 2 View  Result Date: 03/24/2018 CLINICAL DATA:  Midline chest pain. Shortness of breath. Dry cough. EXAM: CHEST - 2 VIEW COMPARISON:  12/25/2017; 08/21/2017; 07/21/2017; chest CT-07/21/2017 FINDINGS: Grossly unchanged borderline enlarged cardiac silhouette. Normal mediastinal contours. No focal parenchymal opacities. No pleural effusion or pneumothorax. No evidence of edema. No acute osseous abnormalities. IMPRESSION: Borderline cardiomegaly without superimposed acute cardiopulmonary disease. Electronically Signed   By: Simonne Come M.D.   On: 03/24/2018 15:16     Procedures Procedures (including critical care time)  Medications Ordered in ED Medications  acetaminophen (TYLENOL) tablet 1,000 mg (has no administration in time range)     Initial Impression / Assessment and Plan / ED Course  I have reviewed the triage vital signs and the nursing notes.  Pertinent labs & imaging results that were available during my care of the patient were reviewed by me and considered in my medical decision making (see chart for details).     Patient is a 53 year old male who presents above-stated history exam.  On presentation patient is afebrile stable vital signs.  Exam as above.  ECG shows NSR with ventricular rate of 93 and a QTC of 502 with otherwise no signs of acute ischemic change.  ECG is not significantly different than prior tracings.  History and exam is not consistent with ACS and given ECG and troponin that is 0.01 I have low suspicion for ACS.  BMP shows no significant electrolyte or metabolic abnormalities.  Patient is noted to have a creatinine of 1.38 that is not dissimilar from levels drawn 2 months and 8 months ago.  CBC is WNL.  I have a low suspicion for PE given d-dimer of 0.28.  History exam is not consistent with aortic dissection.  Chest x-ray does not show any findings consistent with pneumonia, pneumothorax, pulmonary edema, pleural effusion.  There is some cardiomegaly.  Impression is viral bronchitis.  Patient discharged in stable condition.  Strict return precautions advised and discussed.  Instructed follow-up with PCP in 2 to 3 days.  Patient voiced understanding agreement with this plan prior to discharge.  Final Clinical Impressions(s) / ED Diagnoses   Final diagnoses:  Bronchitis    ED Discharge Orders    None       Antoine Primas, MD 03/24/18 Terence Lux    Tilden Fossa, MD 03/26/18 [phone]

## 2018-03-24 NOTE — ED Triage Notes (Signed)
Pt reports cough for 2-3 weeks that is non productive, dry and harsh.  Pt reports developing CP last night that is worse with cough and inspiration and during exertion.  Pt reports dizziness as an associated symptom.

## 2018-03-24 NOTE — ED Notes (Signed)
Patient Alert and oriented to baseline. Stable and ambulatory to baseline. Patient verbalized understanding of the discharge instructions.  Patient belongings were taken by the patient.   

## 2018-05-06 ENCOUNTER — Ambulatory Visit (INDEPENDENT_AMBULATORY_CARE_PROVIDER_SITE_OTHER): Payer: Medicaid Other | Admitting: Orthopaedic Surgery

## 2018-05-06 ENCOUNTER — Encounter (INDEPENDENT_AMBULATORY_CARE_PROVIDER_SITE_OTHER): Payer: Self-pay | Admitting: Orthopaedic Surgery

## 2018-05-06 VITALS — BP 130/85 | HR 98 | Temp 101.2°F | Ht 69.0 in | Wt 240.0 lb

## 2018-05-06 DIAGNOSIS — Z981 Arthrodesis status: Secondary | ICD-10-CM | POA: Diagnosis not present

## 2018-05-06 NOTE — Progress Notes (Signed)
Office Visit Note   Patient: Justin [name]           Date of Birth: May 18, 1965           MRN: [id number] Visit Date: 05/06/2018              Requested by: [facility] [address] [place], [place] [address] PCP: [facility], [facility]   Assessment & Plan: Visit Diagnoses:  1. Status post lumbar spinal fusion     Plan: Patient is happy the surgical result from a lumbar fusion 12/23/2017.  He is off medication is amatory good relief of leg pain and will return on an as-needed basis.  Follow-Up Instructions: Return if symptoms worsen or fail to improve.   Orders:  No orders of the defined types were placed in this encounter.  No orders of the defined types were placed in this encounter.     Procedures: No procedures performed   Clinical Data: No additional findings.   Subjective: Chief Complaint  Patient presents with  . Lower Back - Follow-up    12/23/17 L5 GILL Procedure, Right L5-S1 TLIF, Bilat Lateral Fusion, Pedicle Instrumentation    HPI follow-up L5-S1 fusion 12/23/2017.  Good relief of leg pain he is amatory is working on losing some weight walking the trails along [place].  He still has had the flu has temperature 102 today and is wearing a mask.  Review of Systems updated and unchanged.  Of note is history of subdural hematoma warfarin induced coagulopathy bipolar disorder lumbar fusion.   Objective: Vital Signs: BP 130/85   Pulse 98   Temp (!) 101.2 F (38.4 C)   Ht 5\' 9"  (1.753 m)   Wt 240 lb (108.9 kg)   BMI 35.44 kg/m   Physical Exam Constitutional:      Appearance: He is well-developed.  HENT:     Head: Normocephalic and atraumatic.  Eyes:     Pupils: Pupils are equal, round, and reactive to light.  Neck:     Thyroid: No thyromegaly.     Trachea: No tracheal deviation.  Cardiovascular:     Rate and Rhythm: Normal rate.  Pulmonary:     Effort: Pulmonary effort is normal.     Breath sounds: No wheezing.  Abdominal:   General: Bowel sounds are normal.     Palpations: Abdomen is soft.  Skin:    General: Skin is warm and dry.     Capillary Refill: Capillary refill takes less than 2 seconds.  Neurological:     Mental Status: He is alert and oriented to person, place, and time.  Psychiatric:        Behavior: Behavior normal.        Thought Content: Thought content normal.        Judgment: Judgment normal.     Ortho Exam healed lumbar fusion incision normal heel toe gait.  Normal sensation no rash over exposed skin.  Specialty Comments:  No specialty comments available.  Imaging: No results found.   PMFS History: Patient Active Problem List   Diagnosis Date Noted  . Status post lumbar spinal fusion 01/03/2018  . Lumbar stenosis 12/23/2017  . Spondylolisthesis, lumbar region 06/25/2017  . Achilles tendinitis of left lower extremity 12/07/2016  . Achilles tendinitis, left leg 05/17/2016  . Obstructive sleep apnea on CPAP 03/07/2015  . Post-operative state 03/07/2015  . Achilles rupture, left 10/21/2014  . OSA (obstructive sleep apnea) 04/23/2013  . Bipolar disorder, unspecified (HCC) 01/07/2013  .  Sinus tachycardia 01/07/2013  . Headache(784.0) 01/07/2013  . SDH (subdural hematoma) (HCC) 01/04/2013  . H/O PE 01/04/2013  . Chronic anticoagulation 01/04/2013  . 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  . Acute sinusitis 01/04/2013   Past Medical History:  Diagnosis Date  . Achilles rupture, left   . Bipolar 1 disorder (HCC)   . Dental caries    periodontitis  . Pneumonia   . Pulmonary embolism (HCC)   . Sleep apnea    wears CPAP  . Subdural hematoma (HCC) 2015    Family History  Problem Relation Age of Onset  . Hypertension Mother   . Stroke Mother   . Multiple sclerosis Sister   . Down syndrome Son     Past Surgical History:  Procedure Laterality Date  . ACHILLES TENDON SURGERY Left 10/21/2014   Procedure: Left Achilles Reconstruction;  Surgeon: Nadara Mustard, MD;   Location: [facility] OR;  Service: Orthopedics;  Laterality: Left;  . APPENDECTOMY    . CRANIOTOMY N/A 01/19/2013   Procedure: CRANIOTOMY HEMATOMA EVACUATION SUBDURAL;  Surgeon: Hewitt Shorts, MD;  Location: [facility];  Service: Neurosurgery;  Laterality: N/A;  . CYSTECTOMY     right head  . ELBOW SURGERY     right  . FRACTURE SURGERY     finger  . I&D EXTREMITY Left 12/07/2016   Procedure: LEFT ACHILLES DEBRIDEMENT;  Surgeon: Nadara Mustard, MD;  Location: [facility] OR;  Service: Orthopedics;  Laterality: Left;  . LUMBAR FUSION  12/23/2017   L5 GILL PROCEDURE, RIGHT L5-S1, TRANSFORAMIAL LUMBAR INTERBODY FUSION, BILATERAL LATERAL FUSION, PEDICLE INSTRUMENTATION  . MULTIPLE EXTRACTIONS WITH ALVEOLOPLASTY N/A 03/07/2015   Procedure: MULTIPLE EXTRACTION WITH ALVEOLOPLASTY;  Surgeon: [name], DDS;  Location: [facility] OR;  Service: Oral Surgery;  Laterality: N/A;   Social History   Occupational History  . Occupation: disability  Tobacco Use  . Smoking status: Never Smoker  . Smokeless tobacco: Never Used  Substance and Sexual Activity  . Alcohol use: No  . Drug use: No  . Sexual activity: Not on file

## 2018-05-07 ENCOUNTER — Encounter (HOSPITAL_COMMUNITY): Payer: Self-pay | Admitting: Emergency Medicine

## 2018-05-07 ENCOUNTER — Other Ambulatory Visit: Payer: Self-pay

## 2018-05-07 ENCOUNTER — Emergency Department (HOSPITAL_COMMUNITY): Payer: Medicaid Other

## 2018-05-07 ENCOUNTER — Emergency Department (HOSPITAL_COMMUNITY)
Admission: EM | Admit: 2018-05-07 | Discharge: 2018-05-07 | Disposition: A | Discharge status: Home / Self Care | Payer: Medicaid Other | Attending: Emergency Medicine | Admitting: Emergency Medicine

## 2018-05-07 DIAGNOSIS — R05 Cough: Secondary | ICD-10-CM | POA: Diagnosis present

## 2018-05-07 DIAGNOSIS — R509 Fever, unspecified: Secondary | ICD-10-CM | POA: Diagnosis not present

## 2018-05-07 DIAGNOSIS — Z79899 Other long term (current) drug therapy: Secondary | ICD-10-CM | POA: Insufficient documentation

## 2018-05-07 DIAGNOSIS — R059 Cough, unspecified: Secondary | ICD-10-CM

## 2018-05-07 LAB — CBC WITH DIFFERENTIAL/PLATELET
Abs Immature Granulocytes: 0.04 10*3/uL (ref 0.00–0.07)
Basophils Absolute: 0 10*3/uL (ref 0.0–0.1)
Basophils Relative: 1 %
EOS PCT: 0 %
Eosinophils Absolute: 0 10*3/uL (ref 0.0–0.5)
HCT: 37.6 % — ABNORMAL LOW (ref 39.0–52.0)
HEMOGLOBIN: 12.2 g/dL — AB (ref 13.0–17.0)
Immature Granulocytes: 1 %
Lymphocytes Relative: 8 %
Lymphs Abs: 0.7 10*3/uL (ref 0.7–4.0)
MCH: 28.1 pg (ref 26.0–34.0)
MCHC: 32.4 g/dL (ref 30.0–36.0)
MCV: 86.6 fL (ref 80.0–100.0)
Monocytes Absolute: 0.9 10*3/uL (ref 0.1–1.0)
Monocytes Relative: 11 %
Neutro Abs: 6.4 10*3/uL (ref 1.7–7.7)
Neutrophils Relative %: 79 %
Platelets: 210 10*3/uL (ref 150–400)
RBC: 4.34 MIL/uL (ref 4.22–5.81)
RDW: 15.8 % — ABNORMAL HIGH (ref 11.5–15.5)
WBC: 8 10*3/uL (ref 4.0–10.5)
nRBC: 0 % (ref 0.0–0.2)

## 2018-05-07 LAB — INFLUENZA PANEL BY PCR (TYPE A & B)
Influenza A By PCR: NEGATIVE
Influenza B By PCR: NEGATIVE

## 2018-05-07 LAB — COMPREHENSIVE METABOLIC PANEL
ALK PHOS: 76 U/L (ref 38–126)
ALT: 36 U/L (ref 0–44)
AST: 49 U/L — ABNORMAL HIGH (ref 15–41)
Albumin: 4.4 g/dL (ref 3.5–5.0)
Anion gap: 9 (ref 5–15)
BILIRUBIN TOTAL: 0.5 mg/dL (ref 0.3–1.2)
BUN: 13 mg/dL (ref 6–20)
CO2: 24 mmol/L (ref 22–32)
Calcium: 8.4 mg/dL — ABNORMAL LOW (ref 8.9–10.3)
Chloride: 104 mmol/L (ref 98–111)
Creatinine, Ser: 1.49 mg/dL — ABNORMAL HIGH (ref 0.61–1.24)
GFR calc Af Amer: >60 mL/min (ref >60)
GFR calc non Af Amer: 52 mL/min — ABNORMAL LOW (ref >60)
Glucose, Bld: 125 mg/dL — ABNORMAL HIGH (ref 70–99)
Potassium: 3.9 mmol/L (ref 3.5–5.1)
Sodium: 137 mmol/L (ref 135–145)
TOTAL PROTEIN: 7.6 g/dL (ref 6.5–8.1)

## 2018-05-07 MED ORDER — DOXYCYCLINE HYCLATE 100 MG PO CAPS: 100.0000 mg | ORAL_CAPSULE | Freq: Two times a day (BID) | ORAL | 0 refills | Status: DC

## 2018-05-07 MED ORDER — SODIUM CHLORIDE 0.9 % IV BOLUS
1000.0000 mL | Freq: Once | INTRAVENOUS | Status: AC
  Administered 2018-05-07: 1000 mL via INTRAVENOUS

## 2018-05-07 MED ORDER — GUAIFENESIN-DM 100-10 MG/5ML PO SYRP: 5.0000 mL | ORAL_SOLUTION | Freq: Four times a day (QID) | ORAL | 0 refills | Status: DC | PRN

## 2018-05-07 NOTE — ED Notes (Signed)
Bed: WA23 Expected date:  Expected time:  Means of arrival:  Comments: EMS- fever 

## 2018-05-07 NOTE — ED Notes (Signed)
Patient given ginger ale. 

## 2018-05-07 NOTE — ED Provider Notes (Signed)
[facility]-EMERGENCY DEPT Provider Note   CSN: [id number] Arrival date & time: 05/07/18  0957    History   Chief Complaint Chief Complaint  Patient presents with  . Fever  . Generalized Body Aches  . Cough    HPI Justin [name] is a 53 y.o. male.     53 year old male with past medical history below including bipolar disorder, PE, OSA, subdural hematoma who presents with cough and fevers.  Patient states that yesterday he began having a cough associated with nasal congestion and then developed sore throat, fevers, and body aches.  No vomiting or diarrhea.  His symptoms worsened today.  No medications prior to arrival.  He was given Tylenol by EMS.  No sick contacts or recent travel.  No shortness of breath or chest pain.  The history is provided by the patient.  Fever  Associated symptoms: cough   Cough  Associated symptoms: fever     Past Medical History:  Diagnosis Date  . Achilles rupture, left   . Bipolar 1 disorder (HCC)   . Dental caries    periodontitis  . Pneumonia   . Pulmonary embolism (HCC)   . Sleep apnea    wears CPAP  . Subdural hematoma (HCC) 2015    Patient Active Problem List   Diagnosis Date Noted  . Status post lumbar spinal fusion 01/03/2018  . Lumbar stenosis 12/23/2017  . Spondylolisthesis, lumbar region 06/25/2017  . Achilles tendinitis of left lower extremity 12/07/2016  . Achilles tendinitis, left leg 05/17/2016  . Obstructive sleep apnea on CPAP 03/07/2015  . Post-operative state 03/07/2015  . Achilles rupture, left 10/21/2014  . OSA (obstructive sleep apnea) 04/23/2013  . Bipolar disorder, unspecified (HCC) 01/07/2013  . Sinus tachycardia 01/07/2013  . Headache(784.0) 01/07/2013  . SDH (subdural hematoma) (HCC) 01/04/2013  . H/O PE 01/04/2013  . Chronic anticoagulation 01/04/2013  . 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  . Acute sinusitis 01/04/2013    Past Surgical History:  Procedure Laterality  Date  . ACHILLES TENDON SURGERY Left 10/21/2014   Procedure: Left Achilles Reconstruction;  Surgeon: Nadara Mustard, MD;  Location: [facility] OR;  Service: Orthopedics;  Laterality: Left;  . APPENDECTOMY    . CRANIOTOMY N/A 01/19/2013   Procedure: CRANIOTOMY HEMATOMA EVACUATION SUBDURAL;  Surgeon: Hewitt Shorts, MD;  Location: [facility];  Service: Neurosurgery;  Laterality: N/A;  . CYSTECTOMY     right head  . ELBOW SURGERY     right  . FRACTURE SURGERY     finger  . I&D EXTREMITY Left 12/07/2016   Procedure: LEFT ACHILLES DEBRIDEMENT;  Surgeon: Nadara Mustard, MD;  Location: [facility] OR;  Service: Orthopedics;  Laterality: Left;  . LUMBAR FUSION  12/23/2017   L5 GILL PROCEDURE, RIGHT L5-S1, TRANSFORAMIAL LUMBAR INTERBODY FUSION, BILATERAL LATERAL FUSION, PEDICLE INSTRUMENTATION  . MULTIPLE EXTRACTIONS WITH ALVEOLOPLASTY N/A 03/07/2015   Procedure: MULTIPLE EXTRACTION WITH ALVEOLOPLASTY;  Surgeon: [name], DDS;  Location: [facility] OR;  Service: Oral Surgery;  Laterality: N/A;        Home Medications    Prior to Admission medications   Medication Sig Start Date End Date Taking? Authorizing Provider  doxepin (SINEQUAN) 10 MG capsule Take 20 mg by mouth at bedtime.  07/15/17  Yes [provider]  hydrOXYzine (ATARAX/VISTARIL) 25 MG tablet Take 25 mg by mouth 3 (three) times daily as needed for anxiety.  09/05/17  Yes [provider]  lithium 600 MG capsule Take 600 mg by mouth at  bedtime.   Yes [provider]  mirtazapine (REMERON) 45 MG tablet Take 45 mg by mouth at bedtime.   Yes [provider]  QUEtiapine (SEROQUEL XR) 400 MG 24 hr tablet Take 800 mg by mouth at bedtime.   Yes [provider]  doxycycline (VIBRAMYCIN) 100 MG capsule Take 1 capsule (100 mg total) by mouth 2 (two) times daily. 05/07/18   Oneisha Ammons, Ambrose Finland, MD  guaiFENesin-dextromethorphan (ROBITUSSIN DM) 100-10 MG/5ML syrup Take 5 mLs by mouth every 6 (six) hours as needed for cough.  05/07/18   Gwendalyn Mcgonagle, Ambrose Finland, MD  HYDROcodone-acetaminophen (NORCO) 5-325 MG tablet Take 1 tablet by mouth at bedtime as needed for moderate pain. Patient not taking: Reported on 05/06/2018 02/03/18   Eldred Manges, MD  methocarbamol (ROBAXIN) 500 MG tablet Take 1 tablet (500 mg total) by mouth every 8 (eight) hours as needed for muscle spasms. Patient not taking: Reported on 05/06/2018 01/22/18   Eldred Manges, MD  oxyCODONE-acetaminophen (PERCOCET/ROXICET) 5-325 MG tablet Take 1-2 tablets by mouth every 6 (six) hours as needed for severe pain. Patient not taking: Reported on 05/06/2018 12/27/17   Naida Sleight, PA-C  traMADol (ULTRAM) 50 MG tablet Take 1 tablet (50 mg total) by mouth every 6 (six) hours as needed. Patient not taking: Reported on 05/06/2018 01/03/18   Eldred Manges, MD    Family History Family History  Problem Relation Age of Onset  . Hypertension Mother   . Stroke Mother   . Multiple sclerosis Sister   . Down syndrome Son     Social History Social History   Tobacco Use  . Smoking status: Never Smoker  . Smokeless tobacco: Never Used  Substance Use Topics  . Alcohol use: No  . Drug use: No     Allergies   Patient has no known allergies.   Review of Systems Review of Systems  Constitutional: Positive for fever.  Respiratory: Positive for cough.    All other systems reviewed and are negative except that which was mentioned in HPI   Physical Exam Updated Vital Signs BP 134/83 (BP Location: Left Arm)   Pulse 96   Temp 99.7 F (37.6 C) (Oral)   Resp 18   SpO2 96%   Physical Exam Vitals signs and nursing note reviewed.  Constitutional:      General: He is not in acute distress.    Appearance: He is well-developed.  HENT:     Head: Normocephalic and atraumatic.     Mouth/Throat:     Mouth: Mucous membranes are moist.     Pharynx: Oropharynx is clear.  Eyes:     Conjunctiva/sclera: Conjunctivae normal.     Pupils: Pupils are equal, round, and  reactive to light.  Neck:     Musculoskeletal: Neck supple.  Cardiovascular:     Rate and Rhythm: Regular rhythm. Tachycardia present.     Heart sounds: Normal heart sounds. No murmur.  Pulmonary:     Effort: Pulmonary effort is normal.     Breath sounds: Normal breath sounds.  Abdominal:     General: Bowel sounds are normal. There is no distension.     Palpations: Abdomen is soft.     Tenderness: There is no abdominal tenderness.  Musculoskeletal:        General: No swelling.  Skin:    General: Skin is warm and dry.  Neurological:     Mental Status: He is alert and oriented to person, place, and time.  Comments: Fluent speech  Psychiatric:        Judgment: Judgment normal.      ED Treatments / Results  Labs (all labs ordered are listed, but only abnormal results are displayed) Labs Reviewed  CBC WITH DIFFERENTIAL/PLATELET - Abnormal; Notable for the following components:      Result Value   Hemoglobin 12.2 (*)    HCT 37.6 (*)    RDW 15.8 (*)    All other components within normal limits  COMPREHENSIVE METABOLIC PANEL - Abnormal; Notable for the following components:   Glucose, Bld 125 (*)    Creatinine, Ser 1.49 (*)    Calcium 8.4 (*)    AST 49 (*)    GFR calc non Af Amer 52 (*)    All other components within normal limits  INFLUENZA PANEL BY PCR (TYPE A & B)  URINALYSIS, ROUTINE W REFLEX MICROSCOPIC    EKG None  Radiology Dg Chest 2 View  Result Date: 05/07/2018 CLINICAL DATA:  Cough and fever EXAM: CHEST - 2 VIEW COMPARISON:  March 24, 2018 FINDINGS: There is atelectatic change in the left base. The lungs elsewhere are clear. Heart is upper normal in size with pulmonary vascularity normal. No adenopathy. No bone lesions. IMPRESSION: Left base atelectasis. Lungs elsewhere clear. Stable cardiac silhouette. Electronically Signed   By: Bretta Bang III M.D.   On: 05/07/2018 11:16    Procedures Procedures (including critical care time)  Medications  Ordered in ED Medications  sodium chloride 0.9 % bolus 1,000 mL (0 mLs Intravenous Stopped 05/07/18 1302)     Initial Impression / Assessment and Plan / ED Course  I have reviewed the triage vital signs and the nursing notes.  Pertinent labs & imaging results that were available during my care of the patient were reviewed by me and considered in my medical decision making (see chart for details).        Uncomfortable but nontoxic on exam, initially febrile but remainder of vital signs reassuring.  Lab work shows normal WBC count, creatinine 1.49.  Gave the patient fluid bolus.  Influenza negative.  Chest x-ray shows left basilar atelectasis.  Because of his fevers and associated symptoms, I wonder if he may have early pneumonia in left lung base.  Will treat with course of doxycycline.  Discussed supportive measures at home and extensively reviewed return precautions.  He voiced understanding.  Final Clinical Impressions(s) / ED Diagnoses   Final diagnoses:  Cough  Fever, unspecified fever cause    ED Discharge Orders         Ordered    doxycycline (VIBRAMYCIN) 100 MG capsule  2 times daily     05/07/18 1253    guaiFENesin-dextromethorphan (ROBITUSSIN DM) 100-10 MG/5ML syrup  Every 6 hours PRN     05/07/18 1253           Marko Skalski, Ambrose Finland, MD 05/07/18 [id number]

## 2018-05-07 NOTE — ED Triage Notes (Addendum)
Pt with fever, cough and generalized body aches. Pt states he has not taken any OTC meds. Pt got Tylenol 1000mg  po by EMS and pt reports he has had a flu vaccine.

## 2018-05-07 NOTE — ED Notes (Signed)
Patient provided with urinal and made aware urine sample is needed. 

## 2018-06-26 ENCOUNTER — Other Ambulatory Visit: Payer: Self-pay

## 2018-06-26 ENCOUNTER — Emergency Department (HOSPITAL_COMMUNITY)
Admission: EM | Admit: 2018-06-26 | Discharge: 2018-06-26 | Disposition: A | Discharge status: Home / Self Care | Payer: Medicaid Other | Attending: Emergency Medicine | Admitting: Emergency Medicine

## 2018-06-26 DIAGNOSIS — M545 Low back pain, unspecified: Secondary | ICD-10-CM

## 2018-06-26 DIAGNOSIS — Z79899 Other long term (current) drug therapy: Secondary | ICD-10-CM | POA: Insufficient documentation

## 2018-06-26 MED ORDER — METHOCARBAMOL 500 MG PO TABS: 500.0000 mg | ORAL_TABLET | Freq: Two times a day (BID) | ORAL | 0 refills | Status: DC | PRN

## 2018-06-26 MED ORDER — NAPROXEN 500 MG PO TABS: 500.0000 mg | ORAL_TABLET | Freq: Two times a day (BID) | ORAL | 0 refills | Status: DC

## 2018-06-26 MED ORDER — KETOROLAC TROMETHAMINE 15 MG/ML IJ SOLN
15.0000 mg | Freq: Once | INTRAMUSCULAR | Status: AC
  Administered 2018-06-26: 15 mg via INTRAMUSCULAR
  Filled 2018-06-26: qty 1

## 2018-06-26 MED ORDER — LIDOCAINE 5 % EX PTCH: 1.0000 | MEDICATED_PATCH | CUTANEOUS | 0 refills | Status: DC

## 2018-06-26 NOTE — ED Triage Notes (Addendum)
Pt reports having back surgery in October. Pt reports pain started 3 days ago to R lower back. Pt reports worsening pain while sitting. Pt denies numbness, weakness, fever or loss of control to bowel or bladder. Pt reports only complaint is pain.

## 2018-06-26 NOTE — ED Notes (Signed)
Patient verbalizes understanding of discharge instructions. Opportunity for questioning and answers were provided. Armband removed by staff, pt discharged from ED ambulatory to home.  

## 2018-06-26 NOTE — ED Provider Notes (Signed)
[facility]   CSN: [id number] Arrival date & time: 06/26/18  1645    History   Chief Complaint Chief Complaint  Patient presents with  . Back Pain    HPI Trellis MomentRobert C Marland is a 53 y.o. male senting for evaluation of back pain.  Patient states over the past 3 days, he has had gradually worsening right-sided back pain.  He describes it as a cramp/spasm.  Symptoms are worse when he moves and when he bends forward.  However, is not completely relieved with sitting, laying, resting.  He has not taken anything for this including Tylenol or ibuprofen.  He denies radiation of the pain.  He denies fall, trauma, or injury.  He denies fevers, chills, nausea, vomiting, domino pain, urinary symptoms, loss of bowel bladder control, numbness, tingling.  He denies history of cancer or IVDU.  Patient states he had lumbar spine surgery 12/2017 with Dr. Ophelia CharterYates, he has not called Dr. Ophelia CharterYates since his pain began.  He was symptom-free prior to 3 days ago.  Pain began when he was getting out of bed.  No pain on the left side.  Patient states he has no medical problems, takes a sleep aid, but no other medicines daily.     HPI  Past Medical History:  Diagnosis Date  . Achilles rupture, left   . Bipolar 1 disorder (HCC)   . Dental caries    periodontitis  . Pneumonia   . Pulmonary embolism (HCC)   . Sleep apnea    wears CPAP  . Subdural hematoma (HCC) 2015    Patient Active Problem List   Diagnosis Date Noted  . Status post lumbar spinal fusion 01/03/2018  . Lumbar stenosis 12/23/2017  . Spondylolisthesis, lumbar region 06/25/2017  . Achilles tendinitis of left lower extremity 12/07/2016  . Achilles tendinitis, left leg 05/17/2016  . Obstructive sleep apnea on CPAP 03/07/2015  . Post-operative state 03/07/2015  . Achilles rupture, left 10/21/2014  . OSA (obstructive sleep apnea) 04/23/2013  . Bipolar disorder, unspecified (HCC) 01/07/2013  .  Sinus tachycardia 01/07/2013  . Headache(784.0) 01/07/2013  . SDH (subdural hematoma) (HCC) 01/04/2013  . H/O PE 01/04/2013  . Chronic anticoagulation 01/04/2013  . 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  . Acute sinusitis 01/04/2013    Past Surgical History:  Procedure Laterality Date  . ACHILLES TENDON SURGERY Left 10/21/2014   Procedure: Left Achilles Reconstruction;  Surgeon: Nadara MustardMarcus Duda V, MD;  [facility];  Service: Orthopedics;  Laterality: Left;  . APPENDECTOMY    . CRANIOTOMY N/A 01/19/2013   Procedure: CRANIOTOMY HEMATOMA EVACUATION SUBDURAL;  Surgeon: Hewitt Shortsobert W Nudelman, MD;  Location: [facility];  Service: Neurosurgery;  Laterality: N/A;  . CYSTECTOMY     right head  . ELBOW SURGERY     right  . FRACTURE SURGERY     finger  . I&D EXTREMITY Left 12/07/2016   Procedure: LEFT ACHILLES DEBRIDEMENT;  Surgeon: Nadara Mustarduda, Marcus V, MD;  [facility];  Service: Orthopedics;  Laterality: Left;  . LUMBAR FUSION  12/23/2017   L5 GILL PROCEDURE, RIGHT L5-S1, TRANSFORAMIAL LUMBAR INTERBODY FUSION, BILATERAL LATERAL FUSION, PEDICLE INSTRUMENTATION  . MULTIPLE EXTRACTIONS WITH ALVEOLOPLASTY N/A 03/07/2015   Procedure: MULTIPLE EXTRACTION WITH ALVEOLOPLASTY;  Surgeon: [name], DDS;  Location: [facility] OR;  Service: Oral Surgery;  Laterality: N/A;        Home Medications    Prior to Admission medications   Medication Sig Start Date End Date Taking? Authorizing  Provider  doxepin (SINEQUAN) 10 MG capsule Take 20 mg by mouth at bedtime.  07/15/17  Yes [provider]  lithium 600 MG capsule Take 600 mg by mouth at bedtime.   Yes [provider]  mirtazapine (REMERON) 45 MG tablet Take 45 mg by mouth at bedtime.   Yes [provider]  QUEtiapine (SEROQUEL XR) 400 MG 24 hr tablet Take 800 mg by mouth at bedtime.   Yes [provider]  HYDROcodone-acetaminophen (NORCO) 5-325 MG tablet Take 1 tablet by mouth at bedtime as needed for moderate pain.  Patient not taking: Reported on 05/06/2018 02/03/18   Eldred Manges, MD  lidocaine (LIDODERM) 5 % Place 1 patch onto the skin daily. Remove & Discard patch within 12 hours or as directed by MD 06/26/18   Jaklyn Alen, PA-C  methocarbamol (ROBAXIN) 500 MG tablet Take 1 tablet (500 mg total) by mouth 2 (two) times daily as needed for muscle spasms. 06/26/18   Shineka Auble, PA-C  naproxen (NAPROSYN) 500 MG tablet Take 1 tablet (500 mg total) by mouth 2 (two) times daily with a meal. 06/26/18   Zander Ingham, PA-C    Family History Family History  Problem Relation Age of Onset  . Hypertension Mother   . Stroke Mother   . Multiple sclerosis Sister   . Down syndrome Son     Social History Social History   Tobacco Use  . Smoking status: Never Smoker  . Smokeless tobacco: Never Used  Substance Use Topics  . Alcohol use: No  . Drug use: No     Allergies   Patient has no known allergies.   Review of Systems Review of Systems  Musculoskeletal: Positive for back pain.  All other systems reviewed and are negative.    Physical Exam Updated Vital Signs BP (!) 153/103 (BP Location: Right Arm)   Pulse 89   Temp 98.4 F (36.9 C) (Oral)   Resp 16   SpO2 97%   Physical Exam Vitals signs and nursing note reviewed.  Constitutional:      General: He is not in acute distress.    Appearance: He is well-developed.     Comments: Appears nontoxic  HENT:     Head: Normocephalic and atraumatic.  Eyes:     Conjunctiva/sclera: Conjunctivae normal.     Pupils: Pupils are equal, round, and reactive to light.  Neck:     Musculoskeletal: Normal range of motion and neck supple.  Cardiovascular:     Rate and Rhythm: Normal rate and regular rhythm.  Pulmonary:     Effort: Pulmonary effort is normal. No respiratory distress.     Breath sounds: Normal breath sounds. No wheezing.  Abdominal:     General: There is no distension.     Palpations: Abdomen is soft. There is no mass.      Tenderness: There is no abdominal tenderness. There is no right CVA tenderness, left CVA tenderness, guarding or rebound.  Musculoskeletal: Normal range of motion.        General: Tenderness present.     Comments: Tenderness palpation of right low back musculature.  No tenderness palpation over midline spine or left back musculature.  Patient is ambulatory without difficulty.  No saddle paresthesia.  Pedal pulses intact bilaterally.  Sensation intact bilaterally.  Strength intact bilaterally.  Skin:    General: Skin is warm and dry.     Capillary Refill: Capillary refill takes less than 2 seconds.  Neurological:  Mental Status: He is alert and oriented to person, place, and time.      ED Treatments / Results  Labs (all labs ordered are listed, but only abnormal results are displayed) Labs Reviewed - No data to display  EKG None  Radiology No results found.  Procedures Procedures (including critical care time)  Medications Ordered in ED Medications  ketorolac (TORADOL) 15 MG/ML injection 15 mg (15 mg Intramuscular Given 06/26/18 1816)     Initial Impression / Assessment and Plan / ED Course  I have reviewed the triage vital signs and the nursing notes.  Pertinent labs & imaging results that were available during my care of the patient were reviewed by me and considered in my medical decision making (see chart for details).        Patient presenting for evaluation of low back pain.  Physical exam reassuring, neurovascularly intact.  No red flags for back pain.  Pain is reproducible with palpation of the musculature.  Likely musculoskeletal.  Doubt fracture, I do not believe x-rays will be beneficial.  Doubt vertebral injury, infection, spinal cord compression, myelopathy, or cauda equina syndrome.  Will treat symptomatically with NSAIDs, muscle relaxers, lidocaine patches.  Patient given information to follow-up with orthopedics.  At this time, patient appears safe for  discharge.  Return precautions given.  Patient states he understands agrees plan.   Final Clinical Impressions(s) / ED Diagnoses   Final diagnoses:  Acute right-sided low back pain without sciatica    ED Discharge Orders         Ordered    naproxen (NAPROSYN) 500 MG tablet  2 times daily with meals     06/26/18 1803    methocarbamol (ROBAXIN) 500 MG tablet  2 times daily PRN     06/26/18 1803    lidocaine (LIDODERM) 5 %  Every 24 hours     06/26/18 1804           Alveria Apley, PA-C 06/26/18 1933    Tegeler, Canary Brim, MD 06/26/18 1949

## 2018-06-26 NOTE — Discharge Instructions (Signed)
Take naproxen 2 times a day with meals.  Do not take other anti-inflammatories at the same time (Advil, Motrin, ibuprofen, Aleve). You may supplement with Tylenol if you need further pain control. Use Robaxin as needed for muscle stiffness or soreness. Have caution, as this may make you tired or groggy. Do not driver or operate heavy machinery while taking this medication.  Use lidocaine patches for pain control. Use muscle creams (bengay, icy hot, salonpas) as needed for pain.  Use heat/ice to help with pain control.  Follow up with orthopedics if your pain is not improving.  Return to the ER if you develop high fevers, numbness, loss of bowel or bladder control, or any new or concerning symptoms.
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
## 2018-07-24 ENCOUNTER — Ambulatory Visit (INDEPENDENT_AMBULATORY_CARE_PROVIDER_SITE_OTHER): Payer: Medicaid Other | Admitting: Pulmonary Disease

## 2018-07-24 ENCOUNTER — Encounter: Payer: Self-pay | Admitting: Pulmonary Disease

## 2018-07-24 ENCOUNTER — Other Ambulatory Visit: Payer: Self-pay

## 2018-07-24 VITALS — BP 128/82 | HR 91 | Temp 98.6°F | Ht 68.0 in | Wt 243.0 lb

## 2018-07-24 DIAGNOSIS — G4733 Obstructive sleep apnea (adult) (pediatric): Secondary | ICD-10-CM | POA: Diagnosis not present

## 2018-07-24 NOTE — Assessment & Plan Note (Signed)
Assessment: Moderate obstructive sleep apnea based off a 2015 sleep study Patient benefited from a set CPAP pressure of 13 per that report Patient has good compliance on CPAP today Patient using auto titrating pressure 5-20 Patient is having issues with mask fit  Plan: Mask fitting with Marita Kansas in sleep lab Follow-up with our office in 6 months or sooner if patient continues to have problems Continue CPAP therapy

## 2018-07-24 NOTE — Patient Instructions (Addendum)
Mask fitting ordered in [facility] sleep lab  >>>with vernon  >>>08/27/2018 :30pm  >>>[phone]  We recommend that you continue using your CPAP daily >>>Keep up the hard work using your device >>> Goal should be wearing this for the entire night that you are sleeping, at least 4 to 6 hours  Remember:  . Do not drive or operate heavy machinery if tired or drowsy.  . Please notify the supply company and office if you are unable to use your device regularly due to missing supplies or machine being broken.  . Work on maintaining a healthy weight and following your recommended nutrition plan  . Maintain proper daily exercise and movement  . Maintaining proper use of your device can also help improve management of other chronic illnesses such as: Blood pressure, blood sugars, and weight management.   BiPAP/ CPAP Cleaning:  >>>Clean weekly, with Dawn soap, and bottle brush.  Set up to air dry.   Return in about 6 months (around 01/24/2019), or if symptoms worsen or fail to improve, for Follow up with Dr. Craige Cotta, Follow up with Elisha Headland FNP-C.   Coronavirus (COVID-19) Are you at risk?  Are you at risk for the Coronavirus (COVID-19)?  To be considered HIGH RISK for Coronavirus (COVID-19), you have to meet the following criteria:  . Traveled to [place], [place], [place], [place] or [place]; or in the [place] to [place], [place], [place], or [place]; and have fever, cough, and shortness of breath within the last 2 weeks of travel OR . Been in close contact with a person diagnosed with COVID-19 within the last 2 weeks and have fever, cough, and shortness of breath . IF YOU DO NOT MEET THESE CRITERIA, YOU ARE CONSIDERED LOW RISK FOR COVID-19.  What to do if you are HIGH RISK for COVID-19?  Marland Kitchen If you are having a medical emergency, call 911. . Seek medical care right away. Before you go to a doctor's office, urgent care or emergency department, call ahead and tell them about your recent  travel, contact with someone diagnosed with COVID-19, and your symptoms. You should receive instructions from your physician's office regarding next steps of care.  . When you arrive at healthcare provider, tell the healthcare staff immediately you have returned from visiting [place], [place], [place], [place] or [place]; or traveled in the [place] to [place], [place], [place], or [place]; in the last two weeks or you have been in close contact with a person diagnosed with COVID-19 in the last 2 weeks.   . Tell the health care staff about your symptoms: fever, cough and shortness of breath. . After you have been seen by a medical provider, you will be either: o Tested for (COVID-19) and discharged home on quarantine except to seek medical care if symptoms worsen, and asked to  - Stay home and avoid contact with others until you get your results (4-5 days)  - Avoid travel on public transportation if possible (such as bus, train, or airplane) or o Sent to the Emergency Department by EMS for evaluation, COVID-19 testing, and possible admission depending on your condition and test results.  What to do if you are LOW RISK for COVID-19?  Reduce your risk of any infection by using the same precautions used for avoiding the common cold or flu:  Marland Kitchen Wash your hands often with soap and warm water for at least 20 seconds.  If soap and water are not  readily available, use an alcohol-based hand sanitizer with at least 60% alcohol.  . If coughing or sneezing, cover your mouth and nose by coughing or sneezing into the elbow areas of your shirt or coat, into a tissue or into your sleeve (not your hands). . Avoid shaking hands with others and consider head nods or verbal greetings only. . Avoid touching your eyes, nose, or mouth with unwashed hands.  . Avoid close contact with people who are sick. . Avoid places or events with large numbers of people in one location, like concerts or sporting  events. . Carefully consider travel plans you have or are making. . If you are planning any travel outside or inside the [place], visit the CDC's Travelers' Health webpage for the latest health notices. . If you have some symptoms but not all symptoms, continue to monitor at home and seek medical attention if your symptoms worsen. . If you are having a medical emergency, call 911.   ADDITIONAL HEALTHCARE OPTIONS FOR PATIENTS  [facility] / e-Visit: [email or web address]         [facility]: [phone]  [facility]: [phone]                   [facility]: [phone]           It is flu season:   >>> Best ways to protect herself from the flu: Receive the yearly flu vaccine, practice good hand hygiene washing with soap and also using hand sanitizer when available, eat a nutritious meals, get adequate rest, hydrate appropriately   Please contact the office if your symptoms worsen or you have concerns that you are not improving.   Thank you for choosing [facility] for your healthcare, and for allowing [place] to partner with you on your healthcare journey. I am thankful to be able to provide care to you today.   Elisha Headland FNP-C     Living With Sleep Apnea Sleep apnea is a condition in which breathing pauses or becomes shallow during sleep. Sleep apnea is most commonly caused by a collapsed or blocked airway. People with sleep apnea snore loudly and have times when they gasp and stop breathing for 10 seconds or more during sleep. This happens over and over during the night. This disrupts your sleep and keeps your body from getting the rest that it needs, which can cause tiredness and lack of energy (fatigue) during the day. The breaks in breathing also interrupt the deep sleep that you need to feel rested. Even if you do not completely wake up from the gaps in breathing, your sleep  may not be restful. You may also have a headache in the morning and low energy during the day, and you may feel anxious or depressed. How can sleep apnea affect me? Sleep apnea increases your chances of extreme tiredness during the day (daytime fatigue). It can also increase your risk for health conditions, such as:  Heart attack.  Stroke.  Diabetes.  Heart failure.  Irregular heartbeat.  High blood pressure. If you have daytime fatigue as a result of sleep apnea, you may be more likely to:  Perform poorly at school or work.  Fall asleep while driving.  Have difficulty with attention.  Develop depression or anxiety.  Become severely overweight (obese).  Have sexual dysfunction. What actions can I take to manage sleep apnea? Sleep apnea treatment   If you were given a device to open  your airway while you sleep, use it only as told by your health care provider. You may be given: ? An oral appliance. This is a custom-made mouthpiece that shifts your lower jaw forward. ? A continuous positive airway pressure (CPAP) device. This device blows air through a mask when you breathe out (exhale). ? A nasal expiratory positive airway pressure (EPAP) device. This device has valves that you put into each nostril. ? A bi-level positive airway pressure (BPAP) device. This device blows air through a mask when you breathe in (inhale) and breathe out (exhale).  You may need surgery if other treatments do not work for you. Sleep habits  Go to sleep and wake up at the same time every day. This helps set your internal clock (circadian rhythm) for sleeping. ? If you stay up later than usual, such as on weekends, try to get up in the morning within 2 hours of your normal wake time.  Try to get at least 7-9 hours of sleep each night.  Stop computer, tablet, and mobile phone use a few hours before bedtime.  Do not take long naps during the day. If you nap, limit it to 30 minutes.  Have a  relaxing bedtime routine. Reading or listening to music may relax you and help you sleep.  Use your bedroom only for sleep. ? Keep your television and computer out of your bedroom. ? Keep your bedroom cool, dark, and quiet. ? Use a supportive mattress and pillows.  Follow your health care provider's instructions for other changes to sleep habits. Nutrition  Do not eat heavy meals in the evening.  Do not have caffeine in the later part of the day. The effects of caffeine can last for more than 5 hours.  Follow your health care provider's or dietitian's instructions for any diet changes. Lifestyle      Do not drink alcohol before bedtime. Alcohol can cause you to fall asleep at first, but then it can cause you to wake up in the middle of the night and have trouble getting back to sleep.  Do not use any products that contain nicotine or tobacco, such as cigarettes and e-cigarettes. If you need help quitting, ask your health care provider. Medicines  Take over-the-counter and prescription medicines only as told by your health care provider.  Do not use over-the-counter sleep medicine. You can become dependent on this medicine, and it can make sleep apnea worse.  Do not use medicines, such as sedatives and narcotics, unless told by your health care provider. Activity  Exercise on most days, but avoid exercising in the evening. Exercising near bedtime can interfere with sleeping.  If possible, spend time outside every day. Natural light helps regulate your circadian rhythm. General information  Lose weight if you need to, and maintain a healthy weight.  Keep all follow-up visits as told by your health care provider. This is important.  If you are having surgery, make sure to tell your health care provider that you have sleep apnea. You may need to bring your device with you. Where to find more information Learn more about sleep apnea and daytime fatigue from:  American Sleep  Association: sleepassociation.org  National Sleep Foundation: sleepfoundation.org  National Heart, Lung, and Blood Institute: [employer]DryCleaner.gl Summary  Sleep apnea can cause daytime fatigue and other serious health conditions.  Both sleep apnea and daytime fatigue can be bad for your health and well-being.  You may need to wear a device while sleeping to help  keep your airway open.  If you are having surgery, make sure to tell your health care provider that you have sleep apnea. You may need to bring your device with you.  Making changes to sleep habits, diet, lifestyle, and activity can help you manage sleep apnea. This information is not intended to replace advice given to you by your health care provider. Make sure you discuss any questions you have with your health care provider. Document Released: 05/30/2017 Document Revised: 11/05/2017 Document Reviewed: 05/30/2017 Elsevier Interactive Patient Education  2019 Elsevier Inc.     CPAP and BPAP Information CPAP and BPAP are methods of helping a person breathe with the use of air pressure. CPAP stands for "continuous positive airway pressure." BPAP stands for "bi-level positive airway pressure." In both methods, air is blown through your nose or mouth and into your air passages to help you breathe well. CPAP and BPAP use different amounts of pressure to blow air. With CPAP, the amount of pressure stays the same while you breathe in and out. With BPAP, the amount of pressure is increased when you breathe in (inhale) so that you can take larger breaths. Your health care provider will recommend whether CPAP or BPAP would be more helpful for you. Why are CPAP and BPAP treatments used? CPAP or BPAP can be helpful if you have:  Sleep apnea.  Chronic obstructive pulmonary disease (COPD).  Heart failure.  Medical conditions that weaken the muscles of the chest including muscular dystrophy, or neurological diseases such as amyotrophic  lateral sclerosis (ALS).  Other problems that cause breathing to be weak, abnormal, or difficult. CPAP is most commonly used for obstructive sleep apnea (OSA) to keep the airways from collapsing when the muscles relax during sleep. How is CPAP or BPAP administered? Both CPAP and BPAP are provided by a small machine with a flexible plastic tube that attaches to a plastic mask. You wear the mask. Air is blown through the mask into your nose or mouth. The amount of pressure that is used to blow the air can be adjusted on the machine. Your health care provider will determine the pressure setting that should be used based on your individual needs. When should CPAP or BPAP be used? In most cases, the mask only needs to be worn during sleep. Generally, the mask needs to be worn throughout the night and during any daytime naps. People with certain medical conditions may also need to wear the mask at other times when they are awake. Follow instructions from your health care provider about when to use the machine. What are some tips for using the mask?   Because the mask needs to be snug, some people feel trapped or closed-in (claustrophobic) when first using the mask. If you feel this way, you may need to get used to the mask. One way to do this is by holding the mask loosely over your nose or mouth and then gradually applying the mask more snugly. You can also gradually increase the amount of time that you use the mask.  Masks are available in various types and sizes. Some fit over your mouth and nose while others fit over just your nose. If your mask does not fit well, talk with your health care provider about getting a different one.  If you are using a mask that fits over your nose and you tend to breathe through your mouth, a chin strap may be applied to help keep your mouth closed.  The CPAP  and BPAP machines have alarms that may sound if the mask comes off or develops a leak.  If you have trouble  with the mask, it is very important that you talk with your health care provider about finding a way to make the mask easier to tolerate. Do not stop using the mask. Stopping the use of the mask could have a negative impact on your health. What are some tips for using the machine?  Place your CPAP or BPAP machine on a secure table or stand near an electrical outlet.  Know where the on/off switch is located on the machine.  Follow instructions from your health care provider about how to set the pressure on your machine and when you should use it.  Do not eat or drink while the CPAP or BPAP machine is on. Food or fluids could get pushed into your lungs by the pressure of the CPAP or BPAP.  Do not smoke. Tobacco smoke residue can damage the machine.  For home use, CPAP and BPAP machines can be rented or purchased through home health care companies. Many different brands of machines are available. Renting a machine before purchasing may help you find out which particular machine works well for you.  Keep the CPAP or BPAP machine and attachments clean. Ask your health care provider for specific instructions. Get help right away if:  You have redness or open areas around your nose or mouth where the mask fits.  You have trouble using the CPAP or BPAP machine.  You cannot tolerate wearing the CPAP or BPAP mask.  You have pain, discomfort, and bloating in your abdomen. Summary  CPAP and BPAP are methods of helping a person breathe with the use of air pressure.  Both CPAP and BPAP are provided by a small machine with a flexible plastic tube that attaches to a plastic mask.  If you have trouble with the mask, it is very important that you talk with your health care provider about finding a way to make the mask easier to tolerate. This information is not intended to replace advice given to you by your health care provider. Make sure you discuss any questions you have with your health care  provider. Document Released: 12/02/2003 Document Revised: 11/05/2017 Document Reviewed: 01/23/2016 Elsevier Interactive Patient Education  2019 [employer].

## 2018-07-25 ENCOUNTER — Telehealth: Payer: Self-pay | Admitting: Pulmonary Disease

## 2018-07-25 NOTE — Telephone Encounter (Signed)
Sending to [facility] pool to f/u.

## 2018-07-28 NOTE — Telephone Encounter (Signed)
Arlys John has now signed the CMN and it has been faxed to [employer]. I received confirmation that it was received

## 2018-07-28 NOTE — Telephone Encounter (Addendum)
I had been waiting for the patient to be seen. Justin [name] saw the patient on 07/24/18. I have given the form to him to sign. LOV was 11/05/16 with Dr. Craige Cotta

## 2018-08-09 ENCOUNTER — Other Ambulatory Visit: Payer: Self-pay

## 2018-08-09 ENCOUNTER — Encounter (HOSPITAL_COMMUNITY): Payer: Self-pay | Admitting: Emergency Medicine

## 2018-08-09 ENCOUNTER — Emergency Department (HOSPITAL_COMMUNITY)
Admission: EM | Admit: 2018-08-09 | Discharge: 2018-08-09 | Disposition: A | Discharge status: Home / Self Care | Payer: Medicaid Other | Attending: Emergency Medicine | Admitting: Emergency Medicine

## 2018-08-09 DIAGNOSIS — Z79899 Other long term (current) drug therapy: Secondary | ICD-10-CM | POA: Diagnosis not present

## 2018-08-09 DIAGNOSIS — M5441 Lumbago with sciatica, right side: Secondary | ICD-10-CM

## 2018-08-09 DIAGNOSIS — M545 Low back pain: Secondary | ICD-10-CM | POA: Diagnosis present

## 2018-08-09 MED ORDER — METHOCARBAMOL 500 MG PO TABS: 500.0000 mg | ORAL_TABLET | Freq: Two times a day (BID) | ORAL | 0 refills | Status: DC

## 2018-08-09 MED ORDER — LIDOCAINE 5 % EX PTCH
1.0000 | MEDICATED_PATCH | CUTANEOUS | Status: DC
  Administered 2018-08-09: 1 via TRANSDERMAL
  Filled 2018-08-09: qty 1

## 2018-08-09 MED ORDER — PREDNISONE 10 MG PO TABS: 40.0000 mg | ORAL_TABLET | Freq: Every day | ORAL | 0 refills | Status: AC

## 2018-08-09 MED ORDER — KETOROLAC TROMETHAMINE 15 MG/ML IJ SOLN
15.0000 mg | Freq: Once | INTRAMUSCULAR | Status: AC
  Administered 2018-08-09: 15:00:00 15 mg via INTRAMUSCULAR
  Filled 2018-08-09: qty 1

## 2018-08-09 MED ORDER — LIDOCAINE 5 % EX PTCH: 1.0000 | MEDICATED_PATCH | CUTANEOUS | 0 refills | Status: DC

## 2018-08-09 NOTE — Discharge Instructions (Addendum)
You have been diagnosed today with Low Right-sided back pain with Sciatica.  At this time there does not appear to be the presence of an emergent medical condition, however there is always the potential for conditions to change. Please read and follow the below instructions.  Please return to the Emergency Department immediately for any new or worsening symptoms. Please be sure to follow up with your Primary Care Provider within one week regarding your visit today; please call their office to schedule an appointment even if you are feeling better for a follow-up visit. You have been given a medication called Toradol today this is an NSAID-containing medication.  Please avoid further NSAID use for the next 2 days and drink plenty of water. You may use the muscle relaxer Robaxin as prescribed to help with your pain.  This medication may take a few days to begin working.  Do not drive or operate heavy machinery while taking this medication as will make you drowsy.  Do not take this medication with other sedating medications or with alcohol as this will exacerbate side effects. You may use the medication prednisone as prescribed to help with your sciatic-like symptoms, 40 mg a day for the next 5 days. Use the Lidoderm patches as prescribed over your sore muscles on your right lower back to help with pain. Please call your orthopedic specialist Dr. Ophelia Charter on Monday to schedule a follow-up appointment regarding your pain.  Return to the emergency department if you have any new or worsening symptoms sooner.  Get help right away if: You cannot control when you pee (urinate) or poop (have a bowel movement). You have weakness in any of these areas and it gets worse. Lower back. Lower belly (pelvis). Butt (buttocks). Legs. You have redness or swelling of your back. You have a burning feeling when you pee. Get help right away if: You develop new bowel or bladder control problems. You have unusual weakness  or numbness in your arms or legs. You develop nausea or vomiting. You develop abdominal pain. You feel faint. You have fever Any new/concerning or worsening symptoms   Please read the additional information packets attached to your discharge summary.  Do not take your medicine if  develop an itchy rash, swelling in your mouth or lips, or difficulty breathing. Call 911/seek immediate emergency medical attention if this occurs.

## 2018-08-09 NOTE — ED Provider Notes (Addendum)
[facility] EMERGENCY DEPARTMENT Provider Note   CSN: [id number] Arrival date & time: 08/09/18  1418    History   Chief Complaint Chief Complaint  Patient presents with  . low back pain    HPI Justin [name] is a 53 y.o. male with history of lumbar spinal fusion performed by Dr. Ophelia Charter in October 2019 presents today for lower back pain.  Patient reported history of chronic lower back pain primarily on the right side.  He reports that his current exacerbation began 7 days ago after a getting out of bed.  Patient reports he has had a constant sharp aching pain in his right lower back worsened with leaning forward and movement and improved with leaning backwards moderate in intensity.  Patient reports that he will occasionally have a shooting tingling sensation down his right leg to his right big toe that improves gradually.  Patient reports that he intends to call Dr. Ophelia Charter on Monday to schedule a follow-up appointment.  Patient requesting pain medication to bridge him until Monday.  Patient denies injury/trauma, fever, IV drug use, abdominal pain, dysuria/hematuria, saddle area paresthesias, urinary retention, bowel/bladder incontinence or change to the nature of his chronic lower back pain.     HPI  Past Medical History:  Diagnosis Date  . Achilles rupture, left   . Bipolar 1 disorder (HCC)   . Dental caries    periodontitis  . Pneumonia   . Pulmonary embolism (HCC)   . Sleep apnea    wears CPAP  . Subdural hematoma (HCC) 2015    Patient Active Problem List   Diagnosis Date Noted  . Status post lumbar spinal fusion 01/03/2018  . Lumbar stenosis 12/23/2017  . Spondylolisthesis, lumbar region 06/25/2017  . Achilles tendinitis of left lower extremity 12/07/2016  . Achilles tendinitis, left leg 05/17/2016  . Obstructive sleep apnea on CPAP 03/07/2015  . Post-operative state 03/07/2015  . Achilles rupture, left 10/21/2014  . OSA (obstructive sleep apnea)  04/23/2013  . Bipolar disorder, unspecified (HCC) 01/07/2013  . Sinus tachycardia 01/07/2013  . Headache(784.0) 01/07/2013  . SDH (subdural hematoma) (HCC) 01/04/2013  . H/O PE 01/04/2013  . Chronic anticoagulation 01/04/2013  . 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  . Acute sinusitis 01/04/2013    Past Surgical History:  Procedure Laterality Date  . ACHILLES TENDON SURGERY Left 10/21/2014   Procedure: Left Achilles Reconstruction;  Surgeon: Nadara Mustard, MD;  Location: [facility] OR;  Service: Orthopedics;  Laterality: Left;  . APPENDECTOMY    . CRANIOTOMY N/A 01/19/2013   Procedure: CRANIOTOMY HEMATOMA EVACUATION SUBDURAL;  Surgeon: Hewitt Shorts, MD;  Location: [facility];  Service: Neurosurgery;  Laterality: N/A;  . CYSTECTOMY     right head  . ELBOW SURGERY     right  . FRACTURE SURGERY     finger  . I&D EXTREMITY Left 12/07/2016   Procedure: LEFT ACHILLES DEBRIDEMENT;  Surgeon: Nadara Mustard, MD;  Location: [facility] OR;  Service: Orthopedics;  Laterality: Left;  . LUMBAR FUSION  12/23/2017   L5 GILL PROCEDURE, RIGHT L5-S1, TRANSFORAMIAL LUMBAR INTERBODY FUSION, BILATERAL LATERAL FUSION, PEDICLE INSTRUMENTATION  . MULTIPLE EXTRACTIONS WITH ALVEOLOPLASTY N/A 03/07/2015   Procedure: MULTIPLE EXTRACTION WITH ALVEOLOPLASTY;  Surgeon: [name], DDS;  Location: [facility] OR;  Service: Oral Surgery;  Laterality: N/A;        Home Medications    Prior to Admission medications   Medication Sig Start Date End Date Taking? Authorizing Provider  doxepin (SINEQUAN) 10 MG capsule  Take 20 mg by mouth at bedtime.  07/15/17   [provider]  lidocaine (LIDODERM) 5 % Place 1 patch onto the skin daily. Remove & Discard patch within 12 hours or as directed by MD 08/09/18   Harlene SaltsMorelli, Kaci Dillie A, PA-C  lithium 600 MG capsule Take 600 mg by mouth at bedtime.    [provider]  methocarbamol (ROBAXIN) 500 MG tablet Take 1 tablet (500 mg total) by mouth 2 (two) times daily. 08/09/18    Harlene SaltsMorelli, Makya Yurko A, PA-C  mirtazapine (REMERON) 45 MG tablet Take 45 mg by mouth at bedtime.    [provider]  naproxen (NAPROSYN) 500 MG tablet Take 1 tablet (500 mg total) by mouth 2 (two) times daily with a meal. Patient not taking: Reported on 07/24/2018 06/26/18   Caccavale, Sophia, PA-C  predniSONE (DELTASONE) 10 MG tablet Take 4 tablets (40 mg total) by mouth daily for 5 days. 08/09/18 08/14/18  Harlene SaltsMorelli, Merriam Brandner A, PA-C  QUEtiapine (SEROQUEL XR) 400 MG 24 hr tablet Take 800 mg by mouth at bedtime.    [provider]    Family History Family History  Problem Relation Age of Onset  . Hypertension Mother   . Stroke Mother   . Multiple sclerosis Sister   . Down syndrome Son     Social History Social History   Tobacco Use  . Smoking status: Never Smoker  . Smokeless tobacco: Never Used  Substance Use Topics  . Alcohol use: No  . Drug use: No     Allergies   Patient has no known allergies.   Review of Systems Review of Systems  Constitutional: Negative.  Negative for chills and fever.  Respiratory: Negative.  Negative for cough and shortness of breath.   Cardiovascular: Negative.  Negative for chest pain.  Gastrointestinal: Negative.  Negative for abdominal pain, nausea and vomiting.  Genitourinary: Negative.  Negative for dysuria and hematuria.  Musculoskeletal: Positive for back pain. Negative for neck pain.  Neurological: Positive for numbness (Tingling down right leg occasionally with severe pain). Negative for weakness and headaches.       Denies saddle or paresthesias Denies bowel/bladder incontinence Denies urinary retention   Physical Exam Updated Vital Signs BP (!) 142/102   Pulse 89   Temp 98.2 F (36.8 C) (Oral)   Resp 18   Ht 5\' 8"  (1.727 m)   Wt 110.2 kg   SpO2 96%   BMI 36.95 kg/m   Physical Exam Constitutional:      General: He is not in acute distress.    Appearance: Normal appearance. He is obese. He is not ill-appearing or  diaphoretic.  HENT:     Head: Normocephalic and atraumatic. No raccoon eyes or Battle's sign.     Jaw: There is normal jaw occlusion.     Right Ear: External ear normal.     Left Ear: External ear normal.     Nose: Nose normal.  Eyes:     General: Vision grossly intact. Gaze aligned appropriately.     Conjunctiva/sclera: Conjunctivae normal.     Pupils: Pupils are equal, round, and reactive to light.  Neck:     Musculoskeletal: Normal range of motion and neck supple. No neck rigidity.     Trachea: Trachea and phonation normal. No tracheal tenderness or tracheal deviation.  Cardiovascular:     Rate and Rhythm: Normal rate and regular rhythm.     Pulses:          Radial pulses are  2+ on the right side and 2+ on the left side.       Dorsalis pedis pulses are 2+ on the right side and 2+ on the left side.       Posterior tibial pulses are 2+ on the right side and 2+ on the left side.     Heart sounds: Normal heart sounds.  Pulmonary:     Effort: Pulmonary effort is normal. No accessory muscle usage or respiratory distress.     Breath sounds: Normal air entry.  Abdominal:     General: There is no distension.     Palpations: Abdomen is soft. There is no pulsatile mass.     Tenderness: There is no abdominal tenderness. There is no guarding or rebound.  Musculoskeletal:       Back:     Comments: No midline C/T/L spinal tenderness to palpation no deformity, crepitus, or step-off noted. No sign of injury to the neck or back. - Hips stable to compression bilaterally without pain. - Patient with reproducible tenderness to the right gluteal musculature without sign of injury.  Positive straight leg test right.  Feet:     Right foot:     Protective Sensation: 3 sites tested. 3 sites sensed.     Left foot:     Protective Sensation: 3 sites tested. 3 sites sensed.  Skin:    General: Skin is warm and dry.     Capillary Refill: Capillary refill takes less than 2 seconds.        Neurological:     Mental Status: He is alert.     GCS: GCS eye subscore is 4. GCS verbal subscore is 5. GCS motor subscore is 6.     Comments: Speech is clear and goal oriented, follows commands Major Cranial nerves without deficit, no facial droop Normal strength in upper and lower extremities bilaterally including dorsiflexion and plantar flexion, strong and equal grip strength Sensation normal to light and sharp touch Moves extremities without ataxia, coordination intact Slow steady gait  Psychiatric:        Behavior: Behavior is cooperative.     ED Treatments / Results  Labs (all labs ordered are listed, but only abnormal results are displayed) Labs Reviewed - No data to display  EKG None  Radiology No results found.  Procedures Procedures (including critical care time)  Medications Ordered in ED Medications  lidocaine (LIDODERM) 5 % 1 patch (1 patch Transdermal Patch Applied 08/09/18 1445)  ketorolac (TORADOL) 15 MG/ML injection 15 mg (15 mg Intramuscular Given 08/09/18 1446)     Initial Impression / Assessment and Plan / ED Course  I have reviewed the triage vital signs and the nursing notes.  Pertinent labs & imaging results that were available during my care of the patient were reviewed by me and considered in my medical decision making (see chart for details).    Justin [name] is a 53 y.o. male presenting with right-sided back pain.  He has history of chronic lower back pain with lumbar spinal fusion in October.  He presents for pain which she describes as his typical chronic lower back pain.  He was seen here in the emergency department on 06/26/2018 for similar symptoms, he was treated with Toradol, Robaxin, naproxen and Lidoderm patches at that time with improvement.  Current pain exacerbation began 1 week ago after getting out of bed.  He reports occasional sciatic-like symptoms shooting down his right leg.  Patient denies history of trauma, fever, IV drug  use,  night sweats, weight loss, cancer, saddle anesthesia, urinary rentention, bowel/bladder incontinence. No neurological deficits and normal neuro exam.  Patient does report some worsening pain with leaning forward however this pain is in his right gluteal region, on my exam he is able to lean forward and has no tingling or worsening of his pain and normal neuro exam do not suspect spinal cord compression at this time.  Suspect musculoskeletal etiology of patient's pain, acute on chronic right-sided lower back pain with sciatica. Pain is consistently reproducible with palpation of the back musculature. Abdomen soft/nontender and without pulsatile mass. Patient with equal pedal pulses. Doubt spinal epidural abscess, infection, cauda equina or AAA.  Imaging not indicated at this time.  Patient is ambulatory in the emergency department without assistance. RICE protocol and pain medicine indicated and discussed with patient.   Patient aware that he has been given Toradol 15 mg today and he should avoid naproxen or other NSAID use for the next 2 days.  He is aware to drink increased water.  Lidoderm for muscular tenderness.  Robaxin 500mg  BID prescribed. Patient informed to avoid driving or operating heavy machinery while taking muscle relaxer.  Patient prescribed prednisone for his sciatic-like symptoms, he denies history of adverse reaction to steroid medications or history of diabetes.  Patient encouraged to call his orthopedist on Monday as planned to schedule follow-up appointment.  At this time there does not appear to be any evidence of an acute emergency medical condition and the patient appears stable for discharge with appropriate outpatient follow up. Diagnosis was discussed with patient who verbalizes understanding of care plan and is agreeable to discharge. I have discussed return precautions with patient who verbalizes understanding of return precautions. Patient encouraged to follow-up with  their PCP and ortho. All questions answered.  Patient has been discharged in good condition.  Note: Portions of this report may have been transcribed using voice recognition software. Every effort was made to ensure accuracy; however, inadvertent computerized transcription errors may still be present. Final Clinical Impressions(s) / ED Diagnoses   Final diagnoses:  Acute right-sided low back pain with right-sided sciatica    ED Discharge Orders         Ordered    predniSONE (DELTASONE) 10 MG tablet  Daily     08/09/18 1537    methocarbamol (ROBAXIN) 500 MG tablet  2 times daily     08/09/18 1537    lidocaine (LIDODERM) 5 %  Every 24 hours     08/09/18 1537           Bill Salinas, PA-C 08/09/18 1605    [address] 08/09/18 1614    Margarita Grizzle, MD 08/11/18 2026

## 2018-08-09 NOTE — ED Notes (Signed)
Patient verbalizes understanding of discharge instructions . Opportunity for questions and answers were provided . Armband removed by staff ,Pt discharged from ED. W/C  offered at D/C  and Declined W/C at D/C and was escorted to lobby by RN.  

## 2018-08-09 NOTE — ED Triage Notes (Signed)
Pt in with c/o low back pain, chronic in nature. Denies any numbness or tingling in LE's. States pain meds haven't been effective

## 2018-08-25 ENCOUNTER — Other Ambulatory Visit: Payer: Self-pay

## 2018-08-25 ENCOUNTER — Other Ambulatory Visit (HOSPITAL_COMMUNITY)
Admission: RE | Admit: 2018-08-25 | Discharge: 2018-08-25 | Disposition: A | Discharge status: Home / Self Care | Payer: Medicaid Other | Source: Ambulatory Visit | Attending: Pulmonary Disease | Admitting: Pulmonary Disease

## 2018-08-25 DIAGNOSIS — Z1159 Encounter for screening for other viral diseases: Secondary | ICD-10-CM | POA: Insufficient documentation

## 2018-08-25 LAB — SARS CORONAVIRUS 2 BY RT PCR (HOSPITAL ORDER, PERFORMED IN ~~LOC~~ HOSPITAL LAB): SARS Coronavirus 2: NEGATIVE

## 2018-08-27 ENCOUNTER — Other Ambulatory Visit: Payer: Self-pay

## 2018-08-27 ENCOUNTER — Ambulatory Visit (HOSPITAL_BASED_OUTPATIENT_CLINIC_OR_DEPARTMENT_OTHER): Payer: Medicaid Other | Attending: Pulmonary Disease | Admitting: Radiology

## 2018-08-27 DIAGNOSIS — G4733 Obstructive sleep apnea (adult) (pediatric): Secondary | ICD-10-CM

## 2018-09-02 ENCOUNTER — Ambulatory Visit (INDEPENDENT_AMBULATORY_CARE_PROVIDER_SITE_OTHER): Payer: Medicaid Other | Admitting: Orthopaedic Surgery

## 2018-09-02 ENCOUNTER — Ambulatory Visit (INDEPENDENT_AMBULATORY_CARE_PROVIDER_SITE_OTHER): Payer: Medicaid Other

## 2018-09-02 ENCOUNTER — Other Ambulatory Visit: Payer: Self-pay

## 2018-09-02 ENCOUNTER — Encounter: Payer: Self-pay | Admitting: Orthopaedic Surgery

## 2018-09-02 VITALS — Ht 69.0 in | Wt 246.0 lb

## 2018-09-02 DIAGNOSIS — M545 Low back pain, unspecified: Secondary | ICD-10-CM

## 2018-09-02 DIAGNOSIS — Z981 Arthrodesis status: Secondary | ICD-10-CM
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
## 2018-09-03 ENCOUNTER — Telehealth: Payer: Self-pay | Admitting: Orthopaedic Surgery
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
## 2018-09-03 NOTE — Telephone Encounter (Signed)
Please advise 

## 2018-09-26 ENCOUNTER — Telehealth: Payer: Self-pay | Admitting: Orthopaedic Surgery

## 2018-09-26 DIAGNOSIS — M545 Low back pain, unspecified: Secondary | ICD-10-CM

## 2018-09-26 DIAGNOSIS — Z981 Arthrodesis status: Secondary | ICD-10-CM

## 2018-09-26 NOTE — Telephone Encounter (Signed)
Patient called asked if he can be set up for an MRI. Patient said he is still in a lot of pain. The number to contact patient is  [phone]

## 2018-09-26 NOTE — Telephone Encounter (Signed)
Order entered. I called patient and advised. 

## 2018-09-26 NOTE — Telephone Encounter (Signed)
Please advise 

## 2018-09-26 NOTE — Telephone Encounter (Signed)
Ok to seek approval for MIRI lumbar ,  Post fusion with continued pain thanks

## 2018-10-01 ENCOUNTER — Telehealth: Payer: Self-pay | Admitting: Orthopaedic Surgery
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
## 2018-10-01 NOTE — Telephone Encounter (Signed)
Please review. thanks

## 2018-10-01 NOTE — Telephone Encounter (Signed)
I corrected. Ok to mail to pt and fax to [facility] thanks

## 2018-10-02 ENCOUNTER — Telehealth: Payer: Self-pay | Admitting: Orthopaedic Surgery

## 2018-10-02 NOTE — Telephone Encounter (Signed)
Patient called stated thank you for correcting the ov note however, the corrected dates should be 08/16/2017 & 08/17/2017. Please update and refax to atty. pts callback [phone]

## 2018-10-02 NOTE — Telephone Encounter (Signed)
Faxed to atty and copy mailed to patient.

## 2018-10-03 NOTE — Telephone Encounter (Signed)
Corrected, OK to send again thanks

## 2018-10-10 ENCOUNTER — Telehealth: Payer: Self-pay | Admitting: Orthopaedic Surgery

## 2018-10-10 NOTE — Telephone Encounter (Signed)
Patient called asked for a call back concerning date change and re-faxing information to his attorneys office. Please see 10/03/2018 note   The number to contact patient is [phone]

## 2018-10-13 NOTE — Telephone Encounter (Signed)
Dr Lorin Mercy amended 6/16 ov note again. Ov note faxed to pts atty [phone]

## 2018-10-20 ENCOUNTER — Encounter (HOSPITAL_COMMUNITY): Payer: Self-pay

## 2018-10-20 ENCOUNTER — Emergency Department (HOSPITAL_BASED_OUTPATIENT_CLINIC_OR_DEPARTMENT_OTHER): Payer: Medicaid Other

## 2018-10-20 ENCOUNTER — Emergency Department (HOSPITAL_COMMUNITY)
Admission: EM | Admit: 2018-10-20 | Discharge: 2018-10-21 | Disposition: A | Discharge status: Home / Self Care | Payer: Medicaid Other | Attending: Emergency Medicine | Admitting: Emergency Medicine

## 2018-10-20 ENCOUNTER — Other Ambulatory Visit: Payer: Self-pay

## 2018-10-20 DIAGNOSIS — I824Y1 Acute embolism and thrombosis of unspecified deep veins of right proximal lower extremity: Secondary | ICD-10-CM | POA: Diagnosis not present

## 2018-10-20 DIAGNOSIS — L538 Other specified erythematous conditions: Secondary | ICD-10-CM | POA: Diagnosis not present

## 2018-10-20 DIAGNOSIS — R0789 Other chest pain: Secondary | ICD-10-CM | POA: Insufficient documentation

## 2018-10-20 DIAGNOSIS — I808 Phlebitis and thrombophlebitis of other sites: Secondary | ICD-10-CM | POA: Diagnosis not present

## 2018-10-20 DIAGNOSIS — Z79899 Other long term (current) drug therapy: Secondary | ICD-10-CM | POA: Diagnosis not present

## 2018-10-20 DIAGNOSIS — Z86711 Personal history of pulmonary embolism: Secondary | ICD-10-CM | POA: Diagnosis not present

## 2018-10-20 DIAGNOSIS — R030 Elevated blood-pressure reading, without diagnosis of hypertension: Secondary | ICD-10-CM

## 2018-10-20 DIAGNOSIS — R2233 Localized swelling, mass and lump, upper limb, bilateral: Secondary | ICD-10-CM | POA: Diagnosis present

## 2018-10-20 DIAGNOSIS — I809 Phlebitis and thrombophlebitis of unspecified site: Secondary | ICD-10-CM

## 2018-10-20 DIAGNOSIS — M7989 Other specified soft tissue disorders: Secondary | ICD-10-CM

## 2018-10-20 LAB — COMPREHENSIVE METABOLIC PANEL
ALT: 29 U/L (ref 0–44)
AST: 33 U/L (ref 15–41)
Albumin: 4.4 g/dL (ref 3.5–5.0)
Alkaline Phosphatase: 83 U/L (ref 38–126)
Anion gap: 11 (ref 5–15)
BUN: 6 mg/dL (ref 6–20)
CO2: 21 mmol/L — ABNORMAL LOW (ref 22–32)
Calcium: 9.1 mg/dL (ref 8.9–10.3)
Chloride: 111 mmol/L (ref 98–111)
Creatinine, Ser: 1.58 mg/dL — ABNORMAL HIGH (ref 0.61–1.24)
GFR calc Af Amer: 56 mL/min — ABNORMAL LOW (ref >60)
GFR calc non Af Amer: 49 mL/min — ABNORMAL LOW (ref >60)
Glucose, Bld: 134 mg/dL — ABNORMAL HIGH (ref 70–99)
Potassium: 3.3 mmol/L — ABNORMAL LOW (ref 3.5–5.1)
Sodium: 143 mmol/L (ref 135–145)
Total Bilirubin: 0.6 mg/dL (ref 0.3–1.2)
Total Protein: 7.4 g/dL (ref 6.5–8.1)

## 2018-10-20 LAB — CBC WITH DIFFERENTIAL/PLATELET
Abs Immature Granulocytes: 0.03 10*3/uL (ref 0.00–0.07)
Basophils Absolute: 0.1 10*3/uL (ref 0.0–0.1)
Basophils Relative: 1 %
Eosinophils Absolute: 0.1 10*3/uL (ref 0.0–0.5)
Eosinophils Relative: 2 %
HCT: 39.1 % (ref 39.0–52.0)
Hemoglobin: 12.8 g/dL — ABNORMAL LOW (ref 13.0–17.0)
Immature Granulocytes: 0 %
Lymphocytes Relative: 27 %
Lymphs Abs: 2.4 10*3/uL (ref 0.7–4.0)
MCH: 28.1 pg (ref 26.0–34.0)
MCHC: 32.7 g/dL (ref 30.0–36.0)
MCV: 85.9 fL (ref 80.0–100.0)
Monocytes Absolute: 0.5 10*3/uL (ref 0.1–1.0)
Monocytes Relative: 5 %
Neutro Abs: 5.8 10*3/uL (ref 1.7–7.7)
Neutrophils Relative %: 65 %
Platelets: 254 10*3/uL (ref 150–400)
RBC: 4.55 MIL/uL (ref 4.22–5.81)
RDW: 13.8 % (ref 11.5–15.5)
WBC: 8.9 10*3/uL (ref 4.0–10.5)
nRBC: 0 % (ref 0.0–0.2)

## 2018-10-20 NOTE — Progress Notes (Addendum)
VASCULAR LAB PRELIMINARY  PRELIMINARY  PRELIMINARY  PRELIMINARY  Bilateral upper extremity venous duplex completed.    Preliminary report:  See CV proc for preliminary results.   Gave report to [facility], PA-C  Martine Bleecker, RVT 10/20/2018, 7:59 PM

## 2018-10-20 NOTE — ED Provider Notes (Signed)
[facility] Provider Note   CSN: [id number] Arrival date & time: 10/20/18  1713    History   Chief Complaint Chief Complaint  Patient presents with  . Knot on arm    HPI Justin [name] is a 53 y.o. male with history of pulmonary embolism, subdural hematoma, bipolar 1 disorder, sleep apnea presents today for concern of knots on bilateral upper extremities.  Patient reports over the past few days he has had spontaneous small knots appear on his bilateral forearms along his radius.  He reports that these change in size daily mildly erythematous, mild intermittent aching sensation, nonradiating and without clear aggravating or alleviating factors.  He reports that sometimes these knots will extend down to his wrists then wean back to their original size on his mid forearm.  He reports she has never had this before he denies any pruritus or allergen exposures.  He is concerned for blood clots today.  He reports that approximately [place] ago in [place] he had a pulmonary embolism without preceding extremity DVT.  He was on blood thinners for multiple years until [place] moved to [place] for [place] years ago and has not been on anticoagulants since that time.  He has not had a recurrence of DVT or pulmonary embolism.  Patient denies any chest pain or shortness of breath states that he is otherwise feeling well besides the knots on his arms.  He is very concerned for DVT today, he states that a close friend recently passed away from a pulmonary embolism.  Denies fever/chills, cough/hemoptysis, abdominal pain, nausea/vomiting, diarrhea, chest pain/shortness of breath or any additional concerns today.    HPI  Past Medical History:  Diagnosis Date  . Achilles rupture, left   . Bipolar 1 disorder (HCC)   . Dental caries    periodontitis  . Pneumonia   . Pulmonary embolism (HCC)   . Sleep apnea    wears CPAP  . Subdural  hematoma (HCC) 2015    Patient Active Problem List   Diagnosis Date Noted  . Status post lumbar spinal fusion 01/03/2018  . Lumbar stenosis 12/23/2017  . Spondylolisthesis, lumbar region 06/25/2017  . Achilles tendinitis of left lower extremity 12/07/2016  . Achilles tendinitis, left leg 05/17/2016  . Obstructive sleep apnea on CPAP 03/07/2015  . Post-operative state 03/07/2015  . Achilles rupture, left 10/21/2014  . OSA (obstructive sleep apnea) 04/23/2013  . Bipolar disorder, unspecified (HCC) 01/07/2013  . Sinus tachycardia 01/07/2013  . Headache(784.0) 01/07/2013  . SDH (subdural hematoma) (HCC) 01/04/2013  . H/O PE 01/04/2013  . Chronic anticoagulation 01/04/2013  . 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  . Acute sinusitis 01/04/2013    Past Surgical History:  Procedure Laterality Date  . ACHILLES TENDON SURGERY Left 10/21/2014   Procedure: Left Achilles Reconstruction;  Surgeon: Nadara MustardMarcus Duda V, MD;  [facility];  Service: Orthopedics;  Laterality: Left;  . APPENDECTOMY    . CRANIOTOMY N/A 01/19/2013   Procedure: CRANIOTOMY HEMATOMA EVACUATION SUBDURAL;  Surgeon: Hewitt Shortsobert W Nudelman, MD;  Location: [facility] NEURO ORS;  Service: Neurosurgery;  Laterality: N/A;  . CYSTECTOMY     right head  . ELBOW SURGERY     right  . FRACTURE SURGERY     finger  . I&D EXTREMITY Left 12/07/2016   Procedure: LEFT ACHILLES DEBRIDEMENT;  Surgeon: Nadara Mustarduda, Marcus V, MD;  [facility];  Service: Orthopedics;  Laterality: Left;  . LUMBAR FUSION  12/23/2017   L5 GILL  PROCEDURE, RIGHT L5-S1, TRANSFORAMIAL LUMBAR INTERBODY FUSION, BILATERAL LATERAL FUSION, PEDICLE INSTRUMENTATION  . MULTIPLE EXTRACTIONS WITH ALVEOLOPLASTY N/A 03/07/2015   Procedure: MULTIPLE EXTRACTION WITH ALVEOLOPLASTY;  Surgeon: [name], DDS;  Location: [facility] OR;  Service: Oral Surgery;  Laterality: N/A;        Home Medications    Prior to Admission medications   Medication Sig Start Date End Date Taking? Authorizing  Provider  doxepin (SINEQUAN) 10 MG capsule Take 20 mg by mouth at bedtime.  07/15/17   [provider]  HYDROcodone-acetaminophen (NORCO/VICODIN) 5-325 MG tablet  07/30/18   [provider]  lidocaine (LIDODERM) 5 % Place 1 patch onto the skin daily. Remove & Discard patch within 12 hours or as directed by MD Patient not taking: Reported on 09/02/2018 08/09/18   Harlene SaltsMorelli, Indy Prestwood A, PA-C  lithium 600 MG capsule Take 600 mg by mouth at bedtime.    [provider]  methocarbamol (ROBAXIN) 500 MG tablet Take 1 tablet (500 mg total) by mouth 2 (two) times daily. 08/09/18   Harlene SaltsMorelli, Christabel Camire A, PA-C  mirtazapine (REMERON) 45 MG tablet Take 45 mg by mouth at bedtime.    [provider]  naproxen (NAPROSYN) 500 MG tablet Take 1 tablet (500 mg total) by mouth 2 (two) times daily with a meal. Patient not taking: Reported on 07/24/2018 06/26/18   Caccavale, Sophia, PA-C  QUEtiapine (SEROQUEL XR) 400 MG 24 hr tablet Take 800 mg by mouth at bedtime.    [provider]    Family History Family History  Problem Relation Age of Onset  . Hypertension Mother   . Stroke Mother   . Multiple sclerosis Sister   . Down syndrome Son     Social History Social History   Tobacco Use  . Smoking status: Never Smoker  . Smokeless tobacco: Never Used  Substance Use Topics  . Alcohol use: No  . Drug use: No     Allergies   Patient has no known allergies.   Review of Systems Review of Systems Ten systems are reviewed and are negative for acute change except as noted in the HPI  Physical Exam Updated Vital Signs BP (!) 152/96   Pulse 80   Temp 98 F (36.7 C) (Oral)   Resp (!) 26   SpO2 97%   Physical Exam Constitutional:      General: He is not in acute distress.    Appearance: Normal appearance. He is well-developed. He is not ill-appearing or diaphoretic.  HENT:     Head: Normocephalic and atraumatic.     Right Ear: External ear normal.     Left Ear:  External ear normal.     Nose: Nose normal.  Eyes:     General: Vision grossly intact. Gaze aligned appropriately.     Pupils: Pupils are equal, round, and reactive to light.  Neck:     Musculoskeletal: Normal range of motion.     Trachea: Trachea and phonation normal. No tracheal deviation.  Cardiovascular:     Rate and Rhythm: Normal rate and regular rhythm.     Pulses: Normal pulses.          Radial pulses are 2+ on the right side and 2+ on the left side.       Dorsalis pedis pulses are 2+ on the right side and 2+ on the left side.     Heart sounds: Normal heart sounds.  Pulmonary:     Effort: Pulmonary effort is normal. No respiratory  distress.     Breath sounds: Normal breath sounds.  Abdominal:     General: There is no distension.     Palpations: Abdomen is soft.     Tenderness: There is no abdominal tenderness. There is no guarding or rebound.  Musculoskeletal: Normal range of motion.     Right lower leg: No edema.     Left lower leg: No edema.  Skin:    General: Skin is warm and dry.     Capillary Refill: Capillary refill takes less than 2 seconds.          Comments: Patient with what appears to be 2 small knots in each mid forearm, no skin break, mild erythema present.  Patient is incessantly rubbing these areas difficult to tell whether erythema is from him constantly rubbing his skin.  Neurological:     Mental Status: He is alert.     GCS: GCS eye subscore is 4. GCS verbal subscore is 5. GCS motor subscore is 6.     Comments: Speech is clear and goal oriented, follows commands Major Cranial nerves without deficit, no facial droop Moves extremities without ataxia, coordination intact  Psychiatric:        Behavior: Behavior normal.    ED Treatments / Results  Labs (all labs ordered are listed, but only abnormal results are displayed) Labs Reviewed  COMPREHENSIVE METABOLIC PANEL - Abnormal; Notable for the following components:      Result Value   Potassium 3.3  (*)    CO2 21 (*)    Glucose, Bld 134 (*)    Creatinine, Ser 1.58 (*)    GFR calc non Af Amer 49 (*)    GFR calc Af Amer 56 (*)    All other components within normal limits  CBC WITH DIFFERENTIAL/PLATELET - Abnormal; Notable for the following components:   Hemoglobin 12.8 (*)    All other components within normal limits    EKG EKG Interpretation  Date/Time:  Monday October 20 2018 21:57:10 EDT Ventricular Rate:  75 PR Interval:    QRS Duration: 113 QT Interval:  431 QTC Calculation: 482 R Axis:   -21 Text Interpretation:  Sinus rhythm Multiform ventricular premature complexes Consider anterior infarct When compared with ECG of 03/24/2018, QT has shortened Premature ventricular complexes are now present Confirmed by Dione BoozeGlick, David ([id number]) on 10/20/2018 11:31:10 PM   Radiology Ue Venous Duplex ([facility] Only)  Result Date: 10/20/2018 UPPER VENOUS STUDY  Indications: erythema low to mid forearm, bilaterally. Risk Factors: History of PE. Comparison Study: No prior study on file for comparison. Performing Technologist: Sherren Kernsandace Kanady RVS  Examination Guidelines: A complete evaluation includes B-mode imaging, spectral Doppler, color Doppler, and power Doppler as needed of all accessible portions of each vessel. Bilateral testing is considered an integral part of a complete examination. Limited examinations for reoccurring indications may be performed as noted.  Right Findings: +----------+------------+---------+-----------+----------+---------------------+ RIGHT     CompressiblePhasicitySpontaneousProperties       Summary        +----------+------------+---------+-----------+----------+---------------------+ IJV           Full       Yes       Yes                                    +----------+------------+---------+-----------+----------+---------------------+ Subclavian               Yes  Yes                                     +----------+------------+---------+-----------+----------+---------------------+ Axillary      Full       Yes       Yes                                    +----------+------------+---------+-----------+----------+---------------------+ Brachial      Full       No        No               sluggish flow/Unable                                                       to Doppler color or                                                            waveform        +----------+------------+---------+-----------+----------+---------------------+ Radial        Full                                                        +----------+------------+---------+-----------+----------+---------------------+ Ulnar                                                  patent by color                                                               Doppler        +----------+------------+---------+-----------+----------+---------------------+ Cephalic      Full                                                        +----------+------------+---------+-----------+----------+---------------------+ Basilic       Full                                                        +----------+------------+---------+-----------+----------+---------------------+ Brachial vein is compressible. However, sluggish flow is noted and color flow and Doppler flow is not noted. Disturbed tissue noted mid forearm.  Left  Findings: +----------+------------+---------+-----------+----------+--------------------+ LEFT      CompressiblePhasicitySpontaneousProperties      Summary        +----------+------------+---------+-----------+----------+--------------------+ IJV           Full       Yes       Yes                                   +----------+------------+---------+-----------+----------+--------------------+ Subclavian               Yes       Yes                                    +----------+------------+---------+-----------+----------+--------------------+ Axillary      Full       Yes       Yes                                   +----------+------------+---------+-----------+----------+--------------------+ Brachial      Full       Yes       Yes                Poor color fill    +----------+------------+---------+-----------+----------+--------------------+ Radial        Full                                                       +----------+------------+---------+-----------+----------+--------------------+ Ulnar                                               patent by color flow +----------+------------+---------+-----------+----------+--------------------+ Cephalic      Full                                                       +----------+------------+---------+-----------+----------+--------------------+ Basilic       Full                                                       +----------+------------+---------+-----------+----------+--------------------+ Disturbed tissue noted wrist to forearm  Summary:  Right: No evidence of superficial vein thrombosis in the upper extremity. Right brachial vein is compressible, but no Doppler or color flow noted.  Left: No evidence of deep vein thrombosis in the upper extremity. No evidence of superficial vein thrombosis in the upper extremity.  *See table(s) above for measurements and observations.     Preliminary     Procedures Procedures (including critical care time)  Medications Ordered in ED Medications  enoxaparin (LOVENOX) injection 120 mg (has no administration in time range)     Initial Impression / Assessment and Plan / ED Course  I have reviewed the triage vital signs and  the nursing notes.  Pertinent labs & imaging results that were available during my care of the patient were reviewed by me and considered in my medical decision making (see chart for details).  Clinical Course as  of Oct 21 10  Mon Oct 20, 2018  2238 Dr. Darrick Penna vascular surgery   [BM]    Clinical Course User Index [BM] Bill Salinas, PA-C   CBC nonacute CMP with creatinine 1.58 appears baseline Bilateral UE [place]:  Summary:    Right:  No evidence of superficial vein thrombosis in the upper extremity. Right  brachial vein is compressible, but no Doppler or color flow noted.    Left:  No evidence of deep vein thrombosis in the upper extremity. No evidence of  superficial vein thrombosis in the upper extremity.  - I discussed ultrasound findings with on-call vascular surgeon Dr. Darrick Penna, advises that this is an abnormal finding however is not concerning for DVT or other acute processes, no further work-up indicated time. - EKG: Sinus rhythm Multiform ventricular premature complexes Consider anterior infarct When compared with ECG of 03/24/2018, QT has shortened Premature ventricular complexes are now present Confirmed by Dione Booze ([id number]) on 10/20/2018 11:31:10 PM - Patient was reassessed and updated on findings, he is now reporting that he is concerned that his right leg appears somewhat larger than his left leg and is concerned for a DVT.  On reexamination his bilateral lower extremities appear symmetrical there is no color change, tenderness or edema.  NVI bilateral extremities.  Unfortunately ultrasound is not available in our facility at this time.  Case discussed with Dr. Preston Fleeting, advises patient would need aspirin and warm compresses for his bilateral upper extremity thrombophlebitis.  As to patient's concern for right leg swelling he will need to return tomorrow for his ultrasound, patient will need anticoagulation until that time.  No further work-up indicated at this time. - Consult to pharmacy placed for Lovenox injection until patient can return tomorrow morning for his ultrasound.  I discussed precautions regarding blood thinners with the patient and including increased risks of  bleeding and he stated understanding, he reports he is on blood thinners for multiple years and already is aware of these precautions.  I advised him that if he was to suffer any injury or trauma that he needs to return to the emergency department immediately and he stated understanding.  Patient is agreeable to care plan above, Lovenox injection and return tomorrow for ultrasound of his bilateral lower extremities, if negative he will continue warm compresses of his upper extremities and begin once daily baby aspirin.  At this time there does not appear to be any evidence of an acute emergency medical condition and the patient appears stable for discharge with appropriate outpatient follow up. Diagnosis was discussed with patient who verbalizes understanding of care plan and is agreeable to discharge. I have discussed return precautions with patient who verbalizes understanding of return precautions. Patient encouraged to follow-up with their PCP. All questions answered.  Patient's case discussed with Dr. Preston Fleeting who agrees with plan to discharge with follow-up.   Note: Portions of this report may have been transcribed using voice recognition software. Every effort was made to ensure accuracy; however, inadvertent computerized transcription errors may still be present. Final Clinical Impressions(s) / ED Diagnoses   Final diagnoses:  Thrombophlebitis  Leg swelling  Elevated blood pressure reading    ED Discharge Orders         Ordered    LE VENOUS  10/20/18 2348           Bill Salinas, PA-C 10/21/18 0032    Dione Booze, MD 10/21/18 [phone]

## 2018-10-20 NOTE — ED Triage Notes (Signed)
Pt endorses having a knot on both forearms. Redness and swelling noted to both area. Strong radial pulses. Pt concerned about blood clots due to hx of PE. Not on blood thinners.

## 2018-10-21 ENCOUNTER — Emergency Department (HOSPITAL_COMMUNITY)
Admission: EM | Admit: 2018-10-21 | Discharge: 2018-10-21 | Disposition: A | Discharge status: Home / Self Care | Payer: Medicaid Other | Source: Home / Self Care | Attending: Emergency Medicine | Admitting: Emergency Medicine

## 2018-10-21 ENCOUNTER — Ambulatory Visit (HOSPITAL_COMMUNITY)
Admission: RE | Admit: 2018-10-21 | Discharge: 2018-10-21 | Disposition: A | Discharge status: Home / Self Care | Payer: Medicaid Other | Source: Ambulatory Visit | Attending: Medical | Admitting: Medical

## 2018-10-21 ENCOUNTER — Encounter (HOSPITAL_COMMUNITY): Payer: Self-pay | Admitting: Emergency Medicine

## 2018-10-21 ENCOUNTER — Other Ambulatory Visit: Payer: Self-pay

## 2018-10-21 ENCOUNTER — Emergency Department (HOSPITAL_COMMUNITY): Payer: Medicaid Other

## 2018-10-21 DIAGNOSIS — I824Y1 Acute embolism and thrombosis of unspecified deep veins of right proximal lower extremity: Secondary | ICD-10-CM

## 2018-10-21 DIAGNOSIS — Z86711 Personal history of pulmonary embolism: Secondary | ICD-10-CM | POA: Insufficient documentation

## 2018-10-21 DIAGNOSIS — M7989 Other specified soft tissue disorders: Secondary | ICD-10-CM

## 2018-10-21 DIAGNOSIS — Z79899 Other long term (current) drug therapy: Secondary | ICD-10-CM | POA: Insufficient documentation

## 2018-10-21 DIAGNOSIS — R0789 Other chest pain: Secondary | ICD-10-CM | POA: Insufficient documentation

## 2018-10-21 LAB — CBC WITH DIFFERENTIAL/PLATELET
Abs Immature Granulocytes: 0.03 10*3/uL (ref 0.00–0.07)
Basophils Absolute: 0.1 10*3/uL (ref 0.0–0.1)
Basophils Relative: 1 %
Eosinophils Absolute: 0.2 10*3/uL (ref 0.0–0.5)
Eosinophils Relative: 2 %
HCT: 42.7 % (ref 39.0–52.0)
Hemoglobin: 13.6 g/dL (ref 13.0–17.0)
Immature Granulocytes: 0 %
Lymphocytes Relative: 35 %
Lymphs Abs: 2.8 10*3/uL (ref 0.7–4.0)
MCH: 27.9 pg (ref 26.0–34.0)
MCHC: 31.9 g/dL (ref 30.0–36.0)
MCV: 87.5 fL (ref 80.0–100.0)
Monocytes Absolute: 0.6 10*3/uL (ref 0.1–1.0)
Monocytes Relative: 8 %
Neutro Abs: 4.2 10*3/uL (ref 1.7–7.7)
Neutrophils Relative %: 54 %
Platelets: 262 10*3/uL (ref 150–400)
RBC: 4.88 MIL/uL (ref 4.22–5.81)
RDW: 14.1 % (ref 11.5–15.5)
WBC: 7.9 10*3/uL (ref 4.0–10.5)
nRBC: 0 % (ref 0.0–0.2)

## 2018-10-21 LAB — BASIC METABOLIC PANEL
Anion gap: 12 (ref 5–15)
BUN: 6 mg/dL (ref 6–20)
CO2: 24 mmol/L (ref 22–32)
Calcium: 9.1 mg/dL (ref 8.9–10.3)
Chloride: 106 mmol/L (ref 98–111)
Creatinine, Ser: 1.28 mg/dL — ABNORMAL HIGH (ref 0.61–1.24)
GFR calc Af Amer: >60 mL/min (ref >60)
GFR calc non Af Amer: >60 mL/min (ref >60)
Glucose, Bld: 87 mg/dL (ref 70–99)
Potassium: 3.7 mmol/L (ref 3.5–5.1)
Sodium: 142 mmol/L (ref 135–145)

## 2018-10-21 MED ORDER — ENOXAPARIN SODIUM 120 MG/0.8ML ~~LOC~~ SOLN
120.0000 mg | Freq: Once | SUBCUTANEOUS | Status: AC
  Administered 2018-10-21: 120 mg via SUBCUTANEOUS
  Filled 2018-10-21: qty 0.8

## 2018-10-21 MED ORDER — OXYCODONE-ACETAMINOPHEN 5-325 MG PO TABS
1.0000 | ORAL_TABLET | Freq: Once | ORAL | Status: AC
  Administered 2018-10-21: 1 via ORAL
  Filled 2018-10-21: qty 1

## 2018-10-21 MED ORDER — APIXABAN 5 MG PO TABS: 5.0000 mg | ORAL_TABLET | Freq: Two times a day (BID) | ORAL | 0 refills | Status: DC

## 2018-10-21 MED ORDER — IOHEXOL 350 MG/ML SOLN
100.0000 mL | Freq: Once | INTRAVENOUS | Status: AC | PRN
  Administered 2018-10-21: 100 mL via INTRAVENOUS

## 2018-10-21 NOTE — ED Provider Notes (Signed)
Care assumed from C. Lawyer PA-C.  Please see his full H&P.  In short,  Justin [name] is a 53 y.o. male presents for right leg pain.  He was found to have DVT on ultrasound today involving the right peroneal veins.. Pt reports he has a history of PE he thinks back in 2014. He was on coumadin and unfortunately had a brain bleed.  Work-up by previous provider includes unremarkable BMP and CBC.  CTA chest pending.  Will consult pharmacy for recommendations for DVT and possible PE treatment if positive.  Physical Exam  BP 127/87   Pulse 76   Temp 98.2 F (36.8 C) (Oral)   Resp 17   Ht 5\' 8"  (1.727 m)   Wt 109 kg   SpO2 100%   BMI 36.54 kg/m   Physical Exam PE: Constitutional: well-developed, well-nourished, no apparent distress HENT: normocephalic, atraumatic. no cervical adenopathy Cardiovascular: normal rate and rhythm, distal pulses intact Pulmonary/Chest: effort normal; breath sounds clear and equal bilaterally; no wheezes or rales. SpO2 is 100% on room air. Abdominal: soft and nontender Musculoskeletal: full ROM.  Mild swelling and edema to right lower extremity. Neurological: alert with goal directed thinking Skin: warm and dry, no rash, no diaphoresis Psychiatric: normal mood and affect, normal behavior    ED Course/Procedures      Procedures  Results for orders placed or performed during the hospital encounter of 10/20/18 (from the past 24 hour(s))  Comprehensive metabolic panel     Status: Abnormal   Collection Time: 10/20/18  5:23 PM  Result Value Ref Range   Sodium 143 135 - 145 mmol/L   Potassium 3.3 (L) 3.5 - 5.1 mmol/L   Chloride 111 98 - 111 mmol/L   CO2 21 (L) 22 - 32 mmol/L   Glucose, Bld 134 (H) 70 - 99 mg/dL   BUN 6 6 - 20 mg/dL   Creatinine, Ser 1.58 (H) 0.61 - 1.24 mg/dL   Calcium 9.1 8.9 - 10.3 mg/dL   Total Protein 7.4 6.5 - 8.1 g/dL   Albumin 4.4 3.5 - 5.0 g/dL   AST 33 15 - 41 U/L   ALT 29 0 - 44 U/L   Alkaline Phosphatase 83 38 - 126 U/L    Total Bilirubin 0.6 0.3 - 1.2 mg/dL   GFR calc non Af Amer 49 (L) >60 mL/min   GFR calc Af Amer 56 (L) >60 mL/min   Anion gap 11 5 - 15  CBC with Differential     Status: Abnormal   Collection Time: 10/20/18  5:23 PM  Result Value Ref Range   WBC 8.9 4.0 - 10.5 K/uL   RBC 4.55 4.22 - 5.81 MIL/uL   Hemoglobin 12.8 (L) 13.0 - 17.0 g/dL   HCT 39.1 39.0 - 52.0 %   MCV 85.9 80.0 - 100.0 fL   MCH 28.1 26.0 - 34.0 pg   MCHC 32.7 30.0 - 36.0 g/dL   RDW 13.8 11.5 - 15.5 %   Platelets 254 150 - 400 K/uL   nRBC 0.0 0.0 - 0.2 %   Neutrophils Relative % 65 %   Neutro Abs 5.8 1.7 - 7.7 K/uL   Lymphocytes Relative 27 %   Lymphs Abs 2.4 0.7 - 4.0 K/uL   Monocytes Relative 5 %   Monocytes Absolute 0.5 0.1 - 1.0 K/uL   Eosinophils Relative 2 %   Eosinophils Absolute 0.1 0.0 - 0.5 K/uL   Basophils Relative 1 %   Basophils Absolute 0.1 0.0 -  0.1 K/uL   Immature Granulocytes 0 %   Abs Immature Granulocytes 0.03 0.00 - 0.07 K/uL   CT ANGIOGRAPHY CHEST WITH CONTRAST  TECHNIQUE: Multidetector CT imaging of the chest was performed using the standard protocol during bolus administration of intravenous contrast. Multiplanar CT image reconstructions and MIPs were obtained to evaluate the vascular anatomy.  CONTRAST: OMNIPAQUE IOHEXOL 350 MG/ML SOLN IV  COMPARISON: By 07/2017  FINDINGS: Cardiovascular: Mild atherosclerotic calcifications within the aorta and coronary arteries. Aorta normal caliber. Mild enlargement of cardiac chambers. No pericardial effusion. Pulmonary arteries well opacified and patent. No evidence of pulmonary embolism. Mediastinum/Nodes: Esophagus normal appearance. Base of cervical region normal appearance. Few normal sized RIGHT paratracheal lymph nodes without thoracic adenopathy. Lungs/Pleura: Dependent atelectasis in BILATERAL lower lobes. Minimal hazy infiltrates in the upper lobes and centrally in lower lobes. No pleural effusion or pneumothorax. Upper Abdomen:  Visualized upper abdomen unremarkable. Musculoskeletal: Normal appearance. Review of the MIP images confirms the above findings. IMPRESSION: No evidence of pulmonary embolism. Dependent atelectasis in the lower lobes with minimal hazy infiltrates centrally in the upper and lower lobes, could represent minimal pulmonary edema or infection. Aortic Atherosclerosis (ICD10-I70.0). Electronically SignedBy: Ulyses Southward M.D. On: 10/21/2018 16:24     MDM    53 year old male presents with right leg pain.  Well-appearing, no acute distress.  Lungs are clear to auscultation in all fields, SpO2 is 100% on room air during exam. He was found to have DVT on ultrasound in right lower extremity.  CTA of chest is negative for PE. Radiologist reports dependent atelectasis in lower lobes. Given pt is afebrile, lungs sound clear, no leukocytosis doubt acute infectious process.  Patient will be given first dose of Eliquis here. Discussed risks of anticoagulats including high risk of bleeding, avoiding NSAID use, to seek treatment immediately if falls or trauma. Pt reports understanding.  Patient is comfortable with above plan and is stable for discharge at this time. All questions were answered prior to disposition.  Strict return precautions for returning to the ED were discussed. Encouraged follow up with PCP given new DVT diagnosis and starting on Eliquis. Findings and plan of care discussed with supervising physician Dr. Particia Nearing.   This note was prepared using Dragon voice recognition software and may include unintentional dictation errors due to the inherent limitations of voice recognition software.         Kathyrn Lass 10/21/18 Nicola Girt, MD 10/21/18 [phone]

## 2018-10-21 NOTE — ED Notes (Signed)
Spoke with EDP no orders at this time.

## 2018-10-21 NOTE — ED Notes (Signed)
Patient verbalized understanding of dc instructions, vss, ambulatory with nad.   

## 2018-10-21 NOTE — ED Provider Notes (Signed)
[name]   CSN: [id number] Arrival date & time: 10/21/18  1139     History   Chief Complaint Chief Complaint  Patient presents with   Leg Problem    HPI Trellis MomentRobert C [name] is a 53 y.o. male.     HPI Patient presents to the emergency department with DVT this found on ultrasound today.  The patient was here last night and they ordered an ultrasound of his right lower extremity.  The patient was having swelling in that extremity.  Patient states that he is also had some chest discomfort.  The patient was in the emergency department for other issues yesterday.  The patient denies  shortness of breath, headache,blurred vision, neck pain, fever, cough, weakness, numbness, dizziness, anorexia, edema, abdominal pain, nausea, vomiting, diarrhea, rash, back pain, dysuria, hematemesis, bloody stool, near syncope, or syncope.  Patient states that they did ultrasounds of his upper extremities yesterday with no findings. Past Medical History:  Diagnosis Date   Achilles rupture, left    Bipolar 1 disorder (HCC)    Dental caries    periodontitis   Pneumonia    Pulmonary embolism (HCC)    Sleep apnea    wears CPAP   Subdural hematoma (HCC) 2015    Patient Active Problem List   Diagnosis Date Noted   Status post lumbar spinal fusion 01/03/2018   Lumbar stenosis 12/23/2017   Spondylolisthesis, lumbar region 06/25/2017   Achilles tendinitis of left lower extremity 12/07/2016   Achilles tendinitis, left leg 05/17/2016   Obstructive sleep apnea on CPAP 03/07/2015   Post-operative state 03/07/2015   Achilles rupture, left 10/21/2014   OSA (obstructive sleep apnea) 04/23/2013   Bipolar disorder, unspecified (HCC) 01/07/2013   Sinus tachycardia 01/07/2013   Headache(784.0) 01/07/2013   SDH (subdural hematoma) (HCC) 01/04/2013   H/O PE 01/04/2013   Chronic anticoagulation 01/04/2013   Warfarin-induced coagulopathy  (HCC) 01/04/2013   Acute sinusitis 01/04/2013    Past Surgical History:  Procedure Laterality Date   ACHILLES TENDON SURGERY Left 10/21/2014   Procedure: Left Achilles Reconstruction;  Surgeon: Nadara MustardMarcus Duda V, MD;  Location: [facility] OR;  Service: Orthopedics;  Laterality: Left;   APPENDECTOMY     CRANIOTOMY N/A 01/19/2013   Procedure: CRANIOTOMY HEMATOMA EVACUATION SUBDURAL;  Surgeon: Hewitt Shortsobert W Nudelman, MD;  Location: [facility];  Service: Neurosurgery;  Laterality: N/A;   CYSTECTOMY     right head   ELBOW SURGERY     right   FRACTURE SURGERY     finger   I&D EXTREMITY Left 12/07/2016   Procedure: LEFT ACHILLES DEBRIDEMENT;  Surgeon: Nadara Mustarduda, Marcus V, MD;  [facility];  Service: Orthopedics;  Laterality: Left;   LUMBAR FUSION  12/23/2017   L5 GILL PROCEDURE, RIGHT L5-S1, TRANSFORAMIAL LUMBAR INTERBODY FUSION, BILATERAL LATERAL FUSION, PEDICLE INSTRUMENTATION   MULTIPLE EXTRACTIONS WITH ALVEOLOPLASTY N/A 03/07/2015   Procedure: MULTIPLE EXTRACTION WITH ALVEOLOPLASTY;  Surgeon: [name], DDS;  Location: [facility] OR;  Service: Oral Surgery;  Laterality: N/A;        Home Medications    Prior to Admission medications   Medication Sig Start Date End Date Taking? Authorizing Provider  doxepin (SINEQUAN) 10 MG capsule Take 20 mg by mouth at bedtime.  07/15/17   [provider]  HYDROcodone-acetaminophen (NORCO/VICODIN) 5-325 MG tablet  07/30/18   [provider]  lidocaine (LIDODERM) 5 % Place 1 patch onto the skin daily. Remove & Discard patch within 12 hours or as directed  by MD Patient not taking: Reported on 09/02/2018 08/09/18   Nuala Alpha A, PA-C  lithium 600 MG capsule Take 600 mg by mouth at bedtime.    [provider]  methocarbamol (ROBAXIN) 500 MG tablet Take 1 tablet (500 mg total) by mouth 2 (two) times daily. 08/09/18   Nuala Alpha A, PA-C  mirtazapine (REMERON) 45 MG tablet Take 45 mg by mouth at bedtime.    [provider]    naproxen (NAPROSYN) 500 MG tablet Take 1 tablet (500 mg total) by mouth 2 (two) times daily with a meal. Patient not taking: Reported on 07/24/2018 06/26/18   Caccavale, Sophia, PA-C  QUEtiapine (SEROQUEL XR) 400 MG 24 hr tablet Take 800 mg by mouth at bedtime.    [provider]    Family History Family History  Problem Relation Age of Onset   Hypertension Mother    Stroke Mother    Multiple sclerosis Sister    Down syndrome Son     Social History Social History   Tobacco Use   Smoking status: Never Smoker   Smokeless tobacco: Never Used  Substance Use Topics   Alcohol use: No   Drug use: No     Allergies   Patient has no known allergies.   Review of Systems Review of Systems All other systems negative except as documented in the HPI. All pertinent positives and negatives as reviewed in the HPI.  Physical Exam Updated Vital Signs BP 127/87    Pulse 76    Temp 98.2 F (36.8 C) (Oral)    Resp 17    Ht 5\' 8"  (1.727 m)    Wt 109 kg    SpO2 100%    BMI 36.54 kg/m   Physical Exam Vitals signs and nursing note reviewed.  Constitutional:      General: He is not in acute distress.    Appearance: He is well-developed.  HENT:     Head: Normocephalic and atraumatic.  Eyes:     Pupils: Pupils are equal, round, and reactive to light.  Neck:     Musculoskeletal: Normal range of motion and neck supple.  Cardiovascular:     Rate and Rhythm: Normal rate and regular rhythm.     Heart sounds: Normal heart sounds. No murmur. No friction rub. No gallop.   Pulmonary:     Effort: Pulmonary effort is normal. No respiratory distress.     Breath sounds: Normal breath sounds. No wheezing.  Abdominal:     General: Bowel sounds are normal. There is no distension.     Palpations: Abdomen is soft.     Tenderness: There is no abdominal tenderness.  Musculoskeletal:        General: Swelling present.     Right lower leg: Edema present.  Skin:    General: Skin is warm and  dry.     Capillary Refill: Capillary refill takes less than 2 seconds.     Findings: No erythema or rash.  Neurological:     Mental Status: He is alert and oriented to person, place, and time.     Motor: No abnormal muscle tone.     Coordination: Coordination normal.  Psychiatric:        Behavior: Behavior normal.      ED Treatments / Results  Labs (all labs ordered are listed, but only abnormal results are displayed) Labs Reviewed - No data to display  EKG None  Radiology Le Venous  Result Date: 10/21/2018  Lower  Venous Study Indications: Swelling, and Pain. Other Indications: History of DVT 6 years ago. Comparison Study: Last LEV study done on 12/25/17 - negative for DVT. Performing Technologist: Marilynne Halsted RDMS, RVT  Examination Guidelines: A complete evaluation includes B-mode imaging, spectral Doppler, color Doppler, and power Doppler as needed of all accessible portions of each vessel. Bilateral testing is considered an integral part of a complete examination. Limited examinations for reoccurring indications may be performed as noted.  +---------+---------------+---------+-----------+----------+-------+  RIGHT     Compressibility Phasicity Spontaneity Properties Summary  +---------+---------------+---------+-----------+----------+-------+  CFV       Full            Yes       Yes                             +---------+---------------+---------+-----------+----------+-------+  SFJ       Full                                                      +---------+---------------+---------+-----------+----------+-------+  FV Prox   Full                                                      +---------+---------------+---------+-----------+----------+-------+  FV Mid    Full                                                      +---------+---------------+---------+-----------+----------+-------+  FV Distal Full                                                       +---------+---------------+---------+-----------+----------+-------+  PFV       Full                                                      +---------+---------------+---------+-----------+----------+-------+  POP       Full            Yes       Yes                             +---------+---------------+---------+-----------+----------+-------+  PTV       Full                                                      +---------+---------------+---------+-----------+----------+-------+  PERO      None  Acute    +---------+---------------+---------+-----------+----------+-------+ Acute DVT in one of the paired peroneal veins in the proximal and mid segments.  +----+---------------+---------+-----------+----------+-------+  LEFT Compressibility Phasicity Spontaneity Properties Summary  +----+---------------+---------+-----------+----------+-------+  CFV  Full            Yes       Yes                             +----+---------------+---------+-----------+----------+-------+  SFJ  Full                                                      +----+---------------+---------+-----------+----------+-------+    Summary: Right: Findings consistent with acute deep vein thrombosis involving the right peroneal veins. Left: No evidence of common femoral vein obstruction.  *See table(s) above for measurements and observations. Electronically signed by Waverly Ferrari MD on 10/21/2018 at 1:29:31 PM.    Final    Ue Venous Duplex ([facility] Only)  Result Date: 10/21/2018 UPPER VENOUS STUDY  Indications: erythema low to mid forearm, bilaterally. Risk Factors: History of PE. Comparison Study: No prior study on file for comparison. Performing Technologist: Sherren Kerns RVS  Examination Guidelines: A complete evaluation includes B-mode imaging, spectral Doppler, color Doppler, and power Doppler as needed of all accessible portions of each vessel. Bilateral testing is considered an integral part of  a complete examination. Limited examinations for reoccurring indications may be performed as noted.  Right Findings: +----------+------------+---------+-----------+----------+---------------------+  RIGHT      Compressible Phasicity Spontaneous Properties        Summary         +----------+------------+---------+-----------+----------+---------------------+  IJV            Full        Yes        Yes                                       +----------+------------+---------+-----------+----------+---------------------+  Subclavian                 Yes        Yes                                       +----------+------------+---------+-----------+----------+---------------------+  Axillary       Full        Yes        Yes                                       +----------+------------+---------+-----------+----------+---------------------+  Brachial       Full        No         No                 sluggish flow/Unable  to Doppler color or                                                                   waveform         +----------+------------+---------+-----------+----------+---------------------+  Radial         Full                                                             +----------+------------+---------+-----------+----------+---------------------+  Ulnar                                                       patent by color                                                                      Doppler         +----------+------------+---------+-----------+----------+---------------------+  Cephalic       Full                                                             +----------+------------+---------+-----------+----------+---------------------+  Basilic        Full                                                             +----------+------------+---------+-----------+----------+---------------------+ Brachial vein is compressible. However, sluggish  flow is noted and color flow and Doppler flow is not noted. Disturbed tissue noted mid forearm.  Left Findings: +----------+------------+---------+-----------+----------+--------------------+  LEFT       Compressible Phasicity Spontaneous Properties       Summary         +----------+------------+---------+-----------+----------+--------------------+  IJV            Full        Yes        Yes                                      +----------+------------+---------+-----------+----------+--------------------+  Subclavian                 Yes        Yes                                      +----------+------------+---------+-----------+----------+--------------------+  Axillary       Full        Yes        Yes                                      +----------+------------+---------+-----------+----------+--------------------+  Brachial       Full        Yes        Yes                  Poor color fill     +----------+------------+---------+-----------+----------+--------------------+  Radial         Full                                                            +----------+------------+---------+-----------+----------+--------------------+  Ulnar                                                    patent by color flow  +----------+------------+---------+-----------+----------+--------------------+  Cephalic       Full                                                            +----------+------------+---------+-----------+----------+--------------------+  Basilic        Full                                                            +----------+------------+---------+-----------+----------+--------------------+ Disturbed tissue noted wrist to forearm  Summary:  Right: No evidence of superficial vein thrombosis in the upper extremity. Right brachial vein is compressible, but no Doppler or color flow noted.  Left: No evidence of deep vein thrombosis in the upper extremity. No evidence of superficial vein thrombosis in the  upper extremity.  *See table(s) above for measurements and observations.  Diagnosing physician: Waverly Ferrarihristopher Dickson MD Electronically signed by Waverly Ferrarihristopher Dickson MD on 10/21/2018 at 1:33:36 PM.    Final     Procedures Procedures (including critical care time)  Medications Ordered in ED Medications - No data to display   Initial Impression / Assessment and Plan / ED Course  I have reviewed the triage vital signs and the nursing notes.  Pertinent labs & imaging results that were available during my care of the patient were reviewed by me and considered in my medical decision making (see chart for details).        Patient will need PE study due to the fact that he is had a PE.  Patient is otherwise stable. Final Clinical Impressions(s) / ED Diagnoses   Final diagnoses:  None    ED Discharge Orders    None       Charlestine NightLawyer, Isaac Lacson, PA-C 10/21/18 1507  Tegeler, Canary Brim, MD 10/21/18 [phone]

## 2018-10-21 NOTE — Progress Notes (Signed)
Right lower ext venous duplex  has been completed. Refer to [facility] under chart review to view preliminary results. Patient was escorted to the ED.  10/21/2018  11:27 AM Jozlyn Schatz, Bonnye Fava

## 2018-10-21 NOTE — Discharge Instructions (Signed)
You have been seen today for blood clot in your right leg. Please read and follow all provided instructions. Return to the emergency room for worsening condition or new concerning symptoms.     1. Medications:  Prescription sent to your pharmacy for eliquis. This is a blood thinner for your the blood clot in your leg. Please take it as prescribed. Do not  take ibuprofen or NSAIDs while taking this medicine. -It is important that you do not miss any doses of this medicine.  - If you have any falls or injuries you are a high risk of internal bleeding. Please come immediately to emergency department. Continue usual home medications Take medications as prescribed. Please review all of the medicines and only take them if you do not have an allergy to them.   2. Treatment: rest, drink plenty of fluids  3. Follow Up: Please follow up with your primary doctor in 2-5 days for discussion of your diagnoses and further evaluation after today's visit for your DVT.  It is also a possibility that you have an allergic reaction to any of the medicines that you have been prescribed - Everybody reacts differently to medications and while MOST people have no trouble with most medicines, you may have a reaction such as nausea, vomiting, rash, swelling, shortness of breath. If this is the case, please stop taking the medicine immediately and contact your physician.  ?

## 2018-10-21 NOTE — Discharge Instructions (Addendum)
You have been diagnosed today with thrombophlebitis of the arms and leg swelling.  At this time there does not appear to be the presence of an emergent medical condition, however there is always the potential for conditions to change. Please read and follow the below instructions.  Please return to the Emergency Department immediately for any new or worsening symptoms. Please be sure to follow up with your Primary Care Provider within one week regarding your visit today; please call their office to schedule an appointment even if you are feeling better for a follow-up visit. You must return tomorrow morning to have ultrasound of your lower extremities performed to evaluate for deep vein thrombosis.  You have been given an injection of a blood thinner called Lovenox today.  If you are to have any injury you must return immediately to the emergency department as your risk of bleeding is greatly increased while taking this blood thinner. As we discussed you may begin using warm compresses on the bumps on your arms to help with the inflammation.  If your ultrasounds tomorrow morning are negative for DVT and you do not continue on your blood thinner then you may begin taking a once daily baby aspirin as we discussed starting the next day. Additionally your blood pressure was elevated today.  Please call your primary care provider to schedule a blood pressure recheck and medication management this week.  Get help right away if: You have any of these problems: New or worse pain, swelling, or redness in an arm or leg. Numbness or tingling in an arm or leg. Shortness of breath. Chest pain. Severe pain in your abdomen. Fast breathing. A fast or irregular heartbeat. Blood in your vomit, stool, or urine. A severe headache or confusion. A cut that does not stop bleeding. You feel light-headed or dizzy. You cough up blood. You have a serious fall or accident, or you hit your head. Get a very bad  headache. Start to feel mixed up (confused). Feel weak or numb. Feel faint. Have very bad pain in your: Chest. Belly (abdomen). Throw up more than once. Have trouble breathing. Any new/concerning or worsening symptoms  Please read the additional information packets attached to your discharge summary.  Do not take your medicine if  develop an itchy rash, swelling in your mouth or lips, or difficulty breathing; call 911 and seek immediate emergency medical attention if this occurs.

## 2018-10-21 NOTE — ED Triage Notes (Signed)
Onset right leg pain and swelling "for a while" seen in the ED yesterday set up an appointment for and DVT study today. Sent to the ED because test was positive. History of PE.

## 2018-10-28 NOTE — Progress Notes (Signed)
Reviewed and agree with assessment/plan.   Mike Hamre, MD [facility] 03/14/2016, 12:24 PM Pager:  [phone]  

## 2018-11-01 ENCOUNTER — Other Ambulatory Visit: Payer: Medicaid Other

## 2018-11-05 ENCOUNTER — Ambulatory Visit: Payer: Medicaid Other | Admitting: Orthopaedic Surgery

## 2018-11-15 ENCOUNTER — Emergency Department (HOSPITAL_BASED_OUTPATIENT_CLINIC_OR_DEPARTMENT_OTHER): Payer: Medicaid Other

## 2018-11-15 ENCOUNTER — Other Ambulatory Visit: Payer: Self-pay

## 2018-11-15 ENCOUNTER — Encounter (HOSPITAL_COMMUNITY): Payer: Self-pay

## 2018-11-15 ENCOUNTER — Emergency Department (HOSPITAL_COMMUNITY)
Admission: EM | Admit: 2018-11-15 | Discharge: 2018-11-15 | Disposition: A | Discharge status: Home / Self Care | Payer: Medicaid Other | Attending: Emergency Medicine | Admitting: Emergency Medicine

## 2018-11-15 DIAGNOSIS — Z79899 Other long term (current) drug therapy: Secondary | ICD-10-CM | POA: Insufficient documentation

## 2018-11-15 DIAGNOSIS — M79609 Pain in unspecified limb: Secondary | ICD-10-CM

## 2018-11-15 DIAGNOSIS — M79604 Pain in right leg: Secondary | ICD-10-CM | POA: Diagnosis present

## 2018-11-15 DIAGNOSIS — I82451 Acute embolism and thrombosis of right peroneal vein: Secondary | ICD-10-CM | POA: Diagnosis not present

## 2018-11-15 DIAGNOSIS — M7989 Other specified soft tissue disorders: Secondary | ICD-10-CM

## 2018-11-15 MED ORDER — APIXABAN 5 MG PO TABS: 5.0000 mg | ORAL_TABLET | Freq: Two times a day (BID) | ORAL | 0 refills | Status: DC

## 2018-11-15 MED ORDER — HYDROCODONE-ACETAMINOPHEN 5-325 MG PO TABS
1.0000 | ORAL_TABLET | Freq: Once | ORAL | Status: AC
  Administered 2018-11-15: 1 via ORAL
  Filled 2018-11-15: qty 1

## 2018-11-15 NOTE — ED Triage Notes (Signed)
Pt c/o right leg pain from knee down. Pt states known DVT with hx of PE. Denies chest pain or SHOB.

## 2018-11-15 NOTE — ED Provider Notes (Signed)
[facility] EMERGENCY DEPARTMENT Provider Note   CSN: [id number] Arrival date & time: 11/15/18  1014     History   Chief Complaint Chief Complaint  Patient presents with   Leg Pain    HPI Justin [name] is a 53 y.o. male with PMHx bipolar disorder, sleep apnea on CPAP, Hx of PE 10 years ago, recent diagnosis of right DVT who presents the ED today complaining of unchanged right lower extremity pain.  Seen in the ED on 8/04 for right leg pain was diagnosed with a DVT right peroneal veins.  She was discharged home with Eliquis and told to follow-up with his PCP.  He states that he has not missed a single dose of his Eliquis.  He continues to have pain and this has been concerning him.  Patient is very concerned that the blood clot could be moving and he does not want to go into his chest.  States he was seen by his PCP last week and they gave him muscle relaxers which have not been helping.  Not been taking anything else for the pain.  Denies chest pain, shortness of breath, worsening swelling, redness to the leg, fever, chills, any other associated symptoms.  No new injury to the leg.        Past Medical History:  Diagnosis Date   Achilles rupture, left    Bipolar 1 disorder (HCC)    Dental caries    periodontitis   Pneumonia    Pulmonary embolism (HCC)    Sleep apnea    wears CPAP   Subdural hematoma (HCC) 2015    Patient Active Problem List   Diagnosis Date Noted   Status post lumbar spinal fusion 01/03/2018   Lumbar stenosis 12/23/2017   Spondylolisthesis, lumbar region 06/25/2017   Achilles tendinitis of left lower extremity 12/07/2016   Achilles tendinitis, left leg 05/17/2016   Obstructive sleep apnea on CPAP 03/07/2015   Post-operative state 03/07/2015   Achilles rupture, left 10/21/2014   OSA (obstructive sleep apnea) 04/23/2013   Bipolar disorder, unspecified (HCC) 01/07/2013   Sinus tachycardia 01/07/2013   Headache(784.0)  01/07/2013   SDH (subdural hematoma) (HCC) 01/04/2013   H/O PE 01/04/2013   Chronic anticoagulation 01/04/2013   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   Acute sinusitis 01/04/2013    Past Surgical History:  Procedure Laterality Date   ACHILLES TENDON SURGERY Left 10/21/2014   Procedure: Left Achilles Reconstruction;  Surgeon: Nadara Mustard, MD;  Location: [facility] OR;  Service: Orthopedics;  Laterality: Left;   APPENDECTOMY     CRANIOTOMY N/A 01/19/2013   Procedure: CRANIOTOMY HEMATOMA EVACUATION SUBDURAL;  Surgeon: Hewitt Shorts, MD;  Location: [facility];  Service: Neurosurgery;  Laterality: N/A;   CYSTECTOMY     right head   ELBOW SURGERY     right   FRACTURE SURGERY     finger   I&D EXTREMITY Left 12/07/2016   Procedure: LEFT ACHILLES DEBRIDEMENT;  Surgeon: Nadara Mustard, MD;  Location: [facility] OR;  Service: Orthopedics;  Laterality: Left;   LUMBAR FUSION  12/23/2017   L5 GILL PROCEDURE, RIGHT L5-S1, TRANSFORAMIAL LUMBAR INTERBODY FUSION, BILATERAL LATERAL FUSION, PEDICLE INSTRUMENTATION   MULTIPLE EXTRACTIONS WITH ALVEOLOPLASTY N/A 03/07/2015   Procedure: MULTIPLE EXTRACTION WITH ALVEOLOPLASTY;  Surgeon: [name], DDS;  Location: [facility] OR;  Service: Oral Surgery;  Laterality: N/A;        Home Medications    Prior to Admission medications   Medication Sig Start Date End Date  Taking? Authorizing Provider  doxepin (SINEQUAN) 10 MG capsule Take 20 mg by mouth at bedtime.  07/15/17  Yes [provider]  hydrOXYzine (ATARAX/VISTARIL) 25 MG tablet Take 25 mg by mouth as needed for anxiety.  09/06/18  Yes [provider]  lithium 600 MG capsule Take 600 mg by mouth at bedtime.   Yes [provider]  methocarbamol (ROBAXIN) 500 MG tablet Take 1 tablet (500 mg total) by mouth 2 (two) times daily. Patient taking differently: Take 500 mg by mouth every 8 (eight) hours as needed for muscle spasms.  08/09/18  Yes Nuala Alpha A, PA-C    mirtazapine (REMERON) 45 MG tablet Take 45 mg by mouth at bedtime.   Yes [provider]  QUEtiapine (SEROQUEL XR) 400 MG 24 hr tablet Take 800 mg by mouth at bedtime.   Yes [provider]  apixaban (ELIQUIS) 5 MG TABS tablet Take 1 tablet (5 mg total) by mouth 2 (two) times daily. 11/15/18   Kayann Maj, PA-C  lidocaine (LIDODERM) 5 % Place 1 patch onto the skin daily. Remove & Discard patch within 12 hours or as directed by MD Patient not taking: Reported on 09/02/2018 08/09/18   Nuala Alpha A, PA-C  naproxen (NAPROSYN) 500 MG tablet Take 1 tablet (500 mg total) by mouth 2 (two) times daily with a meal. Patient not taking: Reported on 07/24/2018 06/26/18   Caccavale, Jillyn Ledger, PA-C    Family History Family History  Problem Relation Age of Onset   Hypertension Mother    Stroke Mother    Multiple sclerosis Sister    Down syndrome Son     Social History Social History   Tobacco Use   Smoking status: Never Smoker   Smokeless tobacco: Never Used  Substance Use Topics   Alcohol use: No   Drug use: No     Allergies   Patient has no known allergies.   Review of Systems Review of Systems  Constitutional: Negative for chills and fever.  HENT: Negative for congestion.   Eyes: Negative for visual disturbance.  Respiratory: Negative for cough and shortness of breath.   Cardiovascular: Positive for leg swelling. Negative for chest pain.  Gastrointestinal: Negative for nausea and vomiting.  Genitourinary: Negative for difficulty urinating.  Musculoskeletal: Positive for arthralgias.  Skin: Negative for color change.  Neurological: Negative for syncope and headaches.     Physical Exam Updated Vital Signs BP (!) 140/102 (BP Location: Right Arm)    Pulse 95    Temp 98.4 F (36.9 C) (Oral)    Resp 14    SpO2 96%   Physical Exam Vitals signs and nursing note reviewed.  Constitutional:      Appearance: He is not ill-appearing.     Comments: Anxious  appearing male.  HENT:     Head: Normocephalic and atraumatic.  Eyes:     Conjunctiva/sclera: Conjunctivae normal.  Neck:     Musculoskeletal: Neck supple.  Cardiovascular:     Rate and Rhythm: Normal rate and regular rhythm.     Pulses: Normal pulses.  Pulmonary:     Effort: Pulmonary effort is normal.     Breath sounds: Normal breath sounds. No wheezing, rhonchi or rales.  Abdominal:     Palpations: Abdomen is soft.     Tenderness: There is no abdominal tenderness.  Musculoskeletal:     Comments: Right lower extremity mildly swollen compared to left.  No overlying skin changes including erythema or increased warmth to the touch.  Patient does have tenderness to palpation along the calf as well as into the popliteal area.  Range of motion intact throughout hip, knee, ankle, toes.  Refill less than 2 seconds.  Strength 5 out of 5 bilaterally.  Sensation intact throughout.  2+ distal pulses bilaterally.   Skin:    General: Skin is warm and dry.  Neurological:     Mental Status: He is alert.      ED Treatments / Results  Labs (all labs ordered are listed, but only abnormal results are displayed) Labs Reviewed - No data to display  EKG EKG Interpretation  Date/Time:  Saturday November 15 2018 10:35:12 EDT Ventricular Rate:  91 PR Interval:    QRS Duration: 89 QT Interval:  402 QTC Calculation: 495 R Axis:   -30 Text Interpretation:  Sinus rhythm Ventricular premature complex Probable left atrial enlargement Inferior infarct, old Anterior infarct, old when compared to prior, simiar pvc.  No sTEMI Confirmed by Theda Belfastegeler, Chris ([id number]) on 11/15/2018 10:38:29 AM   Radiology Vas [place] Lower Extremity Venous (dvt) (only [facility] 7a-7p)  Result Date: 11/15/2018  Lower Venous Study Indications: Swelling, Pain, and history of right peroneal vein DVT.  Comparison Study: 10/21/2018 Performing Technologist: Gertie FeyMichelle Simonetti MHA, RDMS, RVT, RDCS  Examination Guidelines: A complete evaluation  includes B-mode imaging, spectral Doppler, color Doppler, and power Doppler as needed of all accessible portions of each vessel. Bilateral testing is considered an integral part of a complete examination. Limited examinations for reoccurring indications may be performed as noted.  +---------+---------------+---------+-----------+----------+--------------+  RIGHT     Compressibility Phasicity Spontaneity Properties Thrombus Aging  +---------+---------------+---------+-----------+----------+--------------+  CFV       Full            Yes       Yes                                    +---------+---------------+---------+-----------+----------+--------------+  SFJ       Full                                                             +---------+---------------+---------+-----------+----------+--------------+  FV Prox   Full                                                             +---------+---------------+---------+-----------+----------+--------------+  FV Mid    Full                                                             +---------+---------------+---------+-----------+----------+--------------+  FV Distal Full                                                             +---------+---------------+---------+-----------+----------+--------------+  PFV       Full                                                             +---------+---------------+---------+-----------+----------+--------------+  POP       Full            Yes       Yes                                    +---------+---------------+---------+-----------+----------+--------------+  PTV       Full                                                             +---------+---------------+---------+-----------+----------+--------------+  PERO      None                      No                     Acute           +---------+---------------+---------+-----------+----------+--------------+    +----+---------------+---------+-----------+----------+--------------+  LEFT Compressibility Phasicity Spontaneity Properties Thrombus Aging  +----+---------------+---------+-----------+----------+--------------+  CFV  Full            Yes       Yes                                    +----+---------------+---------+-----------+----------+--------------+   Summary: Right: Findings consistent with acute deep vein thrombosis involving the right peroneal veins. No cystic structure found in the popliteal fossa. No significant change when compared to prior study. Left: No evidence of common femoral vein obstruction.  *See table(s) above for measurements and observations.    Preliminary     Procedures Procedures (including critical care time)  Medications Ordered in ED Medications  HYDROcodone-acetaminophen (NORCO/VICODIN) 5-325 MG per tablet 1 tablet (1 tablet Oral Given 11/15/18 1101)     Initial Impression / Assessment and Plan / ED Course  I have reviewed the triage vital signs and the nursing notes.  Pertinent labs & imaging results that were available during my care of the patient were reviewed by me and considered in my medical decision making (see chart for details).    53 year old male who presents with continued right lower extremity pain after being diagnosed with a DVT to the peroneal veins on the right side on 8/04 in the ED.  Patient was started on Eliquis at that time and states he has not missed any doses.  He is very anxious and concerned that the blood clot could be moving and going into his lungs.  He has no complaints of chest pain, shortness of breath today.  He is satting 100% on room air and speaking in full sentences without accessory muscle use.  Endorses that he recently had a family member who passed away from a blood clot and this is concerning patient.  She has not been taking  anything for pain.  He was seen by his primary care physician last week and given a muscle relaxer  which did not help.  Patient states that the PCP did not even evaluate his leg.  He is in search of a new primary care physician.  He states that they did not re-prescribe his Eliquis for him.  He is almost out.   Discussed with patient that repeat ultrasound will not change course of management.  He would still like to have 1 to see if it has moved or gotten bigger.  Will give patient something in the ED for pain given this is his primary concern today as well.  This case with attending physician Dr. Rush Landmarkegeler who agrees with plan at this time.  Will likely need another day day supply of Eliquis.   Ultrasound unchanged from previous.  Blood clot has not migrated.  He reports his pain has improved with Norco here today.  He MDP reviewed.  Patient just had 90 Norco filled on 8/10.  Positive take this at home for his back pain as well as his leg pain.   Will prescribe 1 month of Eliquis today as well.   Patient will need his primary care physician to prescribe this to him.  Advised that he needs to follow-up sooner than 6 weeks.  Patient is in agreement with plan at this time and stable for discharge home.  This note was prepared using Dragon voice recognition software and may include unintentional dictation errors due to the inherent limitations of voice recognition software.       Final Clinical Impressions(s) / ED Diagnoses   Final diagnoses:  Right leg pain  Acute deep vein thrombosis (DVT) of right peroneal vein    ED Discharge Orders         Ordered    apixaban (ELIQUIS) 5 MG TABS tablet  2 times daily     11/15/18 1332           Tanda RockersVenter, Janira Mandell, [place] JerseyPA-C 11/15/18 1334    Tegeler, Canary Brimhristopher J, MD 11/15/18 [phone]

## 2018-11-15 NOTE — Progress Notes (Signed)
Right lower extremity venous duplex completed. Refer to "CV Proc" under chart review to view preliminary results.  11/15/2018 12:49 PM Maudry Mayhew, MHA, RVT, RDCS, RDMS

## 2018-11-15 NOTE — ED Notes (Signed)
ED Provider at bedside. 

## 2018-11-15 NOTE — ED Notes (Signed)
Pt given cup of water 

## 2018-11-15 NOTE — Discharge Instructions (Signed)
Your repeat ultrasound was unchanged from previous.  You still have a blood clot in your leg.  Continue taking Eliquis as prescribed.   For pain I recommend you take the Norco that you have at home.  Please follow-up with your primary care physician.  They will need to prescribe your Eliquis in the future.

## 2018-11-15 NOTE — ED Notes (Signed)
Pt discussed dc instructions and verbalsized understanding. Home stable with steady gait. Via wc.

## 2018-11-15 NOTE — ED Notes (Signed)
Pt walking in room with steady gait,

## 2018-11-21 ENCOUNTER — Ambulatory Visit
Admission: RE | Admit: 2018-11-21 | Discharge: 2018-11-21 | Disposition: A | Discharge status: Home / Self Care | Payer: Medicaid Other | Source: Ambulatory Visit | Attending: Orthopaedic Surgery | Admitting: Orthopaedic Surgery

## 2018-11-21 ENCOUNTER — Other Ambulatory Visit: Payer: Self-pay

## 2018-11-21 DIAGNOSIS — Z981 Arthrodesis status: Secondary | ICD-10-CM

## 2018-11-21 DIAGNOSIS — M545 Low back pain, unspecified: Secondary | ICD-10-CM

## 2018-11-21 MED ORDER — GADOBENATE DIMEGLUMINE 529 MG/ML IV SOLN
20.0000 mL | Freq: Once | INTRAVENOUS | Status: AC | PRN
  Administered 2018-11-21: 20 mL via INTRAVENOUS

## 2018-12-02 ENCOUNTER — Ambulatory Visit (INDEPENDENT_AMBULATORY_CARE_PROVIDER_SITE_OTHER): Payer: Medicaid Other | Admitting: Orthopaedic Surgery

## 2018-12-02 ENCOUNTER — Encounter: Payer: Self-pay | Admitting: Orthopaedic Surgery

## 2018-12-02 VITALS — Ht 68.0 in | Wt 240.0 lb

## 2018-12-02 DIAGNOSIS — Z981 Arthrodesis status: Secondary | ICD-10-CM

## 2018-12-03 NOTE — Progress Notes (Signed)
Office Visit Note   Patient: Justin [name]           Date of Birth: 1965-08-22           MRN: [id number] Visit Date: 12/02/2018              Requested by: [facility] [address] [place],  [place] [address] PCP: [facility]   Assessment & Plan: Visit Diagnoses:  1. Status post lumbar spinal fusion   2.     On Eliquis currently for peroneal vein DVT.  Previous history of warfarin induced coagulopathy, epidural bleed, pulmonary embolism. Plan: We reviewed the MRI scan again copies of report.  X-ray shows his fusion appears solid no loosening of screws good position of the cage.  MRI shows wide decompression no residual compression and all other levels above are normal.  We discussed continue walking program continue weight loss continued core strengthening program.  Follow-Up Instructions: Return if symptoms worsen or fail to improve.   Orders:  No orders of the defined types were placed in this encounter.  No orders of the defined types were placed in this encounter.     Procedures: No procedures performed   Clinical Data: No additional findings.   Subjective: Chief Complaint  Patient presents with  . Lower Back - Pain, Follow-up    MRI Lumbar Review     HPI 53 year old male returns with ongoing problems with back pain.  Previous L5-S1 fusion decompression 12/23/2017 with instrumented fusion.  He has had MRI scan is here for review.  He states when he stands too long he has discomfort sometimes his back feels tight.  He does have history of DVT has a vena cava filter and had a PE in the distant past.  He was having discomfort in his leg went to the emergency room and DVT was found in his right peroneal vein and he was placed on Eliquis which he is currently taking.  He takes hydrocodone 5 mg 3 times daily on chronic pain management.  He states he had an interval where he had no medication for 10 to 12 days and states he still had pain.  He denies any fever  chills no bowel bladder symptoms.  No lumbar wound problems.  Patient states he has lost weight now BMI 36.49 and weight 240.  Review of Systems positive for PE in the past he was on warfarin then had a subdural hematoma.  Possible bipolar sleep apnea obesity.  Lumbar fusion 12/23/2017 L5-S1.  Otherwise negative is obtains HPI.  Patient is on Eliquis for right peroneal vein DVT currently.   Objective: Vital Signs: Ht 5\' 8"  (1.727 m)   Wt 240 lb (108.9 kg)   BMI 36.49 kg/m   Physical Exam Constitutional:      Appearance: He is well-developed.  HENT:     Head: Normocephalic and atraumatic.  Eyes:     Pupils: Pupils are equal, round, and reactive to light.  Neck:     Thyroid: No thyromegaly.     Trachea: No tracheal deviation.  Cardiovascular:     Rate and Rhythm: Normal rate.  Pulmonary:     Effort: Pulmonary effort is normal.     Breath sounds: No wheezing.  Abdominal:     General: Bowel sounds are normal.     Palpations: Abdomen is soft.  Skin:    General: Skin is warm and dry.     Capillary Refill: Capillary refill takes less than 2  seconds.  Neurological:     Mental Status: He is alert and oriented to person, place, and time.  Psychiatric:        Behavior: Behavior normal.        Thought Content: Thought content normal.        Judgment: Judgment normal.     Ortho Exam lumbar incisions well-healed negative straight leg raising 90 degrees negative logroll to the hips.  Anterior tib gastrocsoleus is intact distal pulses are intact.  Negative Homan test.  Specialty Comments:  No specialty comments available.  Imaging:CLINICAL DATA:  Back pain with right-sided radiculopathy  EXAM: MRI LUMBAR SPINE WITHOUT AND WITH CONTRAST  TECHNIQUE: Multiplanar and multiecho pulse sequences of the lumbar spine were obtained without and with intravenous contrast.  CONTRAST:  20mL MULTIHANCE GADOBENATE DIMEGLUMINE 529 MG/ML IV SOLN  COMPARISON:  MRI 06/16/2017.  X-ray  09/02/2018  FINDINGS: Segmentation:  Standard.  Alignment:  5 mm grade 1 anterolisthesis L5 on S1.  Vertebrae: Susceptibility artifact from L5-S1 posterior spinal fusion slightly degrades evaluation of the adjacent structures. No evidence of fracture, discitis, or focal bone lesion.  Conus medullaris and cauda equina: Conus extends to the L1 level. Conus and cauda equina appear normal.  Paraspinal and other soft tissues: Postsurgical changes related to posterior decompression and fusion at L5-S1 with multiple foci of susceptibility. No fluid collections.  Disc levels:  T12-L1: No significant disc protrusion, foraminal stenosis, or canal stenosis.  L1-L2: No significant disc protrusion, foraminal stenosis, or canal stenosis.  L2-L3: No significant disc protrusion, foraminal stenosis, or canal stenosis.  L3-L4: No significant disc protrusion, foraminal stenosis, or canal stenosis.  L4-L5: No significant disc protrusion, foraminal stenosis, or canal stenosis.  L5-S1: Interval posterior decompression and spinal fusion no residual foraminal stenosis. Previously seen left foraminal cyst is no longer identified. No enhancing perineural scar. There is mild enhancement of postoperative scar at the site of posterior decompression. No enhancing fluid collections.  IMPRESSION: Interval posterior decompression and spinal fusion at L5-S1 with no residual foraminal or canal stenosis. No enhancing perineural scar tissue identified.   Electronically Signed   By: Duanne GuessNicholas  Plundo M.D.   On: 11/22/2018 15:08    PMFS History: Patient Active Problem List   Diagnosis Date Noted  . Status post lumbar spinal fusion 01/03/2018  . Lumbar stenosis 12/23/2017  . Spondylolisthesis, lumbar region 06/25/2017  . Achilles tendinitis of left lower extremity 12/07/2016  . Achilles tendinitis, left leg 05/17/2016  . Obstructive sleep apnea on CPAP 03/07/2015  . Post-operative  state 03/07/2015  . Achilles rupture, left 10/21/2014  . OSA (obstructive sleep apnea) 04/23/2013  . Bipolar disorder, unspecified (HCC) 01/07/2013  . Sinus tachycardia 01/07/2013  . Headache(784.0) 01/07/2013  . SDH (subdural hematoma) (HCC) 01/04/2013  . H/O PE 01/04/2013  . Chronic anticoagulation 01/04/2013  . 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  . Acute sinusitis 01/04/2013   Past Medical History:  Diagnosis Date  . Achilles rupture, left   . Bipolar 1 disorder (HCC)   . Dental caries    periodontitis  . Pneumonia   . Pulmonary embolism (HCC)   . Sleep apnea    wears CPAP  . Subdural hematoma (HCC) 2015    Family History  Problem Relation Age of Onset  . Hypertension Mother   . Stroke Mother   . Multiple sclerosis Sister   . Down syndrome Son     Past Surgical History:  Procedure Laterality Date  . ACHILLES TENDON SURGERY Left 10/21/2014   Procedure:  Left Achilles Reconstruction;  Surgeon: Newt Minion, MD;  Location: [facility];  Service: Orthopedics;  Laterality: Left;  . APPENDECTOMY    . CRANIOTOMY N/A 01/19/2013   Procedure: CRANIOTOMY HEMATOMA EVACUATION SUBDURAL;  Surgeon: Hosie Spangle, MD;  Location: [facility];  Service: Neurosurgery;  Laterality: N/A;  . CYSTECTOMY     right head  . ELBOW SURGERY     right  . FRACTURE SURGERY     finger  . I&D EXTREMITY Left 12/07/2016   Procedure: LEFT ACHILLES DEBRIDEMENT;  Surgeon: Newt Minion, MD;  Location: [place] [facility];  Service: Orthopedics;  Laterality: Left;  . LUMBAR FUSION  12/23/2017   L5 GILL PROCEDURE, RIGHT L5-S1, TRANSFORAMIAL LUMBAR INTERBODY FUSION, BILATERAL LATERAL FUSION, PEDICLE INSTRUMENTATION  . MULTIPLE EXTRACTIONS WITH ALVEOLOPLASTY N/A 03/07/2015   Procedure: MULTIPLE EXTRACTION WITH ALVEOLOPLASTY;  Surgeon: Diona Browner, DDS;  Location: [facility];  Service: Oral Surgery;  Laterality: N/A;   Social History   Occupational History  . Occupation: disability  Tobacco Use  . Smoking  status: Never Smoker  . Smokeless tobacco: Never Used  Substance and Sexual Activity  . Alcohol use: No  . Drug use: No  . Sexual activity: Not on file

## 2018-12-17 ENCOUNTER — Telehealth: Payer: Self-pay | Admitting: Orthopaedic Surgery

## 2018-12-17 NOTE — Telephone Encounter (Signed)
Patient called. He is at his doctor's office. They are asking to see his MRI. They asked if a copy could be faxed to [phone]

## 2018-12-17 NOTE — Telephone Encounter (Signed)
Faxed

## 2018-12-20 ENCOUNTER — Other Ambulatory Visit: Payer: Self-pay

## 2018-12-20 ENCOUNTER — Emergency Department (HOSPITAL_COMMUNITY)
Admission: EM | Admit: 2018-12-20 | Discharge: 2018-12-20 | Disposition: A | Discharge status: Home / Self Care | Payer: Medicaid Other | Attending: Emergency Medicine | Admitting: Emergency Medicine

## 2018-12-20 ENCOUNTER — Encounter (HOSPITAL_COMMUNITY): Payer: Self-pay | Admitting: Emergency Medicine

## 2018-12-20 DIAGNOSIS — Z7901 Long term (current) use of anticoagulants: Secondary | ICD-10-CM | POA: Diagnosis not present

## 2018-12-20 DIAGNOSIS — I82409 Acute embolism and thrombosis of unspecified deep veins of unspecified lower extremity: Secondary | ICD-10-CM | POA: Diagnosis not present

## 2018-12-20 DIAGNOSIS — G8929 Other chronic pain: Secondary | ICD-10-CM | POA: Diagnosis not present

## 2018-12-20 DIAGNOSIS — M545 Low back pain: Secondary | ICD-10-CM | POA: Diagnosis not present

## 2018-12-20 MED ORDER — LIDOCAINE 5 % EX PTCH
1.0000 | MEDICATED_PATCH | Freq: Once | CUTANEOUS | Status: DC
  Administered 2018-12-20: 1 via TRANSDERMAL
  Filled 2018-12-20: qty 1

## 2018-12-20 NOTE — ED Notes (Signed)
Discharge instructions and OTC medications discussed with Pt. Pt verbalized understanding. Pt stable and ambulatory.

## 2018-12-20 NOTE — ED Provider Notes (Addendum)
[facility] EMERGENCY DEPARTMENT Provider Note   CSN: [id number] Arrival date & time: 12/20/18  1217     History   Chief Complaint Chief Complaint  Patient presents with   Back Pain    HPI Justin [name] is a 53 y.o. male with PMHx bipolar disorder, sleep apnea on CPAP, hx of PE 10 years ago, recent diagnosis of right DVT on 10/21/18, chronic back pain s/p L5 S1 fusion, who presents to the ED today complaining of unchanged back pain since surgery last year.  Reports he recently had an MRI done and was seen by orthopedist Dr. Ophelia Charter who told him that his MRI looked normal but did not explain to him I am he still continued to have pain.  He is followed by pain clinic and takes #3 5 mg Norco per day.  He states despite this he continues to have pain.  He states that it is difficult for him to get up in the morning and difficult for him to get up when he is sitting on the couch for prolonged period of time.  Patient reports that he recently saw pain management who did not increase his dose of Norco but started him on Lyrica.  He states he has been taking this without relief.  Denies fever, chills, saddle anesthesia, urinary retention, urinary or bowel incontinence, weakness, numbness, paresthesias in lower legs, and any other associated symptoms.   Also currently complaining of gradual onset, constant, achy, right anterior thigh pain was 2 days.  No known injury.  No overuse.  Patient reports he is concerned that his blood clot has moved.  He states he was diagnosed with a blood clot at the beginning of August.  He has been compliant with his Eliquis and has not missed any doses.  Denies redness, increased warmth, swelling, any other associated symptoms.  Pt denies any chest pain or shortness of breath.        Past Medical History:  Diagnosis Date   Achilles rupture, left    Bipolar 1 disorder (HCC)    Dental caries    periodontitis   Pneumonia    Pulmonary embolism  (HCC)    Sleep apnea    wears CPAP   Subdural hematoma (HCC) 2015    Patient Active Problem List   Diagnosis Date Noted   Status post lumbar spinal fusion 01/03/2018   Lumbar stenosis 12/23/2017   Spondylolisthesis, lumbar region 06/25/2017   Achilles tendinitis of left lower extremity 12/07/2016   Achilles tendinitis, left leg 05/17/2016   Obstructive sleep apnea on CPAP 03/07/2015   Post-operative state 03/07/2015   Achilles rupture, left 10/21/2014   OSA (obstructive sleep apnea) 04/23/2013   Bipolar disorder, unspecified (HCC) 01/07/2013   Sinus tachycardia 01/07/2013   Headache(784.0) 01/07/2013   SDH (subdural hematoma) (HCC) 01/04/2013   H/O PE 01/04/2013   Chronic anticoagulation 01/04/2013   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   Acute sinusitis 01/04/2013    Past Surgical History:  Procedure Laterality Date   ACHILLES TENDON SURGERY Left 10/21/2014   Procedure: Left Achilles Reconstruction;  Surgeon: Nadara Mustard, MD;  Location: [facility];  Service: Orthopedics;  Laterality: Left;   APPENDECTOMY     CRANIOTOMY N/A 01/19/2013   Procedure: CRANIOTOMY HEMATOMA EVACUATION SUBDURAL;  Surgeon: Hewitt Shorts, MD;  Location: [facility];  Service: Neurosurgery;  Laterality: N/A;   CYSTECTOMY     right head   ELBOW SURGERY     right   FRACTURE  SURGERY     finger   I&D EXTREMITY Left 12/07/2016   Procedure: LEFT ACHILLES DEBRIDEMENT;  Surgeon: Newt Minion, MD;  Location: [place];  Service: Orthopedics;  Laterality: Left;   LUMBAR FUSION  12/23/2017   L5 GILL PROCEDURE, RIGHT L5-S1, TRANSFORAMIAL LUMBAR INTERBODY FUSION, BILATERAL LATERAL FUSION, PEDICLE INSTRUMENTATION   MULTIPLE EXTRACTIONS WITH ALVEOLOPLASTY N/A 03/07/2015   Procedure: MULTIPLE EXTRACTION WITH ALVEOLOPLASTY;  Surgeon: Diona Browner, DDS;  Location: [place];  Service: Oral Surgery;  Laterality: N/A;        Home Medications    Prior to Admission medications     Medication Sig Start Date End Date Taking? Authorizing Provider  apixaban (ELIQUIS) 5 MG TABS tablet Take 1 tablet (5 mg total) by mouth 2 (two) times daily. 11/15/18   Torres Hardenbrook, PA-C  doxepin (SINEQUAN) 10 MG capsule Take 20 mg by mouth at bedtime.  07/15/17   [provider]  HYDROcodone-acetaminophen (NORCO/VICODIN) 5-325 MG tablet  11/27/18   [provider]  hydrOXYzine (ATARAX/VISTARIL) 25 MG tablet Take 25 mg by mouth as needed for anxiety.  09/06/18   [provider]  lidocaine (LIDODERM) 5 % Place 1 patch onto the skin daily. Remove & Discard patch within 12 hours or as directed by MD Patient not taking: Reported on 09/02/2018 08/09/18   Nuala Alpha A, PA-C  lithium 600 MG capsule Take 600 mg by mouth at bedtime.    [provider]  methocarbamol (ROBAXIN) 500 MG tablet Take 1 tablet (500 mg total) by mouth 2 (two) times daily. 08/09/18   Nuala Alpha A, PA-C  mirtazapine (REMERON) 45 MG tablet Take 45 mg by mouth at bedtime.    [provider]  naproxen (NAPROSYN) 500 MG tablet Take 1 tablet (500 mg total) by mouth 2 (two) times daily with a meal. Patient not taking: Reported on 07/24/2018 06/26/18   Caccavale, Sophia, PA-C  QUEtiapine (SEROQUEL XR) 400 MG 24 hr tablet Take 800 mg by mouth at bedtime.    [provider]  Vitamin D, Ergocalciferol, (DRISDOL) 1.25 MG (50000 UT) CAPS capsule Take 50,000 Units by mouth once a week. 11/25/18   [provider]    Family History Family History  Problem Relation Age of Onset   Hypertension Mother    Stroke Mother    Multiple sclerosis Sister    Down syndrome Son     Social History Social History   Tobacco Use   Smoking status: Never Smoker   Smokeless tobacco: Never Used  Substance Use Topics   Alcohol use: No   Drug use: No     Allergies   Patient has no known allergies.   Review of Systems Review of Systems  Constitutional: Negative for chills  and fever.  Respiratory: Negative for shortness of breath.   Cardiovascular: Negative for chest pain.  Genitourinary: Negative for difficulty urinating.  Musculoskeletal: Positive for arthralgias and back pain (Chronic). Negative for joint swelling.  Skin: Negative for color change.  Neurological: Negative for weakness and numbness.     Physical Exam Updated Vital Signs BP (!) 129/91 (BP Location: Right Arm)    Pulse 93    Temp 98.3 F (36.8 C) (Oral)    Resp 16    SpO2 98%   Physical Exam Vitals signs and nursing note reviewed.  Constitutional:      Appearance: He is obese. He is not ill-appearing.  HENT:     Head: Normocephalic and atraumatic.  Eyes:  Conjunctiva/sclera: Conjunctivae normal.  Neck:     Musculoskeletal: Neck supple.  Cardiovascular:     Rate and Rhythm: Normal rate and regular rhythm.     Pulses: Normal pulses.     Comments: 2+ radial and 2+ DP pulses bilaterally. Pulmonary:     Effort: Pulmonary effort is normal.     Breath sounds: Normal breath sounds. No wheezing, rhonchi or rales.  Abdominal:     Palpations: Abdomen is soft.     Tenderness: There is no abdominal tenderness.  Musculoskeletal:     Right lower leg: No edema.     Left lower leg: No edema.     Comments: No C, T, L midline spinal tenderness.  Bilateral lumbar paraspinal tenderness. Mild tenderness to right anterior thigh. No overlying skin changes to thigh including redness, swelling, or increased warmth to the touch. Mild tenderness to right hip as well. ROM intact throughout. Strength 5/5 to hip flexion, hip extension, knee extension, knee flexion, plantarflexion, and dorsiflexion bilaterally. Sensation intact to dull and sharp touch.   Skin:    General: Skin is warm and dry.  Neurological:     Mental Status: He is alert.      ED Treatments / Results  Labs (all labs ordered are listed, but only abnormal results are displayed) Labs Reviewed - No data to  display  EKG None  Radiology No results found.  Procedures Procedures (including critical care time)  Medications Ordered in ED Medications  lidocaine (LIDODERM) 5 % 1 patch (has no administration in time range)     Initial Impression / Assessment and Plan / ED Course  I have reviewed the triage vital signs and the nursing notes.  Pertinent labs & imaging results that were available during my care of the patient were reviewed by me and considered in my medical decision making (see chart for details).    53 year old male who presents to the ED with multiple complaints.  History of chronic back pain and reports unchanged since L5-S1 fusion on 12/23/2017.  Recently had an MRI done on 9/04 with findings: IMPRESSION: Interval posterior decompression and spinal fusion at L5-S1 with no residual foraminal or canal stenosis. No enhancing perineural scar tissue identified.  Followed up with orthopedist Dr. Ophelia CharterYates 9/15 to review MRI.  Was told to continue seeing pain management for which he receives 90 Norco a month.  Advised to continue weight loss, core strengthening, walking program.  He reports he is frustrated because he continues to have back pain and no one is telling him why.  He states he is currently in the process of trying to see another orthopedist for further evaluation/second opinion.  No worsening pain.  No red flags today to suggest cauda equina.  He has no midline spinal tenderness on exam.  He is moving all extremities without difficulty. Strength 5 out of 5 bilaterally.  Unchanged back pain and recent MRI with no acute findings do not feel patient needs repeat imaging at this time.  It appears he is more frustrated with his chronic pain than anything else and would like answers.  He PDMP reviewed.  Patient recently prescribed #60 25 mg Lyrica on 09/30.  Is to continue taking this.  Advised to follow-up with orthopedist and pain management to discuss further options.   Also  complaining of anterior right thigh pain.  He was recently diagnosed with a DVT on 8/04.  He is concerned that his blood clot is moving.  I evaluated patient myself during  repeat ED visit on 8/29 similar concern.  We had lengthy discussion about need to continue taking Eliquis at that time and very unlikely that patient's blood clot had been moving with compliance of Eliquis.  We did obtain a repeat ultrasound at that time for reassurance which did not show any movement of the blood clot.  Had represcribed Eliquis at that time given patient had not followed up with his PCP for re-prescription.  He states he has since seen him and has been compliant with his Eliquis without any missed doses.  No overlying skin changes of the anterior thigh to suggest cellulitis.  Patient is afebrile in the ED without tachycardia or tachypnea.  He denies any injury to the thigh.  Does have some mild tenderness but suspect he has been compensating due to his back pain and has caused some muscle strain.  Again he takes #3 5 mg Norco per day for chronic back pain.  Advised to take this for his thigh pain as well.  Advised to rest, ice, elevate leg for comfort.     I have given pt a lidocaine patch in the ED for comfort. Advised to follow up with pain clinic in regards to continued pain in his back. Strict return precautions have been discussed with patient. He is in agreement with plan at this time and stable for discharge home. I was able to visualize patient ambulating out of the ED without difficulty.     Final Clinical Impressions(s) / ED Diagnoses   Final diagnoses:  Chronic right-sided low back pain without sciatica    ED Discharge Orders    None       Tanda Rockers, PA-C 12/20/18 1350    Tanda Rockers, PA-C 12/20/18 1416    Terald Sleeper, MD 12/21/18 0004

## 2018-12-20 NOTE — ED Triage Notes (Signed)
Pt reports right lower back pain "for a while", reports having surgery a year ago. States he is having difficulty getting out of bed due to the pain, had an MRI last month that didn't show a cause for his pain.  Has a known blood clot in right calf that he is on eliquis for. States the pain has moved to his thigh x 2 days and is concerned the blood clot is worse.

## 2018-12-20 NOTE — Discharge Instructions (Addendum)
Continue taking your pain medication and the Lyrica as prescribed  Follow up with your PCP as well as orthopedist Dr. Lorin Mercy for continued pain in your back Continue taking your Eliquis for your blood clot  Return to the ED for any worsening symptoms including worsening back pain, holding onto urine, peeing or pooping onto yourself, inability to walk, or if you can't feel your groin area

## 2018-12-26 ENCOUNTER — Telehealth: Payer: Self-pay | Admitting: *Deleted

## 2018-12-26 NOTE — Telephone Encounter (Signed)
Received call from [place] with Dr. Sherwood Gambler office stating they had received referral on this pt and Dr. Tawana Scale has declined seeing pt, no explanation was given.

## 2019-01-02 ENCOUNTER — Other Ambulatory Visit: Payer: Self-pay

## 2019-01-02 ENCOUNTER — Encounter (HOSPITAL_COMMUNITY): Payer: Self-pay | Admitting: Emergency Medicine

## 2019-01-02 ENCOUNTER — Emergency Department (HOSPITAL_COMMUNITY)
Admission: EM | Admit: 2019-01-02 | Discharge: 2019-01-02 | Disposition: A | Discharge status: Home / Self Care | Payer: Medicaid Other | Attending: Emergency Medicine | Admitting: Emergency Medicine

## 2019-01-02 ENCOUNTER — Emergency Department (HOSPITAL_COMMUNITY): Payer: Medicaid Other

## 2019-01-02 DIAGNOSIS — M25522 Pain in left elbow: Secondary | ICD-10-CM | POA: Diagnosis not present

## 2019-01-02 DIAGNOSIS — Z7901 Long term (current) use of anticoagulants: Secondary | ICD-10-CM | POA: Insufficient documentation

## 2019-01-02 DIAGNOSIS — R531 Weakness: Secondary | ICD-10-CM | POA: Diagnosis not present

## 2019-01-02 DIAGNOSIS — Z79899 Other long term (current) drug therapy: Secondary | ICD-10-CM | POA: Insufficient documentation

## 2019-01-02 MED ORDER — DICLOFENAC SODIUM 1 % TD GEL: 2.0000 g | Freq: Four times a day (QID) | TRANSDERMAL | 0 refills | Status: DC

## 2019-01-02 MED ORDER — PREDNISONE 20 MG PO TABS: ORAL_TABLET | ORAL | 0 refills | Status: DC

## 2019-01-02 NOTE — ED Provider Notes (Signed)
[facility] EMERGENCY DEPARTMENT Provider Note   CSN: [id number] Arrival date & time: 01/02/19  1323     History   Chief Complaint Chief Complaint  Patient presents with   Arm Pain    HPI Justin [name] is a 53 y.o. male.     The history is provided by the patient. No language interpreter was used.  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  Patient report for the past 2 days he has had persistent pain about his left elbow.  He described pain as a sharp shooting sensation down to his left pain with weakness about the hand.  States he was trying to undo a pill container and drops it due to the pain.  He denies any shoulder pain or neck pain.  Denies any associated fever or rash.  He denies any recent injury or doing repetitive work.  He is currently on Eliquis and have not missed any dose.  He does not complain of any chest pain or trouble breathing.  He is left-hand dominant.  Past Medical History:  Diagnosis Date   Achilles rupture, left    Bipolar 1 disorder (HCC)    Dental caries    periodontitis   Pneumonia    Pulmonary embolism (HCC)    Sleep apnea    wears CPAP   Subdural hematoma ([place]) 2015    Patient Active Problem List   Diagnosis Date Noted   Status post lumbar spinal fusion 01/03/2018   Lumbar stenosis 12/23/2017   Spondylolisthesis, lumbar region 06/25/2017   Achilles tendinitis of left lower extremity 12/07/2016   Achilles tendinitis, left leg 05/17/2016   Obstructive sleep apnea on CPAP 03/07/2015   Post-operative state 03/07/2015   Achilles rupture, left 10/21/2014   OSA (obstructive sleep apnea) 04/23/2013   Bipolar disorder, unspecified ([place]) 01/07/2013   Sinus tachycardia 01/07/2013   Headache(784.0) 01/07/2013   SDH (subdural hematoma) ([place]) 01/04/2013   H/O PE 01/04/2013    Chronic anticoagulation 01/04/2013   Warfarin-induced coagulopathy ([place]) 01/04/2013   Acute sinusitis 01/04/2013    Past Surgical History:  Procedure Laterality Date   ACHILLES TENDON SURGERY Left 10/21/2014   Procedure: Left Achilles Reconstruction;  Surgeon: Newt Minion, MD;  Location: [place];  Service: Orthopedics;  Laterality: Left;   APPENDECTOMY     CRANIOTOMY N/A 01/19/2013   Procedure: CRANIOTOMY HEMATOMA EVACUATION SUBDURAL;  Surgeon: Hosie Spangle, MD;  Location: [place] [facility];  Service: Neurosurgery;  Laterality: N/A;   CYSTECTOMY     right head   ELBOW SURGERY     right   FRACTURE SURGERY     finger   I&D EXTREMITY Left 12/07/2016   Procedure: LEFT ACHILLES DEBRIDEMENT;  Surgeon: Newt Minion, MD;  Location: [place];  Service: Orthopedics;  Laterality: Left;   LUMBAR FUSION  12/23/2017   L5 GILL PROCEDURE, RIGHT L5-S1, TRANSFORAMIAL LUMBAR INTERBODY FUSION, BILATERAL LATERAL FUSION, PEDICLE INSTRUMENTATION   MULTIPLE EXTRACTIONS WITH ALVEOLOPLASTY N/A 03/07/2015   Procedure: MULTIPLE EXTRACTION WITH ALVEOLOPLASTY;  Surgeon: Diona Browner, DDS;  Location: [place];  Service: Oral Surgery;  Laterality: N/A;        Home Medications    Prior to Admission medications   Medication Sig Start Date End Date Taking? Authorizing Provider  apixaban (ELIQUIS) 5 MG TABS tablet Take 1 tablet (5 mg total) by mouth 2 (two) times daily. 11/15/18  Hyman Hopes, Margaux, PA-C  doxepin (SINEQUAN) 10 MG capsule Take 20 mg by mouth at bedtime.  07/15/17   [provider]  HYDROcodone-acetaminophen (NORCO/VICODIN) 5-325 MG tablet  11/27/18   [provider]  hydrOXYzine (ATARAX/VISTARIL) 25 MG tablet Take 25 mg by mouth as needed for anxiety.  09/06/18   [provider]  lidocaine (LIDODERM) 5 % Place 1 patch onto the skin daily. Remove & Discard patch within 12 hours or as directed by MD Patient not taking: Reported on 09/02/2018 08/09/18   Harlene Salts A, PA-C    lithium 600 MG capsule Take 600 mg by mouth at bedtime.    [provider]  methocarbamol (ROBAXIN) 500 MG tablet Take 1 tablet (500 mg total) by mouth 2 (two) times daily. 08/09/18   Harlene Salts A, PA-C  mirtazapine (REMERON) 45 MG tablet Take 45 mg by mouth at bedtime.    [provider]  naproxen (NAPROSYN) 500 MG tablet Take 1 tablet (500 mg total) by mouth 2 (two) times daily with a meal. Patient not taking: Reported on 07/24/2018 06/26/18   Caccavale, Sophia, PA-C  QUEtiapine (SEROQUEL XR) 400 MG 24 hr tablet Take 800 mg by mouth at bedtime.    [provider]  Vitamin D, Ergocalciferol, (DRISDOL) 1.25 MG (50000 UT) CAPS capsule Take 50,000 Units by mouth once a week. 11/25/18   [provider]    Family History Family History  Problem Relation Age of Onset   Hypertension Mother    Stroke Mother    Multiple sclerosis Sister    Down syndrome Son     Social History Social History   Tobacco Use   Smoking status: Never Smoker   Smokeless tobacco: Never Used  Substance Use Topics   Alcohol use: No   Drug use: No     Allergies   Patient has no known allergies.   Review of Systems Review of Systems  Constitutional: Negative for fever.  Musculoskeletal: Positive for arthralgias.  Neurological: Positive for numbness.     Physical Exam Updated Vital Signs BP (!) 137/97 (BP Location: Right Arm)    Pulse 96    Temp 98.5 F (36.9 C) (Oral)    Resp 16    SpO2 98%   Physical Exam Vitals signs and nursing note reviewed.  Constitutional:      General: He is not in acute distress.    Appearance: He is well-developed.  HENT:     Head: Atraumatic.  Eyes:     Conjunctiva/sclera: Conjunctivae normal.  Neck:     Musculoskeletal: Neck supple.  Musculoskeletal:        General: Tenderness (Left elbow: Tenderness to lateral epicondyle on palpation with normal elbow flexion and extension.  Normal left grip strength, radial pulse 2+,  sensation is intact throughout all fingers.  Arm compartment is soft.) present.     Comments: Left wrist and left shoulder nontender.  No tenderness about the cervical midline spine.  Skin:    Findings: No rash.  Neurological:     Mental Status: He is alert.      ED Treatments / Results  Labs (all labs ordered are listed, but only abnormal results are displayed) Labs Reviewed - No data to display  EKG None  Radiology No results found.  Procedures Procedures (including critical care time)  Medications Ordered in ED Medications - No data to display   Initial Impression / Assessment and Plan / ED Course  I have reviewed the triage vital  signs and the nursing notes.  Pertinent labs & imaging results that were available during my care of the patient were reviewed by me and considered in my medical decision making (see chart for details).        BP (!) 137/97 (BP Location: Right Arm)    Pulse 96    Temp 98.5 F (36.9 C) (Oral)    Resp 16    SpO2 98%    Final Clinical Impressions(s) / ED Diagnoses   Final diagnoses:  Left elbow pain    ED Discharge Orders         Ordered    diclofenac sodium (VOLTAREN) 1 % GEL  4 times daily     01/02/19 1442    predniSONE (DELTASONE) 20 MG tablet     01/02/19 1442         2:38 PM Patient here with left elbow pain and complaints of weakness about his left arm.  He does have some tenderness to the dorsal aspect of the elbow suggestive of potential lateral epicondylitis.  He does not have any appreciable weakness on grip strength, radial pulses 2+, sensation is intact.  Forearm is soft.  Will prescribe steroid and Voltaren cream for comfort.  Rice therapy discussed.  Orthopedic referral given as needed.  Return precaution discussed.   [name], PA-C 01/02/19 1443    Alvira Monday, MD 01/03/19 [phone]

## 2019-01-02 NOTE — ED Triage Notes (Signed)
Patient reports L arm pain x 2 days, progressively worsening, mostly with movement and when he tries to grip things -states pain mostly located around elbow. Endorses similar episode to R arm years ago and he had a tendon issue. Denies known recent injury. Able to move in triage but reports severe pain. Radial pulses equal and strong.

## 2019-01-02 NOTE — Discharge Instructions (Signed)
Wrap your left elbow in compress for comfort.  Apply voltaren gel to affected area.  Take steroid as prescribe.  Follow up with orthopedist as needed.

## 2019-01-20 ENCOUNTER — Telehealth: Payer: Self-pay | Admitting: Orthopaedic Surgery

## 2019-01-20 NOTE — Telephone Encounter (Signed)
Patient called wanting to get a TENS unit for his patient.  He stated that his insurance [facility]) needs a OPT code.  CB#[phone].  Thank you.

## 2019-01-20 NOTE — Telephone Encounter (Signed)
I called he can get them at [facility].

## 2019-01-20 NOTE — Telephone Encounter (Signed)
Please advise. OK for TENS unit?

## 2019-02-11 ENCOUNTER — Other Ambulatory Visit: Payer: Self-pay

## 2019-02-11 ENCOUNTER — Encounter (HOSPITAL_COMMUNITY): Payer: Self-pay

## 2019-02-11 ENCOUNTER — Emergency Department (HOSPITAL_BASED_OUTPATIENT_CLINIC_OR_DEPARTMENT_OTHER): Payer: Medicaid Other

## 2019-02-11 ENCOUNTER — Emergency Department (HOSPITAL_COMMUNITY): Payer: Medicaid Other

## 2019-02-11 ENCOUNTER — Emergency Department (HOSPITAL_COMMUNITY)
Admission: EM | Admit: 2019-02-11 | Discharge: 2019-02-11 | Disposition: A | Discharge status: Home / Self Care | Payer: Medicaid Other | Attending: Emergency Medicine | Admitting: Emergency Medicine

## 2019-02-11 DIAGNOSIS — Z7901 Long term (current) use of anticoagulants: Secondary | ICD-10-CM | POA: Insufficient documentation

## 2019-02-11 DIAGNOSIS — M79609 Pain in unspecified limb: Secondary | ICD-10-CM | POA: Diagnosis not present

## 2019-02-11 DIAGNOSIS — M79604 Pain in right leg: Secondary | ICD-10-CM | POA: Diagnosis not present

## 2019-02-11 DIAGNOSIS — Z79899 Other long term (current) drug therapy: Secondary | ICD-10-CM | POA: Insufficient documentation

## 2019-02-11 DIAGNOSIS — Z86711 Personal history of pulmonary embolism: Secondary | ICD-10-CM | POA: Diagnosis not present

## 2019-02-11 DIAGNOSIS — F319 Bipolar disorder, unspecified: Secondary | ICD-10-CM | POA: Diagnosis not present

## 2019-02-11 LAB — CBC
HCT: 40.9 % (ref 39.0–52.0)
Hemoglobin: 13.6 g/dL (ref 13.0–17.0)
MCH: 27.8 pg (ref 26.0–34.0)
MCHC: 33.3 g/dL (ref 30.0–36.0)
MCV: 83.5 fL (ref 80.0–100.0)
Platelets: 249 10*3/uL (ref 150–400)
RBC: 4.9 MIL/uL (ref 4.22–5.81)
RDW: 14.6 % (ref 11.5–15.5)
WBC: 8.1 10*3/uL (ref 4.0–10.5)
nRBC: 0 % (ref 0.0–0.2)

## 2019-02-11 LAB — BASIC METABOLIC PANEL
Anion gap: 8 (ref 5–15)
BUN: 6 mg/dL (ref 6–20)
CO2: 24 mmol/L (ref 22–32)
Calcium: 9 mg/dL (ref 8.9–10.3)
Chloride: 106 mmol/L (ref 98–111)
Creatinine, Ser: 1.4 mg/dL — ABNORMAL HIGH (ref 0.61–1.24)
GFR calc Af Amer: >60 mL/min (ref >60)
GFR calc non Af Amer: 56 mL/min — ABNORMAL LOW (ref >60)
Glucose, Bld: 126 mg/dL — ABNORMAL HIGH (ref 70–99)
Potassium: 3.9 mmol/L (ref 3.5–5.1)
Sodium: 138 mmol/L (ref 135–145)

## 2019-02-11 NOTE — Discharge Instructions (Addendum)
The test results were reassuring today.  X-rays do not show any signs of fracture and ultrasound does not show any signs of blood clot.  Take over-the-counter medications as needed for pain.  Follow-up with your primary care doctor and orthopedic doctor if the symptoms persist.

## 2019-02-11 NOTE — ED Notes (Signed)
Patient transported to X-ray 

## 2019-02-11 NOTE — ED Triage Notes (Signed)
Pt reports increased right leg pain, pt told he has a blood clot in that leg 2 weeks ago, pt on Eliquis, States the pain is radiating up to his knee so he wants to make sure the clot isnt moving, denies any SOB or difficulty breathing. Pt a.o, ambulatory.

## 2019-02-11 NOTE — ED Provider Notes (Signed)
[facility] EMERGENCY DEPARTMENT Provider Note   CSN: [id number] Arrival date & time: 02/11/19  1047     History   Chief Complaint Chief Complaint  Patient presents with   Leg Pain    HPI Justin [name] is a 53 y.o. male.     HPI Patient presents emergency room for evaluation of right lower leg pain.  Patient has noticed for the last few days he is having pain behind his right knee and in his right calf.  He has a history of having pulmonary embolism in the past and is worried he might have a recurrent DVT.  He denies any recent injuries.  He is not having any back pain.  He has not noticed any particular redness or swelling.  Patient has been compliant with his medications. Past Medical History:  Diagnosis Date   Achilles rupture, left    Bipolar 1 disorder (HCC)    Dental caries    periodontitis   Pneumonia    Pulmonary embolism (HCC)    Sleep apnea    wears CPAP   Subdural hematoma (HCC) 2015    Patient Active Problem List   Diagnosis Date Noted   Status post lumbar spinal fusion 01/03/2018   Lumbar stenosis 12/23/2017   Spondylolisthesis, lumbar region 06/25/2017   Achilles tendinitis of left lower extremity 12/07/2016   Achilles tendinitis, left leg 05/17/2016   Obstructive sleep apnea on CPAP 03/07/2015   Post-operative state 03/07/2015   Achilles rupture, left 10/21/2014   OSA (obstructive sleep apnea) 04/23/2013   Bipolar disorder, unspecified (HCC) 01/07/2013   Sinus tachycardia 01/07/2013   Headache(784.0) 01/07/2013   SDH (subdural hematoma) (HCC) 01/04/2013   H/O PE 01/04/2013   Chronic anticoagulation 01/04/2013   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   Acute sinusitis 01/04/2013    Past Surgical History:  Procedure Laterality Date   ACHILLES TENDON SURGERY Left 10/21/2014   Procedure: Left Achilles Reconstruction;  Surgeon: Nadara Mustard, MD;  Location: [facility] OR;  Service: Orthopedics;   Laterality: Left;   APPENDECTOMY     CRANIOTOMY N/A 01/19/2013   Procedure: CRANIOTOMY HEMATOMA EVACUATION SUBDURAL;  Surgeon: Hewitt Shorts, MD;  Location: [facility];  Service: Neurosurgery;  Laterality: N/A;   CYSTECTOMY     right head   ELBOW SURGERY     right   FRACTURE SURGERY     finger   I&D EXTREMITY Left 12/07/2016   Procedure: LEFT ACHILLES DEBRIDEMENT;  Surgeon: Nadara Mustard, MD;  Location: [facility] OR;  Service: Orthopedics;  Laterality: Left;   LUMBAR FUSION  12/23/2017   L5 GILL PROCEDURE, RIGHT L5-S1, TRANSFORAMIAL LUMBAR INTERBODY FUSION, BILATERAL LATERAL FUSION, PEDICLE INSTRUMENTATION   MULTIPLE EXTRACTIONS WITH ALVEOLOPLASTY N/A 03/07/2015   Procedure: MULTIPLE EXTRACTION WITH ALVEOLOPLASTY;  Surgeon: [name], DDS;  Location: [facility] OR;  Service: Oral Surgery;  Laterality: N/A;        Home Medications    Prior to Admission medications   Medication Sig Start Date End Date Taking? Authorizing Provider  apixaban (ELIQUIS) 5 MG TABS tablet Take 1 tablet (5 mg total) by mouth 2 (two) times daily. 11/15/18   Hyman Hopes, Margaux, PA-C  diclofenac sodium (VOLTAREN) 1 % GEL Apply 2 g topically 4 (four) times daily. 01/02/19   [name], PA-C  doxepin (SINEQUAN) 10 MG capsule Take 20 mg by mouth at bedtime.  07/15/17   [provider]  HYDROcodone-acetaminophen (NORCO/VICODIN) 5-325 MG tablet  11/27/18   [provider]  hydrOXYzine (  ATARAX/VISTARIL) 25 MG tablet Take 25 mg by mouth as needed for anxiety.  09/06/18   [provider]  lidocaine (LIDODERM) 5 % Place 1 patch onto the skin daily. Remove & Discard patch within 12 hours or as directed by MD Patient not taking: Reported on 09/02/2018 08/09/18   Harlene Salts A, PA-C  lithium 600 MG capsule Take 600 mg by mouth at bedtime.    [provider]  methocarbamol (ROBAXIN) 500 MG tablet Take 1 tablet (500 mg total) by mouth 2 (two) times daily. 08/09/18   Harlene Salts A, PA-C    mirtazapine (REMERON) 45 MG tablet Take 45 mg by mouth at bedtime.    [provider]  naproxen (NAPROSYN) 500 MG tablet Take 1 tablet (500 mg total) by mouth 2 (two) times daily with a meal. Patient not taking: Reported on 07/24/2018 06/26/18   Caccavale, Sophia, PA-C  predniSONE (DELTASONE) 20 MG tablet 3 tabs po day one, then 2 tabs daily x 4 days 01/02/19   [name], PA-C  QUEtiapine (SEROQUEL XR) 400 MG 24 hr tablet Take 800 mg by mouth at bedtime.    [provider]  Vitamin D, Ergocalciferol, (DRISDOL) 1.25 MG (50000 UT) CAPS capsule Take 50,000 Units by mouth once a week. 11/25/18   [provider]    Family History Family History  Problem Relation Age of Onset   Hypertension Mother    Stroke Mother    Multiple sclerosis Sister    Down syndrome Son     Social History Social History   Tobacco Use   Smoking status: Never Smoker   Smokeless tobacco: Never Used  Substance Use Topics   Alcohol use: No   Drug use: No     Allergies   Patient has no known allergies.   Review of Systems Review of Systems  Constitutional: Negative for fever.  Respiratory: Negative for cough and shortness of breath.   Cardiovascular: Negative for chest pain.  Neurological: Negative for weakness and numbness.  All other systems reviewed and are negative.    Physical Exam Updated Vital Signs BP (!) 127/91    Pulse 89    Temp 98.5 F (36.9 C) (Oral)    Resp 18    Ht 1.753 m (5\' 9" )    Wt 104.3 kg    SpO2 94%    BMI 33.97 kg/m   Physical Exam Vitals signs and nursing note reviewed.  Constitutional:      General: He is not in acute distress.    Appearance: He is well-developed.  HENT:     Head: Normocephalic and atraumatic.     Right Ear: External ear normal.     Left Ear: External ear normal.  Eyes:     General: No scleral icterus.       Right eye: No discharge.        Left eye: No discharge.     Conjunctiva/sclera: Conjunctivae normal.  Neck:      Musculoskeletal: Neck supple.     Trachea: No tracheal deviation.  Cardiovascular:     Rate and Rhythm: Normal rate and regular rhythm.  Pulmonary:     Effort: Pulmonary effort is normal. No respiratory distress.     Breath sounds: Normal breath sounds. No stridor. No wheezing or rales.  Abdominal:     General: Bowel sounds are normal. There is no distension.     Palpations: Abdomen is soft.     Tenderness: There is no abdominal tenderness. There  is no guarding or rebound.  Musculoskeletal:        General: No tenderness.     Comments: Right lower leg greater in circumference than left, no pitting edema, mild tenderness palpation right popliteal fossa and right calf, strong dorsalis pedis pulses bilaterally, sensation intact  Skin:    General: Skin is warm and dry.     Findings: No rash.  Neurological:     Mental Status: He is alert.     Cranial Nerves: No cranial nerve deficit (no facial droop, extraocular movements intact, no slurred speech).     Sensory: No sensory deficit.     Motor: No abnormal muscle tone or seizure activity.     Coordination: Coordination normal.      ED Treatments / Results  Labs (all labs ordered are listed, but only abnormal results are displayed) Labs Reviewed  BASIC METABOLIC PANEL - Abnormal; Notable for the following components:      Result Value   Glucose, Bld 126 (*)    Creatinine, Ser 1.40 (*)    GFR calc non Af Amer 56 (*)    All other components within normal limits  CBC    EKG None  Radiology Dg Knee Complete 4 Views Right  Result Date: 02/11/2019 CLINICAL DATA:  41-year-old male with right knee pain. No known injury. EXAM: RIGHT KNEE - COMPLETE 4+ VIEW COMPARISON:  None. FINDINGS: There is no acute fracture or dislocation. The bones are well mineralized. No significant arthritic changes. No joint effusion. The soft tissues are unremarkable. IMPRESSION: Negative. Electronically Signed   By: Anner Crete M.D.   On: 02/11/2019  12:16   Vas [place] Lower Extremity Venous (dvt) (only [facility] 7a-7p)  Result Date: 02/11/2019  Lower Venous Study Indications: Right calf pain, patient concerned clot is moving.  Anticoagulation: Xarelto. Comparison Study: 11/15/18 right peroneal DVT Performing Technologist: June Leap RDMS, RVT  Examination Guidelines: A complete evaluation includes B-mode imaging, spectral Doppler, color Doppler, and power Doppler as needed of all accessible portions of each vessel. Bilateral testing is considered an integral part of a complete examination. Limited examinations for reoccurring indications may be performed as noted.  +---------+---------------+---------+-----------+----------+--------------+  RIGHT     Compressibility Phasicity Spontaneity Properties Thrombus Aging  +---------+---------------+---------+-----------+----------+--------------+  CFV       Full            Yes       Yes                                    +---------+---------------+---------+-----------+----------+--------------+  SFJ       Full                                                             +---------+---------------+---------+-----------+----------+--------------+  FV Prox   Full                                                             +---------+---------------+---------+-----------+----------+--------------+  FV Mid    Full                                                             +---------+---------------+---------+-----------+----------+--------------+  FV Distal Full                                                             +---------+---------------+---------+-----------+----------+--------------+  PFV       Full                                                             +---------+---------------+---------+-----------+----------+--------------+  POP       Full            Yes       Yes                                    +---------+---------------+---------+-----------+----------+--------------+  PTV       Full                                                              +---------+---------------+---------+-----------+----------+--------------+  PERO      Full                                                             +---------+---------------+---------+-----------+----------+--------------+     Summary: Right: There is no evidence of deep vein thrombosis in the lower extremity. No cystic structure found in the popliteal fossa. Ultrasound characteristics of enlarged lymph nodes are noted in the groin.  *See table(s) above for measurements and observations.    Preliminary     Procedures Procedures (including critical care time)  Medications Ordered in ED Medications - No data to display   Initial Impression / Assessment and Plan / ED Course  I have reviewed the triage vital signs and the nursing notes.  Pertinent labs & imaging results that were available during my care of the patient were reviewed by me and considered in my medical decision making (see chart for details).  Clinical Course as of Feb 10 1321  Wed Feb 11, 2019  1310 Laboratory tests reviewed.  No significant abnormalities.  X-rays Doppler study negative for acute   [JK]    Clinical Course User Index [JK] Linwood DibblesKnapp, Haruna Rohlfs, MD     Patient's exam is reassuring.  No evidence of vascular compromise.  X-rays labs and ultrasound do not show any signs of serious injury.  Patient to take over-the-counter medications.  Follow-up with his primary care doctor or orthopedic doctor the symptoms persist.  Final Clinical Impressions(s) / ED Diagnoses   Final diagnoses:  Pain of right lower extremity    ED Discharge Orders    None       Linwood DibblesKnapp, Rogan Ecklund, MD 02/11/19 1323

## 2019-02-11 NOTE — ED Notes (Signed)
Patient verbalizes understanding of discharge instructions. Opportunity for questioning and answers were provided. Armband removed by staff, pt discharged from ED.  

## 2019-02-11 NOTE — Progress Notes (Signed)
RLE venous duplex       has been completed. Preliminary results can be found under CV proc through chart review. Majestic Molony, BS, RDMS, RVT   

## 2019-04-23 ENCOUNTER — Other Ambulatory Visit: Payer: Self-pay | Admitting: Anesthesiology

## 2019-04-29 ENCOUNTER — Emergency Department (HOSPITAL_COMMUNITY): Payer: Medicaid Other

## 2019-04-29 ENCOUNTER — Emergency Department (HOSPITAL_COMMUNITY)
Admission: EM | Admit: 2019-04-29 | Discharge: 2019-04-29 | Disposition: A | Discharge status: Home / Self Care | Payer: Medicaid Other | Attending: Emergency Medicine | Admitting: Emergency Medicine

## 2019-04-29 ENCOUNTER — Other Ambulatory Visit: Payer: Self-pay

## 2019-04-29 ENCOUNTER — Encounter (HOSPITAL_COMMUNITY): Payer: Self-pay | Admitting: Emergency Medicine

## 2019-04-29 DIAGNOSIS — M7989 Other specified soft tissue disorders: Secondary | ICD-10-CM | POA: Diagnosis present

## 2019-04-29 DIAGNOSIS — Z79899 Other long term (current) drug therapy: Secondary | ICD-10-CM | POA: Insufficient documentation

## 2019-04-29 DIAGNOSIS — Z7901 Long term (current) use of anticoagulants: Secondary | ICD-10-CM | POA: Insufficient documentation

## 2019-04-29 DIAGNOSIS — Z86718 Personal history of other venous thrombosis and embolism: Secondary | ICD-10-CM | POA: Diagnosis not present

## 2019-04-29 DIAGNOSIS — M79604 Pain in right leg: Secondary | ICD-10-CM | POA: Insufficient documentation

## 2019-04-29 NOTE — ED Provider Notes (Signed)
[facility] EMERGENCY DEPARTMENT Provider Note   CSN: [id number] Arrival date & time: 04/29/19  1952     History Chief Complaint  Patient presents with  . Leg Pain    Justin [name] is a 54 y.o. male.  Patient with 1 week complaint of some discomfort to the right calf and some swelling.  Patient currently on Eliquis.  Patient has remote history of pulmonary embolus.  In August did have a right calf DVT.  Patient stated that he will be on Eliquis for ever.  Denies any chest pain or shortness of breath.        Past Medical History:  Diagnosis Date  . Achilles rupture, left   . Bipolar 1 disorder (HCC)   . Dental caries    periodontitis  . Pneumonia   . Pulmonary embolism (HCC)   . Sleep apnea    wears CPAP  . Subdural hematoma (HCC) 2015    Patient Active Problem List   Diagnosis Date Noted  . Status post lumbar spinal fusion 01/03/2018  . Lumbar stenosis 12/23/2017  . Spondylolisthesis, lumbar region 06/25/2017  . Achilles tendinitis of left lower extremity 12/07/2016  . Achilles tendinitis, left leg 05/17/2016  . Obstructive sleep apnea on CPAP 03/07/2015  . Post-operative state 03/07/2015  . Achilles rupture, left 10/21/2014  . OSA (obstructive sleep apnea) 04/23/2013  . Bipolar disorder, unspecified (HCC) 01/07/2013  . Sinus tachycardia 01/07/2013  . Headache(784.0) 01/07/2013  . SDH (subdural hematoma) (HCC) 01/04/2013  . H/O PE 01/04/2013  . Chronic anticoagulation 01/04/2013  . 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  . Acute sinusitis 01/04/2013    Past Surgical History:  Procedure Laterality Date  . ACHILLES TENDON SURGERY Left 10/21/2014   Procedure: Left Achilles Reconstruction;  Surgeon: Nadara Mustard, MD;  Location: [facility] OR;  Service: Orthopedics;  Laterality: Left;  . APPENDECTOMY    . CRANIOTOMY N/A 01/19/2013   Procedure: CRANIOTOMY HEMATOMA EVACUATION SUBDURAL;  Surgeon: Hewitt Shorts, MD;  Location: [facility];   Service: Neurosurgery;  Laterality: N/A;  . CYSTECTOMY     right head  . ELBOW SURGERY     right  . FRACTURE SURGERY     finger  . I & D EXTREMITY Left 12/07/2016   Procedure: LEFT ACHILLES DEBRIDEMENT;  Surgeon: Nadara Mustard, MD;  Location: [facility] OR;  Service: Orthopedics;  Laterality: Left;  . LUMBAR FUSION  12/23/2017   L5 GILL PROCEDURE, RIGHT L5-S1, TRANSFORAMIAL LUMBAR INTERBODY FUSION, BILATERAL LATERAL FUSION, PEDICLE INSTRUMENTATION  . MULTIPLE EXTRACTIONS WITH ALVEOLOPLASTY N/A 03/07/2015   Procedure: MULTIPLE EXTRACTION WITH ALVEOLOPLASTY;  Surgeon: [name], DDS;  Location: [facility] OR;  Service: Oral Surgery;  Laterality: N/A;       Family History  Problem Relation Age of Onset  . Hypertension Mother   . Stroke Mother   . Multiple sclerosis Sister   . Down syndrome Son     Social History   Tobacco Use  . Smoking status: Never Smoker  . Smokeless tobacco: Never Used  Substance Use Topics  . Alcohol use: No  . Drug use: No    Home Medications Prior to Admission medications   Medication Sig Start Date End Date Taking? Authorizing Provider  apixaban (ELIQUIS) 5 MG TABS tablet Take 1 tablet (5 mg total) by mouth 2 (two) times daily. 11/15/18   Hyman Hopes, Margaux, PA-C  diclofenac sodium (VOLTAREN) 1 % GEL Apply 2 g topically 4 (four) times daily. 01/02/19   [name], PA-C  doxepin (SINEQUAN) 10 MG capsule Take 20 mg by mouth at bedtime.  07/15/17   [provider]  HYDROcodone-acetaminophen (NORCO/VICODIN) 5-325 MG tablet  11/27/18   [provider]  hydrOXYzine (ATARAX/VISTARIL) 25 MG tablet Take 25 mg by mouth as needed for anxiety.  09/06/18   [provider]  lidocaine (LIDODERM) 5 % Place 1 patch onto the skin daily. Remove & Discard patch within 12 hours or as directed by MD Patient not taking: Reported on 09/02/2018 08/09/18   Harlene Salts A, PA-C  lithium 600 MG capsule Take 600 mg by mouth at bedtime.    [provider]    methocarbamol (ROBAXIN) 500 MG tablet Take 1 tablet (500 mg total) by mouth 2 (two) times daily. 08/09/18   Harlene Salts A, PA-C  mirtazapine (REMERON) 45 MG tablet Take 45 mg by mouth at bedtime.    [provider]  naproxen (NAPROSYN) 500 MG tablet Take 1 tablet (500 mg total) by mouth 2 (two) times daily with a meal. Patient not taking: Reported on 07/24/2018 06/26/18   Caccavale, Sophia, PA-C  predniSONE (DELTASONE) 20 MG tablet 3 tabs po day one, then 2 tabs daily x 4 days 01/02/19   [name], PA-C  QUEtiapine (SEROQUEL XR) 400 MG 24 hr tablet Take 800 mg by mouth at bedtime.    [provider]  Vitamin D, Ergocalciferol, (DRISDOL) 1.25 MG (50000 UT) CAPS capsule Take 50,000 Units by mouth once a week. 11/25/18   [provider]    Allergies    Patient has no known allergies.  Review of Systems   Review of Systems  Constitutional: Negative for chills and fever.  HENT: Negative for congestion, rhinorrhea and sore throat.   Eyes: Negative for visual disturbance.  Respiratory: Negative for cough and shortness of breath.   Cardiovascular: Positive for leg swelling. Negative for chest pain.  Gastrointestinal: Negative for abdominal pain, diarrhea, nausea and vomiting.  Genitourinary: Negative for dysuria.  Musculoskeletal: Negative for back pain and neck pain.  Skin: Negative for rash.  Neurological: Negative for dizziness, light-headedness and headaches.  Hematological: Does not bruise/bleed easily.  Psychiatric/Behavioral: Negative for confusion.    Physical Exam Updated Vital Signs BP (!) 146/94   Pulse 84   Temp 98.8 F (37.1 C) (Oral)   Resp 15   SpO2 98%   Physical Exam Vitals and nursing note reviewed.  Constitutional:      Appearance: Normal appearance. He is well-developed.  HENT:     Head: Normocephalic and atraumatic.  Eyes:     Extraocular Movements: Extraocular movements intact.     Conjunctiva/sclera: Conjunctivae normal.      Pupils: Pupils are equal, round, and reactive to light.  Cardiovascular:     Rate and Rhythm: Normal rate and regular rhythm.     Heart sounds: No murmur.  Pulmonary:     Effort: Pulmonary effort is normal. No respiratory distress.     Breath sounds: Normal breath sounds.  Abdominal:     Palpations: Abdomen is soft.     Tenderness: There is no abdominal tenderness.  Musculoskeletal:        General: Swelling present.     Cervical back: Normal range of motion and neck supple.     Comments: No edema or significant swelling at the ankle on the right leg.  Little bit of swelling to the calf.  Some mild tenderness.  No palpable cord.  Distally dorsalis pedis pulse is 2+.  Sensation intact as  well.  No swelling to the knee no erythema to the leg.  Skin:    General: Skin is warm and dry.     Capillary Refill: Capillary refill takes less than 2 seconds.  Neurological:     General: No focal deficit present.     Mental Status: He is alert.     Cranial Nerves: No cranial nerve deficit.     Sensory: No sensory deficit.     Motor: No weakness.     ED Results / Procedures / Treatments   Labs (all labs ordered are listed, but only abnormal results are displayed) Labs Reviewed - No data to display  EKG None  Radiology DG Tibia/Fibula Right  Result Date: 04/29/2019 CLINICAL DATA:  Lower leg cramping for 2 days. No recent injury. EXAM: RIGHT TIBIA AND FIBULA - 2 VIEW COMPARISON:  None. FINDINGS: There is no evidence of fracture or other focal bone lesions. No evidence of dislocation. Soft tissues are unremarkable. IMPRESSION: Negative radiographs of the right tibia and fibula. Electronically Signed   By: Audie Pinto M.D.   On: 04/29/2019 20:49    Procedures Procedures (including critical care time)  Medications Ordered in ED Medications - No data to display  ED Course  I have reviewed the triage vital signs and the nursing notes.  Pertinent labs & imaging results that were  available during my care of the patient were reviewed by me and considered in my medical decision making (see chart for details).    MDM Rules/Calculators/A&P                     Patient with significant past history for pulmonary embolus and DVT to the right leg recently in August.  Patient still on Eliquis.  Patient with 1 week history of some discomfort to the right leg and some swelling in the calf area.  No chest pain no shortness of breath.  Oxygen saturations here are 96%.  Not tachycardic.  No clinical concerns at this time for pulmonary embolus.  Will arrange outpatient vascular study Doppler study to rule out DVT.  Currently not available here since it is past 7 PM in the evening.  Patient instructed if is not contacted by the vascular service to have the Doppler studies return to the emergency department that the Dopplers can be done up to 7 PM Monday through Friday.  Patient currently nontoxic no acute distress. Final Clinical Impression(s) / ED Diagnoses Final diagnoses:  Right leg pain    Rx / DC Orders ED Discharge Orders         Ordered    LE VENOUS     04/29/19 2200           Fredia Sorrow, MD 04/29/19 2206

## 2019-04-29 NOTE — Discharge Instructions (Signed)
Vascular lab will contact you for the ultrasound Doppler study of the leg to rule out a deep vein thrombosis.  If you do not hear from them return here.  Vascular studies can be done up until 7 PM Monday through Friday.  Return for any chest pain or shortness of breath regardless.  Continue your Eliquis.

## 2019-04-29 NOTE — ED Notes (Signed)
Discharge instructions discussed with pt. Pt verbalized understanding with no questions at this time. Pt to return tomorrow for vascular.

## 2019-04-29 NOTE — ED Triage Notes (Signed)
Patient reports pain at right lower leg with mild swelling onset this week .

## 2019-04-30 ENCOUNTER — Ambulatory Visit (HOSPITAL_COMMUNITY)
Admission: RE | Admit: 2019-04-30 | Discharge: 2019-04-30 | Disposition: A | Discharge status: Home / Self Care | Payer: Medicaid Other | Source: Ambulatory Visit | Attending: Emergency Medicine | Admitting: Emergency Medicine

## 2019-04-30 DIAGNOSIS — R52 Pain, unspecified: Secondary | ICD-10-CM

## 2019-04-30 DIAGNOSIS — M7989 Other specified soft tissue disorders: Secondary | ICD-10-CM | POA: Diagnosis not present

## 2019-04-30 DIAGNOSIS — M79661 Pain in right lower leg: Secondary | ICD-10-CM | POA: Diagnosis not present

## 2019-04-30 NOTE — Progress Notes (Signed)
Lower extremity venous has been completed.   Preliminary results in CV Proc.   Blanch Media 04/30/2019 11:33 AM

## 2019-05-12 NOTE — Pre-Procedure Instructions (Signed)
[facility], [place] - [address] [place] [address] Phone: [phone] Fax: [phone]  [facility] [phone] - [place], [place] - [address] AT [facility] OF [address] & [address] [place] [address] Phone: [phone] Fax: [phone]    Your procedure is scheduled on Fri., Feb. 26, 2021 from 10:10AM-11:41AM  Report to [facility] Entrance "A" at 8:10AM  Call this number if you have problems the morning of surgery:  [phone]   Remember:  Do not eat or drink after midnight on Feb. 25th    Take these medicines the morning of surgery with A SIP OF WATER: Lithium  If Needed:  HydrOXYzine (ATARAX/VISTARIL)  Follow your surgeon's instructions on when to stop Eliquis.  If no instructions were given by your surgeon then you will need to call the office to get those instructions.    As of today, stop taking all Aspirin (unless instructed by your doctor) and Other Aspirin containing products, Vitamins, Fish oils, and Herbal medications. Also stop all NSAIDS i.e. Advil, Ibuprofen, Motrin, Aleve, Anaprox, Naproxen, BC, Goody Powders, and all Supplements.  No Smoking of any kind, Tobacco, or Alcohol products 24 hours prior to your procedure. If you use a Cpap at night, you may bring all equipment for your overnight stay.    Do not wear jewelry.  Do not wear lotions, powders, colognes, or deodorant.  Do not shave 48 hours prior to surgery.  Men may shave face and neck.  Do not bring valuables to the hospital.  [facility] is not responsible for any belongings or valuables.  Contacts, dentures or bridgework may not be worn into surgery.   For patients admitted to the hospital, discharge time will be determined by your treatment team.  Patients discharged the day of surgery will not be allowed to drive home.    Special instructions:  [facility]- Preparing For  Surgery  Before surgery, you can play an important role. Because skin is not sterile, your skin needs to be as free of germs as possible. You can reduce the number of germs on your skin by washing with CHG (chlorahexidine gluconate) Soap before surgery.  CHG is an antiseptic cleaner which kills germs and bonds with the skin to continue killing germs even after washing.    Oral Hygiene is also important to reduce your risk of infection.  Remember - BRUSH YOUR TEETH THE MORNING OF SURGERY WITH YOUR REGULAR TOOTHPASTE  Please do not use if you have an allergy to CHG or antibacterial soaps. If your skin becomes reddened/irritated stop using the CHG.  Do not shave (including legs and underarms) for at least 48 hours prior to first CHG shower. It is OK to shave your face.  Please follow these instructions carefully.   1. Shower the NIGHT BEFORE SURGERY and the MORNING OF SURGERY with CHG.   2. If you chose to wash your hair, wash your hair first as usual with your normal shampoo.  3. After you shampoo, rinse your hair and body thoroughly to remove the shampoo.  4. Use CHG as you would any other liquid soap. You can apply CHG directly to the skin and wash gently with a scrungie or a clean washcloth.   5. Apply the CHG Soap to your body ONLY FROM THE NECK DOWN.  Do not use on open wounds or open sores. Avoid contact with your eyes, ears, mouth and genitals (private parts). Wash Face and  genitals (private parts)  with your normal soap.  6. Wash thoroughly, paying special attention to the area where your surgery will be performed.  7. Thoroughly rinse your body with warm water from the neck down.  8. DO NOT shower/wash with your normal soap after using and rinsing off the CHG Soap.  9. Pat yourself dry with a CLEAN TOWEL.  10. Wear CLEAN PAJAMAS to bed the night before surgery, wear comfortable clothes the morning of surgery  11. Place CLEAN SHEETS on your bed the night of your first shower and  DO NOT SLEEP WITH PETS.   Day of Surgery:  Do not apply any deodorants/lotions.  Please wear clean clothes to the hospital/surgery center.   Remember to brush your teeth WITH YOUR REGULAR TOOTHPASTE.  Please read over the following fact sheets that you were given.

## 2019-05-13 ENCOUNTER — Other Ambulatory Visit: Payer: Self-pay

## 2019-05-13 ENCOUNTER — Other Ambulatory Visit (HOSPITAL_COMMUNITY)
Admission: RE | Admit: 2019-05-13 | Discharge: 2019-05-13 | Disposition: A | Discharge status: Home / Self Care | Payer: Medicaid Other | Source: Ambulatory Visit | Attending: Anesthesiology | Admitting: Anesthesiology

## 2019-05-13 ENCOUNTER — Encounter (HOSPITAL_COMMUNITY)
Admission: RE | Admit: 2019-05-13 | Discharge: 2019-05-13 | Disposition: A | Discharge status: Home / Self Care | Payer: Medicaid Other | Source: Ambulatory Visit | Attending: Anesthesiology | Admitting: Anesthesiology

## 2019-05-13 ENCOUNTER — Encounter (HOSPITAL_COMMUNITY): Payer: Self-pay

## 2019-05-13 DIAGNOSIS — Z01812 Encounter for preprocedural laboratory examination: Secondary | ICD-10-CM | POA: Insufficient documentation

## 2019-05-13 DIAGNOSIS — Z01818 Encounter for other preprocedural examination: Secondary | ICD-10-CM | POA: Insufficient documentation

## 2019-05-13 DIAGNOSIS — Z20822 Contact with and (suspected) exposure to covid-19: Secondary | ICD-10-CM | POA: Insufficient documentation

## 2019-05-13 LAB — SURGICAL PCR SCREEN
MRSA, PCR: NEGATIVE
Staphylococcus aureus: POSITIVE — AB

## 2019-05-13 LAB — CBC
HCT: 39.5 % (ref 39.0–52.0)
Hemoglobin: 12.9 g/dL — ABNORMAL LOW (ref 13.0–17.0)
MCH: 27.2 pg (ref 26.0–34.0)
MCHC: 32.7 g/dL (ref 30.0–36.0)
MCV: 83.3 fL (ref 80.0–100.0)
Platelets: 261 10*3/uL (ref 150–400)
RBC: 4.74 MIL/uL (ref 4.22–5.81)
RDW: 14.4 % (ref 11.5–15.5)
WBC: 7.5 10*3/uL (ref 4.0–10.5)
nRBC: 0 % (ref 0.0–0.2)

## 2019-05-13 LAB — SARS CORONAVIRUS 2 (TAT 6-24 HRS): SARS Coronavirus 2: NEGATIVE

## 2019-05-13 NOTE — Progress Notes (Signed)
PCP - [facility]   Chest x-ray - n/a EKG - 11-15-18  SA - yes, wears CPAP  Blood Thinner Instructions: Follow surgeon's instructions on when to stop Eliquis.  Last dose was 05-12-19.    COVID TEST- 05-13-19   Anesthesia review: n/a  Patient denies shortness of breath, fever, cough and chest pain at PAT appointment   All instructions explained to the patient, with a verbal understanding of the material. Patient agrees to go over the instructions while at home for a better understanding. Patient also instructed to self quarantine after being tested for COVID-19. The opportunity to ask questions was provided.

## 2019-05-14 ENCOUNTER — Ambulatory Visit (HOSPITAL_COMMUNITY): Payer: Medicaid Other | Admitting: Anesthesiology

## 2019-05-15 ENCOUNTER — Ambulatory Visit (HOSPITAL_COMMUNITY)
Admission: RE | Admit: 2019-05-15 | Discharge: 2019-05-15 | Disposition: A | Discharge status: Home / Self Care | Payer: Medicaid Other | Attending: Anesthesiology | Admitting: Anesthesiology

## 2019-05-15 ENCOUNTER — Encounter (HOSPITAL_COMMUNITY): Payer: Self-pay | Admitting: Anesthesiology

## 2019-05-15 ENCOUNTER — Other Ambulatory Visit: Payer: Self-pay | Admitting: Anesthesiology

## 2019-05-15 ENCOUNTER — Other Ambulatory Visit: Payer: Self-pay

## 2019-05-15 ENCOUNTER — Encounter (HOSPITAL_COMMUNITY)
Admission: RE | Disposition: A | Discharge status: Home / Self Care | Payer: Self-pay | Source: Home / Self Care | Attending: Anesthesiology

## 2019-05-15 ENCOUNTER — Other Ambulatory Visit (HOSPITAL_COMMUNITY)
Admit: 2019-05-15 | Discharge: 2019-05-15 | Disposition: A | Discharge status: Home / Self Care | Payer: Medicaid Other | Attending: Anesthesiology | Admitting: Anesthesiology

## 2019-05-15 DIAGNOSIS — Z538 Procedure and treatment not carried out for other reasons: Secondary | ICD-10-CM | POA: Insufficient documentation

## 2019-05-15 DIAGNOSIS — Z20822 Contact with and (suspected) exposure to covid-19: Secondary | ICD-10-CM | POA: Insufficient documentation

## 2019-05-15 DIAGNOSIS — M961 Postlaminectomy syndrome, not elsewhere classified: Secondary | ICD-10-CM | POA: Diagnosis not present

## 2019-05-15 LAB — SARS CORONAVIRUS 2 (TAT 6-24 HRS): SARS Coronavirus 2: NEGATIVE

## 2019-05-15 LAB — PROTIME-INR
INR: 1 (ref 0.8–1.2)
Prothrombin Time: 13.2 seconds (ref 11.4–15.2)

## 2019-05-15 SURGERY — INSERTION, SPINAL CORD STIMULATOR, LUMBAR
Anesthesia: General

## 2019-05-15 MED ORDER — FENTANYL CITRATE (PF) 250 MCG/5ML IJ SOLN
INTRAMUSCULAR | Status: AC
  Filled 2019-05-15: qty 5

## 2019-05-15 MED ORDER — ACETAMINOPHEN 500 MG PO TABS: 1000.0000 mg | ORAL_TABLET | Freq: Once | ORAL | Status: AC

## 2019-05-15 MED ORDER — PROPOFOL 10 MG/ML IV BOLUS
INTRAVENOUS | Status: AC
  Filled 2019-05-15: qty 20

## 2019-05-15 MED ORDER — MIDAZOLAM HCL 2 MG/2ML IJ SOLN
INTRAMUSCULAR | Status: AC
  Filled 2019-05-15: qty 2

## 2019-05-15 MED ORDER — CHLORHEXIDINE GLUCONATE CLOTH 2 % EX PADS: 6.0000 | MEDICATED_PAD | Freq: Once | CUTANEOUS | Status: DC

## 2019-05-15 MED ORDER — CEFAZOLIN SODIUM-DEXTROSE 2-4 GM/100ML-% IV SOLN
INTRAVENOUS | Status: AC
  Filled 2019-05-15: qty 100

## 2019-05-15 MED ORDER — ACETAMINOPHEN 500 MG PO TABS
ORAL_TABLET | ORAL | Status: AC
  Administered 2019-05-15: 1000 mg via ORAL
  Filled 2019-05-15: qty 2

## 2019-05-15 MED ORDER — CEFAZOLIN SODIUM-DEXTROSE 2-4 GM/100ML-% IV SOLN: 2.0000 g | INTRAVENOUS | Status: DC

## 2019-05-15 NOTE — Anesthesia Preprocedure Evaluation (Addendum)
Anesthesia Evaluation  Patient identified by MRN, date of birth, ID band Patient awake    Reviewed: Allergy & Precautions, NPO status , Patient's Chart, lab work & pertinent test results  Airway Mallampati: II  TM Distance: >3 FB Neck ROM: Full    Dental  (+) Dental Advisory Given, Upper Dentures   Pulmonary sleep apnea and Continuous Positive Airway Pressure Ventilation ,  H/o PE    Pulmonary exam normal breath sounds clear to auscultation       Cardiovascular Normal cardiovascular exam Rhythm:Regular Rate:Normal  Echo 03/26/2016: Study Conclusions   - Left ventricle: The cavity size was normal. Wall thickness was  increased in a pattern of mild LVH. The estimated ejection  fraction was 40%. Diffuse hypokinesis. Doppler parameters are  consistent with abnormal left ventricular relaxation (grade 1  diastolic dysfunction).  - Aortic valve: There was no stenosis.  - Mitral valve: There was no significant regurgitation.  - Right ventricle: The cavity size was normal. Systolic function  was normal.  - Pulmonary arteries: No complete TR doppler jet so unable to  estimate PA systolic pressure.  - Inferior vena cava: The vessel was normal in size. The  respirophasic diameter changes were in the normal range (>= 50%),  consistent with normal central venous pressure.    Neuro/Psych  Headaches, PSYCHIATRIC DISORDERS Bipolar Disorder Lumbar post laminectomy syndrome  Subdural hematoma 2015     GI/Hepatic negative GI ROS, Neg liver ROS,   Endo/Other  Obesity   Renal/GU negative Renal ROS     Musculoskeletal negative musculoskeletal ROS (+)   Abdominal   Peds  Hematology  (+) Blood dyscrasia (Eliquis), anemia ,   Anesthesia Other Findings Day of surgery medications reviewed with the patient.  Reproductive/Obstetrics                           Anesthesia Physical Anesthesia  Plan  ASA: III  Anesthesia Plan: General   Post-op Pain Management:    Induction: Intravenous  PONV Risk Score and Plan: 2 and Ondansetron, Dexamethasone and Midazolam  Airway Management Planned: Oral ETT  Additional Equipment:   Intra-op Plan:   Post-operative Plan: Extubation in OR  Informed Consent: I have reviewed the patients History and Physical, chart, labs and discussed the procedure including the risks, benefits and alternatives for the proposed anesthesia with the patient or authorized representative who has indicated his/her understanding and acceptance.     Dental advisory given  Plan Discussed with: CRNA and Anesthesiologist  Anesthesia Plan Comments:        Anesthesia Quick Evaluation

## 2019-05-15 NOTE — Progress Notes (Signed)
[facility] [address] [place], [place] - [address] [place] [address] Phone: [phone] Fax: [phone]  [facility] [phone] - [place], [place] - [address] AT [facility] OF [address] & [address] [place] [place] [address] Phone: [phone] Fax: [phone]      Your procedure is scheduled on Monday, March 1st .  Report to [facility] Main Entrance "A" at 5:30 A.M., and check in at the Admitting office.  Call this number if you have problems the morning of surgery:  [phone]  Call [phone] if you have any questions prior to your surgery date Monday-Friday 8am-4pm    Remember:  Do not eat or drink after midnight the night before your surgery     Take these medicines the morning of surgery with A SIP OF WATER   Lithium  Hydroxyzine (Atarax / Vistaril) - if needed    DO NOT TAKE ELIQUIS  STOP taking any Aspirin (unless otherwise instructed by your surgeon), Aleve, Naproxen, Ibuprofen, Motrin, Advil, Goody's, BC's, all herbal medications, fish oil, and all vitamins.    The Morning of Surgery  Do not wear jewelry.  Do not wear lotions, powders, colognes, or deodorant  Men may shave face and neck.  Do not bring valuables to the hospital.  [facility] is not responsible for any belongings or valuables.  If you are a smoker, DO NOT Smoke 24 hours prior to surgery  If you wear a CPAP at night please bring your mask the morning of surgery   Remember that you must have someone to transport you home after your surgery, and remain with you for 24 hours if you are discharged the same day.   Please bring cases for contacts, glasses, hearing aids, dentures or bridgework because it cannot be worn into surgery.    Leave your suitcase in the car.  After surgery it may be brought to your room.  For patients admitted to the hospital, discharge time will be determined by your treatment  team.  Patients discharged the day of surgery will not be allowed to drive home.    Special instructions:   [facility]- Preparing For Surgery  Before surgery, you can play an important role. Because skin is not sterile, your skin needs to be as free of germs as possible. You can reduce the number of germs on your skin by washing with CHG (chlorahexidine gluconate) Soap before surgery.  CHG is an antiseptic cleaner which kills germs and bonds with the skin to continue killing germs even after washing.    Oral Hygiene is also important to reduce your risk of infection.  Remember - BRUSH YOUR TEETH THE MORNING OF SURGERY WITH YOUR REGULAR TOOTHPASTE  Please do not use if you have an allergy to CHG or antibacterial soaps. If your skin becomes reddened/irritated stop using the CHG.  Do not shave (including legs and underarms) for at least 48 hours prior to first CHG shower. It is OK to shave your face.  Please follow these instructions carefully.   1. Shower the NIGHT BEFORE SURGERY and the MORNING OF SURGERY with CHG Soap.   2. If you chose to wash your hair, wash your hair first as usual with your normal shampoo.  3. After you shampoo, rinse your hair and body thoroughly to remove the shampoo.  4. Use CHG as you would any other liquid soap. You can apply CHG directly to the skin and wash gently with a scrungie  or a clean washcloth.   5. Apply the CHG Soap to your body ONLY FROM THE NECK DOWN.  Do not use on open wounds or open sores. Avoid contact with your eyes, ears, mouth and genitals (private parts). Wash Face and genitals (private parts)  with your normal soap.   6. Wash thoroughly, paying special attention to the area where your surgery will be performed.  7. Thoroughly rinse your body with warm water from the neck down.  8. DO NOT shower/wash with your normal soap after using and rinsing off the CHG Soap.  9. Pat yourself dry with a CLEAN TOWEL.  10. Wear CLEAN PAJAMAS to bed  the night before surgery, wear comfortable clothes the morning of surgery  11. Place CLEAN SHEETS on your bed the night of your first shower and DO NOT SLEEP WITH PETS.    Day of Surgery:  Please shower the morning of surgery with the CHG soap Do not apply any deodorants/lotions. Please wear clean clothes to the hospital/surgery center.   Remember to brush your teeth WITH YOUR REGULAR TOOTHPASTE.   Please read over the following fact sheets that you were given.

## 2019-05-15 NOTE — Progress Notes (Signed)
Patient initially informed nurse that he took his last dose of Eliquis on the 23rd.   After speaking with the anesthesiologist, patient stated he took Eliquis at night on the 24th.  Dr. Ollen Bowl at bedside and confirmed that patient's last dose of Eliquis was the 24th.    Procedure cancelled by Dr. Ollen Bowl.

## 2019-05-15 NOTE — Progress Notes (Signed)
Patient states that his sister took off work for the day and will be staying with him after surgery.  Per patient, sister is on the way to pick him up and bring him to the hospital and will take him home after surgery.

## 2019-05-17 NOTE — Anesthesia Preprocedure Evaluation (Addendum)
Anesthesia Evaluation    Reviewed: Allergy & Precautions, Patient's Chart, lab work & pertinent test results  Airway Mallampati: II  TM Distance: >3 FB Neck ROM: Full    Dental  (+) Upper Dentures, Edentulous Upper, Dental Advisory Given   Pulmonary sleep apnea and Continuous Positive Airway Pressure Ventilation ,  H/o PE    Pulmonary exam normal breath sounds clear to auscultation       Cardiovascular Normal cardiovascular exam Rhythm:Regular Rate:Normal  Echo 03/26/2016: Study Conclusions   - Left ventricle: The cavity size was normal. Wall thickness was  increased in a pattern of mild LVH. The estimated ejection  fraction was 40%. Diffuse hypokinesis. Doppler parameters are  consistent with abnormal left ventricular relaxation (grade 1  diastolic dysfunction).  - Aortic valve: There was no stenosis.  - Mitral valve: There was no significant regurgitation.  - Right ventricle: The cavity size was normal. Systolic function  was normal.  - Pulmonary arteries: No complete TR doppler jet so unable to  estimate PA systolic pressure.  - Inferior vena cava: The vessel was normal in size. The  respirophasic diameter changes were in the normal range (>= 50%),  consistent with normal central venous pressure.    Neuro/Psych  Headaches, PSYCHIATRIC DISORDERS Bipolar Disorder Lumbar post laminectomy syndrome  Subdural hematoma 2015     GI/Hepatic negative GI ROS, Neg liver ROS,   Endo/Other  Obesity   Renal/GU negative Renal ROS     Musculoskeletal negative musculoskeletal ROS (+)   Abdominal   Peds  Hematology  (+) Blood dyscrasia (Eliquis), anemia ,   Anesthesia Other Findings Day of surgery medications reviewed with the patient.  Reproductive/Obstetrics                                                            Anesthesia Evaluation  Patient identified by MRN, date of birth, ID  band Patient awake    Reviewed: Allergy & Precautions, NPO status , Patient's Chart, lab work & pertinent test results  Airway Mallampati: II  TM Distance: >3 FB Neck ROM: Full    Dental  (+) Dental Advisory Given, Upper Dentures   Pulmonary sleep apnea and Continuous Positive Airway Pressure Ventilation ,  H/o PE    Pulmonary exam normal breath sounds clear to auscultation       Cardiovascular Normal cardiovascular exam Rhythm:Regular Rate:Normal  Echo 03/26/2016: Study Conclusions   - Left ventricle: The cavity size was normal. Wall thickness was  increased in a pattern of mild LVH. The estimated ejection  fraction was 40%. Diffuse hypokinesis. Doppler parameters are  consistent with abnormal left ventricular relaxation (grade 1  diastolic dysfunction).  - Aortic valve: There was no stenosis.  - Mitral valve: There was no significant regurgitation.  - Right ventricle: The cavity size was normal. Systolic function  was normal.  - Pulmonary arteries: No complete TR doppler jet so unable to  estimate PA systolic pressure.  - Inferior vena cava: The vessel was normal in size. The  respirophasic diameter changes were in the normal range (>= 50%),  consistent with normal central venous pressure.    Neuro/Psych  Headaches, PSYCHIATRIC DISORDERS Bipolar Disorder Lumbar post laminectomy syndrome  Subdural hematoma 2015     GI/Hepatic negative GI ROS, Neg liver ROS,   Endo/Other  Obesity   Renal/GU negative Renal ROS     Musculoskeletal negative musculoskeletal ROS (+)   Abdominal   Peds  Hematology  (+) Blood dyscrasia (Eliquis), anemia ,   Anesthesia Other Findings Day of surgery medications reviewed with the patient.  Reproductive/Obstetrics                           Anesthesia Physical Anesthesia Plan  ASA: III  Anesthesia Plan: General   Post-op Pain Management:    Induction: Intravenous  PONV  Risk Score and Plan: 2 and Ondansetron, Dexamethasone and Midazolam  Airway Management Planned: Oral ETT  Additional Equipment:   Intra-op Plan:   Post-operative Plan: Extubation in OR  Informed Consent: I have reviewed the patients History and Physical, chart, labs and discussed the procedure including the risks, benefits and alternatives for the proposed anesthesia with the patient or authorized representative who has indicated his/her understanding and acceptance.     Dental advisory given  Plan Discussed with: CRNA and Anesthesiologist  Anesthesia Plan Comments:        Anesthesia Quick Evaluation  Anesthesia Physical  Anesthesia Plan  ASA: III  Anesthesia Plan: General   Post-op Pain Management:    Induction: Intravenous  PONV Risk Score and Plan: 2 and Ondansetron, Dexamethasone and Midazolam  Airway Management Planned: Oral ETT  Additional Equipment:   Intra-op Plan:   Post-operative Plan:   Informed Consent: I have reviewed the patients History and Physical, chart, labs and discussed the procedure including the risks, benefits and alternatives for the proposed anesthesia with the patient or authorized representative who has indicated his/her understanding and acceptance.     Dental advisory given  Plan Discussed with: CRNA  Anesthesia Plan Comments: (**CONFIRM LAST ELIQUIS DOSE!**)      Anesthesia Quick Evaluation

## 2019-05-18 ENCOUNTER — Ambulatory Visit (HOSPITAL_COMMUNITY)
Admission: RE | Admit: 2019-05-18 | Discharge: 2019-05-18 | Disposition: A | Discharge status: Home / Self Care | Payer: Medicaid Other | Source: Ambulatory Visit | Attending: Anesthesiology | Admitting: Anesthesiology

## 2019-05-18 ENCOUNTER — Ambulatory Visit (HOSPITAL_COMMUNITY): Payer: Medicaid Other

## 2019-05-18 ENCOUNTER — Encounter (HOSPITAL_COMMUNITY): Payer: Self-pay | Admitting: Anesthesiology

## 2019-05-18 ENCOUNTER — Other Ambulatory Visit: Payer: Self-pay

## 2019-05-18 ENCOUNTER — Encounter (HOSPITAL_COMMUNITY)
Admission: RE | Disposition: A | Discharge status: Home / Self Care | Payer: Self-pay | Source: Ambulatory Visit | Attending: Anesthesiology

## 2019-05-18 ENCOUNTER — Ambulatory Visit (HOSPITAL_COMMUNITY): Payer: Medicaid Other | Admitting: Anesthesiology

## 2019-05-18 DIAGNOSIS — Z7901 Long term (current) use of anticoagulants: Secondary | ICD-10-CM | POA: Diagnosis not present

## 2019-05-18 DIAGNOSIS — Z86711 Personal history of pulmonary embolism: Secondary | ICD-10-CM | POA: Diagnosis not present

## 2019-05-18 DIAGNOSIS — G4730 Sleep apnea, unspecified: Secondary | ICD-10-CM | POA: Diagnosis not present

## 2019-05-18 DIAGNOSIS — M5136 Other intervertebral disc degeneration, lumbar region: Secondary | ICD-10-CM | POA: Insufficient documentation

## 2019-05-18 DIAGNOSIS — M961 Postlaminectomy syndrome, not elsewhere classified: Secondary | ICD-10-CM | POA: Insufficient documentation

## 2019-05-18 DIAGNOSIS — F319 Bipolar disorder, unspecified: Secondary | ICD-10-CM | POA: Insufficient documentation

## 2019-05-18 DIAGNOSIS — Z79899 Other long term (current) drug therapy: Secondary | ICD-10-CM | POA: Diagnosis not present

## 2019-05-18 DIAGNOSIS — G894 Chronic pain syndrome: Secondary | ICD-10-CM | POA: Insufficient documentation

## 2019-05-18 DIAGNOSIS — Z419 Encounter for procedure for purposes other than remedying health state, unspecified: Secondary | ICD-10-CM

## 2019-05-18 HISTORY — PX: SPINAL CORD STIMULATOR INSERTION: SHX5378

## 2019-05-18 SURGERY — INSERTION, SPINAL CORD STIMULATOR, LUMBAR
Anesthesia: General | Site: Thoracic

## 2019-05-18 MED ORDER — PHENYLEPHRINE 40 MCG/ML (10ML) SYRINGE FOR IV PUSH (FOR BLOOD PRESSURE SUPPORT)
PREFILLED_SYRINGE | INTRAVENOUS | Status: DC | PRN
  Administered 2019-05-18: 200 ug via INTRAVENOUS
  Administered 2019-05-18: 80 ug via INTRAVENOUS

## 2019-05-18 MED ORDER — LIDOCAINE 2% (20 MG/ML) 5 ML SYRINGE
INTRAMUSCULAR | Status: AC
  Filled 2019-05-18: qty 5

## 2019-05-18 MED ORDER — EPHEDRINE SULFATE 50 MG/ML IJ SOLN
INTRAMUSCULAR | Status: DC | PRN
  Administered 2019-05-18: 5 mg via INTRAVENOUS

## 2019-05-18 MED ORDER — SUGAMMADEX SODIUM 200 MG/2ML IV SOLN
INTRAVENOUS | Status: DC | PRN
  Administered 2019-05-18: 400 mg via INTRAVENOUS

## 2019-05-18 MED ORDER — PROMETHAZINE HCL 25 MG/ML IJ SOLN: 6.2500 mg | INTRAMUSCULAR | Status: DC | PRN

## 2019-05-18 MED ORDER — CEPHALEXIN 500 MG PO CAPS: 500.0000 mg | ORAL_CAPSULE | Freq: Four times a day (QID) | ORAL | 0 refills | Status: DC

## 2019-05-18 MED ORDER — DEXAMETHASONE SODIUM PHOSPHATE 10 MG/ML IJ SOLN
INTRAMUSCULAR | Status: AC
  Filled 2019-05-18: qty 1

## 2019-05-18 MED ORDER — SODIUM CHLORIDE 0.9 % IV SOLN
INTRAVENOUS | Status: DC | PRN
  Administered 2019-05-18: 500 mL

## 2019-05-18 MED ORDER — FENTANYL CITRATE (PF) 100 MCG/2ML IJ SOLN: 25.0000 ug | INTRAMUSCULAR | Status: DC | PRN

## 2019-05-18 MED ORDER — PROPOFOL 10 MG/ML IV BOLUS
INTRAVENOUS | Status: AC
  Filled 2019-05-18: qty 20

## 2019-05-18 MED ORDER — CEFAZOLIN SODIUM-DEXTROSE 2-4 GM/100ML-% IV SOLN
2.0000 g | INTRAVENOUS | Status: AC
  Administered 2019-05-18: 2 g via INTRAVENOUS
  Filled 2019-05-18: qty 100

## 2019-05-18 MED ORDER — FENTANYL CITRATE (PF) 250 MCG/5ML IJ SOLN
INTRAMUSCULAR | Status: AC
  Filled 2019-05-18: qty 5

## 2019-05-18 MED ORDER — LIDOCAINE-EPINEPHRINE 0.5 %-1:200000 IJ SOLN
INTRAMUSCULAR | Status: DC | PRN
  Administered 2019-05-18: 20 mL

## 2019-05-18 MED ORDER — DEXAMETHASONE SODIUM PHOSPHATE 10 MG/ML IJ SOLN
INTRAMUSCULAR | Status: DC | PRN
  Administered 2019-05-18: 10 mg via INTRAVENOUS

## 2019-05-18 MED ORDER — MIDAZOLAM HCL 2 MG/2ML IJ SOLN
INTRAMUSCULAR | Status: AC
  Filled 2019-05-18: qty 2

## 2019-05-18 MED ORDER — PROPOFOL 10 MG/ML IV BOLUS
INTRAVENOUS | Status: DC | PRN
  Administered 2019-05-18: 200 mg via INTRAVENOUS

## 2019-05-18 MED ORDER — ONDANSETRON HCL 4 MG/2ML IJ SOLN
INTRAMUSCULAR | Status: AC
  Filled 2019-05-18: qty 2

## 2019-05-18 MED ORDER — OXYCODONE-ACETAMINOPHEN 10-325 MG PO TABS: 1.0000 | ORAL_TABLET | Freq: Four times a day (QID) | ORAL | 0 refills | Status: DC | PRN

## 2019-05-18 MED ORDER — PHENYLEPHRINE HCL-NACL 10-0.9 MG/250ML-% IV SOLN
INTRAVENOUS | Status: DC | PRN
  Administered 2019-05-18: 50 ug/min via INTRAVENOUS

## 2019-05-18 MED ORDER — LACTATED RINGERS IV SOLN: INTRAVENOUS | Status: DC | PRN

## 2019-05-18 MED ORDER — MIDAZOLAM HCL 5 MG/5ML IJ SOLN
INTRAMUSCULAR | Status: DC | PRN
  Administered 2019-05-18: 1 mg via INTRAVENOUS

## 2019-05-18 MED ORDER — PHENYLEPHRINE 40 MCG/ML (10ML) SYRINGE FOR IV PUSH (FOR BLOOD PRESSURE SUPPORT)
PREFILLED_SYRINGE | INTRAVENOUS | Status: AC
  Filled 2019-05-18: qty 10

## 2019-05-18 MED ORDER — CHLORHEXIDINE GLUCONATE CLOTH 2 % EX PADS: 6.0000 | MEDICATED_PAD | Freq: Once | CUTANEOUS | Status: DC

## 2019-05-18 MED ORDER — LIDOCAINE-EPINEPHRINE 0.5 %-1:200000 IJ SOLN
INTRAMUSCULAR | Status: AC
  Filled 2019-05-18: qty 1

## 2019-05-18 MED ORDER — 0.9 % SODIUM CHLORIDE (POUR BTL) OPTIME
TOPICAL | Status: DC | PRN
  Administered 2019-05-18: 1000 mL

## 2019-05-18 MED ORDER — FENTANYL CITRATE (PF) 100 MCG/2ML IJ SOLN
INTRAMUSCULAR | Status: DC | PRN
  Administered 2019-05-18: 50 ug via INTRAVENOUS

## 2019-05-18 MED ORDER — ROCURONIUM BROMIDE 50 MG/5ML IV SOSY
PREFILLED_SYRINGE | INTRAVENOUS | Status: DC | PRN
  Administered 2019-05-18: 50 mg via INTRAVENOUS

## 2019-05-18 MED ORDER — ACETAMINOPHEN 500 MG PO TABS
1000.0000 mg | ORAL_TABLET | Freq: Once | ORAL | Status: AC
  Administered 2019-05-18: 1000 mg via ORAL
  Filled 2019-05-18: qty 2

## 2019-05-18 MED ORDER — ROCURONIUM BROMIDE 10 MG/ML (PF) SYRINGE
PREFILLED_SYRINGE | INTRAVENOUS | Status: AC
  Filled 2019-05-18: qty 10

## 2019-05-18 MED ORDER — ONDANSETRON HCL 4 MG/2ML IJ SOLN
INTRAMUSCULAR | Status: DC | PRN
  Administered 2019-05-18: 4 mg via INTRAVENOUS

## 2019-05-18 MED ORDER — SUCCINYLCHOLINE CHLORIDE 200 MG/10ML IV SOSY
PREFILLED_SYRINGE | INTRAVENOUS | Status: AC
  Filled 2019-05-18: qty 10

## 2019-05-18 MED ORDER — LIDOCAINE 2% (20 MG/ML) 5 ML SYRINGE
INTRAMUSCULAR | Status: DC | PRN
  Administered 2019-05-18: 100 mg via INTRAVENOUS

## 2019-05-18 SURGICAL SUPPLY — 64 items
ANCHOR CLIK X NEURO (Stimulator) ×3 IMPLANT
BAG DECANTER FOR FLEXI CONT (MISCELLANEOUS) ×3 IMPLANT
BENZOIN TINCTURE PRP APPL 2/3 (GAUZE/BANDAGES/DRESSINGS) IMPLANT
BINDER ABDOMINAL 12 ML 46-62 (SOFTGOODS) ×3 IMPLANT
BLADE CLIPPER SURG (BLADE) IMPLANT
CHLORAPREP W/TINT 26 (MISCELLANEOUS) ×3 IMPLANT
CLIP VESOCCLUDE SM WIDE 6/CT (CLIP) IMPLANT
CLOSURE WOUND 1/2 X4 (GAUZE/BANDAGES/DRESSINGS)
COVER WAND RF STERILE (DRAPES) IMPLANT
DERMABOND ADVANCED (GAUZE/BANDAGES/DRESSINGS) ×2
DERMABOND ADVANCED .7 DNX12 (GAUZE/BANDAGES/DRESSINGS) ×1 IMPLANT
DRAPE C-ARM 42X72 X-RAY (DRAPES) ×3 IMPLANT
DRAPE C-ARMOR (DRAPES) ×3 IMPLANT
DRAPE LAPAROTOMY 100X72X124 (DRAPES) ×3 IMPLANT
DRAPE SURG 17X23 STRL (DRAPES) ×3 IMPLANT
DRSG OPSITE POSTOP 3X4 (GAUZE/BANDAGES/DRESSINGS) IMPLANT
DRSG OPSITE POSTOP 4X6 (GAUZE/BANDAGES/DRESSINGS) ×6 IMPLANT
ELECT REM PT RETURN 9FT ADLT (ELECTROSURGICAL) ×3
ELECTRODE REM PT RTRN 9FT ADLT (ELECTROSURGICAL) ×1 IMPLANT
GAUZE 4X4 16PLY RFD (DISPOSABLE) IMPLANT
GENERATOR NEUROSTIMULATOR (Neurostimulator) ×3 IMPLANT
GLOVE BIOGEL PI IND STRL 7.5 (GLOVE) ×1 IMPLANT
GLOVE BIOGEL PI IND STRL 8 (GLOVE) ×2 IMPLANT
GLOVE BIOGEL PI INDICATOR 7.5 (GLOVE) ×2
GLOVE BIOGEL PI INDICATOR 8 (GLOVE) ×4
GLOVE ECLIPSE 7.5 STRL STRAW (GLOVE) ×3 IMPLANT
GLOVE EXAM NITRILE LRG STRL (GLOVE) IMPLANT
GLOVE EXAM NITRILE XL STR (GLOVE) IMPLANT
GLOVE EXAM NITRILE XS STR PU (GLOVE) IMPLANT
GOWN STRL REUS W/ TWL LRG LVL3 (GOWN DISPOSABLE) ×1 IMPLANT
GOWN STRL REUS W/ TWL XL LVL3 (GOWN DISPOSABLE) IMPLANT
GOWN STRL REUS W/TWL 2XL LVL3 (GOWN DISPOSABLE) ×6 IMPLANT
GOWN STRL REUS W/TWL LRG LVL3 (GOWN DISPOSABLE) ×2
GOWN STRL REUS W/TWL XL LVL3 (GOWN DISPOSABLE)
KIT BASIN OR (CUSTOM PROCEDURE TRAY) ×3 IMPLANT
KIT CHARGING (KITS) ×2
KIT CHARGING PRECISION NEURO (KITS) ×1 IMPLANT
KIT REMOTE CONTROL 112802 FREE (KITS) ×3 IMPLANT
KIT TURNOVER KIT B (KITS) ×3 IMPLANT
LEAD INFINION CX PERC 70CM (Lead) ×6 IMPLANT
NEEDLE 18GX1X1/2 (RX/OR ONLY) (NEEDLE) IMPLANT
NEEDLE ENTRADA 4.5IN (NEEDLE) ×6 IMPLANT
NEEDLE HYPO 25X1 1.5 SAFETY (NEEDLE) ×3 IMPLANT
NS IRRIG 1000ML POUR BTL (IV SOLUTION) ×3 IMPLANT
PACK LAMINECTOMY NEURO (CUSTOM PROCEDURE TRAY) ×3 IMPLANT
PAD ARMBOARD 7.5X6 YLW CONV (MISCELLANEOUS) ×18 IMPLANT
SPONGE LAP 4X18 RFD (DISPOSABLE) IMPLANT
SPONGE SURGIFOAM ABS GEL SZ50 (HEMOSTASIS) IMPLANT
STAPLER SKIN PROX WIDE 3.9 (STAPLE) ×3 IMPLANT
STRIP CLOSURE SKIN 1/2X4 (GAUZE/BANDAGES/DRESSINGS) IMPLANT
SUT MNCRL AB 3-0 PS2 18 (SUTURE) ×3 IMPLANT
SUT MNCRL AB 4-0 PS2 18 (SUTURE) IMPLANT
SUT SILK 0 (SUTURE) ×2
SUT SILK 0 MO-6 18XCR BRD 8 (SUTURE) ×1 IMPLANT
SUT SILK 0 TIES 10X30 (SUTURE) IMPLANT
SUT SILK 2 0 TIES 10X30 (SUTURE) IMPLANT
SUT VIC AB 2-0 CP2 18 (SUTURE) ×9 IMPLANT
SYR 10ML LL (SYRINGE) IMPLANT
SYR EPIDURAL 5ML GLASS (SYRINGE) ×3 IMPLANT
TOOL LONG TUNNEL (SPINAL CORD STIMULATOR) ×3 IMPLANT
TOWEL GREEN STERILE (TOWEL DISPOSABLE) ×3 IMPLANT
TOWEL GREEN STERILE FF (TOWEL DISPOSABLE) ×3 IMPLANT
WATER STERILE IRR 1000ML POUR (IV SOLUTION) ×3 IMPLANT
YANKAUER SUCT BULB TIP NO VENT (SUCTIONS) ×3 IMPLANT

## 2019-05-18 NOTE — OR Nursing (Signed)
Once patient reached OR room he stated he had to urinate. Urinal obtained and patient voided  500 cc.Cliff Damiani RN

## 2019-05-18 NOTE — H&P (Signed)
Justin [name] is an 54 y.o. male.   Chief Complaint: back and leg pain HPI: 54 y/o with long term back pain and radiculopathy; has multilevel degenerative disc degeneration without critical impingement and has had 2 or more surgical opinions that found him to not be a surgical candidate. He has had limited benefit (if any) from various intervention and medication management trials. SCS was trialed after psychological evaluation found no impediment and he reported >50% relief of his symptoms. He now presents for permanent implantation  Past Medical History:  Diagnosis Date  . Achilles rupture, left   . Bipolar 1 disorder (HCC)   . Dental caries    periodontitis  . Pneumonia   . Pulmonary embolism (HCC)   . Sleep apnea    wears CPAP  . Subdural hematoma (HCC) 2015    Past Surgical History:  Procedure Laterality Date  . ACHILLES TENDON SURGERY Left 10/21/2014   Procedure: Left Achilles Reconstruction;  Surgeon: Nadara Mustard, MD;  Location[facility] OR;  Service: Orthopedics;  Laterality: Left;  . APPENDECTOMY    . CRANIOTOMY N/A 01/19/2013   Procedure: CRANIOTOMY HEMATOMA EVACUATION SUBDURAL;  Surgeon: Hewitt Shorts, MD;  Location: [facility] ORS;  Service: Neurosurgery;  Laterality: N/A;  . CYSTECTOMY     right head  . ELBOW SURGERY     right  . FRACTURE SURGERY     finger  . I & D EXTREMITY Left 12/07/2016   Procedure: LEFT ACHILLES DEBRIDEMENT;  Surgeon: Nadara Mustard, MD;  Location: [facility] OR;  Service: Orthopedics;  Laterality: Left;  . LUMBAR FUSION  12/23/2017   L5 GILL PROCEDURE, RIGHT L5-S1, TRANSFORAMIAL LUMBAR INTERBODY FUSION, BILATERAL LATERAL FUSION, PEDICLE INSTRUMENTATION  . MULTIPLE EXTRACTIONS WITH ALVEOLOPLASTY N/A 03/07/2015   Procedure: MULTIPLE EXTRACTION WITH ALVEOLOPLASTY;  Surgeon: [name], DDS;  Location: [facility] OR;  Service: Oral Surgery;  Laterality: N/A;    Family History  Problem Relation Age of Onset  . Hypertension Mother   . Stroke Mother   . Multiple  sclerosis Sister   . Down syndrome Son    Social History:  reports that he has never smoked. He has never used smokeless tobacco. He reports that he does not drink alcohol or use drugs.  Allergies: No Known Allergies  Medications Prior to Admission  Medication Sig Dispense Refill  . doxepin (SINEQUAN) 10 MG capsule Take 20 mg by mouth at bedtime.   1  . hydrOXYzine (ATARAX/VISTARIL) 25 MG tablet Take 25 mg by mouth as needed for anxiety.     . lidocaine (LIDODERM) 5 % Place 1 patch onto the skin daily. Remove & Discard patch within 12 hours or as directed by MD 30 patch 0  . lithium 600 MG capsule Take 600 mg by mouth at bedtime.    . mirtazapine (REMERON) 45 MG tablet Take 45 mg by mouth at bedtime.    . predniSONE (DELTASONE) 20 MG tablet 3 tabs po day one, then 2 tabs daily x 4 days 11 tablet 0  . QUEtiapine (SEROQUEL XR) 400 MG 24 hr tablet Take 800 mg by mouth at bedtime.    Marland Kitchen apixaban (ELIQUIS) 5 MG TABS tablet Take 1 tablet (5 mg total) by mouth 2 (two) times daily. 60 tablet 0  . diclofenac sodium (VOLTAREN) 1 % GEL Apply 2 g topically 4 (four) times daily. (Patient not taking: Reported on 05/05/2019) 100 g 0  . methocarbamol (ROBAXIN) 500 MG tablet Take 1 tablet (500 mg total) by mouth  2 (two) times daily. (Patient not taking: Reported on 05/05/2019) 14 tablet 0  . naproxen (NAPROSYN) 500 MG tablet Take 1 tablet (500 mg total) by mouth 2 (two) times daily with a meal. (Patient not taking: Reported on 07/24/2018) 30 tablet 0    No results found for this or any previous visit (from the past 48 hour(s)). No results found.  Review of Systems  Constitutional: Negative.   HENT: Negative.   Eyes: Negative.   Respiratory: Negative.   Cardiovascular: Negative.   Gastrointestinal: Negative.   Endocrine: Negative.   Genitourinary: Negative.   Musculoskeletal: Positive for back pain. Negative for arthralgias, gait problem, joint swelling and neck pain.  Skin: Negative.    Allergic/Immunologic: Negative.   Neurological: Negative.   Hematological: Negative.   Psychiatric/Behavioral: Negative.     Blood pressure 115/86, pulse 83, temperature 98.7 F (37.1 C), temperature source Oral, resp. rate 18, height 5\' 10"  (1.778 m), weight 107 kg, SpO2 95 %. Physical Exam  Constitutional: He is oriented to person, place, and time. He appears well-developed and well-nourished.  HENT:  Head: Normocephalic and atraumatic.  Eyes: Pupils are equal, round, and reactive to light.  Cardiovascular: Normal rate.  Respiratory: Effort normal.  Musculoskeletal:        General: Normal range of motion.     Cervical back: Normal range of motion.  Neurological: He is alert and oriented to person, place, and time.  Skin: Skin is warm and dry.  Psychiatric: He has a normal mood and affect. His behavior is normal. Judgment normal.     Assessment/Plan Lumbar degenerative disc disease Chronic pain syndrome PLAN: SCS implant, [employer]   Bonna Gains, MD 05/18/2019, 7:05 AM

## 2019-05-18 NOTE — Op Note (Signed)
PREOP DX: 1) lumbar post-laminectomy syndrome; 2)chronic pain syndrome  POSTOP ZO:XWRU as preop PROCEDURES PERFORMED:1) intraop fluoro 2) placement of 2 16 contact [employer] Infinion leads 3) placement of SpectraWavewriterSCS generator 4) post op complex SCS programming SURGEON:Lenita Peregrina  ASSISTANT: NONE  ANESTHESIA:GETA EBL: <20cc  DESCRIPTION OF PROCEDURE: After a discussion of risks, benefits and alternatives, informed consent was obtained. The patient was taken to the OR,general anesthesia induced,turned prone onto a Jackson table, all pressure points padded, SCD's placed. A timeout was taken to verify the correct patient, position, personnel, availability of appropriate equipment, and administration of perioperative antibiotics.  The thoracic and lumbar areas were widely prepped with chloraprep and draped into a sterile field. Fluoroscopy was used to plan aRIGHTparamedian incision at theL1-L3 levels, and an incision made with a 10 blade and carried down to the dorsolumbar fascia with the bovie and blunt dissection. Retractors were placed and a 14g [employer] tuohy needle placed into the epidural space at theT12-L1interspace using biplanar fluoro and loss-of-resistance technique. The needle was aspirated without any return of fluid. A [employer] INFINION lead was introduced and under live AP fluoro advanced until the distal-most2 contactsoverlay thesuperioraspectof theT7vertebral body shadow with the rest of the contacts distributed over [employer] in a position just right of anatomic midline. A second Infinion lead was placed just left of anatomic midlineusing the same technique. 0 silk sutures were placed in the fascia adjacent to the needles. The needles and stylets were removed under fluoroscopy with no lead migration noted. Leads were then fixed to the fascia with cliK anchors. Repeat images were obtained to verify that there had been  no lead migration. The incision was inspected and hemostasis obtained with the bipolar cautery.    Attention was then turned to creation of a subcutaneous pocket. At therightflank/buttock, a 3 cm incision was made with a 10 blade and using the bovie and blunt dissection a pocket of size appropriate to place a SCS generator. The pocket was trialed, and found to be of adequate size. The pocket was inspected for hemostasis, which was found to be excellent. Using reverse seldinger technique, the leads were tunneled to the pocket site, and the leads inserted into the SCS generator. Impedances were checked, and all found to be excellent. The leads were then all fixed into position with a self-torquing wrench. The wiring was all carefully coiled, placed behind the generator and placed in the pocket.  Both incisions were copiously irrigated with bacitracin-containing irrigation. The lumbar incision was closed in3deep layers of interrupted 2-0 vicryl and the skin closed a running 3-0 monocryl subcuticular suture and dermabond.The pocket incision was closed with a deeper layer of 2-0 vicryl interrupted sutures, and the skin closed a running 3-0 monocryl subcuticular suture and dermabond.Sterile dressings were applied.   Needle, sponge, and instrument counts were correct x2 at the end of the case.    The patient was then carefully awakened from anesthesia, turned supine, an abdominal binder placed, and the patient taken to the recovery room wherehe underwent complex spinal cord stimulator programming.  COMPLICATIONS: NONE  CONDITION: Stable throughout the course of the procedure and immediately afterward  DISPOSITION:anticipatedischarge to home, with antibiotics and pain medicine. Discussed care with the patientandfamily. Followup in clinic will be scheduled in 10-14 days.

## 2019-05-18 NOTE — Transfer of Care (Signed)
Immediate Anesthesia Transfer of Care Note  Patient: Justin [name]  Procedure(s) Performed: LUMBAR SPINAL CORD STIMULATOR INSERTION (N/A Thoracic)  Patient Location: PACU  Anesthesia Type:General  Level of Consciousness: awake, alert  and oriented  Airway & Oxygen Therapy: Patient connected to face mask oxygen  Post-op Assessment: Post -op Vital signs reviewed and stable  Post vital signs: stable  Last Vitals:  Vitals Value Taken Time  BP 129/88 05/18/19 0910  Temp    Pulse 91 05/18/19 0911  Resp 38 05/18/19 0911  SpO2 99 % 05/18/19 0911  Vitals shown include unvalidated device data.  Last Pain:  Vitals:   05/18/19 0557  TempSrc:   PainSc: 0-No pain      Patients Stated Pain Goal: 2 (05/18/19 0557)  Complications: No apparent anesthesia complications

## 2019-05-18 NOTE — Anesthesia Procedure Notes (Signed)
Procedure Name: Intubation Date/Time: 05/18/2019 7:34 AM Performed by: Lavell Luster, CRNA Pre-anesthesia Checklist: Patient identified, Emergency Drugs available, Suction available, Patient being monitored and Timeout performed Patient Re-evaluated:Patient Re-evaluated prior to induction Oxygen Delivery Method: Circle system utilized Preoxygenation: Pre-oxygenation with 100% oxygen Induction Type: IV induction Ventilation: Mask ventilation without difficulty Laryngoscope Size: Mac and 4 Grade View: Grade I Tube type: Oral Tube size: 7.5 mm Number of attempts: 1 Airway Equipment and Method: Stylet Placement Confirmation: ETT inserted through vocal cords under direct vision,  positive ETCO2 and breath sounds checked- equal and bilateral Secured at: 23 cm Dental Injury: Teeth and Oropharynx as per pre-operative assessment

## 2019-05-18 NOTE — Anesthesia Postprocedure Evaluation (Signed)
Anesthesia Post Note  Patient: Justin [name]  Procedure(s) Performed: LUMBAR SPINAL CORD STIMULATOR INSERTION (N/A Thoracic)     Patient location during evaluation: PACU Anesthesia Type: General Level of consciousness: awake and alert Pain management: pain level controlled Vital Signs Assessment: post-procedure vital signs reviewed and stable Respiratory status: spontaneous breathing, nonlabored ventilation and respiratory function stable Cardiovascular status: blood pressure returned to baseline and stable Postop Assessment: no apparent nausea or vomiting Anesthetic complications: no    Last Vitals:  Vitals:   05/18/19 0925 05/18/19 0930  BP: (!) 131/92 (!) 129/93  Pulse: 91 84  Resp: 17 19  Temp:  36.5 C  SpO2: 95% 97%    Last Pain:  Vitals:   05/18/19 0930  TempSrc:   PainSc: 0-No pain                 Cecile Hearing

## 2019-05-18 NOTE — Discharge Instructions (Addendum)
Dr. Ollen Bowl Post-Op Orders  . Ice Pack - 20 minutes on (in a pillow case), and 20 minutes off. Wear the ice pack UNDER the binder. . Follow up in office, they will call you for an appointment in 10 days to 2 weeks. . Increase activity gradually.   . No lifting anything heavier than a gallon of milk (10 pounds) until seen in the office. . Advance diet slowly as tolerated. . Dressing care:  Keep dressing dry for 3 days; on 05/21/2019 ok to remove dressings and shower;  Marland Kitchen Call for fever, drainage, and redness. . No swimming or bathing in a bathtub (do not get into standing water). RESTART ELIQUIS 05/21/2019, but not before

## 2019-05-19 ENCOUNTER — Encounter: Payer: Self-pay | Admitting: *Deleted

## 2019-06-11 ENCOUNTER — Encounter (HOSPITAL_COMMUNITY): Payer: Self-pay | Admitting: Emergency Medicine

## 2019-06-11 ENCOUNTER — Emergency Department (HOSPITAL_COMMUNITY)
Admission: EM | Admit: 2019-06-11 | Discharge: 2019-06-11 | Disposition: A | Discharge status: Home / Self Care | Payer: Medicaid Other | Attending: Emergency Medicine | Admitting: Emergency Medicine

## 2019-06-11 DIAGNOSIS — Z9889 Other specified postprocedural states: Secondary | ICD-10-CM

## 2019-06-11 DIAGNOSIS — Z9682 Presence of neurostimulator: Secondary | ICD-10-CM | POA: Insufficient documentation

## 2019-06-11 DIAGNOSIS — M5489 Other dorsalgia: Secondary | ICD-10-CM | POA: Diagnosis not present

## 2019-06-11 DIAGNOSIS — M549 Dorsalgia, unspecified: Secondary | ICD-10-CM

## 2019-06-11 DIAGNOSIS — Z79899 Other long term (current) drug therapy: Secondary | ICD-10-CM | POA: Insufficient documentation

## 2019-06-11 MED ORDER — LIDOCAINE 5 % EX PTCH
1.0000 | MEDICATED_PATCH | CUTANEOUS | Status: DC
  Administered 2019-06-11: 23:00:00 1 via TRANSDERMAL
  Filled 2019-06-11: qty 1

## 2019-06-11 MED ORDER — LIDOCAINE 5 % EX PTCH: 1.0000 | MEDICATED_PATCH | CUTANEOUS | 0 refills | Status: DC

## 2019-06-11 NOTE — Discharge Instructions (Addendum)
You have been diagnosed today with Back Pain.  At this time there does not appear to be the presence of an emergent medical condition, however there is always the potential for conditions to change. Please read and follow the below instructions.  Please return to the Emergency Department immediately for any new or worsening symptoms. Please be sure to follow up with your Primary Care Provider within one week regarding your visit today; please call their office to schedule an appointment even if you are feeling better for a follow-up visit. You may use the Lidoderm patches prescribed to you today to help with your pain.  Also use ice packs to help with pain with your back.  Please call your surgeon's office tomorrow morning to schedule an appointment.  Dr. Ollen Bowl would like to see you on either Monday or Tuesday of the coming week for reassessment.  Get help right away if: You develop new bowel or bladder control problems. You have unusual weakness or numbness in your arms or legs. You develop nausea or vomiting. You develop abdominal pain. You feel faint. You have redness or swelling of your skin You have drainage or pain of your scars You have any new/concerning or worsening of symptoms  Please read the additional information packets attached to your discharge summary.  Do not take your medicine if  develop an itchy rash, swelling in your mouth or lips, or difficulty breathing; call 911 and seek immediate emergency medical attention if this occurs.  Note: Portions of this text may have been transcribed using voice recognition software. Every effort was made to ensure accuracy; however, inadvertent computerized transcription errors may still be present.

## 2019-06-11 NOTE — ED Triage Notes (Signed)
Pt states on March 1st pt had stimulator put in back due to chronic pain about one week ago pt began to have pain around the site. Pt has no redness , swelling or drainage from site.

## 2019-06-11 NOTE — ED Provider Notes (Signed)
[facility] EMERGENCY DEPARTMENT Provider Note   CSN: [id number] Arrival date & time: 06/11/19  1634     History Chief Complaint  Patient presents with  . Back Pain    Justin [name] is a 54 y.o. male presents today for pain around spinal stimulator.  Patient reports he has a long history of chronic back pain, he had spinal stimulator placed by Dr. Ollen Bowl on 05/18/2019.  He reports that he had been feeling well and back pain greatly improved post procedure.  He reports that over the last 5-7 days he has developed some right lumbar pain around the "battery pack" for his spinal stimulator.  He describes a mild prickly tingling sensation constant worsened with sitting back in a chair and touching the area improved with standing, pain is nonradiating.  He has not called his surgeon yet to discuss this problem, he does not have a follow-up for another month per patient.  Denies fever/chills, chest pain/shortness breath, abdominal pain, numbness/tingling, weakness, saddle or paresthesias, bowel/bladder incontinence, urinary retention or any additional concerns.  HPI     Past Medical History:  Diagnosis Date  . Achilles rupture, left   . Bipolar 1 disorder (HCC)   . Dental caries    periodontitis  . Pneumonia   . Pulmonary embolism (HCC)   . Sleep apnea    wears CPAP  . Subdural hematoma (HCC) 2015    Patient Active Problem List   Diagnosis Date Noted  . Status post lumbar spinal fusion 01/03/2018  . Lumbar stenosis 12/23/2017  . Spondylolisthesis, lumbar region 06/25/2017  . Achilles tendinitis of left lower extremity 12/07/2016  . Achilles tendinitis, left leg 05/17/2016  . Obstructive sleep apnea on CPAP 03/07/2015  . Post-operative state 03/07/2015  . Achilles rupture, left 10/21/2014  . OSA (obstructive sleep apnea) 04/23/2013  . Bipolar disorder, unspecified (HCC) 01/07/2013  . Sinus tachycardia 01/07/2013  . Headache(784.0) 01/07/2013  . SDH (subdural  hematoma) (HCC) 01/04/2013  . H/O PE 01/04/2013  . Chronic anticoagulation 01/04/2013  . 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  . Acute sinusitis 01/04/2013    Past Surgical History:  Procedure Laterality Date  . ACHILLES TENDON SURGERY Left 10/21/2014   Procedure: Left Achilles Reconstruction;  Surgeon: Nadara Mustard, MD;  Location: [facility] OR;  Service: Orthopedics;  Laterality: Left;  . APPENDECTOMY    . CRANIOTOMY N/A 01/19/2013   Procedure: CRANIOTOMY HEMATOMA EVACUATION SUBDURAL;  Surgeon: Hewitt Shorts, MD;  Location: [facility];  Service: Neurosurgery;  Laterality: N/A;  . CYSTECTOMY     right head  . ELBOW SURGERY     right  . FRACTURE SURGERY     finger  . I & D EXTREMITY Left 12/07/2016   Procedure: LEFT ACHILLES DEBRIDEMENT;  Surgeon: Nadara Mustard, MD;  Location: [facility] OR;  Service: Orthopedics;  Laterality: Left;  . LUMBAR FUSION  12/23/2017   L5 GILL PROCEDURE, RIGHT L5-S1, TRANSFORAMIAL LUMBAR INTERBODY FUSION, BILATERAL LATERAL FUSION, PEDICLE INSTRUMENTATION  . MULTIPLE EXTRACTIONS WITH ALVEOLOPLASTY N/A 03/07/2015   Procedure: MULTIPLE EXTRACTION WITH ALVEOLOPLASTY;  Surgeon: [name], DDS;  Location: [facility] OR;  Service: Oral Surgery;  Laterality: N/A;  . SPINAL CORD STIMULATOR INSERTION N/A 05/18/2019   Procedure: LUMBAR SPINAL CORD STIMULATOR INSERTION;  Surgeon: Odette Fraction, MD;  Location: [facility] OR;  Service: Neurosurgery;  Laterality: N/A;  Thoracic/Lumbar       Family History  Problem Relation Age of Onset  . Hypertension Mother   . Stroke Mother   .  Multiple sclerosis Sister   . Down syndrome Son     Social History   Tobacco Use  . Smoking status: Never Smoker  . Smokeless tobacco: Never Used  Substance Use Topics  . Alcohol use: No  . Drug use: No    Home Medications Prior to Admission medications   Medication Sig Start Date End Date Taking? Authorizing Provider  cephALEXin (KEFLEX) 500 MG capsule Take 1 capsule (500 mg total) by mouth  4 (four) times daily. 05/18/19   Clydell Hakim, MD  diclofenac sodium (VOLTAREN) 1 % GEL Apply 2 g topically 4 (four) times daily. Patient not taking: Reported on 05/05/2019 01/02/19   Domenic Moras, PA-C  doxepin (SINEQUAN) 10 MG capsule Take 20 mg by mouth at bedtime.  07/15/17   [provider]  hydrOXYzine (ATARAX/VISTARIL) 25 MG tablet Take 25 mg by mouth as needed for anxiety.  09/06/18   [provider]  lidocaine (LIDODERM) 5 % Place 1 patch onto the skin daily. Remove & Discard patch within 12 hours or as directed by MD 08/09/18   Nuala Alpha A, PA-C  lidocaine (LIDODERM) 5 % Place 1 patch onto the skin daily. Remove & Discard patch within 12 hours or as directed by MD 06/11/19   Nuala Alpha A, PA-C  lithium 600 MG capsule Take 600 mg by mouth at bedtime.    [provider]  methocarbamol (ROBAXIN) 500 MG tablet Take 1 tablet (500 mg total) by mouth 2 (two) times daily. Patient not taking: Reported on 05/05/2019 08/09/18   Nuala Alpha A, PA-C  mirtazapine (REMERON) 45 MG tablet Take 45 mg by mouth at bedtime.    [provider]  naproxen (NAPROSYN) 500 MG tablet Take 1 tablet (500 mg total) by mouth 2 (two) times daily with a meal. Patient not taking: Reported on 07/24/2018 06/26/18   Caccavale, Sophia, PA-C  oxyCODONE-acetaminophen (PERCOCET) 10-325 MG tablet Take 1 tablet by mouth every 6 (six) hours as needed for pain. 05/18/19 05/17/20  Clydell Hakim, MD  predniSONE (DELTASONE) 20 MG tablet 3 tabs po day one, then 2 tabs daily x 4 days 01/02/19   Domenic Moras, PA-C  QUEtiapine (SEROQUEL XR) 400 MG 24 hr tablet Take 800 mg by mouth at bedtime.    [provider]    Allergies    Patient has no known allergies.  Review of Systems   Review of Systems  Constitutional: Negative for chills and fever.  Gastrointestinal: Negative for abdominal pain, nausea and vomiting.  Musculoskeletal: Positive for back pain. Negative for neck pain.    Neurological: Negative for weakness and numbness.       Denies bowel/bladder incontinence, denies urinary retention, denies saddle area paresthesias    Physical Exam Updated Vital Signs BP (!) 147/93 (BP Location: Left Arm)   Pulse 86   Temp 98.5 F (36.9 C) (Oral)   Resp (!) 22   SpO2 99%   Physical Exam Constitutional:      General: He is not in acute distress.    Appearance: Normal appearance. He is well-developed. He is not ill-appearing or diaphoretic.  HENT:     Head: Normocephalic and atraumatic.     Right Ear: External ear normal.     Left Ear: External ear normal.     Nose: Nose normal.  Eyes:     General: Vision grossly intact. Gaze aligned appropriately.     Pupils: Pupils are equal, round, and reactive to light.  Neck:  Trachea: Trachea and phonation normal. No tracheal deviation.  Pulmonary:     Effort: Pulmonary effort is normal. No respiratory distress.  Abdominal:     General: There is no distension.     Palpations: Abdomen is soft.     Tenderness: There is no abdominal tenderness. There is no guarding or rebound.  Musculoskeletal:        General: Normal range of motion.     Cervical back: Normal range of motion.       Back:     Comments: No midline C/T/L spinal tenderness to palpation, no deformity, crepitus, or step-off noted. - Multiple well healed surgical scars present no evidence of dehiscence or drainage.  No erythema, fluctuance or induration.  Palpable device present to the right lumbar area beneath the skin, minimally tender to palpation.  Overlying skin appears normal.  Skin:    General: Skin is warm and dry.  Neurological:     Mental Status: He is alert.     GCS: GCS eye subscore is 4. GCS verbal subscore is 5. GCS motor subscore is 6.     Comments: Speech is clear and goal oriented, follows commands Major Cranial nerves without deficit, no facial droop Moves extremities without ataxia, coordination intact  Psychiatric:         Behavior: Behavior normal.     ED Results / Procedures / Treatments   Labs (all labs ordered are listed, but only abnormal results are displayed) Labs Reviewed - No data to display  EKG None  Radiology No results found.  Procedures Procedures (including critical care time)  Medications Ordered in ED Medications  lidocaine (LIDODERM) 5 % 1 patch (has no administration in time range)    ED Course  I have reviewed the triage vital signs and the nursing notes.  Pertinent labs & imaging results that were available during my care of the patient were reviewed by me and considered in my medical decision making (see chart for details).  Clinical Course as of Jun 11 2223  Thu Jun 11, 2019  2152 Dr. Ollen Bowl   [BM]  2152 Ice pack; lidocaine patch   [BM]    Clinical Course User Index [BM] Elizabeth Palau   MDM Rules/Calculators/A&P                      54 year old male presents today with pain around the battery pack of his spinal stimulator that was implanted 25 days ago.  Surgical scars are well-healing. Overlying skin appears normal without evidence of infection.  He has no neurologic complaint, no midline spinal tenderness, additionally he reports that his back pain is greatly improved since receiving a spinal stimulator.  Patient's pain has been bothering him for around 1 week no and is generally only present whenever something is touching the skin overlying the battery pack. - 9:27 PM: Received call from neurosurgeon Dr. Danielle Dess who advises that Dr. Ollen Bowl will take call. - 9:52 PM: Discussed case with neurosurgeon Dr. Ollen Bowl who advises that patient use ice packs and Lidoderm patches to help with pain.  He asked that patient call office to schedule appointment, he would like to see patient at the beginning of the coming week for reassessment.  Patient reassessed standing by door requesting discharge.  He states understanding of care plan and is agreeable.  He has no  further questions or concerns. - At this time there does not appear to be any evidence of an acute emergency medical  condition and the patient appears stable for discharge with appropriate outpatient follow up. Diagnosis was discussed with patient who verbalizes understanding of care plan and is agreeable to discharge. I have discussed return precautions with patient who verbalizes understanding of return precautions. Patient encouraged to follow-up with their PCP and neurosurgery. All questions answered.  Note: Portions of this report may have been transcribed using voice recognition software. Every effort was made to ensure accuracy; however, inadvertent computerized transcription errors may still be present. Final Clinical Impression(s) / ED Diagnoses Final diagnoses:  Back pain with history of spinal surgery    Rx / DC Orders ED Discharge Orders         Ordered    lidocaine (LIDODERM) 5 %  Every 24 hours     06/11/19 2223           Elizabeth Palau 06/11/19 2225    Vanetta Mulders, MD 06/24/19 [phone]

## 2019-07-08 ENCOUNTER — Emergency Department (HOSPITAL_COMMUNITY)
Admission: EM | Admit: 2019-07-08 | Discharge: 2019-07-08 | Disposition: A | Discharge status: Home / Self Care | Payer: Medicaid Other | Attending: Emergency Medicine | Admitting: Emergency Medicine

## 2019-07-08 ENCOUNTER — Emergency Department (HOSPITAL_BASED_OUTPATIENT_CLINIC_OR_DEPARTMENT_OTHER): Payer: Medicaid Other

## 2019-07-08 DIAGNOSIS — Z79899 Other long term (current) drug therapy: Secondary | ICD-10-CM | POA: Insufficient documentation

## 2019-07-08 DIAGNOSIS — F319 Bipolar disorder, unspecified: Secondary | ICD-10-CM | POA: Diagnosis not present

## 2019-07-08 DIAGNOSIS — Z86718 Personal history of other venous thrombosis and embolism: Secondary | ICD-10-CM | POA: Diagnosis not present

## 2019-07-08 DIAGNOSIS — Z86711 Personal history of pulmonary embolism: Secondary | ICD-10-CM | POA: Diagnosis not present

## 2019-07-08 DIAGNOSIS — M7989 Other specified soft tissue disorders: Secondary | ICD-10-CM

## 2019-07-08 DIAGNOSIS — M79604 Pain in right leg: Secondary | ICD-10-CM | POA: Diagnosis not present

## 2019-07-08 MED ORDER — NAPROXEN 500 MG PO TABS: 500.0000 mg | ORAL_TABLET | Freq: Two times a day (BID) | ORAL | 0 refills | Status: AC

## 2019-07-08 MED ORDER — NAPROXEN 250 MG PO TABS
500.0000 mg | ORAL_TABLET | Freq: Once | ORAL | Status: AC
  Administered 2019-07-08: 500 mg via ORAL
  Filled 2019-07-08: qty 2

## 2019-07-08 NOTE — Discharge Instructions (Signed)
You are seen today for leg pain.  Your ultrasound showed that there is no blood clot.  I think you may have strained your knee and your ankle.  We are starting you on some medications which should help with this.  You can ice these areas daily.  Follow-up with your primary care doctor or orthopedic doctor. Thank you for allowing me to care for you today. Please return to the emergency department if you have new or worsening symptoms. Take your medications as instructed.

## 2019-07-08 NOTE — ED Notes (Signed)
Patient verbalizes understanding of discharge instructions. Opportunity for questioning and answers were provided. Armband removed by staff, pt discharged from ED ambulatory.   

## 2019-07-08 NOTE — ED Triage Notes (Signed)
Pt here from home with c/o right leg pain , no trauma does have hx of dvt

## 2019-07-08 NOTE — Progress Notes (Signed)
Right lower extremity venous duplex completed. Refer to "CV Proc" under chart review to view preliminary results.  07/08/2019 7:00 PM Eula Fried., MHA, RVT, RDCS, RDMS

## 2019-07-08 NOTE — ED Provider Notes (Signed)
[facility] EMERGENCY DEPARTMENT Provider Note   CSN: [id number] Arrival date & time: 07/08/19  1805     History No chief complaint on file.   Justin [name] is a 54 y.o. male.  Patient is a 54 year old gentleman who has a history of bipolar disorder, DVT, pulmonary embolism presenting to the emergency department for right leg pain.  Patient reports it started after he had a car ride.  He is concerned that he may have another DVT.  He is not currently anticoagulated.  He reports the pain feels worse in his ankle and the medial aspect of his knee whenever he plantar flexes his foot.  Denies any numbness, tingling, weakness        Past Medical History:  Diagnosis Date  . Achilles rupture, left   . Bipolar 1 disorder (HCC)   . Dental caries    periodontitis  . Pneumonia   . Pulmonary embolism (HCC)   . Sleep apnea    wears CPAP  . Subdural hematoma (HCC) 2015    Patient Active Problem List   Diagnosis Date Noted  . Status post lumbar spinal fusion 01/03/2018  . Lumbar stenosis 12/23/2017  . Spondylolisthesis, lumbar region 06/25/2017  . Achilles tendinitis of left lower extremity 12/07/2016  . Achilles tendinitis, left leg 05/17/2016  . Obstructive sleep apnea on CPAP 03/07/2015  . Post-operative state 03/07/2015  . Achilles rupture, left 10/21/2014  . OSA (obstructive sleep apnea) 04/23/2013  . Bipolar disorder, unspecified (HCC) 01/07/2013  . Sinus tachycardia 01/07/2013  . Headache(784.0) 01/07/2013  . SDH (subdural hematoma) (HCC) 01/04/2013  . H/O PE 01/04/2013  . Chronic anticoagulation 01/04/2013  . 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  . Acute sinusitis 01/04/2013    Past Surgical History:  Procedure Laterality Date  . ACHILLES TENDON SURGERY Left 10/21/2014   Procedure: Left Achilles Reconstruction;  Surgeon: Nadara Mustard, MD;  Location: [facility] OR;  Service: Orthopedics;  Laterality: Left;  . APPENDECTOMY    . CRANIOTOMY N/A  01/19/2013   Procedure: CRANIOTOMY HEMATOMA EVACUATION SUBDURAL;  Surgeon: Hewitt Shorts, MD;  Location: [facility];  Service: Neurosurgery;  Laterality: N/A;  . CYSTECTOMY     right head  . ELBOW SURGERY     right  . FRACTURE SURGERY     finger  . I & D EXTREMITY Left 12/07/2016   Procedure: LEFT ACHILLES DEBRIDEMENT;  Surgeon: Nadara Mustard, MD;  Location: [facility] OR;  Service: Orthopedics;  Laterality: Left;  . LUMBAR FUSION  12/23/2017   L5 GILL PROCEDURE, RIGHT L5-S1, TRANSFORAMIAL LUMBAR INTERBODY FUSION, BILATERAL LATERAL FUSION, PEDICLE INSTRUMENTATION  . MULTIPLE EXTRACTIONS WITH ALVEOLOPLASTY N/A 03/07/2015   Procedure: MULTIPLE EXTRACTION WITH ALVEOLOPLASTY;  Surgeon: [name], DDS;  Location: [facility] OR;  Service: Oral Surgery;  Laterality: N/A;  . SPINAL CORD STIMULATOR INSERTION N/A 05/18/2019   Procedure: LUMBAR SPINAL CORD STIMULATOR INSERTION;  Surgeon: Odette Fraction, MD;  Location: [facility] OR;  Service: Neurosurgery;  Laterality: N/A;  Thoracic/Lumbar       Family History  Problem Relation Age of Onset  . Hypertension Mother   . Stroke Mother   . Multiple sclerosis Sister   . Down syndrome Son     Social History   Tobacco Use  . Smoking status: Never Smoker  . Smokeless tobacco: Never Used  Substance Use Topics  . Alcohol use: No  . Drug use: No    Home Medications Prior to Admission medications   Medication Sig Start Date End  Date Taking? Authorizing Provider  cephALEXin (KEFLEX) 500 MG capsule Take 1 capsule (500 mg total) by mouth 4 (four) times daily. 05/18/19   Clydell Hakim, MD  diclofenac sodium (VOLTAREN) 1 % GEL Apply 2 g topically 4 (four) times daily. Patient not taking: Reported on 05/05/2019 01/02/19   Domenic Moras, PA-C  doxepin (SINEQUAN) 10 MG capsule Take 20 mg by mouth at bedtime.  07/15/17   [provider]  hydrOXYzine (ATARAX/VISTARIL) 25 MG tablet Take 25 mg by mouth as needed for anxiety.  09/06/18   [provider]  lidocaine  (LIDODERM) 5 % Place 1 patch onto the skin daily. Remove & Discard patch within 12 hours or as directed by MD 08/09/18   Nuala Alpha A, PA-C  lidocaine (LIDODERM) 5 % Place 1 patch onto the skin daily. Remove & Discard patch within 12 hours or as directed by MD 06/11/19   Nuala Alpha A, PA-C  lithium 600 MG capsule Take 600 mg by mouth at bedtime.    [provider]  methocarbamol (ROBAXIN) 500 MG tablet Take 1 tablet (500 mg total) by mouth 2 (two) times daily. Patient not taking: Reported on 05/05/2019 08/09/18   Nuala Alpha A, PA-C  mirtazapine (REMERON) 45 MG tablet Take 45 mg by mouth at bedtime.    [provider]  naproxen (NAPROSYN) 500 MG tablet Take 1 tablet (500 mg total) by mouth 2 (two) times daily with a meal for 5 days. 07/08/19 07/13/19  Alveria Apley, PA-C  oxyCODONE-acetaminophen (PERCOCET) 10-325 MG tablet Take 1 tablet by mouth every 6 (six) hours as needed for pain. 05/18/19 05/17/20  Clydell Hakim, MD  predniSONE (DELTASONE) 20 MG tablet 3 tabs po day one, then 2 tabs daily x 4 days 01/02/19   Domenic Moras, PA-C  QUEtiapine (SEROQUEL XR) 400 MG 24 hr tablet Take 800 mg by mouth at bedtime.    [provider]    Allergies    Patient has no known allergies.  Review of Systems   Review of Systems  Constitutional: Negative for fever.  Cardiovascular: Negative for leg swelling.  Musculoskeletal: Positive for arthralgias and myalgias. Negative for gait problem.  Skin: Negative for color change.  Neurological: Negative for numbness.  Hematological: Does not bruise/bleed easily.    Physical Exam Updated Vital Signs BP (!) 168/96 (BP Location: Left Arm)   Pulse 89   Temp 98.6 F (37 C) (Oral)   Resp 20   SpO2 99%   Physical Exam Vitals and nursing note reviewed.  Constitutional:      General: He is not in acute distress.    Appearance: Normal appearance. He is not ill-appearing or toxic-appearing.  HENT:     Head: Normocephalic.    Eyes:     Conjunctiva/sclera: Conjunctivae normal.  Pulmonary:     Effort: Pulmonary effort is normal.  Musculoskeletal:     Right lower leg: No edema.     Left lower leg: No edema.     Comments: Patient has normal distal pulses, strength, sensation of the right lower extremity.  He has no skin color changes or swelling in the lower extremity.  He does have some tenderness to palpation to the medial knee joint and pain with extension and flexion of the knee.  He has normal range of motion and normal capillary refill.  Skin:    General: Skin is dry.     Capillary Refill: Capillary refill takes less than 2 seconds.     Findings:  No bruising, erythema, lesion or rash.  Neurological:     Mental Status: He is alert.  Psychiatric:        Mood and Affect: Mood normal.     ED Results / Procedures / Treatments   Labs (all labs ordered are listed, but only abnormal results are displayed) Labs Reviewed - No data to display  EKG None  Radiology VAS [place] LOWER EXTREMITY VENOUS (DVT) (ONLY [facility])  Result Date: 07/08/2019  Lower Venous DVTStudy Indications: Swelling, and remote h/o DVT.  Comparison Study: 04/30/2019- negative lower extremity venous duplex Performing Technologist: Gertie Fey MHA, RDMS, RVT, RDCS  Examination Guidelines: A complete evaluation includes B-mode imaging, spectral Doppler, color Doppler, and power Doppler as needed of all accessible portions of each vessel. Bilateral testing is considered an integral part of a complete examination. Limited examinations for reoccurring indications may be performed as noted. The reflux portion of the exam is performed with the patient in reverse Trendelenburg.  +---------+---------------+---------+-----------+----------+--------------+ RIGHT    CompressibilityPhasicitySpontaneityPropertiesThrombus Aging +---------+---------------+---------+-----------+----------+--------------+ CFV      Full           Yes      Yes                                  +---------+---------------+---------+-----------+----------+--------------+ SFJ      Full                                                        +---------+---------------+---------+-----------+----------+--------------+ FV Prox  Full                                                        +---------+---------------+---------+-----------+----------+--------------+ FV Mid   Full                                                        +---------+---------------+---------+-----------+----------+--------------+ FV DistalFull                                                        +---------+---------------+---------+-----------+----------+--------------+ PFV      Full                                                        +---------+---------------+---------+-----------+----------+--------------+ POP      Full           Yes      Yes                                 +---------+---------------+---------+-----------+----------+--------------+ PTV  Full                                                        +---------+---------------+---------+-----------+----------+--------------+ PERO     Full                                                        +---------+---------------+---------+-----------+----------+--------------+   +----+---------------+---------+-----------+----------+--------------+ LEFTCompressibilityPhasicitySpontaneityPropertiesThrombus Aging +----+---------------+---------+-----------+----------+--------------+ CFV Full           Yes      Yes                                 +----+---------------+---------+-----------+----------+--------------+     Summary: RIGHT: - There is no evidence of deep vein thrombosis in the lower extremity.  - No cystic structure found in the popliteal fossa.  LEFT: - No evidence of common femoral vein obstruction.  *See table(s) above for measurements and observations.    Preliminary      Procedures Procedures (including critical care time)  Medications Ordered in ED Medications  naproxen (NAPROSYN) tablet 500 mg (has no administration in time range)    ED Course  I have reviewed the triage vital signs and the nursing notes.  Pertinent labs & imaging results that were available during my care of the patient were reviewed by me and considered in my medical decision making (see chart for details).  Clinical Course as of Jul 07 2100  Wed Jul 08, 2019  2100 Patient presenting to the emergency department for concern of DVT of the right lower extremity.  His ultrasound was negative.  On my exam he has some medial knee tenderness and ankle tenderness but no other concerning symptoms.  Likely musculoskeletal strain versus osteoarthritis.  We will treat patient for this.  Advised to follow-up with primary care doctor and advised on return precautions.   [KM]    Clinical Course User Index [KM] Jeral Pinch   MDM Rules/Calculators/A&P                      Based on review of vitals, medical screening exam, lab work and/or imaging, there does not appear to be an acute, emergent etiology for the patient's symptoms. Counseled pt on good return precautions and encouraged both PCP and ED follow-up as needed.  Prior to discharge, I also discussed incidental imaging findings with patient in detail and advised appropriate, recommended follow-up in detail.  Clinical Impression: 1. Right leg pain     Disposition: Discharge  Prior to providing a prescription for a controlled substance, I independently reviewed the patient's recent prescription history on the [place] Controlled Substance Reporting System. The patient had no recent or regular prescriptions and was deemed appropriate for a brief, less than 3 day prescription of narcotic for acute analgesia.  This note was prepared with assistance of Conservation officer, historic buildings. Occasional wrong-word or  sound-a-like substitutions may have occurred due to the inherent limitations of voice recognition software.  Final Clinical Impression(s) / ED Diagnoses Final diagnoses:  Right leg pain    Rx / DC Orders ED  Discharge Orders         Ordered    naproxen (NAPROSYN) 500 MG tablet  2 times daily with meals     07/08/19 2101           Jeral Pinch 07/08/19 2102    Mancel Bale, MD 07/08/19 2340

## 2019-08-28 ENCOUNTER — Encounter (HOSPITAL_COMMUNITY): Payer: Self-pay

## 2019-08-28 ENCOUNTER — Emergency Department (HOSPITAL_COMMUNITY)
Admission: EM | Admit: 2019-08-28 | Discharge: 2019-08-28 | Disposition: A | Discharge status: Home / Self Care | Payer: Medicaid Other | Attending: Emergency Medicine | Admitting: Emergency Medicine

## 2019-08-28 DIAGNOSIS — Z79899 Other long term (current) drug therapy: Secondary | ICD-10-CM | POA: Diagnosis not present

## 2019-08-28 DIAGNOSIS — M545 Low back pain: Secondary | ICD-10-CM | POA: Diagnosis not present

## 2019-08-28 DIAGNOSIS — M533 Sacrococcygeal disorders, not elsewhere classified: Secondary | ICD-10-CM | POA: Diagnosis not present

## 2019-08-28 DIAGNOSIS — M5441 Lumbago with sciatica, right side: Secondary | ICD-10-CM

## 2019-08-28 MED ORDER — HYDROCODONE-ACETAMINOPHEN 5-325 MG PO TABS
1.0000 | ORAL_TABLET | Freq: Once | ORAL | Status: AC
  Administered 2019-08-28: 1 via ORAL
  Filled 2019-08-28: qty 1

## 2019-08-28 MED ORDER — METHOCARBAMOL 500 MG PO TABS
750.0000 mg | ORAL_TABLET | Freq: Once | ORAL | Status: AC
  Administered 2019-08-28: 750 mg via ORAL
  Filled 2019-08-28: qty 2

## 2019-08-28 MED ORDER — PREDNISONE 10 MG (21) PO TBPK: ORAL_TABLET | Freq: Every day | ORAL | 0 refills | Status: DC

## 2019-08-28 MED ORDER — METHOCARBAMOL 500 MG PO TABS: 500.0000 mg | ORAL_TABLET | Freq: Three times a day (TID) | ORAL | 0 refills | Status: DC | PRN

## 2019-08-28 MED ORDER — ACETAMINOPHEN 500 MG PO TABS: 500.0000 mg | ORAL_TABLET | Freq: Four times a day (QID) | ORAL | 0 refills | Status: DC | PRN

## 2019-08-28 NOTE — Discharge Instructions (Signed)
1. Medications: Start taking steroid taper as prescribed.  Do not take ibuprofen, Advil, Motrin, or Aleve while taking this medication.  You can take 1 to 2 tablets of Tylenol every 6 hours additionally however.  When you are done taking the steroid taper you can alternate 600 mg of ibuprofen and [phone] mg of Tylenol every 3 hours as needed for pain. Do not exceed 4000 mg of Tylenol daily.  Take ibuprofen with food to avoid upset stomach issues.  You can take methocarbamol as needed for muscle spasm up to twice daily but do not drive, drink alcohol, or operate heavy machinery while taking this medicine because it may make you drowsy.  I typically recommend taking this medicine only at night when you are going to sleep.  You can also cut these tablets in half if they make you feel very drowsy.  Apply lidocaine patches to areas of pain.  2. Treatment: rest, drink plenty of fluids, gentle stretching as discussed (see attached or you can YouTube search sacroiliac physical therapy exercises), alternate ice and heat (or stick with whichever feels best) 20 minutes on 20 minutes off. 3. Follow Up: Please followup with your primary doctor or neurosurgeon in 3-7 days for discussion of your diagnoses and further evaluation after today's visit; return to the ER for worsening back pain, difficulty walking, loss of bowel or bladder control or other concerning symptoms

## 2019-08-28 NOTE — ED Triage Notes (Signed)
Pt arrives to ED w/ c/o lower back and R leg pain secondary to a slip and fall at the [employer] 2 days ago. Pt c/o 8/10 pain.

## 2019-08-28 NOTE — ED Provider Notes (Signed)
[facility] EMERGENCY DEPARTMENT Provider Note   CSN: [id number] Arrival date & time: 08/28/19  1358     History Chief Complaint  Patient presents with  . Back Pain    Justin [name] is a 54 y.o. male with history of PE and DVT, bipolar 1 disorder, OSA, subdural hematoma presenting for evaluation of acute onset, progressively worsening right-sided low back pains for 2 days.  He reports that 3 days ago on Wednesday he was pushing a shopping cart at a grocery store when he slipped on some water on the floor.  He reports that his left foot slipped out in front of him and he had to abruptly attempt to stabilize himself on the shopping cart.  Denies fall or head injury.  He reports no significant pain initially but the following morning when he awoke he noted some sharp pains to the right side radiating down the right lower extremity.  Since then the pain has worsened.  It worsens with laying flat for too long and position changes.  He denies weakness of the extremity, bowel or bladder incontinence, saddle anesthesia, fevers or history of IV drug use.  He has a longstanding history of back pains but reports that his usual back pains improved significantly after the implantation of a spinal cord stimulator and he has not had to take any of his usual medications for his back pains.  He has not tried anything for his symptoms at this time.  He states his symptoms do not feel like his prior DVT.  The history is provided by the patient.       Past Medical History:  Diagnosis Date  . Achilles rupture, left   . Bipolar 1 disorder (HCC)   . Dental caries    periodontitis  . Pneumonia   . Pulmonary embolism (HCC)   . Sleep apnea    wears CPAP  . Subdural hematoma (HCC) 2015    Patient Active Problem List   Diagnosis Date Noted  . Status post lumbar spinal fusion 01/03/2018  . Lumbar stenosis 12/23/2017  . Spondylolisthesis, lumbar region 06/25/2017  . Achilles tendinitis of  left lower extremity 12/07/2016  . Achilles tendinitis, left leg 05/17/2016  . Obstructive sleep apnea on CPAP 03/07/2015  . Post-operative state 03/07/2015  . Achilles rupture, left 10/21/2014  . OSA (obstructive sleep apnea) 04/23/2013  . Bipolar disorder, unspecified (HCC) 01/07/2013  . Sinus tachycardia 01/07/2013  . Headache(784.0) 01/07/2013  . SDH (subdural hematoma) (HCC) 01/04/2013  . H/O PE 01/04/2013  . Chronic anticoagulation 01/04/2013  . 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  . Acute sinusitis 01/04/2013    Past Surgical History:  Procedure Laterality Date  . ACHILLES TENDON SURGERY Left 10/21/2014   Procedure: Left Achilles Reconstruction;  Surgeon: Nadara Mustard, MD;  Location: [facility] OR;  Service: Orthopedics;  Laterality: Left;  . APPENDECTOMY    . CRANIOTOMY N/A 01/19/2013   Procedure: CRANIOTOMY HEMATOMA EVACUATION SUBDURAL;  Surgeon: Hewitt Shorts, MD;  Location: [facility];  Service: Neurosurgery;  Laterality: N/A;  . CYSTECTOMY     right head  . ELBOW SURGERY     right  . FRACTURE SURGERY     finger  . I & D EXTREMITY Left 12/07/2016   Procedure: LEFT ACHILLES DEBRIDEMENT;  Surgeon: Nadara Mustard, MD;  Location: [facility] OR;  Service: Orthopedics;  Laterality: Left;  . LUMBAR FUSION  12/23/2017   L5 GILL PROCEDURE, RIGHT L5-S1, TRANSFORAMIAL LUMBAR INTERBODY FUSION, BILATERAL LATERAL FUSION,  PEDICLE INSTRUMENTATION  . MULTIPLE EXTRACTIONS WITH ALVEOLOPLASTY N/A 03/07/2015   Procedure: MULTIPLE EXTRACTION WITH ALVEOLOPLASTY;  Surgeon: [name], DDS;  Location: [facility] OR;  Service: Oral Surgery;  Laterality: N/A;  . SPINAL CORD STIMULATOR INSERTION N/A 05/18/2019   Procedure: LUMBAR SPINAL CORD STIMULATOR INSERTION;  Surgeon: Odette Fraction, MD;  Location: [facility] OR;  Service: Neurosurgery;  Laterality: N/A;  Thoracic/Lumbar       Family History  Problem Relation Age of Onset  . Hypertension Mother   . Stroke Mother   . Multiple sclerosis Sister   . Down  syndrome Son     Social History   Tobacco Use  . Smoking status: Never Smoker  . Smokeless tobacco: Never Used  Vaping Use  . Vaping Use: Never used  Substance Use Topics  . Alcohol use: No  . Drug use: No    Home Medications Prior to Admission medications   Medication Sig Start Date End Date Taking? Authorizing Provider  lithium 600 MG capsule Take 600 mg by mouth at bedtime.   Yes [provider]  mirtazapine (REMERON) 45 MG tablet Take 45 mg by mouth at bedtime.   Yes [provider]  QUEtiapine (SEROQUEL XR) 400 MG 24 hr tablet Take 800 mg by mouth at bedtime.   Yes [provider]  acetaminophen (TYLENOL) 500 MG tablet Take 1 tablet (500 mg total) by mouth every 6 (six) hours as needed. 08/28/19   Tessa Seaberry A, PA-C  cephALEXin (KEFLEX) 500 MG capsule Take 1 capsule (500 mg total) by mouth 4 (four) times daily. Patient not taking: Reported on 07/08/2019 05/18/19   Odette Fraction, MD  diclofenac sodium (VOLTAREN) 1 % GEL Apply 2 g topically 4 (four) times daily. Patient not taking: Reported on 05/05/2019 01/02/19   [name], PA-C  lidocaine (LIDODERM) 5 % Place 1 patch onto the skin daily. Remove & Discard patch within 12 hours or as directed by MD Patient not taking: Reported on 07/08/2019 08/09/18   Harlene Salts A, PA-C  lidocaine (LIDODERM) 5 % Place 1 patch onto the skin daily. Remove & Discard patch within 12 hours or as directed by MD Patient not taking: Reported on 07/08/2019 06/11/19   Harlene Salts A, PA-C  methocarbamol (ROBAXIN) 500 MG tablet Take 1 tablet (500 mg total) by mouth every 8 (eight) hours as needed for muscle spasms. 08/28/19   Trilby Way A, PA-C  oxyCODONE-acetaminophen (PERCOCET) 10-325 MG tablet Take 1 tablet by mouth every 6 (six) hours as needed for pain. Patient not taking: Reported on 07/08/2019 05/18/19 05/17/20  Odette Fraction, MD  predniSONE (STERAPRED UNI-PAK 21 TAB) 10 MG (21) TBPK tablet Take by mouth daily. Take 6 tabs  by mouth on day 1, then 5 tabs on day 2, then 4 tabs on day 3, then 3 tabs on day 4, 2 tabs on day 5, then 1 tab on day 6 08/28/19   Michela Pitcher A, PA-C    Allergies    Patient has no known allergies.  Review of Systems   Review of Systems  Constitutional: Negative for chills and fever.  Musculoskeletal: Positive for back pain.  Neurological: Negative for numbness.    Physical Exam Updated Vital Signs BP (!) 155/121 (BP Location: Right Arm)   Pulse 87   Temp 98.3 F (36.8 C)   Resp 17   SpO2 100%   Physical Exam Vitals and nursing note reviewed.  Constitutional:      General: He is not in acute  distress.    Appearance: He is well-developed.  HENT:     Head: Normocephalic and atraumatic.  Eyes:     General:        Right eye: No discharge.        Left eye: No discharge.     Conjunctiva/sclera: Conjunctivae normal.  Neck:     Vascular: No JVD.     Trachea: No tracheal deviation.  Cardiovascular:     Rate and Rhythm: Normal rate.     Comments: 2+ DP/PT pulses bilaterally, Homans sign absent bilaterally, no lower extremity edema, no palpable cords, compartments are soft  Pulmonary:     Effort: Pulmonary effort is normal.  Abdominal:     General: Bowel sounds are normal. There is no distension.     Palpations: Abdomen is soft.  Musculoskeletal:        General: Tenderness present.     Comments: No midline lumbar spine tenderness.  Right paralumbar muscle tenderness and spasm noted.  Right SI joint tenderness noted.  No deformity, crepitus, or step-off.  Spinal cord stimulator is palpable on the right side.  There is a well-healed midline surgical incision along the lumbar spine.  5/5 strength of BLE major muscle groups.  Full range of motion of the lumbar spine with pain elicited mostly with flexion  Skin:    General: Skin is warm and dry.     Findings: No erythema.  Neurological:     Mental Status: He is alert.     Sensory: No sensory deficit.     Motor: No weakness.      Comments: Sensation intact to light touch of bilateral lower extremities.  Patient ambulatory with steady gait and balance and is able to heel walk and toe walk without difficulty  Psychiatric:        Behavior: Behavior normal.     ED Results / Procedures / Treatments   Labs (all labs ordered are listed, but only abnormal results are displayed) Labs Reviewed - No data to display  EKG None  Radiology No results found.  Procedures Procedures (including critical care time)  Medications Ordered in ED Medications  HYDROcodone-acetaminophen (NORCO/VICODIN) 5-325 MG per tablet 1 tablet (1 tablet Oral Given 08/28/19 1836)  methocarbamol (ROBAXIN) tablet 750 mg (750 mg Oral Given 08/28/19 1836)    ED Course  I have reviewed the triage vital signs and the nursing notes.  Pertinent labs & imaging results that were available during my care of the patient were reviewed by me and considered in my medical decision making (see chart for details).    MDM Rules/Calculators/A&P                          Patient with back pain after injury on Wednesday night.  He is afebrile, vital signs are at baseline.  He is nontoxic in appearance and is neurovascularly intact.  No midline lumbar spine tenderness on exam and I do not feel that he requires emergent imaging.  No red flag signs concerning for cauda equina or spinal abscess.  He is ambulatory in the ED despite pain.  His physical examination is most consistent with muscle spasm and SI joint dysfunction.  We discussed why his pain is not managed with his spinal cord stimulator alone.  Since his spinal cord stimulator was placed he has not required any pain medicines or muscle relaxants but we discussed the utility of a steroid taper and methocarbamol and advised of  appropriate use of medications and potential side effects.  Recommend follow-up with his PCP and neurosurgeon on an outpatient basis for reevaluation of symptoms.  Discussed strict ED  return precautions. Patient verbalized understanding of and agreement with plan and is safe for discharge home at this time.    Final Clinical Impression(s) / ED Diagnoses Final diagnoses:  Acute right-sided low back pain with right-sided sciatica  SI (sacroiliac) joint dysfunction    Rx / DC Orders ED Discharge Orders         Ordered    predniSONE (STERAPRED UNI-PAK 21 TAB) 10 MG (21) TBPK tablet  Daily     Discontinue  Reprint     08/28/19 1908    methocarbamol (ROBAXIN) 500 MG tablet  Every 8 hours PRN     Discontinue  Reprint     08/28/19 1908    acetaminophen (TYLENOL) 500 MG tablet  Every 6 hours PRN     Discontinue  Reprint     08/28/19 1908           Renita Papa, PA-C 08/28/19 1912    Maudie Flakes, MD 08/28/19 2354

## 2019-09-19 ENCOUNTER — Emergency Department (HOSPITAL_COMMUNITY)
Admission: EM | Admit: 2019-09-19 | Discharge: 2019-09-19 | Disposition: A | Discharge status: Home / Self Care | Payer: Medicaid Other | Attending: Emergency Medicine | Admitting: Emergency Medicine

## 2019-09-19 ENCOUNTER — Emergency Department (HOSPITAL_COMMUNITY): Payer: Medicaid Other

## 2019-09-19 ENCOUNTER — Encounter (HOSPITAL_COMMUNITY): Payer: Self-pay

## 2019-09-19 ENCOUNTER — Other Ambulatory Visit: Payer: Self-pay

## 2019-09-19 DIAGNOSIS — Z79899 Other long term (current) drug therapy: Secondary | ICD-10-CM | POA: Insufficient documentation

## 2019-09-19 DIAGNOSIS — Z7901 Long term (current) use of anticoagulants: Secondary | ICD-10-CM | POA: Diagnosis not present

## 2019-09-19 DIAGNOSIS — M545 Low back pain: Secondary | ICD-10-CM | POA: Diagnosis present

## 2019-09-19 DIAGNOSIS — M5441 Lumbago with sciatica, right side: Secondary | ICD-10-CM | POA: Diagnosis not present

## 2019-09-19 MED ORDER — CYCLOBENZAPRINE HCL 10 MG PO TABS
5.0000 mg | ORAL_TABLET | Freq: Once | ORAL | Status: AC
  Administered 2019-09-19: 5 mg via ORAL
  Filled 2019-09-19: qty 1

## 2019-09-19 MED ORDER — HYDROCODONE-ACETAMINOPHEN 5-325 MG PO TABS
1.0000 | ORAL_TABLET | Freq: Once | ORAL | Status: AC
  Administered 2019-09-19: 1 via ORAL
  Filled 2019-09-19: qty 1

## 2019-09-19 NOTE — ED Triage Notes (Signed)
Patient complains of ongoing lower back pain and right leg intermittent numbness since fall in [employer] 6/9. Alert and oriented

## 2019-09-19 NOTE — ED Notes (Signed)
Patient transported to X-ray 

## 2019-09-19 NOTE — Discharge Instructions (Addendum)
You came to the emergency department with right-sided low back pain and some sciatica after a slip about a month ago.  In the emergency department we had an x-ray which showed no acute fractures.  Is recommended she follow-up with your primary care physician within the next 2 weeks to determine if an MRI would be appropriate for you.

## 2019-09-19 NOTE — ED Provider Notes (Signed)
[facility] EMERGENCY DEPARTMENT Provider Note   CSN: [id number] Arrival date & time: 09/19/19  1518     History No chief complaint on file.   Justin [name] is a 54 y.o. male.  Patient is a 54 year old male with past medical history significant for lumbar spinal fusion, lumbar stenosis, bipolar disorder, history of pulmonary embolus on Eliquis,that presents today for ongoing right-sided back pain which started a few days after a fall he had on June 9.  Patient states that on June the night he was at [facility] with a buggy and slipped on a wet floor, catching himself with the buggy.  He denied hitting his head or loss of consciousness.  Patient states that about 2 days later started experiencing right-sided lower back pain.  He states that he experiences a little bit of weakness while walking on this right side.  He went to the emergency department on 11 June and received prednisone.  He states this did not improve his symptoms and that lately he has noticed that while walking he gets shooting pain down his right leg and the toes of his right foot go numb at times.  Patient denies saddle paresthesias, loss of bowel or bladder function.  Patient denies right-sided calf pain, shortness of breath, chest pain.  Patient states that since his last appointment he tried to follow-up with his neurosurgeon but was referred to his primary care.  Unfortunately he could not get an appointment before 2 weeks from now which is why he states he decided to come to the emergency department.  He states has not really tried anything for pain with his lower back, though he does have a TENS unit at home that he sometimes uses.         Past Medical History:  Diagnosis Date  . Achilles rupture, left   . Bipolar 1 disorder (HCC)   . Dental caries    periodontitis  . Pneumonia   . Pulmonary embolism (HCC)   . Sleep apnea    wears CPAP  . Subdural hematoma (HCC) 2015    Patient Active Problem  List   Diagnosis Date Noted  . Status post lumbar spinal fusion 01/03/2018  . Lumbar stenosis 12/23/2017  . Spondylolisthesis, lumbar region 06/25/2017  . Achilles tendinitis of left lower extremity 12/07/2016  . Achilles tendinitis, left leg 05/17/2016  . Obstructive sleep apnea on CPAP 03/07/2015  . Post-operative state 03/07/2015  . Achilles rupture, left 10/21/2014  . OSA (obstructive sleep apnea) 04/23/2013  . Bipolar disorder, unspecified (HCC) 01/07/2013  . Sinus tachycardia 01/07/2013  . Headache(784.0) 01/07/2013  . SDH (subdural hematoma) (HCC) 01/04/2013  . H/O PE 01/04/2013  . Chronic anticoagulation 01/04/2013  . 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  . Acute sinusitis 01/04/2013    Past Surgical History:  Procedure Laterality Date  . ACHILLES TENDON SURGERY Left 10/21/2014   Procedure: Left Achilles Reconstruction;  Surgeon: Nadara Mustard, MD;  Location: [facility] OR;  Service: Orthopedics;  Laterality: Left;  . APPENDECTOMY    . CRANIOTOMY N/A 01/19/2013   Procedure: CRANIOTOMY HEMATOMA EVACUATION SUBDURAL;  Surgeon: Hewitt Shorts, MD;  Location: [facility];  Service: Neurosurgery;  Laterality: N/A;  . CYSTECTOMY     right head  . ELBOW SURGERY     right  . FRACTURE SURGERY     finger  . I & D EXTREMITY Left 12/07/2016   Procedure: LEFT ACHILLES DEBRIDEMENT;  Surgeon: Nadara Mustard, MD;  Location: [facility] OR;  Service: Orthopedics;  Laterality: Left;  . LUMBAR FUSION  12/23/2017   L5 GILL PROCEDURE, RIGHT L5-S1, TRANSFORAMIAL LUMBAR INTERBODY FUSION, BILATERAL LATERAL FUSION, PEDICLE INSTRUMENTATION  . MULTIPLE EXTRACTIONS WITH ALVEOLOPLASTY N/A 03/07/2015   Procedure: MULTIPLE EXTRACTION WITH ALVEOLOPLASTY;  Surgeon: [name], DDS;  Location: [facility] OR;  Service: Oral Surgery;  Laterality: N/A;  . SPINAL CORD STIMULATOR INSERTION N/A 05/18/2019   Procedure: LUMBAR SPINAL CORD STIMULATOR INSERTION;  Surgeon: Odette Fraction, MD;  Location: [facility] OR;  Service:  Neurosurgery;  Laterality: N/A;  Thoracic/Lumbar       Family History  Problem Relation Age of Onset  . Hypertension Mother   . Stroke Mother   . Multiple sclerosis Sister   . Down syndrome Son     Social History   Tobacco Use  . Smoking status: Never Smoker  . Smokeless tobacco: Never Used  Vaping Use  . Vaping Use: Never used  Substance Use Topics  . Alcohol use: No  . Drug use: No    Home Medications Prior to Admission medications   Medication Sig Start Date End Date Taking? Authorizing Provider  acetaminophen (TYLENOL) 500 MG tablet Take 1 tablet (500 mg total) by mouth every 6 (six) hours as needed. 08/28/19   Fawze, Mina A, PA-C  cephALEXin (KEFLEX) 500 MG capsule Take 1 capsule (500 mg total) by mouth 4 (four) times daily. Patient not taking: Reported on 07/08/2019 05/18/19   Odette Fraction, MD  diclofenac sodium (VOLTAREN) 1 % GEL Apply 2 g topically 4 (four) times daily. Patient not taking: Reported on 05/05/2019 01/02/19   [name], PA-C  ELIQUIS 5 MG TABS tablet Take 5 mg by mouth 2 (two) times daily. 07/14/19   [provider]  hydrOXYzine (ATARAX/VISTARIL) 25 MG tablet Take 25 mg by mouth 3 (three) times daily as needed for anxiety or itching.  08/22/19   [provider]  lidocaine (LIDODERM) 5 % Place 1 patch onto the skin daily. Remove & Discard patch within 12 hours or as directed by MD Patient not taking: Reported on 07/08/2019 08/09/18   Harlene Salts A, PA-C  lidocaine (LIDODERM) 5 % Place 1 patch onto the skin daily. Remove & Discard patch within 12 hours or as directed by MD Patient not taking: Reported on 07/08/2019 06/11/19   Harlene Salts A, PA-C  lithium 600 MG capsule Take 600 mg by mouth at bedtime.    [provider]  methocarbamol (ROBAXIN) 500 MG tablet Take 1 tablet (500 mg total) by mouth every 8 (eight) hours as needed for muscle spasms. 08/28/19   Fawze, Mina A, PA-C  mirtazapine (REMERON) 45 MG tablet Take 45 mg by  mouth at bedtime.    [provider]  oxyCODONE-acetaminophen (PERCOCET) 10-325 MG tablet Take 1 tablet by mouth every 6 (six) hours as needed for pain. Patient not taking: Reported on 07/08/2019 05/18/19 05/17/20  Odette Fraction, MD  predniSONE (STERAPRED UNI-PAK 21 TAB) 10 MG (21) TBPK tablet Take by mouth daily. Take 6 tabs by mouth on day 1, then 5 tabs on day 2, then 4 tabs on day 3, then 3 tabs on day 4, 2 tabs on day 5, then 1 tab on day 6 08/28/19   Luevenia Maxin, Mina A, PA-C  QUEtiapine (SEROQUEL XR) 400 MG 24 hr tablet Take 800 mg by mouth at bedtime.    [provider]    Allergies    Patient has no known allergies.  Review of Systems   Review of Systems  Constitutional: Negative for fever.  HENT: Negative for sore throat.   Eyes: Negative for visual disturbance.  Respiratory: Negative for shortness of breath.   Cardiovascular: Negative for chest pain.  Gastrointestinal: Negative for constipation and diarrhea.  Genitourinary: Negative for dysuria.  Musculoskeletal: Positive for back pain.  Neurological: Negative for dizziness and headaches.    Physical Exam Updated Vital Signs BP (!) 141/82 (BP Location: Left Arm)   Pulse 84   Temp 97.8 F (36.6 C) (Oral)   Resp 16   SpO2 98%   Physical Exam Constitutional:      Appearance: Normal appearance.  HENT:     Head: Normocephalic and atraumatic.     Mouth/Throat:     Mouth: Mucous membranes are moist.  Eyes:     Pupils: Pupils are equal, round, and reactive to light.  Cardiovascular:     Rate and Rhythm: Normal rate and regular rhythm.     Heart sounds: Normal heart sounds.  Pulmonary:     Effort: Pulmonary effort is normal.     Breath sounds: Normal breath sounds.  Abdominal:     General: Abdomen is flat.     Palpations: Abdomen is soft.  Musculoskeletal:        General: Tenderness (Mild tenderness to palpation in musculature just lateral to L5/S1 on right) present. No swelling or deformity (no step-offs,  deformity, tenderness to vertebra).     Cervical back: Normal range of motion and neck supple.     Comments: Midline surgical scar present on lower back Homans' sign negative  Skin:    General: Skin is warm and dry.  Neurological:     General: No focal deficit present.     Mental Status: He is alert.     Cranial Nerves: No cranial nerve deficit.     Sensory: Sensory deficit (endorses reduced fine touch sensation in lateral and medial aspect of right leg vs left) present.     Motor: Weakness (4/5 strength right hip flexor and right plantarflexion) present.  Psychiatric:        Mood and Affect: Mood normal.     ED Results / Procedures / Treatments   Labs (all labs ordered are listed, but only abnormal results are displayed) Labs Reviewed - No data to display  EKG None  Radiology DG Lumbar Spine Complete  Result Date: 09/19/2019 CLINICAL DATA:  Right-sided back pain and leg numbness. EXAM: LUMBAR SPINE - COMPLETE 4+ VIEW COMPARISON:  September 02, 2018 FINDINGS: There is no evidence of an acute lumbar spine fracture. Stable, approximately 1 mm to 2 mm anterolisthesis of the L5 vertebral body is noted on S1. Bilateral radiopaque pedicle screws are seen at the levels of L5 and S1 with radiopaque operative material seen within the L5-S1 intervertebral disc space. Intervertebral disc spaces are maintained. An inferior vena cava filter is present. A radiopaque spinal stimulator and associated stimulator wire are seen. IMPRESSION: 1. Stable postoperative changes at the levels of L5 and S1 with stable, approximately 1 mm anterolisthesis of the L5 vertebral body. Electronically Signed   By: Aram Candela M.D.   On: 09/19/2019 19:06    Procedures Procedures (including critical care time)  Medications Ordered in ED Medications  HYDROcodone-acetaminophen (NORCO/VICODIN) 5-325 MG per tablet 1 tablet (1 tablet Oral Given 09/19/19 1857)  cyclobenzaprine (FLEXERIL) tablet 5 mg (5 mg Oral Given 09/19/19  1857)    ED Course  I have reviewed the triage vital signs and the nursing notes.  Pertinent labs &  imaging results that were available during my care of the patient were reviewed by me and considered in my medical decision making (see chart for details).  Lumbar x-rays showed stable postop changes at the levels of L5 and S1 with stable, proximal 1 mm arterial listhesis of the L5 vertebral body.  Patient was given Norco 5-325 mg and Flexeril 5 mg for pain.  Once lumbar spine x-rays returned patient was discharged with recommendation to follow-up with his primary care physician in the next 1-2 weeks to determine if an MRI in the outpatient setting would be appropriate for him.  Return precautions were given the patient prior to discharge.   MDM Rules/Calculators/A&P                          Patient with right-sided sciatica and lower back pain after a fall approximately 1 month prior.  Denies saddle paresthesias, loss of bowel or bladder function.  States his pain is slowly progressed since that time and he was unable to get a visit with his primary care physician for 2 weeks which prompted his visit to the emergency department.  Overall assessment with physical exam shows the bulk of his pain is confined to the musculature of his right-sided lower back.  Lumbar x-rays shows stable postop changes at the levels of L5 and S1 with stable, proximal 1 mm arterial listhesis of the L5 vertebral body.   Final Clinical Impression(s) / ED Diagnoses Final diagnoses:  Acute right-sided low back pain with right-sided sciatica    Rx / DC Orders ED Discharge Orders    None       Jackelyn Poling, DO 09/19/19 2011    Blane Ohara, MD 09/21/19 [phone]

## 2019-09-19 NOTE — ED Notes (Signed)
Pt verbalized understanding of discharge instructions. Follow up care and pain management reviewed, pt ambulated independently to lobby. 

## 2019-10-21 ENCOUNTER — Telehealth: Payer: Self-pay | Admitting: Orthopaedic Surgery

## 2019-10-21 NOTE — Telephone Encounter (Signed)
12/23/2017 OP note faxed to [facility] [phone] per patients request. auth valid

## 2019-12-14 LAB — COLOGUARD: COLOGUARD: NEGATIVE

## 2020-01-03 ENCOUNTER — Emergency Department (HOSPITAL_COMMUNITY): Payer: Medicaid Other

## 2020-01-03 ENCOUNTER — Other Ambulatory Visit: Payer: Self-pay

## 2020-01-03 ENCOUNTER — Encounter (HOSPITAL_COMMUNITY): Payer: Self-pay | Admitting: Emergency Medicine

## 2020-01-03 ENCOUNTER — Emergency Department (HOSPITAL_BASED_OUTPATIENT_CLINIC_OR_DEPARTMENT_OTHER): Payer: Medicaid Other

## 2020-01-03 ENCOUNTER — Emergency Department (HOSPITAL_COMMUNITY)
Admission: EM | Admit: 2020-01-03 | Discharge: 2020-01-03 | Disposition: A | Discharge status: Home / Self Care | Payer: Medicaid Other | Attending: Emergency Medicine | Admitting: Emergency Medicine

## 2020-01-03 DIAGNOSIS — M79609 Pain in unspecified limb: Secondary | ICD-10-CM

## 2020-01-03 DIAGNOSIS — Z20822 Contact with and (suspected) exposure to covid-19: Secondary | ICD-10-CM | POA: Insufficient documentation

## 2020-01-03 DIAGNOSIS — M79604 Pain in right leg: Secondary | ICD-10-CM | POA: Insufficient documentation

## 2020-01-03 DIAGNOSIS — R509 Fever, unspecified: Secondary | ICD-10-CM | POA: Insufficient documentation

## 2020-01-03 DIAGNOSIS — Z7901 Long term (current) use of anticoagulants: Secondary | ICD-10-CM | POA: Insufficient documentation

## 2020-01-03 LAB — BASIC METABOLIC PANEL
Anion gap: 8 (ref 5–15)
BUN: <5 mg/dL — ABNORMAL LOW (ref 6–20)
CO2: 24 mmol/L (ref 22–32)
Calcium: 8.8 mg/dL — ABNORMAL LOW (ref 8.9–10.3)
Chloride: 107 mmol/L (ref 98–111)
Creatinine, Ser: 1.42 mg/dL — ABNORMAL HIGH (ref 0.61–1.24)
GFR, Estimated: 55 mL/min — ABNORMAL LOW (ref >60)
Glucose, Bld: 106 mg/dL — ABNORMAL HIGH (ref 70–99)
Potassium: 3.5 mmol/L (ref 3.5–5.1)
Sodium: 139 mmol/L (ref 135–145)

## 2020-01-03 LAB — CBC
HCT: 39.7 % (ref 39.0–52.0)
Hemoglobin: 12.7 g/dL — ABNORMAL LOW (ref 13.0–17.0)
MCH: 27.1 pg (ref 26.0–34.0)
MCHC: 32 g/dL (ref 30.0–36.0)
MCV: 84.6 fL (ref 80.0–100.0)
Platelets: 251 10*3/uL (ref 150–400)
RBC: 4.69 MIL/uL (ref 4.22–5.81)
RDW: 14 % (ref 11.5–15.5)
WBC: 12.9 10*3/uL — ABNORMAL HIGH (ref 4.0–10.5)
nRBC: 0 % (ref 0.0–0.2)

## 2020-01-03 LAB — TROPONIN I (HIGH SENSITIVITY)
Troponin I (High Sensitivity): 19 ng/L — ABNORMAL HIGH (ref <18)
Troponin I (High Sensitivity): 25 ng/L — ABNORMAL HIGH (ref <18)

## 2020-01-03 LAB — LACTIC ACID, PLASMA: Lactic Acid, Venous: 1.1 mmol/L (ref 0.5–1.9)

## 2020-01-03 LAB — RESPIRATORY PANEL BY RT PCR (FLU A&B, COVID)
Influenza A by PCR: NEGATIVE
Influenza B by PCR: NEGATIVE
SARS Coronavirus 2 by RT PCR: NEGATIVE

## 2020-01-03 MED ORDER — HYDROCODONE-ACETAMINOPHEN 5-325 MG PO TABS
2.0000 | ORAL_TABLET | Freq: Once | ORAL | Status: AC
  Administered 2020-01-03: 2 via ORAL
  Filled 2020-01-03: qty 2

## 2020-01-03 NOTE — ED Provider Notes (Signed)
[facility] EMERGENCY DEPARTMENT Provider Note   CSN: [id number] Arrival date & time: 01/03/20  1413     History Chief Complaint  Patient presents with   Leg Pain   Chest Pain    Justin [name] is a 54 y.o. male.  54 year old male with past medical history of PE on Eliquis presents with complaint of pain in his right leg for the past week.  Patient states that he has had pain in the bottom of his foot up his entire right leg coming up into the right side of his body.  Patient states pain is constant, worse with trying to walk or palpation of the leg, nothing makes his pain any better.  Denies recent falls or injuries.  Patient states that he had to discontinue his Eliquis for a few days due to a back injection.  Patient has a history of prior lumbar fusion and has a spine stimulator in place.  Patient denies chest pain, shortness of breath, fever.  No other complaints or concerns.        Past Medical History:  Diagnosis Date   Achilles rupture, left    Bipolar 1 disorder (HCC)    Dental caries    periodontitis   Pneumonia    Pulmonary embolism (HCC)    Sleep apnea    wears CPAP   Subdural hematoma (HCC) 2015    Patient Active Problem List   Diagnosis Date Noted   Status post lumbar spinal fusion 01/03/2018   Lumbar stenosis 12/23/2017   Spondylolisthesis, lumbar region 06/25/2017   Achilles tendinitis of left lower extremity 12/07/2016   Achilles tendinitis, left leg 05/17/2016   Obstructive sleep apnea on CPAP 03/07/2015   Post-operative state 03/07/2015   Achilles rupture, left 10/21/2014   OSA (obstructive sleep apnea) 04/23/2013   Bipolar disorder, unspecified (HCC) 01/07/2013   Sinus tachycardia 01/07/2013   Headache(784.0) 01/07/2013   SDH (subdural hematoma) (HCC) 01/04/2013   H/O PE 01/04/2013   Chronic anticoagulation 01/04/2013   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   Acute sinusitis 01/04/2013     Past Surgical History:  Procedure Laterality Date   ACHILLES TENDON SURGERY Left 10/21/2014   Procedure: Left Achilles Reconstruction;  Surgeon: Nadara Mustard, MD;  Location: [facility] OR;  Service: Orthopedics;  Laterality: Left;   APPENDECTOMY     CRANIOTOMY N/A 01/19/2013   Procedure: CRANIOTOMY HEMATOMA EVACUATION SUBDURAL;  Surgeon: Hewitt Shorts, MD;  Location: [facility] NEURO ORS;  Service: Neurosurgery;  Laterality: N/A;   CYSTECTOMY     right head   ELBOW SURGERY     right   FRACTURE SURGERY     finger   I & D EXTREMITY Left 12/07/2016   Procedure: LEFT ACHILLES DEBRIDEMENT;  Surgeon: Nadara Mustard, MD;  Location: [facility] OR;  Service: Orthopedics;  Laterality: Left;   LUMBAR FUSION  12/23/2017   L5 GILL PROCEDURE, RIGHT L5-S1, TRANSFORAMIAL LUMBAR INTERBODY FUSION, BILATERAL LATERAL FUSION, PEDICLE INSTRUMENTATION   MULTIPLE EXTRACTIONS WITH ALVEOLOPLASTY N/A 03/07/2015   Procedure: MULTIPLE EXTRACTION WITH ALVEOLOPLASTY;  Surgeon: [name], DDS;  Location: [facility] OR;  Service: Oral Surgery;  Laterality: N/A;   SPINAL CORD STIMULATOR INSERTION N/A 05/18/2019   Procedure: LUMBAR SPINAL CORD STIMULATOR INSERTION;  Surgeon: Odette Fraction, MD;  Location: [facility] OR;  Service: Neurosurgery;  Laterality: N/A;  Thoracic/Lumbar       Family History  Problem Relation Age of Onset   Hypertension Mother    Stroke Mother    Multiple sclerosis  Sister    Down syndrome Son     Social History   Tobacco Use   Smoking status: Never Smoker   Smokeless tobacco: Never Used  Building services engineer Use: Never used  Substance Use Topics   Alcohol use: No   Drug use: No    Home Medications Prior to Admission medications   Medication Sig Start Date End Date Taking? Authorizing Provider  doxepin (SINEQUAN) 75 MG capsule Take 75 mg by mouth at bedtime. 12/14/19  Yes [provider]  ELIQUIS 5 MG TABS tablet Take 5 mg by mouth 2 (two) times daily. 07/14/19  Yes [provider]  hydrOXYzine (ATARAX/VISTARIL) 25 MG tablet Take 25 mg by mouth at bedtime.  08/22/19  Yes [provider]  lithium 600 MG capsule Take 600 mg by mouth at bedtime.   Yes [provider]  lithium carbonate (ESKALITH) 450 MG CR tablet Take 450 mg by mouth daily as needed (depression).  12/14/19  Yes [provider]  mirtazapine (REMERON) 45 MG tablet Take 45 mg by mouth at bedtime.   Yes [provider]  QUEtiapine (SEROQUEL XR) 400 MG 24 hr tablet Take 800 mg by mouth at bedtime.   Yes [provider]  tiZANidine (ZANAFLEX) 4 MG tablet Take 4 mg by mouth at bedtime.  12/17/19  Yes [provider]  acetaminophen (TYLENOL) 500 MG tablet Take 1 tablet (500 mg total) by mouth every 6 (six) hours as needed. Patient not taking: Reported on 01/03/2020 08/28/19   Michela Pitcher A, PA-C  cephALEXin (KEFLEX) 500 MG capsule Take 1 capsule (500 mg total) by mouth 4 (four) times daily. Patient not taking: Reported on 07/08/2019 05/18/19   Odette Fraction, MD  diclofenac sodium (VOLTAREN) 1 % GEL Apply 2 g topically 4 (four) times daily. Patient not taking: Reported on 05/05/2019 01/02/19   [name], PA-C  lidocaine (LIDODERM) 5 % Place 1 patch onto the skin daily. Remove & Discard patch within 12 hours or as directed by MD Patient not taking: Reported on 07/08/2019 08/09/18   Harlene Salts A, PA-C  lidocaine (LIDODERM) 5 % Place 1 patch onto the skin daily. Remove & Discard patch within 12 hours or as directed by MD Patient not taking: Reported on 07/08/2019 06/11/19   Harlene Salts A, PA-C  methocarbamol (ROBAXIN) 500 MG tablet Take 1 tablet (500 mg total) by mouth every 8 (eight) hours as needed for muscle spasms. Patient not taking: Reported on 01/03/2020 08/28/19   Michela Pitcher A, PA-C  oxyCODONE-acetaminophen (PERCOCET) 10-325 MG tablet Take 1 tablet by mouth every 6 (six) hours as needed for pain. Patient not taking: Reported on 07/08/2019 05/18/19 05/17/20   Odette Fraction, MD  predniSONE (STERAPRED UNI-PAK 21 TAB) 10 MG (21) TBPK tablet Take by mouth daily. Take 6 tabs by mouth on day 1, then 5 tabs on day 2, then 4 tabs on day 3, then 3 tabs on day 4, 2 tabs on day 5, then 1 tab on day 6 Patient not taking: Reported on 01/03/2020 08/28/19   Michela Pitcher A, PA-C    Allergies    Lyrica [pregabalin]  Review of Systems   Review of Systems  Constitutional: Positive for fever.  HENT: Negative for congestion.   Respiratory: Negative for cough and shortness of breath.   Cardiovascular: Negative for chest pain.  Gastrointestinal: Negative for abdominal pain, nausea and vomiting.  Musculoskeletal: Positive for myalgias. Negative for gait problem.  Skin: Negative for  rash and wound.  Allergic/Immunologic: Negative for immunocompromised state.  Neurological: Positive for numbness. Negative for weakness.  Psychiatric/Behavioral: Negative for confusion.  All other systems reviewed and are negative.   Physical Exam Updated Vital Signs BP (!) 149/105    Pulse 97    Temp 98.1 F (36.7 C) (Oral)    Resp 18    Ht 5\' 9"  (1.753 m)    Wt 104.3 kg    SpO2 96%    BMI 33.97 kg/m   Physical Exam Vitals and nursing note reviewed.  Constitutional:      General: He is not in acute distress.    Appearance: He is well-developed. He is not diaphoretic.     Comments: Temp check in room, 100.1 oral  HENT:     Head: Normocephalic and atraumatic.  Cardiovascular:     Rate and Rhythm: Normal rate and regular rhythm.     Heart sounds: Normal heart sounds. No murmur heard.   Pulmonary:     Effort: Pulmonary effort is normal.     Breath sounds: Normal breath sounds.  Chest:     Chest wall: No tenderness.  Abdominal:     Palpations: Abdomen is soft.     Tenderness: There is no abdominal tenderness.  Musculoskeletal:     Lumbar back: No tenderness or bony tenderness.       Back:     Right lower leg: Tenderness present. No edema.     Left lower leg: No  tenderness. No edema.     Comments: No low back pain. Reports generalized tenderness to entire right leg, reports numbness in all toes. Toe nails are thick, great and 2nd toe nails are dark in comparison to remaining nails. Capillary refill present to each toe.  Skin:    General: Skin is dry.     Findings: No erythema or rash.     Comments: Hot to the touch  Neurological:     Mental Status: He is alert and oriented to person, place, and time.  Psychiatric:        Behavior: Behavior normal.     ED Results / Procedures / Treatments   Labs (all labs ordered are listed, but only abnormal results are displayed) Labs Reviewed  BASIC METABOLIC PANEL - Abnormal; Notable for the following components:      Result Value   Glucose, Bld 106 (*)    BUN <5 (*)    Creatinine, Ser 1.42 (*)    Calcium 8.8 (*)    GFR, Estimated 55 (*)    All other components within normal limits  CBC - Abnormal; Notable for the following components:   WBC 12.9 (*)    Hemoglobin 12.7 (*)    All other components within normal limits  TROPONIN I (HIGH SENSITIVITY) - Abnormal; Notable for the following components:   Troponin I (High Sensitivity) 19 (*)    All other components within normal limits  TROPONIN I (HIGH SENSITIVITY) - Abnormal; Notable for the following components:   Troponin I (High Sensitivity) 25 (*)    All other components within normal limits  RESPIRATORY PANEL BY RT PCR (FLU A&B, COVID)  LACTIC ACID, PLASMA    EKG None  Radiology DG Chest 2 View  Result Date: 01/03/2020 CLINICAL DATA:  Chest pain and fever EXAM: CHEST - 2 VIEW COMPARISON:  May 07, 2018 FINDINGS: There is scarring in the base, stable. There is no edema or airspace opacity. Heart is upper normal in size with pulmonary vascular  normal. Thoracic stimulator lead tips are in the posterior midthoracic region. IMPRESSION: Stable scarring lateral left base. Lungs elsewhere clear. Heart upper normal in size. Electronically Signed    By: Bretta Bang III M.D.   On: 01/03/2020 15:24   VAS [place] LOWER EXTREMITY VENOUS (DVT) (ONLY [facility] 7a-7p)  Result Date: 01/03/2020  Lower Venous DVT Study Indications: Pain. Other Indications: Patient had to stop Eliquis for 3 days for spinal injection. 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 Anticoagulation: Eliquis. Comparison Study: Prior negative study done 07/08/19 Performing Technologist: Sherren Kerns RVS  Examination Guidelines: A complete evaluation includes B-mode imaging, spectral Doppler, color Doppler, and power Doppler as needed of all accessible portions of each vessel. Bilateral testing is considered an integral part of a complete examination. Limited examinations for reoccurring indications may be performed as noted. The reflux portion of the exam is performed with the patient in reverse Trendelenburg.  +---------+---------------+---------+-----------+----------+--------------+  RIGHT     Compressibility Phasicity Spontaneity Properties Thrombus Aging  +---------+---------------+---------+-----------+----------+--------------+  CFV       Full            Yes       Yes                                    +---------+---------------+---------+-----------+----------+--------------+  SFJ       Full                                                             +---------+---------------+---------+-----------+----------+--------------+  FV Prox   Full                                                             +---------+---------------+---------+-----------+----------+--------------+  FV Mid    Full                                                             +---------+---------------+---------+-----------+----------+--------------+  FV Distal Full                                                             +---------+---------------+---------+-----------+----------+--------------+  PFV       Full                                                              +---------+---------------+---------+-----------+----------+--------------+  POP       Full  Yes       Yes                                    +---------+---------------+---------+-----------+----------+--------------+  PTV       Full                                                             +---------+---------------+---------+-----------+----------+--------------+  PERO      Full                                                             +---------+---------------+---------+-----------+----------+--------------+   +----+---------------+---------+-----------+----------+--------------+  LEFT Compressibility Phasicity Spontaneity Properties Thrombus Aging  +----+---------------+---------+-----------+----------+--------------+  CFV  Full            Yes       Yes                                    +----+---------------+---------+-----------+----------+--------------+     Summary: RIGHT: - Findings appear essentially unchanged compared to previous examination. - There is no evidence of deep vein thrombosis in the lower extremity.  - No cystic structure found in the popliteal fossa. - Ultrasound characteristics of enlarged lymph nodes are noted in the groin.  LEFT: - No evidence of common femoral vein obstruction. - Ultrasound characteristics of enlarged lymph nodes noted in the groin.  *See table(s) above for measurements and observations.    Preliminary     Procedures Procedures (including critical care time)  Medications Ordered in ED Medications  HYDROcodone-acetaminophen (NORCO/VICODIN) 5-325 MG per tablet 2 tablet (2 tablets Oral Given 01/03/20 1710)    ED Course  I have reviewed the triage vital signs and the nursing notes.  Pertinent labs & imaging results that were available during my care of the patient were reviewed by me and considered in my medical decision making (see chart for details).  Clinical Course as of Jan 02 1853  Wynelle Link Jan 03, 2020  [age] year old male with history of  PE presents with complaint of right leg pain x 1 week without swelling or injury. Patient came to the ER tonight to be evaluated for possible DVT due to history with leg pain. Reports stopping his Eliquis for 3-4 days last week for a steroid injection in his back on Thursday (3 days ago). On exam, diffuse right leg pain without swelling, erythema. Normal DP pulse, skin is warm to the touch/temperature symmetric. Reports pins and needles sensation in all toes.  DVT study is negative for DVT, shows reactive LN in both groins, no tenderness or appreciable LN on exam.  CBC with mild [place] ptosis at 12.9, question of this is due to his recent steroid injection.  BMP without significant changes from prior, creatinine currently 1.42.  Lactic acid reassuring at 1.1.  Covid test is negative. Patient checked into triage with a temperature of 100.7, temp on recheck was 100.1.  Patient denies feeling feverish or any  complaints of sick symptoms today. Troponin initially 19, 25 on recheck, EKG with PVCs, no ischemic changes.  Discussed with Dr. Clarice Pole.  Patient is adamant that he does not have chest pain, he informed triage that he had a history of PE and thought this was why the EKG was done.  Patient continues to remain chest pain-free, no complaints of shortness of breath.  Discussed results and plan of care with patient, he feels reassured that his DVT study is negative and will follow up as planned with his pain management team.   [LM]    Clinical Course User Index [LM] Alden Hipp   MDM Rules/Calculators/A&P                          Final Clinical Impression(s) / ED Diagnoses Final diagnoses:  Right leg pain    Rx / DC Orders ED Discharge Orders    None       Jeannie Fend, PA-C 01/03/20 1854    Arby Barrette, MD 01/04/20 1410

## 2020-01-03 NOTE — Progress Notes (Signed)
VASCULAR LAB    Right lower extremity venous duplex has been performed.  See CV proc for preliminary results.  Messaged Army Melia, PA-C with results.  Falana Clagg, RVT 01/03/2020, 5:29 PM

## 2020-01-03 NOTE — Discharge Instructions (Addendum)
Follow up with your care team as scheduled. Your ultrasound is negative for DVT.

## 2020-01-03 NOTE — ED Triage Notes (Signed)
Pt reports history of blood clots.  C/o R foot pain x 2 days that radiates up into chest with mild SOB.  States he stopped Eliquis for 3-4 days this week in order to get injection in his back but has started taking it again.

## 2020-01-11 ENCOUNTER — Telehealth: Payer: Self-pay

## 2020-01-11 ENCOUNTER — Other Ambulatory Visit: Payer: Self-pay | Admitting: Neurological Surgery

## 2020-01-11 DIAGNOSIS — M5416 Radiculopathy, lumbar region: Secondary | ICD-10-CM

## 2020-01-11 NOTE — Telephone Encounter (Signed)
Phone call to patient to verify medication list and allergies for myelogram procedure. Pt instructed to hold Doxepin, Lithium, and Remeron for 48hrs prior to myelogram appointment time and 24 hours after appointment. Per Dr. Karin Golden, pt could continue to take his Seroquel for this procedure. Pt also instructed to have a driver the day of the procedure, the procedure would take around 2 hours, and discharge instructions discussed. Pt verbalized understanding.

## 2020-01-19 ENCOUNTER — Ambulatory Visit
Admission: RE | Admit: 2020-01-19 | Discharge: 2020-01-19 | Disposition: A | Discharge status: Home / Self Care | Payer: Medicaid Other | Source: Ambulatory Visit | Attending: Neurological Surgery | Admitting: Neurological Surgery

## 2020-01-19 ENCOUNTER — Other Ambulatory Visit: Payer: Self-pay

## 2020-01-19 VITALS — BP 129/90 | HR 89

## 2020-01-19 DIAGNOSIS — Z981 Arthrodesis status: Secondary | ICD-10-CM

## 2020-01-19 DIAGNOSIS — M5416 Radiculopathy, lumbar region: Secondary | ICD-10-CM

## 2020-01-19 DIAGNOSIS — M4316 Spondylolisthesis, lumbar region: Secondary | ICD-10-CM

## 2020-01-19 MED ORDER — IOPAMIDOL (ISOVUE-M 200) INJECTION 41%
18.0000 mL | Freq: Once | INTRAMUSCULAR | Status: AC
  Administered 2020-01-19: 18 mL via INTRATHECAL

## 2020-01-19 MED ORDER — DIAZEPAM 5 MG PO TABS
10.0000 mg | ORAL_TABLET | Freq: Once | ORAL | Status: AC
  Administered 2020-01-19: 10 mg via ORAL

## 2020-01-19 NOTE — Progress Notes (Signed)
Per Dr. Mosetta Putt, pt can resume the medications he stopped for the myelogram procedure (Doxepin, Lithium, and Remeron) today at 1030 PM. 12 hours after myelogram. Pt verbalized understanding

## 2020-01-19 NOTE — Progress Notes (Signed)
Patient states he has been off Doxepin, Lithium and Remeron for at least the past two days (and feels "just awful!").

## 2020-01-19 NOTE — Discharge Instructions (Signed)
Myelogram Discharge Instructions  1. Go home and rest quietly for the next 24 hours.  It is important to lie flat for the next 24 hours.  Get up only to go to the restroom.  You may lie in the bed or on a couch on your back, your stomach, your left side or your right side.  You may have one pillow under your head.  You may have pillows between your knees while you are on your side or under your knees while you are on your back.  2. DO NOT drive today.  Recline the seat as far back as it will go, while still wearing your seat belt, on the way home.  3. You may get up to go to the bathroom as needed.  You may sit up for 10 minutes to eat.  You may resume your normal diet and medications unless otherwise indicated.  Drink plenty of extra fluids today and tomorrow.  4. The incidence of a spinal headache with nausea and/or vomiting is about 5% (one in 20 patients).  If you develop a headache, lie flat and drink plenty of fluids until the headache goes away.  Caffeinated beverages may be helpful.  If you develop severe nausea and vomiting or a headache that does not go away with flat bed rest, call [phone].  5. You may resume normal activities after your 24 hours of bed rest is over; however, do not exert yourself strongly or do any heavy lifting tomorrow.  6. Call your physician for a follow-up appointment.    You may resume Doxepin, Lithium and Remeron today at 1030 PM

## 2020-02-04 ENCOUNTER — Other Ambulatory Visit: Payer: Self-pay | Admitting: Pain Medicine

## 2020-03-02 ENCOUNTER — Inpatient Hospital Stay (HOSPITAL_COMMUNITY): Admission: RE | Admit: 2020-03-02 | Payer: Medicaid Other | Source: Ambulatory Visit

## 2020-03-02 ENCOUNTER — Other Ambulatory Visit (HOSPITAL_COMMUNITY): Payer: Medicaid Other

## 2020-03-30 NOTE — Progress Notes (Addendum)
Your procedure is scheduled on Wednesday, April 06, 2020 at 1:02 PM.  Report to [facility] Main Entrance "A" at 11:00 A.M., and check in at the Admitting office.  Call this number if you have problems the morning of surgery:  [phone]  Call [phone] if you have any questions prior to your surgery date Monday-Friday 8am-4pm    Remember:  Do not eat or drink after midnight the night before your surgery    Take these medicines the morning of surgery with A SIP OF WATER:   NONE  As of today, STOP taking any Aspirin (unless otherwise instructed by your surgeon) Aleve, Naproxen, Ibuprofen, Motrin, Advil, Goody's, BC's, all herbal medications, fish oil, and all vitamins.                      Do not wear jewelry.            Do not wear lotions, powders, colognes, or deodorant.            Do not shave 48 hours prior to surgery.  Men may shave face and neck.            Do not bring valuables to the hospital.            [facility] is not responsible for any belongings or valuables.  Do NOT Smoke (Tobacco/Vaping) or drink Alcohol 24 hours prior to your procedure If you use a CPAP at night, you may bring all equipment for your overnight stay.   Contacts, glasses, dentures or bridgework may not be worn into surgery.      For patients admitted to the hospital, discharge time will be determined by your treatment team.   Patients discharged the day of surgery will not be allowed to drive home, and someone needs to stay with them for 24 hours.    Special instructions:   [place]- Preparing For Surgery  Before surgery, you can play an important role. Because skin is not sterile, your skin needs to be as free of germs as possible. You can reduce the number of germs on your skin by washing with CHG (chlorahexidine gluconate) Soap before surgery.  CHG is an antiseptic cleaner which kills germs and bonds with the skin to continue killing germs even after washing.    Oral Hygiene is  also important to reduce your risk of infection.  Remember - BRUSH YOUR TEETH THE MORNING OF SURGERY WITH YOUR REGULAR TOOTHPASTE  Please do not use if you have an allergy to CHG or antibacterial soaps. If your skin becomes reddened/irritated stop using the CHG.  Do not shave (including legs and underarms) for at least 48 hours prior to first CHG shower. It is OK to shave your face.  Please follow these instructions carefully.   1. Shower the NIGHT BEFORE SURGERY and the MORNING OF SURGERY with CHG Soap.   2. If you chose to wash your hair, wash your hair first as usual with your normal shampoo.  3. After you shampoo, rinse your hair and body thoroughly to remove the shampoo.  4. Use CHG as you would any other liquid soap. You can apply CHG directly to the skin and wash gently with a scrungie or a clean washcloth.   5. Apply the CHG Soap to your body ONLY FROM THE NECK DOWN.  Do not use on open wounds or open sores. Avoid contact with your eyes, ears, mouth and genitals (private parts). Wash Face and  genitals (private parts)  with your normal soap.   6. Wash thoroughly, paying special attention to the area where your surgery will be performed.  7. Thoroughly rinse your body with warm water from the neck down.  8. DO NOT shower/wash with your normal soap after using and rinsing off the CHG Soap.  9. Pat yourself dry with a CLEAN TOWEL.  10. Wear CLEAN PAJAMAS to bed the night before surgery  11. Place CLEAN SHEETS on your bed the night of your first shower and DO NOT SLEEP WITH PETS.   Day of Surgery: Wear Clean/Comfortable clothing the morning of surgery Do not apply any deodorants/lotions.   Remember to brush your teeth WITH YOUR REGULAR TOOTHPASTE.   Please read over the following fact sheets that you were given.

## 2020-03-31 ENCOUNTER — Encounter (HOSPITAL_COMMUNITY): Payer: Self-pay

## 2020-03-31 ENCOUNTER — Encounter (HOSPITAL_COMMUNITY)
Admission: RE | Admit: 2020-03-31 | Discharge: 2020-03-31 | Disposition: A | Discharge status: Home / Self Care | Payer: Medicaid Other | Source: Ambulatory Visit | Attending: Neurological Surgery | Admitting: Neurological Surgery

## 2020-03-31 ENCOUNTER — Other Ambulatory Visit: Payer: Self-pay

## 2020-03-31 DIAGNOSIS — Z01812 Encounter for preprocedural laboratory examination: Secondary | ICD-10-CM | POA: Diagnosis not present

## 2020-03-31 LAB — BASIC METABOLIC PANEL
Anion gap: 8 (ref 5–15)
BUN: 5 mg/dL — ABNORMAL LOW (ref 6–20)
CO2: 26 mmol/L (ref 22–32)
Calcium: 9.5 mg/dL (ref 8.9–10.3)
Chloride: 106 mmol/L (ref 98–111)
Creatinine, Ser: 1.52 mg/dL — ABNORMAL HIGH (ref 0.61–1.24)
GFR, Estimated: 53 mL/min — ABNORMAL LOW (ref >60)
Glucose, Bld: 139 mg/dL — ABNORMAL HIGH (ref 70–99)
Potassium: 4.1 mmol/L (ref 3.5–5.1)
Sodium: 140 mmol/L (ref 135–145)

## 2020-03-31 LAB — CBC
HCT: 39.7 % (ref 39.0–52.0)
Hemoglobin: 12.6 g/dL — ABNORMAL LOW (ref 13.0–17.0)
MCH: 26.9 pg (ref 26.0–34.0)
MCHC: 31.7 g/dL (ref 30.0–36.0)
MCV: 84.6 fL (ref 80.0–100.0)
Platelets: 245 10*3/uL (ref 150–400)
RBC: 4.69 MIL/uL (ref 4.22–5.81)
RDW: 15.1 % (ref 11.5–15.5)
WBC: 10 10*3/uL (ref 4.0–10.5)
nRBC: 0 % (ref 0.0–0.2)

## 2020-03-31 LAB — SURGICAL PCR SCREEN
MRSA, PCR: NEGATIVE
Staphylococcus aureus: NEGATIVE

## 2020-03-31 NOTE — Anesthesia Preprocedure Evaluation (Addendum)
Anesthesia Evaluation  Patient identified by MRN, date of birth, ID band Patient awake    Reviewed: Allergy & Precautions, NPO status , Patient's Chart, lab work & pertinent test results  Airway Mallampati: III  TM Distance: >3 FB Neck ROM: Full    Dental  (+) Dental Advisory Given, Upper Dentures, Partial Lower   Pulmonary sleep apnea and Continuous Positive Airway Pressure Ventilation , PE   Pulmonary exam normal breath sounds clear to auscultation       Cardiovascular negative cardio ROS Normal cardiovascular exam Rhythm:Regular Rate:Normal     Neuro/Psych  Headaches, PSYCHIATRIC DISORDERS Bipolar Disorder Lumbar post-laminectomy syndrome     GI/Hepatic negative GI ROS, Neg liver ROS,   Endo/Other  Obesity   Renal/GU negative Renal ROS     Musculoskeletal negative musculoskeletal ROS (+)   Abdominal   Peds  Hematology  (+) Blood dyscrasia (Eliquis), anemia ,   Anesthesia Other Findings Day of surgery medications reviewed with the patient.  Reproductive/Obstetrics                            Anesthesia Physical Anesthesia Plan  ASA: II  Anesthesia Plan: General   Post-op Pain Management:    Induction: Intravenous  PONV Risk Score and Plan: 2 and Midazolam, Dexamethasone and Ondansetron  Airway Management Planned: Oral ETT  Additional Equipment:   Intra-op Plan:   Post-operative Plan: Extubation in OR  Informed Consent: I have reviewed the patients History and Physical, chart, labs and discussed the procedure including the risks, benefits and alternatives for the proposed anesthesia with the patient or authorized representative who has indicated his/her understanding and acceptance.     Dental advisory given  Plan Discussed with: CRNA  Anesthesia Plan Comments: (PAT note written by Shonna Chock, PA-C. )       Anesthesia Quick Evaluation

## 2020-03-31 NOTE — Pre-Procedure Instructions (Addendum)
Justin [name]  03/31/2020    Your procedure is scheduled on Wednesday, January 19. 2022 at 1:00 PM.   Report to [facility] Entrance "A" Admitting Office at 11:00 AM.   Call this number if you have problems the morning of surgery: [phone]   Questions prior to day of surgery, please call [phone] between 8 & 4 PM.   Remember:  Do not eat or drink after midnight Tuesday, 04/05/20.  Take these medicines the morning of surgery with A SIP OF WATER: Lithium Carbonate (Eskalith)  Stop Eliquis as instructed by surgeon/physician.  Do not use Aspirin containing products, NSAIDS (Ibuprofen, Aleve, etc), Multivitamins, Fish Oil or Herbal medications prior to surgery.    Do not wear jewelry.  Do not wear lotions, powders, cologne or deodorant.  Men may shave face and neck.  Do not bring valuables to the hospital.  [facility] is not responsible for any belongings or valuables.  Contacts, dentures or bridgework may not be worn into surgery.  Leave your suitcase in the car.  After surgery it may be brought to your room.  For patients admitted to the hospital, discharge time will be determined by your treatment team.  Patients discharged the day of surgery will not be allowed to drive home.   [place] - Preparing for Surgery  Before surgery, you can play an important role.  Because skin is not sterile, your skin needs to be as free of germs as possible.  You can reduce the number of germs on you skin by washing with CHG (chlorahexidine gluconate) soap before surgery.  CHG is an antiseptic cleaner which kills germs and bonds with the skin to continue killing germs even after washing.  Oral Hygiene is also important in reducing the risk of infection.  Remember to brush your teeth with your regular toothpaste the morning of surgery.  Please DO NOT use if you have an allergy to CHG or antibacterial soaps.  If your skin becomes reddened/irritated stop using the CHG and inform your  nurse when you arrive at Short Stay.  Do not shave (including legs and underarms) for at least 48 hours prior to the first CHG shower.  You may shave your face.  Please follow these instructions carefully:   1.  Shower with CHG Soap the night before surgery and the morning of Surgery.  2.  If you choose to wash your hair, wash your hair first as usual with your normal shampoo.  3.  After you shampoo, rinse your hair and body thoroughly to remove the shampoo. 4.  Use CHG as you would any other liquid soap.  You can apply chg directly to the skin and wash gently with a      scrungie or washcloth.           5.  Apply the CHG Soap to your body ONLY FROM THE NECK DOWN.   Do not use on open wounds or open sores. Avoid contact with your eyes, ears, mouth and genitals (private parts).  Wash genitals (private parts) with your normal soap - do this prior to using CHG soap.  6.  Wash thoroughly, paying special attention to the area where your surgery will be performed.  7.  Thoroughly rinse your body with warm water from the neck down.  8.  DO NOT shower/wash with your normal soap after using and rinsing off the CHG Soap.  9.  Pat yourself dry with a clean towel.  10.  Wear clean pajamas.            11.  Place clean sheets on your bed the night of your first shower and do not sleep with pets.  Day of Surgery  Shower as above. Do not apply any lotions/deodorants the morning of surgery.   Please wear clean clothes to the hospital. Remember to brush your teeth with toothpaste.  Please read over the fact sheets that you were given.

## 2020-03-31 NOTE — Progress Notes (Signed)
Anesthesia Chart Review:  Case: [id number] Date/Time: 04/06/20 1247   Procedure: Revision of spinal cord stimulator (N/A ) - /d/c from pacu   Anesthesia type: General   Pre-op diagnosis: Lumbar post laminectomy syndrome   [facility] ROOM 19 / [facility]   Surgeons: Dawley, Alan Mulder, DO      DISCUSSION: Patient is a 55 year old male scheduled for the above procedure.   History includes never smoker, Bipolar 1 disorder, PE (bilateral PE with right heart strain 05/18/07), SDH (01/04/13 in setting of supratherapeutic INR; s/p IVC filter placement 01/06/13; s/p right frontal parietal craniotomy with SDH evacuation 01/19/13), OSA (CPAP), left achilles reconstruction (10/21/14; s/p debridement with removal of necrotic tendon and retained suture, secondary reconstruction 12/07/16), back surgery Delane Ginger procedure L5-S1, excision extradural cyst, right transforaminal interbody fusion 12/23/17; spinal cord stimulator insertion 05/18/19). Lab trends suggest history of CKD (~ CKD stage II/IIIa by [facility] labs). He had normal coronaries with EF ~ 40% on 03/04/08 (at [facility]; scanned under Media tab)with stable EF of 40% by 2018 echo. Previous polysubstance abuse (+ UDS cocaine at [facility] 2009, completed treatment program ~ 2014). BMI is consistent with obesity.    Preoperative risk assessment dated 02/04/20 was signed by Fleet Contras, MD classifying patient at "Low Risk" with permission to hold Eliquis x 7 days for revision of spinal cord stimulator. Patient reported previously neurosurgery instructions to hold for 4 days which I confirmed would be acceptable per [facility] at Dr. Mattie Marlin office.  Preoperative COVID-19 testing is scheduled for 04/02/20. Anesthesia team to evaluate on the day of surgery.    VS: BP (!) 128/94   Pulse 98   Temp 36.9 C (Oral)   Resp 18   Ht 5\' 9"  (1.753 m)   Wt 109.6 kg   SpO2 98%   BMI 35.69 kg/m     PROVIDERS: , MD is PCP ([facility])   LABS: Labs reviewed: Acceptable for surgery. Cr 1.52, previously 1.42 01/03/20.  (all labs ordered are listed, but only abnormal results are displayed)  Labs Reviewed  CBC - Abnormal; Notable for the following components:      Result Value   Hemoglobin 12.6 (*)    All other components within normal limits  BASIC METABOLIC PANEL - Abnormal; Notable for the following components:   Glucose, Bld 139 (*)    BUN 5 (*)    Creatinine, Ser 1.52 (*)    GFR, Estimated 53 (*)    All other components within normal limits  SURGICAL PCR SCREEN     IMAGES: CT L-spine 01/19/20: IMPRESSION: 1. L5-S1 posterior decompression and fusion with patent spinal canal. Moderate right neural foraminal stenosis. 2. Mild disc bulging and moderate facet hypertrophy at L4-5 without stenosis. 3. Aortic Atherosclerosis (ICD10-I70.0).  CXR 01/03/20: FINDINGS: There is scarring in the base, stable. There is no edema or airspace opacity. Heart is upper normal in size with pulmonary vascular normal. Thoracic stimulator lead tips are in the posterior midthoracic region. IMPRESSION: Stable scarring lateral left base. Lungs elsewhere clear. Heart upper normal in size.   EKG: 01/03/20:  Sinus tachycardia with frequent Premature ventricular complexes Possible Anterior infarct , age undetermined Abnormal ECG No significant change since last tracing Confirmed by 01/05/20 [phone]) on 01/04/2020 9:46:44 AM   CV: LE Venous 01/06/2020 01/03/20: Summary:  RIGHT:  - Findings appear essentially unchanged compared to previous examination.  - There is no evidence of deep vein  thrombosis in the lower extremity.  - No cystic structure found in the popliteal fossa.  - Ultrasound characteristics of enlarged lymph nodes are noted in the  groin.  LEFT:  - No evidence of common femoral vein obstruction.  - Ultrasound characteristics of enlarged lymph nodes noted in the groin.   Echo 03/26/16 (ordered by  Aron Baba., MD for evaluation of possible TIA symptoms, mild small vessel ischemic disease without acute abnormality on 01/13/16 brain MRI): Study Conclusions  - Left ventricle: The cavity size was normal. Wall thickness was  increased in a pattern of mild LVH. The estimated ejection  fraction was 40%. Diffuse hypokinesis. Doppler parameters are  consistent with abnormal left ventricular relaxation (grade 1  diastolic dysfunction).  - Aortic valve: There was no stenosis.  - Mitral valve: There was no significant regurgitation.  - Right ventricle: The cavity size was normal. Systolic function  was normal.  - Pulmonary arteries: No complete TR doppler jet so unable to  estimate PA systolic pressure.  - Inferior vena cava: The vessel was normal in size. The  respirophasic diameter changes were in the normal range (>= 50%),  consistent with normal central venous pressure.  Impressions:  - Normal LV size with mild LV hypertrophy. EF 40% with diffuse  hypokinesis. Normal RV size and systolic function. No significant  valvular abnormalities.    Cardiac cath 03/04/08 [facility]; Belva Bertin., MD; scanned under Media tab, Correspondence Encounter date 01/04/13, page [phone]): FINDINGS:  Left Dominant Normal coronaries Post-procedure LVEF estimated at 40%. HEMODYNAMICS: AO: 101/68 (87) LV: 105/10, 16 LVEDP: 16 IMPRESSION:  1. Normal coronaries. 2. Mild non-ischemic CMP PLAN: 1. Med Rx 2. Lifestyle modifictions EF of 40-45% with normal coronary artery disease.   Past Medical History:  Diagnosis Date  . Achilles rupture, left   . Bipolar 1 disorder (HCC)   . Dental caries    periodontitis  . Pneumonia   . Pulmonary embolism (HCC) 05/18/2007  . Sleep apnea    wears CPAP  . Subdural hematoma (HCC) 01/04/2013   in setting of supratherapeutic INR    Past Surgical History:  Procedure Laterality Date  . ACHILLES TENDON SURGERY Left 10/21/2014    Procedure: Left Achilles Reconstruction;  Surgeon: Nadara Mustard, MD;  Location: [facility] OR;  Service: Orthopedics;  Laterality: Left;  . APPENDECTOMY    . CARDIAC CATHETERIZATION  03/04/2018   [facility]: Normal coronaries, LVEF estimated at 40%, medical Rx  . CRANIOTOMY N/A 01/19/2013   Procedure: CRANIOTOMY HEMATOMA EVACUATION SUBDURAL;  Surgeon: Hewitt Shorts, MD;  Location: [facility] NEURO ORS;  Service: Neurosurgery;  Laterality: N/A;  . CYSTECTOMY     right head  . ELBOW SURGERY     right  . FRACTURE SURGERY     finger  . I & D EXTREMITY Left 12/07/2016   Procedure: LEFT ACHILLES DEBRIDEMENT;  Surgeon: Nadara Mustard, MD;  Location: [facility] OR;  Service: Orthopedics;  Laterality: Left;  . LUMBAR FUSION  12/23/2017   L5 GILL PROCEDURE, RIGHT L5-S1, TRANSFORAMIAL LUMBAR INTERBODY FUSION, BILATERAL LATERAL FUSION, PEDICLE INSTRUMENTATION  . MULTIPLE EXTRACTIONS WITH ALVEOLOPLASTY N/A 03/07/2015   Procedure: MULTIPLE EXTRACTION WITH ALVEOLOPLASTY;  Surgeon: [name], DDS;  Location: [facility] OR;  Service: Oral Surgery;  Laterality: N/A;  . SPINAL CORD STIMULATOR INSERTION N/A 05/18/2019   Procedure: LUMBAR SPINAL CORD STIMULATOR INSERTION;  Surgeon: Odette Fraction, MD;  Location[facility] OR;  Service: Neurosurgery;  Laterality: N/A;  Thoracic/Lumbar    MEDICATIONS: . acetaminophen (TYLENOL)  500 MG tablet  . doxepin (SINEQUAN) 75 MG capsule  . ELIQUIS 5 MG TABS tablet  . hydrOXYzine (ATARAX/VISTARIL) 25 MG tablet  . lithium 600 MG capsule  . lithium carbonate (ESKALITH) 450 MG CR tablet  . mirtazapine (REMERON) 45 MG tablet  . QUEtiapine (SEROQUEL XR) 400 MG 24 hr tablet  . tiZANidine (ZANAFLEX) 4 MG tablet   No current facility-administered medications for this encounter.    Shonna Chock, PA-C Surgical Short Stay/Anesthesiology [facility] Phone [phone] [facility] Phone [phone] 03/31/2020 2:22 PM

## 2020-03-31 NOTE — Progress Notes (Signed)
PCP - [facility]  Chest x-ray - 01/03/20 EKG -  01/05/20  ECHO - 03/26/16  Sleep Study - 2015 (Epic) CPAP - yes  Blood Thinner Instructions: pt is on Eliquis, states when he was scheduled for this surgery earlier, he was told to hold it 4 days prior to surgery  COVID TEST- scheduled for 04/02/20   Anesthesia review: yes, have requested Eliquis instructions and clearance from Jessica at Dr. Kathlene Cote office  Patient denies shortness of breath, fever, cough and chest pain at PAT appointment   All instructions explained to the patient, with a verbal understanding of the material. Patient agrees to go over the instructions while at home for a better understanding. Patient also instructed to self quarantine after being tested for COVID-19. The opportunity to ask questions was provided.

## 2020-04-02 ENCOUNTER — Other Ambulatory Visit (HOSPITAL_COMMUNITY)
Admission: RE | Admit: 2020-04-02 | Discharge: 2020-04-02 | Disposition: A | Discharge status: Home / Self Care | Payer: Medicaid Other | Source: Ambulatory Visit | Attending: Neurological Surgery | Admitting: Neurological Surgery

## 2020-04-02 DIAGNOSIS — Z20822 Contact with and (suspected) exposure to covid-19: Secondary | ICD-10-CM | POA: Insufficient documentation

## 2020-04-02 DIAGNOSIS — Z01812 Encounter for preprocedural laboratory examination: Secondary | ICD-10-CM | POA: Diagnosis not present

## 2020-04-02 LAB — SARS CORONAVIRUS 2 (TAT 6-24 HRS): SARS Coronavirus 2: NEGATIVE

## 2020-04-06 ENCOUNTER — Encounter (HOSPITAL_COMMUNITY): Payer: Self-pay | Admitting: Neurological Surgery

## 2020-04-06 ENCOUNTER — Ambulatory Visit (HOSPITAL_COMMUNITY): Payer: Medicaid Other | Admitting: Anesthesiology

## 2020-04-06 ENCOUNTER — Ambulatory Visit (HOSPITAL_COMMUNITY): Payer: Medicaid Other | Admitting: Vascular Surgery

## 2020-04-06 ENCOUNTER — Ambulatory Visit (HOSPITAL_COMMUNITY)
Admission: RE | Admit: 2020-04-06 | Discharge: 2020-04-06 | Disposition: A | Discharge status: Home / Self Care | Payer: Medicaid Other | Attending: Neurological Surgery | Admitting: Neurological Surgery

## 2020-04-06 ENCOUNTER — Ambulatory Visit (HOSPITAL_COMMUNITY): Payer: Medicaid Other

## 2020-04-06 ENCOUNTER — Other Ambulatory Visit: Payer: Self-pay

## 2020-04-06 ENCOUNTER — Encounter (HOSPITAL_COMMUNITY)
Admission: RE | Disposition: A | Discharge status: Home / Self Care | Payer: Self-pay | Source: Home / Self Care | Attending: Neurological Surgery

## 2020-04-06 DIAGNOSIS — G894 Chronic pain syndrome: Secondary | ICD-10-CM | POA: Diagnosis not present

## 2020-04-06 DIAGNOSIS — Y831 Surgical operation with implant of artificial internal device as the cause of abnormal reaction of the patient, or of later complication, without mention of misadventure at the time of the procedure: Secondary | ICD-10-CM | POA: Insufficient documentation

## 2020-04-06 DIAGNOSIS — Z419 Encounter for procedure for purposes other than remedying health state, unspecified: Secondary | ICD-10-CM

## 2020-04-06 DIAGNOSIS — T85112A Breakdown (mechanical) of implanted electronic neurostimulator (electrode) of spinal cord, initial encounter: Secondary | ICD-10-CM | POA: Insufficient documentation

## 2020-04-06 HISTORY — PX: SPINAL CORD STIMULATOR INSERTION: SHX5378

## 2020-04-06 LAB — PROTIME-INR
INR: 1.1 (ref 0.8–1.2)
Prothrombin Time: 13.9 seconds (ref 11.4–15.2)

## 2020-04-06 SURGERY — INSERTION, SPINAL CORD STIMULATOR, LUMBAR
Anesthesia: General

## 2020-04-06 MED ORDER — BACITRACIN ZINC 500 UNIT/GM EX OINT
TOPICAL_OINTMENT | CUTANEOUS | Status: DC | PRN
  Administered 2020-04-06: 1 via TOPICAL

## 2020-04-06 MED ORDER — ONDANSETRON HCL 4 MG/2ML IJ SOLN
INTRAMUSCULAR | Status: DC | PRN
  Administered 2020-04-06: 4 mg via INTRAVENOUS

## 2020-04-06 MED ORDER — MIDAZOLAM HCL 2 MG/2ML IJ SOLN
INTRAMUSCULAR | Status: AC
  Filled 2020-04-06: qty 2

## 2020-04-06 MED ORDER — PROMETHAZINE HCL 25 MG/ML IJ SOLN: 6.2500 mg | INTRAMUSCULAR | Status: DC | PRN

## 2020-04-06 MED ORDER — CEFAZOLIN SODIUM-DEXTROSE 2-4 GM/100ML-% IV SOLN
2.0000 g | INTRAVENOUS | Status: AC
  Administered 2020-04-06: 2 g via INTRAVENOUS
  Filled 2020-04-06: qty 100

## 2020-04-06 MED ORDER — 0.9 % SODIUM CHLORIDE (POUR BTL) OPTIME
TOPICAL | Status: DC | PRN
  Administered 2020-04-06: 1000 mL

## 2020-04-06 MED ORDER — ACETAMINOPHEN 500 MG PO TABS
1000.0000 mg | ORAL_TABLET | Freq: Once | ORAL | Status: AC
  Administered 2020-04-06: 1000 mg via ORAL
  Filled 2020-04-06: qty 2

## 2020-04-06 MED ORDER — PHENYLEPHRINE HCL-NACL 10-0.9 MG/250ML-% IV SOLN
INTRAVENOUS | Status: DC | PRN
  Administered 2020-04-06: 40 ug/min via INTRAVENOUS

## 2020-04-06 MED ORDER — DEXAMETHASONE SODIUM PHOSPHATE 10 MG/ML IJ SOLN
INTRAMUSCULAR | Status: DC | PRN
  Administered 2020-04-06: 10 mg via INTRAVENOUS

## 2020-04-06 MED ORDER — SUGAMMADEX SODIUM 200 MG/2ML IV SOLN
INTRAVENOUS | Status: DC | PRN
  Administered 2020-04-06: 200 mg via INTRAVENOUS

## 2020-04-06 MED ORDER — FENTANYL CITRATE (PF) 250 MCG/5ML IJ SOLN
INTRAMUSCULAR | Status: DC | PRN
  Administered 2020-04-06: 100 ug via INTRAVENOUS

## 2020-04-06 MED ORDER — CHLORHEXIDINE GLUCONATE CLOTH 2 % EX PADS: 6.0000 | MEDICATED_PAD | Freq: Once | CUTANEOUS | Status: DC

## 2020-04-06 MED ORDER — ROCURONIUM BROMIDE 10 MG/ML (PF) SYRINGE
PREFILLED_SYRINGE | INTRAVENOUS | Status: DC | PRN
  Administered 2020-04-06: 10 mg via INTRAVENOUS
  Administered 2020-04-06: 50 mg via INTRAVENOUS

## 2020-04-06 MED ORDER — FENTANYL CITRATE (PF) 100 MCG/2ML IJ SOLN
INTRAMUSCULAR | Status: AC
  Filled 2020-04-06: qty 2

## 2020-04-06 MED ORDER — LIDOCAINE 2% (20 MG/ML) 5 ML SYRINGE
INTRAMUSCULAR | Status: DC | PRN
  Administered 2020-04-06: 80 mg via INTRAVENOUS

## 2020-04-06 MED ORDER — MIDAZOLAM HCL 5 MG/5ML IJ SOLN
INTRAMUSCULAR | Status: DC | PRN
  Administered 2020-04-06: 4 mg via INTRAVENOUS

## 2020-04-06 MED ORDER — BACITRACIN ZINC 500 UNIT/GM EX OINT
TOPICAL_OINTMENT | CUTANEOUS | Status: AC
  Filled 2020-04-06: qty 28.35

## 2020-04-06 MED ORDER — LACTATED RINGERS IV SOLN: INTRAVENOUS | Status: DC

## 2020-04-06 MED ORDER — ONDANSETRON HCL 4 MG/2ML IJ SOLN
INTRAMUSCULAR | Status: AC
  Filled 2020-04-06: qty 2

## 2020-04-06 MED ORDER — LIDOCAINE 2% (20 MG/ML) 5 ML SYRINGE
INTRAMUSCULAR | Status: AC
  Filled 2020-04-06: qty 5

## 2020-04-06 MED ORDER — EPHEDRINE SULFATE-NACL 50-0.9 MG/10ML-% IV SOSY
PREFILLED_SYRINGE | INTRAVENOUS | Status: DC | PRN
  Administered 2020-04-06: 10 mg via INTRAVENOUS
  Administered 2020-04-06: 5 mg via INTRAVENOUS

## 2020-04-06 MED ORDER — FENTANYL CITRATE (PF) 250 MCG/5ML IJ SOLN
INTRAMUSCULAR | Status: AC
  Filled 2020-04-06: qty 5

## 2020-04-06 MED ORDER — FENTANYL CITRATE (PF) 100 MCG/2ML IJ SOLN
25.0000 ug | INTRAMUSCULAR | Status: DC | PRN
  Administered 2020-04-06: 50 ug via INTRAVENOUS

## 2020-04-06 MED ORDER — CHLORHEXIDINE GLUCONATE 0.12 % MT SOLN
15.0000 mL | Freq: Once | OROMUCOSAL | Status: AC
  Administered 2020-04-06: 15 mL via OROMUCOSAL
  Filled 2020-04-06: qty 15

## 2020-04-06 MED ORDER — BUPIVACAINE HCL 0.25 % IJ SOLN
INTRAMUSCULAR | Status: DC | PRN
  Administered 2020-04-06: 6 mL

## 2020-04-06 MED ORDER — BUPIVACAINE HCL (PF) 0.25 % IJ SOLN
INTRAMUSCULAR | Status: AC
  Filled 2020-04-06: qty 30

## 2020-04-06 MED ORDER — DEXAMETHASONE SODIUM PHOSPHATE 10 MG/ML IJ SOLN
INTRAMUSCULAR | Status: AC
  Filled 2020-04-06: qty 1

## 2020-04-06 MED ORDER — ORAL CARE MOUTH RINSE: 15.0000 mL | Freq: Once | OROMUCOSAL | Status: AC

## 2020-04-06 MED ORDER — EPHEDRINE 5 MG/ML INJ
INTRAVENOUS | Status: AC
  Filled 2020-04-06: qty 10

## 2020-04-06 MED ORDER — ROCURONIUM BROMIDE 10 MG/ML (PF) SYRINGE
PREFILLED_SYRINGE | INTRAVENOUS | Status: AC
  Filled 2020-04-06: qty 10

## 2020-04-06 MED ORDER — PROPOFOL 10 MG/ML IV BOLUS
INTRAVENOUS | Status: AC
  Filled 2020-04-06: qty 20

## 2020-04-06 MED ORDER — PROPOFOL 10 MG/ML IV BOLUS
INTRAVENOUS | Status: DC | PRN
  Administered 2020-04-06: 180 mg via INTRAVENOUS

## 2020-04-06 SURGICAL SUPPLY — 66 items
ANCHOR CLIK X NEURO (Stimulator) ×3 IMPLANT
BAND RUBBER #18 3X1/16 STRL (MISCELLANEOUS) IMPLANT
BINDER ABDOMINAL 12 XL 75-84 (SOFTGOODS) ×3 IMPLANT
BLADE SURG 11 STRL SS (BLADE) ×3 IMPLANT
BUR MATCHSTICK NEURO 3.0 LAGG (BURR) IMPLANT
CARTRIDGE OIL MAESTRO DRILL (MISCELLANEOUS) IMPLANT
CHLORAPREP W/TINT 26 (MISCELLANEOUS) ×3 IMPLANT
CNTNR URN SCR LID CUP LEK RST (MISCELLANEOUS) IMPLANT
CONT SPEC 4OZ STRL OR WHT (MISCELLANEOUS)
COVER BACK TABLE 60X90IN (DRAPES) IMPLANT
COVER MAYO STAND STRL (DRAPES) IMPLANT
COVER WAND RF STERILE (DRAPES) ×3 IMPLANT
DECANTER SPIKE VIAL GLASS SM (MISCELLANEOUS) ×3 IMPLANT
DERMABOND ADVANCED (GAUZE/BANDAGES/DRESSINGS)
DERMABOND ADVANCED .7 DNX12 (GAUZE/BANDAGES/DRESSINGS) IMPLANT
DEVICE FIXATE SUTURING MECH (NEUROSURGERY SUPPLIES) ×1
DEVICE FIXATE SUTURING SINGLE (NEUROSURGERY SUPPLIES) ×6 IMPLANT
DIFFUSER DRILL AIR PNEUMATIC (MISCELLANEOUS) IMPLANT
DRAPE C-ARM 42X72 X-RAY (DRAPES) ×3 IMPLANT
DRAPE C-ARMOR (DRAPES) ×3 IMPLANT
DRAPE LAPAROTOMY 100X72X124 (DRAPES) ×3 IMPLANT
DRAPE MICROSCOPE LEICA (MISCELLANEOUS) IMPLANT
DRAPE SURG 17X23 STRL (DRAPES) ×12 IMPLANT
DRSG OPSITE POSTOP 4X6 (GAUZE/BANDAGES/DRESSINGS) ×6 IMPLANT
DURAPREP 26ML APPLICATOR (WOUND CARE) IMPLANT
ELECT REM PT RETURN 9FT ADLT (ELECTROSURGICAL) ×3
ELECTRODE REM PT RTRN 9FT ADLT (ELECTROSURGICAL) ×1 IMPLANT
GAUZE 4X4 16PLY RFD (DISPOSABLE) IMPLANT
GAUZE SPONGE 4X4 12PLY STRL (GAUZE/BANDAGES/DRESSINGS) IMPLANT
GLOVE ECLIPSE 7.5 STRL STRAW (GLOVE) ×9 IMPLANT
GLOVE ECLIPSE 8.0 STRL XLNG CF (GLOVE) ×3 IMPLANT
GLOVE SRG 8 PF TXTR STRL LF DI (GLOVE) ×1 IMPLANT
GLOVE SURG UNDER POLY LF SZ8 (GLOVE) ×2
GOWN STRL REUS W/ TWL LRG LVL3 (GOWN DISPOSABLE) IMPLANT
GOWN STRL REUS W/ TWL XL LVL3 (GOWN DISPOSABLE) ×1 IMPLANT
GOWN STRL REUS W/TWL 2XL LVL3 (GOWN DISPOSABLE) ×3 IMPLANT
GOWN STRL REUS W/TWL LRG LVL3 (GOWN DISPOSABLE)
GOWN STRL REUS W/TWL XL LVL3 (GOWN DISPOSABLE) ×2
KIT BASIN OR (CUSTOM PROCEDURE TRAY) ×3 IMPLANT
KIT LEAD 50CM (Lead) ×6 IMPLANT
KIT TURNOVER KIT B (KITS) ×3 IMPLANT
LEAD BAND NEUROSTIMULATOR TISS ×2 IMPLANT
MARKER SKIN DUAL TIP RULER LAB (MISCELLANEOUS) IMPLANT
NEEDLE HYPO 25X1 1.5 SAFETY (NEEDLE) ×3 IMPLANT
NEEDLE SPNL 18GX3.5 QUINCKE PK (NEEDLE) ×3 IMPLANT
NS IRRIG 1000ML POUR BTL (IV SOLUTION) ×3 IMPLANT
OIL CARTRIDGE MAESTRO DRILL (MISCELLANEOUS)
PACK LAMINECTOMY NEURO (CUSTOM PROCEDURE TRAY) ×3 IMPLANT
PAD ARMBOARD 7.5X6 YLW CONV (MISCELLANEOUS) IMPLANT
PATTIES SURGICAL .5 X.5 (GAUZE/BANDAGES/DRESSINGS) IMPLANT
PATTIES SURGICAL .5 X1 (DISPOSABLE) IMPLANT
PATTIES SURGICAL 1X1 (DISPOSABLE) IMPLANT
PLUG PORT SPARE SPECTRA (MISCELLANEOUS) ×6 IMPLANT
SPONGE LAP 4X18 RFD (DISPOSABLE) IMPLANT
SPONGE SURGIFOAM ABS GEL 12-7 (HEMOSTASIS) IMPLANT
STAPLER VISISTAT 35W (STAPLE) ×3 IMPLANT
SUT VIC AB 0 CT1 27 (SUTURE)
SUT VIC AB 0 CT1 27XBRD ANBCTR (SUTURE) IMPLANT
SUT VIC AB 2-0 CP2 18 (SUTURE) ×6 IMPLANT
SUT VIC AB 3-0 SH 8-18 (SUTURE) IMPLANT
TOOL LONG TUNNEL (SPINAL CORD STIMULATOR) ×3 IMPLANT
TOWEL GREEN STERILE (TOWEL DISPOSABLE) IMPLANT
TOWEL GREEN STERILE FF (TOWEL DISPOSABLE) IMPLANT
TRAY FOLEY MTR SLVR 16FR STAT (SET/KITS/TRAYS/PACK) IMPLANT
WATER STERILE IRR 1000ML POUR (IV SOLUTION) ×3 IMPLANT
WRENCH HEX 7.6 (SPINAL CORD STIMULATOR) ×3 IMPLANT

## 2020-04-06 NOTE — Discharge Instructions (Signed)
-   Keep dressing dry - Do not drive - follow up clinic in 5 days - resume home meds - resume home diet - Do not lift over 5 lbs. - No excessive bending or twisting - office: [facility]               [phone]                          [address] [place]

## 2020-04-06 NOTE — Transfer of Care (Signed)
Immediate Anesthesia Transfer of Care Note  Patient: Justin [name]  Procedure(s) Performed: Revision of spinal cord stimulator (N/A )  Patient Location: PACU  Anesthesia Type:General  Level of Consciousness: awake, alert  and oriented  Airway & Oxygen Therapy: Patient Spontanous Breathing  Post-op Assessment: Report given to RN, Post -op Vital signs reviewed and stable and Patient moving all extremities X 4  Post vital signs: Reviewed and stable  Last Vitals:  Vitals Value Taken Time  BP 128/96 04/06/20 1418  Temp 36.8 C 04/06/20 1418  Pulse 86 04/06/20 1433  Resp 15 04/06/20 1433  SpO2 94 % 04/06/20 1433  Vitals shown include unvalidated device data.  Last Pain:  Vitals:   04/06/20 1418  TempSrc:   PainSc: 0-No pain         Complications: No complications documented.

## 2020-04-06 NOTE — Op Note (Signed)
Preoperative Diagnosis: 1.  Fractured spinal cord stimulator leads 2.  Chronic pain syndrome 3.  History of lumbar spine surgery  Postoperative Diagnosis: Same  Procedure Performed: 1.  Removal of SCS leads 2.  Placement of SCS leads X 2  Surgeon: Davy Pique  Assistant: none  Anesthesia: General  Procedure in Detail: Patient met in holding area. Eliquis held X 7 days.  Questions answered, consent obtained.  Patient taken to OR and placed under general anesthesia.  Placed prone.  All pressure points padded.  Sterile prep and drape.  Time out.  3gm ancef given. Old generator incision anesthetized with 0.25% bupivicaine.   Incision made over generation.  Generator dissected out, leads disconnected.  Attention directed to midline of thoracic spine.  Previous lead placement noted.  Old incision anesthetized with 0.25% bupivicaine.  Incision made.  Leads dissected out, removed along with anchors.  T12-L1 interspace identified.  Accessed with 14g touhy needle with loss of resistance to air under flouroscopy.  8 contact lead easily threaded to top of T8 just to right.  Second lead placed in an identical fashion.  Placemen confirmed in both AP and lateral views.  Touhy needle and stylettes removed.  Leads secured using manufacturer's anchors with stay-fix device.  Final imaging noted no migration.  Leads tunneled to battery pocket, connected to battery.  All parameters appropriate.  Leads secured to battery with torque wrench.  Each pocket copiously irrigated with saline.  Hemostasis achieved.  Each pocket closed with series of 2-0 vicryl suture followed by staples and sterile dressing.  Complications: none  Fluids: per anesthesia  EBL: minimal   Disposition:  PACU, then home.  Clinic f/u in 5 days.

## 2020-04-06 NOTE — Anesthesia Procedure Notes (Signed)
Procedure Name: Intubation Date/Time: 04/06/2020 12:30 PM Performed by: Kyung Rudd, CRNA Pre-anesthesia Checklist: Patient identified, Emergency Drugs available, Suction available and Patient being monitored Patient Re-evaluated:Patient Re-evaluated prior to induction Oxygen Delivery Method: Circle system utilized Preoxygenation: Pre-oxygenation with 100% oxygen Induction Type: IV induction Ventilation: Mask ventilation without difficulty Laryngoscope Size: Mac and 4 Grade View: Grade I Tube type: Oral Tube size: 7.5 mm Number of attempts: 1 Airway Equipment and Method: Stylet Placement Confirmation: ETT inserted through vocal cords under direct vision,  positive ETCO2 and breath sounds checked- equal and bilateral Secured at: 22 cm Tube secured with: Tape Dental Injury: Teeth and Oropharynx as per pre-operative assessment

## 2020-04-06 NOTE — Anesthesia Postprocedure Evaluation (Signed)
Anesthesia Post Note  Patient: Justin [name]  Procedure(s) Performed: Revision of spinal cord stimulator (N/A )     Patient location during evaluation: PACU Anesthesia Type: General Level of consciousness: awake and alert Pain management: pain level controlled Vital Signs Assessment: post-procedure vital signs reviewed and stable Respiratory status: spontaneous breathing, nonlabored ventilation, respiratory function stable and patient connected to nasal cannula oxygen Cardiovascular status: blood pressure returned to baseline and stable Postop Assessment: no apparent nausea or vomiting Anesthetic complications: no   No complications documented.  Last Vitals:  Vitals:   04/06/20 1533 04/06/20 1542  BP: 120/85 (!) 132/96  Pulse: 92 90  Resp: 16 10  Temp:  37.1 C  SpO2: 100% 99%    Last Pain:  Vitals:   04/06/20 1445  TempSrc:   PainSc: Asleep   Pain Goal:      LLE Sensation: Full sensation (04/06/20 1542)   RLE Sensation: Full sensation (04/06/20 1542)        Gaetano Romberger

## 2020-04-06 NOTE — H&P (Signed)
Here for SCS revision.   Leads are broken based on imaging and SCS rep.  Pain over 5/10, good benefit with system prior to break.  PMH: PE, OSA, H/O sdh  Surg: Lumbar spine surgery, achillies tendon repair, scs placement  Meds: reviewd (eliquis held X 7 days)  Allergies: NKDA  FH: [place]  Soc: no tob  PE  Vitals reviewed Alert, appropriate Good strength LE through out No sensory deficits detected Regular breathing pattern Regular pulse  A/P H/O SCS lead breakage  1. SCS replacement today, may need paddle lead based on ease of access, discussed with patient, who agrees.  2.  F/u in clinc 5 days wound check.  3. Post-operative pain medications.

## 2020-04-10 ENCOUNTER — Encounter (HOSPITAL_COMMUNITY): Payer: Self-pay | Admitting: Pain Medicine

## 2020-04-11 ENCOUNTER — Encounter (HOSPITAL_COMMUNITY): Payer: Self-pay | Admitting: Pain Medicine

## 2020-04-12 ENCOUNTER — Encounter (HOSPITAL_COMMUNITY): Payer: Self-pay | Admitting: Pain Medicine

## 2020-04-21 ENCOUNTER — Other Ambulatory Visit: Payer: Self-pay | Admitting: Internal Medicine

## 2020-04-22 LAB — CBC
HCT: 37.9 % — ABNORMAL LOW (ref 38.5–50.0)
Hemoglobin: 12.7 g/dL — ABNORMAL LOW (ref 13.2–17.1)
MCH: 27.4 pg (ref 27.0–33.0)
MCHC: 33.5 g/dL (ref 32.0–36.0)
MCV: 81.7 fL (ref 80.0–100.0)
MPV: 9.6 fL (ref 7.5–12.5)
Platelets: 283 10*3/uL (ref 140–400)
RBC: 4.64 10*6/uL (ref 4.20–5.80)
RDW: 14 % (ref 11.0–15.0)
WBC: 9.5 10*3/uL (ref 3.8–10.8)

## 2020-04-22 LAB — BASIC METABOLIC PANEL WITH GFR
BUN/Creatinine Ratio: 3 (calc) — ABNORMAL LOW (ref 6–22)
BUN: 5 mg/dL — ABNORMAL LOW (ref 7–25)
CO2: 26 mmol/L (ref 20–32)
Calcium: 9.2 mg/dL (ref 8.6–10.3)
Chloride: 107 mmol/L (ref 98–110)
Creat: 1.45 mg/dL — ABNORMAL HIGH (ref 0.70–1.33)
GFR, Est African American: 62 mL/min/{1.73_m2} (ref >=60)
GFR, Est Non African American: 53 mL/min/{1.73_m2} — ABNORMAL LOW (ref >=60)
Glucose, Bld: 121 mg/dL — ABNORMAL HIGH (ref 65–99)
Potassium: 4.4 mmol/L (ref 3.5–5.3)
Sodium: 141 mmol/L (ref 135–146)

## 2020-04-22 LAB — TSH: TSH: 1 mIU/L (ref 0.40–4.50)

## 2020-04-22 LAB — VITAMIN B12: Vitamin B-12: 243 pg/mL (ref 200–1100)

## 2020-04-22 LAB — FOLATE: Folate: 4.5 ng/mL — ABNORMAL LOW

## 2020-09-06 ENCOUNTER — Emergency Department (HOSPITAL_COMMUNITY): Payer: Medicaid Other

## 2020-09-06 ENCOUNTER — Emergency Department (HOSPITAL_COMMUNITY)
Admission: EM | Admit: 2020-09-06 | Discharge: 2020-09-06 | Discharge status: Against Medical Advice | Payer: Medicaid Other | Attending: Student | Admitting: Student

## 2020-09-06 ENCOUNTER — Other Ambulatory Visit: Payer: Self-pay

## 2020-09-06 DIAGNOSIS — R109 Unspecified abdominal pain: Secondary | ICD-10-CM | POA: Diagnosis not present

## 2020-09-06 DIAGNOSIS — R0689 Other abnormalities of breathing: Secondary | ICD-10-CM | POA: Insufficient documentation

## 2020-09-06 DIAGNOSIS — Z5321 Procedure and treatment not carried out due to patient leaving prior to being seen by health care provider: Secondary | ICD-10-CM | POA: Diagnosis not present

## 2020-09-06 LAB — BASIC METABOLIC PANEL
Anion gap: 8 (ref 5–15)
BUN: 5 mg/dL — ABNORMAL LOW (ref 6–20)
CO2: 26 mmol/L (ref 22–32)
Calcium: 8.8 mg/dL — ABNORMAL LOW (ref 8.9–10.3)
Chloride: 107 mmol/L (ref 98–111)
Creatinine, Ser: 1.41 mg/dL — ABNORMAL HIGH (ref 0.61–1.24)
GFR, Estimated: 58 mL/min — ABNORMAL LOW (ref >60)
Glucose, Bld: 138 mg/dL — ABNORMAL HIGH (ref 70–99)
Potassium: 3.7 mmol/L (ref 3.5–5.1)
Sodium: 141 mmol/L (ref 135–145)

## 2020-09-06 LAB — CBC
HCT: 37.7 % — ABNORMAL LOW (ref 39.0–52.0)
Hemoglobin: 12.3 g/dL — ABNORMAL LOW (ref 13.0–17.0)
MCH: 27.2 pg (ref 26.0–34.0)
MCHC: 32.6 g/dL (ref 30.0–36.0)
MCV: 83.2 fL (ref 80.0–100.0)
Platelets: 210 10*3/uL (ref 150–400)
RBC: 4.53 MIL/uL (ref 4.22–5.81)
RDW: 15 % (ref 11.5–15.5)
WBC: 9.5 10*3/uL (ref 4.0–10.5)
nRBC: 0 % (ref 0.0–0.2)

## 2020-09-06 LAB — URINALYSIS, ROUTINE W REFLEX MICROSCOPIC
Bilirubin Urine: NEGATIVE
Glucose, UA: NEGATIVE mg/dL
Hgb urine dipstick: NEGATIVE
Ketones, ur: NEGATIVE mg/dL
Leukocytes,Ua: NEGATIVE
Nitrite: NEGATIVE
Protein, ur: NEGATIVE mg/dL
Specific Gravity, Urine: 1.005 (ref 1.005–1.030)
pH: 6 (ref 5.0–8.0)

## 2020-09-06 MED ORDER — ACETAMINOPHEN 500 MG PO TABS: 1000.0000 mg | ORAL_TABLET | Freq: Once | ORAL | Status: DC

## 2020-09-06 NOTE — ED Notes (Signed)
Pt spoke with this tech about the wait time and said he felt he should need to wait longer and that he needs to go home to feed his son. Pt also said he would come back in the morning.

## 2020-09-06 NOTE — ED Provider Notes (Signed)
Emergency Medicine Provider Triage Evaluation Note  Justin [name] , a 55 y.o. male  was evaluated in triage.  Pt complains of right flank pain x2 days.  No urinary symptoms, no fevers or chills. worse with movement, deep breaths.  Review of Systems  Positive: Right flank pain exacerbated with movement Negative: Fevers, chills, urinary frequency/urgency/dysuria/hematuria  Physical Exam  BP 122/85 (BP Location: Left Arm)   Pulse 88   Temp 98.3 F (36.8 C) (Oral)   Resp 14   SpO2 96%  Gen:   Awake, no distress   Resp:  Normal effort  MSK:   Moves extremities without difficulty  Other:  Right CVAT without skill skeletal tenderness palpation.  No abdominal tenderness palpation.  Spinal stimulator palpable in the right lower back beneath the skin.  Medical Decision Making  Medically screening exam initiated at 3:19 PM.  Appropriate orders placed.  Justin [name] was informed that the remainder of the evaluation will be completed by another provider, this initial triage assessment does not replace that evaluation, and the importance of remaining in the ED until their evaluation is complete.  This chart was dictated using voice recognition software, Dragon. Despite the best efforts of this provider to proofread and correct errors, errors may still occur which can change documentation meaning.    Sherrilee Gilles 09/06/20 1528    Eber Hong, MD 09/07/20 1032

## 2020-09-06 NOTE — ED Triage Notes (Signed)
Pt reports R flank pain onset today, worse with deep breaths and movement. Denies urinary problems.

## 2020-09-06 NOTE — ED Notes (Signed)
Unable to locate patient in lobby or outside. No answer for room x3

## 2020-09-12 ENCOUNTER — Other Ambulatory Visit: Payer: Self-pay

## 2020-09-12 ENCOUNTER — Emergency Department (HOSPITAL_COMMUNITY)
Admission: EM | Admit: 2020-09-12 | Discharge: 2020-09-12 | Disposition: A | Discharge status: Home / Self Care | Payer: Medicaid Other | Attending: Emergency Medicine | Admitting: Emergency Medicine

## 2020-09-12 ENCOUNTER — Encounter (HOSPITAL_COMMUNITY): Payer: Self-pay

## 2020-09-12 DIAGNOSIS — G8929 Other chronic pain: Secondary | ICD-10-CM

## 2020-09-12 DIAGNOSIS — Z7901 Long term (current) use of anticoagulants: Secondary | ICD-10-CM | POA: Diagnosis not present

## 2020-09-12 DIAGNOSIS — M545 Low back pain, unspecified: Secondary | ICD-10-CM | POA: Diagnosis present

## 2020-09-12 DIAGNOSIS — M5441 Lumbago with sciatica, right side: Secondary | ICD-10-CM | POA: Diagnosis not present

## 2020-09-12 MED ORDER — OXYCODONE-ACETAMINOPHEN 5-325 MG PO TABS: 1.0000 | ORAL_TABLET | Freq: Four times a day (QID) | ORAL | 0 refills | Status: DC | PRN

## 2020-09-12 MED ORDER — CYCLOBENZAPRINE HCL 10 MG PO TABS: 10.0000 mg | ORAL_TABLET | Freq: Three times a day (TID) | ORAL | 0 refills | Status: DC | PRN

## 2020-09-12 NOTE — ED Provider Notes (Signed)
Emergency Medicine Provider Triage Evaluation Note  Justin [name] , a 55 y.o. male  was evaluated in triage.  Pt complains of back pain. Had shot to l4-l5 but pain is not improving.  Seen at [facility] last week for similar sxs but left prior to being seen.  Review of Systems  Positive: Back pain Negative: fever  Physical Exam  BP 120/90 (BP Location: Right Arm)   Pulse 86   Temp 98.2 F (36.8 C) (Oral)   Resp 18   SpO2 97%  Gen:   Awake, no distress   Resp:  Normal effort  MSK:   Moves extremities without difficulty  Ambulatory without difficulty  Medical Decision Making  Medically screening exam initiated at 2:05 PM.  Appropriate orders placed.  Justin [name] was informed that the remainder of the evaluation will be completed by another provider, this initial triage assessment does not replace that evaluation, and the importance of remaining in the ED until their evaluation is complete.     Justin Meres, PA-C 09/12/20 1414    Justin Logan, DO 09/15/20 1111

## 2020-09-12 NOTE — ED Provider Notes (Signed)
[facility] EMERGENCY DEPARTMENT Provider Note   CSN: [id number] Arrival date & time: 09/12/20  1313     History No chief complaint on file.   Justin RUSSI is a 55 y.o. male.  He is complaining of some lumbar back pain radiating to his right buttock and sometimes will shocking go down his leg.  Seems to be positional especially when he lays on his right side.  He said he had a fusion in the past and had an injection done a couple weeks ago by neurosurgery clinic that did not help.  No trauma.  No fevers or chills.  The history is provided by the patient.  Back Pain Location:  Lumbar spine and gluteal region Quality:  Aching and shooting Radiates to:  L posterior upper leg Pain severity:  Severe Pain is:  Same all the time Onset quality:  Gradual Duration:  2 weeks Timing:  Constant Progression:  Unchanged Chronicity:  Chronic Relieved by:  Nothing Worsened by:  Ambulation, bending and movement Ineffective treatments:  OTC medications Associated symptoms: no abdominal pain, no bladder incontinence, no bowel incontinence, no chest pain, no dysuria, no fever and no weakness       Past Medical History:  Diagnosis Date   Achilles rupture, left    Bipolar 1 disorder (HCC)    Dental caries    periodontitis   Pneumonia    Pulmonary embolism (HCC) 05/18/2007   Sleep apnea    wears CPAP   Subdural hematoma (HCC) 01/04/2013   in setting of supratherapeutic INR    Patient Active Problem List   Diagnosis Date Noted   Status post lumbar spinal fusion 01/03/2018   Lumbar stenosis 12/23/2017   Spondylolisthesis, lumbar region 06/25/2017   Achilles tendinitis of left lower extremity 12/07/2016   Achilles tendinitis, left leg 05/17/2016   Obstructive sleep apnea on CPAP 03/07/2015   Post-operative state 03/07/2015   Achilles rupture, left 10/21/2014   OSA (obstructive sleep apnea) 04/23/2013   Bipolar disorder, unspecified (HCC) 01/07/2013   Sinus  tachycardia 01/07/2013   Headache(784.0) 01/07/2013   SDH (subdural hematoma) (HCC) 01/04/2013   H/O PE 01/04/2013   Chronic anticoagulation 01/04/2013   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   Acute sinusitis 01/04/2013    Past Surgical History:  Procedure Laterality Date   ACHILLES TENDON SURGERY Left 10/21/2014   Procedure: Left Achilles Reconstruction;  Surgeon: Nadara Mustard, MD;  Location: [facility] OR;  Service: Orthopedics;  Laterality: Left;   APPENDECTOMY     CARDIAC CATHETERIZATION  03/04/2018   [facility]: Normal coronaries, LVEF estimated at 40%, medical Rx   CRANIOTOMY N/A 01/19/2013   Procedure: CRANIOTOMY HEMATOMA EVACUATION SUBDURAL;  Surgeon: Hewitt Shorts, MD;  Location: [facility] NEURO ORS;  Service: Neurosurgery;  Laterality: N/A;   CYSTECTOMY     right head   ELBOW SURGERY     right   FRACTURE SURGERY     finger   I & D EXTREMITY Left 12/07/2016   Procedure: LEFT ACHILLES DEBRIDEMENT;  Surgeon: Nadara Mustard, MD;  Location: [facility] OR;  Service: Orthopedics;  Laterality: Left;   LUMBAR FUSION  12/23/2017   L5 GILL PROCEDURE, RIGHT L5-S1, TRANSFORAMIAL LUMBAR INTERBODY FUSION, BILATERAL LATERAL FUSION, PEDICLE INSTRUMENTATION   MULTIPLE EXTRACTIONS WITH ALVEOLOPLASTY N/A 03/07/2015   Procedure: MULTIPLE EXTRACTION WITH ALVEOLOPLASTY;  Surgeon: [name], DDS;  Location: [facility] OR;  Service: Oral Surgery;  Laterality: N/A;   SPINAL CORD STIMULATOR INSERTION N/A 05/18/2019   Procedure: LUMBAR SPINAL  CORD STIMULATOR INSERTION;  Surgeon: Odette Fraction, MD;  Location: [facility] OR;  Service: Neurosurgery;  Laterality: N/A;  Thoracic/Lumbar   SPINAL CORD STIMULATOR INSERTION N/A 04/06/2020   Procedure: Revision of spinal cord stimulator;  Surgeon: Renaldo Fiddler, MD;  Location: [facility] OR;  Service: Neurosurgery;  Laterality: N/A;       Family History  Problem Relation Age of Onset   Hypertension Mother    Stroke Mother    Multiple sclerosis Sister    Down syndrome Son      Social History   Tobacco Use   Smoking status: Never   Smokeless tobacco: Never  Vaping Use   Vaping Use: Never used  Substance Use Topics   Alcohol use: No   Drug use: No    Home Medications Prior to Admission medications   Medication Sig Start Date End Date Taking? Authorizing Provider  acetaminophen (TYLENOL) 500 MG tablet Take 1 tablet (500 mg total) by mouth every 6 (six) hours as needed. Patient not taking: Reported on 02/24/2020 08/28/19   Michela Pitcher A, PA-C  doxepin (SINEQUAN) 75 MG capsule Take 75 mg by mouth at bedtime. 12/14/19   [provider]  ELIQUIS 5 MG TABS tablet Take 5 mg by mouth 2 (two) times daily. 07/14/19   [provider]  hydrOXYzine (ATARAX/VISTARIL) 25 MG tablet Take 25 mg by mouth at bedtime. 08/22/19   [provider]  lithium 600 MG capsule Take 600 mg by mouth at bedtime.    [provider]  lithium carbonate (ESKALITH) 450 MG CR tablet Take 450 mg by mouth daily. 12/14/19   [provider]  mirtazapine (REMERON) 45 MG tablet Take 45 mg by mouth at bedtime.    [provider]  QUEtiapine (SEROQUEL XR) 400 MG 24 hr tablet Take 800 mg by mouth at bedtime.    [provider]  tiZANidine (ZANAFLEX) 4 MG tablet Take 4 mg by mouth at bedtime.  12/17/19   [provider]    Allergies    Lyrica [pregabalin]  Review of Systems   Review of Systems  Constitutional:  Negative for fever.  HENT:  Negative for sore throat.   Respiratory:  Negative for shortness of breath.   Cardiovascular:  Negative for chest pain.  Gastrointestinal:  Negative for abdominal pain and bowel incontinence.  Genitourinary:  Negative for bladder incontinence and dysuria.  Musculoskeletal:  Positive for back pain.  Skin:  Negative for rash.  Neurological:  Negative for weakness.   Physical Exam Updated Vital Signs BP (!) 126/109 (BP Location: Right Arm)   Pulse 91   Temp 98.2 F (36.8 C) (Oral)   Resp 16    SpO2 100%   Physical Exam Vitals and nursing note reviewed.  Constitutional:      Appearance: Normal appearance. He is well-developed.  HENT:     Head: Normocephalic and atraumatic.  Eyes:     Conjunctiva/sclera: Conjunctivae normal.  Cardiovascular:     Rate and Rhythm: Normal rate and regular rhythm.     Heart sounds: No murmur heard. Pulmonary:     Effort: Pulmonary effort is normal. No respiratory distress.     Breath sounds: Normal breath sounds.  Abdominal:     Palpations: Abdomen is soft.     Tenderness: There is no abdominal tenderness. There is no guarding or rebound.  Musculoskeletal:        General: Tenderness present. No deformity.     Cervical back: Neck supple.  Comments: He is midline surgical scars.  He can feel his TENS unit in his right paralumbar area.  There are some tenderness through the right gluteal area.  Skin:    General: Skin is warm and dry.  Neurological:     General: No focal deficit present.     Mental Status: He is alert.    ED Results / Procedures / Treatments   Labs (all labs ordered are listed, but only abnormal results are displayed) Labs Reviewed - No data to display  EKG None  Radiology No results found.  Procedures Procedures   Medications Ordered in ED Medications - No data to display  ED Course  I have reviewed the triage vital signs and the nursing notes.  Pertinent labs & imaging results that were available during my care of the patient were reviewed by me and considered in my medical decision making (see chart for details).  Clinical Course as of 09/13/20 0954  Mon Sep 12, 2020  1800 Patient had labs and CT renal done on the 21st of this month that did not show any significant abnormalities.  Baseline creatinine and baseline hemoglobin.  UA was unremarkable.  We will treat with some pain medication and muscle relaxant and have follow-up with PCP. [MB]    Clinical Course User Index [MB] Terrilee Files, MD    MDM Rules/Calculators/A&P                         55 year old male with low back pain and sciatic symptoms, no red flags.  No recent trauma so imaging not indicated.  Labs unremarkable.  Will treat for symptomatic pain medication muscle relaxant and recommend close follow-up with PCP and treating back surgeon.  Return instructions discussed  Final Clinical Impression(s) / ED Diagnoses Final diagnoses:  Chronic right-sided low back pain with right-sided sciatica    Rx / DC Orders ED Discharge Orders          Ordered    oxyCODONE-acetaminophen (PERCOCET/ROXICET) 5-325 MG tablet  Every 6 hours PRN        09/12/20 1805    cyclobenzaprine (FLEXERIL) 10 MG tablet  3 times daily PRN        09/12/20 1805             Terrilee Files, MD 09/13/20 [phone]

## 2020-09-12 NOTE — Discharge Instructions (Addendum)
You are seen in the emergency department for worsening of your right-sided back pain with some radiation down your leg.  You had labs and imaging done 6 days ago that were unremarkable.  We are prescribing you some pain and muscle relaxants.  Please contact your primary care doctor and your back surgeon for further evaluation.

## 2020-09-12 NOTE — ED Notes (Signed)
RN reviewed discharge instructions w/ pt. Follow up, prescriptions and pain management reviewed. Pt had no further questions. °

## 2020-09-12 NOTE — ED Triage Notes (Signed)
Patient complains of ongoing lumbar back pain, has known disc disease and received injection at ortho 2 weeks ago. Patient denies any new trauma. Ambulatory to triage

## 2020-09-28 ENCOUNTER — Telehealth: Payer: Self-pay | Admitting: Pulmonary Disease

## 2020-09-28 NOTE — Telephone Encounter (Signed)
Called and spoke with patient. He stated that his current cpap machine is at least 57 years old and he is interested in getting a new machine. I explained to him the process of getting a machine if he has been compliant and has not had any breaks in therapy. He informed me that once he heard about the [employer] recall last summer, he stopped using his machine.   I advised him that since it has been more 6 months since he has used his machine, this is considered a break in therapy and his insurance and DME may require that he repeat the sleep study and start the process over again. 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 I advised him that I would check with the PCCs to see if Medicaid had started to allow Medicaid patients to have a HST.   I have a Teams messages to the PCCs. Will update chart once I hear back from them.

## 2020-10-13 ENCOUNTER — Other Ambulatory Visit: Payer: Self-pay | Admitting: Internal Medicine

## 2020-10-13 NOTE — Telephone Encounter (Signed)
Cherina, please advise on update from [facility]. Thanks.

## 2020-10-13 NOTE — Telephone Encounter (Signed)
Per PCCs, patients with Medicaid can have a HST now.

## 2020-10-14 LAB — CBC
HCT: 37.1 % — ABNORMAL LOW (ref 38.5–50.0)
Hemoglobin: 12.2 g/dL — ABNORMAL LOW (ref 13.2–17.1)
MCH: 27.9 pg (ref 27.0–33.0)
MCHC: 32.9 g/dL (ref 32.0–36.0)
MCV: 84.9 fL (ref 80.0–100.0)
MPV: 9.8 fL (ref 7.5–12.5)
Platelets: 241 10*3/uL (ref 140–400)
RBC: 4.37 10*6/uL (ref 4.20–5.80)
RDW: 15.5 % — ABNORMAL HIGH (ref 11.0–15.0)
WBC: 14.3 10*3/uL — ABNORMAL HIGH (ref 3.8–10.8)

## 2020-10-14 LAB — COMPLETE METABOLIC PANEL WITH GFR
AG Ratio: 2 (calc) (ref 1.0–2.5)
ALT: 27 U/L (ref 9–46)
AST: 25 U/L (ref 10–35)
Albumin: 4.4 g/dL (ref 3.6–5.1)
Alkaline phosphatase (APISO): 82 U/L (ref 35–144)
BUN/Creatinine Ratio: 4 (calc) — ABNORMAL LOW (ref 6–22)
BUN: 6 mg/dL — ABNORMAL LOW (ref 7–25)
CO2: 21 mmol/L (ref 20–32)
Calcium: 8.8 mg/dL (ref 8.6–10.3)
Chloride: 106 mmol/L (ref 98–110)
Creat: 1.51 mg/dL — ABNORMAL HIGH (ref 0.70–1.30)
Globulin: 2.2 g/dL (calc) (ref 1.9–3.7)
Glucose, Bld: 151 mg/dL — ABNORMAL HIGH (ref 65–99)
Potassium: 3.7 mmol/L (ref 3.5–5.3)
Sodium: 140 mmol/L (ref 135–146)
Total Bilirubin: 0.5 mg/dL (ref 0.2–1.2)
Total Protein: 6.6 g/dL (ref 6.1–8.1)
eGFR: 54 mL/min/{1.73_m2} — ABNORMAL LOW (ref >=60)

## 2020-10-14 LAB — LIPID PANEL
Cholesterol: 126 mg/dL (ref <200)
HDL: 50 mg/dL (ref >=40)
LDL Cholesterol (Calc): 61 mg/dL (calc)
Non-HDL Cholesterol (Calc): 76 mg/dL (calc) (ref <130)
Total CHOL/HDL Ratio: 2.5 (calc) (ref <5.0)
Triglycerides: 71 mg/dL (ref <150)

## 2020-10-14 LAB — PSA: PSA: 1.79 ng/mL (ref <=4.00)

## 2020-10-14 LAB — VITAMIN D 25 HYDROXY (VIT D DEFICIENCY, FRACTURES): Vit D, 25-Hydroxy: 20 ng/mL — ABNORMAL LOW (ref 30–100)

## 2020-10-14 LAB — TSH: TSH: 0.63 mIU/L (ref 0.40–4.50)

## 2020-10-20 NOTE — Telephone Encounter (Signed)
It has been a couple years since he was seen in office.  He needs to have a face to face visit first before ordering home sleep study.

## 2020-10-20 NOTE — Telephone Encounter (Signed)
Called and spoke to pt. Pt states he is now able to do an in lab study or a HST because he found a babysitter for his son. Pt has an upcoming OV with Dr. Craige Cotta on 8/19.   Dr. Craige Cotta, please advise if you would like pt to have a sleep study before seeing you. If so, which study. Thanks!

## 2020-10-20 NOTE — Telephone Encounter (Signed)
Called and spoke with pt letting him know info stated by VS and he verbalized understanding. Nothing further needed.

## 2020-11-04 ENCOUNTER — Other Ambulatory Visit: Payer: Self-pay

## 2020-11-04 ENCOUNTER — Ambulatory Visit (INDEPENDENT_AMBULATORY_CARE_PROVIDER_SITE_OTHER): Payer: Medicaid Other | Admitting: Pulmonary Disease

## 2020-11-04 ENCOUNTER — Encounter: Payer: Self-pay | Admitting: Pulmonary Disease

## 2020-11-04 VITALS — BP 110/86 | HR 87 | Temp 98.1°F | Ht 69.0 in | Wt 237.2 lb

## 2020-11-04 DIAGNOSIS — R0683 Snoring: Secondary | ICD-10-CM

## 2020-11-04 NOTE — Patient Instructions (Signed)
Will arrange for home sleep study Will call to arrange for follow up after sleep study reviewed  

## 2020-11-04 NOTE — Progress Notes (Signed)
[name] [facility], Critical Care, and Sleep Medicine  Chief Complaint  Patient presents with   Follow-up    Hadn't use because of the recall. Losing his breath when trying to use in 2018.    Constitutional:  BP 110/86   Pulse 87   Temp 98.1 F (36.7 C) (Oral)   Ht 5\' 9"  (1.753 m)   Wt 237 lb 3.2 oz (107.6 kg)   SpO2 97%   BMI 35.03 kg/m   Past Medical History:  PE, Bipolar, SDH s/p craniotomy 2014, PNA  Past Surgical History:  Justin [name]  has a past surgical history that includes Appendectomy; Cystectomy; Craniotomy (N/A, 01/19/2013); Elbow surgery; Fracture surgery; Achilles tendon surgery (Left, 10/21/2014); Multiple extractions with alveoloplasty (N/A, 03/07/2015); I & D extremity (Left, 12/07/2016); Lumbar fusion (12/23/2017); Spinal cord stimulator insertion (N/A, 05/18/2019); Cardiac catheterization (03/04/2018); and Spinal cord stimulator insertion (N/A, 04/06/2020).  Brief Summary:  Justin [name] is a 55 y.o. male with obstructive sleep apnea.      Subjective:   Justin [name] is primary caregiver for his son who has Down Syndrome.  Justin [name] has [employer]med machine from about 8 years ago.  Machine no longer functioning.  Justin [name] snores at night and wakes up feeling choked with a cough.  Justin [name] has trouble staying awake watching movies.  Physical Exam:   Appearance - well kempt   ENMT - no sinus tenderness, no oral exudate, no LAN, Mallampati 3 airway, no stridor, wears dentures  Respiratory - equal breath sounds bilaterally, no wheezing or rales  CV - s1s2 regular rate and rhythm, no murmurs  Ext - no clubbing, no edema  Skin - no rashes  Psych - normal mood and affect   Sleep Tests:  PSG 05/21/13 >> AHI 23 Auto CPAP 05/13/15 to 06/11/15 >> used on 16 of 30 nights with average 8 hrs 40 min.  Average AHI 3.5 with median CPAP 11 and 95 th percentile CPAP 16 cm H2O.  Cardiac Tests:  Echo 03/26/16 >> mild LVH, EF 40%, grade 1 DD  Social History:  Justin [name]  reports that Justin [name] has never smoked. Justin [name] has never  used smokeless tobacco. Justin [name] reports that Justin [name] does not drink alcohol and does not use drugs.  Family History:  His family history includes Down syndrome in his son; Hypertension in his mother; Multiple sclerosis in his sister; Stroke in his mother.    Discussion:  Justin [name] has snoring, sleep disruption, apnea, and daytime sleepiness.  Justin [name] has history of mood disorder.  His BMI is > 35.  Justin [name] has history of sleep apnea and I am concerned Justin [name] still has significant sleep apnea.  Assessment/Plan:   Snoring with history of obstructive sleep apnea. - will arrange for home sleep study to assess current status - assuming Justin [name] still has sleep apnea will then arrange for auto CPAP and follow up after that  Time Spent Involved in Patient Care on Day of Examination:  22 minutes  Follow up:   Patient Instructions  Will arrange for home sleep study Will call to arrange for follow up after sleep study reviewed  Medication List:   Allergies as of 11/04/2020       Reactions   Lyrica [pregabalin] Other (See Comments)   Hallucinations        Medication List        Accurate as of November 04, 2020 12:26 PM. If you have any questions, ask your nurse or doctor.          STOP  taking these medications    oxyCODONE-acetaminophen 5-325 MG tablet Commonly known as: PERCOCET/ROXICET Stopped by: Coralyn Helling, MD       TAKE these medications    acetaminophen 500 MG tablet Commonly known as: TYLENOL Take 1 tablet (500 mg total) by mouth every 6 (six) hours as needed.   cyclobenzaprine 10 MG tablet Commonly known as: FLEXERIL Take 1 tablet (10 mg total) by mouth 3 (three) times daily as needed for muscle spasms.   doxepin 75 MG capsule Commonly known as: SINEQUAN Take 75 mg by mouth at bedtime.   Eliquis 5 MG Tabs tablet Generic drug: apixaban Take 5 mg by mouth 2 (two) times daily.   hydrOXYzine 25 MG tablet Commonly known as: ATARAX/VISTARIL Take 25 mg by mouth at bedtime.   lithium 600 MG  capsule Take 600 mg by mouth at bedtime.   lithium carbonate 450 MG CR tablet Commonly known as: ESKALITH Take 450 mg by mouth daily.   mirtazapine 45 MG tablet Commonly known as: REMERON Take 45 mg by mouth at bedtime.   QUEtiapine 400 MG 24 hr tablet Commonly known as: SEROQUEL XR Take 800 mg by mouth at bedtime.   tiZANidine 4 MG tablet Commonly known as: ZANAFLEX Take 4 mg by mouth at bedtime.        Signature:  Coralyn Helling, MD [facility] Pulmonary/Critical Care Pager - [phone] 11/04/2020, 12:26 PM

## 2020-12-06 ENCOUNTER — Telehealth: Payer: Self-pay | Admitting: Pulmonary Disease

## 2020-12-07 NOTE — Telephone Encounter (Signed)
I called pt & made him aware we schedule studies in the order pt's come in.  His order was placed on 8/19 and now we have pulled pt's from around 8/11.  Told him we would get to him as soon as we can.  Nothing further needed.

## 2020-12-22 ENCOUNTER — Telehealth: Payer: Self-pay | Admitting: Pulmonary Disease

## 2020-12-23 NOTE — Telephone Encounter (Signed)
Reached out to pt to let him know he should be contacted in the next 10-14 days to get scheduled.  Pt verbalized understanding & states nothing further needed at this time.

## 2021-01-05 ENCOUNTER — Encounter (HOSPITAL_COMMUNITY): Payer: Self-pay | Admitting: Emergency Medicine

## 2021-01-05 ENCOUNTER — Emergency Department (HOSPITAL_COMMUNITY)
Admission: EM | Admit: 2021-01-05 | Discharge: 2021-01-05 | Disposition: A | Discharge status: Home / Self Care | Payer: Medicaid Other | Attending: Emergency Medicine | Admitting: Emergency Medicine

## 2021-01-05 ENCOUNTER — Emergency Department (HOSPITAL_COMMUNITY): Payer: Medicaid Other

## 2021-01-05 ENCOUNTER — Other Ambulatory Visit: Payer: Self-pay

## 2021-01-05 DIAGNOSIS — M545 Low back pain, unspecified: Secondary | ICD-10-CM | POA: Insufficient documentation

## 2021-01-05 DIAGNOSIS — Z7901 Long term (current) use of anticoagulants: Secondary | ICD-10-CM | POA: Diagnosis not present

## 2021-01-05 MED ORDER — HYDROMORPHONE HCL 1 MG/ML IJ SOLN
1.0000 mg | Freq: Once | INTRAMUSCULAR | Status: AC
  Administered 2021-01-05: 1 mg via INTRAMUSCULAR
  Filled 2021-01-05: qty 1

## 2021-01-05 MED ORDER — PREDNISONE 10 MG (21) PO TBPK: ORAL_TABLET | Freq: Every day | ORAL | 0 refills | Status: DC

## 2021-01-05 NOTE — ED Provider Notes (Signed)
Emergency Medicine Provider Triage Evaluation Note  Justin [name] , a 55 y.o. male  was evaluated in triage.  Pt complains of low back pain x2 days.  Patient has history of prior spinal fusion, also history of PE on Eliquis daily.  Denies any recent trauma to the lower back, no IV drug use, no history of cancer.  Denies any exacerbating factor, the pain increased out of nowhere.  Pain is worse at night..  Review of Systems  Positive: Back pain Negative: Urinary retention, saddle anesthesia, bilateral leg numbness.  Physical Exam  BP 139/90 (BP Location: Right Arm)   Pulse 95   Temp 98.5 F (36.9 C) (Oral)   Resp 16   SpO2 95%  Gen:   Awake, no distress   Resp:  Normal effort  MSK:   Moves extremities without difficulty.  Decreased mobility secondary to pain.   Other:  Sensation to light touch fully intact, positive straight leg raise to the right.,  Cranial nerves III through XII grossly intact.  Medical Decision Making  Medically screening exam initiated at 2:17 PM.  Appropriate orders placed.  Justin [name] was informed that the remainder of the evaluation will be completed by another provider, this initial triage assessment does not replace that evaluation, and the importance of remaining in the ED until their evaluation is complete.  History of spinal fusion, no recent visits to the ED.  We will start with CT lumbar to assess for any bony abnormalities.  Patient is not having any fevers, no history of IV drug use, no cancer history.  Doubt metastatic cancer or epidural abscess.   Justin Arista, PA-C 01/05/21 1419    Sloan Leiter, DO 01/06/21 2003

## 2021-01-05 NOTE — Discharge Instructions (Addendum)
Recommend following up with your spine surgery clinic.  Recommend trying course of steroids.  If you develop numbness, weakness, bladder or bowel incontinence, fever or other new concerning symptom, come back to ER for reassessment.

## 2021-01-05 NOTE — ED Triage Notes (Signed)
Pt complains of lower back pain into right hip/leg. Pt states pain makes it difficult for him to walk.This flare up of pain started two days ago.

## 2021-01-05 NOTE — ED Provider Notes (Signed)
[facility] EMERGENCY DEPARTMENT Provider Note   CSN: [id number] Arrival date & time: 01/05/21  1345     History Chief Complaint  Patient presents with   Back Pain    Justin [name] is a 55 y.o. male.  Presents to ER with concern for low back pain.  Patient has long history of chronic back pain, has previously undergone prior spinal fusion surgery.  States that over the past month he has been having worsening back pain, seems to be particularly worse over the past couple days.  Sharp, shooting pain radiating down right leg.  Worse with particular movements, improved with certain positions.  Up to 10 and 10 in severity.  Has not taken any pain medication for his pain today.  HPI     Past Medical History:  Diagnosis Date   Achilles rupture, left    Bipolar 1 disorder (HCC)    Dental caries    periodontitis   Pneumonia    Pulmonary embolism (HCC) 05/18/2007   Sleep apnea    wears CPAP   Subdural hematoma 01/04/2013   in setting of supratherapeutic INR    Patient Active Problem List   Diagnosis Date Noted   Status post lumbar spinal fusion 01/03/2018   Lumbar stenosis 12/23/2017   Spondylolisthesis, lumbar region 06/25/2017   Achilles tendinitis of left lower extremity 12/07/2016   Achilles tendinitis, left leg 05/17/2016   Obstructive sleep apnea on CPAP 03/07/2015   Post-operative state 03/07/2015   Achilles rupture, left 10/21/2014   OSA (obstructive sleep apnea) 04/23/2013   Bipolar disorder, unspecified (HCC) 01/07/2013   Sinus tachycardia 01/07/2013   Headache(784.0) 01/07/2013   SDH (subdural hematoma) 01/04/2013   H/O PE 01/04/2013   Chronic anticoagulation 01/04/2013   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   Acute sinusitis 01/04/2013    Past Surgical History:  Procedure Laterality Date   ACHILLES TENDON SURGERY Left 10/21/2014   Procedure: Left Achilles Reconstruction;  Surgeon: Nadara Mustard, MD;  Location: [facility] OR;  Service:  Orthopedics;  Laterality: Left;   APPENDECTOMY     CARDIAC CATHETERIZATION  03/04/2018   [facility]: Normal coronaries, LVEF estimated at 40%, medical Rx   CRANIOTOMY N/A 01/19/2013   Procedure: CRANIOTOMY HEMATOMA EVACUATION SUBDURAL;  Surgeon: Hewitt Shorts, MD;  Location: [facility] NEURO ORS;  Service: Neurosurgery;  Laterality: N/A;   CYSTECTOMY     right head   ELBOW SURGERY     right   FRACTURE SURGERY     finger   I & D EXTREMITY Left 12/07/2016   Procedure: LEFT ACHILLES DEBRIDEMENT;  Surgeon: Nadara Mustard, MD;  Location: [facility] OR;  Service: Orthopedics;  Laterality: Left;   LUMBAR FUSION  12/23/2017   L5 GILL PROCEDURE, RIGHT L5-S1, TRANSFORAMIAL LUMBAR INTERBODY FUSION, BILATERAL LATERAL FUSION, PEDICLE INSTRUMENTATION   MULTIPLE EXTRACTIONS WITH ALVEOLOPLASTY N/A 03/07/2015   Procedure: MULTIPLE EXTRACTION WITH ALVEOLOPLASTY;  Surgeon: [name], DDS;  Location: [facility] OR;  Service: Oral Surgery;  Laterality: N/A;   SPINAL CORD STIMULATOR INSERTION N/A 05/18/2019   Procedure: LUMBAR SPINAL CORD STIMULATOR INSERTION;  Surgeon: Odette Fraction, MD;  Location: [facility] OR;  Service: Neurosurgery;  Laterality: N/A;  Thoracic/Lumbar   SPINAL CORD STIMULATOR INSERTION N/A 04/06/2020   Procedure: Revision of spinal cord stimulator;  Surgeon: Renaldo Fiddler, MD;  Location: [facility] OR;  Service: Neurosurgery;  Laterality: N/A;       Family History  Problem Relation Age of Onset   Hypertension Mother    Stroke  Mother    Multiple sclerosis Sister    Down syndrome Son     Social History   Tobacco Use   Smoking status: Never   Smokeless tobacco: Never  Vaping Use   Vaping Use: Never used  Substance Use Topics   Alcohol use: No   Drug use: No    Home Medications Prior to Admission medications   Medication Sig Start Date End Date Taking? Authorizing Provider  predniSONE (STERAPRED UNI-PAK 21 TAB) 10 MG (21) TBPK tablet Take by mouth daily. Take 6 tabs by mouth daily  for 1 day, then 5  tabs for 1 day, then 4 tabs for 1 day, then 3 tabs for 1 day, 2 tabs for 1 day, then 1 tab by mouth daily for 1 day 01/05/21  Yes Modell Fendrick, Quitman Livings, MD  acetaminophen (TYLENOL) 500 MG tablet Take 1 tablet (500 mg total) by mouth every 6 (six) hours as needed. Patient not taking: Reported on 02/24/2020 08/28/19   Michela Pitcher A, PA-C  cyclobenzaprine (FLEXERIL) 10 MG tablet Take 1 tablet (10 mg total) by mouth 3 (three) times daily as needed for muscle spasms. 09/12/20   Terrilee Files, MD  doxepin (SINEQUAN) 75 MG capsule Take 75 mg by mouth at bedtime. 12/14/19   [provider]  ELIQUIS 5 MG TABS tablet Take 5 mg by mouth 2 (two) times daily. 07/14/19   [provider]  hydrOXYzine (ATARAX/VISTARIL) 25 MG tablet Take 25 mg by mouth at bedtime. 08/22/19   [provider]  lithium 600 MG capsule Take 600 mg by mouth at bedtime.    [provider]  lithium carbonate (ESKALITH) 450 MG CR tablet Take 450 mg by mouth daily. 12/14/19   [provider]  mirtazapine (REMERON) 45 MG tablet Take 45 mg by mouth at bedtime.    [provider]  QUEtiapine (SEROQUEL XR) 400 MG 24 hr tablet Take 800 mg by mouth at bedtime.    [provider]  tiZANidine (ZANAFLEX) 4 MG tablet Take 4 mg by mouth at bedtime.  12/17/19   [provider]    Allergies    Lyrica [pregabalin]  Review of Systems   Review of Systems  Constitutional:  Negative for chills and fever.  HENT:  Negative for ear pain and sore throat.   Eyes:  Negative for pain and visual disturbance.  Respiratory:  Negative for cough and shortness of breath.   Cardiovascular:  Negative for chest pain and palpitations.  Gastrointestinal:  Negative for abdominal pain and vomiting.  Genitourinary:  Negative for dysuria and hematuria.  Musculoskeletal:  Positive for arthralgias and back pain.  Skin:  Negative for color change and rash.  Neurological:  Negative for seizures and syncope.   All other systems reviewed and are negative.  Physical Exam Updated Vital Signs BP 139/90 (BP Location: Right Arm)   Pulse 95   Temp 98.5 F (36.9 C) (Oral)   Resp 16   SpO2 95%   Physical Exam Vitals and nursing note reviewed.  Constitutional:      Appearance: He is well-developed.  HENT:     Head: Normocephalic and atraumatic.  Eyes:     Conjunctiva/sclera: Conjunctivae normal.  Cardiovascular:     Rate and Rhythm: Normal rate and regular rhythm.     Heart sounds: No murmur heard. Pulmonary:     Effort: Pulmonary effort is normal. No respiratory distress.     Breath sounds: Normal breath sounds.  Abdominal:  Palpations: Abdomen is soft.     Tenderness: There is no abdominal tenderness.  Musculoskeletal:     Cervical back: Neck supple.     Comments: Some tenderness across lumbar region, worse in right lumbar region, no step-off or deformity appreciated  Skin:    General: Skin is warm and dry.  Neurological:     Mental Status: He is alert.     Comments: 5 out of 5 strength in bilateral lower extremities, sensation to light touch intact in lower extremities    ED Results / Procedures / Treatments   Labs (all labs ordered are listed, but only abnormal results are displayed) Labs Reviewed - No data to display  EKG None  Radiology No results found.  Procedures Procedures   Medications Ordered in ED Medications  HYDROmorphone (DILAUDID) injection 1 mg (has no administration in time range)    ED Course  I have reviewed the triage vital signs and the nursing notes.  Pertinent labs & imaging results that were available during my care of the patient were reviewed by me and considered in my medical decision making (see chart for details).    MDM Rules/Calculators/A&P                         55 year old man presents to ER with concern for acute on chronic back pain.  On exam he appears well in no distress with normal vitals.  Neuro intact.  No trauma.  CT  negative for acute process.  Has stable findings.  Will provide some symptomatic control in ER.  Believe he is appropriate for discharge and outpatient management and follow-up with his spine specialist.  Will recommend course of steroids.  After the discussed management above, the patient was determined to be safe for discharge.  The patient was in agreement with this plan and all questions regarding their care were answered.  ED return precautions were discussed and the patient will return to the ED with any significant worsening of condition.    Final Clinical Impression(s) / ED Diagnoses Final diagnoses:  Low back pain, unspecified back pain laterality, unspecified chronicity, unspecified whether sciatica present    Rx / DC Orders ED Discharge Orders          Ordered    predniSONE (STERAPRED UNI-PAK 21 TAB) 10 MG (21) TBPK tablet  Daily        01/05/21 1618             Milagros Loll, MD 01/05/21 1622

## 2021-01-06 ENCOUNTER — Ambulatory Visit: Payer: Medicaid Other

## 2021-01-06 DIAGNOSIS — R0683 Snoring: Secondary | ICD-10-CM

## 2021-01-06 DIAGNOSIS — G4733 Obstructive sleep apnea (adult) (pediatric): Secondary | ICD-10-CM

## 2021-01-09 ENCOUNTER — Telehealth: Payer: Self-pay | Admitting: Pulmonary Disease

## 2021-01-09 DIAGNOSIS — G4733 Obstructive sleep apnea (adult) (pediatric): Secondary | ICD-10-CM

## 2021-01-09 NOTE — Telephone Encounter (Signed)
HST 01/08/21 >> AHI 13.3, SpO2 low 77%.   Please let him know his sleep study shows mild obstructive sleep apnea.  Please send order to arrange for auto CPAP set up with pressure range 5 to 15 cm H2O with heated humidity and mask of choice.  He needs ROV with me or NP 2 to 3 months after getting set up on CPAP.

## 2021-01-11 NOTE — Telephone Encounter (Signed)
Spoke with patient  regarding HST results. They verbalized understanding. No further questions.  Nothing further needed at this time.   Order placed for cpap set up

## 2021-01-19 ENCOUNTER — Ambulatory Visit (INDEPENDENT_AMBULATORY_CARE_PROVIDER_SITE_OTHER): Payer: Medicaid Other | Admitting: Podiatry

## 2021-01-19 ENCOUNTER — Encounter: Payer: Self-pay | Admitting: Podiatry

## 2021-01-19 ENCOUNTER — Other Ambulatory Visit: Payer: Self-pay

## 2021-01-19 DIAGNOSIS — M79675 Pain in left toe(s): Secondary | ICD-10-CM

## 2021-01-19 DIAGNOSIS — M79674 Pain in right toe(s): Secondary | ICD-10-CM

## 2021-01-19 DIAGNOSIS — B351 Tinea unguium: Secondary | ICD-10-CM

## 2021-01-19 DIAGNOSIS — M7662 Achilles tendinitis, left leg: Secondary | ICD-10-CM

## 2021-01-20 NOTE — Progress Notes (Signed)
Subjective:   Patient ID: [name], male   DOB: 55 y.o.   MRN: [id number]   HPI Patient presents with extremely thickened elongated nailbeds 1-5 both feet with several of them on both feet which become very painful and make shoe gear difficult and he is wondering what can be done long-term.  Patient does not smoke likes to be active   Review of Systems  All other systems reviewed and are negative.      Objective:  Physical Exam Vitals and nursing note reviewed.  Constitutional:      Appearance: He is well-developed.  Pulmonary:     Effort: Pulmonary effort is normal.  Musculoskeletal:        General: Normal range of motion.  Skin:    General: Skin is warm.  Neurological:     Mental Status: He is alert.    Neurovascular status intact muscle strength found to be adequate with patient noted to have history of Achilles tendon rupture history of Achilles tendinitis and does take blood thinner.  His nails are extremely thickened 1-5 both feet dystrophic and painful when pressed and he has had surgery on his left lower leg but appears to have normal muscle strength.  Good digital perfusion well oriented     Assessment:  Severe mycotic nail infection with pain 1-5 both feet history of Achilles tendon rupture left     Plan:  NP reviewed condition discussed treatment options.  We could consider removal and I educated him on this but at this point we will debride painful nailbeds 1-5 both feet see the results and decide what else may be of benefit to him.  Encouraged to call questions concerns

## 2021-03-19 ENCOUNTER — Emergency Department (HOSPITAL_COMMUNITY): Payer: Medicaid Other

## 2021-03-19 ENCOUNTER — Emergency Department (HOSPITAL_COMMUNITY)
Admission: EM | Admit: 2021-03-19 | Discharge: 2021-03-19 | Disposition: A | Discharge status: Home / Self Care | Payer: Medicaid Other | Attending: Emergency Medicine | Admitting: Emergency Medicine

## 2021-03-19 ENCOUNTER — Other Ambulatory Visit: Payer: Self-pay

## 2021-03-19 ENCOUNTER — Encounter (HOSPITAL_COMMUNITY): Payer: Self-pay | Admitting: Emergency Medicine

## 2021-03-19 DIAGNOSIS — Z7901 Long term (current) use of anticoagulants: Secondary | ICD-10-CM | POA: Diagnosis not present

## 2021-03-19 DIAGNOSIS — R0602 Shortness of breath: Secondary | ICD-10-CM | POA: Diagnosis present

## 2021-03-19 DIAGNOSIS — Z8616 Personal history of COVID-19: Secondary | ICD-10-CM | POA: Diagnosis not present

## 2021-03-19 DIAGNOSIS — R079 Chest pain, unspecified: Secondary | ICD-10-CM

## 2021-03-19 DIAGNOSIS — J90 Pleural effusion, not elsewhere classified: Secondary | ICD-10-CM | POA: Diagnosis not present

## 2021-03-19 DIAGNOSIS — R911 Solitary pulmonary nodule: Secondary | ICD-10-CM | POA: Insufficient documentation

## 2021-03-19 LAB — BASIC METABOLIC PANEL
Anion gap: 8 (ref 5–15)
BUN: 5 mg/dL — ABNORMAL LOW (ref 6–20)
CO2: 26 mmol/L (ref 22–32)
Calcium: 8.9 mg/dL (ref 8.9–10.3)
Chloride: 106 mmol/L (ref 98–111)
Creatinine, Ser: 1.29 mg/dL — ABNORMAL HIGH (ref 0.61–1.24)
GFR, Estimated: >60 mL/min (ref >60)
Glucose, Bld: 183 mg/dL — ABNORMAL HIGH (ref 70–99)
Potassium: 4 mmol/L (ref 3.5–5.1)
Sodium: 140 mmol/L (ref 135–145)

## 2021-03-19 LAB — CBC WITH DIFFERENTIAL/PLATELET
Abs Immature Granulocytes: 0.04 10*3/uL (ref 0.00–0.07)
Basophils Absolute: 0.1 10*3/uL (ref 0.0–0.1)
Basophils Relative: 1 %
Eosinophils Absolute: 0.2 10*3/uL (ref 0.0–0.5)
Eosinophils Relative: 2 %
HCT: 38.6 % — ABNORMAL LOW (ref 39.0–52.0)
Hemoglobin: 12.5 g/dL — ABNORMAL LOW (ref 13.0–17.0)
Immature Granulocytes: 1 %
Lymphocytes Relative: 24 %
Lymphs Abs: 1.9 10*3/uL (ref 0.7–4.0)
MCH: 28.5 pg (ref 26.0–34.0)
MCHC: 32.4 g/dL (ref 30.0–36.0)
MCV: 88.1 fL (ref 80.0–100.0)
Monocytes Absolute: 0.9 10*3/uL (ref 0.1–1.0)
Monocytes Relative: 11 %
Neutro Abs: 4.8 10*3/uL (ref 1.7–7.7)
Neutrophils Relative %: 61 %
Platelets: 272 10*3/uL (ref 150–400)
RBC: 4.38 MIL/uL (ref 4.22–5.81)
RDW: 15 % (ref 11.5–15.5)
WBC: 7.8 10*3/uL (ref 4.0–10.5)
nRBC: 0 % (ref 0.0–0.2)

## 2021-03-19 LAB — BRAIN NATRIURETIC PEPTIDE: B Natriuretic Peptide: 59 pg/mL (ref 0.0–100.0)

## 2021-03-19 LAB — HEPATIC FUNCTION PANEL
ALT: 18 U/L (ref 0–44)
AST: 19 U/L (ref 15–41)
Albumin: 3.9 g/dL (ref 3.5–5.0)
Alkaline Phosphatase: 98 U/L (ref 38–126)
Bilirubin, Direct: <0.1 mg/dL (ref 0.0–0.2)
Total Bilirubin: 0.6 mg/dL (ref 0.3–1.2)
Total Protein: 7.2 g/dL (ref 6.5–8.1)

## 2021-03-19 LAB — TROPONIN I (HIGH SENSITIVITY)
Troponin I (High Sensitivity): 23 ng/L — ABNORMAL HIGH (ref <18)
Troponin I (High Sensitivity): 18 ng/L — ABNORMAL HIGH (ref <18)

## 2021-03-19 MED ORDER — ACETAMINOPHEN 500 MG PO TABS: 1000.0000 mg | ORAL_TABLET | Freq: Once | ORAL | Status: DC

## 2021-03-19 MED ORDER — IOHEXOL 350 MG/ML SOLN
100.0000 mL | Freq: Once | INTRAVENOUS | Status: AC | PRN
  Administered 2021-03-19: 100 mL via INTRAVENOUS

## 2021-03-19 MED ORDER — OXYCODONE-ACETAMINOPHEN 5-325 MG PO TABS: 1.0000 | ORAL_TABLET | Freq: Three times a day (TID) | ORAL | 0 refills | Status: DC | PRN

## 2021-03-19 MED ORDER — AZITHROMYCIN 250 MG PO TABS: 250.0000 mg | ORAL_TABLET | Freq: Every day | ORAL | 0 refills | Status: DC

## 2021-03-19 MED ORDER — OXYCODONE-ACETAMINOPHEN 5-325 MG PO TABS
1.0000 | ORAL_TABLET | Freq: Once | ORAL | Status: AC
  Administered 2021-03-19: 1 via ORAL
  Filled 2021-03-19: qty 1

## 2021-03-19 NOTE — Discharge Instructions (Addendum)
I have prescribed you a strong narcotic called Percocet. Please only take this as prescribed. This medication also has tylenol in it, so please be sure you are not taking more than 3000 mg of tylenol per day. Do not drive or operate heavy machinery after taking this medication. Do not mix it with alcohol.   I am also prescribing you an antibiotic called azithromycin.  Please only take this as prescribed.  Please make sure you take the full course of this medication.  Do not stop taking it early.  Please follow-up with your regular doctor regarding your pulmonary nodule.  It is important you get a repeat CT scan of this nodule in 3 months.  If you develop any new or worsening symptoms please come back to the emergency department immediately.

## 2021-03-19 NOTE — ED Provider Notes (Signed)
[name] Provider Note   CSN: [id number] Arrival date & time: 03/19/21  1017     History  Chief Complaint  Patient presents with   Shortness of Breath   Chest Pain    [name] is a 56 y.o. male.  HPI Patient is a 56 year old male with history of subdural hematoma, OSA on CPAP, PE, who presents to the emergency department due to chest pain and shortness of breath.  States that he has been experiencing a right-sided sharp chest pain with deep inspiration for the past 1.5 weeks.  It has been constant.  Does not improve when changing positions.  Reports mild shortness of breath due to this.  No nausea, vomiting, diaphoresis, cough, rhinorrhea, sore throat.  States he is anticoagulated on Eliquis and denies any missed doses.  States he recently had a COVID-19 infection in November of this year.  Denies any history of CHF.  Denies any history of smoking.    Home Medications Prior to Admission medications   Medication Sig Start Date End Date Taking? Authorizing Provider  azithromycin (ZITHROMAX) 250 MG tablet Take 1 tablet (250 mg total) by mouth daily. Take first 2 tablets together, then 1 every day until finished. 03/19/21  Yes Rayna Sexton, PA-C  oxyCODONE-acetaminophen (PERCOCET/ROXICET) 5-325 MG tablet Take 1 tablet by mouth every 8 (eight) hours as needed for severe pain. 03/19/21  Yes Rayna Sexton, PA-C  acetaminophen (TYLENOL) 500 MG tablet Take 1 tablet (500 mg total) by mouth every 6 (six) hours as needed. Patient not taking: Reported on 02/24/2020 08/28/19   Rodell Perna A, PA-C  cyclobenzaprine (FLEXERIL) 10 MG tablet Take 1 tablet (10 mg total) by mouth 3 (three) times daily as needed for muscle spasms. 09/12/20   Hayden Rasmussen, MD  doxepin (SINEQUAN) 75 MG capsule Take 75 mg by mouth at bedtime. 12/14/19   [provider]  ELIQUIS 5 MG TABS tablet Take 5 mg by mouth 2 (two) times daily. 07/14/19   [provider]   hydrOXYzine (ATARAX/VISTARIL) 25 MG tablet Take 25 mg by mouth at bedtime. 08/22/19   [provider]  lithium 600 MG capsule Take 600 mg by mouth at bedtime.    [provider]  lithium carbonate (ESKALITH) 450 MG CR tablet Take 450 mg by mouth daily. 12/14/19   [provider]  mirtazapine (REMERON) 45 MG tablet Take 45 mg by mouth at bedtime.    [provider]  predniSONE (STERAPRED UNI-PAK 21 TAB) 10 MG (21) TBPK tablet Take by mouth daily. Take 6 tabs by mouth daily  for 1 day, then 5 tabs for 1 day, then 4 tabs for 1 day, then 3 tabs for 1 day, 2 tabs for 1 day, then 1 tab by mouth daily for 1 day 01/05/21   Lucrezia Starch, MD  QUEtiapine (SEROQUEL XR) 400 MG 24 hr tablet Take 800 mg by mouth at bedtime.    [provider]  tiZANidine (ZANAFLEX) 4 MG tablet Take 4 mg by mouth at bedtime.  12/17/19   [provider]      Allergies    Lyrica [pregabalin]    Review of Systems   Review of Systems  All other systems reviewed and are negative. Ten systems reviewed and are negative for acute change, except as noted in the HPI.   Physical Exam Updated Vital Signs BP (!) 155/116    Pulse 95    Temp 98.9 F (37.2 C) (  Oral)    Resp 13    SpO2 99%  Physical Exam Vitals and nursing note reviewed.  Constitutional:      General: He is not in acute distress.    Appearance: Normal appearance. He is well-developed. He is not ill-appearing, toxic-appearing or diaphoretic.  HENT:     Head: Normocephalic and atraumatic.     Right Ear: External ear normal.     Left Ear: External ear normal.     Nose: Nose normal.     Mouth/Throat:     Mouth: Mucous membranes are moist.     Pharynx: Oropharynx is clear. No oropharyngeal exudate or posterior oropharyngeal erythema.  Eyes:     Extraocular Movements: Extraocular movements intact.  Cardiovascular:     Rate and Rhythm: Normal rate and regular rhythm.     Pulses: Normal pulses.     Heart sounds:  Normal heart sounds. No murmur heard.   No friction rub. No gallop.  Pulmonary:     Effort: Pulmonary effort is normal. No respiratory distress.     Breath sounds: No stridor. Examination of the right-lower field reveals rales. Examination of the left-lower field reveals rales. Rales present. No decreased breath sounds, wheezing or rhonchi.  Abdominal:     General: Abdomen is flat.     Tenderness: There is no abdominal tenderness.  Musculoskeletal:        General: Normal range of motion.     Cervical back: Normal range of motion and neck supple. No tenderness.     Right lower leg: No edema.     Left lower leg: No edema.     Comments: No pedal edema.  Skin:    General: Skin is warm and dry.  Neurological:     General: No focal deficit present.     Mental Status: He is alert and oriented to person, place, and time.  Psychiatric:        Mood and Affect: Mood normal.        Behavior: Behavior normal.   ED Results / Procedures / Treatments   Labs (all labs ordered are listed, but only abnormal results are displayed) Labs Reviewed  BASIC METABOLIC PANEL - Abnormal; Notable for the following components:      Result Value   Glucose, Bld 183 (*)    BUN 5 (*)    Creatinine, Ser 1.29 (*)    All other components within normal limits  CBC WITH DIFFERENTIAL/PLATELET - Abnormal; Notable for the following components:   Hemoglobin 12.5 (*)    HCT 38.6 (*)    All other components within normal limits  TROPONIN I (HIGH SENSITIVITY) - Abnormal; Notable for the following components:   Troponin I (High Sensitivity) 23 (*)    All other components within normal limits  TROPONIN I (HIGH SENSITIVITY) - Abnormal; Notable for the following components:   Troponin I (High Sensitivity) 18 (*)    All other components within normal limits  BRAIN NATRIURETIC PEPTIDE  HEPATIC FUNCTION PANEL   EKG EKG Interpretation  Date/Time:  Sunday March 19 2021 10:13:02 EST Ventricular Rate:  103 PR  Interval:  170 QRS Duration: 96 QT Interval:  406 QTC Calculation: 531 R Axis:   -24 Text Interpretation: Sinus tachycardia Right atrial enlargement Nonspecific T wave abnormality Prolonged QT Abnormal ECG When compared with ECG of 03-Jan-2020 14:18, PREVIOUS ECG IS PRESENT Confirmed by Dene Gentry [phone]) on 03/19/2021 3:51:28 PM  Radiology DG Chest 2 View  Result Date: 03/19/2021 CLINICAL DATA:  Shortness of breath and right chest pain for 1 week. EXAM: CHEST - 2 VIEW COMPARISON:  January 03, 2020, May 07, 2018 FINDINGS: The heart size and mediastinal contours are stable. The aorta is tortuous. The heart size mildly enlarged. Mild patchy opacities identified in the left lung base. There is probable minimal right pleural effusion. There is no pulmonary edema. The visualized skeletal structures are stable. IMPRESSION: Mild patchy opacity identified in the left lung base, early developing pneumonia is not excluded. Electronically Signed   By: Sherian Rein M.D.   On: 03/19/2021 11:09   CT Angio Chest PE W and/or Wo Contrast  Result Date: 03/19/2021 CLINICAL DATA:  57 year old male with suspected pulmonary embolism. EXAM: CT ANGIOGRAPHY CHEST WITH CONTRAST TECHNIQUE: Multidetector CT imaging of the chest was performed using the standard protocol during bolus administration of intravenous contrast. Multiplanar CT image reconstructions and MIPs were obtained to evaluate the vascular anatomy. CONTRAST:  OMNIPAQUE IOHEXOL 350 MG/ML SOLN COMPARISON:  Chest CTA 10/21/2018 FINDINGS: Cardiovascular: No filling defects within the pulmonary arterial tree to suggest pulmonary embolism. Heart size is mildly enlarged. There is no significant pericardial fluid, thickening or pericardial calcification. There is aortic atherosclerosis, as well as atherosclerosis of the great vessels of the mediastinum and the coronary arteries, including calcified atherosclerotic plaque in the left anterior descending and left  circumflex coronary arteries. Mediastinum/Nodes: No pathologically enlarged mediastinal or hilar lymph nodes. Esophagus is unremarkable in appearance. No axillary lymphadenopathy. Lungs/Pleura: Pulmonary nodule in the lateral segment of the right middle lobe (axial image 78 of series 6) measuring 1.1 x 1.6 x 1.8 cm. Widespread areas of ground-glass attenuation and mild interlobular septal thickening in the lungs bilaterally, suggesting a background of mild interstitial pulmonary edema. Trace right pleural effusion. No left pleural effusion. Upper Abdomen: Aortic atherosclerosis. Diffuse low attenuation throughout the visualized hepatic parenchyma, indicative of hepatic steatosis. Musculoskeletal: There are no aggressive appearing lytic or blastic lesions noted in the visualized portions of the skeleton. Spinal cord stimulator with tip in the midthoracic region. Review of the MIP images confirms the above findings. IMPRESSION: 1. No evidence of pulmonary embolism. 2. The appearance of the chest suggests mild congestive heart failure, including mild cardiomegaly, evidence of mild interstitial pulmonary edema in the lungs and trace right pleural effusion. 3. 1.1 x 1.6 x 1.8 cm nodule in the lateral segment of the right middle lobe. This could be of infectious or inflammatory etiology, however, underlying neoplasm is not excluded. Consider one of the following in 3 months for both low-risk and high-risk individuals: (a) repeat chest CT or (b) follow-up PET-CT. This recommendation follows the consensus statement: Guidelines for Management of Incidental Pulmonary Nodules Detected on CT Images: From the Fleischner Society 2017; Radiology 2017; 284:228-243. 4. Aortic atherosclerosis, in addition to 2 vessel coronary artery disease. Please note that although the presence of coronary artery calcium documents the presence of coronary artery disease, the severity of this disease and any potential stenosis cannot be assessed on  this non-gated CT examination. Assessment for potential risk factor modification, dietary therapy or pharmacologic therapy may be warranted, if clinically indicated. 5. Hepatic steatosis. Aortic Atherosclerosis (ICD10-I70.0). Electronically Signed   By: Trudie Reed M.D.   On: 03/19/2021 14:25    Procedures Procedures   Medications Ordered in ED Medications  oxyCODONE-acetaminophen (PERCOCET/ROXICET) 5-325 MG per tablet 1 tablet (1 tablet Oral Given 03/19/21 1314)  iohexol (OMNIPAQUE) 350 MG/ML injection 100 mL (100 mLs Intravenous Contrast Given 03/19/21 1407)  ED Course/ Medical Decision Making/ A&P                           Medical Decision Making  Pt is a 56 y.o. male who presents to the ED due to right sided CP with deep inspiration for the past 1.5 weeks.   Labs: CBC with a hemoglobin of 12.5 and hematocrit of 38.6. BMP with a glucose of 183, BUN of 5, creatinine of 1.29. BNP of 59. Hepatic function panel within normal limits. Troponin of 23 with a repeat of 18.  Imaging: Chest x-ray concerning for a mild patchy opacity in the left lung base, early developing pneumonia is not excluded. CTA of the chest shows IMPRESSION: 1. No evidence of pulmonary embolism. 2. The appearance of the chest suggests mild congestive heart failure, including mild cardiomegaly, evidence of mild interstitial pulmonary edema in the lungs and trace right pleural effusion. 3. 1.1 x 1.6 x 1.8 cm nodule in the lateral segment of the right middle lobe. This could be of infectious or inflammatory etiology, however, underlying neoplasm is not excluded. Consider one of the following in 3 months for both low-risk and high-risk individuals: (a) repeat chest CT or (b) follow-up PET-CT. This recommendation follows the consensus statement: Guidelines for Management of Incidental Pulmonary Nodules Detected on CT Images: From the Fleischner Society 2017; Radiology 2017; 284:228-243. 4. Aortic atherosclerosis, in addition  to 2 vessel coronary artery disease. Please note that although the presence of coronary artery calcium documents the presence of coronary artery disease, the severity of this disease and any potential stenosis cannot be assessed on this non-gated CT examination. Assessment for potential risk factor modification, dietary therapy or pharmacologic therapy may be warranted, if clinically indicated. 5. Hepatic steatosis. Aortic Atherosclerosis (ICD10-I70.0).  I, Rayna Sexton, PA-C, personally reviewed and evaluated these images and lab results as part of my medical decision-making.  CTA of the chest with findings as noted above.  Patient is anticoagulated on Eliquis and denies any missed doses.  CTA appears negative for PE.  There is concern for mild cardiomegaly as well as evidence of mild interstitial pulmonary edema in the lungs and a trace right pleural effusion.  On my exam patient has rales in the bilateral lung bases.  Right slightly greater than left.  They also noted a 1.1 x 1.6 x 1.8 cm nodule in the lateral segment of the right middle lobe.  They are concerned this could be infectious or inflammatory in etiology.  Underlying neoplasm is not excluded.  This was discussed with the patient.  Recommended follow-up CT imaging in 3 months.  He is going to discuss this with his PCP.   Patient has no pedal edema on my exam.  BNP within normal limits at 57.  Does not appear to be consistent with new onset CHF.  Troponins are mildly elevated but flat.  Appear to be consistent with his baseline values.  History, lab work, and ECG does not appear to be consistent with ACS at this time.  Patient denies any URI symptoms.  CBC without leukocytosis.  Does not appear to be consistent with PNA or a URI.  But given his recent COVID-19 infection November will discharge on a course of azithromycin.  We will also provide a short course of Percocet for his pain.  We discussed safety regarding this medication.  Feel that  the patient is stable for discharge at this time and he is agreeable.  Recommended PCP follow-up.  Discussed the pulmonary nodule on patient's CT scan and he understands to follow-up with his PCP regarding this and have follow-up imaging obtained in 3 months.  He was given strict return precautions and understands to return to the emergency department with any new or worsening symptoms.  His questions were answered and he was amicable at the time of discharge.  Patient discussed with my attending physician Dr. Dene Gentry who is in agreement with the above plan.   Note: Portions of this report may have been transcribed using voice recognition software. Every effort was made to ensure accuracy; however, inadvertent computerized transcription errors may be present.   Final Clinical Impression(s) / ED Diagnoses Final diagnoses:  Chest pain, unspecified type  Pleural effusion on right  Pulmonary nodule   Rx / DC Orders ED Discharge Orders          Ordered    azithromycin (ZITHROMAX) 250 MG tablet  Daily        03/19/21 1549    oxyCODONE-acetaminophen (PERCOCET/ROXICET) 5-325 MG tablet  Every 8 hours PRN        03/19/21 1549              Rayna Sexton, PA-C 03/19/21 1557    Valarie Merino, MD 03/21/21 1037

## 2021-03-19 NOTE — ED Triage Notes (Signed)
C/o SOB and R lateral chest pain x 1 week.  Denies cough, fever, and chills.  Pain worse with movement.

## 2021-03-19 NOTE — ED Notes (Signed)
Pt NAD, a/ox4. Pt verbalizes understanding of all DC and f/u instructions. All questions answered. Pt walks with steady gait to lobby at DC.  ? ?

## 2021-03-19 NOTE — ED Notes (Signed)
Pt lying in bed, NAD with provider at bedside. A/ox4, speaking in full and complete sentences. Pt c/o 10/10 stabbing right axillary CP that increases on inspiration and certain movements. Crackles heard in bases, right greater than left. Denies SOB, n/v, dizziness. VSS

## 2021-03-19 NOTE — ED Notes (Signed)
ED Provider at bedside. 

## 2021-03-19 NOTE — ED Provider Notes (Signed)
Emergency Medicine Provider Triage Evaluation Note  [name] , a 56 y.o. male  was evaluated in triage.  Pt complains of shortness of breath and chest pain.  Symptoms have been present for about 1 week.  Patient reports shortness of breath that is present constantly and intermittent right-sided chest pain that seems to be worse with inspiration.  No fevers or chills, no productive cough.  History of PE and is on Eliquis.  Reports compliance and no missed doses recently.  No lower extremity swelling.  Review of Systems  Positive: Shortness of breath, chest pain Negative: Fever, cough, abdominal pain, vomiting  Physical Exam  BP (!) 139/95 (BP Location: Left Arm)    Pulse (!) 102    Temp 98.9 F (37.2 C) (Oral)    Resp 18    SpO2 97%  Gen:   Awake, no distress   Resp:  Normal effort, some diminished breath sounds in the bases MSK:   Moves extremities without difficulty  Other:    Medical Decision Making  Medically screening exam initiated at 10:21 AM.  Appropriate orders placed.  [name] was informed that the remainder of the evaluation will be completed by another provider, this initial triage assessment does not replace that evaluation, and the importance of remaining in the ED until their evaluation is complete.     Dartha Lodge, PA-C 03/19/21 1028    Cathren Laine, MD 03/20/21 [phone]

## 2021-03-21 ENCOUNTER — Ambulatory Visit (INDEPENDENT_AMBULATORY_CARE_PROVIDER_SITE_OTHER): Payer: Medicaid Other

## 2021-03-21 ENCOUNTER — Other Ambulatory Visit: Payer: Self-pay

## 2021-03-21 ENCOUNTER — Ambulatory Visit (HOSPITAL_COMMUNITY)
Admission: EM | Admit: 2021-03-21 | Discharge: 2021-03-21 | Disposition: A | Discharge status: Home / Self Care | Payer: Medicaid Other | Attending: Student | Admitting: Student

## 2021-03-21 ENCOUNTER — Encounter (HOSPITAL_COMMUNITY): Payer: Self-pay | Admitting: *Deleted

## 2021-03-21 DIAGNOSIS — R0602 Shortness of breath: Secondary | ICD-10-CM

## 2021-03-21 DIAGNOSIS — Z7901 Long term (current) use of anticoagulants: Secondary | ICD-10-CM | POA: Diagnosis not present

## 2021-03-21 DIAGNOSIS — Z86711 Personal history of pulmonary embolism: Secondary | ICD-10-CM | POA: Diagnosis not present

## 2021-03-21 MED ORDER — ALBUTEROL SULFATE HFA 108 (90 BASE) MCG/ACT IN AERS: 1.0000 | INHALATION_SPRAY | Freq: Four times a day (QID) | RESPIRATORY_TRACT | 0 refills | Status: DC | PRN

## 2021-03-21 NOTE — Discharge Instructions (Addendum)
-  Albuterol inhaler as needed for cough, wheezing, shortness of breath, 1 to 2 puffs every 6 hours as needed. -Follow-up with pulm if symptoms persist -Follow-up with ED if symptoms worsen and you can't see your pulmonlogist - shortness of breath, chest pain, etc.

## 2021-03-21 NOTE — ED Triage Notes (Signed)
Pt seen in ED Sunday and had a x-ray that showed fluid on Rt side of lung. Pt reports this SHOB has shifted to Lt side . Pt went to ED but wait was to long so he came to [facility].

## 2021-03-21 NOTE — ED Provider Notes (Signed)
[place]    CSN: [id number] Arrival date & time: 03/21/21  1619      History   Chief Complaint Chief Complaint  Patient presents with   Shortness of Breath   fluid on lungs    HPI Justin [name] is a 56 y.o. male presenting with continued SOB and MSK CP. history OSA on CPAP, PE, subdural hematoma.   Presented to ED for this on 03/19/21; per their note:  "States that he has been experiencing a right-sided sharp chest pain with deep inspiration for the past 1.5 weeks.  It has been constant.  Does not improve when changing positions.  Reports mild shortness of breath due to this.  No nausea, vomiting, diaphoresis, cough, rhinorrhea, sore throat.  States he is anticoagulated on Eliquis and denies any missed doses.  States he recently had a COVID-19 infection in November of this year.  Denies any history of CHF.  Denies any history of smoking...   Patient is anticoagulated on Eliquis and denies any missed doses.  CTA appears negative for PE.  There is concern for mild cardiomegaly as well as evidence of mild interstitial pulmonary edema in the lungs and a trace right pleural effusion.  On my exam patient has rales in the bilateral lung bases.  Right slightly greater than left.  They also noted a 1.1 x 1.6 x 1.8 cm nodule in the lateral segment of the right middle lobe.  They are concerned this could be infectious or inflammatory in etiology.  Underlying neoplasm is not excluded.  This was discussed with the patient.  Recommended follow-up CT imaging in 3 months.  He is going to discuss this with his PCP.    Patient has no pedal edema on my exam.  BNP within normal limits at 57.  Does not appear to be consistent with new onset CHF.  Troponins are mildly elevated but flat.  Appear to be consistent with his baseline values.  History, lab work, and ECG does not appear to be consistent with ACS at this time.  Patient denies any URI symptoms.  CBC without leukocytosis.  Does not appear to  be consistent with PNA or a URI.  But given his recent COVID-19 infection November will discharge on a course of azithromycin.  We will also provide a short course of Percocet for his pain.  We discussed safety regarding this medication."  Today states he still has SOB with lying flat or exertion. L sided thoracic pain with deep inspiration or movement. Denies CP, dizziness, weakness, palpitations. Taking the azithromycin as directed.     HPI  Past Medical History:  Diagnosis Date   Achilles rupture, left    Bipolar 1 disorder (HCC)    Dental caries    periodontitis   Pneumonia    Pulmonary embolism ([place]) 05/18/2007   Sleep apnea    wears CPAP   Subdural hematoma 01/04/2013   in setting of supratherapeutic INR    Patient Active Problem List   Diagnosis Date Noted   Status post lumbar spinal fusion 01/03/2018   Lumbar stenosis 12/23/2017   Spondylolisthesis, lumbar region 06/25/2017   Achilles tendinitis of left lower extremity 12/07/2016   Achilles tendinitis, left leg 05/17/2016   Obstructive sleep apnea on CPAP 03/07/2015   Post-operative state 03/07/2015   Achilles rupture, left 10/21/2014   OSA (obstructive sleep apnea) 04/23/2013   Bipolar disorder, unspecified ([place]) 01/07/2013   Sinus tachycardia 01/07/2013   Headache(784.0) 01/07/2013   SDH (subdural hematoma)  01/04/2013   H/O PE 01/04/2013   Chronic anticoagulation 01/04/2013   Warfarin-induced coagulopathy ([place]) 01/04/2013   Acute sinusitis 01/04/2013    Past Surgical History:  Procedure Laterality Date   ACHILLES TENDON SURGERY Left 10/21/2014   Procedure: Left Achilles Reconstruction;  Surgeon: Newt Minion, MD;  Location: [facility];  Service: Orthopedics;  Laterality: Left;   APPENDECTOMY     CARDIAC CATHETERIZATION  03/04/2018   [facility]: Normal coronaries, LVEF estimated at 40%, medical Rx   CRANIOTOMY N/A 01/19/2013   Procedure: CRANIOTOMY HEMATOMA EVACUATION SUBDURAL;  Surgeon: Hosie Spangle, MD;   Location: [facility];  Service: Neurosurgery;  Laterality: N/A;   CYSTECTOMY     right head   ELBOW SURGERY     right   FRACTURE SURGERY     finger   I & D EXTREMITY Left 12/07/2016   Procedure: LEFT ACHILLES DEBRIDEMENT;  Surgeon: Newt Minion, MD;  Location: [facility];  Service: Orthopedics;  Laterality: Left;   LUMBAR FUSION  12/23/2017   L5 GILL PROCEDURE, RIGHT L5-S1, TRANSFORAMIAL LUMBAR INTERBODY FUSION, BILATERAL LATERAL FUSION, PEDICLE INSTRUMENTATION   MULTIPLE EXTRACTIONS WITH ALVEOLOPLASTY N/A 03/07/2015   Procedure: MULTIPLE EXTRACTION WITH ALVEOLOPLASTY;  Surgeon: Diona Browner, DDS;  Location: [facility];  Service: Oral Surgery;  Laterality: N/A;   SPINAL CORD STIMULATOR INSERTION N/A 05/18/2019   Procedure: LUMBAR SPINAL CORD STIMULATOR INSERTION;  Surgeon: Clydell Hakim, MD;  Location: [facility];  Service: Neurosurgery;  Laterality: N/A;  Thoracic/Lumbar   SPINAL CORD STIMULATOR INSERTION N/A 04/06/2020   Procedure: Revision of spinal cord stimulator;  Surgeon: Reece Agar, MD;  Location: [facility];  Service: Neurosurgery;  Laterality: N/A;       Home Medications    Prior to Admission medications   Medication Sig Start Date End Date Taking? Authorizing Provider  albuterol (VENTOLIN HFA) 108 (90 Base) MCG/ACT inhaler Inhale 1-2 puffs into the lungs every 6 (six) hours as needed for wheezing or shortness of breath. 03/21/21  Yes Hazel Sams, PA-C  acetaminophen (TYLENOL) 500 MG tablet Take 1 tablet (500 mg total) by mouth every 6 (six) hours as needed. Patient not taking: Reported on 02/24/2020 08/28/19   Rodell Perna A, PA-C  azithromycin (ZITHROMAX) 250 MG tablet Take 1 tablet (250 mg total) by mouth daily. Take first 2 tablets together, then 1 every day until finished. 03/19/21   Rayna Sexton, PA-C  cyclobenzaprine (FLEXERIL) 10 MG tablet Take 1 tablet (10 mg total) by mouth 3 (three) times daily as needed for muscle spasms. 09/12/20   Hayden Rasmussen, MD  doxepin (SINEQUAN) 75  MG capsule Take 75 mg by mouth at bedtime. 12/14/19   [provider]  ELIQUIS 5 MG TABS tablet Take 5 mg by mouth 2 (two) times daily. 07/14/19   [provider]  hydrOXYzine (ATARAX/VISTARIL) 25 MG tablet Take 25 mg by mouth at bedtime. 08/22/19   [provider]  lithium 600 MG capsule Take 600 mg by mouth at bedtime.    [provider]  lithium carbonate (ESKALITH) 450 MG CR tablet Take 450 mg by mouth daily. 12/14/19   [provider]  mirtazapine (REMERON) 45 MG tablet Take 45 mg by mouth at bedtime.    [provider]  oxyCODONE-acetaminophen (PERCOCET/ROXICET) 5-325 MG tablet Take 1 tablet by mouth every 8 (eight) hours as needed for severe pain. 03/19/21   Rayna Sexton, PA-C  tiZANidine (ZANAFLEX) 4 MG tablet Take 4 mg by mouth at bedtime.  12/17/19   [provider]    Family History Family History  Problem Relation Age of Onset   Hypertension Mother    Stroke Mother    Multiple sclerosis Sister    Down syndrome Son     Social History Social History   Tobacco Use   Smoking status: Never   Smokeless tobacco: Never  Vaping Use   Vaping Use: Never used  Substance Use Topics   Alcohol use: No   Drug use: No     Allergies   Lyrica [pregabalin]   Review of Systems Review of Systems  Constitutional:  Negative for appetite change, chills and fever.  HENT:  Negative for congestion, ear pain, rhinorrhea, sinus pressure, sinus pain and sore throat.   Eyes:  Negative for redness and visual disturbance.  Respiratory:  Positive for shortness of breath. Negative for cough, chest tightness and wheezing.   Cardiovascular:  Negative for chest pain and palpitations.  Gastrointestinal:  Negative for abdominal pain, constipation, diarrhea, nausea and vomiting.  Genitourinary:  Negative for dysuria, frequency and urgency.  Musculoskeletal:  Negative for myalgias.  Neurological:  Negative for dizziness, weakness and  headaches.  Psychiatric/Behavioral:  Negative for confusion.   All other systems reviewed and are negative.   Physical Exam Triage Vital Signs ED Triage Vitals  Enc Vitals Group     BP 03/21/21 1738 130/87     Pulse Rate 03/21/21 1738 92     Resp 03/21/21 1738 20     Temp 03/21/21 1738 98.2 F (36.8 C)     Temp src --      SpO2 03/21/21 1738 95 %     Weight --      Height --      Head Circumference --      Peak Flow --      Pain Score 03/21/21 1736 0     Pain Loc --      Pain Edu? --      Excl. in [place]? --    No data found.  Updated Vital Signs BP 130/87    Pulse 92    Temp 98.2 F (36.8 C)    Resp 20    SpO2 95%   Visual Acuity Right Eye Distance:   Left Eye Distance:   Bilateral Distance:    Right Eye Near:   Left Eye Near:    Bilateral Near:     Physical Exam Vitals reviewed.  Constitutional:      General: He is not in acute distress.    Appearance: Normal appearance. He is not ill-appearing or diaphoretic.  HENT:     Head: Normocephalic and atraumatic.     Mouth/Throat:     Mouth: Mucous membranes are moist.  Eyes:     Extraocular Movements: Extraocular movements intact.     Pupils: Pupils are equal, round, and reactive to light.  Cardiovascular:     Rate and Rhythm: Normal rate and regular rhythm.     Pulses:          Radial pulses are 2+ on the right side and 2+ on the left side.     Heart sounds: Normal heart sounds.  Pulmonary:     Effort: Pulmonary effort is normal.     Breath sounds: Examination of the right-lower field reveals rales. Examination of the left-lower field reveals rales. Rales present. No decreased breath sounds, wheezing or rhonchi.  Abdominal:     Palpations: Abdomen is soft.     Tenderness:  There is no abdominal tenderness. There is no guarding or rebound.  Musculoskeletal:     Right lower leg: No edema.     Left lower leg: No edema.     Comments: L sided thoracic paraspinous tenderness to palpation.  Skin:    General: Skin  is warm.     Capillary Refill: Capillary refill takes less than 2 seconds.  Neurological:     General: No focal deficit present.     Mental Status: He is alert and oriented to person, place, and time.  Psychiatric:        Mood and Affect: Mood normal.        Behavior: Behavior normal.        Thought Content: Thought content normal.        Judgment: Judgment normal.     UC Treatments / Results  Labs (all labs ordered are listed, but only abnormal results are displayed) Labs Reviewed - No data to display  EKG   Radiology DG Chest 2 View  Result Date: 03/21/2021 CLINICAL DATA:  Shortness of breath. EXAM: CHEST - 2 VIEW COMPARISON:  March 19, 2021. FINDINGS: Stable cardiomediastinal silhouette. Minimal bibasilar subsegmental atelectasis is noted. Bony thorax is unremarkable. IMPRESSION: Minimal bibasilar subsegmental atelectasis. Electronically Signed   By: Marijo Conception M.D.   On: 03/21/2021 17:56    Procedures Procedures (including critical care time)  Medications Ordered in UC Medications - No data to display  Initial Impression / Assessment and Plan / UC Course  I have reviewed the triage vital signs and the nursing notes.  Pertinent labs & imaging results that were available during my care of the patient were reviewed by me and considered in my medical decision making (see chart for details).     This patient is a very pleasant 56 y.o. year old male presenting with shortness of breath. Seen for this complaint in the ED 03/19/2021. History PE, on long-term anticoagulation (Eliquis) for this; has not missed dose.  Negative DVT/PE evaluation in the ED on 03/19/21. Trace R pleural effusion on CT 03/19/21 in ED. CXR today Minimal bibasilar subsegmental atelectasis. Results reviewed by myself and attending physician Dr. Alphonzo Cruise.   ED discharged him home with azithromycin given recent COVID. Continue this. Also sent albuterol inhaler today. F/u with PCP for recheck. He already has  pulmonologist - Dr. Halford Chessman. Rec repeat CT in 3 months as advised by ED.  ED return precautions discussed. Patient verbalizes understanding and agreement.   Discussed treatment plan with attending physician Dr. Alphonzo Cruise who is in agreement.   Final Clinical Impressions(s) / UC Diagnoses   Final diagnoses:  Shortness of breath  History of pulmonary embolism  Long term current use of anticoagulant therapy     Discharge Instructions      -Albuterol inhaler as needed for cough, wheezing, shortness of breath, 1 to 2 puffs every 6 hours as needed. -Follow-up with pulm if symptoms persist -Follow-up with ED if symptoms worsen and you can't see your pulmonlogist - shortness of breath, chest pain, etc.      ED Prescriptions     Medication Sig Dispense Auth. Provider   albuterol (VENTOLIN HFA) 108 (90 Base) MCG/ACT inhaler Inhale 1-2 puffs into the lungs every 6 (six) hours as needed for wheezing or shortness of breath. 1 each Hazel Sams, PA-C      PDMP not reviewed this encounter.   Hazel Sams, PA-C 03/21/21 1840

## 2021-05-01 ENCOUNTER — Other Ambulatory Visit: Payer: Self-pay | Admitting: Orthopedic Surgery

## 2021-05-01 DIAGNOSIS — M259 Joint disorder, unspecified: Secondary | ICD-10-CM

## 2021-05-24 ENCOUNTER — Other Ambulatory Visit: Payer: Self-pay

## 2021-05-24 ENCOUNTER — Ambulatory Visit
Admission: RE | Admit: 2021-05-24 | Discharge: 2021-05-24 | Disposition: A | Discharge status: Home / Self Care | Payer: Medicaid Other | Source: Ambulatory Visit | Attending: Orthopedic Surgery | Admitting: Orthopedic Surgery

## 2021-05-24 DIAGNOSIS — M259 Joint disorder, unspecified: Secondary | ICD-10-CM

## 2021-08-09 ENCOUNTER — Other Ambulatory Visit: Payer: Self-pay | Admitting: Orthopedic Surgery

## 2021-08-09 DIAGNOSIS — M5451 Vertebrogenic low back pain: Secondary | ICD-10-CM

## 2021-08-10 ENCOUNTER — Other Ambulatory Visit: Payer: Medicaid Other

## 2021-08-15 ENCOUNTER — Ambulatory Visit
Admission: RE | Admit: 2021-08-15 | Discharge: 2021-08-15 | Disposition: A | Discharge status: Home / Self Care | Payer: Medicaid Other | Source: Ambulatory Visit | Attending: Orthopedic Surgery | Admitting: Orthopedic Surgery

## 2021-08-15 DIAGNOSIS — M5451 Vertebrogenic low back pain: Secondary | ICD-10-CM

## 2021-08-15 MED ORDER — MEPERIDINE HCL 50 MG/ML IJ SOLN: 50.0000 mg | Freq: Once | INTRAMUSCULAR | Status: DC | PRN

## 2021-08-15 MED ORDER — ONDANSETRON HCL 4 MG/2ML IJ SOLN: 4.0000 mg | Freq: Once | INTRAMUSCULAR | Status: DC | PRN

## 2021-08-15 MED ORDER — DIAZEPAM 5 MG PO TABS
10.0000 mg | ORAL_TABLET | Freq: Once | ORAL | Status: AC
  Administered 2021-08-15: 5 mg via ORAL

## 2021-08-15 MED ORDER — IOPAMIDOL (ISOVUE-M 200) INJECTION 41%
15.0000 mL | Freq: Once | INTRAMUSCULAR | Status: AC
  Administered 2021-08-15: 15 mL via INTRATHECAL

## 2021-08-15 NOTE — Discharge Instructions (Signed)

## 2021-08-15 NOTE — Progress Notes (Signed)
Pt also reports he has a spinal cord stimulator. Pt reports his SCS has been turned off prior to CT Myelogram.

## 2021-09-23 ENCOUNTER — Emergency Department (HOSPITAL_COMMUNITY)
Admission: EM | Admit: 2021-09-23 | Discharge: 2021-09-23 | Disposition: A | Discharge status: Home / Self Care | Payer: Medicaid Other | Attending: Emergency Medicine | Admitting: Emergency Medicine

## 2021-09-23 ENCOUNTER — Other Ambulatory Visit: Payer: Self-pay

## 2021-09-23 ENCOUNTER — Encounter (HOSPITAL_COMMUNITY): Payer: Self-pay

## 2021-09-23 ENCOUNTER — Emergency Department (HOSPITAL_BASED_OUTPATIENT_CLINIC_OR_DEPARTMENT_OTHER): Payer: Medicaid Other

## 2021-09-23 DIAGNOSIS — G8929 Other chronic pain: Secondary | ICD-10-CM | POA: Diagnosis not present

## 2021-09-23 DIAGNOSIS — M5441 Lumbago with sciatica, right side: Secondary | ICD-10-CM | POA: Diagnosis not present

## 2021-09-23 DIAGNOSIS — R52 Pain, unspecified: Secondary | ICD-10-CM

## 2021-09-23 DIAGNOSIS — I82451 Acute embolism and thrombosis of right peroneal vein: Secondary | ICD-10-CM | POA: Insufficient documentation

## 2021-09-23 DIAGNOSIS — M79604 Pain in right leg: Secondary | ICD-10-CM | POA: Diagnosis present

## 2021-09-23 MED ORDER — PREDNISONE 20 MG PO TABS: 40.0000 mg | ORAL_TABLET | Freq: Every day | ORAL | 0 refills | Status: DC

## 2021-09-23 MED ORDER — KETOROLAC TROMETHAMINE 15 MG/ML IJ SOLN
15.0000 mg | Freq: Once | INTRAMUSCULAR | Status: AC
  Administered 2021-09-23: 15 mg via INTRAVENOUS
  Filled 2021-09-23: qty 1

## 2021-09-23 MED ORDER — KETAMINE HCL 50 MG/5ML IJ SOSY
0.3000 mg/kg | PREFILLED_SYRINGE | Freq: Once | INTRAMUSCULAR | Status: AC
  Administered 2021-09-23: 32 mg via INTRAVENOUS
  Filled 2021-09-23: qty 5

## 2021-09-23 MED ORDER — ENOXAPARIN SODIUM 100 MG/ML IJ SOSY
100.0000 mg | PREFILLED_SYRINGE | Freq: Two times a day (BID) | INTRAMUSCULAR | 0 refills | Status: DC
Start: 2021-09-23 — End: 2021-12-10

## 2021-09-23 MED ORDER — ENOXAPARIN SODIUM 100 MG/ML IJ SOSY
100.0000 mg | PREFILLED_SYRINGE | Freq: Two times a day (BID) | INTRAMUSCULAR | Status: DC
  Administered 2021-09-23: 100 mg via SUBCUTANEOUS
  Filled 2021-09-23 (×2): qty 1

## 2021-09-23 NOTE — Discharge Instructions (Addendum)
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  Use Lovenox shots twice a day.  Hopefully the steroids can help with the back pain also.  Follow-up with your doctors for that.

## 2021-09-23 NOTE — ED Triage Notes (Signed)
Patient complains of back pain radiating down right leg x 2 days and pain radiating into groin. Has hx of clots. Takes blood thinner daily.  Denies trauma

## 2021-09-23 NOTE — Progress Notes (Signed)
VASCULAR LAB    Left lower extremity venous duplex has been performed.  See CV proc for preliminary results.  Messaged results to Dr. Rubin Payor via secure chat  Sherren Kerns, RVT 09/23/2021, 5:21 PM

## 2021-09-23 NOTE — ED Provider Notes (Signed)
[facility] EMERGENCY DEPARTMENT Provider Note   CSN: [id number] Arrival date & time: 09/23/21  1319     History  No chief complaint on file.   ANTRON SETH is a 56 y.o. male.  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  Has had for a while now.  Has been seen by spinal surgery and has had previous surgery.  Also has a stimulator.  Pain worse recently.  Patient is frustrated because pain is not controlled.  States he does not even want to be on the pain medicines but wants to know more what was going on.  Worse with certain movements.  States he does sometimes feel cramping in the back.  Has seen Dr. Shon Baton and has had injections and CT myelograms.   Past Medical History:  Diagnosis Date   Achilles rupture, left    Bipolar 1 disorder (HCC)    Dental caries    periodontitis   Pneumonia    Pulmonary embolism (HCC) 05/18/2007   Sleep apnea    wears CPAP   Subdural hematoma (HCC) 01/04/2013   in setting of supratherapeutic INR   Past Surgical History:  Procedure Laterality Date   ACHILLES TENDON SURGERY Left 10/21/2014   Procedure: Left Achilles Reconstruction;  Surgeon: Nadara Mustard, MD;  Location: [facility];  Service: Orthopedics;  Laterality: Left;   APPENDECTOMY     CARDIAC CATHETERIZATION  03/04/2018   [facility]: Normal coronaries, LVEF estimated at 40%, medical Rx   CRANIOTOMY N/A 01/19/2013   Procedure: CRANIOTOMY HEMATOMA EVACUATION SUBDURAL;  Surgeon: Hewitt Shorts, MD;  Location: [facility];  Service: Neurosurgery;  Laterality: N/A;   CYSTECTOMY     right head   ELBOW SURGERY     right   FRACTURE SURGERY     finger   I & D EXTREMITY Left 12/07/2016   Procedure: LEFT ACHILLES DEBRIDEMENT;  Surgeon: Nadara Mustard, MD;  Location: [facility] OR;  Service: Orthopedics;  Laterality: Left;   LUMBAR FUSION  12/23/2017   L5  GILL PROCEDURE, RIGHT L5-S1, TRANSFORAMIAL LUMBAR INTERBODY FUSION, BILATERAL LATERAL FUSION, PEDICLE INSTRUMENTATION   MULTIPLE EXTRACTIONS WITH ALVEOLOPLASTY N/A 03/07/2015   Procedure: MULTIPLE EXTRACTION WITH ALVEOLOPLASTY;  Surgeon: [name], DDS;  Location: [facility];  Service: Oral Surgery;  Laterality: N/A;   SPINAL CORD STIMULATOR INSERTION N/A 05/18/2019   Procedure: LUMBAR SPINAL CORD STIMULATOR INSERTION;  Surgeon: Odette Fraction, MD;  Location: [facility] OR;  Service: Neurosurgery;  Laterality: N/A;  Thoracic/Lumbar   SPINAL CORD STIMULATOR INSERTION N/A 04/06/2020   Procedure: Revision of spinal cord stimulator;  Surgeon: Renaldo Fiddler, MD;  Location: [facility] OR;  Service: Neurosurgery;  Laterality: N/A;    Home Medications Prior to Admission medications   Medication Sig Start Date End Date Taking? Authorizing Provider  enoxaparin (LOVENOX) 100 MG/ML injection Inject 1 mL (100 mg total) into the skin every 12 (twelve) hours. 09/23/21  Yes Benjiman Core, MD  predniSONE (DELTASONE) 20 MG tablet Take 2 tablets (40 mg total) by mouth daily. 09/23/21  Yes Benjiman Core, MD  acetaminophen (TYLENOL) 500 MG tablet Take 1 tablet (500 mg total) by mouth every 6 (six) hours as needed. Patient not taking: Reported on 02/24/2020 08/28/19   Michela Pitcher A, PA-C  albuterol (VENTOLIN HFA) 108 (90 Base) MCG/ACT inhaler Inhale 1-2 puffs into the lungs every  6 (six) hours as needed for wheezing or shortness of breath. 03/21/21   Rhys Martini, PA-C  azithromycin (ZITHROMAX) 250 MG tablet Take 1 tablet (250 mg total) by mouth daily. Take first 2 tablets together, then 1 every day until finished. 03/19/21   Placido Sou, PA-C  cyclobenzaprine (FLEXERIL) 10 MG tablet Take 1 tablet (10 mg total) by mouth 3 (three) times daily as needed for muscle spasms. 09/12/20   Terrilee Files, MD  doxepin (SINEQUAN) 75 MG capsule Take 75 mg by mouth at bedtime. 12/14/19   [provider]  hydrOXYzine (ATARAX/VISTARIL)  25 MG tablet Take 25 mg by mouth at bedtime. 08/22/19   [provider]  lithium 600 MG capsule Take 600 mg by mouth at bedtime.    [provider]  lithium carbonate (ESKALITH) 450 MG CR tablet Take 450 mg by mouth daily. 12/14/19   [provider]  mirtazapine (REMERON) 45 MG tablet Take 45 mg by mouth at bedtime.    [provider]  oxyCODONE-acetaminophen (PERCOCET/ROXICET) 5-325 MG tablet Take 1 tablet by mouth every 8 (eight) hours as needed for severe pain. 03/19/21   Placido Sou, PA-C  tiZANidine (ZANAFLEX) 4 MG tablet Take 4 mg by mouth at bedtime.  12/17/19   [provider]      Allergies    Lyrica [pregabalin]    Review of Systems   Review of Systems  Physical Exam Updated Vital Signs BP (!) 157/106 (BP Location: Right Arm)   Pulse 86   Temp 99 F (37.2 C) (Oral)   Resp 19   Wt 106.1 kg   SpO2 99%   BMI 34.56 kg/m  Physical Exam  ED Results / Procedures / Treatments   Labs (all labs ordered are listed, but only abnormal results are displayed) Labs Reviewed - No data to display  EKG None  Radiology VAS [place] LOWER EXTREMITY VENOUS (DVT) (ONLY [facility])  Result Date: 09/23/2021  Lower Venous DVT Study Patient Name:  KAEMON BARNETT  Date of Exam:   09/23/2021 Medical Rec #: [id number]       Accession #:    [id number] Date of Birth: January 10, 1966       Patient Gender: M Patient Age:   41 years Exam Location:  [facility] Procedure:      VAS [place] LOWER EXTREMITY VENOUS (DVT) Referring Phys: Benjiman Core --------------------------------------------------------------------------------  Indications: Pain//cramping.  Risk Factors: DVT 10/21/2018 right peroneal. Comparison Study: Prior negative right LEV done 01/03/20 Performing Technologist: Sherren Kerns RVS  Examination Guidelines: A complete evaluation includes B-mode imaging, spectral Doppler, color Doppler, and power Doppler as needed of all accessible portions of each vessel.  Bilateral testing is considered an integral part of a complete examination. Limited examinations for reoccurring indications may be performed as noted. The reflux portion of the exam is performed with the patient in reverse Trendelenburg.  +---------+---------------+---------+-----------+----------+--------------+ RIGHT    CompressibilityPhasicitySpontaneityPropertiesThrombus Aging +---------+---------------+---------+-----------+----------+--------------+ CFV      Full           Yes      Yes                                 +---------+---------------+---------+-----------+----------+--------------+ SFJ      Full                                                        +---------+---------------+---------+-----------+----------+--------------+  FV Prox  Full                                                        +---------+---------------+---------+-----------+----------+--------------+ FV Mid   Full                                                        +---------+---------------+---------+-----------+----------+--------------+ FV DistalFull                                                        +---------+---------------+---------+-----------+----------+--------------+ PFV      Full                                                        +---------+---------------+---------+-----------+----------+--------------+ POP      Full           Yes      Yes                                 +---------+---------------+---------+-----------+----------+--------------+ PTV      Full                                                        +---------+---------------+---------+-----------+----------+--------------+ PERO     None                                                        +---------+---------------+---------+-----------+----------+--------------+ Gastroc  Full           No       No                   Acute           +---------+---------------+---------+-----------+----------+--------------+   +----+---------------+---------+-----------+----------+--------------+ LEFTCompressibilityPhasicitySpontaneityPropertiesThrombus Aging +----+---------------+---------+-----------+----------+--------------+ CFV Full           Yes      Yes                                 +----+---------------+---------+-----------+----------+--------------+    Summary: RIGHT: - Findings consistent with acute deep vein thrombosis involving the right peroneal veins. - No cystic structure found in the popliteal fossa.  LEFT: - No evidence of common femoral vein obstruction.  *See table(s) above for measurements and observations.    Preliminary     Procedures Procedures    Medications Ordered in ED Medications  enoxaparin (LOVENOX) injection  100 mg (100 mg Subcutaneous Given 09/23/21 2000)  ketorolac (TORADOL) 15 MG/ML injection 15 mg (15 mg Intravenous Given 09/23/21 1654)  ketamine 50 mg in normal saline 5 mL (10 mg/mL) syringe (32 mg Intravenous Given 09/23/21 1749)    ED Course/ Medical Decision Making/ A&P                           Medical Decision Making Risk Prescription drug management.   Patient presents with back pain.  Chronic.  Has worsened little bit now and going down further on the leg.  States it feels like when he has had blood clots in the past.  Review notes appears has had clots in the peroneal vein in the past.  Does have IVC filter also.  Appears to have an IVC distally again.  Doubt pulmonary embolism.  Discussed with vascular surgery and then with hematology.  Will switch to Lovenox as patient is currently on Eliquis and states compliance.  Has not not had hypercoagulable work-up previously that we can discern.  Started on Lovenox twice a day and will give some prednisone to help with the back pain.  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   09/23/21 2007    enoxaparin (LOVENOX) 100 MG/ML injection  Every 12 hours        09/23/21 2008    predniSONE (DELTASONE) 20 MG tablet  Daily        09/23/21 2009              Benjiman Core, MD 09/23/21 2327

## 2021-09-23 NOTE — ED Provider Triage Note (Signed)
Emergency Medicine Provider Triage Evaluation Note  Justin [name] , a 56 y.o. male  was evaluated in triage.  Pt complains of sciatica on the right side, this been ongoing for greater than 1 month.  He is already been seen by his primary care doctor and given treatment.  He states that is not working is having difficulty taking care of his son who has downs and cognitive deficits.  He is here for pain control..  Review of Systems  Positive: Back pain Negative: Weakness  Physical Exam  BP (!) 142/106 (BP Location: Left Arm)   Pulse (!) 55   Temp 99 F (37.2 C) (Oral)   Resp 16   SpO2 96%  Gen:   Awake, no distress   Resp:  Normal effort  MSK:   Moves extremities without difficulty  Other:  Antalgic gait  Medical Decision Making  Medically screening exam initiated at 1:51 PM.  Appropriate orders placed.  [name] was informed that the remainder of the evaluation will be completed by another provider, this initial triage assessment does not replace that evaluation, and the importance of remaining in the ED until their evaluation is complete.  Sciatica-    Arthor Captain, PA-C 09/23/21 1352

## 2021-09-23 NOTE — ED Notes (Signed)
Pt educated on how to give a proper injection. PT administered lovenox independently.

## 2021-09-25 ENCOUNTER — Telehealth: Payer: Self-pay | Admitting: Physician Assistant

## 2021-09-25 NOTE — Telephone Encounter (Signed)
Scheduled appt per 7/8 referral. Pt is aware of appt date and time. Pt is aware to arrive 15 mins prior to appt time and to bring and updated insurance card. Pt is aware of appt location.   

## 2021-10-11 ENCOUNTER — Other Ambulatory Visit: Payer: Self-pay | Admitting: Internal Medicine

## 2021-10-12 ENCOUNTER — Emergency Department (HOSPITAL_COMMUNITY): Payer: Medicaid Other

## 2021-10-12 ENCOUNTER — Emergency Department (HOSPITAL_BASED_OUTPATIENT_CLINIC_OR_DEPARTMENT_OTHER): Payer: Medicaid Other

## 2021-10-12 ENCOUNTER — Emergency Department (HOSPITAL_COMMUNITY)
Admission: EM | Admit: 2021-10-12 | Discharge: 2021-10-13 | Disposition: A | Discharge status: Home / Self Care | Payer: Medicaid Other | Attending: Emergency Medicine | Admitting: Emergency Medicine

## 2021-10-12 ENCOUNTER — Other Ambulatory Visit: Payer: Self-pay

## 2021-10-12 ENCOUNTER — Encounter (HOSPITAL_COMMUNITY): Payer: Self-pay

## 2021-10-12 DIAGNOSIS — I82451 Acute embolism and thrombosis of right peroneal vein: Secondary | ICD-10-CM | POA: Insufficient documentation

## 2021-10-12 DIAGNOSIS — Z7901 Long term (current) use of anticoagulants: Secondary | ICD-10-CM | POA: Diagnosis not present

## 2021-10-12 DIAGNOSIS — R252 Cramp and spasm: Secondary | ICD-10-CM

## 2021-10-12 DIAGNOSIS — Z86718 Personal history of other venous thrombosis and embolism: Secondary | ICD-10-CM | POA: Diagnosis not present

## 2021-10-12 DIAGNOSIS — M79604 Pain in right leg: Secondary | ICD-10-CM

## 2021-10-12 LAB — CBC
HCT: 38.8 % (ref 38.5–50.0)
HCT: 38.7 % — ABNORMAL LOW (ref 39.0–52.0)
Hemoglobin: 12.8 g/dL — ABNORMAL LOW (ref 13.2–17.1)
Hemoglobin: 12.6 g/dL — ABNORMAL LOW (ref 13.0–17.0)
MCH: 28.4 pg (ref 27.0–33.0)
MCH: 28.2 pg (ref 26.0–34.0)
MCHC: 33 g/dL (ref 32.0–36.0)
MCHC: 32.6 g/dL (ref 30.0–36.0)
MCV: 86 fL (ref 80.0–100.0)
MCV: 86.6 fL (ref 80.0–100.0)
MPV: 9.5 fL (ref 7.5–12.5)
Platelets: 295 10*3/uL (ref 140–400)
Platelets: 280 10*3/uL (ref 150–400)
RBC: 4.51 10*6/uL (ref 4.20–5.80)
RBC: 4.47 MIL/uL (ref 4.22–5.81)
RDW: 14.6 % (ref 11.0–15.0)
RDW: 15.5 % (ref 11.5–15.5)
WBC: 10.7 10*3/uL (ref 3.8–10.8)
WBC: 9.2 10*3/uL (ref 4.0–10.5)
nRBC: 0 % (ref 0.0–0.2)

## 2021-10-12 LAB — COMPLETE METABOLIC PANEL WITH GFR
AG Ratio: 1.6 (calc) (ref 1.0–2.5)
ALT: 22 U/L (ref 9–46)
AST: 24 U/L (ref 10–35)
Albumin: 4.4 g/dL (ref 3.6–5.1)
Alkaline phosphatase (APISO): 79 U/L (ref 35–144)
BUN/Creatinine Ratio: 4 (calc) — ABNORMAL LOW (ref 6–22)
BUN: 6 mg/dL — ABNORMAL LOW (ref 7–25)
CO2: 26 mmol/L (ref 20–32)
Calcium: 9.3 mg/dL (ref 8.6–10.3)
Chloride: 106 mmol/L (ref 98–110)
Creat: 1.34 mg/dL — ABNORMAL HIGH (ref 0.70–1.30)
Globulin: 2.8 g/dL (calc) (ref 1.9–3.7)
Glucose, Bld: 98 mg/dL (ref 65–99)
Potassium: 3.7 mmol/L (ref 3.5–5.3)
Sodium: 142 mmol/L (ref 135–146)
Total Bilirubin: 0.4 mg/dL (ref 0.2–1.2)
Total Protein: 7.2 g/dL (ref 6.1–8.1)
eGFR: 61 mL/min/{1.73_m2} (ref >=60)

## 2021-10-12 LAB — BASIC METABOLIC PANEL
Anion gap: 9 (ref 5–15)
BUN: 5 mg/dL — ABNORMAL LOW (ref 6–20)
CO2: 23 mmol/L (ref 22–32)
Calcium: 8.9 mg/dL (ref 8.9–10.3)
Chloride: 109 mmol/L (ref 98–111)
Creatinine, Ser: 1.43 mg/dL — ABNORMAL HIGH (ref 0.61–1.24)
GFR, Estimated: 57 mL/min — ABNORMAL LOW (ref >60)
Glucose, Bld: 163 mg/dL — ABNORMAL HIGH (ref 70–99)
Potassium: 3.5 mmol/L (ref 3.5–5.1)
Sodium: 141 mmol/L (ref 135–145)

## 2021-10-12 LAB — TSH: TSH: 0.88 mIU/L (ref 0.40–4.50)

## 2021-10-12 LAB — LIPID PANEL
Cholesterol: 220 mg/dL — ABNORMAL HIGH (ref <200)
HDL: 57 mg/dL (ref >=40)
LDL Cholesterol (Calc): 127 mg/dL (calc) — ABNORMAL HIGH
Non-HDL Cholesterol (Calc): 163 mg/dL (calc) — ABNORMAL HIGH (ref <130)
Total CHOL/HDL Ratio: 3.9 (calc) (ref <5.0)
Triglycerides: 224 mg/dL — ABNORMAL HIGH (ref <150)

## 2021-10-12 LAB — TROPONIN I (HIGH SENSITIVITY)
Troponin I (High Sensitivity): 27 ng/L — ABNORMAL HIGH (ref <18)
Troponin I (High Sensitivity): 26 ng/L — ABNORMAL HIGH (ref <18)

## 2021-10-12 LAB — PSA: PSA: 1.89 ng/mL (ref <=4.00)

## 2021-10-12 LAB — VITAMIN D 25 HYDROXY (VIT D DEFICIENCY, FRACTURES): Vit D, 25-Hydroxy: 33 ng/mL (ref 30–100)

## 2021-10-12 MED ORDER — OXYCODONE-ACETAMINOPHEN 5-325 MG PO TABS
1.0000 | ORAL_TABLET | Freq: Once | ORAL | Status: AC
  Administered 2021-10-12: 1 via ORAL
  Filled 2021-10-12: qty 1

## 2021-10-12 MED ORDER — IOHEXOL 350 MG/ML SOLN
75.0000 mL | Freq: Once | INTRAVENOUS | Status: AC | PRN
  Administered 2021-10-12: 75 mL via INTRAVENOUS

## 2021-10-12 NOTE — ED Provider Triage Note (Signed)
Emergency Medicine Provider Triage Evaluation Note  Justin [name] , a 56 y.o. male  was evaluated in triage.  Pt complains of right-sided calf tenderness that began yesterday.  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  He was previously on Eliquis, however continued to have DVTs despite anticoagulants.  He continues to complain of right calf cramping exacerbated with any type of ambulation.  Also endorsing some vague chest pain, feels like he is short of breath, specially when lying flat at night.  Review of Systems  Positive: Sob, right calf pain Negative: Fever, cough  Physical Exam  BP 125/82   Pulse 100   Temp 98.9 F (37.2 C) (Oral)   Resp 18   Ht 5\' 10"  (1.778 m)   Wt 105.7 kg   SpO2 94%   BMI 33.43 kg/m  Gen:   Awake, no distress   Resp:  Normal effort  MSK:   Moves extremities without difficulty  Other:  No pitting edema, no calf tenderness  Medical Decision Making  Medically screening exam initiated at 1:07 PM.  Appropriate orders placed.  was informed that the remainder of the evaluation will be completed by another provider, this initial triage assessment does not replace that evaluation, and the importance of remaining in the ED until their evaluation is complete.     [name], PA-C 10/12/21 1311

## 2021-10-12 NOTE — ED Provider Notes (Signed)
[facility] EMERGENCY DEPARTMENT Provider Note   CSN: [id number] Arrival date & time: 10/12/21  1243     History  Chief Complaint  Patient presents with   Leg Pain    Justin [name] is a 55 y.o. male.  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  Patient reports that he has been compliant with the Lovenox and he did stop the Eliquis based on his last visit.  He is supposed to be seeing hematology on August 2.  He denies any recent trauma and reports the last time he had a DVT it was in his right leg as well.  He has no numbness or tingling but is complaining of some pain in his hips that started yesterday.  He is also had some occasional chest heaviness.  He denies any cough, fever, abdominal pain, nausea or vomiting.  He has not had prior coagulation work-up and is unclear why he is getting clots when he is taking blood thinners as prescribed.  The history is provided by the patient.  Leg Pain      Home Medications Prior to Admission medications   Medication Sig Start Date End Date Taking? Authorizing Provider  acetaminophen (TYLENOL) 500 MG tablet Take 1 tablet (500 mg total) by mouth every 6 (six) hours as needed. Patient not taking: Reported on 02/24/2020 08/28/19   Michela Pitcher A, PA-C  albuterol (VENTOLIN HFA) 108 (90 Base) MCG/ACT inhaler Inhale 1-2 puffs into the lungs every 6 (six) hours as needed for wheezing or shortness of breath. 03/21/21   Rhys Martini, PA-C  azithromycin (ZITHROMAX) 250 MG tablet Take 1 tablet (250 mg total) by mouth daily. Take first 2 tablets together, then 1 every day until finished. 03/19/21   Placido Sou, PA-C   cyclobenzaprine (FLEXERIL) 10 MG tablet Take 1 tablet (10 mg total) by mouth 3 (three) times daily as needed for muscle spasms. 09/12/20   Terrilee Files, MD  doxepin (SINEQUAN) 75 MG capsule Take 75 mg by mouth at bedtime. 12/14/19   [provider]  enoxaparin (LOVENOX) 100 MG/ML injection Inject 1 mL (100 mg total) into the skin every 12 (twelve) hours. 09/23/21   Benjiman Core, MD  hydrOXYzine (ATARAX/VISTARIL) 25 MG tablet Take 25 mg by mouth at bedtime. 08/22/19   [provider]  lithium 600 MG capsule Take 600 mg by mouth at bedtime.    [provider]  lithium carbonate (ESKALITH) 450 MG CR tablet Take 450 mg by mouth daily. 12/14/19   [provider]  mirtazapine (REMERON) 45 MG tablet Take 45 mg by mouth at bedtime.    [provider]  oxyCODONE-acetaminophen (PERCOCET/ROXICET) 5-325 MG tablet Take 1 tablet by mouth every 8 (eight) hours as needed for severe pain. 03/19/21   Placido Sou, PA-C  predniSONE (DELTASONE) 20 MG tablet Take 2 tablets (40 mg total) by mouth daily. 09/23/21   Benjiman Core, MD  tiZANidine (ZANAFLEX) 4 MG tablet Take 4 mg by mouth at bedtime.  12/17/19   [provider]      Allergies    Lyrica [pregabalin]  Review of Systems   Review of Systems  Physical Exam Updated Vital Signs BP (!) 131/95   Pulse 82   Temp 99.6 F (37.6 C) (Oral)   Resp 19   Ht 5\' 10"  (1.778 m)   Wt 105.7 kg   SpO2 99%   BMI 33.43 kg/m  Physical Exam Vitals and nursing note reviewed.  Constitutional:      General: He is not in acute distress.    Appearance: He is well-developed.  HENT:     Head: Normocephalic and atraumatic.  Eyes:     Conjunctiva/sclera: Conjunctivae normal.     Pupils: Pupils are equal, round, and reactive to light.  Cardiovascular:     Rate and Rhythm: Normal rate and regular rhythm.     Pulses: Normal pulses.     Heart sounds: No murmur heard. Pulmonary:     Effort: Pulmonary effort  is normal. No respiratory distress.     Breath sounds: Normal breath sounds. No wheezing or rales.  Abdominal:     General: There is no distension.     Palpations: Abdomen is soft.     Tenderness: There is no abdominal tenderness. There is no guarding or rebound.  Musculoskeletal:        General: Tenderness present. Normal range of motion.     Cervical back: Normal range of motion and neck supple.     Comments: Tenderness with palpation of the right calf.  No significant lower extremity edema  Skin:    General: Skin is warm and dry.     Findings: No erythema or rash.  Neurological:     Mental Status: He is alert and oriented to person, place, and time.  Psychiatric:        Behavior: Behavior normal.     ED Results / Procedures / Treatments   Labs (all labs ordered are listed, but only abnormal results are displayed) Labs Reviewed  BASIC METABOLIC PANEL - Abnormal; Notable for the following components:      Result Value   Glucose, Bld 163 (*)    BUN 5 (*)    Creatinine, Ser 1.43 (*)    GFR, Estimated 57 (*)    All other components within normal limits  CBC - Abnormal; Notable for the following components:   Hemoglobin 12.6 (*)    HCT 38.7 (*)    All other components within normal limits  TROPONIN I (HIGH SENSITIVITY) - Abnormal; Notable for the following components:   Troponin I (High Sensitivity) 27 (*)    All other components within normal limits  TROPONIN I (HIGH SENSITIVITY) - Abnormal; Notable for the following components:   Troponin I (High Sensitivity) 26 (*)    All other components within normal limits    EKG EKG Interpretation  Date/Time:  Thursday October 12 2021 13:18:00 EDT Ventricular Rate:  98 PR Interval:  168 QRS Duration: 98 QT Interval:  410 QTC Calculation: 523 R Axis:   -37 Text Interpretation: Sinus rhythm with Premature atrial complexes Possible Left atrial enlargement Left axis deviation Minimal voltage criteria for LVH, may be normal variant (  [place]ell product ) Nonspecific T wave abnormality Prolonged QT When compared with ECG of 19-Mar-2021 10:13, PREVIOUS ECG IS PRESENT No significant change since last tracing Confirmed by Gwyneth Sprout ([id number]) on 10/12/2021 7:55:10 PM  Radiology CT Angio Chest PE W and/or Wo Contrast  Result Date: 10/12/2021 CLINICAL DATA:  Pulmonary embolus suspected with high probability. Right leg pain and cramping in the calf.  Diagnosed with DVT 3 weeks ago. Lovenox shots. EXAM: CT ANGIOGRAPHY CHEST WITH CONTRAST TECHNIQUE: Multidetector CT imaging of the chest was performed using the standard protocol during bolus administration of intravenous contrast. Multiplanar CT image reconstructions and MIPs were obtained to evaluate the vascular anatomy. RADIATION DOSE REDUCTION: This exam was performed according to the departmental dose-optimization program which includes automated exposure control, adjustment of the mA and/or kV according to patient size and/or use of iterative reconstruction technique. CONTRAST:  100 mL Omnipaque 350 COMPARISON:  03/19/2021 FINDINGS: Cardiovascular: Good opacification of the central and segmental pulmonary arteries. No focal filling defects. No evidence of significant pulmonary embolus. Cardiac enlargement. No pericardial effusions. Normal caliber thoracic aorta. Mild aortic and coronary artery calcification. Mediastinum/Nodes: Mediastinal lymph nodes are not pathologically enlarged, likely reactive. Esophagus is decompressed. Thyroid gland is unremarkable. Lungs/Pleura: Motion artifact limits examination but there is evidence of patchy airspace disease in the lungs. This is mostly central and basilar. Appearance could indicate multifocal pneumonia or edema. No pleural effusions. No pneumothorax. Upper Abdomen: No acute abnormalities. Musculoskeletal: Spinal stimulator lead tips in the lower thoracic central canal. No acute bony abnormalities. Review of the MIP images confirms the above  findings. IMPRESSION: 1. No evidence of significant pulmonary embolus. 2. Cardiac enlargement. 3. Patchy airspace disease in the lungs, possibly multifocal pneumonia or edema. Electronically Signed   By: Burman Nieves M.D.   On: 10/12/2021 21:57   VAS [place] LOWER EXTREMITY VENOUS (DVT) (7a-7p)  Result Date: 10/12/2021  Lower Venous DVT Study Patient Name:  Justin [name]  Date of Exam:   10/12/2021 Medical Rec #: [id number]       Accession #:    [id number] Date of Birth: June 21, 1965       Patient Gender: M Patient Age:   64 years Exam Location:  [facility] Procedure:      VAS [place] LOWER EXTREMITY VENOUS (DVT) Referring Phys: Leonie Douglas SOTO --------------------------------------------------------------------------------  Indications: Right calf cramping.  Anticoagulation: Lovenox. Comparison Study: Prior study done 09/23/21 indicated acute DVT in the right                   peroneal vein Performing Technologist: Sherren Kerns RVS  Examination Guidelines: A complete evaluation includes B-mode imaging, spectral Doppler, color Doppler, and power Doppler as needed of all accessible portions of each vessel. Bilateral testing is considered an integral part of a complete examination. Limited examinations for reoccurring indications may be performed as noted. The reflux portion of the exam is performed with the patient in reverse Trendelenburg.  +---------+---------------+---------+-----------+----------+--------------+ RIGHT    CompressibilityPhasicitySpontaneityPropertiesThrombus Aging +---------+---------------+---------+-----------+----------+--------------+ CFV      Full           Yes      Yes                                 +---------+---------------+---------+-----------+----------+--------------+ SFJ      Full                                                        +---------+---------------+---------+-----------+----------+--------------+ FV Prox  Full                                                         +---------+---------------+---------+-----------+----------+--------------+  FV Mid   Full                                                        +---------+---------------+---------+-----------+----------+--------------+ FV DistalFull                                                        +---------+---------------+---------+-----------+----------+--------------+ PFV      Full                                                        +---------+---------------+---------+-----------+----------+--------------+ POP      Full           Yes      Yes                                 +---------+---------------+---------+-----------+----------+--------------+ PTV      Full                                                        +---------+---------------+---------+-----------+----------+--------------+ PERO     None           No       No                   Acute          +---------+---------------+---------+-----------+----------+--------------+   +----+---------------+---------+-----------+----------+--------------+ LEFTCompressibilityPhasicitySpontaneityPropertiesThrombus Aging +----+---------------+---------+-----------+----------+--------------+ CFV Full           Yes      Yes                                 +----+---------------+---------+-----------+----------+--------------+     Summary: RIGHT: - Findings consistent with acute deep vein thrombosis involving the right peroneal veins. - Findings suggest new clot progression as compared to previous examination. - Peroneal DVT found 09/23/21 appears to have extended further into the peroneal vein.  LEFT: - No evidence of common femoral vein obstruction.  *See table(s) above for measurements and observations. Electronically signed by Gerarda Fraction on 10/12/2021 at 3:41:12 PM.    Final    DG Chest 1 View  Result Date: 10/12/2021 CLINICAL DATA:  Chest pain EXAM: CHEST  1 VIEW COMPARISON:  None Available.  FINDINGS: Normal mediastinum and cardiac silhouette. Normal pulmonary vasculature. No evidence of effusion, infiltrate, or pneumothorax. No acute bony abnormality. Spinal stimulation device noted IMPRESSION: No acute cardiopulmonary process. Electronically Signed   By: Genevive Bi M.D.   On: 10/12/2021 13:32    Procedures Procedures    Medications Ordered in ED Medications  oxyCODONE-acetaminophen (PERCOCET/ROXICET) 5-325 MG per tablet 1 tablet (1 tablet Oral Given 10/12/21 2039)  iohexol (OMNIPAQUE) 350 MG/ML injection 75 mL (75 mLs Intravenous Contrast Given 10/12/21  2152)    ED Course/ Medical Decision Making/ A&P                           Medical Decision Making Amount and/or Complexity of Data Reviewed External Data Reviewed: notes. Labs: ordered. Decision-making details documented in ED Course. Radiology: ordered and independent interpretation performed. Decision-making details documented in ED Course. ECG/medicine tests: ordered and independent interpretation performed. Decision-making details documented in ED Course.  Risk Prescription drug management.   Pt with multiple medical problems and comorbidities and presenting today with a complaint that caries a high risk for morbidity and mortality.  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  Patient had a newly discovered DVT in his right lower extremity on 09/23/2021.  At that time he had been compliant with Eliquis.  They spoke with both vascular and hematology during that emergency room visit and he was changed to Lovenox twice daily which she has been compliant with.  He is no longer taking the Eliquis.  However he is having worsening pain and was also having some chest pain.  He does have an IVC filter.  I independently interpreted patient's EKG and labs today.  Troponin is elevated at 27 and 26 which is similar to baseline, BMP with creatinine 1.43 with  normal electrolytes also similar to baseline, CBC within normal limits. EKG without acute changes today. I have independently visualized and interpreted pt's images today.  Ultrasound does show a DVT.  Per radiology read patient has findings consistent with an acute DVT of the right peroneal veins but findings also suggest a new clot that is progressive since his last evaluation.  Given patient's complaint of some chest discomfort and mild tachycardia intermittently during his stay we will do a CTA to ensure no PEs.  10:41 PM Patient's CTA of his chest today shows no evidence of PE.  Neurology does report patchy airspace disease possible pneumonia or edema but patient reports that shortness of breath that he experiences is no different than his baseline.  Spoke with Dr. Bertis Ruddy hematologist on-call today discussing the findings with her and she reports at this time that ultrasounds can be variable and she does not think that this is a failure of the Lovenox.  She discussed his pain is most likely from the heat, increased activity, not elevating his leg or wearing a compression sock.  At this time she feels that he should continue the Lovenox.  These findings were discussed with the patient and a family member who was on the phone.  Ordered patient a TED hose that he could wear at home.  Encouraged him to elevate his leg and drink at least 64 ounces of liquid per day.  Encouraged him to follow-up with his hematology appointment on August 2.  Patient is okay with this plan.  At this time there is no criteria for admission.          Final Clinical Impression(s) / ED Diagnoses Final diagnoses:  Right leg pain  Acute deep vein thrombosis (DVT) of right peroneal vein [facility])    Rx / DC Orders ED Discharge Orders     None         Gwyneth Sprout, MD 10/12/21 2241

## 2021-10-12 NOTE — Progress Notes (Signed)
VASCULAR LAB    Right lower extremity venous duplex has been performed.  See CV proc for preliminary results.  Gave verbal report to Claude Manges, PA-C  Maja Mccaffery, RVT 10/12/2021, 2:26 PM

## 2021-10-12 NOTE — Discharge Instructions (Signed)
At this time continue your Lovenox twice a day.  The hematologist felt that this was appropriate treatment.  Wear the compression sock as often as you can.  Elevate your leg often.  Avoid getting overheated and drink at least 64 ounces of fluid per day to stay hydrated.  This should help with the pain, make the veins work harder and help dissolve the clot.  If you suddenly develop severe shortness of breath, crushing chest pain, passing out or other concerns please return to the emergency room.

## 2021-10-12 NOTE — ED Triage Notes (Signed)
Pt arrived POV from home c/o right leg pain and cramping in the calf. Pt states he was diagnosed with a DVT about 3 weeks ago and has been taking the Lovenox shots every 12 hrs like he is suppose to.

## 2021-10-13 NOTE — ED Notes (Signed)
Per EDP okay to send pt home without ted hose and instruct him to get some at a medical supply store

## 2021-10-18 ENCOUNTER — Inpatient Hospital Stay: Payer: Medicaid Other | Attending: Physician Assistant | Admitting: Physician Assistant

## 2021-10-18 ENCOUNTER — Inpatient Hospital Stay: Payer: Medicaid Other

## 2021-10-18 ENCOUNTER — Encounter: Payer: Medicaid Other | Admitting: Physician Assistant

## 2021-10-18 ENCOUNTER — Encounter: Payer: Self-pay | Admitting: Physician Assistant

## 2021-10-18 ENCOUNTER — Other Ambulatory Visit: Payer: Medicaid Other

## 2021-10-18 ENCOUNTER — Other Ambulatory Visit: Payer: Self-pay

## 2021-10-18 VITALS — BP 150/90 | HR 55 | Temp 98.7°F | Resp 18 | Ht 70.0 in | Wt 230.5 lb

## 2021-10-18 DIAGNOSIS — Z86711 Personal history of pulmonary embolism: Secondary | ICD-10-CM | POA: Diagnosis not present

## 2021-10-18 DIAGNOSIS — Z7901 Long term (current) use of anticoagulants: Secondary | ICD-10-CM | POA: Insufficient documentation

## 2021-10-18 DIAGNOSIS — I82451 Acute embolism and thrombosis of right peroneal vein: Secondary | ICD-10-CM

## 2021-10-18 LAB — CBC WITH DIFFERENTIAL (CANCER CENTER ONLY)
Abs Immature Granulocytes: 0.11 10*3/uL — ABNORMAL HIGH (ref 0.00–0.07)
Basophils Absolute: 0.1 10*3/uL (ref 0.0–0.1)
Basophils Relative: 1 %
Eosinophils Absolute: 0 10*3/uL (ref 0.0–0.5)
Eosinophils Relative: 0 %
HCT: 41.1 % (ref 39.0–52.0)
Hemoglobin: 13.9 g/dL (ref 13.0–17.0)
Immature Granulocytes: 1 %
Lymphocytes Relative: 19 %
Lymphs Abs: 2.4 10*3/uL (ref 0.7–4.0)
MCH: 28.6 pg (ref 26.0–34.0)
MCHC: 33.8 g/dL (ref 30.0–36.0)
MCV: 84.6 fL (ref 80.0–100.0)
Monocytes Absolute: 0.7 10*3/uL (ref 0.1–1.0)
Monocytes Relative: 6 %
Neutro Abs: 9.5 10*3/uL — ABNORMAL HIGH (ref 1.7–7.7)
Neutrophils Relative %: 73 %
Platelet Count: 311 10*3/uL (ref 150–400)
RBC: 4.86 MIL/uL (ref 4.22–5.81)
RDW: 15.1 % (ref 11.5–15.5)
WBC Count: 12.8 10*3/uL — ABNORMAL HIGH (ref 4.0–10.5)
nRBC: 0 % (ref 0.0–0.2)

## 2021-10-18 LAB — PROTIME-INR
INR: 1 (ref 0.8–1.2)
Prothrombin Time: 12.7 seconds (ref 11.4–15.2)

## 2021-10-18 LAB — CMP (CANCER CENTER ONLY)
ALT: 27 U/L (ref 0–44)
AST: 24 U/L (ref 15–41)
Albumin: 4.2 g/dL (ref 3.5–5.0)
Alkaline Phosphatase: 83 U/L (ref 38–126)
Anion gap: 12 (ref 5–15)
BUN: 5 mg/dL — ABNORMAL LOW (ref 6–20)
CO2: 21 mmol/L — ABNORMAL LOW (ref 22–32)
Calcium: 9.8 mg/dL (ref 8.9–10.3)
Chloride: 107 mmol/L (ref 98–111)
Creatinine: 1.19 mg/dL (ref 0.61–1.24)
GFR, Estimated: >60 mL/min (ref >60)
Glucose, Bld: 118 mg/dL — ABNORMAL HIGH (ref 70–99)
Potassium: 3.9 mmol/L (ref 3.5–5.1)
Sodium: 140 mmol/L (ref 135–145)
Total Bilirubin: 0.4 mg/dL (ref 0.3–1.2)
Total Protein: 7.6 g/dL (ref 6.5–8.1)

## 2021-10-19 ENCOUNTER — Telehealth: Payer: Self-pay

## 2021-10-19 DIAGNOSIS — I82451 Acute embolism and thrombosis of right peroneal vein: Secondary | ICD-10-CM | POA: Insufficient documentation

## 2021-10-19 DIAGNOSIS — Z86711 Personal history of pulmonary embolism: Secondary | ICD-10-CM | POA: Insufficient documentation

## 2021-10-19 HISTORY — DX: Acute embolism and thrombosis of right peroneal vein: I82.451

## 2021-10-19 LAB — ANTITHROMBIN III ANTIGEN: AT III AG PPP IMM-ACNC: 98 % (ref 72–124)

## 2021-10-19 MED ORDER — WARFARIN SODIUM 5 MG PO TABS: 5.0000 mg | ORAL_TABLET | Freq: Every day | ORAL | 0 refills | Status: DC

## 2021-10-19 NOTE — Telephone Encounter (Signed)
LM for Dr Avbuere's nurse with recommendations

## 2021-10-19 NOTE — Progress Notes (Signed)
[place] [phone]   Fax:([phone] [place] NOTE  Patient Care Team: Pa[facility] as PCP - General (Internal Medicine)  Hematological/Oncological History 1) March 2009: Diagnosed with pulmonary embolism with right heart strain. Started on anticoagulation with coumadin 2) October 2014: Developed subdural hematoma. INR 5.07 while on coumadin. Neurosurgery requested to hold anticoagulation. Coagulapathy was reversed with vitamin K and FFP. IV filter was placed by IR.  3) November 2014: Underwent right front parietal craniotomy with subdural hematoma evacuation. 4) 09/23/2021: While on Eliquis therapy (duration unknown), developed acute DVT in right peroneal veins. Switch to Lovenox 1 mg/kg twice daily 5) 10/12/2021: Returned to ED for worsening right leg pain. Repeat doppler US showed new clot progression further into the left peroneal vein as compared to 09/23/2021 study.  6) 10/18/2021: Establish care with [facility] Hematology with Dr. Lorenso Courier and Dede Query PA-C  CHIEF COMPLAINTS/PURPOSE OF CONSULTATION:  "History of PE and recent right lower extremity DVT "  HISTORY OF PRESENTING ILLNESS:  [name] 56 y.o. male with medical history significant for bipolar 1 disorder, pulmonary embolism, obstructive sleep apnea and subdural hematoma status post evacuation.  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  He is accompanied by his girlfriend for this visit.  On exam today, Mr. Marck reports he continues to have right lower extremity pain.  He started to wear a compression stocking as instructed by the ED team with no improvement of pain or swelling.  He reports fatigue but is able to complete his daily activities on his own.  He has decreased appetite and has lost 4 to 5 pounds in the last few weeks.  He denies any nausea, vomiting or abdominal pain.  His bowel habits are unchanged  without any recurrent episodes of diarrhea or constipation.  Patient reports occasional episodes of chest discomfort that has been ongoing for several weeks.  He has some shortness of breath with exertion but none at rest.  Patient denies any fevers, chills, night sweats, cough, peripheral neuropathy, easy bruising or bleeding.  He has been compliant with taking Lovenox twice a day as prescribed.  He has no other complaints.  Rest of the 10 point ROS is below.  MEDICAL HISTORY:  Past Medical History:  Diagnosis Date   Achilles rupture, left    Bipolar 1 disorder ([place])    Dental caries    periodontitis   Pneumonia    Pulmonary embolism ([place]) 05/18/2007   Sleep apnea    wears CPAP   Subdural hematoma ([place]) 01/04/2013   in setting of supratherapeutic INR    SURGICAL HISTORY: Past Surgical History:  Procedure Laterality Date   ACHILLES TENDON SURGERY Left 10/21/2014   Procedure: Left Achilles Reconstruction;  Surgeon: Newt Minion, MD;  Location: [place];  Service: Orthopedics;  Laterality: Left;   APPENDECTOMY     CARDIAC CATHETERIZATION  03/04/2018   [facility]: Normal coronaries, LVEF estimated at 40%, medical Rx   CRANIOTOMY N/A 01/19/2013   Procedure: CRANIOTOMY HEMATOMA EVACUATION SUBDURAL;  Surgeon: Hosie Spangle, MD;  Location: [place] NEURO ORS;  Service: Neurosurgery;  Laterality: N/A;   CYSTECTOMY     right head   ELBOW SURGERY     right   FRACTURE SURGERY     finger   I & D EXTREMITY Left 12/07/2016   Procedure: LEFT ACHILLES DEBRIDEMENT;  Surgeon: Newt Minion, MD;  Location: [place];  Service: Orthopedics;  Laterality: Left;   LUMBAR FUSION  12/23/2017   L5 GILL PROCEDURE, RIGHT L5-S1, TRANSFORAMIAL LUMBAR INTERBODY FUSION, BILATERAL LATERAL FUSION, PEDICLE INSTRUMENTATION   MULTIPLE EXTRACTIONS WITH ALVEOLOPLASTY N/A 03/07/2015   Procedure: MULTIPLE EXTRACTION WITH ALVEOLOPLASTY;  Surgeon: Diona Browner, DDS;  Location: [place];  Service: Oral Surgery;  Laterality: N/A;    SPINAL CORD STIMULATOR INSERTION N/A 05/18/2019   Procedure: LUMBAR SPINAL CORD STIMULATOR INSERTION;  Surgeon: Clydell Hakim, MD;  Location: [place];  Service: Neurosurgery;  Laterality: N/A;  Thoracic/Lumbar   SPINAL CORD STIMULATOR INSERTION N/A 04/06/2020   Procedure: Revision of spinal cord stimulator;  Surgeon: Reece Agar, MD;  Location: [place];  Service: Neurosurgery;  Laterality: N/A;    SOCIAL HISTORY: Social History   Socioeconomic History   Marital status: Single    Spouse name: Not on file   Number of children: Not on file   Years of education: Not on file   Highest education level: Not on file  Occupational History   Occupation: disability  Tobacco Use   Smoking status: Never   Smokeless tobacco: Never  Vaping Use   Vaping Use: Never used  Substance and Sexual Activity   Alcohol use: No   Drug use: No   Sexual activity: Not on file  Other Topics Concern   Not on file  Social History Narrative   FROM [place], [place]    Lives in a 2 story apartment.  His cousin is currently staying with him.  Has 4 children.  On disability for the past 10 years for pulmonary embolism, bipolar disease.  Education: high school.   Social Determinants of Health   Financial Resource Strain: Not on file  Food Insecurity: Not on file  Transportation Needs: Not on file  Physical Activity: Not on file  Stress: Not on file  Social Connections: Not on file  Intimate Partner Violence: Not on file    FAMILY HISTORY: Family History  Problem Relation Age of Onset   Hypertension Mother    Stroke Mother    Multiple sclerosis Sister    Down syndrome Son     ALLERGIES:  is allergic to lyrica [pregabalin].  MEDICATIONS:  Current Outpatient Medications  Medication Sig Dispense Refill   acetaminophen (TYLENOL) 500 MG tablet Take 1 tablet (500 mg total) by mouth every 6 (six) hours as needed. 30 tablet 0   albuterol (VENTOLIN HFA) 108 (90 Base) MCG/ACT inhaler Inhale 1-2 puffs into the  lungs every 6 (six) hours as needed for wheezing or shortness of breath. 1 each 0   azithromycin (ZITHROMAX) 250 MG tablet Take 1 tablet (250 mg total) by mouth daily. Take first 2 tablets together, then 1 every day until finished. 6 tablet 0   cyclobenzaprine (FLEXERIL) 10 MG tablet Take 1 tablet (10 mg total) by mouth 3 (three) times daily as needed for muscle spasms. 15 tablet 0   doxepin (SINEQUAN) 75 MG capsule Take 75 mg by mouth at bedtime.     enoxaparin (LOVENOX) 100 MG/ML injection Inject 1 mL (100 mg total) into the skin every 12 (twelve) hours. 60 mL 0   hydrOXYzine (ATARAX/VISTARIL) 25 MG tablet Take 25 mg by mouth at bedtime.     lithium 600 MG capsule Take 600 mg by mouth at bedtime.     lithium carbonate (ESKALITH) 450 MG CR tablet Take 450 mg by mouth daily.     mirtazapine (REMERON) 45 MG tablet Take 45 mg by mouth at bedtime.  predniSONE (DELTASONE) 20 MG tablet Take 2 tablets (40 mg total) by mouth daily. 6 tablet 0   tiZANidine (ZANAFLEX) 4 MG tablet Take 4 mg by mouth at bedtime.      warfarin (COUMADIN) 5 MG tablet Take 1 tablet (5 mg total) by mouth daily. 30 tablet 0   No current facility-administered medications for this visit.    REVIEW OF SYSTEMS:   Constitutional: ( - ) fevers, ( - )  chills , ( - ) night sweats Eyes: ( - ) blurriness of vision, ( - ) double vision, ( - ) watery eyes Ears, nose, mouth, throat, and face: ( - ) mucositis, ( - ) sore throat Respiratory: ( - ) cough, ( - ) dyspnea, ( - ) wheezes Cardiovascular: ( - ) palpitation, ( +) chest discomfort, ( + ) lower extremity swelling Gastrointestinal:  ( - ) nausea, ( - ) heartburn, ( - ) change in bowel habits Skin: ( - ) abnormal skin rashes Lymphatics: ( - ) new lymphadenopathy, ( - ) easy bruising Neurological: ( - ) numbness, ( - ) tingling, ( - ) new weaknesses Behavioral/Psych: ( - ) mood change, ( - ) new changes  All other systems were reviewed with the patient and are  negative.  PHYSICAL EXAMINATION: ECOG PERFORMANCE STATUS: 1 - Symptomatic but completely ambulatory  Vitals:   10/18/21 1218  BP: (!) 150/90  Pulse: (!) 55  Resp: 18  Temp: 98.7 F (37.1 C)  SpO2: 95%   Filed Weights   10/18/21 1218  Weight: 230 lb 8 oz (104.6 kg)    GENERAL: well appearing male in NAD  SKIN: skin color, texture, turgor are normal, no rashes or significant lesions EYES: conjunctiva are pink and non-injected, sclera clear OROPHARYNX: no exudate, no erythema; lips, buccal mucosa, and tongue normal  NECK: supple, non-tender LYMPH:  no palpable lymphadenopathy in the cervical or supraclavicular lymph nodes.  LUNGS: clear to auscultation and percussion with normal breathing effort HEART: regular rate & rhythm and no murmurs. Right lower extremity edema with tenderness to palpation in the calf. ABDOMEN: soft, non-tender, non-distended, normal bowel sounds Musculoskeletal: no cyanosis of digits and no clubbing  PSYCH: alert & oriented x 3, fluent speech NEURO: no focal motor/sensory deficits  LABORATORY DATA:  I have reviewed the data as listed    Latest Ref Rng & Units 10/18/2021    1:49 PM 10/12/2021    1:20 PM 10/11/2021   12:00 AM  CBC  WBC 4.0 - 10.5 K/uL 12.8  9.2  10.7   Hemoglobin 13.0 - 17.0 g/dL 13.9  12.6  12.8   Hematocrit 39.0 - 52.0 % 41.1  38.7  38.8   Platelets 150 - 400 K/uL 311  280  295        Latest Ref Rng & Units 10/18/2021    1:49 PM 10/12/2021    1:20 PM 10/11/2021   12:00 AM  CMP  Glucose 70 - 99 mg/dL 118  163  98   BUN 6 - 20 mg/dL 5  5  6    Creatinine 0.61 - 1.24 mg/dL 1.19  1.43  1.34   Sodium 135 - 145 mmol/L 140  141  142   Potassium 3.5 - 5.1 mmol/L 3.9  3.5  3.7   Chloride 98 - 111 mmol/L 107  109  106   CO2 22 - 32 mmol/L 21  23  26    Calcium 8.9 - 10.3 mg/dL 9.8  8.9  9.3  Total Protein 6.5 - 8.1 g/dL 7.6   7.2   Total Bilirubin 0.3 - 1.2 mg/dL 0.4   0.4   Alkaline Phos 38 - 126 U/L 83     AST 15 - 41 U/L 24   24    ALT 0 - 44 U/L 27   22    RADIOGRAPHIC STUDIES: I have personally reviewed the radiological images as listed and agreed with the findings in the report. CT Angio Chest PE W and/or Wo Contrast  Result Date: 10/12/2021 CLINICAL DATA:  Pulmonary embolus suspected with high probability. Right leg pain and cramping in the calf. Diagnosed with DVT 3 weeks ago. Lovenox shots. EXAM: CT ANGIOGRAPHY CHEST WITH CONTRAST TECHNIQUE: Multidetector CT imaging of the chest was performed using the standard protocol during bolus administration of intravenous contrast. Multiplanar CT image reconstructions and MIPs were obtained to evaluate the vascular anatomy. RADIATION DOSE REDUCTION: This exam was performed according to the departmental dose-optimization program which includes automated exposure control, adjustment of the mA and/or kV according to patient size and/or use of iterative reconstruction technique. CONTRAST:  100 mL Omnipaque 350 COMPARISON:  03/19/2021 FINDINGS: Cardiovascular: Good opacification of the central and segmental pulmonary arteries. No focal filling defects. No evidence of significant pulmonary embolus. Cardiac enlargement. No pericardial effusions. Normal caliber thoracic aorta. Mild aortic and coronary artery calcification. Mediastinum/Nodes: Mediastinal lymph nodes are not pathologically enlarged, likely reactive. Esophagus is decompressed. Thyroid gland is unremarkable. Lungs/Pleura: Motion artifact limits examination but there is evidence of patchy airspace disease in the lungs. This is mostly central and basilar. Appearance could indicate multifocal pneumonia or edema. No pleural effusions. No pneumothorax. Upper Abdomen: No acute abnormalities. Musculoskeletal: Spinal stimulator lead tips in the lower thoracic central canal. No acute bony abnormalities. Review of the MIP images confirms the above findings. IMPRESSION: 1. No evidence of significant pulmonary embolus. 2. Cardiac enlargement. 3.  Patchy airspace disease in the lungs, possibly multifocal pneumonia or edema. Electronically Signed   By: Burman Nieves M.D.   On: 10/12/2021 21:57   VAS [place] LOWER EXTREMITY VENOUS (DVT) (7a-7p)  Result Date: 10/12/2021  Lower Venous DVT Study Patient Name:  LENA FIELDHOUSE  Date of Exam:   10/12/2021 Medical Rec #: [id number]       Accession #:    [id number] Date of Birth: [birth year]       Patient Gender: M Patient Age:   45 years Exam Location:  [facility] Procedure:      VAS [place] LOWER EXTREMITY VENOUS (DVT) Referring Phys: Leonie Douglas SOTO --------------------------------------------------------------------------------  Indications: Right calf cramping.  Anticoagulation: Lovenox. Comparison Study: Prior study done 09/23/21 indicated acute DVT in the right                   peroneal vein Performing Technologist: Sherren Kerns RVS  Examination Guidelines: A complete evaluation includes B-mode imaging, spectral Doppler, color Doppler, and power Doppler as needed of all accessible portions of each vessel. Bilateral testing is considered an integral part of a complete examination. Limited examinations for reoccurring indications may be performed as noted. The reflux portion of the exam is performed with the patient in reverse Trendelenburg.  +---------+---------------+---------+-----------+----------+--------------+ RIGHT    CompressibilityPhasicitySpontaneityPropertiesThrombus Aging +---------+---------------+---------+-----------+----------+--------------+ CFV      Full           Yes      Yes                                 +---------+---------------+---------+-----------+----------+--------------+  SFJ      Full                                                        +---------+---------------+---------+-----------+----------+--------------+ FV Prox  Full                                                        +---------+---------------+---------+-----------+----------+--------------+  FV Mid   Full                                                        +---------+---------------+---------+-----------+----------+--------------+ FV DistalFull                                                        +---------+---------------+---------+-----------+----------+--------------+ PFV      Full                                                        +---------+---------------+---------+-----------+----------+--------------+ POP      Full           Yes      Yes                                 +---------+---------------+---------+-----------+----------+--------------+ PTV      Full                                                        +---------+---------------+---------+-----------+----------+--------------+ PERO     None           No       No                   Acute          +---------+---------------+---------+-----------+----------+--------------+   +----+---------------+---------+-----------+----------+--------------+ LEFTCompressibilityPhasicitySpontaneityPropertiesThrombus Aging +----+---------------+---------+-----------+----------+--------------+ CFV Full           Yes      Yes                                 +----+---------------+---------+-----------+----------+--------------+     Summary: RIGHT: - Findings consistent with acute deep vein thrombosis involving the right peroneal veins. - Findings suggest new clot progression as compared to previous examination. - Peroneal DVT found 09/23/21 appears to have extended further into the peroneal vein.  LEFT: - No evidence of common femoral vein obstruction.  *See table(s) above for measurements and observations. Electronically  signed by Orlie Pollen on 10/12/2021 at 3:41:12 PM.    Final    DG Chest 1 View  Result Date: 10/12/2021 CLINICAL DATA:  Chest pain EXAM: CHEST  1 VIEW COMPARISON:  None Available. FINDINGS: Normal mediastinum and cardiac silhouette. Normal pulmonary vasculature. No evidence  of effusion, infiltrate, or pneumothorax. No acute bony abnormality. Spinal stimulation device noted IMPRESSION: No acute cardiopulmonary process. Electronically Signed   By: Suzy Bouchard M.D.   On: 10/12/2021 13:32   VAS [place] LOWER EXTREMITY VENOUS (DVT) (ONLY [facility] & [facility])  Result Date: 09/25/2021  Lower Venous DVT Study Patient Name:  TENZING LINDOR  Date of Exam:   09/23/2021 Medical Rec #: [id number]       Accession #:    [id number] Date of Birth: 1965/07/03       Patient Gender: M Patient Age:   63 years Exam [facility]  [facility] Procedure:      VAS [place] LOWER EXTREMITY VENOUS (DVT) Referring Phys: Davonna Belling --------------------------------------------------------------------------------  Indications: Pain//cramping.  Risk Factors: DVT 10/21/2018 right peroneal. Comparison Study: Prior negative right LEV done 01/03/20 Performing Technologist: Sharion Dove RVS  Examination Guidelines: A complete evaluation includes B-mode imaging, spectral Doppler, color Doppler, and power Doppler as needed of all accessible portions of each vessel. Bilateral testing is considered an integral part of a complete examination. Limited examinations for reoccurring indications may be performed as noted. The reflux portion of the exam is performed with the patient in reverse Trendelenburg.  +---------+---------------+---------+-----------+----------+--------------+ RIGHT    CompressibilityPhasicitySpontaneityPropertiesThrombus Aging +---------+---------------+---------+-----------+----------+--------------+ CFV      Full           Yes      Yes                                 +---------+---------------+---------+-----------+----------+--------------+ SFJ      Full                                                        +---------+---------------+---------+-----------+----------+--------------+ FV Prox  Full                                                         +---------+---------------+---------+-----------+----------+--------------+ FV Mid   Full                                                        +---------+---------------+---------+-----------+----------+--------------+ FV DistalFull                                                        +---------+---------------+---------+-----------+----------+--------------+ PFV      Full                                                        +---------+---------------+---------+-----------+----------+--------------+  POP      Full           Yes      Yes                                 +---------+---------------+---------+-----------+----------+--------------+ PTV      Full                                                        +---------+---------------+---------+-----------+----------+--------------+ PERO     None                                                        +---------+---------------+---------+-----------+----------+--------------+ Gastroc  Full           No       No                   Acute          +---------+---------------+---------+-----------+----------+--------------+   +----+---------------+---------+-----------+----------+--------------+ LEFTCompressibilityPhasicitySpontaneityPropertiesThrombus Aging +----+---------------+---------+-----------+----------+--------------+ CFV Full           Yes      Yes                                 +----+---------------+---------+-----------+----------+--------------+    Summary: RIGHT: - Findings consistent with acute deep vein thrombosis involving the right peroneal veins. - No cystic structure found in the popliteal fossa.  LEFT: - No evidence of common femoral vein obstruction.  *See table(s) above for measurements and observations. Electronically signed by Monica Martinez MD on 09/25/2021 at 1:39:30 PM.    Final     ASSESSMENT & PLAN DANNYE KENNY is a 56 y.o. male who presents to the clinc for history  of pulmonary embolism and recent acute DVT of right lower extremity. We reviewed provoking factors that can cause VTEs includin prolonged travel/immobility, surgery (particular abdominal or orthropedic), trauma,  and pregnancy/ estrogen containing birth control. After a detailed history and review of the records there is no clear provoking factor for this patient's VTE.  Patients with unprovoked VTEs have up to 25% recurrence after 5 years and 36% at 10 years, with 4% of these clots being fatal (BMJ [id number]). Therefore the formal recommendation for unprovoked VTE's is lifelong anticoagulation, as the cause may not be transient or reversible.   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  --Since there is evidence of clot progression seen on 10/12/2021 while on Lovenox therapy and patient continues to have worsening right leg pain, we recommend to switch to Coumadin.  --Recommend to start on Coumadin 5 mg PO daily and bridge with Lovenox 1 mg/kg twice daily x 5 days.  We will request patient's PCP, Dr. Nolene Ebbs to manage coumadin dosages.  --Labs today to check CBC, CMP, PT/INR. Additionally, we will order hypercoagulable workup to check lupus anticoagulant, anticardiolipin antibodies, beta-2 glycoprotein antibodies, protein C and S activity/levels. --patient denies any  bleeding, bruising, or dark stools while on anticoagulation. --RTC in 3 months' time with strict return precautions for overt signs of bleeding.    Orders Placed This Encounter  Procedures   CBC with Differential ([place] Only)    Standing Status:   Future    Number of Occurrences:   1    Standing Expiration Date:   10/19/2022   CMP ([place] only)    Standing Status:   Future    Number of Occurrences:   1    Standing Expiration Date:   10/19/2022    Protime-INR    Standing Status:   Future    Number of Occurrences:   1    Standing Expiration Date:   10/19/2022   Protein C activity   Protein C, total   Protein S activity   Protein S, total   Lupus anticoagulant panel   Beta-2-glycoprotein i abs, IgG/M/A   Prothrombin gene mutation   Cardiolipin antibodies, IgG, IgM, IgA   Antithrombin III antigen    Standing Status:   Future    Number of Occurrences:   1    Standing Expiration Date:   10/18/2022    All questions were answered. The patient knows to call the clinic with any problems, questions or concerns.  I have spent a total of 60 minutes minutes of face-to-face and non-face-to-face time, preparing to see the patient, obtaining and/or reviewing separately obtained history, performing a medically appropriate examination, counseling and educating the patient, ordering medications/test, referring and communicating with other health care professionals, documenting clinical information in the electronic health record and care coordination.   Dede Query, PA-C Department of Hematology/Oncology [facility] [place] at [facility] Phone: [phone]  Patient was seen with Dr. Lorenso Courier  I have read the above note and personally examined the patient. I agree with the assessment and plan as noted above.  Briefly Mr. Dofflemyer is a 56 year old male who has had failure of multiple attempts for right lower extremity DVT.  He was initially on Coumadin therapy which had to be discontinued due to having supratherapeutic INR and developing a brain bleed.  He was subsequent on Eliquis therapy but developed blood clot while on this treatment.  He was transitioned to Lovenox therapy and reports that he has compliant with it.  While on Lovenox for blood clot worsened on ultrasound.  Given the serial failures the next best option will be to transition the patient to Coumadin.  We will also need to reimage the leg to assure that the Coumadin  therapy is working.  Would recommend consideration of ultrasound in approximately 4 weeks time.  We unfortunately do not have a Coumadin clinic at our cancer center and therefore the patient will need to connect with a Coumadin clinic through his primary care provider.  In the event the patient continues to progress through Coumadin would need to consider Pradaxa therapy.  We will do a hypercoagulation work-up today to determine if he has an underlying disorder to explain these findings.  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  The patient voices understanding of the plan.     Ledell Peoples, MD Department of Hematology/Oncology [place] at [facility] Phone: [phone] Pager: [phone] Email: [email or web address]

## 2021-10-19 NOTE — Telephone Encounter (Signed)
Justin [name]: Can you reach out to patient's PCP, Dr. Fleet Contras P: [phone] to fax note. and notify that we started patient on coumadin therapy.We need PCP to manage coumadin dosages.Generally, need them to check INR within 3-4 days after starting.  Office note faxed and confirmation received

## 2021-10-20 LAB — LUPUS ANTICOAGULANT PANEL
DRVVT: 52.4 s — ABNORMAL HIGH (ref 0.0–47.0)
PTT Lupus Anticoagulant: 46.7 s — ABNORMAL HIGH (ref 0.0–43.5)

## 2021-10-20 LAB — BETA-2-GLYCOPROTEIN I ABS, IGG/M/A
Beta-2 Glyco I IgG: 11 GPI IgG units (ref 0–20)
Beta-2-Glycoprotein I IgA: 95 GPI IgA units — ABNORMAL HIGH (ref 0–25)
Beta-2-Glycoprotein I IgM: 71 GPI IgM units — ABNORMAL HIGH (ref 0–32)

## 2021-10-20 LAB — CARDIOLIPIN ANTIBODIES, IGG, IGM, IGA
Anticardiolipin IgA: <9 APL U/mL (ref 0–11)
Anticardiolipin IgG: <9 GPL U/mL (ref 0–14)
Anticardiolipin IgM: <9 MPL U/mL (ref 0–12)

## 2021-10-20 LAB — PROTEIN S, TOTAL: Protein S Ag, Total: 113 % (ref 60–150)

## 2021-10-20 LAB — PROTEIN C ACTIVITY: Protein C Activity: 115 % (ref 73–180)

## 2021-10-20 LAB — PROTEIN S ACTIVITY: Protein S Activity: 111 % (ref 63–140)

## 2021-10-20 LAB — DRVVT CONFIRM: dRVVT Confirm: 1.3 ratio — ABNORMAL HIGH (ref 0.8–1.2)

## 2021-10-20 LAB — PROTEIN C, TOTAL: Protein C, Total: 119 % (ref 60–150)

## 2021-10-20 LAB — DRVVT MIX: dRVVT Mix: 42.7 s — ABNORMAL HIGH (ref 0.0–40.4)

## 2021-10-20 LAB — HEXAGONAL PHASE PHOSPHOLIPID: Hexagonal Phase Phospholipid: 4 s (ref 0–11)

## 2021-10-20 LAB — PTT-LA MIX: PTT-LA Mix: 43.9 s — ABNORMAL HIGH (ref 0.0–40.5)

## 2021-10-20 NOTE — Telephone Encounter (Signed)
Spoke with pt and he started his coumadin yesterday and will call his PCP to set up an appt to have lab work on Monday to ck his INR.

## 2021-10-25 LAB — PROTHROMBIN GENE MUTATION

## 2021-10-26 ENCOUNTER — Telehealth: Payer: Self-pay | Admitting: Physician Assistant

## 2021-10-26 DIAGNOSIS — I82451 Acute embolism and thrombosis of right peroneal vein: Secondary | ICD-10-CM

## 2021-10-26 NOTE — Telephone Encounter (Signed)
I called [name] to review the lab results from  10/18/2021 that showed elevated beta-2-glycoprotein antibodies. Patient has started on coumadin as prescribed. Recommend to continue on coumadin and return in 12 weeks to repeat beta-2 glycoprotein levels. If persistently elevated, it will meet criteria for antiphospholipid syndrome.   Patient reports worsening right lower calf pain that has not improved since our visit. Recommend repeat doppler US tomorrow to further evaluate.

## 2021-10-27 ENCOUNTER — Ambulatory Visit (HOSPITAL_COMMUNITY)
Admission: RE | Admit: 2021-10-27 | Discharge: 2021-10-27 | Disposition: A | Discharge status: Home / Self Care | Payer: Medicaid Other | Source: Ambulatory Visit | Attending: Physician Assistant | Admitting: Physician Assistant

## 2021-10-27 DIAGNOSIS — I82451 Acute embolism and thrombosis of right peroneal vein: Secondary | ICD-10-CM | POA: Insufficient documentation

## 2021-10-27 NOTE — Progress Notes (Signed)
Right lower extremity venous duplex has been completed. Preliminary results can be found in CV Proc through chart review.  Results were given to Georga Kaufmann PA.  10/27/21 10:28 AM Olen Cordial RVT

## 2021-10-30 ENCOUNTER — Other Ambulatory Visit: Payer: Self-pay | Admitting: Internal Medicine

## 2021-10-30 ENCOUNTER — Telehealth: Payer: Self-pay | Admitting: Physician Assistant

## 2021-10-30 NOTE — Telephone Encounter (Signed)
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 He reports that pain in right leg is intermittent and seems better. Advised to monitor pain/swelling and follow up with any new symptoms are noted. He plans to follow up with PCP to check INR while on coumadin. He has no other questions/concerns. We will see him back in clinic in 3 months for a follow up.

## 2021-10-31 LAB — PROTIME-INR
INR: 2.9 — ABNORMAL HIGH
Prothrombin Time: 29.1 s — ABNORMAL HIGH (ref 9.0–11.5)

## 2021-11-03 ENCOUNTER — Other Ambulatory Visit: Payer: Self-pay

## 2021-11-03 ENCOUNTER — Emergency Department (HOSPITAL_COMMUNITY)
Admission: EM | Admit: 2021-11-03 | Discharge: 2021-11-04 | Disposition: A | Discharge status: Home / Self Care | Payer: Medicaid Other | Attending: Emergency Medicine | Admitting: Emergency Medicine

## 2021-11-03 ENCOUNTER — Encounter (HOSPITAL_COMMUNITY): Payer: Self-pay | Admitting: Emergency Medicine

## 2021-11-03 DIAGNOSIS — R0602 Shortness of breath: Secondary | ICD-10-CM | POA: Diagnosis present

## 2021-11-03 DIAGNOSIS — Z7901 Long term (current) use of anticoagulants: Secondary | ICD-10-CM | POA: Insufficient documentation

## 2021-11-03 DIAGNOSIS — Z20822 Contact with and (suspected) exposure to covid-19: Secondary | ICD-10-CM | POA: Insufficient documentation

## 2021-11-03 DIAGNOSIS — R051 Acute cough: Secondary | ICD-10-CM | POA: Insufficient documentation

## 2021-11-03 NOTE — ED Triage Notes (Signed)
Patient here with shortness of breath, states that he has had pneumonia before and his shortness of breath feels like it again.  Patient states that he has mild chest pain.  No nausea or vomiting.  Patient is on Coumadin for DVT.

## 2021-11-04 ENCOUNTER — Emergency Department (HOSPITAL_COMMUNITY): Payer: Medicaid Other

## 2021-11-04 LAB — CBC
HCT: 39.9 % (ref 39.0–52.0)
Hemoglobin: 13.1 g/dL (ref 13.0–17.0)
MCH: 28.8 pg (ref 26.0–34.0)
MCHC: 32.8 g/dL (ref 30.0–36.0)
MCV: 87.7 fL (ref 80.0–100.0)
Platelets: 338 10*3/uL (ref 150–400)
RBC: 4.55 MIL/uL (ref 4.22–5.81)
RDW: 14.8 % (ref 11.5–15.5)
WBC: 10.1 10*3/uL (ref 4.0–10.5)
nRBC: 0 % (ref 0.0–0.2)

## 2021-11-04 LAB — BASIC METABOLIC PANEL
Anion gap: 7 (ref 5–15)
BUN: <5 mg/dL — ABNORMAL LOW (ref 6–20)
CO2: 27 mmol/L (ref 22–32)
Calcium: 9.8 mg/dL (ref 8.9–10.3)
Chloride: 107 mmol/L (ref 98–111)
Creatinine, Ser: 1.39 mg/dL — ABNORMAL HIGH (ref 0.61–1.24)
GFR, Estimated: 59 mL/min — ABNORMAL LOW (ref >60)
Glucose, Bld: 109 mg/dL — ABNORMAL HIGH (ref 70–99)
Potassium: 3.6 mmol/L (ref 3.5–5.1)
Sodium: 141 mmol/L (ref 135–145)

## 2021-11-04 LAB — SARS CORONAVIRUS 2 BY RT PCR: SARS Coronavirus 2 by RT PCR: NEGATIVE

## 2021-11-04 LAB — PROTIME-INR
INR: 2.7 — ABNORMAL HIGH (ref 0.8–1.2)
Prothrombin Time: 28.4 seconds — ABNORMAL HIGH (ref 11.4–15.2)

## 2021-11-04 LAB — TROPONIN I (HIGH SENSITIVITY): Troponin I (High Sensitivity): 34 ng/L — ABNORMAL HIGH (ref <18)

## 2021-11-04 LAB — LITHIUM LEVEL: Lithium Lvl: 0.44 mmol/L — ABNORMAL LOW (ref 0.60–1.20)

## 2021-11-04 MED ORDER — DOXYCYCLINE HYCLATE 100 MG PO TABS
100.0000 mg | ORAL_TABLET | Freq: Once | ORAL | Status: AC
  Administered 2021-11-04: 100 mg via ORAL
  Filled 2021-11-04: qty 1

## 2021-11-04 MED ORDER — HYDROCODONE-ACETAMINOPHEN 5-325 MG PO TABS
1.0000 | ORAL_TABLET | Freq: Once | ORAL | Status: AC
  Administered 2021-11-04: 1 via ORAL
  Filled 2021-11-04: qty 1

## 2021-11-04 MED ORDER — DOXYCYCLINE HYCLATE 100 MG PO CAPS: 100.0000 mg | ORAL_CAPSULE | Freq: Two times a day (BID) | ORAL | 0 refills | Status: DC

## 2021-11-04 NOTE — ED Notes (Signed)
Ambulates in hall without assistance. O2 remains >97%- no complaints of SOB. Steady on feet. Feels rib cage pain relief from PO meds. Returns to bed without incident.

## 2021-11-04 NOTE — ED Provider Notes (Signed)
[facility] EMERGENCY DEPARTMENT Provider Note   CSN: [id number] Arrival date & time: 11/03/21  2320     History  Chief Complaint  Patient presents with   Shortness of Breath    Justin [name] is a 56 y.o. male.  The history is provided by the patient.  Shortness of Breath Patient with history of bipolar 1 disorder, VTE on Coumadin, previous subdural hematoma presents with concern for pneumonia.  He reports for the past 3 days has had increasing productive cough as well as shortness of breath.  He reports dyspnea on exertion.  He reports back pain with cough and shortness of breath.  No anterior chest pain.  Reports he had pneumonia several years ago and it feels similar to that episode.  He has recently had his medications adjusted due to failure of Eliquis.  He is currently on Coumadin. He reports chills and body aches at home   Past Medical History:  Diagnosis Date   Achilles rupture, left    Bipolar 1 disorder (HCC)    Dental caries    periodontitis   Pneumonia    Pulmonary embolism (HCC) 05/18/2007   Sleep apnea    wears CPAP   Subdural hematoma (HCC) 01/04/2013   in setting of supratherapeutic INR    Home Medications Prior to Admission medications   Medication Sig Start Date End Date Taking? Authorizing Provider  acetaminophen (TYLENOL) 500 MG tablet Take 1 tablet (500 mg total) by mouth every 6 (six) hours as needed. 08/28/19   Fawze, Mina A, PA-C  albuterol (VENTOLIN HFA) 108 (90 Base) MCG/ACT inhaler Inhale 1-2 puffs into the lungs every 6 (six) hours as needed for wheezing or shortness of breath. 03/21/21   Rhys Martini, PA-C  azithromycin (ZITHROMAX) 250 MG tablet Take 1 tablet (250 mg total) by mouth daily. Take first 2 tablets together, then 1 every day until finished. 03/19/21   Placido Sou, PA-C  cyclobenzaprine (FLEXERIL) 10 MG tablet Take 1 tablet (10 mg total) by mouth 3 (three) times daily as needed for muscle spasms. 09/12/20   Terrilee Files, MD  doxepin (SINEQUAN) 75 MG capsule Take 75 mg by mouth at bedtime. 12/14/19   [provider]  enoxaparin (LOVENOX) 100 MG/ML injection Inject 1 mL (100 mg total) into the skin every 12 (twelve) hours. 09/23/21   Benjiman Core, MD  hydrOXYzine (ATARAX/VISTARIL) 25 MG tablet Take 25 mg by mouth at bedtime. 08/22/19   [provider]  lithium 600 MG capsule Take 600 mg by mouth at bedtime.    [provider]  lithium carbonate (ESKALITH) 450 MG CR tablet Take 450 mg by mouth daily. 12/14/19   [provider]  mirtazapine (REMERON) 45 MG tablet Take 45 mg by mouth at bedtime.    [provider]  predniSONE (DELTASONE) 20 MG tablet Take 2 tablets (40 mg total) by mouth daily. 09/23/21   Benjiman Core, MD  tiZANidine (ZANAFLEX) 4 MG tablet Take 4 mg by mouth at bedtime.  12/17/19   [provider]  warfarin (COUMADIN) 5 MG tablet Take 1 tablet (5 mg total) by mouth daily. 10/19/21   Briant Cedar, PA-C      Allergies    Lyrica [pregabalin]    Review of Systems   Review of Systems  Respiratory:  Positive for shortness of breath.     Physical Exam Updated Vital Signs BP (!) 141/96   Pulse 89   Temp 99.3 F (37.4  C) (Oral)   Resp (!) 21   SpO2 98%  Physical Exam CONSTITUTIONAL: Well developed/well nourished HEAD: Normocephalic/atraumatic EYES: EOMI/PERRL ENMT: Mucous membranes moist, uvula midline no erythema or exudate NECK: supple no meningeal signs SPINE/BACK:entire spine nontender CV: S1/S2 noted, no murmurs/rubs/gallops noted LUNGS: Bowel basilar crackles, no acute distress ABDOMEN: soft, nontender, no rebound or guarding, bowel sounds noted throughout abdomen GU:no cva tenderness NEURO: Pt is awake/alert/appropriate, moves all extremitiesx4.  No facial droop.   EXTREMITIES: pulses normal/equal, full ROM, no significant lower extremity edema or tenderness SKIN: warm, color normal PSYCH: no abnormalities of mood  noted, alert and oriented to situation  ED Results / Procedures / Treatments   Labs (all labs ordered are listed, but only abnormal results are displayed) Labs Reviewed  BASIC METABOLIC PANEL - Abnormal; Notable for the following components:      Result Value   Glucose, Bld 109 (*)    BUN <5 (*)    Creatinine, Ser 1.39 (*)    GFR, Estimated 59 (*)    All other components within normal limits  PROTIME-INR - Abnormal; Notable for the following components:   Prothrombin Time 28.4 (*)    INR 2.7 (*)    All other components within normal limits  LITHIUM LEVEL - Abnormal; Notable for the following components:   Lithium Lvl 0.44 (*)    All other components within normal limits  TROPONIN I (HIGH SENSITIVITY) - Abnormal; Notable for the following components:   Troponin I (High Sensitivity) 34 (*)    All other components within normal limits  SARS CORONAVIRUS 2 BY RT PCR  CBC    EKG EKG Interpretation  Date/Time:  Friday November 03 2021 23:29:42 EDT Ventricular Rate:  93 PR Interval:  176 QRS Duration: 100 QT Interval:  406 QTC Calculation: 504 R Axis:   -39 Text Interpretation: Sinus rhythm with Premature atrial complexes Left axis deviation Minimal voltage criteria for LVH, may be normal variant ( Cornell product ) Nonspecific T wave abnormality No significant change since last tracing Confirmed by Ripley Fraise [phone]) on 11/04/2021 2:09:36 AM  Radiology DG Chest 2 View  Result Date: 11/04/2021 CLINICAL DATA:  Shortness of breath. EXAM: CHEST - 2 VIEW COMPARISON:  Chest radiograph dated 10/12/2021. FINDINGS: Faint bibasilar densities may be chronic or atelectasis. Developing infiltrate is not excluded. No focal consolidation, pleural effusion or pneumothorax. The cardiac silhouette is within normal limits. No acute osseous pathology. Midthoracic spinal stimulator. IMPRESSION: Faint bibasilar densities may be chronic or atelectasis. Developing infiltrate is not excluded.  Electronically Signed   By: Anner Crete M.D.   On: 11/04/2021 00:32    Procedures Procedures    Medications Ordered in ED Medications  doxycycline (VIBRA-TABS) tablet 100 mg (100 mg Oral Given 11/04/21 0209)  HYDROcodone-acetaminophen (NORCO/VICODIN) 5-325 MG per tablet 1 tablet (1 tablet Oral Given 11/04/21 0209)    ED Course/ Medical Decision Making/ A&P Clinical Course as of 11/04/21 0555  Sat Nov 04, 2021  0205 Troponin I (High Sensitivity)(!): 34 Chronically elevated troponin [DW]  0211 Patient insist that his symptoms are consistent with previous pneumonia.  He does have a history of VTE and recently placed on Coumadin.  He is therapeutic.  Patient did have crackles on exam and reports productive cough.  We will start antibiotics [DW]  0411 Patient feels improved.  No hypoxia or tachycardia.  He does have tenderness along his right side of his back which is where his pain is when he coughs or breathes.  Patient prefers to be discharged home.  Advised that we cannot completely rule out an acute PE without CT chest, but he declines.  He is already on Coumadin.  He will be placed on doxycycline for presumed pneumonia.  Discussed with pharmacy and there is no need for INR checks while on Doxy.  Discussed strict ER return precautions. [DW]    Clinical Course User Index [DW] Zadie Rhine, MD                           Medical Decision Making Amount and/or Complexity of Data Reviewed Labs: ordered. Decision-making details documented in ED Course. Radiology: ordered.  Risk Prescription drug management.   This patient presents to the ED for concern of shortness of breath, this involves an extensive number of treatment options, and is a complaint that carries with it a high risk of complications and morbidity.  The differential diagnosis includes but is not limited to Acute coronary syndrome, pneumonia, acute pulmonary edema, pneumothorax, acute anemia, pulmonary embolism     Comorbidities that complicate the patient evaluation: Patient's presentation is complicated by their history of VTE    Additional history obtained: Records reviewed  oncology notes reviewed  Lab Tests: I Ordered, and personally interpreted labs.  The pertinent results include: Chronically elevated troponin, therapeutic INR/Coumadin  Imaging Studies ordered: I ordered imaging studies including X-ray chest   I independently visualized and interpreted imaging which showed atelectasis versus infiltrate I agree with the radiologist interpretation  Cardiac Monitoring: The patient was maintained on a cardiac monitor.  I personally viewed and interpreted the cardiac monitor which showed an underlying rhythm of:  sinus rhythm  Medicines ordered and prescription drug management: I ordered medication including doxycycline and Vicodin for pain Reevaluation of the patient after these medicines showed that the patient    improved  Test Considered: Considered CT chest, patient without tachycardia or hypoxia and feels improved and he declined further work-up   Reevaluation: After the interventions noted above, I reevaluated the patient and found that they have :improved  Complexity of problems addressed: Patient's presentation is most consistent with  acute presentation with potential threat to life or bodily function  Disposition: After consideration of the diagnostic results and the patient's response to treatment,  I feel that the patent would benefit from discharge   .           Final Clinical Impression(s) / ED Diagnoses Final diagnoses:  Acute cough  Shortness of breath    Rx / DC Orders ED Discharge Orders          Ordered    doxycycline (VIBRAMYCIN) 100 MG capsule  2 times daily        11/04/21 0350              Zadie Rhine, MD 11/04/21 [phone]

## 2021-11-16 ENCOUNTER — Other Ambulatory Visit: Payer: Self-pay | Admitting: Physician Assistant

## 2021-11-21 ENCOUNTER — Other Ambulatory Visit: Payer: Self-pay | Admitting: Physician Assistant

## 2021-11-30 ENCOUNTER — Emergency Department (EMERGENCY_DEPARTMENT_HOSPITAL): Payer: Medicaid Other

## 2021-11-30 ENCOUNTER — Emergency Department (HOSPITAL_COMMUNITY)
Admission: EM | Admit: 2021-11-30 | Discharge: 2021-11-30 | Disposition: A | Discharge status: Home / Self Care | Payer: Medicaid Other | Attending: Student | Admitting: Student

## 2021-11-30 ENCOUNTER — Other Ambulatory Visit: Payer: Self-pay

## 2021-11-30 DIAGNOSIS — M79604 Pain in right leg: Secondary | ICD-10-CM | POA: Insufficient documentation

## 2021-11-30 DIAGNOSIS — Z5321 Procedure and treatment not carried out due to patient leaving prior to being seen by health care provider: Secondary | ICD-10-CM | POA: Diagnosis not present

## 2021-11-30 DIAGNOSIS — M79661 Pain in right lower leg: Secondary | ICD-10-CM

## 2021-11-30 DIAGNOSIS — Z86718 Personal history of other venous thrombosis and embolism: Secondary | ICD-10-CM | POA: Diagnosis not present

## 2021-11-30 NOTE — ED Provider Triage Note (Signed)
Emergency Medicine Provider Triage Evaluation Note  Justin [name] , a 56 y.o. male  was evaluated in triage.  Pt complains of right sided paraspinal pain that radiates down his leg posteriorly.  He is also having calf tenderness for the last 2 days.  Denies any chest pain or shortness of breath, he is on Coumadin.  States the pain is a feels more like previous blood clot, denies any recent falls or injuries to his back.  No previous spinal surgeries, denies any urinary retention, fecal incontinence..  No hematuria, no nausea, no vomiting, dysuria, fevers.   Review of Systems  Per HPI  Physical Exam  BP (!) 135/90 (BP Location: Left Arm)   Pulse (!) 107   Temp 98.2 F (36.8 C)   Resp 16   SpO2 96%  Gen:   Awake, no distress   Resp:  Normal effort  MSK:   Moves extremities without difficulty  Other:  Cranial nerves II through XII are grossly intact.  Upper and lower extremity strength roughly symmetric.  Patient has posterior calf tenderness, pulses intact bilaterally.  No midline tenderness to the lumbar spine, pain is paraspinal to the right.  Medical Decision Making  Medically screening exam initiated at 5:24 PM.  Appropriate orders placed.  Justin [name] was informed that the remainder of the evaluation will be completed by another provider, this initial triage assessment does not replace that evaluation, and the importance of remaining in the ED until their evaluation is complete.  Given calf tenderness and h/o of prior blood clots while anticoagulated will r/o DVT   [name], PA-C 11/30/21 1725

## 2021-11-30 NOTE — Progress Notes (Signed)
Lower extremity venous right study completed.  Preliminary results relayed to Sage, PA.  See CV Proc for preliminary results report.   Makeila Yamaguchi, RDMS, RVT   

## 2021-11-30 NOTE — ED Triage Notes (Signed)
Pt c/o sudden-onset R lower back pain x2 days, states it shoots down R leg. Hx "issues w S1 joint but no surgery yet." States he has follow up appt on the 27th

## 2021-11-30 NOTE — ED Notes (Signed)
Pt advised this RN that he was tired of waiting and ready to eat; pt seen leaving lobby ambulatory, even gait, no distress noted.

## 2021-12-01 ENCOUNTER — Emergency Department (HOSPITAL_COMMUNITY): Payer: Medicaid Other

## 2021-12-01 ENCOUNTER — Encounter (HOSPITAL_COMMUNITY): Payer: Medicaid Other

## 2021-12-01 ENCOUNTER — Emergency Department (HOSPITAL_COMMUNITY)
Admission: EM | Admit: 2021-12-01 | Discharge: 2021-12-01 | Disposition: A | Discharge status: Home / Self Care | Payer: Medicaid Other | Attending: Emergency Medicine | Admitting: Emergency Medicine

## 2021-12-01 DIAGNOSIS — M5441 Lumbago with sciatica, right side: Secondary | ICD-10-CM | POA: Diagnosis not present

## 2021-12-01 DIAGNOSIS — Z7901 Long term (current) use of anticoagulants: Secondary | ICD-10-CM | POA: Insufficient documentation

## 2021-12-01 DIAGNOSIS — Z79899 Other long term (current) drug therapy: Secondary | ICD-10-CM | POA: Diagnosis not present

## 2021-12-01 DIAGNOSIS — G8929 Other chronic pain: Secondary | ICD-10-CM

## 2021-12-01 DIAGNOSIS — M545 Low back pain, unspecified: Secondary | ICD-10-CM | POA: Diagnosis present

## 2021-12-01 MED ORDER — HYDROCODONE-ACETAMINOPHEN 5-325 MG PO TABS
1.0000 | ORAL_TABLET | Freq: Once | ORAL | Status: AC
  Administered 2021-12-01: 1 via ORAL
  Filled 2021-12-01: qty 1

## 2021-12-01 NOTE — ED Notes (Signed)
Pt went to move his car, will be coming back.

## 2021-12-01 NOTE — ED Provider Notes (Signed)
[facility] EMERGENCY DEPARTMENT Provider Note  CSN: [id number] Arrival date & time: 12/01/21 1101  Chief Complaint(s) Back Pain  HPI Justin [name] is a 56 y.o. male with history of bipolar disorder, pulmonary embolism on anticoagulation presenting to the emergency department with back pain.  He reports back pain for months, worse in the past few days.  No numbness or tingling.  No bowel or bladder incontinence.  No history of IV drug use.  No fevers or chills.  No weakness.  Has been able to ambulate.  Worse with laying on the right side.  He also complained of right calf pain, was in the emergency department yesterday had a DVT ultrasound which was negative.  He reports he does not take any medicine at home for his pain   Past Medical History Past Medical History:  Diagnosis Date   Achilles rupture, left    Bipolar 1 disorder (HCC)    Dental caries    periodontitis   Pneumonia    Pulmonary embolism (HCC) 05/18/2007   Sleep apnea    wears CPAP   Subdural hematoma (HCC) 01/04/2013   in setting of supratherapeutic INR   Patient Active Problem List   Diagnosis Date Noted   Acute deep vein thrombosis (DVT) of right peroneal vein (HCC) 10/19/2021   History of pulmonary embolism 10/19/2021   Status post lumbar spinal fusion 01/03/2018   Lumbar stenosis 12/23/2017   Spondylolisthesis, lumbar region 06/25/2017   Achilles tendinitis of left lower extremity 12/07/2016   Achilles tendinitis, left leg 05/17/2016   Obstructive sleep apnea on CPAP 03/07/2015   Post-operative state 03/07/2015   Achilles rupture, left 10/21/2014   OSA (obstructive sleep apnea) 04/23/2013   Bipolar disorder, unspecified (HCC) 01/07/2013   Sinus tachycardia 01/07/2013   Headache(784.0) 01/07/2013   SDH (subdural hematoma) (HCC) 01/04/2013   H/O PE 01/04/2013   Chronic anticoagulation 01/04/2013   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   Acute sinusitis 01/04/2013   Home  Medication(s) Prior to Admission medications   Medication Sig Start Date End Date Taking? Authorizing Provider  acetaminophen (TYLENOL) 500 MG tablet Take 1 tablet (500 mg total) by mouth every 6 (six) hours as needed. 08/28/19   Fawze, Mina A, PA-C  albuterol (VENTOLIN HFA) 108 (90 Base) MCG/ACT inhaler Inhale 1-2 puffs into the lungs every 6 (six) hours as needed for wheezing or shortness of breath. 03/21/21   Rhys Martini, PA-C  azithromycin (ZITHROMAX) 250 MG tablet Take 1 tablet (250 mg total) by mouth daily. Take first 2 tablets together, then 1 every day until finished. 03/19/21   Placido Sou, PA-C  cyclobenzaprine (FLEXERIL) 10 MG tablet Take 1 tablet (10 mg total) by mouth 3 (three) times daily as needed for muscle spasms. 09/12/20   Terrilee Files, MD  doxepin (SINEQUAN) 75 MG capsule Take 75 mg by mouth at bedtime. 12/14/19   [provider]  doxycycline (VIBRAMYCIN) 100 MG capsule Take 1 capsule (100 mg total) by mouth 2 (two) times daily. One po bid x 7 days 11/04/21   Zadie Rhine, MD  enoxaparin (LOVENOX) 100 MG/ML injection Inject 1 mL (100 mg total) into the skin every 12 (twelve) hours. 09/23/21   Benjiman Core, MD  hydrOXYzine (ATARAX/VISTARIL) 25 MG tablet Take 25 mg by mouth at bedtime. 08/22/19   [provider]  lithium 600 MG capsule Take 600 mg by mouth at bedtime.    [provider]  lithium carbonate (ESKALITH) 450 MG CR tablet Take  450 mg by mouth daily. 12/14/19   [provider]  mirtazapine (REMERON) 45 MG tablet Take 45 mg by mouth at bedtime.    [provider]  predniSONE (DELTASONE) 20 MG tablet Take 2 tablets (40 mg total) by mouth daily. 09/23/21   Benjiman Core, MD  tiZANidine (ZANAFLEX) 4 MG tablet Take 4 mg by mouth at bedtime.  12/17/19   [provider]  warfarin (COUMADIN) 5 MG tablet Take 1 tablet (5 mg total) by mouth daily. 10/19/21   Raymondo Band                                                                                                                                     Past Surgical History Past Surgical History:  Procedure Laterality Date   ACHILLES TENDON SURGERY Left 10/21/2014   Procedure: Left Achilles Reconstruction;  Surgeon: Nadara Mustard, MD;  Location: [facility] OR;  Service: Orthopedics;  Laterality: Left;   APPENDECTOMY     CARDIAC CATHETERIZATION  03/04/2018   [facility]: Normal coronaries, LVEF estimated at 40%, medical Rx   CRANIOTOMY N/A 01/19/2013   Procedure: CRANIOTOMY HEMATOMA EVACUATION SUBDURAL;  Surgeon: Hewitt Shorts, MD;  Location: [facility];  Service: Neurosurgery;  Laterality: N/A;   CYSTECTOMY     right head   ELBOW SURGERY     right   FRACTURE SURGERY     finger   I & D EXTREMITY Left 12/07/2016   Procedure: LEFT ACHILLES DEBRIDEMENT;  Surgeon: Nadara Mustard, MD;  Location: [facility] OR;  Service: Orthopedics;  Laterality: Left;   LUMBAR FUSION  12/23/2017   L5 GILL PROCEDURE, RIGHT L5-S1, TRANSFORAMIAL LUMBAR INTERBODY FUSION, BILATERAL LATERAL FUSION, PEDICLE INSTRUMENTATION   MULTIPLE EXTRACTIONS WITH ALVEOLOPLASTY N/A 03/07/2015   Procedure: MULTIPLE EXTRACTION WITH ALVEOLOPLASTY;  Surgeon: [name], DDS;  Location: [facility] OR;  Service: Oral Surgery;  Laterality: N/A;   SPINAL CORD STIMULATOR INSERTION N/A 05/18/2019   Procedure: LUMBAR SPINAL CORD STIMULATOR INSERTION;  Surgeon: Odette Fraction, MD;  Location: [facility] OR;  Service: Neurosurgery;  Laterality: N/A;  Thoracic/Lumbar   SPINAL CORD STIMULATOR INSERTION N/A 04/06/2020   Procedure: Revision of spinal cord stimulator;  Surgeon: Renaldo Fiddler, MD;  Location: [facility] OR;  Service: Neurosurgery;  Laterality: N/A;   Family History Family History  Problem Relation Age of Onset   Hypertension Mother    Stroke Mother    Multiple sclerosis Sister    Down syndrome Son     Social History Social History   Tobacco Use   Smoking status: Never   Smokeless tobacco: Never  Vaping Use    Vaping Use: Never used  Substance Use Topics   Alcohol use: No   Drug use: No   Allergies Lyrica [pregabalin]  Review of Systems Review of Systems  All other systems reviewed and are negative.   Physical Exam Vital Signs  I have reviewed  the triage vital signs BP 116/78   Pulse 78   Temp 98.5 F (36.9 C)   Resp 20   SpO2 96%  Physical Exam Vitals and nursing note reviewed.  Constitutional:      General: He is not in acute distress.    Appearance: Normal appearance.  HENT:     Mouth/Throat:     Mouth: Mucous membranes are moist.  Eyes:     Conjunctiva/sclera: Conjunctivae normal.  Cardiovascular:     Rate and Rhythm: Normal rate and regular rhythm.  Pulmonary:     Effort: Pulmonary effort is normal. No respiratory distress.     Breath sounds: Normal breath sounds.  Abdominal:     General: Abdomen is flat.     Palpations: Abdomen is soft.     Tenderness: There is no abdominal tenderness.  Musculoskeletal:     Right lower leg: No edema.     Left lower leg: No edema.     Comments: Mild paraspinal right-sided lumbar tenderness  Skin:    General: Skin is warm and dry.     Capillary Refill: Capillary refill takes less than 2 seconds.  Neurological:     Mental Status: He is alert and oriented to person, place, and time. Mental status is at baseline.     Comments: Strength 5 out of 5 in the bilateral lower extremities with hip flexion, knee extension, ankle dorsi and plantarflexion.  No sensory deficit to light touch  Psychiatric:        Mood and Affect: Mood normal.        Behavior: Behavior normal.     ED Results and Treatments Labs (all labs ordered are listed, but only abnormal results are displayed) Labs Reviewed - No data to display                                                                                                                        Radiology DG Lumbar Spine Complete  Result Date: 12/01/2021 CLINICAL DATA:  Low back pain EXAM: LUMBAR  SPINE - COMPLETE 4+ VIEW COMPARISON:  None Available. FINDINGS: 5 nonrib bearing lumbar-type vertebral bodies. Vertebral body heights are maintained. No acute fracture. No static listhesis. No spondylolysis. Posterior lumbar interbody fusion at L5-S1. Mild degenerative disease with disc height loss at L4-5. Insert until note made of a spinal stimulator. IVC filter with the tip at the level of L2. SI joints are unremarkable. IMPRESSION: 1. Posterior lumbar interbody fusion at L5-S1. 2. Mild degenerative disease with disc height loss at L4-5. Electronically Signed   By: Elige Ko M.D.   On: 12/01/2021 12:22    Pertinent labs & imaging results that were available during my care of the patient were reviewed by me and considered in my medical decision making (see MDM for details).  Medications Ordered in ED Medications  HYDROcodone-acetaminophen (NORCO/VICODIN) 5-325 MG per tablet 1 tablet (has no administration in time range)  Procedures Procedures  (including critical care time)  Medical Decision Making / ED Course   MDM:  56 year old male presenting with acute on chronic back pain.  Patient overall well-appearing, vital signs reassuring.  History not concerning for cord compression, acute fracture, occult infectious process.  Exam overall reassuring with normal strength and sensation.  Suspect muscular strain versus possible sciatica given radiation down the right leg.  Discussed home pain control with Tylenol and Motrin.  Advise follow-up with a spinal surgeon as patient reports he has already tried physical therapy without significant improvement.  Although patient complained of right calf tenderness, there is no significant erythema, tenderness, warmth on exam and had negative DVT ultrasound yesterday, doubt that this needs to be repeated today.  Will discharge  patient to home. All questions answered. Patient comfortable with plan of discharge. Return precautions discussed with patient and specified on the after visit summary.       Additional history obtained: -External records from outside source obtained and reviewed including: Chart review including previous notes, labs, imaging, consultation notes   Imaging Studies ordered: I ordered imaging studies including DG lumbar spine On my interpretation imaging demonstrates no acute process I independently visualized and interpreted imaging. I agree with the radiologist interpretation   Medicines ordered and prescription drug management: Meds ordered this encounter  Medications   HYDROcodone-acetaminophen (NORCO/VICODIN) 5-325 MG per tablet 1 tablet    -I have reviewed the patients home medicines and have made adjustments as needed  Co morbidities that complicate the patient evaluation  Past Medical History:  Diagnosis Date   Achilles rupture, left    Bipolar 1 disorder (HCC)    Dental caries    periodontitis   Pneumonia    Pulmonary embolism (HCC) 05/18/2007   Sleep apnea    wears CPAP   Subdural hematoma (HCC) 01/04/2013   in setting of supratherapeutic INR      Dispostion: Discharge    Final Clinical Impression(s) / ED Diagnoses Final diagnoses:  Chronic right-sided low back pain with right-sided sciatica     This chart was dictated using voice recognition software.  Despite best efforts to proofread,  errors can occur which can change the documentation meaning.    Lonell Grandchild, MD 12/01/21 1944

## 2021-12-01 NOTE — ED Provider Triage Note (Signed)
Emergency Medicine Provider Triage Evaluation Note  Justin [name] , a 56 y.o. male  was evaluated in triage.  Patient was seen here yesterday.  Pt complains of acute lower back pain with right-sided paraspinal pain and right-sided radicular lower extremity pain.  He is also endorsing some calf tenderness for the last 2 days as well.  He does have a history of blood clots and is currently on Coumadin.  He is concerned that he possibly has a blood clot.  Endorsing some numbness to the right leg as well.  He denies new trauma or injury.  He states that he is seen numerous doctors for his lower back pain and is they "just keep giving him pills" he does not like this.  He states that if something needs to be done surgically he would rather do that.  Review of Systems  Positive:  Negative:   Physical Exam  BP 113/73   Pulse 81   Temp 98.7 F (37.1 C) (Oral)   Resp 20   SpO2 95%  Gen:   Awake, no distress   Resp:  Normal effort  MSK:   Moves extremities without difficulty; positive posterior calf tenderness right-sided, 2+ distal pulses.  No midline tenderness to the lumbar spine.  Positive right-sided paraspinal tenderness. Other:    Medical Decision Making  Medically screening exam initiated at 11:50 AM.  Appropriate orders placed.  [name] was informed that the remainder of the evaluation will be completed by another provider, this initial triage assessment does not replace that evaluation, and the importance of remaining in the ED until their evaluation is complete.     Janell Quiet, [place] 12/01/21 1152

## 2021-12-01 NOTE — ED Notes (Signed)
Went to cafe to get son something to eat will be back

## 2021-12-01 NOTE — ED Triage Notes (Addendum)
Pt arrived from home with low back pain, around SI joint. Pain shoots down right leg. Pt is alert and oriented with NAD.

## 2021-12-01 NOTE — ED Notes (Signed)
Pt returned to lobby.   

## 2021-12-03 ENCOUNTER — Emergency Department (HOSPITAL_COMMUNITY)
Admission: EM | Admit: 2021-12-03 | Discharge: 2021-12-03 | Disposition: A | Discharge status: Home / Self Care | Payer: Medicaid Other | Attending: Emergency Medicine | Admitting: Emergency Medicine

## 2021-12-03 ENCOUNTER — Other Ambulatory Visit: Payer: Self-pay

## 2021-12-03 ENCOUNTER — Encounter (HOSPITAL_COMMUNITY): Payer: Self-pay

## 2021-12-03 DIAGNOSIS — M5441 Lumbago with sciatica, right side: Secondary | ICD-10-CM | POA: Insufficient documentation

## 2021-12-03 DIAGNOSIS — M5442 Lumbago with sciatica, left side: Secondary | ICD-10-CM | POA: Insufficient documentation

## 2021-12-03 DIAGNOSIS — M549 Dorsalgia, unspecified: Secondary | ICD-10-CM | POA: Diagnosis present

## 2021-12-03 DIAGNOSIS — G8929 Other chronic pain: Secondary | ICD-10-CM | POA: Diagnosis not present

## 2021-12-03 DIAGNOSIS — Z7901 Long term (current) use of anticoagulants: Secondary | ICD-10-CM | POA: Diagnosis not present

## 2021-12-03 MED ORDER — CYCLOBENZAPRINE HCL 10 MG PO TABS: 10.0000 mg | ORAL_TABLET | Freq: Three times a day (TID) | ORAL | 0 refills | Status: DC | PRN

## 2021-12-03 MED ORDER — KETOROLAC TROMETHAMINE 15 MG/ML IJ SOLN
15.0000 mg | Freq: Once | INTRAMUSCULAR | Status: AC
  Administered 2021-12-03: 15 mg via INTRAMUSCULAR
  Filled 2021-12-03: qty 1

## 2021-12-03 MED ORDER — LIDOCAINE 5 % EX PTCH: 1.0000 | MEDICATED_PATCH | CUTANEOUS | 0 refills | Status: DC

## 2021-12-03 MED ORDER — LIDOCAINE 5 % EX PTCH
1.0000 | MEDICATED_PATCH | CUTANEOUS | Status: DC
  Administered 2021-12-03: 1 via TRANSDERMAL
  Filled 2021-12-03: qty 1

## 2021-12-03 MED ORDER — GABAPENTIN 300 MG PO CAPS: 300.0000 mg | ORAL_CAPSULE | Freq: Three times a day (TID) | ORAL | 0 refills | Status: DC

## 2021-12-03 MED ORDER — HYDROCODONE-ACETAMINOPHEN 5-325 MG PO TABS: 1.0000 | ORAL_TABLET | Freq: Three times a day (TID) | ORAL | 0 refills | Status: DC | PRN

## 2021-12-03 MED ORDER — METHYLPREDNISOLONE SODIUM SUCC 125 MG IJ SOLR
125.0000 mg | Freq: Once | INTRAMUSCULAR | Status: AC
  Administered 2021-12-03: 125 mg via INTRAMUSCULAR
  Filled 2021-12-03: qty 2

## 2021-12-03 NOTE — ED Provider Notes (Signed)
[facility] EMERGENCY DEPARTMENT Provider Note   CSN: [id number] Arrival date & time: 12/03/21  2018     History  Chief Complaint  Patient presents with   Back Pain    Justin [name] is a 56 y.o. male with a past medical history of chronic back pain presenting today with back pain.  He reports being here couple days ago and that since then he continues to have pain.  Says that he has pain that radiates all the way down his right leg.  Feels like he cannot take the pain and it is causing him to be unsteady on his feet.  No bowel/bladder dysfunction, saddle anesthesia, fevers, chills or IVDU.  Says that he is supposed to have surgery but "nobody wants to do it."   Back Pain      Home Medications Prior to Admission medications   Medication Sig Start Date End Date Taking? Authorizing Provider  acetaminophen (TYLENOL) 500 MG tablet Take 1 tablet (500 mg total) by mouth every 6 (six) hours as needed. 08/28/19   Fawze, Mina A, PA-C  albuterol (VENTOLIN HFA) 108 (90 Base) MCG/ACT inhaler Inhale 1-2 puffs into the lungs every 6 (six) hours as needed for wheezing or shortness of breath. 03/21/21   Hazel Sams, PA-C  azithromycin (ZITHROMAX) 250 MG tablet Take 1 tablet (250 mg total) by mouth daily. Take first 2 tablets together, then 1 every day until finished. 03/19/21   Rayna Sexton, PA-C  cyclobenzaprine (FLEXERIL) 10 MG tablet Take 1 tablet (10 mg total) by mouth 3 (three) times daily as needed for muscle spasms. 09/12/20   Hayden Rasmussen, MD  doxepin (SINEQUAN) 75 MG capsule Take 75 mg by mouth at bedtime. 12/14/19   [provider]  doxycycline (VIBRAMYCIN) 100 MG capsule Take 1 capsule (100 mg total) by mouth 2 (two) times daily. One po bid x 7 days 11/04/21   Ripley Fraise, MD  enoxaparin (LOVENOX) 100 MG/ML injection Inject 1 mL (100 mg total) into the skin every 12 (twelve) hours. 09/23/21   Davonna Belling, MD  hydrOXYzine (ATARAX/VISTARIL) 25 MG  tablet Take 25 mg by mouth at bedtime. 08/22/19   [provider]  lithium 600 MG capsule Take 600 mg by mouth at bedtime.    [provider]  lithium carbonate (ESKALITH) 450 MG CR tablet Take 450 mg by mouth daily. 12/14/19   [provider]  mirtazapine (REMERON) 45 MG tablet Take 45 mg by mouth at bedtime.    [provider]  predniSONE (DELTASONE) 20 MG tablet Take 2 tablets (40 mg total) by mouth daily. 09/23/21   Davonna Belling, MD  tiZANidine (ZANAFLEX) 4 MG tablet Take 4 mg by mouth at bedtime.  12/17/19   [provider]  warfarin (COUMADIN) 5 MG tablet Take 1 tablet (5 mg total) by mouth daily. 10/19/21   Lincoln Brigham, PA-C      Allergies    Lyrica [pregabalin]    Review of Systems   Review of Systems  Musculoskeletal:  Positive for back pain.    Physical Exam Updated Vital Signs BP (!) 145/134   Pulse (!) 57   Temp 99.6 F (37.6 C) (Oral)   Resp 18   Ht 5\' 10"  (1.778 m)   Wt 104.6 kg   SpO2 93%   BMI 33.09 kg/m  Physical Exam Vitals and nursing note reviewed.  Constitutional:      Appearance: Normal appearance.  HENT:  Head: Normocephalic and atraumatic.  Eyes:     General: No scleral icterus.    Conjunctiva/sclera: Conjunctivae normal.  Pulmonary:     Effort: Pulmonary effort is normal. No respiratory distress.  Musculoskeletal:        General: Tenderness present. No deformity. Normal range of motion.       Legs:     Comments: Tenderness in patient's anterior right thigh.  Full range of motion of bilateral lower extremities.  Normal strength to ankle plantarflexion bilaterally.  No sensation deficits, DP pulses symmetric and equal bilaterally  Skin:    Findings: No rash.  Neurological:     Mental Status: He is alert.  Psychiatric:        Mood and Affect: Mood normal.     ED Results / Procedures / Treatments   Labs (all labs ordered are listed, but only abnormal results are displayed) Labs Reviewed - No  data to display  EKG None  Radiology No results found.  Procedures Procedures   Medications Ordered in ED Medications  lidocaine (LIDODERM) 5 % 1 patch (1 patch Transdermal Patch Applied 12/03/21 2141)  ketorolac (TORADOL) 15 MG/ML injection 15 mg (15 mg Intramuscular Given 12/03/21 2138)  methylPREDNISolone sodium succinate (SOLU-MEDROL) 125 mg/2 mL injection 125 mg (125 mg Intramuscular Given 12/03/21 2138)    ED Course/ Medical Decision Making/ A&P                           Medical Decision Making Risk Prescription drug management.   This is a 56 year old male with known lumbar spine back pain who presents with back pain.  Differential includes but is not limited to acute on chronic cord compression, epidural abscess, nerve impingement and lumbar strain.     This is not an exhaustive differential.    Past Medical History / Co-morbidities / Social History: History of lumbar spondylolisthesis and lumbar stenosis.  Had a lumbar spinal fusion in the past as well.  Follows with Dr. Shon Baton with Ortho   Additional history: Per chart review, patient was seen for this 2 days ago.  He had a normal x-ray at that time and showed no signs of cord compression or spinal infection.  He also recently had a DVT study due to his complaints of pain down his right leg.  This was negative for DVT   Physical Exam: Pertinent physical exam findings include Tenderness to palpation of the right anterior thigh Full range of motion of bilateral ankles.  Normal strength testing with ankle plantarflexion  Lab Tests: No labs indicated   Imaging Studies: Considered x-ray however patient had 1 done 2 days ago    Medications: I ordered lidocaine patch, Toradol and steroid injections.  He was informed I would not give him any narcotic pain medication because he drove to the department today.  MDM/Disposition: This is a 56 year old male presenting today with back pain.  He has chronic back pain and  was seen a couple days ago with a negative work-up.  My exam does not appear to have changed from the previous providers.  No signs of cord compression.  Patient ultimately needs to be seen by outpatient physicians about his chronic back pain.  I will send him 5 pills worth of hydrocodone to his pharmacy as well as a refill on his muscle relaxants, gabapentin, lidocaine patches and Flexeril.  He is agreeable.  Final Clinical Impression(s) / ED Diagnoses Final diagnoses:  Chronic right-sided low back  pain with bilateral sciatica    Rx / DC Orders ED Discharge Orders          Ordered    gabapentin (NEURONTIN) 300 MG capsule  3 times daily        12/03/21 2144    lidocaine (LIDODERM) 5 %  Every 24 hours        12/03/21 2144    cyclobenzaprine (FLEXERIL) 10 MG tablet  3 times daily PRN        12/03/21 2144    HYDROcodone-acetaminophen (NORCO/VICODIN) 5-325 MG tablet  3 times daily PRN        12/03/21 2144           Results and diagnoses were explained to the patient. Return precautions discussed in full. Patient had no additional questions and expressed complete understanding.   This chart was dictated using voice recognition software.  Despite best efforts to proofread,  errors can occur which can change the documentation meaning.    Woodroe Chen 12/03/21 2227    Derwood Kaplan, MD 12/04/21 1515

## 2021-12-03 NOTE — Discharge Instructions (Addendum)
You will need to follow-up with outpatient medical providers for your back pain.  The emergency department does not routinely treat chronic pain.  Today you were given a steroid shot as well as a Toradol shot.  Both of these help with inflammation that may be compressing your nerves.  Your x-ray showed some disc degeneration in your lower back and this is likely causing some impingement on your sciatic nerve resulting in pain all the way to your feet.  For definitive treatment you will need to follow-up with spine or neurosurgery. There are two offices attached to your discharge papers. You may alternate Tylenol for your pain.  I did send a couple of Vicodin pills to your pharmacy for you to pick up when you can afford it.  I have also refilled the Flexeril muscle relaxant.  Both the Vicodin and Flexeril can make you drowsy so do not take them while you are driving or caring for others.  Return with any severely worsening symptoms, otherwise please see your outpatient providers.

## 2021-12-03 NOTE — ED Notes (Signed)
Patient drops phone on the floor and bends over the side of the wheelchair to pick up the phone without any difficulty and able to stand up and walk a few steps from the wheelchair to the bed

## 2021-12-03 NOTE — ED Triage Notes (Signed)
Pt was here for back pain that goes down leg. Now pt is having back pain radiating down both legs and swelling to right leg. Pt tried to walk but lost balance due to the pain.

## 2021-12-04 NOTE — ED Provider Notes (Signed)
Received call pharmacy.  Asked about getting 5 pills of the hydrocodone.  Reviewed note from provider.  She had reviewed the database and she should be aware of the previous prescription.  Will fill the 5 pills.   Davonna Belling, MD 12/04/21 1242

## 2021-12-05 ENCOUNTER — Other Ambulatory Visit: Payer: Self-pay | Admitting: Internal Medicine

## 2021-12-06 LAB — PROTIME-INR
INR: 10.5
Prothrombin Time: 95.4 s — ABNORMAL HIGH (ref 9.0–11.5)

## 2021-12-08 ENCOUNTER — Emergency Department (HOSPITAL_COMMUNITY): Payer: Medicaid Other

## 2021-12-08 ENCOUNTER — Inpatient Hospital Stay (HOSPITAL_COMMUNITY)
Admission: EM | Admit: 2021-12-08 | Discharge: 2021-12-10 | DRG: 556 | Disposition: A | Discharge status: Home / Self Care | Payer: Medicaid Other | Attending: Internal Medicine | Admitting: Internal Medicine

## 2021-12-08 ENCOUNTER — Other Ambulatory Visit: Payer: Self-pay | Admitting: Internal Medicine

## 2021-12-08 ENCOUNTER — Other Ambulatory Visit: Payer: Self-pay

## 2021-12-08 ENCOUNTER — Encounter (HOSPITAL_COMMUNITY): Payer: Self-pay | Admitting: Emergency Medicine

## 2021-12-08 DIAGNOSIS — Z86718 Personal history of other venous thrombosis and embolism: Secondary | ICD-10-CM

## 2021-12-08 DIAGNOSIS — Z8249 Family history of ischemic heart disease and other diseases of the circulatory system: Secondary | ICD-10-CM

## 2021-12-08 DIAGNOSIS — I10 Essential (primary) hypertension: Secondary | ICD-10-CM | POA: Diagnosis present

## 2021-12-08 DIAGNOSIS — Z8279 Family history of other congenital malformations, deformations and chromosomal abnormalities: Secondary | ICD-10-CM

## 2021-12-08 DIAGNOSIS — D72829 Elevated white blood cell count, unspecified: Secondary | ICD-10-CM | POA: Diagnosis present

## 2021-12-08 DIAGNOSIS — Z888 Allergy status to other drugs, medicaments and biological substances status: Secondary | ICD-10-CM

## 2021-12-08 DIAGNOSIS — Z823 Family history of stroke: Secondary | ICD-10-CM

## 2021-12-08 DIAGNOSIS — M545 Low back pain, unspecified: Secondary | ICD-10-CM | POA: Diagnosis present

## 2021-12-08 DIAGNOSIS — G8929 Other chronic pain: Secondary | ICD-10-CM | POA: Diagnosis present

## 2021-12-08 DIAGNOSIS — G4733 Obstructive sleep apnea (adult) (pediatric): Secondary | ICD-10-CM | POA: Diagnosis present

## 2021-12-08 DIAGNOSIS — T148XXA Other injury of unspecified body region, initial encounter: Secondary | ICD-10-CM | POA: Diagnosis present

## 2021-12-08 DIAGNOSIS — D62 Acute posthemorrhagic anemia: Secondary | ICD-10-CM | POA: Diagnosis present

## 2021-12-08 DIAGNOSIS — Z95828 Presence of other vascular implants and grafts: Secondary | ICD-10-CM

## 2021-12-08 DIAGNOSIS — I248 Other forms of acute ischemic heart disease: Secondary | ICD-10-CM | POA: Diagnosis present

## 2021-12-08 DIAGNOSIS — Z86711 Personal history of pulmonary embolism: Secondary | ICD-10-CM

## 2021-12-08 DIAGNOSIS — R262 Difficulty in walking, not elsewhere classified: Secondary | ICD-10-CM | POA: Diagnosis present

## 2021-12-08 DIAGNOSIS — Z820 Family history of epilepsy and other diseases of the nervous system: Secondary | ICD-10-CM

## 2021-12-08 DIAGNOSIS — Z79899 Other long term (current) drug therapy: Secondary | ICD-10-CM

## 2021-12-08 DIAGNOSIS — M7981 Nontraumatic hematoma of soft tissue: Principal | ICD-10-CM | POA: Diagnosis present

## 2021-12-08 DIAGNOSIS — Z9682 Presence of neurostimulator: Secondary | ICD-10-CM

## 2021-12-08 DIAGNOSIS — F319 Bipolar disorder, unspecified: Secondary | ICD-10-CM | POA: Diagnosis present

## 2021-12-08 DIAGNOSIS — D6832 Hemorrhagic disorder due to extrinsic circulating anticoagulants: Secondary | ICD-10-CM | POA: Diagnosis present

## 2021-12-08 DIAGNOSIS — Z7901 Long term (current) use of anticoagulants: Secondary | ICD-10-CM

## 2021-12-08 DIAGNOSIS — T45515A Adverse effect of anticoagulants, initial encounter: Secondary | ICD-10-CM | POA: Diagnosis present

## 2021-12-08 LAB — CBC WITH DIFFERENTIAL/PLATELET
Abs Immature Granulocytes: 0.1 10*3/uL — ABNORMAL HIGH (ref 0.00–0.07)
Basophils Absolute: 0.1 10*3/uL (ref 0.0–0.1)
Basophils Relative: 1 %
Eosinophils Absolute: 0.2 10*3/uL (ref 0.0–0.5)
Eosinophils Relative: 1 %
HCT: 31.2 % — ABNORMAL LOW (ref 39.0–52.0)
Hemoglobin: 10.3 g/dL — ABNORMAL LOW (ref 13.0–17.0)
Immature Granulocytes: 1 %
Lymphocytes Relative: 18 %
Lymphs Abs: 2.5 10*3/uL (ref 0.7–4.0)
MCH: 28.3 pg (ref 26.0–34.0)
MCHC: 33 g/dL (ref 30.0–36.0)
MCV: 85.7 fL (ref 80.0–100.0)
Monocytes Absolute: 1.1 10*3/uL — ABNORMAL HIGH (ref 0.1–1.0)
Monocytes Relative: 8 %
Neutro Abs: 10 10*3/uL — ABNORMAL HIGH (ref 1.7–7.7)
Neutrophils Relative %: 71 %
Platelets: 291 10*3/uL (ref 150–400)
RBC: 3.64 MIL/uL — ABNORMAL LOW (ref 4.22–5.81)
RDW: 14.7 % (ref 11.5–15.5)
WBC: 14 10*3/uL — ABNORMAL HIGH (ref 4.0–10.5)
nRBC: 0.1 % (ref 0.0–0.2)

## 2021-12-08 LAB — TROPONIN I (HIGH SENSITIVITY)
Troponin I (High Sensitivity): 28 ng/L — ABNORMAL HIGH (ref <18)
Troponin I (High Sensitivity): 31 ng/L — ABNORMAL HIGH (ref <18)

## 2021-12-08 LAB — D-DIMER, QUANTITATIVE: D-Dimer, Quant: <0.27 ug/mL-FEU (ref 0.00–0.50)

## 2021-12-08 LAB — BASIC METABOLIC PANEL
Anion gap: 10 (ref 5–15)
BUN: 7 mg/dL (ref 6–20)
CO2: 25 mmol/L (ref 22–32)
Calcium: 8.6 mg/dL — ABNORMAL LOW (ref 8.9–10.3)
Chloride: 103 mmol/L (ref 98–111)
Creatinine, Ser: 1.41 mg/dL — ABNORMAL HIGH (ref 0.61–1.24)
GFR, Estimated: 58 mL/min — ABNORMAL LOW (ref >60)
Glucose, Bld: 136 mg/dL — ABNORMAL HIGH (ref 70–99)
Potassium: 3.9 mmol/L (ref 3.5–5.1)
Sodium: 138 mmol/L (ref 135–145)

## 2021-12-08 LAB — PROTIME-INR
INR: 8.4
Prothrombin Time: 77.1 s — ABNORMAL HIGH (ref 9.0–11.5)

## 2021-12-08 LAB — BRAIN NATRIURETIC PEPTIDE: B Natriuretic Peptide: 23.4 pg/mL (ref 0.0–100.0)

## 2021-12-08 NOTE — ED Provider Triage Note (Cosign Needed Addendum)
Emergency Medicine Provider Triage Evaluation Note  Justin [name] , a 56 y.o. male  was evaluated in triage.  Pt complains of left thigh pain onset 3 days. Patient has a IVC filter in place due to a PE.  He is on Coumadin and is compliant with his medications.  Denies recent injury, trauma, fall.  Notes that he has had increased swelling to the left thigh as well as pain to the popliteal region.  Has mild trouble breathing.  Has chest pain.  Review of Systems  Positive:  Negative:   Physical Exam  BP (!) 146/99 (BP Location: Right Arm)   Pulse (!) 109   Temp 100.2 F (37.9 C) (Oral)   Resp 15   SpO2 98%  Gen:   Awake, no distress   Resp:  Normal effort  MSK:   Moves extremities without difficulty  Other:  Tenderness to palpation noted to left popliteal region, left anterior knee, left anterior/lateral thigh without overlying skin changes.  Mild swelling noted to the area.  Pedal pulses intact.  Normal flexion extension of left knee.  Medical Decision Making  Medically screening exam initiated at 7:54 PM.  Appropriate orders placed.  [name] was informed that the remainder of the evaluation will be completed by another provider, this initial triage assessment does not replace that evaluation, and the importance of remaining in the ED until their evaluation is complete.  Work-up initiated   Kasee Hantz A, PA-C 12/08/21 2001    Lenya Sterne A, PA-C 12/08/21 2003

## 2021-12-08 NOTE — ED Triage Notes (Signed)
Patient reports left thigh pain with mild swelling onset last week , denies injury . Respirations unlabored .

## 2021-12-09 ENCOUNTER — Emergency Department (HOSPITAL_COMMUNITY): Payer: Medicaid Other

## 2021-12-09 DIAGNOSIS — Z86711 Personal history of pulmonary embolism: Secondary | ICD-10-CM | POA: Diagnosis not present

## 2021-12-09 DIAGNOSIS — T148XXA Other injury of unspecified body region, initial encounter: Secondary | ICD-10-CM | POA: Diagnosis not present

## 2021-12-09 DIAGNOSIS — Z95828 Presence of other vascular implants and grafts: Secondary | ICD-10-CM | POA: Diagnosis not present

## 2021-12-09 DIAGNOSIS — G8929 Other chronic pain: Secondary | ICD-10-CM | POA: Diagnosis present

## 2021-12-09 DIAGNOSIS — F319 Bipolar disorder, unspecified: Secondary | ICD-10-CM | POA: Diagnosis present

## 2021-12-09 DIAGNOSIS — D72829 Elevated white blood cell count, unspecified: Secondary | ICD-10-CM | POA: Diagnosis present

## 2021-12-09 DIAGNOSIS — Z8249 Family history of ischemic heart disease and other diseases of the circulatory system: Secondary | ICD-10-CM | POA: Diagnosis not present

## 2021-12-09 DIAGNOSIS — I10 Essential (primary) hypertension: Secondary | ICD-10-CM | POA: Diagnosis present

## 2021-12-09 DIAGNOSIS — R262 Difficulty in walking, not elsewhere classified: Secondary | ICD-10-CM | POA: Diagnosis present

## 2021-12-09 DIAGNOSIS — Z820 Family history of epilepsy and other diseases of the nervous system: Secondary | ICD-10-CM | POA: Diagnosis not present

## 2021-12-09 DIAGNOSIS — D6832 Hemorrhagic disorder due to extrinsic circulating anticoagulants: Secondary | ICD-10-CM | POA: Diagnosis present

## 2021-12-09 DIAGNOSIS — Z823 Family history of stroke: Secondary | ICD-10-CM | POA: Diagnosis not present

## 2021-12-09 DIAGNOSIS — M545 Low back pain, unspecified: Secondary | ICD-10-CM | POA: Diagnosis present

## 2021-12-09 DIAGNOSIS — G4733 Obstructive sleep apnea (adult) (pediatric): Secondary | ICD-10-CM | POA: Diagnosis present

## 2021-12-09 DIAGNOSIS — T45515A Adverse effect of anticoagulants, initial encounter: Secondary | ICD-10-CM | POA: Diagnosis present

## 2021-12-09 DIAGNOSIS — I248 Other forms of acute ischemic heart disease: Secondary | ICD-10-CM | POA: Diagnosis present

## 2021-12-09 DIAGNOSIS — Z79899 Other long term (current) drug therapy: Secondary | ICD-10-CM | POA: Diagnosis not present

## 2021-12-09 DIAGNOSIS — Z7901 Long term (current) use of anticoagulants: Secondary | ICD-10-CM | POA: Diagnosis not present

## 2021-12-09 DIAGNOSIS — M7981 Nontraumatic hematoma of soft tissue: Secondary | ICD-10-CM | POA: Diagnosis present

## 2021-12-09 DIAGNOSIS — Z8279 Family history of other congenital malformations, deformations and chromosomal abnormalities: Secondary | ICD-10-CM | POA: Diagnosis not present

## 2021-12-09 DIAGNOSIS — M79652 Pain in left thigh: Secondary | ICD-10-CM | POA: Diagnosis not present

## 2021-12-09 DIAGNOSIS — Z9682 Presence of neurostimulator: Secondary | ICD-10-CM | POA: Diagnosis not present

## 2021-12-09 DIAGNOSIS — Z86718 Personal history of other venous thrombosis and embolism: Secondary | ICD-10-CM | POA: Diagnosis not present

## 2021-12-09 DIAGNOSIS — D62 Acute posthemorrhagic anemia: Secondary | ICD-10-CM | POA: Diagnosis present

## 2021-12-09 DIAGNOSIS — Z888 Allergy status to other drugs, medicaments and biological substances status: Secondary | ICD-10-CM | POA: Diagnosis not present

## 2021-12-09 LAB — COMPREHENSIVE METABOLIC PANEL
ALT: 34 U/L (ref 0–44)
AST: 37 U/L (ref 15–41)
Albumin: 3.6 g/dL (ref 3.5–5.0)
Alkaline Phosphatase: 77 U/L (ref 38–126)
Anion gap: 11 (ref 5–15)
BUN: 7 mg/dL (ref 6–20)
CO2: 23 mmol/L (ref 22–32)
Calcium: 8.6 mg/dL — ABNORMAL LOW (ref 8.9–10.3)
Chloride: 104 mmol/L (ref 98–111)
Creatinine, Ser: 1.29 mg/dL — ABNORMAL HIGH (ref 0.61–1.24)
GFR, Estimated: >60 mL/min (ref >60)
Glucose, Bld: 145 mg/dL — ABNORMAL HIGH (ref 70–99)
Potassium: 3.7 mmol/L (ref 3.5–5.1)
Sodium: 138 mmol/L (ref 135–145)
Total Bilirubin: 0.6 mg/dL (ref 0.3–1.2)
Total Protein: 6.5 g/dL (ref 6.5–8.1)

## 2021-12-09 LAB — CBC WITH DIFFERENTIAL/PLATELET
Abs Immature Granulocytes: 0.1 10*3/uL — ABNORMAL HIGH (ref 0.00–0.07)
Basophils Absolute: 0.1 10*3/uL (ref 0.0–0.1)
Basophils Relative: 1 %
Eosinophils Absolute: 0.3 10*3/uL (ref 0.0–0.5)
Eosinophils Relative: 2 %
HCT: 30 % — ABNORMAL LOW (ref 39.0–52.0)
Hemoglobin: 9.9 g/dL — ABNORMAL LOW (ref 13.0–17.0)
Immature Granulocytes: 1 %
Lymphocytes Relative: 20 %
Lymphs Abs: 2.6 10*3/uL (ref 0.7–4.0)
MCH: 28.6 pg (ref 26.0–34.0)
MCHC: 33 g/dL (ref 30.0–36.0)
MCV: 86.7 fL (ref 80.0–100.0)
Monocytes Absolute: 1 10*3/uL (ref 0.1–1.0)
Monocytes Relative: 8 %
Neutro Abs: 8.8 10*3/uL — ABNORMAL HIGH (ref 1.7–7.7)
Neutrophils Relative %: 68 %
Platelets: 290 10*3/uL (ref 150–400)
RBC: 3.46 MIL/uL — ABNORMAL LOW (ref 4.22–5.81)
RDW: 14.9 % (ref 11.5–15.5)
WBC: 12.9 10*3/uL — ABNORMAL HIGH (ref 4.0–10.5)
nRBC: 0 % (ref 0.0–0.2)

## 2021-12-09 LAB — HIV ANTIBODY (ROUTINE TESTING W REFLEX): HIV Screen 4th Generation wRfx: NONREACTIVE

## 2021-12-09 LAB — PHOSPHORUS: Phosphorus: 3.2 mg/dL (ref 2.5–4.6)

## 2021-12-09 LAB — PROTIME-INR
INR: 7.1 (ref 0.8–1.2)
INR: 5.9 (ref 0.8–1.2)
Prothrombin Time: 60.5 seconds — ABNORMAL HIGH (ref 11.4–15.2)
Prothrombin Time: 52.6 seconds — ABNORMAL HIGH (ref 11.4–15.2)

## 2021-12-09 LAB — MAGNESIUM: Magnesium: 2.4 mg/dL (ref 1.7–2.4)

## 2021-12-09 MED ORDER — MIRTAZAPINE 15 MG PO TABS
45.0000 mg | ORAL_TABLET | Freq: Every day | ORAL | Status: DC
  Administered 2021-12-09: 45 mg via ORAL
  Filled 2021-12-09: qty 3

## 2021-12-09 MED ORDER — OXYCODONE HCL 5 MG PO TABS
5.0000 mg | ORAL_TABLET | Freq: Four times a day (QID) | ORAL | Status: DC | PRN
  Administered 2021-12-09 (×2): 5 mg via ORAL
  Filled 2021-12-09 (×2): qty 1

## 2021-12-09 MED ORDER — MELATONIN 5 MG PO TABS
5.0000 mg | ORAL_TABLET | Freq: Every evening | ORAL | Status: DC | PRN
  Administered 2021-12-09: 5 mg via ORAL
  Filled 2021-12-09: qty 1

## 2021-12-09 MED ORDER — IOHEXOL 350 MG/ML SOLN
100.0000 mL | Freq: Once | INTRAVENOUS | Status: AC | PRN
  Administered 2021-12-09: 100 mL via INTRAVENOUS

## 2021-12-09 MED ORDER — TIZANIDINE HCL 4 MG PO TABS
4.0000 mg | ORAL_TABLET | Freq: Every day | ORAL | Status: DC
  Administered 2021-12-09: 4 mg via ORAL
  Filled 2021-12-09: qty 1

## 2021-12-09 MED ORDER — QUETIAPINE FUMARATE ER 200 MG PO TB24
800.0000 mg | ORAL_TABLET | Freq: Every day | ORAL | Status: DC
  Administered 2021-12-09: 800 mg via ORAL
  Filled 2021-12-09 (×2): qty 4
  Filled 2021-12-09: qty 2

## 2021-12-09 MED ORDER — HYDROMORPHONE HCL 1 MG/ML IJ SOLN
0.5000 mg | INTRAMUSCULAR | Status: DC | PRN
  Administered 2021-12-09: 0.5 mg via INTRAVENOUS
  Filled 2021-12-09: qty 1

## 2021-12-09 MED ORDER — ACETAMINOPHEN 325 MG PO TABS: 650.0000 mg | ORAL_TABLET | Freq: Four times a day (QID) | ORAL | Status: DC | PRN

## 2021-12-09 MED ORDER — ALBUTEROL SULFATE (2.5 MG/3ML) 0.083% IN NEBU: 3.0000 mL | INHALATION_SOLUTION | Freq: Four times a day (QID) | RESPIRATORY_TRACT | Status: DC | PRN

## 2021-12-09 MED ORDER — POLYETHYLENE GLYCOL 3350 17 G PO PACK: 17.0000 g | PACK | Freq: Every day | ORAL | Status: DC | PRN

## 2021-12-09 MED ORDER — SENNOSIDES-DOCUSATE SODIUM 8.6-50 MG PO TABS
1.0000 | ORAL_TABLET | Freq: Every day | ORAL | Status: DC
  Filled 2021-12-09: qty 1

## 2021-12-09 MED ORDER — LITHIUM CARBONATE ER 450 MG PO TBCR
450.0000 mg | EXTENDED_RELEASE_TABLET | Freq: Every day | ORAL | Status: DC
  Administered 2021-12-09 – 2021-12-10 (×2): 450 mg via ORAL
  Filled 2021-12-09 (×2): qty 1

## 2021-12-09 MED ORDER — LITHIUM CARBONATE 300 MG PO CAPS
600.0000 mg | ORAL_CAPSULE | Freq: Every day | ORAL | Status: DC
  Administered 2021-12-09: 600 mg via ORAL
  Filled 2021-12-09 (×2): qty 2

## 2021-12-09 NOTE — H&P (Addendum)
History and Physical  Justin [name] [id number] DOB: 01-24-1966 DOA: 12/08/2021  Referring physician: Dr. Betsey Holiday, [place]  PCP: [place], [place]  Outpatient Specialists: Hematology/oncology Patient coming from: Home.  Chief Complaint: Left thigh pain  HPI: Justin [name] is a 56 y.o. male with medical history significant for chronic low back pain, followed by spinal surgery, status post stimulator placement, subdural hematoma in the setting of supratherapeutic INR, recurrent thromboembolism, pulmonary embolism, DVT, initially on Coumadin, then switched to Eliquis with IVC filter, then switched to Lovenox injection then Coumadin, OSA on CPAP, who presented to [facility] ED with complaints of left thigh pain and swelling of 2 days duration.  Stopped taking Coumadin 2 days ago as instructed by his PCP due to supra therapeutic INR on Coumadin.  Denies trauma.    To note, the patient endorses being on Eliquis for about 3 months.  He was taking Eliquis every night and did not realize that he was missing his AM dose.  He thought the DOAC was prescribed to take once a day.  He was switched to Coumadin under the impression that he was compliant with Eliquis.  Work-up in the ED revealed large spontaneous hematoma in left thigh, seen on CT scan, supratherapeutic INR. 7.1.  Acute blood loss anemia with hemoglobin down to 10.3 from 13.1, 1 month ago.  Vital signs stable.  EDP requesting admission for further management.  The patient was admitted by the hospitalist service, [facility].  ED Course: Tmax 100.2.  BP 138/99, pulse 98, respiration rate 16, O2 saturation 99% on room air.  Labs today significant for WBC 14.0, hemoglobin 10.3, platelet 291, creatinine 1.41 with GFR of 58.  High-sensitivity troponin 31 from 28.   Review of Systems: Review of systems as noted in the HPI. All other systems reviewed and are negative.   Past Medical History:  Diagnosis Date   Achilles rupture, left    Bipolar 1 disorder (HCC)     Dental caries    periodontitis   Pneumonia    Pulmonary embolism ([place]) 05/18/2007   Sleep apnea    wears CPAP   Subdural hematoma ([place]) 01/04/2013   in setting of supratherapeutic INR   Past Surgical History:  Procedure Laterality Date   ACHILLES TENDON SURGERY Left 10/21/2014   Procedure: Left Achilles Reconstruction;  Surgeon: Newt Minion, MD;  Location: [place];  Service: Orthopedics;  Laterality: Left;   APPENDECTOMY     CARDIAC CATHETERIZATION  03/04/2018   [facility]: Normal coronaries, LVEF estimated at 40%, medical Rx   CRANIOTOMY N/A 01/19/2013   Procedure: CRANIOTOMY HEMATOMA EVACUATION SUBDURAL;  Surgeon: Hosie Spangle, MD;  Location: [place] [facility];  Service: Neurosurgery;  Laterality: N/A;   CYSTECTOMY     right head   ELBOW SURGERY     right   FRACTURE SURGERY     finger   I & D EXTREMITY Left 12/07/2016   Procedure: LEFT ACHILLES DEBRIDEMENT;  Surgeon: Newt Minion, MD;  Location: [place];  Service: Orthopedics;  Laterality: Left;   LUMBAR FUSION  12/23/2017   L5 GILL PROCEDURE, RIGHT L5-S1, TRANSFORAMIAL LUMBAR INTERBODY FUSION, BILATERAL LATERAL FUSION, PEDICLE INSTRUMENTATION   MULTIPLE EXTRACTIONS WITH ALVEOLOPLASTY N/A 03/07/2015   Procedure: MULTIPLE EXTRACTION WITH ALVEOLOPLASTY;  Surgeon: Diona Browner, DDS;  Location: [place];  Service: Oral Surgery;  Laterality: N/A;   SPINAL CORD STIMULATOR INSERTION N/A 05/18/2019   Procedure: LUMBAR SPINAL CORD STIMULATOR INSERTION;  Surgeon: Clydell Hakim, MD;  Location: [place];  Service: Neurosurgery;  Laterality: N/A;  Thoracic/Lumbar   SPINAL CORD STIMULATOR INSERTION N/A 04/06/2020   Procedure: Revision of spinal cord stimulator;  Surgeon: Reece Agar, MD;  Location: [place];  Service: Neurosurgery;  Laterality: N/A;    Social History:  reports that he has never smoked. He has never used smokeless tobacco. He reports that he does not drink alcohol and does not use drugs.   Allergies  Allergen Reactions    Lyrica [Pregabalin] Other (See Comments)    Hallucinations    Family History  Problem Relation Age of Onset   Hypertension Mother    Stroke Mother    Multiple sclerosis Sister    Down syndrome Son       Prior to Admission medications   Medication Sig Start Date End Date Taking? Authorizing Provider  acetaminophen (TYLENOL) 500 MG tablet Take 1 tablet (500 mg total) by mouth every 6 (six) hours as needed. 08/28/19   Fawze, Mina A, PA-C  albuterol (VENTOLIN HFA) 108 (90 Base) MCG/ACT inhaler Inhale 1-2 puffs into the lungs every 6 (six) hours as needed for wheezing or shortness of breath. 03/21/21   Hazel Sams, PA-C  azithromycin (ZITHROMAX) 250 MG tablet Take 1 tablet (250 mg total) by mouth daily. Take first 2 tablets together, then 1 every day until finished. 03/19/21   Rayna Sexton, PA-C  cyclobenzaprine (FLEXERIL) 10 MG tablet Take 1 tablet (10 mg total) by mouth 3 (three) times daily as needed for muscle spasms. 12/03/21   Redwine, Madison A, PA-C  doxepin (SINEQUAN) 75 MG capsule Take 75 mg by mouth at bedtime. 12/14/19   [provider]  doxycycline (VIBRAMYCIN) 100 MG capsule Take 1 capsule (100 mg total) by mouth 2 (two) times daily. One po bid x 7 days 11/04/21   Ripley Fraise, MD  enoxaparin (LOVENOX) 100 MG/ML injection Inject 1 mL (100 mg total) into the skin every 12 (twelve) hours. 09/23/21   Davonna Belling, MD  gabapentin (NEURONTIN) 300 MG capsule Take 1 capsule (300 mg total) by mouth 3 (three) times daily for 14 days. 12/03/21 12/17/21  Redwine, Madison A, PA-C  HYDROcodone-acetaminophen (NORCO/VICODIN) 5-325 MG tablet Take 1 tablet by mouth 3 (three) times daily as needed. 12/03/21   Redwine, Madison A, PA-C  hydrOXYzine (ATARAX/VISTARIL) 25 MG tablet Take 25 mg by mouth at bedtime. 08/22/19   [provider]  lidocaine (LIDODERM) 5 % Place 1 patch onto the skin daily. Remove & Discard patch within 12 hours or as directed by MD 12/03/21   Redwine, Madison  A, PA-C  lithium 600 MG capsule Take 600 mg by mouth at bedtime.    [provider]  lithium carbonate (ESKALITH) 450 MG CR tablet Take 450 mg by mouth daily. 12/14/19   [provider]  mirtazapine (REMERON) 45 MG tablet Take 45 mg by mouth at bedtime.    [provider]  predniSONE (DELTASONE) 20 MG tablet Take 2 tablets (40 mg total) by mouth daily. 09/23/21   Davonna Belling, MD  tiZANidine (ZANAFLEX) 4 MG tablet Take 4 mg by mouth at bedtime.  12/17/19   [provider]  warfarin (COUMADIN) 5 MG tablet Take 1 tablet (5 mg total) by mouth daily. 10/19/21   Lincoln Brigham, PA-C    Physical Exam: BP (!) 138/99 (BP Location: Left Arm)   Pulse 98   Temp 98.3 F (36.8 C) (Oral)   Resp 16   SpO2 99%   General: 56 y.o. year-old male  well developed well nourished in no acute distress.  Alert and oriented x3. Cardiovascular: Regular rate and rhythm with no rubs or gallops.  No thyromegaly or JVD noted.  No lower extremity edema. 2/4 pulses in all 4 extremities. Respiratory: Clear to auscultation with no wheezes or rales. Good inspiratory effort. Abdomen: Soft nontender nondistended with normal bowel sounds x4 quadrants. Muskuloskeletal: R thigh edematous and painful with palpation. Neuro: CN II-XII intact, strength, sensation, reflexes Skin: No ulcerative lesions noted or rashes Psychiatry: Judgement and insight appear normal. Mood is appropriate for condition and setting          Labs on Admission:  Basic Metabolic Panel: Recent Labs  Lab 12/08/21 2007  NA 138  K 3.9  CL 103  CO2 25  GLUCOSE 136*  BUN 7  CREATININE 1.41*  CALCIUM 8.6*   Liver Function Tests: No results for input(s): "AST", "ALT", "ALKPHOS", "BILITOT", "PROT", "ALBUMIN" in the last 168 hours. No results for input(s): "LIPASE", "AMYLASE" in the last 168 hours. No results for input(s): "AMMONIA" in the last 168 hours. CBC: Recent Labs  Lab 12/08/21 2007  WBC 14.0*   NEUTROABS 10.0*  HGB 10.3*  HCT 31.2*  MCV 85.7  PLT 291   Cardiac Enzymes: No results for input(s): "CKTOTAL", "CKMB", "CKMBINDEX", "TROPONINI" in the last 168 hours.  BNP (last 3 results) Recent Labs    03/19/21 1028 12/08/21 2007  BNP 59.0 23.4    ProBNP (last 3 results) No results for input(s): "PROBNP" in the last 8760 hours.  CBG: No results for input(s): "GLUCAP" in the last 168 hours.  Radiological Exams on Admission: CT ABDOMEN PELVIS W CONTRAST  Result Date: 12/09/2021 CLINICAL DATA:  Back and flank pain with supratherapeutic INR about for retroperitoneal bleed EXAM: CT ABDOMEN AND PELVIS WITH CONTRAST TECHNIQUE: Multidetector CT imaging of the abdomen and pelvis was performed using the standard protocol following bolus administration of intravenous contrast. RADIATION DOSE REDUCTION: This exam was performed according to the departmental dose-optimization program which includes automated exposure control, adjustment of the mA and/or kV according to patient size and/or use of iterative reconstruction technique. CONTRAST:  138mL OMNIPAQUE IOHEXOL 350 MG/ML SOLN COMPARISON:  CT 09/06/2020 FINDINGS: Lower chest: Cardiomegaly.  No pericardial effusion. Hepatobiliary: Hepatic steatosis. No focal liver abnormality is seen. No gallstones, gallbladder wall thickening, or biliary dilatation. Pancreas: Unremarkable. No pancreatic ductal dilatation or surrounding inflammatory changes. Spleen: Normal in size without focal abnormality. Adrenals/Urinary Tract: Adrenal glands are unremarkable. Kidneys are normal, without renal calculi, focal lesion, or hydronephrosis. Bladder is unremarkable. Stomach/Bowel: Stomach is within normal limits. No evidence of bowel wall thickening, distention, or inflammatory changes. Vascular/Lymphatic: IVC filter. Mild aortic atherosclerosis. No abdominopelvic lymphadenopathy. Reproductive: Unremarkable. Other: Nodularity in the anterior subcutaneous abdominal fat  likely related to injections. Spinal cord stimulator with leads at the level of T7. Musculoskeletal: Partially visualized heterogenous fluid within the left anterior thigh compartment musculature. Additional 1.8 cm soft tissue nodule within the left ischioanal fossa. IMPRESSION: Partially visualized left anterior compartment intramuscular hematoma. Additional partially visualized nodule in the left ischioanal fossa may represent an additional hematoma. See separate report for CT femur for additional details. Otherwise no acute abnormality in the abdomen or pelvis. Hepatic steatosis. Cardiomegaly. Aortic Atherosclerosis (ICD10-I70.0). Electronically Signed   By: Placido Sou M.D.   On: 12/09/2021 01:35   CT FEMUR LEFT W CONTRAST  Result Date: 12/09/2021 CLINICAL DATA:  Pain and swelling on Coumadin; eval for hematoma EXAM: CT OF THE LOWER LEFT EXTREMITY WITH  CONTRAST TECHNIQUE: Multidetector CT imaging of the lower left extremity was performed according to the standard protocol following intravenous contrast administration. RADIATION DOSE REDUCTION: This exam was performed according to the departmental dose-optimization program which includes automated exposure control, adjustment of the mA and/or kV according to patient size and/or use of iterative reconstruction technique. CONTRAST:  171mL OMNIPAQUE IOHEXOL 350 MG/ML SOLN COMPARISON:  None Available. FINDINGS: Bones/Joint/Cartilage No acute fracture. Ligaments Suboptimally assessed by CT. Muscles and Tendons Heterogenous poorly defined fluid collection within the left anterior compartment musculature greatest laterally is nonspecific but compatible with intramuscular hematoma. Exam is not optimized to evaluate for active bleeding. Soft tissues Subcutaneous edema within the lower thigh. 1.8 cm soft tissue nodule within the subcutaneous fat of the left ischioanal fossa. IMPRESSION: Ill-defined fluid collection compatible with large intramuscular hematoma  within the left anterior thigh compartment musculature. Exam is not optimized to evaluate for active bleeding. If there is concern for active bleeding recommend CT angiogram of the left lower extremity. Soft tissue nodule within the left ischioanal fossa measuring 1.8 cm is indeterminate but may represent an additional hematoma. Electronically Signed   By: Placido Sou M.D.   On: 12/09/2021 01:28   DG Chest 2 View  Result Date: 12/08/2021 CLINICAL DATA:  Dyspnea EXAM: CHEST - 2 VIEW COMPARISON:  11/04/2021 FINDINGS: The heart size and mediastinal contours are within normal limits. Both lungs are clear. The visualized skeletal structures are unremarkable. Dorsal column stimulator leads are again noted, unchanged. IMPRESSION: No active cardiopulmonary disease. Electronically Signed   By: Fidela Salisbury M.D.   On: 12/08/2021 22:00    EKG: I independently viewed the EKG done and my findings are as followed: Sinus tachycardia rate of 107, nonspecific ST-T changes.  QTc 485.  Assessment/Plan Present on Admission:  Hematoma  Principal Problem:   Hematoma  Large left thigh intramuscular hematoma, seen on CT scan, POA INR supratherapeutic at 7.5, continue to hold Coumadin (The patient stopped taking home coumadin on 12/06/21) Monitor hemoglobin and MAP Maintain hemoglobin greater than 8.0 and MAP greater than 65. Pain control, PRN analgesics and PRN bowel regimen  Recurrent thromboembolism History of PE/DVT Initially on DOAC, Eliquis, but was not taking it properly.  Supratherapeutic INR INR 7.5 Hold off home Coumadin  Elevated troponin, suspect demand ischemia Denies chest pain or any anginal symptoms at the time of the visit. No evidence of acute ischemia on 12-lead EKG Monitor on telemetry  Elevated BP Not on home antihypertensives Hypertension may be contributed by pain Monitor vital signs  Leukocytosis, likely reactive in the setting of hematoma Nonseptic appearing,  afebrile. Repeat CBC and monitor WBC    DVT prophylaxis: Supratherapeutic INR 7.5.  Code Status: Full code  Family Communication: The patient's son is at bedside  Disposition Plan: Admitted to telemetry surgical unit  Consults called: None   Admission status: Inpatient status   Status is: Inpatient The patient requires at least 2 midnights for further evaluation and treatment of present condition.   Kayleen Memos MD [facility] Pager [phone]  If 7PM-7AM, please contact night-coverage [email or web address] Password [facility]  12/09/2021, 2:29 AM

## 2021-12-09 NOTE — Progress Notes (Signed)
PROGRESS NOTE    Justin [name]  OVF:[id number] DOB: July 04, 1965 DOA: 12/08/2021 PCP: [facility]   Brief Narrative:  Justin [name] is a 56 y.o. male with medical history significant for chronic low back pain, followed by spinal surgery, status post stimulator placement, subdural hematoma in the setting of supratherapeutic INR, recurrent thromboembolism, pulmonary embolism, DVT, initially on Coumadin, then switched to Eliquis with IVC filter, then switched to Lovenox injection then Coumadin, OSA on CPAP, who presented to [facility] ED with complaints of left thigh pain and swelling of 2 days duration.  Stopped taking Coumadin 2 days ago as instructed by his PCP due to supra therapeutic INR on Coumadin.  Denies trauma.  INR supratherapeutic at >7 and imaging confirmed large hematoma.  Assessment & Plan:   Principal Problem:   Hematoma Supratherapeutic INR Recurrent thromboembolism  Large left thigh intramuscular hematoma, seen on CT scan, POA -INR supratherapeutic at 7.5 at intake, greater than 10 at PCP office just 2 days prior to admission -Continue to hold Coumadin (The patient stopped taking home coumadin on 12/06/21) -Patient is a somewhat poor historian, unclear why patient was initially on Coumadin transition to Eliquis (which he did not take appropriately -taking only once daily) and then transitioned back to Coumadin with obvious intolerance to Coumadin given his profoundly elevated INR -Dietary to follow to ensure patient understands what a Coumadin diet entails -Attempting to transition back to Eliquis once INR has become more appropriate due to concern over dietary noncompliance.  Patient did not fail Eliquis previously, he was only taking this medication once daily not twice daily hence he was likely subtherapeutic with recurrent thromboembolism.  Recurrent thromboembolism History of PE/DVT Initially on DOAC, Eliquis, but was not taking it properly-we will transition back as  above   Supratherapeutic INR INR 7.5 at intake, greater than 10 at PCP office this week Lab Results  Component Value Date   INR 5.9 (HH) 12/09/2021   INR 7.1 (HH) 12/08/2021   INR 10.5 (HH) 12/05/2021    Elevated troponin, suspect demand ischemia Denies chest pain or any anginal symptoms at the time of the visit. No evidence of acute ischemia on 12-lead EKG Monitor on telemetry   Elevated BP Not on home antihypertensives Hypertension may be contributed by pain Monitor vital signs   Leukocytosis, likely reactive in the setting of hematoma Nonseptic appearing, afebrile. Repeat CBC and monitor WBC   DVT prophylaxis: Holding secondary to above  Code Status: Full code  Family Communication: Son at bedside  Status is: Inpatient  Dispo: The patient is from: Home              Anticipated d/c is to: Home              Anticipated d/c date is: 24 to 48 hours              Patient currently not medically stable for discharge, continues to require close monitoring in the setting of supratherapeutic INR, spontaneous hematoma and high risk for bleed  Consultants:  None  Procedures:  None  Antimicrobials:  None  Subjective: No acute issues or events overnight  Objective: Vitals:   12/09/21 0428 12/09/21 0440 12/09/21 0500 12/09/21 0600  BP:  128/76 135/69 122/79  Pulse:   (!) 105 (!) 103  Resp:  20 18   Temp: 98.3 F (36.8 C)   98.5 F (36.9 C)  TempSrc: Oral   Oral  SpO2:  97% 99% 98%  Weight:  101.2 kg  Height:    5\' 9"  (1.753 m)    Intake/Output Summary (Last 24 hours) at 12/09/2021 0741 Last data filed at 12/09/2021 0500 Gross per 24 hour  Intake --  Output 900 ml  Net -900 ml   Filed Weights   12/09/21 0600  Weight: 101.2 kg    Examination:  General:  Pleasantly resting in bed, No acute distress. HEENT:  Normocephalic atraumatic.  Sclerae nonicteric, noninjected.  Extraocular movements intact bilaterally. Neck:  Without mass or deformity.  Trachea is  midline. Lungs:  Clear to auscultate bilaterally without rhonchi, wheeze, or rales. Heart:  Regular rate and rhythm.  Without murmurs, rubs, or gallops. Abdomen:  Soft, nontender, nondistended.  Without guarding or rebound. Extremities: Left thigh warm, tender to palpate with mild 1+ pitting edema distally. Vascular:  Dorsalis pedis and posterior tibial pulses palpable bilaterally. Skin:  Warm and dry, no erythema, no ulcerations.   Data Reviewed: I have personally reviewed following labs and imaging studies  CBC: Recent Labs  Lab 12/08/21 2007 12/09/21 0443  WBC 14.0* 12.9*  NEUTROABS 10.0* 8.8*  HGB 10.3* 9.9*  HCT 31.2* 30.0*  MCV 85.7 86.7  PLT 291 290   Basic Metabolic Panel: Recent Labs  Lab 12/08/21 2007 12/09/21 0443  NA 138 138  K 3.9 3.7  CL 103 104  CO2 25 23  GLUCOSE 136* 145*  BUN 7 7  CREATININE 1.41* 1.29*  CALCIUM 8.6* 8.6*  MG  --  2.4  PHOS  --  3.2   GFR: Estimated Creatinine Clearance: 73.2 mL/min (A) (by C-G formula based on SCr of 1.29 mg/dL (H)). Liver Function Tests: Recent Labs  Lab 12/09/21 0443  AST 37  ALT 34  ALKPHOS 77  BILITOT 0.6  PROT 6.5  ALBUMIN 3.6   No results for input(s): "LIPASE", "AMYLASE" in the last 168 hours. No results for input(s): "AMMONIA" in the last 168 hours. Coagulation Profile: Recent Labs  Lab 12/05/21 0000 12/08/21 2007  INR 10.5* 7.1*   Cardiac Enzymes: No results for input(s): "CKTOTAL", "CKMB", "CKMBINDEX", "TROPONINI" in the last 168 hours. BNP (last 3 results) No results for input(s): "PROBNP" in the last 8760 hours. HbA1C: No results for input(s): "HGBA1C" in the last 72 hours. CBG: No results for input(s): "GLUCAP" in the last 168 hours. Lipid Profile: No results for input(s): "CHOL", "HDL", "LDLCALC", "TRIG", "CHOLHDL", "LDLDIRECT" in the last 72 hours. Thyroid Function Tests: No results for input(s): "TSH", "T4TOTAL", "FREET4", "T3FREE", "THYROIDAB" in the last 72 hours. Anemia  Panel: No results for input(s): "VITAMINB12", "FOLATE", "FERRITIN", "TIBC", "IRON", "RETICCTPCT" in the last 72 hours. Sepsis Labs: No results for input(s): "PROCALCITON", "LATICACIDVEN" in the last 168 hours.  No results found for this or any previous visit (from the past 240 hour(s)).       Radiology Studies: CT ABDOMEN PELVIS W CONTRAST  Result Date: 12/09/2021 CLINICAL DATA:  Back and flank pain with supratherapeutic INR about for retroperitoneal bleed EXAM: CT ABDOMEN AND PELVIS WITH CONTRAST TECHNIQUE: Multidetector CT imaging of the abdomen and pelvis was performed using the standard protocol following bolus administration of intravenous contrast. RADIATION DOSE REDUCTION: This exam was performed according to the departmental dose-optimization program which includes automated exposure control, adjustment of the mA and/or kV according to patient size and/or use of iterative reconstruction technique. CONTRAST:  12/11/2021 OMNIPAQUE IOHEXOL 350 MG/ML SOLN COMPARISON:  CT 09/06/2020 FINDINGS: Lower chest: Cardiomegaly.  No pericardial effusion. Hepatobiliary: Hepatic steatosis. No focal liver abnormality is seen.  No gallstones, gallbladder wall thickening, or biliary dilatation. Pancreas: Unremarkable. No pancreatic ductal dilatation or surrounding inflammatory changes. Spleen: Normal in size without focal abnormality. Adrenals/Urinary Tract: Adrenal glands are unremarkable. Kidneys are normal, without renal calculi, focal lesion, or hydronephrosis. Bladder is unremarkable. Stomach/Bowel: Stomach is within normal limits. No evidence of bowel wall thickening, distention, or inflammatory changes. Vascular/Lymphatic: IVC filter. Mild aortic atherosclerosis. No abdominopelvic lymphadenopathy. Reproductive: Unremarkable. Other: Nodularity in the anterior subcutaneous abdominal fat likely related to injections. Spinal cord stimulator with leads at the level of T7. Musculoskeletal: Partially visualized  heterogenous fluid within the left anterior thigh compartment musculature. Additional 1.8 cm soft tissue nodule within the left ischioanal fossa. IMPRESSION: Partially visualized left anterior compartment intramuscular hematoma. Additional partially visualized nodule in the left ischioanal fossa may represent an additional hematoma. See separate report for CT femur for additional details. Otherwise no acute abnormality in the abdomen or pelvis. Hepatic steatosis. Cardiomegaly. Aortic Atherosclerosis (ICD10-I70.0). Electronically Signed   By: Placido Sou M.D.   On: 12/09/2021 01:35   CT FEMUR LEFT W CONTRAST  Result Date: 12/09/2021 CLINICAL DATA:  Pain and swelling on Coumadin; eval for hematoma EXAM: CT OF THE LOWER LEFT EXTREMITY WITH CONTRAST TECHNIQUE: Multidetector CT imaging of the lower left extremity was performed according to the standard protocol following intravenous contrast administration. RADIATION DOSE REDUCTION: This exam was performed according to the departmental dose-optimization program which includes automated exposure control, adjustment of the mA and/or kV according to patient size and/or use of iterative reconstruction technique. CONTRAST:  126mL OMNIPAQUE IOHEXOL 350 MG/ML SOLN COMPARISON:  None Available. FINDINGS: Bones/Joint/Cartilage No acute fracture. Ligaments Suboptimally assessed by CT. Muscles and Tendons Heterogenous poorly defined fluid collection within the left anterior compartment musculature greatest laterally is nonspecific but compatible with intramuscular hematoma. Exam is not optimized to evaluate for active bleeding. Soft tissues Subcutaneous edema within the lower thigh. 1.8 cm soft tissue nodule within the subcutaneous fat of the left ischioanal fossa. IMPRESSION: Ill-defined fluid collection compatible with large intramuscular hematoma within the left anterior thigh compartment musculature. Exam is not optimized to evaluate for active bleeding. If there is  concern for active bleeding recommend CT angiogram of the left lower extremity. Soft tissue nodule within the left ischioanal fossa measuring 1.8 cm is indeterminate but may represent an additional hematoma. Electronically Signed   By: Placido Sou M.D.   On: 12/09/2021 01:28   DG Chest 2 View  Result Date: 12/08/2021 CLINICAL DATA:  Dyspnea EXAM: CHEST - 2 VIEW COMPARISON:  11/04/2021 FINDINGS: The heart size and mediastinal contours are within normal limits. Both lungs are clear. The visualized skeletal structures are unremarkable. Dorsal column stimulator leads are again noted, unchanged. IMPRESSION: No active cardiopulmonary disease. Electronically Signed   By: Fidela Salisbury M.D.   On: 12/08/2021 22:00    Scheduled Meds:  senna-docusate  1 tablet Oral QHS   Continuous Infusions:   LOS: 0 days   Time spent: 96min  Hilde Churchman C Kanon Novosel, DO [facility]  If 7PM-7AM, please contact night-coverage [email or web address]  12/09/2021, 7:41 AM

## 2021-12-09 NOTE — Progress Notes (Signed)
ANTICOAGULATION CONSULT NOTE - Initial Consult  Pharmacy Consult for apixaban Indication:  Hx of DVT   Allergies  Allergen Reactions   Lyrica [Pregabalin] Other (See Comments)    Hallucinations    Patient Measurements: Height: 5\' 9"  (175.3 cm) Weight: 101.2 kg (223 lb 1.7 oz) IBW/kg (Calculated) : 70.7  Vital Signs: Temp: 98.3 F (36.8 C) (09/23 0820) Temp Source: Oral (09/23 0820) BP: 122/79 (09/23 0600) Pulse Rate: 103 (09/23 0600)  Labs: Recent Labs    12/08/21 2007 12/08/21 2229 12/09/21 0443 12/09/21 0453  HGB 10.3*  --  9.9*  --   HCT 31.2*  --  30.0*  --   PLT 291  --  290  --   LABPROT 60.5*  --   --  52.6*  INR 7.1*  --   --  5.9*  CREATININE 1.41*  --  1.29*  --   TROPONINIHS 28* 31*  --   --     Estimated Creatinine Clearance: 73.2 mL/min (A) (by C-G formula based on SCr of 1.29 mg/dL (H)).   Medical History: Past Medical History:  Diagnosis Date   Achilles rupture, left    Bipolar 1 disorder (HCC)    Dental caries    periodontitis   Pneumonia    Pulmonary embolism (HCC) 05/18/2007   Sleep apnea    wears CPAP   Subdural hematoma (HCC) 01/04/2013   in setting of supratherapeutic INR    Medications:  Medications Prior to Admission  Medication Sig Dispense Refill Last Dose   acetaminophen (TYLENOL) 500 MG tablet Take 1 tablet (500 mg total) by mouth every 6 (six) hours as needed. 30 tablet 0    albuterol (VENTOLIN HFA) 108 (90 Base) MCG/ACT inhaler Inhale 1-2 puffs into the lungs every 6 (six) hours as needed for wheezing or shortness of breath. 1 each 0    azithromycin (ZITHROMAX) 250 MG tablet Take 1 tablet (250 mg total) by mouth daily. Take first 2 tablets together, then 1 every day until finished. 6 tablet 0    cyclobenzaprine (FLEXERIL) 10 MG tablet Take 1 tablet (10 mg total) by mouth 3 (three) times daily as needed for muscle spasms. 30 tablet 0    doxepin (SINEQUAN) 75 MG capsule Take 75 mg by mouth at bedtime.      doxycycline  (VIBRAMYCIN) 100 MG capsule Take 1 capsule (100 mg total) by mouth 2 (two) times daily. One po bid x 7 days 14 capsule 0    enoxaparin (LOVENOX) 100 MG/ML injection Inject 1 mL (100 mg total) into the skin every 12 (twelve) hours. 60 mL 0    gabapentin (NEURONTIN) 300 MG capsule Take 1 capsule (300 mg total) by mouth 3 (three) times daily for 14 days. 42 capsule 0    HYDROcodone-acetaminophen (NORCO/VICODIN) 5-325 MG tablet Take 1 tablet by mouth 3 (three) times daily as needed. 5 tablet 0    hydrOXYzine (ATARAX/VISTARIL) 25 MG tablet Take 25 mg by mouth at bedtime.      lidocaine (LIDODERM) 5 % Place 1 patch onto the skin daily. Remove & Discard patch within 12 hours or as directed by MD 30 patch 0    lithium 600 MG capsule Take 600 mg by mouth at bedtime.      lithium carbonate (ESKALITH) 450 MG CR tablet Take 450 mg by mouth daily.      mirtazapine (REMERON) 45 MG tablet Take 45 mg by mouth at bedtime.      predniSONE (DELTASONE) 20 MG tablet  Take 2 tablets (40 mg total) by mouth daily. 6 tablet 0    tiZANidine (ZANAFLEX) 4 MG tablet Take 4 mg by mouth at bedtime.       warfarin (COUMADIN) 5 MG tablet Take 1 tablet (5 mg total) by mouth daily. 30 tablet 0     Assessment: 56 YO male presenting 9/21  with medical history significant for chronic low back pain, followed by spinal surgery, status post stimulator placement, subdural hematoma in the setting of supratherapeutic INR, recurrent thromboembolism, pulmonary embolism, DVT.   Patient was on warfarin PTA but told to stop taking on 9/19 by his PCP due to a supratherapeutic INR of 10.5. Patient was initially on warfarin>>switched to Eliquis x 3 months>>switched back to warfarin w/ Lovenox bridge 8/23. Upon further questioning, patient found to only be taking Eliquis once daily as opposed to BID--pharmacy consulted for Eliquis restart.   Patient's INR remains elevated @ 5.9. Hgb 10.3>>9.9 from yesterday, plts 290. Per Dr. Avon Gully, will continue  to hold anticoagulation for a few more days given hematoma dx with plan to restart Eliquis after resolution.   Goal of Therapy:  Monitor platelets by anticoagulation protocol: Yes   Plan:  Hold Eliquis restart until hematoma resolution per Dr.[place] Monitor CBC, s/sx of bleeding daily    Billey Gosling, PharmD PGY1 Pharmacy Resident 9/23/202311:25 AM

## 2021-12-09 NOTE — ED Provider Notes (Signed)
[facility] EMERGENCY DEPARTMENT Provider Note   CSN: [id number] Arrival date & time: 12/08/21  1848     History  Chief Complaint  Patient presents with   Thigh Pain     PRESTYN STANCO is a 56 y.o. male.  Patient presents to the emergency department for evaluation of left thigh pain.  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  Patient recently diagnosed with right leg DVT while on Eliquis.  He was switched to Lovenox and bridged to Coumadin.  Patient now complaining of swelling and pain of the left thigh.  No calf pain or tenderness, which is what he reports he usually feels when he has a DVT.  Patient denies any injury.  He is having trouble walking because of the pain.       Home Medications Prior to Admission medications   Medication Sig Start Date End Date Taking? Authorizing Provider  acetaminophen (TYLENOL) 500 MG tablet Take 1 tablet (500 mg total) by mouth every 6 (six) hours as needed. 08/28/19   Fawze, Mina A, PA-C  albuterol (VENTOLIN HFA) 108 (90 Base) MCG/ACT inhaler Inhale 1-2 puffs into the lungs every 6 (six) hours as needed for wheezing or shortness of breath. 03/21/21   Rhys Martini, PA-C  azithromycin (ZITHROMAX) 250 MG tablet Take 1 tablet (250 mg total) by mouth daily. Take first 2 tablets together, then 1 every day until finished. 03/19/21   Placido Sou, PA-C  cyclobenzaprine (FLEXERIL) 10 MG tablet Take 1 tablet (10 mg total) by mouth 3 (three) times daily as needed for muscle spasms. 12/03/21   Redwine, Madison A, PA-C  doxepin (SINEQUAN) 75 MG capsule Take 75 mg by mouth at bedtime. 12/14/19   [provider]  doxycycline (VIBRAMYCIN) 100 MG capsule Take 1 capsule (100 mg total) by mouth 2 (two) times daily. One po bid x 7 days 11/04/21   Zadie Rhine, MD  enoxaparin (LOVENOX) 100 MG/ML injection Inject 1 mL (100 mg total) into the skin every 12 (twelve) hours. 09/23/21   Benjiman Core, MD  gabapentin (NEURONTIN)  300 MG capsule Take 1 capsule (300 mg total) by mouth 3 (three) times daily for 14 days. 12/03/21 12/17/21  Redwine, Madison A, PA-C  HYDROcodone-acetaminophen (NORCO/VICODIN) 5-325 MG tablet Take 1 tablet by mouth 3 (three) times daily as needed. 12/03/21   Redwine, Madison A, PA-C  hydrOXYzine (ATARAX/VISTARIL) 25 MG tablet Take 25 mg by mouth at bedtime. 08/22/19   [provider]  lidocaine (LIDODERM) 5 % Place 1 patch onto the skin daily. Remove & Discard patch within 12 hours or as directed by MD 12/03/21   Redwine, Madison A, PA-C  lithium 600 MG capsule Take 600 mg by mouth at bedtime.    [provider]  lithium carbonate (ESKALITH) 450 MG CR tablet Take 450 mg by mouth daily. 12/14/19   [provider]  mirtazapine (REMERON) 45 MG tablet Take 45 mg by mouth at bedtime.    [provider]  predniSONE (DELTASONE) 20 MG tablet Take 2 tablets (40 mg total) by mouth daily. 09/23/21   Benjiman Core, MD  tiZANidine (ZANAFLEX) 4 MG tablet Take 4 mg by mouth at bedtime.  12/17/19   [provider]  warfarin (COUMADIN) 5 MG tablet Take 1 tablet (5 mg total) by mouth daily. 10/19/21   Briant Cedar, PA-C      Allergies    Lyrica [pregabalin]    Review of Systems  Review of Systems  Physical Exam Updated Vital Signs BP (!) 138/99 (BP Location: Left Arm)   Pulse 98   Temp 98.3 F (36.8 C) (Oral)   Resp 16   SpO2 99%  Physical Exam Vitals and nursing note reviewed.  Constitutional:      General: He is not in acute distress.    Appearance: He is well-developed.  HENT:     Head: Normocephalic and atraumatic.     Mouth/Throat:     Mouth: Mucous membranes are moist.  Eyes:     General: Vision grossly intact. Gaze aligned appropriately.     Extraocular Movements: Extraocular movements intact.     Conjunctiva/sclera: Conjunctivae normal.  Cardiovascular:     Rate and Rhythm: Normal rate and regular rhythm.     Pulses: Normal pulses.     Heart  sounds: Normal heart sounds, S1 normal and S2 normal. No murmur heard.    No friction rub. No gallop.  Pulmonary:     Effort: Pulmonary effort is normal. No respiratory distress.     Breath sounds: Normal breath sounds.  Abdominal:     Palpations: Abdomen is soft.     Tenderness: There is no abdominal tenderness. There is no guarding or rebound.     Hernia: No hernia is present.  Musculoskeletal:        General: No swelling.     Cervical back: Full passive range of motion without pain, normal range of motion and neck supple. No pain with movement, spinous process tenderness or muscular tenderness. Normal range of motion.     Left upper leg: Swelling and tenderness present.     Right lower leg: No edema.     Left lower leg: No edema.  Skin:    General: Skin is warm and dry.     Capillary Refill: Capillary refill takes less than 2 seconds.     Findings: No ecchymosis, erythema, lesion or wound.  Neurological:     Mental Status: He is alert and oriented to person, place, and time.     GCS: GCS eye subscore is 4. GCS verbal subscore is 5. GCS motor subscore is 6.     Cranial Nerves: Cranial nerves 2-12 are intact.     Sensory: Sensation is intact.     Motor: Motor function is intact. No weakness or abnormal muscle tone.     Coordination: Coordination is intact.  Psychiatric:        Mood and Affect: Mood normal.        Speech: Speech normal.        Behavior: Behavior normal.     ED Results / Procedures / Treatments   Labs (all labs ordered are listed, but only abnormal results are displayed) Labs Reviewed  PROTIME-INR - Abnormal; Notable for the following components:      Result Value   Prothrombin Time 60.5 (*)    INR 7.1 (*)    All other components within normal limits  CBC WITH DIFFERENTIAL/PLATELET - Abnormal; Notable for the following components:   WBC 14.0 (*)    RBC 3.64 (*)    Hemoglobin 10.3 (*)    HCT 31.2 (*)    Neutro Abs 10.0 (*)    Monocytes Absolute 1.1 (*)     Abs Immature Granulocytes 0.10 (*)    All other components within normal limits  BASIC METABOLIC PANEL - Abnormal; Notable for the following components:   Glucose, Bld 136 (*)    Creatinine, Ser 1.41 (*)  Calcium 8.6 (*)    GFR, Estimated 58 (*)    All other components within normal limits  TROPONIN I (HIGH SENSITIVITY) - Abnormal; Notable for the following components:   Troponin I (High Sensitivity) 28 (*)    All other components within normal limits  TROPONIN I (HIGH SENSITIVITY) - Abnormal; Notable for the following components:   Troponin I (High Sensitivity) 31 (*)    All other components within normal limits  D-DIMER, QUANTITATIVE  BRAIN NATRIURETIC PEPTIDE    EKG EKG Interpretation  Date/Time:  Friday December 08 2021 20:08:43 EDT Ventricular Rate:  107 PR Interval:  160 QRS Duration: 96 QT Interval:  364 QTC Calculation: 485 R Axis:   -16 Text Interpretation: Sinus tachycardia with Premature supraventricular complexes Right atrial enlargement Minimal voltage criteria for LVH, may be normal variant ( R in aVL ) Nonspecific T wave abnormality Abnormal ECG When compared with ECG of 03-Nov-2021 23:29, no change Confirmed by Orpah Greek [phone]) on 12/08/2021 11:05:01 PM  Radiology CT ABDOMEN PELVIS W CONTRAST  Result Date: 12/09/2021 CLINICAL DATA:  Back and flank pain with supratherapeutic INR about for retroperitoneal bleed EXAM: CT ABDOMEN AND PELVIS WITH CONTRAST TECHNIQUE: Multidetector CT imaging of the abdomen and pelvis was performed using the standard protocol following bolus administration of intravenous contrast. RADIATION DOSE REDUCTION: This exam was performed according to the departmental dose-optimization program which includes automated exposure control, adjustment of the mA and/or kV according to patient size and/or use of iterative reconstruction technique. CONTRAST:  162mL OMNIPAQUE IOHEXOL 350 MG/ML SOLN COMPARISON:  CT 09/06/2020 FINDINGS: Lower  chest: Cardiomegaly.  No pericardial effusion. Hepatobiliary: Hepatic steatosis. No focal liver abnormality is seen. No gallstones, gallbladder wall thickening, or biliary dilatation. Pancreas: Unremarkable. No pancreatic ductal dilatation or surrounding inflammatory changes. Spleen: Normal in size without focal abnormality. Adrenals/Urinary Tract: Adrenal glands are unremarkable. Kidneys are normal, without renal calculi, focal lesion, or hydronephrosis. Bladder is unremarkable. Stomach/Bowel: Stomach is within normal limits. No evidence of bowel wall thickening, distention, or inflammatory changes. Vascular/Lymphatic: IVC filter. Mild aortic atherosclerosis. No abdominopelvic lymphadenopathy. Reproductive: Unremarkable. Other: Nodularity in the anterior subcutaneous abdominal fat likely related to injections. Spinal cord stimulator with leads at the level of T7. Musculoskeletal: Partially visualized heterogenous fluid within the left anterior thigh compartment musculature. Additional 1.8 cm soft tissue nodule within the left ischioanal fossa. IMPRESSION: Partially visualized left anterior compartment intramuscular hematoma. Additional partially visualized nodule in the left ischioanal fossa may represent an additional hematoma. See separate report for CT femur for additional details. Otherwise no acute abnormality in the abdomen or pelvis. Hepatic steatosis. Cardiomegaly. Aortic Atherosclerosis (ICD10-I70.0). Electronically Signed   By: Placido Sou M.D.   On: 12/09/2021 01:35   CT FEMUR LEFT W CONTRAST  Result Date: 12/09/2021 CLINICAL DATA:  Pain and swelling on Coumadin; eval for hematoma EXAM: CT OF THE LOWER LEFT EXTREMITY WITH CONTRAST TECHNIQUE: Multidetector CT imaging of the lower left extremity was performed according to the standard protocol following intravenous contrast administration. RADIATION DOSE REDUCTION: This exam was performed according to the departmental dose-optimization program  which includes automated exposure control, adjustment of the mA and/or kV according to patient size and/or use of iterative reconstruction technique. CONTRAST:  175mL OMNIPAQUE IOHEXOL 350 MG/ML SOLN COMPARISON:  None Available. FINDINGS: Bones/Joint/Cartilage No acute fracture. Ligaments Suboptimally assessed by CT. Muscles and Tendons Heterogenous poorly defined fluid collection within the left anterior compartment musculature greatest laterally is nonspecific but compatible with intramuscular hematoma. Exam is not optimized  to evaluate for active bleeding. Soft tissues Subcutaneous edema within the lower thigh. 1.8 cm soft tissue nodule within the subcutaneous fat of the left ischioanal fossa. IMPRESSION: Ill-defined fluid collection compatible with large intramuscular hematoma within the left anterior thigh compartment musculature. Exam is not optimized to evaluate for active bleeding. If there is concern for active bleeding recommend CT angiogram of the left lower extremity. Soft tissue nodule within the left ischioanal fossa measuring 1.8 cm is indeterminate but may represent an additional hematoma. Electronically Signed   By: Minerva Fester M.D.   On: 12/09/2021 01:28   DG Chest 2 View  Result Date: 12/08/2021 CLINICAL DATA:  Dyspnea EXAM: CHEST - 2 VIEW COMPARISON:  11/04/2021 FINDINGS: The heart size and mediastinal contours are within normal limits. Both lungs are clear. The visualized skeletal structures are unremarkable. Dorsal column stimulator leads are again noted, unchanged. IMPRESSION: No active cardiopulmonary disease. Electronically Signed   By: Helyn Numbers M.D.   On: 12/08/2021 22:00    Procedures Procedures    Medications Ordered in ED Medications  iohexol (OMNIPAQUE) 350 MG/ML injection 100 mL (100 mLs Intravenous Contrast Given 12/09/21 0105)    ED Course/ Medical Decision Making/ A&P                           Medical Decision Making Amount and/or Complexity of Data  Reviewed Radiology: ordered.  Risk Prescription drug management.   Patient presents with left thigh pain and swelling.  Patient recently diagnosed with right leg DVT.  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  His most recent DVT was diagnosed while on Eliquis, was transitioned to Coumadin.  Examination reveals significant tenderness and swelling of the left thigh compared to the right.  D-dimer is negative.  Patient's hemoglobin has dropped from 13 to 10.  While DVT was in the differential, spontaneous bleed into the thigh muscle was considered more likely.  Patient underwent CT scan of abdomen, pelvis and thigh.  No intra-abdominal or retroperitoneal bleed seen.  Patient does have a large anterior hematoma in the thigh muscle which explains his current symptoms.  No symptomatology that would suggest compartment syndrome at this time, normal distal color, sensation, pulses.  Will need close monitoring.  Additionally, INR cannot be aggressively reversed because of his significant history of clots.  We will therefore request admission, hold Coumadin and close monitoring of the thigh.        Final Clinical Impression(s) / ED Diagnoses Final diagnoses:  Hematoma    Rx / DC Orders ED Discharge Orders     None         Maclovio Henson, Canary Brim, MD 12/09/21 0225

## 2021-12-09 NOTE — ED Notes (Signed)
Patient dressed into hospital gown and reconnected to bedside monitor. Patient provided with bedside urinal. Pt requesting coca-cola at this time and it was provided. Patient repositioned in the bed with multiple pillows and warm blankets. Patient is resting comfortably with RR even and regular with no other complaints at this time

## 2021-12-09 NOTE — Progress Notes (Signed)
CSW acknowledged consult about pt being concerned about his son due to him being his caretaker.Pt explained that his son will not stay with anyone else and has been at the hospital with him. He explains that his son is very attached him therefore someone keeping him at this point is not an option. CSW inquired about food and pt explained that his son has been helping him eat his food as well as he can get his son a sandwich. CSW witnessed son in chair and was not disruptive and watching tv.

## 2021-12-10 DIAGNOSIS — T148XXA Other injury of unspecified body region, initial encounter: Secondary | ICD-10-CM | POA: Diagnosis not present

## 2021-12-10 LAB — CBC
HCT: 29.6 % — ABNORMAL LOW (ref 39.0–52.0)
Hemoglobin: 9.7 g/dL — ABNORMAL LOW (ref 13.0–17.0)
MCH: 28.4 pg (ref 26.0–34.0)
MCHC: 32.8 g/dL (ref 30.0–36.0)
MCV: 86.5 fL (ref 80.0–100.0)
Platelets: 295 10*3/uL (ref 150–400)
RBC: 3.42 MIL/uL — ABNORMAL LOW (ref 4.22–5.81)
RDW: 14.7 % (ref 11.5–15.5)
WBC: 12.2 10*3/uL — ABNORMAL HIGH (ref 4.0–10.5)
nRBC: 0.2 % (ref 0.0–0.2)

## 2021-12-10 LAB — PROTIME-INR
INR: 2.9 — ABNORMAL HIGH (ref 0.8–1.2)
Prothrombin Time: 30.3 seconds — ABNORMAL HIGH (ref 11.4–15.2)

## 2021-12-10 MED ORDER — APIXABAN 5 MG PO TABS: 5.0000 mg | ORAL_TABLET | Freq: Two times a day (BID) | ORAL | 1 refills | Status: DC

## 2021-12-10 NOTE — Evaluation (Signed)
Physical Therapy Evaluation Patient Details Name: Justin [name] MRN: [id number] DOB: [birth year] Today's Date: 12/10/2021  History of Present Illness  Patient is a 56 y/o male who presents on 9/22 with left thigh swelling and pain. Found to have spontaneous left thigh hematoma. PMH includes bipolar, sleep apnea, left achilles tendon rupture, PE/DVT s/p filter, back surgery, spinal cord stimulator.  Clinical Impression  Patient presents with LLE pain and impaired mobility s/p above. Pt lives at home with disabled son and helps care for him at baseline. Today, pt tolerated transfers and ambulation with supervision-Mod I for safety. Balance improved with use of RW for support with heavy reliance on UEs to off load LLE. Swelling noted in LLE. Educated pt on importance of AROM of LLE esp stretching the anterior thigh once swelling improves. RW ordered. Pt does not require further skilled therapy services as pt functioning at supervision level with RW. All education completed. Discharge from therapy.       Recommendations for follow up therapy are one component of a multi-disciplinary discharge planning process, led by the attending physician.  Recommendations may be updated based on patient status, additional functional criteria and insurance authorization.  Follow Up Recommendations No PT follow up      Assistance Recommended at Discharge PRN  Patient can return home with the following  Help with stairs or ramp for entrance;Assistance with cooking/housework;A little help with walking and/or transfers;A little help with bathing/dressing/bathroom    Equipment Recommendations Rolling walker (2 wheels)  Recommendations for Other Services       Functional Status Assessment Patient has not had a recent decline in their functional status     Precautions / Restrictions Precautions Precautions: Fall Restrictions Weight Bearing Restrictions: No      Mobility  Bed Mobility Overal bed mobility:  Modified Independent             General bed mobility comments: No assist needed.    Transfers Overall transfer level: Modified independent Equipment used: Rolling walker (2 wheels)               General transfer comment: Stood from EOB x1 with use of RW. Pt pulling up on RW for support, cues for hand placement.    Ambulation/Gait Ambulation/Gait assistance: Supervision Gait Distance (Feet): 16 Feet Assistive device: Rolling walker (2 wheels) Gait Pattern/deviations: Step-to pattern, Step-through pattern, Decreased stance time - right, Decreased step length - left Gait velocity: decreased     General Gait Details: Slow, steady gait with heavy reliance on UEs for support. Decreased stance time LLE. Adjusted RW height.  Stairs            Wheelchair Mobility    Modified Rankin (Stroke Patients Only)       Balance Overall balance assessment: Mild deficits observed, not formally tested                                           Pertinent Vitals/Pain Pain Assessment Pain Assessment: Faces Faces Pain Scale: Hurts little more Pain Location: left thigh with weightbearing Pain Descriptors / Indicators: Guarding, Sore, Aching Pain Intervention(s): Monitored during session, Repositioned    Home Living Family/patient expects to be discharged to:: Private residence Living Arrangements: Children Available Help at Discharge: Family Type of Home: Apartment Home Access: Level entry       Home Layout: One level Home Equipment: Crutches;BSC/3in1  Prior Function Prior Level of Function : Independent/Modified Independent             Mobility Comments: Using crutches for about 1 week ADLs Comments: independent     Hand Dominance   Dominant Hand: Left    Extremity/Trunk Assessment   Upper Extremity Assessment Upper Extremity Assessment: Defer to OT evaluation    Lower Extremity Assessment Lower Extremity Assessment: LLE  deficits/detail LLE Deficits / Details: swelling in left thigh    Cervical / Trunk Assessment Cervical / Trunk Assessment: Normal  Communication   Communication: No difficulties  Cognition Arousal/Alertness: Awake/alert Behavior During Therapy: WFL for tasks assessed/performed Overall Cognitive Status: Within Functional Limits for tasks assessed                                          General Comments General comments (skin integrity, edema, etc.): Son present in room sleeping in chair.    Exercises     Assessment/Plan    PT Assessment Patient does not need any further PT services  PT Problem List         PT Treatment Interventions      PT Goals (Current goals can be found in the Care Plan section)  Acute Rehab PT Goals Patient Stated Goal: to go home and get a RW PT Goal Formulation: All assessment and education complete, DC therapy    Frequency       Co-evaluation               AM-PAC PT "6 Clicks" Mobility  Outcome Measure Help needed turning from your back to your side while in a flat bed without using bedrails?: None Help needed moving from lying on your back to sitting on the side of a flat bed without using bedrails?: None Help needed moving to and from a bed to a chair (including a wheelchair)?: None Help needed standing up from a chair using your arms (e.g., wheelchair or bedside chair)?: A Little Help needed to walk in hospital room?: Total Help needed climbing 3-5 steps with a railing? : A Little 6 Click Score: 19    End of Session   Activity Tolerance: Patient tolerated treatment well Patient left: in bed;with call bell/phone within reach Nurse Communication: Mobility status PT Visit Diagnosis: Pain;Difficulty in walking, not elsewhere classified (R26.2) Pain - Right/Left: Left Pain - part of body: Leg    Time: [id number] PT Time Calculation (min) (ACUTE ONLY): 17 min   Charges:   PT Evaluation $PT Eval Moderate  Complexity: 1 Mod          Marisa Severin, PT, DPT Acute Rehabilitation Services Secure chat preferred Office [phone]     Marguarite Arbour A Jennice Renegar 12/10/2021, 11:13 AM

## 2021-12-10 NOTE — Progress Notes (Signed)
RT went to assess if pt would wear CPAP and pt is asleep let son and RN know if pt would like to put on CPAP to call RT. SpO2 currently 100% at bedside. RT will continue to monitor.

## 2021-12-10 NOTE — Discharge Summary (Signed)
Physician Discharge Summary  Justin [name] [id number] DOB: [birth year] DOA: 12/08/2021  PCP: [name]  Admit date: 12/08/2021 Discharge date: 12/10/2021  Admitted From: Home Disposition: Home  Recommendations for Outpatient Follow-up:  Follow up with PCP in 1-2 weeks:  Home Health: None Equipment/Devices: Walker  Discharge Condition: Stable CODE STATUS: Full Diet recommendation: Low-salt low-fat diet  Brief/Interim Summary: [name] is a 56 y.o. male with medical history significant for chronic low back pain, followed by spinal surgery, status post stimulator placement, subdural hematoma in the setting of supratherapeutic INR, recurrent thromboembolism, pulmonary embolism, DVT, initially on Coumadin, then switched to Eliquis with IVC filter, then switched to Lovenox injection then Coumadin, OSA on CPAP, who presented to [facility] ED with complaints of left thigh pain and swelling of 2 days duration.  Stopped taking Coumadin 2 days ago as instructed by his PCP due to supra therapeutic INR on Coumadin.  Denies trauma.  INR supratherapeutic at >7 and imaging confirmed large hematoma.  Patient's left thigh hematoma is improving, able to ambulate with walker today without instability.  Pain well controlled.  Transition to Eliquis as discussed below given labile INR with Coumadin placing patient at unwarranted risk for hypercoagulable state or uncontrolled in the setting of recurrent thromboembolism.  Request that he resume this medication starting tomorrow on the 25th as his INR continues to downtrend, currently 2.9.  Close follow-up with PCP in the next 1 to 2 weeks for further discussion on anticoagulation as well as to reevaluate left thigh hematoma.  We discussed that if this hematoma worsens at all with worsening pain or increase in size he should stop Eliquis immediately and return to the hospital or call PCP for further evaluation.  This appears to be a spontaneous bleed given he  denies any trauma with an INR of 10 this is not surprising.  On Eliquis he should be much more appropriately controlled without profound swings in his anticoagulation setting.   Assessment & Plan:  Spontaneous large left thigh intramuscular hematoma, seen on CT scan, POA -INR supratherapeutic at 7.5 at intake, greater than 10 at PCP office just 2 days prior to admission -INR 2.9, transition to Eliquis given reasonable pricing/coupon provided  Recurrent thromboembolism History of PE/DVT Initially on DOAC, Eliquis, but was not taking it properly-we will transition back as above   Supratherapeutic INR INR 7.5 at intake, greater than 10 at PCP office this week Lab Results  Component Value Date   INR 2.9 (H) 12/10/2021   INR 5.9 (HH) 12/09/2021   INR 7.1 (HH) 12/08/2021    Elevated troponin, suspect demand ischemia in the setting of hematoma -No indication for further work-up, patient remains clinically stable denies nausea vomiting chest pain shortness of breath -EKG without overt ST changes   Elevated BP Likely reactive in the setting of pain, follow-up with PCP, currently well controlled   Leukocytosis, likely reactive in the setting of hematoma Reactive, downtrending appropriately with supportive care  Discharge Diagnoses:  Principal Problem:   Hematoma    Discharge Instructions   Allergies as of 12/10/2021       Reactions   Lyrica [pregabalin] Other (See Comments)   Hallucinations        Medication List     STOP taking these medications    azithromycin 250 MG tablet Commonly known as: ZITHROMAX   doxycycline 100 MG capsule Commonly known as: VIBRAMYCIN   enoxaparin 100 MG/ML injection Commonly known as: Lovenox   lidocaine 5 % Commonly  known as: Lidoderm   warfarin 5 MG tablet Commonly known as: COUMADIN       TAKE these medications    acetaminophen 500 MG tablet Commonly known as: TYLENOL Take 1 tablet (500 mg total) by mouth every 6 (six)  hours as needed. What changed: reasons to take this   albuterol 108 (90 Base) MCG/ACT inhaler Commonly known as: VENTOLIN HFA Inhale 1-2 puffs into the lungs every 6 (six) hours as needed for wheezing or shortness of breath.   apixaban 5 MG Tabs tablet Commonly known as: ELIQUIS Take 1 tablet (5 mg total) by mouth 2 (two) times daily. Start taking on: December 11, 2021   cyclobenzaprine 10 MG tablet Commonly known as: FLEXERIL Take 1 tablet (10 mg total) by mouth 3 (three) times daily as needed for muscle spasms.   dicyclomine 10 MG capsule Commonly known as: BENTYL Take 10 mg by mouth 3 (three) times daily as needed for spasms.   doxepin 75 MG capsule Commonly known as: SINEQUAN Take 75 mg by mouth at bedtime.   gabapentin 300 MG capsule Commonly known as: NEURONTIN Take 1 capsule (300 mg total) by mouth 3 (three) times daily for 14 days.   HYDROcodone-acetaminophen 5-325 MG tablet Commonly known as: NORCO/VICODIN Take 1 tablet by mouth 3 (three) times daily as needed. What changed: reasons to take this   lithium 600 MG capsule Take 600 mg by mouth at bedtime.   mirtazapine 45 MG tablet Commonly known as: REMERON Take 45 mg by mouth at bedtime.   predniSONE 20 MG tablet Commonly known as: DELTASONE Take 2 tablets (40 mg total) by mouth daily.   QUEtiapine 400 MG 24 hr tablet Commonly known as: SEROQUEL XR Take 800 mg by mouth at bedtime.   tiZANidine 4 MG tablet Commonly known as: ZANAFLEX Take 4 mg by mouth at bedtime.        Allergies  Allergen Reactions   Lyrica [Pregabalin] Other (See Comments)    Hallucinations    Consultations: None   Procedures/Studies: CT ABDOMEN PELVIS W CONTRAST  Result Date: 12/09/2021 CLINICAL DATA:  Back and flank pain with supratherapeutic INR about for retroperitoneal bleed EXAM: CT ABDOMEN AND PELVIS WITH CONTRAST TECHNIQUE: Multidetector CT imaging of the abdomen and pelvis was performed using the standard  protocol following bolus administration of intravenous contrast. RADIATION DOSE REDUCTION: This exam was performed according to the departmental dose-optimization program which includes automated exposure control, adjustment of the mA and/or kV according to patient size and/or use of iterative reconstruction technique. CONTRAST:  119mL OMNIPAQUE IOHEXOL 350 MG/ML SOLN COMPARISON:  CT 09/06/2020 FINDINGS: Lower chest: Cardiomegaly.  No pericardial effusion. Hepatobiliary: Hepatic steatosis. No focal liver abnormality is seen. No gallstones, gallbladder wall thickening, or biliary dilatation. Pancreas: Unremarkable. No pancreatic ductal dilatation or surrounding inflammatory changes. Spleen: Normal in size without focal abnormality. Adrenals/Urinary Tract: Adrenal glands are unremarkable. Kidneys are normal, without renal calculi, focal lesion, or hydronephrosis. Bladder is unremarkable. Stomach/Bowel: Stomach is within normal limits. No evidence of bowel wall thickening, distention, or inflammatory changes. Vascular/Lymphatic: IVC filter. Mild aortic atherosclerosis. No abdominopelvic lymphadenopathy. Reproductive: Unremarkable. Other: Nodularity in the anterior subcutaneous abdominal fat likely related to injections. Spinal cord stimulator with leads at the level of T7. Musculoskeletal: Partially visualized heterogenous fluid within the left anterior thigh compartment musculature. Additional 1.8 cm soft tissue nodule within the left ischioanal fossa. IMPRESSION: Partially visualized left anterior compartment intramuscular hematoma. Additional partially visualized nodule in the left ischioanal fossa may represent an  additional hematoma. See separate report for CT femur for additional details. Otherwise no acute abnormality in the abdomen or pelvis. Hepatic steatosis. Cardiomegaly. Aortic Atherosclerosis (ICD10-I70.0). Electronically Signed   By: Placido Sou M.D.   On: 12/09/2021 01:35   CT FEMUR LEFT W  CONTRAST  Result Date: 12/09/2021 CLINICAL DATA:  Pain and swelling on Coumadin; eval for hematoma EXAM: CT OF THE LOWER LEFT EXTREMITY WITH CONTRAST TECHNIQUE: Multidetector CT imaging of the lower left extremity was performed according to the standard protocol following intravenous contrast administration. RADIATION DOSE REDUCTION: This exam was performed according to the departmental dose-optimization program which includes automated exposure control, adjustment of the mA and/or kV according to patient size and/or use of iterative reconstruction technique. CONTRAST:  160mL OMNIPAQUE IOHEXOL 350 MG/ML SOLN COMPARISON:  None Available. FINDINGS: Bones/Joint/Cartilage No acute fracture. Ligaments Suboptimally assessed by CT. Muscles and Tendons Heterogenous poorly defined fluid collection within the left anterior compartment musculature greatest laterally is nonspecific but compatible with intramuscular hematoma. Exam is not optimized to evaluate for active bleeding. Soft tissues Subcutaneous edema within the lower thigh. 1.8 cm soft tissue nodule within the subcutaneous fat of the left ischioanal fossa. IMPRESSION: Ill-defined fluid collection compatible with large intramuscular hematoma within the left anterior thigh compartment musculature. Exam is not optimized to evaluate for active bleeding. If there is concern for active bleeding recommend CT angiogram of the left lower extremity. Soft tissue nodule within the left ischioanal fossa measuring 1.8 cm is indeterminate but may represent an additional hematoma. Electronically Signed   By: Placido Sou M.D.   On: 12/09/2021 01:28   DG Chest 2 View  Result Date: 12/08/2021 CLINICAL DATA:  Dyspnea EXAM: CHEST - 2 VIEW COMPARISON:  11/04/2021 FINDINGS: The heart size and mediastinal contours are within normal limits. Both lungs are clear. The visualized skeletal structures are unremarkable. Dorsal column stimulator leads are again noted, unchanged.  IMPRESSION: No active cardiopulmonary disease. Electronically Signed   By: Fidela Salisbury M.D.   On: 12/08/2021 22:00   DG Lumbar Spine Complete  Result Date: 12/01/2021 CLINICAL DATA:  Low back pain EXAM: LUMBAR SPINE - COMPLETE 4+ VIEW COMPARISON:  None Available. FINDINGS: 5 nonrib bearing lumbar-type vertebral bodies. Vertebral body heights are maintained. No acute fracture. No static listhesis. No spondylolysis. Posterior lumbar interbody fusion at L5-S1. Mild degenerative disease with disc height loss at L4-5. Insert until note made of a spinal stimulator. IVC filter with the tip at the level of L2. SI joints are unremarkable. IMPRESSION: 1. Posterior lumbar interbody fusion at L5-S1. 2. Mild degenerative disease with disc height loss at L4-5. Electronically Signed   By: Kathreen Devoid M.D.   On: 12/01/2021 12:22   VAS [place] LOWER EXTREMITY VENOUS (DVT) (7a-7p)  Result Date: 11/30/2021  Lower Venous DVT Study Patient Name:  Justin [name]  Date of Exam:   11/30/2021 Medical Rec #: [id number]       Accession #:    [id number] Date of Birth: 19-May-1965       Patient Gender: M Patient Age:   70 years Exam Location:  [facility] Procedure:      VAS [place] LOWER EXTREMITY VENOUS (DVT) Referring Phys: HALEY SAGE --------------------------------------------------------------------------------  Indications: Right lower extremity pain, history of calf DVT.  Anticoagulation: Coumadin. Comparison Study: 10-12-2021 Prior right lower extremity venous study was                   positive for acute peroneal DVT.  Follow up performed 10-27-2021. Performing Technologist: Darlin Coco RDMS, RVT  Examination Guidelines: A complete evaluation includes B-mode imaging, spectral Doppler, color Doppler, and power Doppler as needed of all accessible portions of each vessel. Bilateral testing is considered an integral part of a complete examination. Limited examinations for reoccurring indications may be performed  as noted. The reflux portion of the exam is performed with the patient in reverse Trendelenburg.  +---------+---------------+---------+-----------+----------+--------------+ RIGHT    CompressibilityPhasicitySpontaneityPropertiesThrombus Aging +---------+---------------+---------+-----------+----------+--------------+ CFV      Full           Yes      Yes                                 +---------+---------------+---------+-----------+----------+--------------+ SFJ      Full                                                        +---------+---------------+---------+-----------+----------+--------------+ FV Prox  Full                                                        +---------+---------------+---------+-----------+----------+--------------+ FV Mid   Full                                                        +---------+---------------+---------+-----------+----------+--------------+ FV DistalFull                                                        +---------+---------------+---------+-----------+----------+--------------+ PFV      Full                                                        +---------+---------------+---------+-----------+----------+--------------+ POP      Full           Yes      Yes                                 +---------+---------------+---------+-----------+----------+--------------+ PTV      Full                                                        +---------+---------------+---------+-----------+----------+--------------+ PERO     Full                                                        +---------+---------------+---------+-----------+----------+--------------+  Gastroc  Full                                                        +---------+---------------+---------+-----------+----------+--------------+   +----+---------------+---------+-----------+----------+--------------+  LEFTCompressibilityPhasicitySpontaneityPropertiesThrombus Aging +----+---------------+---------+-----------+----------+--------------+ CFV Full           Yes      Yes                                 +----+---------------+---------+-----------+----------+--------------+     Summary: RIGHT: - There is no evidence of deep vein thrombosis in the lower extremity.  - No cystic structure found in the popliteal fossa.  LEFT: - No evidence of common femoral vein obstruction.  *See table(s) above for measurements and observations. Electronically signed by Harold Barban MD on 11/30/2021 at 11:54:37 PM.    Final      Subjective: No acute issues or events overnight   Discharge Exam: Vitals:   12/10/21 0018 12/10/21 0439  BP: 112/76 124/77  Pulse: (!) 105 (!) 105  Resp: 18 19  Temp: 98.7 F (37.1 C) 98.1 F (36.7 C)  SpO2: 95% 95%   Vitals:   12/09/21 1424 12/09/21 1942 12/10/21 0018 12/10/21 0439  BP: 125/87 (!) 127/93 112/76 124/77  Pulse:  (!) 57 (!) 105 (!) 105  Resp: 18 18 18 19   Temp: 98.3 F (36.8 C) 98.7 F (37.1 C) 98.7 F (37.1 C) 98.1 F (36.7 C)  TempSrc: Oral Oral Oral Oral  SpO2:  100% 95% 95%  Weight:    99.8 kg  Height:        General: Pt is alert, awake, not in acute distress Cardiovascular: RRR, S1/S2 +, no rubs, no gallops Respiratory: CTA bilaterally, no wheezing, no rhonchi Abdominal: Soft, NT, ND, bowel sounds + Extremities: Left thigh tender to palpate    The results of significant diagnostics from this hospitalization (including imaging, microbiology, ancillary and laboratory) are listed below for reference.     Microbiology: No results found for this or any previous visit (from the past 240 hour(s)).   Labs: BNP (last 3 results) Recent Labs    03/19/21 1028 12/08/21 2007  BNP 59.0 0000000   Basic Metabolic Panel: Recent Labs  Lab 12/08/21 2007 12/09/21 0443  NA 138 138  K 3.9 3.7  CL 103 104  CO2 25 23  GLUCOSE 136* 145*  BUN 7 7   CREATININE 1.41* 1.29*  CALCIUM 8.6* 8.6*  MG  --  2.4  PHOS  --  3.2   Liver Function Tests: Recent Labs  Lab 12/09/21 0443  AST 37  ALT 34  ALKPHOS 77  BILITOT 0.6  PROT 6.5  ALBUMIN 3.6   No results for input(s): "LIPASE", "AMYLASE" in the last 168 hours. No results for input(s): "AMMONIA" in the last 168 hours. CBC: Recent Labs  Lab 12/08/21 2007 12/09/21 0443 12/10/21 0619  WBC 14.0* 12.9* 12.2*  NEUTROABS 10.0* 8.8*  --   HGB 10.3* 9.9* 9.7*  HCT 31.2* 30.0* 29.6*  MCV 85.7 86.7 86.5  PLT 291 290 295   Cardiac Enzymes: No results for input(s): "CKTOTAL", "CKMB", "CKMBINDEX", "TROPONINI" in the last 168 hours. BNP: Invalid input(s): "POCBNP" CBG: No results for input(s): "GLUCAP" in the last 168 hours. D-Dimer Recent Labs  12/08/21 2007  DDIMER <0.27   Hgb A1c No results for input(s): "HGBA1C" in the last 72 hours. Lipid Profile No results for input(s): "CHOL", "HDL", "LDLCALC", "TRIG", "CHOLHDL", "LDLDIRECT" in the last 72 hours. Thyroid function studies No results for input(s): "TSH", "T4TOTAL", "T3FREE", "THYROIDAB" in the last 72 hours.  Invalid input(s): "FREET3" Anemia work up No results for input(s): "VITAMINB12", "FOLATE", "FERRITIN", "TIBC", "IRON", "RETICCTPCT" in the last 72 hours. Urinalysis    Component Value Date/Time   COLORURINE STRAW (A) 09/06/2020 1629   APPEARANCEUR CLEAR 09/06/2020 1629   LABSPEC 1.005 09/06/2020 1629   PHURINE 6.0 09/06/2020 1629   GLUCOSEU NEGATIVE 09/06/2020 1629   HGBUR NEGATIVE 09/06/2020 1629   BILIRUBINUR NEGATIVE 09/06/2020 [place] 09/06/2020 1629   PROTEINUR NEGATIVE 09/06/2020 1629   UROBILINOGEN 0.2 04/04/2014 2056   NITRITE NEGATIVE 09/06/2020 1629   LEUKOCYTESUR NEGATIVE 09/06/2020 1629   Sepsis Labs Recent Labs  Lab 12/08/21 2007 12/09/21 0443 12/10/21 0619  WBC 14.0* 12.9* 12.2*   Microbiology No results found for this or any previous visit (from the past 240  hour(s)).   Time coordinating discharge: Over 30 minutes  SIGNED:   Little Ishikawa, DO [facility] 12/10/2021, 9:06 AM Pager   If 7PM-7AM, please contact night-coverage [email or web address]

## 2021-12-10 NOTE — Progress Notes (Signed)
Discharge teaching complete. Meds, diet, activity, follow up appointments reviewed and all questions answered. Copy of instructions given to patient. Patient taken out to car via wheelchair to car in emergency room parking lot.

## 2021-12-10 NOTE — TOC Transition Note (Addendum)
Transition of Care [facility]) - CM/SW Discharge Note   Patient Details  Name: Justin [name] MRN: [id number] Date of Birth: Jun 15, 1965  Transition of [facility] CM/SW Contact:  Carles Collet, RN Phone Number: 12/10/2021, 9:10 AM   Clinical Narrative:     Plan for patient to DC home. Provided with Eliquis 30 day card, refills covered through [facility]. RW ordered No other TOC needs identified.   Final next level of care: Home/Self Care Barriers to Discharge: No Barriers Identified   Patient Goals and CMS Choice        Discharge Placement                       Discharge Plan and Services                                     Social Determinants of Health (SDOH) Interventions     Readmission Risk Interventions     No data to display

## 2021-12-13 ENCOUNTER — Other Ambulatory Visit (HOSPITAL_COMMUNITY): Payer: Self-pay | Admitting: Neurosurgery

## 2021-12-13 DIAGNOSIS — M5416 Radiculopathy, lumbar region: Secondary | ICD-10-CM

## 2021-12-16 ENCOUNTER — Emergency Department (HOSPITAL_BASED_OUTPATIENT_CLINIC_OR_DEPARTMENT_OTHER): Payer: Medicaid Other

## 2021-12-16 ENCOUNTER — Emergency Department (HOSPITAL_COMMUNITY)
Admission: EM | Admit: 2021-12-16 | Discharge: 2021-12-17 | Disposition: A | Discharge status: Home / Self Care | Payer: Medicaid Other | Attending: Emergency Medicine | Admitting: Emergency Medicine

## 2021-12-16 ENCOUNTER — Other Ambulatory Visit: Payer: Self-pay

## 2021-12-16 DIAGNOSIS — D75839 Thrombocytosis, unspecified: Secondary | ICD-10-CM | POA: Insufficient documentation

## 2021-12-16 DIAGNOSIS — S7012XD Contusion of left thigh, subsequent encounter: Secondary | ICD-10-CM | POA: Diagnosis not present

## 2021-12-16 DIAGNOSIS — X58XXXD Exposure to other specified factors, subsequent encounter: Secondary | ICD-10-CM | POA: Insufficient documentation

## 2021-12-16 DIAGNOSIS — D649 Anemia, unspecified: Secondary | ICD-10-CM | POA: Insufficient documentation

## 2021-12-16 DIAGNOSIS — R7309 Other abnormal glucose: Secondary | ICD-10-CM | POA: Insufficient documentation

## 2021-12-16 DIAGNOSIS — Z7901 Long term (current) use of anticoagulants: Secondary | ICD-10-CM | POA: Insufficient documentation

## 2021-12-16 DIAGNOSIS — S8992XD Unspecified injury of left lower leg, subsequent encounter: Secondary | ICD-10-CM | POA: Diagnosis present

## 2021-12-16 DIAGNOSIS — R609 Edema, unspecified: Secondary | ICD-10-CM | POA: Diagnosis not present

## 2021-12-16 LAB — CBC WITH DIFFERENTIAL/PLATELET
Abs Immature Granulocytes: 0.38 10*3/uL — ABNORMAL HIGH (ref 0.00–0.07)
Basophils Absolute: 0.1 10*3/uL (ref 0.0–0.1)
Basophils Relative: 1 %
Eosinophils Absolute: 0.1 10*3/uL (ref 0.0–0.5)
Eosinophils Relative: 1 %
HCT: 29.8 % — ABNORMAL LOW (ref 39.0–52.0)
Hemoglobin: 9.7 g/dL — ABNORMAL LOW (ref 13.0–17.0)
Immature Granulocytes: 3 %
Lymphocytes Relative: 12 %
Lymphs Abs: 1.6 10*3/uL (ref 0.7–4.0)
MCH: 28.9 pg (ref 26.0–34.0)
MCHC: 32.6 g/dL (ref 30.0–36.0)
MCV: 88.7 fL (ref 80.0–100.0)
Monocytes Absolute: 0.8 10*3/uL (ref 0.1–1.0)
Monocytes Relative: 6 %
Neutro Abs: 10.4 10*3/uL — ABNORMAL HIGH (ref 1.7–7.7)
Neutrophils Relative %: 77 %
Platelets: 418 10*3/uL — ABNORMAL HIGH (ref 150–400)
RBC: 3.36 MIL/uL — ABNORMAL LOW (ref 4.22–5.81)
RDW: 16.3 % — ABNORMAL HIGH (ref 11.5–15.5)
WBC: 13.4 10*3/uL — ABNORMAL HIGH (ref 4.0–10.5)
nRBC: 1 % — ABNORMAL HIGH (ref 0.0–0.2)

## 2021-12-16 LAB — BASIC METABOLIC PANEL
Anion gap: 6 (ref 5–15)
BUN: <5 mg/dL — ABNORMAL LOW (ref 6–20)
CO2: 26 mmol/L (ref 22–32)
Calcium: 8.5 mg/dL — ABNORMAL LOW (ref 8.9–10.3)
Chloride: 106 mmol/L (ref 98–111)
Creatinine, Ser: 1.16 mg/dL (ref 0.61–1.24)
GFR, Estimated: >60 mL/min (ref >60)
Glucose, Bld: 139 mg/dL — ABNORMAL HIGH (ref 70–99)
Potassium: 3.8 mmol/L (ref 3.5–5.1)
Sodium: 138 mmol/L (ref 135–145)

## 2021-12-16 LAB — PROTIME-INR
INR: 1.1 (ref 0.8–1.2)
Prothrombin Time: 14.4 seconds (ref 11.4–15.2)

## 2021-12-16 MED ORDER — OXYCODONE HCL 7.5 MG PO TABS: 7.5000 mg | ORAL_TABLET | ORAL | 0 refills | Status: DC | PRN

## 2021-12-16 NOTE — ED Provider Notes (Signed)
[facility] EMERGENCY DEPARTMENT Provider Note   CSN: [id number] Arrival date & time: 12/16/21  1405     History  Chief Complaint  Patient presents with   Leg Pain    Justin [name] is a 56 y.o. male.  The history is provided by the patient.  Leg Pain He has history of pulmonary embolism anticoagulated on apixaban and comes in because of swelling of his right leg.  He had been in the hospital with a diagnosis of hematoma.  However, he has noted some swelling going down into his lower leg and is concerned about a recurrence of DVT.  He states that he is able to sleep at night, but when he gets up in the morning, it is very painful to start to put weight on his leg.  He has been taking hydrocodone-acetaminophen for pain with minimal relief.    Home Medications Prior to Admission medications   Medication Sig Start Date End Date Taking? Authorizing Provider  acetaminophen (TYLENOL) 500 MG tablet Take 1 tablet (500 mg total) by mouth every 6 (six) hours as needed. Patient taking differently: Take 500 mg by mouth every 6 (six) hours as needed for mild pain or headache. 08/28/19   Michela Pitcher A, PA-C  albuterol (VENTOLIN HFA) 108 (90 Base) MCG/ACT inhaler Inhale 1-2 puffs into the lungs every 6 (six) hours as needed for wheezing or shortness of breath. Patient not taking: Reported on 12/09/2021 03/21/21   Rhys Martini, PA-C  apixaban (ELIQUIS) 5 MG TABS tablet Take 1 tablet (5 mg total) by mouth 2 (two) times daily. 12/11/21   Azucena Fallen, MD  cyclobenzaprine (FLEXERIL) 10 MG tablet Take 1 tablet (10 mg total) by mouth 3 (three) times daily as needed for muscle spasms. 12/03/21   Redwine, Madison A, PA-C  dicyclomine (BENTYL) 10 MG capsule Take 10 mg by mouth 3 (three) times daily as needed for spasms. 11/27/21   [provider]  doxepin (SINEQUAN) 75 MG capsule Take 75 mg by mouth at bedtime. 12/14/19   [provider]  gabapentin (NEURONTIN) 300 MG  capsule Take 1 capsule (300 mg total) by mouth 3 (three) times daily for 14 days. 12/03/21 12/17/21  Redwine, Madison A, PA-C  HYDROcodone-acetaminophen (NORCO/VICODIN) 5-325 MG tablet Take 1 tablet by mouth 3 (three) times daily as needed. Patient taking differently: Take 1 tablet by mouth 3 (three) times daily as needed for moderate pain. 12/03/21   Redwine, Madison A, PA-C  lithium 600 MG capsule Take 600 mg by mouth at bedtime.    [provider]  mirtazapine (REMERON) 45 MG tablet Take 45 mg by mouth at bedtime.    [provider]  predniSONE (DELTASONE) 20 MG tablet Take 2 tablets (40 mg total) by mouth daily. 09/23/21   Benjiman Core, MD  QUEtiapine (SEROQUEL XR) 400 MG 24 hr tablet Take 800 mg by mouth at bedtime. 11/16/21   [provider]  tiZANidine (ZANAFLEX) 4 MG tablet Take 4 mg by mouth at bedtime.  12/17/19   [provider]      Allergies    Lyrica [pregabalin]    Review of Systems   Review of Systems  All other systems reviewed and are negative.   Physical Exam Updated Vital Signs BP (!) 144/99   Pulse 98   Temp 98.8 F (37.1 C) (Oral)   Resp 16   SpO2 99%  Physical Exam Vitals and nursing note reviewed.   56 year old  male, resting comfortably and in no acute distress. Vital signs are significant for elevated blood pressure. Oxygen saturation is 99%, which is normal. Head is normocephalic and atraumatic. PERRLA, EOMI. Oropharynx is clear. Neck is nontender and supple without adenopathy or JVD. Back is nontender and there is no CVA tenderness. Lungs are clear without rales, wheezes, or rhonchi. Chest is nontender. Heart has regular rate and rhythm without murmur. Abdomen is soft, flat, nontender. Extremities: Left thigh is swollen with induration and tenderness over the anterior and lateral aspect consistent with known hematoma.  There is also 1+ edema of the lower leg. Skin is warm and dry without rash. Neurologic: Mental  status is normal, cranial nerves are intact, moves all extremities equally.  ED Results / Procedures / Treatments   Labs (all labs ordered are listed, but only abnormal results are displayed) Labs Reviewed  CBC WITH DIFFERENTIAL/PLATELET - Abnormal; Notable for the following components:      Result Value   WBC 13.4 (*)    RBC 3.36 (*)    Hemoglobin 9.7 (*)    HCT 29.8 (*)    RDW 16.3 (*)    Platelets 418 (*)    nRBC 1.0 (*)    Neutro Abs 10.4 (*)    Abs Immature Granulocytes 0.38 (*)    All other components within normal limits  BASIC METABOLIC PANEL - Abnormal; Notable for the following components:   Glucose, Bld 139 (*)    BUN <5 (*)    Calcium 8.5 (*)    All other components within normal limits  PROTIME-INR   Radiology VAS [place] LOWER EXTREMITY VENOUS (DVT) (7a-7p)  Result Date: 12/16/2021  Lower Venous DVT Study Patient Name:  Justin [name]  Date of Exam:   12/16/2021 Medical Rec #: [id number]       Accession #:    [id number] Date of Birth: Jul 06, 1965       Patient Gender: M Patient Age:   64 years Exam Location:  [facility] Procedure:      VAS [place] LOWER EXTREMITY VENOUS (DVT) Referring Phys: AMJAD ALI --------------------------------------------------------------------------------  Indications: Edema.  Comparison Study: prior 11/30/21 Performing Technologist: Archie Patten RVS  Examination Guidelines: A complete evaluation includes B-mode imaging, spectral Doppler, color Doppler, and power Doppler as needed of all accessible portions of each vessel. Bilateral testing is considered an integral part of a complete examination. Limited examinations for reoccurring indications may be performed as noted. The reflux portion of the exam is performed with the patient in reverse Trendelenburg.  +-----+---------------+---------+-----------+----------+--------------+ RIGHTCompressibilityPhasicitySpontaneityPropertiesThrombus Aging  +-----+---------------+---------+-----------+----------+--------------+ CFV  Full           Yes      Yes                                 +-----+---------------+---------+-----------+----------+--------------+   +---------+---------------+---------+-----------+----------+--------------+ LEFT     CompressibilityPhasicitySpontaneityPropertiesThrombus Aging +---------+---------------+---------+-----------+----------+--------------+ CFV      Full           Yes      Yes                                 +---------+---------------+---------+-----------+----------+--------------+ SFJ      Full                                                        +---------+---------------+---------+-----------+----------+--------------+  FV Prox  Full                                                        +---------+---------------+---------+-----------+----------+--------------+ FV Mid   Full                                                        +---------+---------------+---------+-----------+----------+--------------+ FV DistalFull                                                        +---------+---------------+---------+-----------+----------+--------------+ PFV      Full                                                        +---------+---------------+---------+-----------+----------+--------------+ POP      Full           Yes      Yes                                 +---------+---------------+---------+-----------+----------+--------------+ PTV      Full                                                        +---------+---------------+---------+-----------+----------+--------------+ PERO     Full           Yes      Yes                                 +---------+---------------+---------+-----------+----------+--------------+     Summary: RIGHT: - No evidence of common femoral vein obstruction.  LEFT: - There is no evidence of deep vein thrombosis in the  lower extremity.  - No cystic structure found in the popliteal fossa.  *See table(s) above for measurements and observations.    Preliminary     Procedures Procedures    Medications Ordered in ED Medications - No data to display  ED Course/ Medical Decision Making/ A&P                           Medical Decision Making  Hematoma of the left thigh secondary to anticoagulation.  Left leg edema secondary to the hematoma.  I have reviewed and interpreted his laboratory test, and my interpretation is stable anemia, mild thrombocytosis, elevated random glucose.  Venous Doppler studies showed no evidence of DVT.  I have reviewed his old records, and he was admitted on 12/08/2021 for left thigh hematoma secondary to anticoagulation.  Patient was reassured that there is no evidence  of DVT, but also advised that hematoma of the size takes considerable amount of time to reabsorb.  Expect swelling in the interim.  I have ordered a prescription for oxycodone to see if this gives him better pain relief than hydrocodone.  Final Clinical Impression(s) / ED Diagnoses Final diagnoses:  Hematoma of left thigh, subsequent encounter  Chronic anticoagulation  Normochromic normocytic anemia  Thrombocytosis  Elevated random blood glucose level    Rx / DC Orders ED Discharge Orders          Ordered    oxyCODONE 7.5 MG TABS  Every 4 hours PRN        12/16/21 2354              Dione Booze, MD 12/17/21 0002

## 2021-12-16 NOTE — ED Provider Triage Note (Addendum)
Emergency Medicine Provider Triage Evaluation Note  Justin [name] , a 56 y.o. male  was evaluated in triage.  Pt complains of left leg pain.  He has known history of hematoma.  States he has been icing it however he has not had much improvement.  He was recently admitted for supratherapeutic INR, hematoma and discharged on 9/24.  He was switched to Eliquis.  He states is difficult for him to walk.  His skin is starting to get tighter.  He also has swelling to his left lower extremity.  Denies chest pain shortness of breath.  Review of Systems  Positive: As above Negative: As above  Physical Exam  BP (!) 135/93 (BP Location: Right Arm)   Pulse (!) 101   Temp 98.8 F (37.1 C) (Oral)   Resp 18   SpO2 100%  Gen:   Awake, no distress   Resp:  Normal effort  MSK:   Moves extremities without difficulty  Other:  Pitting edema to left lower extremity  Medical Decision Making  Medically screening exam initiated at 3:33 PM.  Appropriate orders placed.  Justin [name] was informed that the remainder of the evaluation will be completed by another provider, this initial triage assessment does not replace that evaluation, and the importance of remaining in the ED until their evaluation is complete.  We will obtain DVT study to left lower extremity.   Justin Courier, PA-C 12/16/21 [place], [place], [place] 12/16/21 1535

## 2021-12-16 NOTE — Discharge Instructions (Signed)
Your leg is swollen because there has been bleeding into the thigh.  This will take a considerable amount of time to resolve.  In the meantime, discuss with your primary care provider whether physical therapy may be helpful.  You have stated that hydrocodone is not giving you good pain relief.  I have prescribed a small number of oxycodone tablets to see if they help any more than the hydrocodone.

## 2021-12-16 NOTE — ED Triage Notes (Signed)
Pt here w/ L leg pain that has increased since being admitted last week for the same. Pt reports swelling to the leg, is on blood thinners. Pt in no acute distress, reports pain worse w/ ambulation.

## 2021-12-16 NOTE — Progress Notes (Signed)
Lower extremity venous has been completed.   Preliminary results in CV Proc.   Justin [name] 12/16/2021 4:04 PM

## 2021-12-21 ENCOUNTER — Encounter (HOSPITAL_COMMUNITY): Payer: Self-pay | Admitting: *Deleted

## 2021-12-21 ENCOUNTER — Other Ambulatory Visit: Payer: Self-pay

## 2021-12-21 ENCOUNTER — Ambulatory Visit (HOSPITAL_COMMUNITY)
Admission: EM | Admit: 2021-12-21 | Discharge: 2021-12-21 | Disposition: A | Discharge status: Home / Self Care | Payer: Medicaid Other | Attending: Family Medicine | Admitting: Family Medicine

## 2021-12-21 ENCOUNTER — Inpatient Hospital Stay (HOSPITAL_BASED_OUTPATIENT_CLINIC_OR_DEPARTMENT_OTHER)
Admission: EM | Admit: 2021-12-21 | Discharge: 2021-12-25 | DRG: 558 | Disposition: A | Discharge status: Home / Self Care | Payer: Medicaid Other | Attending: Internal Medicine | Admitting: Internal Medicine

## 2021-12-21 ENCOUNTER — Encounter (HOSPITAL_BASED_OUTPATIENT_CLINIC_OR_DEPARTMENT_OTHER): Payer: Self-pay

## 2021-12-21 ENCOUNTER — Emergency Department (HOSPITAL_BASED_OUTPATIENT_CLINIC_OR_DEPARTMENT_OTHER): Payer: Medicaid Other

## 2021-12-21 DIAGNOSIS — Z820 Family history of epilepsy and other diseases of the nervous system: Secondary | ICD-10-CM

## 2021-12-21 DIAGNOSIS — Z86718 Personal history of other venous thrombosis and embolism: Secondary | ICD-10-CM

## 2021-12-21 DIAGNOSIS — D649 Anemia, unspecified: Secondary | ICD-10-CM | POA: Diagnosis present

## 2021-12-21 DIAGNOSIS — M549 Dorsalgia, unspecified: Secondary | ICD-10-CM | POA: Diagnosis present

## 2021-12-21 DIAGNOSIS — Z823 Family history of stroke: Secondary | ICD-10-CM

## 2021-12-21 DIAGNOSIS — M7989 Other specified soft tissue disorders: Secondary | ICD-10-CM | POA: Diagnosis not present

## 2021-12-21 DIAGNOSIS — Z888 Allergy status to other drugs, medicaments and biological substances status: Secondary | ICD-10-CM

## 2021-12-21 DIAGNOSIS — G4733 Obstructive sleep apnea (adult) (pediatric): Secondary | ICD-10-CM | POA: Diagnosis present

## 2021-12-21 DIAGNOSIS — G8929 Other chronic pain: Secondary | ICD-10-CM | POA: Diagnosis present

## 2021-12-21 DIAGNOSIS — Z8279 Family history of other congenital malformations, deformations and chromosomal abnormalities: Secondary | ICD-10-CM

## 2021-12-21 DIAGNOSIS — Z86711 Personal history of pulmonary embolism: Secondary | ICD-10-CM

## 2021-12-21 DIAGNOSIS — M609 Myositis, unspecified: Secondary | ICD-10-CM

## 2021-12-21 DIAGNOSIS — Z9682 Presence of neurostimulator: Secondary | ICD-10-CM

## 2021-12-21 DIAGNOSIS — Z8249 Family history of ischemic heart disease and other diseases of the circulatory system: Secondary | ICD-10-CM

## 2021-12-21 DIAGNOSIS — Z981 Arthrodesis status: Secondary | ICD-10-CM

## 2021-12-21 DIAGNOSIS — Z7901 Long term (current) use of anticoagulants: Secondary | ICD-10-CM

## 2021-12-21 DIAGNOSIS — Z79899 Other long term (current) drug therapy: Secondary | ICD-10-CM

## 2021-12-21 DIAGNOSIS — S7012XA Contusion of left thigh, initial encounter: Secondary | ICD-10-CM

## 2021-12-21 DIAGNOSIS — R6510 Systemic inflammatory response syndrome (SIRS) of non-infectious origin without acute organ dysfunction: Secondary | ICD-10-CM | POA: Diagnosis present

## 2021-12-21 DIAGNOSIS — M60052 Infective myositis, left thigh: Principal | ICD-10-CM | POA: Diagnosis present

## 2021-12-21 DIAGNOSIS — F319 Bipolar disorder, unspecified: Secondary | ICD-10-CM | POA: Diagnosis present

## 2021-12-21 DIAGNOSIS — M60009 Infective myositis, unspecified site: Secondary | ICD-10-CM | POA: Diagnosis present

## 2021-12-21 DIAGNOSIS — D72829 Elevated white blood cell count, unspecified: Secondary | ICD-10-CM | POA: Diagnosis present

## 2021-12-21 LAB — BASIC METABOLIC PANEL
Anion gap: 7 (ref 5–15)
BUN: 9 mg/dL (ref 6–20)
CO2: 28 mmol/L (ref 22–32)
Calcium: 8.9 mg/dL (ref 8.9–10.3)
Chloride: 106 mmol/L (ref 98–111)
Creatinine, Ser: 1.05 mg/dL (ref 0.61–1.24)
GFR, Estimated: >60 mL/min (ref >60)
Glucose, Bld: 136 mg/dL — ABNORMAL HIGH (ref 70–99)
Potassium: 4 mmol/L (ref 3.5–5.1)
Sodium: 141 mmol/L (ref 135–145)

## 2021-12-21 LAB — CBC WITH DIFFERENTIAL/PLATELET
Abs Immature Granulocytes: 0.13 10*3/uL — ABNORMAL HIGH (ref 0.00–0.07)
Basophils Absolute: 0 10*3/uL (ref 0.0–0.1)
Basophils Relative: 0 %
Eosinophils Absolute: 0.1 10*3/uL (ref 0.0–0.5)
Eosinophils Relative: 1 %
HCT: 29.9 % — ABNORMAL LOW (ref 39.0–52.0)
Hemoglobin: 9.7 g/dL — ABNORMAL LOW (ref 13.0–17.0)
Immature Granulocytes: 1 %
Lymphocytes Relative: 16 %
Lymphs Abs: 1.9 10*3/uL (ref 0.7–4.0)
MCH: 29 pg (ref 26.0–34.0)
MCHC: 32.4 g/dL (ref 30.0–36.0)
MCV: 89.3 fL (ref 80.0–100.0)
Monocytes Absolute: 0.9 10*3/uL (ref 0.1–1.0)
Monocytes Relative: 8 %
Neutro Abs: 8.9 10*3/uL — ABNORMAL HIGH (ref 1.7–7.7)
Neutrophils Relative %: 74 %
Platelets: 395 10*3/uL (ref 150–400)
RBC: 3.35 MIL/uL — ABNORMAL LOW (ref 4.22–5.81)
RDW: 18.2 % — ABNORMAL HIGH (ref 11.5–15.5)
WBC: 11.9 10*3/uL — ABNORMAL HIGH (ref 4.0–10.5)
nRBC: 0.3 % — ABNORMAL HIGH (ref 0.0–0.2)

## 2021-12-21 LAB — PROTIME-INR
INR: 1.1 (ref 0.8–1.2)
Prothrombin Time: 14.4 seconds (ref 11.4–15.2)

## 2021-12-21 MED ORDER — KETOROLAC TROMETHAMINE 15 MG/ML IJ SOLN
15.0000 mg | Freq: Once | INTRAMUSCULAR | Status: AC
  Administered 2021-12-21: 15 mg via INTRAVENOUS
  Filled 2021-12-21: qty 1

## 2021-12-21 MED ORDER — OXYCODONE-ACETAMINOPHEN 5-325 MG PO TABS
2.0000 | ORAL_TABLET | Freq: Once | ORAL | Status: DC
  Filled 2021-12-21: qty 2

## 2021-12-21 MED ORDER — IOHEXOL 350 MG/ML SOLN
100.0000 mL | Freq: Once | INTRAVENOUS | Status: AC | PRN
  Administered 2021-12-21: 80 mL via INTRAVENOUS

## 2021-12-21 NOTE — ED Triage Notes (Signed)
Patient here POV from UC.  Endorses Swelling to Left Leg for approximately 2.5 Weeks. Seen at ED approximately 1 Week ago for his Back and was Admitted due to his Clotting but states his Leg Swelling was not addressed. Originally on Coumadin and is now on Eliquis.   NAD Noted during Triage. A&Ox4. GCS 15. Ambulatory but Painful.

## 2021-12-21 NOTE — Discharge Instructions (Addendum)
Please proceed to the emergency room for higher level of care and evaluation then we can provide for you in the urgent care setting

## 2021-12-21 NOTE — ED Notes (Signed)
Patient is being discharged from the Urgent Care and sent to the Emergency Department via POV . Per Juliane Poot, MD, patient is in need of higher level of care due to higher level of care needed. Patient is aware and verbalizes understanding of plan of care.  Vitals:   12/21/21 1929  BP: (!) 137/102  Pulse: (!) 103  Resp: 18  Temp: 98.2 F (36.8 C)  SpO2: 98%

## 2021-12-21 NOTE — ED Provider Notes (Addendum)
[place]    CSN: [id number] Arrival date & time: 12/21/21  1815      History   Chief Complaint Chief Complaint  Patient presents with   Leg Swelling    HPI Justin [name] is a 56 y.o. male.   HPI Here for pain and swelling in his left leg. He was admitted for a large spontaneous hematoma 9/23, most likely due to supratherapeutic INR. Seen again 9/30 for pain and swelling in that leg. No DVT on venous doppler on 9/30. Rx'd oxycodone, sent electronically, but I do not see it filled on pmp. He states it was expensive and he did not pick it up.   He states the upper left leg is hard and painful, feeling worse.  Swelling is now extending up into his inguinal area and down into his ankle.  When I asked him if he has fever he says maybe a little bit.  He is compliant with Eliquis that he is on now.      Past Medical History:  Diagnosis Date   Achilles rupture, left    Bipolar 1 disorder (HCC)    Dental caries    periodontitis   Pneumonia    Pulmonary embolism ([place]) 05/18/2007   Sleep apnea    wears CPAP   Subdural hematoma ([place]) 01/04/2013   in setting of supratherapeutic INR    Patient Active Problem List   Diagnosis Date Noted   Hematoma 12/09/2021   Acute deep vein thrombosis (DVT) of right peroneal vein ([place]) 10/19/2021   History of pulmonary embolism 10/19/2021   Status post lumbar spinal fusion 01/03/2018   Lumbar stenosis 12/23/2017   Spondylolisthesis, lumbar region 06/25/2017   Achilles tendinitis of left lower extremity 12/07/2016   Achilles tendinitis, left leg 05/17/2016   Obstructive sleep apnea on CPAP 03/07/2015   Post-operative state 03/07/2015   Achilles rupture, left 10/21/2014   OSA (obstructive sleep apnea) 04/23/2013   Bipolar disorder, unspecified ([name]) 01/07/2013   Sinus tachycardia 01/07/2013   Headache(784.0) 01/07/2013   SDH (subdural hematoma) ([name]) 01/04/2013   H/O PE 01/04/2013   Chronic anticoagulation 01/04/2013    Warfarin-induced coagulopathy ([place]) 01/04/2013   Acute sinusitis 01/04/2013    Past Surgical History:  Procedure Laterality Date   ACHILLES TENDON SURGERY Left 10/21/2014   Procedure: Left Achilles Reconstruction;  Surgeon: Newt Minion, MD;  Location: [place];  Service: Orthopedics;  Laterality: Left;   APPENDECTOMY     CARDIAC CATHETERIZATION  03/04/2018   [facility]: Normal coronaries, LVEF estimated at 40%, medical Rx   CRANIOTOMY N/A 01/19/2013   Procedure: CRANIOTOMY HEMATOMA EVACUATION SUBDURAL;  Surgeon: Hosie Spangle, MD;  Location: [facility];  Service: Neurosurgery;  Laterality: N/A;   CYSTECTOMY     right head   ELBOW SURGERY     right   FRACTURE SURGERY     finger   I & D EXTREMITY Left 12/07/2016   Procedure: LEFT ACHILLES DEBRIDEMENT;  Surgeon: Newt Minion, MD;  Location: [facility];  Service: Orthopedics;  Laterality: Left;   LUMBAR FUSION  12/23/2017   L5 GILL PROCEDURE, RIGHT L5-S1, TRANSFORAMIAL LUMBAR INTERBODY FUSION, BILATERAL LATERAL FUSION, PEDICLE INSTRUMENTATION   MULTIPLE EXTRACTIONS WITH ALVEOLOPLASTY N/A 03/07/2015   Procedure: MULTIPLE EXTRACTION WITH ALVEOLOPLASTY;  Surgeon: Diona Browner, DDS;  Location: [place];  Service: Oral Surgery;  Laterality: N/A;   SPINAL CORD STIMULATOR INSERTION N/A 05/18/2019   Procedure: LUMBAR SPINAL CORD STIMULATOR INSERTION;  Surgeon: Clydell Hakim, MD;  Location: [facility];  Service: Neurosurgery;  Laterality: N/A;  Thoracic/Lumbar   SPINAL CORD STIMULATOR INSERTION N/A 04/06/2020   Procedure: Revision of spinal cord stimulator;  Surgeon: Renaldo Fiddler, MD;  Location: [facility] OR;  Service: Neurosurgery;  Laterality: N/A;       Home Medications    Prior to Admission medications   Medication Sig Start Date End Date Taking? Authorizing Provider  acetaminophen (TYLENOL) 500 MG tablet Take 1 tablet (500 mg total) by mouth every 6 (six) hours as needed. Patient taking differently: Take 500 mg by mouth every 6 (six) hours as  needed for mild pain or headache. 08/28/19  Yes Fawze, Mina A, PA-C  apixaban (ELIQUIS) 5 MG TABS tablet Take 1 tablet (5 mg total) by mouth 2 (two) times daily. 12/11/21  Yes Azucena Fallen, MD  cyclobenzaprine (FLEXERIL) 10 MG tablet Take 1 tablet (10 mg total) by mouth 3 (three) times daily as needed for muscle spasms. 12/03/21  Yes Redwine, Madison A, PA-C  dicyclomine (BENTYL) 10 MG capsule Take 10 mg by mouth 3 (three) times daily as needed for spasms. 11/27/21  Yes [provider]  doxepin (SINEQUAN) 75 MG capsule Take 75 mg by mouth at bedtime. 12/14/19  Yes [provider]  gabapentin (NEURONTIN) 300 MG capsule Take 1 capsule (300 mg total) by mouth 3 (three) times daily for 14 days. 12/03/21 12/21/21 Yes Redwine, Madison A, PA-C  HYDROcodone-acetaminophen (NORCO) 7.5-325 MG tablet SMARTSIG:0.5-1 Tablet(s) By Mouth 1-2 Times Daily 12/14/21  Yes [provider]  HYDROcodone-acetaminophen (NORCO/VICODIN) 5-325 MG tablet Take 1 tablet by mouth 3 (three) times daily as needed. Patient taking differently: Take 1 tablet by mouth 3 (three) times daily as needed for moderate pain. 12/03/21  Yes Redwine, Madison A, PA-C  lithium 600 MG capsule Take 600 mg by mouth at bedtime.   Yes [provider]  mirtazapine (REMERON) 45 MG tablet Take 45 mg by mouth at bedtime.   Yes [provider]  oxyCODONE 7.5 MG TABS Take 7.5 mg by mouth every 4 (four) hours as needed for severe pain. 12/16/21  Yes Dione Booze, MD  QUEtiapine (SEROQUEL XR) 400 MG 24 hr tablet Take 800 mg by mouth at bedtime. 11/16/21  Yes [provider]  tiZANidine (ZANAFLEX) 4 MG tablet Take 4 mg by mouth at bedtime.  12/17/19  Yes [provider]  albuterol (VENTOLIN HFA) 108 (90 Base) MCG/ACT inhaler Inhale 1-2 puffs into the lungs every 6 (six) hours as needed for wheezing or shortness of breath. Patient not taking: Reported on 12/09/2021 03/21/21   Rhys Martini, PA-C    Family  History Family History  Problem Relation Age of Onset   Hypertension Mother    Stroke Mother    Multiple sclerosis Sister    Down syndrome Son     Social History Social History   Tobacco Use   Smoking status: Never   Smokeless tobacco: Never  Vaping Use   Vaping Use: Never used  Substance Use Topics   Alcohol use: No   Drug use: No     Allergies   Lyrica [pregabalin]   Review of Systems Review of Systems   Physical Exam Triage Vital Signs ED Triage Vitals  Enc Vitals Group     BP 12/21/21 1929 (!) 137/102     Pulse Rate 12/21/21 1929 (!) 103     Resp 12/21/21 1929 18     Temp 12/21/21 1929 98.2 F (36.8 C)  Temp Source 12/21/21 1929 Oral     SpO2 12/21/21 1929 98 %     Weight --      Height --      Head Circumference --      Peak Flow --      Pain Score 12/21/21 1925 10     Pain Loc --      Pain Edu? --      Excl. in GC? --    No data found.  Updated Vital Signs BP (!) 137/102 (BP Location: Right Arm)   Pulse (!) 103   Temp 98.2 F (36.8 C) (Oral)   Resp 18   SpO2 98%   Visual Acuity Right Eye Distance:   Left Eye Distance:   Bilateral Distance:    Right Eye Near:   Left Eye Near:    Bilateral Near:     Physical Exam Vitals reviewed.  Constitutional:      General: He is not in acute distress.    Appearance: He is not ill-appearing, toxic-appearing or diaphoretic.  Musculoskeletal:     Comments: There is a firm tender swelling of the thigh.  There is a little bit of swelling of the ankle.  Neurological:     Mental Status: He is alert and oriented to person, place, and time.  Psychiatric:        Behavior: Behavior normal.      UC Treatments / Results  Labs (all labs ordered are listed, but only abnormal results are displayed) Labs Reviewed - No data to display  EKG   Radiology No results found.  Procedures Procedures (including critical care time)  Medications Ordered in UC Medications - No data to display  Initial  Impression / Assessment and Plan / UC Course  I have reviewed the triage vital signs and the nursing notes.  Pertinent labs & imaging results that were available during my care of the patient were reviewed by me and considered in my medical decision making (see chart for details).         Due to the increased swelling and increased pain in his leg, I feel that he needs urgent evaluation in the emergency room where they can provide a higher level of care and more urgent evaluation then we can here in the urgent care setting.  He is going to proceed to the emergency room  Vital signs are stable for him.  He will go by private car to the emergency room  Final diagnoses:  None   Discharge Instructions   None    ED Prescriptions   None    I have reviewed the PDMP during this encounter.   Zenia Resides, MD 12/21/21 1950    Zenia Resides, MD 12/21/21 [phone]

## 2021-12-21 NOTE — ED Triage Notes (Signed)
Pt states that his legs are still swelling but worse the upper left leg is hard and painful. He was seen at [facility] on 12/16/21. He said the pain radiates to his groin. He is taking the flexeril, he was given pain rx from ER and he didn't pick it up due to cost and his insurance needed a PA. However he does say he is taking the hydrocodone 7.5 but its too weak for him he needs stronger pain meds.

## 2021-12-21 NOTE — ED Provider Notes (Signed)
MEDCENTER [facility] EMERGENCY DEPT Provider Note  CSN: [id number] Arrival date & time: 12/21/21 2017  Chief Complaint(s) Leg Swelling  HPI Justin [name] is a 56 y.o. 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  The patient was recently admitted in the setting of left thigh hematoma.  He had a supratherapeutic INR and his Coumadin was discontinued, he was transitioned to Eliquis.  He reports since discharge his left thigh has continued to get swollen.  He reports tenseness and pain.  He has been able to ambulate.  No chest pain, shortness of breath.  No fevers or chills.  Symptoms are constant.  He has been compliant with his Eliquis.   Past Medical History Past Medical History:  Diagnosis Date   Achilles rupture, left    Bipolar 1 disorder (HCC)    Dental caries    periodontitis   Pneumonia    Pulmonary embolism (HCC) 05/18/2007   Sleep apnea    wears CPAP   Subdural hematoma (HCC) 01/04/2013   in setting of supratherapeutic INR   Patient Active Problem List   Diagnosis Date Noted   Hematoma 12/09/2021   Acute deep vein thrombosis (DVT) of right peroneal vein (HCC) 10/19/2021   History of pulmonary embolism 10/19/2021   Status post lumbar spinal fusion 01/03/2018   Lumbar stenosis 12/23/2017   Spondylolisthesis, lumbar region 06/25/2017   Achilles tendinitis of left lower extremity 12/07/2016   Achilles tendinitis, left leg 05/17/2016   Obstructive sleep apnea on CPAP 03/07/2015   Post-operative state 03/07/2015   Achilles rupture, left 10/21/2014   OSA (obstructive sleep apnea) 04/23/2013   Bipolar disorder, unspecified (HCC) 01/07/2013   Sinus tachycardia 01/07/2013   Headache(784.0) 01/07/2013   SDH (subdural hematoma) (HCC) 01/04/2013   H/O PE 01/04/2013   Chronic anticoagulation 01/04/2013   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   Acute sinusitis  01/04/2013   Home Medication(s) Prior to Admission medications   Medication Sig Start Date End Date Taking? Authorizing Provider  acetaminophen (TYLENOL) 500 MG tablet Take 1 tablet (500 mg total) by mouth every 6 (six) hours as needed. Patient taking differently: Take 500 mg by mouth every 6 (six) hours as needed for mild pain or headache. 08/28/19   Michela Pitcher A, PA-C  albuterol (VENTOLIN HFA) 108 (90 Base) MCG/ACT inhaler Inhale 1-2 puffs into the lungs every 6 (six) hours as needed for wheezing or shortness of breath. Patient not taking: Reported on 12/09/2021 03/21/21   Rhys Martini, PA-C  apixaban (ELIQUIS) 5 MG TABS tablet Take 1 tablet (5 mg total) by mouth 2 (two) times daily. 12/11/21   Azucena Fallen, MD  cyclobenzaprine (FLEXERIL) 10 MG tablet Take 1 tablet (10 mg total) by mouth 3 (three) times daily as needed for muscle spasms. 12/03/21   Redwine, Madison A, PA-C  dicyclomine (BENTYL) 10 MG capsule Take 10 mg by mouth 3 (three) times daily as needed for spasms. 11/27/21   [provider]  doxepin (SINEQUAN) 75 MG capsule Take 75 mg by mouth at bedtime. 12/14/19   [provider]  gabapentin (NEURONTIN) 300 MG capsule Take 1 capsule (300 mg total) by mouth 3 (three) times daily for 14 days. 12/03/21 12/21/21  Redwine, Madison A, PA-C  HYDROcodone-acetaminophen (NORCO) 7.5-325 MG tablet SMARTSIG:0.5-1 Tablet(s) By Mouth 1-2 Times Daily 12/14/21   [provider]  HYDROcodone-acetaminophen (NORCO/VICODIN) 5-325 MG tablet Take 1 tablet by mouth 3 (  three) times daily as needed. Patient taking differently: Take 1 tablet by mouth 3 (three) times daily as needed for moderate pain. 12/03/21   Redwine, Madison A, PA-C  lithium 600 MG capsule Take 600 mg by mouth at bedtime.    [provider]  mirtazapine (REMERON) 45 MG tablet Take 45 mg by mouth at bedtime.    [provider]  oxyCODONE 7.5 MG TABS Take 7.5 mg by mouth every 4 (four) hours as needed  for severe pain. 08/04/82   Delora Fuel, MD  QUEtiapine (SEROQUEL XR) 400 MG 24 hr tablet Take 800 mg by mouth at bedtime. 11/16/21   [provider]  tiZANidine (ZANAFLEX) 4 MG tablet Take 4 mg by mouth at bedtime.  12/17/19   [provider]                                                                                                                                    Past Surgical History Past Surgical History:  Procedure Laterality Date   ACHILLES TENDON SURGERY Left 10/21/2014   Procedure: Left Achilles Reconstruction;  Surgeon: Newt Minion, MD;  Location: [place];  Service: Orthopedics;  Laterality: Left;   APPENDECTOMY     CARDIAC CATHETERIZATION  03/04/2018   [facility]: Normal coronaries, LVEF estimated at 40%, medical Rx   CRANIOTOMY N/A 01/19/2013   Procedure: CRANIOTOMY HEMATOMA EVACUATION SUBDURAL;  Surgeon: Hosie Spangle, MD;  Location: [place] [facility];  Service: Neurosurgery;  Laterality: N/A;   CYSTECTOMY     right head   ELBOW SURGERY     right   FRACTURE SURGERY     finger   I & D EXTREMITY Left 12/07/2016   Procedure: LEFT ACHILLES DEBRIDEMENT;  Surgeon: Newt Minion, MD;  Location: [place];  Service: Orthopedics;  Laterality: Left;   LUMBAR FUSION  12/23/2017   L5 GILL PROCEDURE, RIGHT L5-S1, TRANSFORAMIAL LUMBAR INTERBODY FUSION, BILATERAL LATERAL FUSION, PEDICLE INSTRUMENTATION   MULTIPLE EXTRACTIONS WITH ALVEOLOPLASTY N/A 03/07/2015   Procedure: MULTIPLE EXTRACTION WITH ALVEOLOPLASTY;  Surgeon: Diona Browner, DDS;  Location: [place];  Service: Oral Surgery;  Laterality: N/A;   SPINAL CORD STIMULATOR INSERTION N/A 05/18/2019   Procedure: LUMBAR SPINAL CORD STIMULATOR INSERTION;  Surgeon: Clydell Hakim, MD;  Location: [place];  Service: Neurosurgery;  Laterality: N/A;  Thoracic/Lumbar   SPINAL CORD STIMULATOR INSERTION N/A 04/06/2020   Procedure: Revision of spinal cord stimulator;  Surgeon: Reece Agar, MD;  Location: [place];  Service: Neurosurgery;   Laterality: N/A;   Family History Family History  Problem Relation Age of Onset   Hypertension Mother    Stroke Mother    Multiple sclerosis Sister    Down syndrome Son     Social History Social History   Tobacco Use   Smoking status: Never   Smokeless tobacco: Never  Vaping Use   Vaping Use: Never used  Substance Use Topics  Alcohol use: No   Drug use: No   Allergies Lyrica [pregabalin]  Review of Systems Review of Systems  All other systems reviewed and are negative.   Physical Exam Vital Signs  I have reviewed the triage vital signs BP (!) 138/100   Pulse 98   Temp 97.8 F (36.6 C) (Temporal)   Resp (!) 21   Ht 5\' 9"  (1.753 m)   Wt 99.8 kg   SpO2 100%   BMI 32.49 kg/m  Physical Exam Vitals and nursing note reviewed.  Constitutional:      General: He is not in acute distress.    Appearance: Normal appearance.  HENT:     Mouth/Throat:     Mouth: Mucous membranes are moist.  Eyes:     Conjunctiva/sclera: Conjunctivae normal.  Cardiovascular:     Rate and Rhythm: Normal rate and regular rhythm.  Pulmonary:     Effort: Pulmonary effort is normal. No respiratory distress.     Breath sounds: Normal breath sounds.  Abdominal:     General: Abdomen is flat.     Palpations: Abdomen is soft.     Tenderness: There is no abdominal tenderness.  Musculoskeletal:     Comments: Firmness and swelling to the lateral left thigh with some edema extending down the leg.  2+ DP pulses bilaterally.  No neurologic deficit in the bilateral lower extremities.  Skin:    General: Skin is warm and dry.     Capillary Refill: Capillary refill takes less than 2 seconds.  Neurological:     Mental Status: He is alert and oriented to person, place, and time. Mental status is at baseline.  Psychiatric:        Mood and Affect: Mood normal.        Behavior: Behavior normal.     ED Results and Treatments Labs (all labs ordered are listed, but only abnormal results are  displayed) Labs Reviewed  CBC WITH DIFFERENTIAL/PLATELET - Abnormal; Notable for the following components:      Result Value   WBC 11.9 (*)    RBC 3.35 (*)    Hemoglobin 9.7 (*)    HCT 29.9 (*)    RDW 18.2 (*)    nRBC 0.3 (*)    Neutro Abs 8.9 (*)    Abs Immature Granulocytes 0.13 (*)    All other components within normal limits  PROTIME-INR  BASIC METABOLIC PANEL                                                                                                                          Radiology No results found.  Pertinent labs & imaging results that were available during my care of the patient were reviewed by me and considered in my medical decision making (see MDM for details).  Medications Ordered in ED Medications  ketorolac (TORADOL) 15 MG/ML injection 15 mg (15 mg Intravenous Given 12/21/21 2250)  Procedures Procedures  (including critical care time)  Medical Decision Making / ED Course   MDM:  56 year old male presenting to the emergency department with leg pain.  On exam, he does have swelling to the left thigh.  Low concern for compartment syndrome.  He has good pulses and has no neurologic deficit.  Suspect pain ongoing from hematoma.  Unclear if hematoma has grown, will obtain CT to further evaluate extent.  We will also obtain CT scan to evaluate for underlying infection.  We will also obtain CT angiography to evaluate whether he has any ongoing active bleeding but lower concern for this.  He reports that his pain is poorly controlled at home.  He reports he has been elevating his leg and taking oxycodone as prescribed.  Clinical Course as of 12/21/21 2317  Thu Dec 21, 2021  2315 Signed out pending imaging of lower extremity to evaluate for change in size or superinfection of known left thigh hematoma [WS]    Clinical Course User  Index [WS] Lonell Grandchild, MD     Additional history obtained: -Additional history obtained from family -External records from outside source obtained and reviewed including: Chart review including previous notes, labs, imaging, consultation notes   Lab Tests: -I ordered, reviewed, and interpreted labs.   The pertinent results include:   Labs Reviewed  CBC WITH DIFFERENTIAL/PLATELET - Abnormal; Notable for the following components:      Result Value   WBC 11.9 (*)    RBC 3.35 (*)    Hemoglobin 9.7 (*)    HCT 29.9 (*)    RDW 18.2 (*)    nRBC 0.3 (*)    Neutro Abs 8.9 (*)    Abs Immature Granulocytes 0.13 (*)    All other components within normal limits  PROTIME-INR  BASIC METABOLIC PANEL      EKG   EKG Interpretation  Date/Time:    Ventricular Rate:    PR Interval:    QRS Duration:   QT Interval:    QTC Calculation:   R Axis:     Text Interpretation:            Medicines ordered and prescription drug management: Meds ordered this encounter  Medications   DISCONTD: oxyCODONE-acetaminophen (PERCOCET/ROXICET) 5-325 MG per tablet 2 tablet   ketorolac (TORADOL) 15 MG/ML injection 15 mg    -I have reviewed the patients home medicines and have made adjustments as needed   Reevaluation: After the interventions noted above, I reevaluated the patient and found that they have improved  Co morbidities that complicate the patient evaluation  Past Medical History:  Diagnosis Date   Achilles rupture, left    Bipolar 1 disorder (HCC)    Dental caries    periodontitis   Pneumonia    Pulmonary embolism (HCC) 05/18/2007   Sleep apnea    wears CPAP   Subdural hematoma (HCC) 01/04/2013   in setting of supratherapeutic INR      Dispostion: Pending at sign out    Final Clinical Impression(s) / ED Diagnoses Final diagnoses:  Hematoma of left thigh, initial encounter     This chart was dictated using voice recognition software.  Despite best efforts  to proofread,  errors can occur which can change the documentation meaning.    Lonell Grandchild, MD 12/21/21 2317

## 2021-12-21 NOTE — ED Notes (Signed)
Patient is here due to an increasing hematoma in the left leg x 1.5 weeks.  He now has a new area and pain in the left foot.  Patient was seen at the hospital for same and changed to eloquix.  Patient states he has also had some shortness of breath that is new with activity.  Patient left leg is swollen with hematoma from the thigh to the lower leg.  Bil dorsal ped pulses are strong.  Patient states the right foot is burning when he puts weight on the foot.

## 2021-12-22 ENCOUNTER — Encounter (HOSPITAL_COMMUNITY): Payer: Self-pay

## 2021-12-22 ENCOUNTER — Encounter (HOSPITAL_COMMUNITY): Payer: Self-pay | Admitting: Family Medicine

## 2021-12-22 DIAGNOSIS — M60009 Infective myositis, unspecified site: Secondary | ICD-10-CM | POA: Diagnosis not present

## 2021-12-22 DIAGNOSIS — Z823 Family history of stroke: Secondary | ICD-10-CM | POA: Diagnosis not present

## 2021-12-22 DIAGNOSIS — Z8249 Family history of ischemic heart disease and other diseases of the circulatory system: Secondary | ICD-10-CM | POA: Diagnosis not present

## 2021-12-22 DIAGNOSIS — D649 Anemia, unspecified: Secondary | ICD-10-CM | POA: Diagnosis not present

## 2021-12-22 DIAGNOSIS — Z86718 Personal history of other venous thrombosis and embolism: Secondary | ICD-10-CM | POA: Diagnosis not present

## 2021-12-22 DIAGNOSIS — F319 Bipolar disorder, unspecified: Secondary | ICD-10-CM

## 2021-12-22 DIAGNOSIS — M549 Dorsalgia, unspecified: Secondary | ICD-10-CM | POA: Diagnosis not present

## 2021-12-22 DIAGNOSIS — R6510 Systemic inflammatory response syndrome (SIRS) of non-infectious origin without acute organ dysfunction: Secondary | ICD-10-CM | POA: Diagnosis present

## 2021-12-22 DIAGNOSIS — Z8279 Family history of other congenital malformations, deformations and chromosomal abnormalities: Secondary | ICD-10-CM | POA: Diagnosis not present

## 2021-12-22 DIAGNOSIS — Z79899 Other long term (current) drug therapy: Secondary | ICD-10-CM | POA: Diagnosis not present

## 2021-12-22 DIAGNOSIS — G4733 Obstructive sleep apnea (adult) (pediatric): Secondary | ICD-10-CM | POA: Diagnosis not present

## 2021-12-22 DIAGNOSIS — Z7901 Long term (current) use of anticoagulants: Secondary | ICD-10-CM | POA: Diagnosis not present

## 2021-12-22 DIAGNOSIS — G8929 Other chronic pain: Secondary | ICD-10-CM | POA: Diagnosis not present

## 2021-12-22 DIAGNOSIS — Z981 Arthrodesis status: Secondary | ICD-10-CM | POA: Diagnosis not present

## 2021-12-22 DIAGNOSIS — Z888 Allergy status to other drugs, medicaments and biological substances status: Secondary | ICD-10-CM | POA: Diagnosis not present

## 2021-12-22 DIAGNOSIS — D72829 Elevated white blood cell count, unspecified: Secondary | ICD-10-CM

## 2021-12-22 DIAGNOSIS — Z820 Family history of epilepsy and other diseases of the nervous system: Secondary | ICD-10-CM | POA: Diagnosis not present

## 2021-12-22 DIAGNOSIS — Z86711 Personal history of pulmonary embolism: Secondary | ICD-10-CM | POA: Diagnosis not present

## 2021-12-22 DIAGNOSIS — Z9682 Presence of neurostimulator: Secondary | ICD-10-CM | POA: Diagnosis not present

## 2021-12-22 DIAGNOSIS — M60052 Infective myositis, left thigh: Secondary | ICD-10-CM | POA: Diagnosis not present

## 2021-12-22 LAB — CBC
HCT: 30.7 % — ABNORMAL LOW (ref 39.0–52.0)
Hemoglobin: 9.9 g/dL — ABNORMAL LOW (ref 13.0–17.0)
MCH: 29.2 pg (ref 26.0–34.0)
MCHC: 32.2 g/dL (ref 30.0–36.0)
MCV: 90.6 fL (ref 80.0–100.0)
Platelets: 396 10*3/uL (ref 150–400)
RBC: 3.39 MIL/uL — ABNORMAL LOW (ref 4.22–5.81)
RDW: 18.2 % — ABNORMAL HIGH (ref 11.5–15.5)
WBC: 13 10*3/uL — ABNORMAL HIGH (ref 4.0–10.5)
nRBC: 0.2 % (ref 0.0–0.2)

## 2021-12-22 LAB — COMPREHENSIVE METABOLIC PANEL
ALT: 22 U/L (ref 0–44)
AST: 18 U/L (ref 15–41)
Albumin: 3.5 g/dL (ref 3.5–5.0)
Alkaline Phosphatase: 79 U/L (ref 38–126)
Anion gap: 6 (ref 5–15)
BUN: 9 mg/dL (ref 6–20)
CO2: 25 mmol/L (ref 22–32)
Calcium: 8.6 mg/dL — ABNORMAL LOW (ref 8.9–10.3)
Chloride: 108 mmol/L (ref 98–111)
Creatinine, Ser: 1.19 mg/dL (ref 0.61–1.24)
GFR, Estimated: >60 mL/min (ref >60)
Glucose, Bld: 173 mg/dL — ABNORMAL HIGH (ref 70–99)
Potassium: 4.4 mmol/L (ref 3.5–5.1)
Sodium: 139 mmol/L (ref 135–145)
Total Bilirubin: 0.8 mg/dL (ref 0.3–1.2)
Total Protein: 6.6 g/dL (ref 6.5–8.1)

## 2021-12-22 LAB — CK: Total CK: 90 U/L (ref 49–397)

## 2021-12-22 LAB — LACTIC ACID, PLASMA: Lactic Acid, Venous: 1 mmol/L (ref 0.5–1.9)

## 2021-12-22 LAB — LITHIUM LEVEL: Lithium Lvl: 0.27 mmol/L — ABNORMAL LOW (ref 0.60–1.20)

## 2021-12-22 MED ORDER — ACETAMINOPHEN 650 MG RE SUPP: 650.0000 mg | Freq: Four times a day (QID) | RECTAL | Status: DC | PRN

## 2021-12-22 MED ORDER — OXYCODONE HCL 5 MG PO TABS
5.0000 mg | ORAL_TABLET | ORAL | Status: DC | PRN
  Administered 2021-12-23 – 2021-12-24 (×3): 5 mg via ORAL
  Filled 2021-12-22 (×3): qty 1

## 2021-12-22 MED ORDER — QUETIAPINE FUMARATE 100 MG PO TABS
400.0000 mg | ORAL_TABLET | Freq: Once | ORAL | Status: AC
  Administered 2021-12-22: 400 mg via ORAL
  Filled 2021-12-22: qty 4

## 2021-12-22 MED ORDER — HEPARIN BOLUS VIA INFUSION
4000.0000 [IU] | Freq: Once | INTRAVENOUS | Status: AC
  Administered 2021-12-22: 4000 [IU] via INTRAVENOUS
  Filled 2021-12-22: qty 4000

## 2021-12-22 MED ORDER — FENTANYL CITRATE PF 50 MCG/ML IJ SOSY
100.0000 ug | PREFILLED_SYRINGE | INTRAMUSCULAR | Status: DC | PRN
  Administered 2021-12-22: 100 ug via INTRAVENOUS
  Filled 2021-12-22: qty 2

## 2021-12-22 MED ORDER — MORPHINE SULFATE (PF) 2 MG/ML IV SOLN
2.0000 mg | INTRAVENOUS | Status: AC | PRN
  Administered 2021-12-22 – 2021-12-23 (×3): 2 mg via INTRAVENOUS
  Filled 2021-12-22 (×3): qty 1

## 2021-12-22 MED ORDER — SODIUM CHLORIDE 0.9% FLUSH
3.0000 mL | Freq: Two times a day (BID) | INTRAVENOUS | Status: DC
  Administered 2021-12-23 – 2021-12-24 (×3): 3 mL via INTRAVENOUS

## 2021-12-22 MED ORDER — VANCOMYCIN HCL 1500 MG/300ML IV SOLN
1500.0000 mg | INTRAVENOUS | Status: DC
  Administered 2021-12-22: 1500 mg via INTRAVENOUS
  Filled 2021-12-22: qty 300

## 2021-12-22 MED ORDER — SODIUM CHLORIDE 0.9 % IV SOLN
2.0000 g | INTRAVENOUS | Status: DC
  Administered 2021-12-22: 2 g via INTRAVENOUS
  Filled 2021-12-22: qty 20

## 2021-12-22 MED ORDER — ALBUTEROL SULFATE HFA 108 (90 BASE) MCG/ACT IN AERS: 1.0000 | INHALATION_SPRAY | Freq: Four times a day (QID) | RESPIRATORY_TRACT | Status: DC | PRN

## 2021-12-22 MED ORDER — ALBUTEROL SULFATE (2.5 MG/3ML) 0.083% IN NEBU: 2.5000 mg | INHALATION_SOLUTION | Freq: Four times a day (QID) | RESPIRATORY_TRACT | Status: DC | PRN

## 2021-12-22 MED ORDER — HEPARIN (PORCINE) 25000 UT/250ML-% IV SOLN
1800.0000 [IU]/h | INTRAVENOUS | Status: DC
  Administered 2021-12-22: 1400 [IU]/h via INTRAVENOUS
  Administered 2021-12-23: 2100 [IU]/h via INTRAVENOUS
  Administered 2021-12-23: 1700 [IU]/h via INTRAVENOUS
  Administered 2021-12-24: 2100 [IU]/h via INTRAVENOUS
  Administered 2021-12-25: 1800 [IU]/h via INTRAVENOUS
  Filled 2021-12-22 (×5): qty 250

## 2021-12-22 MED ORDER — ONDANSETRON HCL 4 MG/2ML IJ SOLN: 4.0000 mg | Freq: Three times a day (TID) | INTRAMUSCULAR | Status: DC | PRN

## 2021-12-22 MED ORDER — LITHIUM CARBONATE 300 MG PO CAPS
600.0000 mg | ORAL_CAPSULE | Freq: Every day | ORAL | Status: DC
  Administered 2021-12-22 – 2021-12-24 (×3): 600 mg via ORAL
  Filled 2021-12-22 (×5): qty 2

## 2021-12-22 MED ORDER — DOXEPIN HCL 25 MG PO CAPS
75.0000 mg | ORAL_CAPSULE | Freq: Every day | ORAL | Status: DC
  Administered 2021-12-22 – 2021-12-24 (×3): 75 mg via ORAL
  Filled 2021-12-22 (×4): qty 3

## 2021-12-22 MED ORDER — FENTANYL CITRATE PF 50 MCG/ML IJ SOSY
100.0000 ug | PREFILLED_SYRINGE | Freq: Once | INTRAMUSCULAR | Status: AC
  Administered 2021-12-22: 100 ug via INTRAVENOUS
  Filled 2021-12-22: qty 2

## 2021-12-22 MED ORDER — ACETAMINOPHEN 325 MG PO TABS
650.0000 mg | ORAL_TABLET | Freq: Four times a day (QID) | ORAL | Status: DC | PRN
  Administered 2021-12-24: 650 mg via ORAL
  Filled 2021-12-22: qty 2

## 2021-12-22 MED ORDER — CYCLOBENZAPRINE HCL 10 MG PO TABS
10.0000 mg | ORAL_TABLET | Freq: Three times a day (TID) | ORAL | Status: DC | PRN
  Administered 2021-12-22 – 2021-12-24 (×3): 10 mg via ORAL
  Filled 2021-12-22 (×3): qty 1

## 2021-12-22 MED ORDER — QUETIAPINE FUMARATE 300 MG PO TABS
800.0000 mg | ORAL_TABLET | Freq: Every day | ORAL | Status: DC
  Administered 2021-12-22 – 2021-12-24 (×3): 800 mg via ORAL
  Filled 2021-12-22 (×4): qty 1

## 2021-12-22 MED ORDER — GABAPENTIN 300 MG PO CAPS
300.0000 mg | ORAL_CAPSULE | Freq: Three times a day (TID) | ORAL | Status: DC
  Administered 2021-12-22 – 2021-12-25 (×8): 300 mg via ORAL
  Filled 2021-12-22 (×8): qty 1

## 2021-12-22 MED ORDER — VANCOMYCIN HCL IN DEXTROSE 1-5 GM/200ML-% IV SOLN
1000.0000 mg | INTRAVENOUS | Status: AC
  Administered 2021-12-22 (×2): 1000 mg via INTRAVENOUS
  Filled 2021-12-22 (×2): qty 200

## 2021-12-22 MED ORDER — SODIUM CHLORIDE 0.9 % IV SOLN: INTRAVENOUS | Status: DC

## 2021-12-22 MED ORDER — APIXABAN 5 MG PO TABS: 5.0000 mg | ORAL_TABLET | Freq: Two times a day (BID) | ORAL | Status: DC

## 2021-12-22 MED ORDER — DOXEPIN HCL 75 MG PO CAPS
75.0000 mg | ORAL_CAPSULE | Freq: Every day | ORAL | Status: DC
  Filled 2021-12-22 (×2): qty 1

## 2021-12-22 MED ORDER — MIRTAZAPINE 15 MG PO TABS
45.0000 mg | ORAL_TABLET | Freq: Every day | ORAL | Status: DC
  Administered 2021-12-22 – 2021-12-24 (×3): 45 mg via ORAL
  Filled 2021-12-22 (×3): qty 3

## 2021-12-22 NOTE — ED Provider Notes (Signed)
At this point we will hold his anticoagulation in case he requires any intervention while inpatient   Ripley Fraise, MD 12/22/21 0214

## 2021-12-22 NOTE — H&P (Addendum)
History and Physical    Patient: Justin [name] NIO:[id number] DOB: 1965-08-03 DOA: 12/21/2021 DOS: the patient was seen and examined on 12/22/2021 PCP: [facility]  Patient coming from: [place] transfer   Chief Complaint:  Chief Complaint  Patient presents with   Leg Swelling   HPI: Justin [name] is a 56 y.o. 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  He just recently been admitted in the hospital from 9/22-9/24 after being found to have a spontaneous large left thigh intramuscular hematoma in the setting of supratherapeutic INR greater than 7.  Coumadin was discontinued and patient was switched to Eliquis.  He has been taking Eliquis as prescribed.  Since being discharged patient noted that he had continued swelling in the left leg going down into his calf with dark discoloration present.  He was seen in the ED due to left leg swelling and pain on 9/30 and had a vascular Doppler ultrasound that showed no signs of DVT and was ultimately discharged home with a prescription for oxycodone.  Patient reported burning pain going down the side of his leg and into his groin.  He had been sent night due to symptoms and was having a difficult time ambulating.  He had a significant fever, chest pain, shortness of breath, nausea, vomiting, or diarrhea symptoms.  Plan admission into the emergency department patient was noted to be afebrile with tachycardia and tachypnea and all other vital signs maintained.  Labs significant for WBC 11.9, hemoglobin 9.7, lithium level 0.27, and lactic acid 1.  Blood cultures have been obtained and SIRS criteria are met.  CT angiogram of the left lower extremity noted concern for pyomyositis.  Patient had been given ketorolac 15 mg IV, fentanyl 100 mg IV, and  vancomycin.  Review of Systems: As mentioned in the history of present illness. All other systems reviewed and are negative. Past Medical History:  Diagnosis Date   Achilles rupture, left    Bipolar 1 disorder (HCC)    Dental caries    periodontitis   Pneumonia    Pulmonary embolism ([place]) 05/18/2007   Sleep apnea    wears CPAP   Subdural hematoma ([place]) 01/04/2013   in setting of supratherapeutic INR   Past Surgical History:  Procedure Laterality Date   ACHILLES TENDON SURGERY Left 10/21/2014   Procedure: Left Achilles Reconstruction;  Surgeon: Newt Minion, MD;  Location: [place];  Service: Orthopedics;  Laterality: Left;   APPENDECTOMY     CARDIAC CATHETERIZATION  03/04/2018   [facility]: Normal coronaries, LVEF estimated at 40%, medical Rx   CRANIOTOMY N/A 01/19/2013   Procedure: CRANIOTOMY HEMATOMA EVACUATION SUBDURAL;  Surgeon: Hosie Spangle, MD;  Location: [place] [facility];  Service: Neurosurgery;  Laterality: N/A;   CYSTECTOMY     right head   ELBOW SURGERY     right   FRACTURE SURGERY     finger   I & D EXTREMITY Left 12/07/2016   Procedure: LEFT ACHILLES DEBRIDEMENT;  Surgeon: Newt Minion, MD;  Location: [place];  Service: Orthopedics;  Laterality: Left;   LUMBAR FUSION  12/23/2017   L5 GILL PROCEDURE, RIGHT L5-S1, TRANSFORAMIAL LUMBAR INTERBODY FUSION, BILATERAL LATERAL FUSION, PEDICLE INSTRUMENTATION   MULTIPLE EXTRACTIONS WITH ALVEOLOPLASTY N/A 03/07/2015   Procedure: MULTIPLE EXTRACTION WITH ALVEOLOPLASTY;  Surgeon: Diona Browner, DDS;  Location[facility];  Service: Oral Surgery;  Laterality: N/A;   SPINAL CORD STIMULATOR INSERTION N/A 05/18/2019   Procedure: LUMBAR SPINAL CORD STIMULATOR INSERTION;  Surgeon: Clydell Hakim, MD;  Location: [place];  Service: Neurosurgery;  Laterality: N/A;  Thoracic/Lumbar   SPINAL CORD STIMULATOR INSERTION N/A 04/06/2020   Procedure: Revision of spinal cord stimulator;  Surgeon: Reece Agar, MD;  Location: [place];  Service:  Neurosurgery;  Laterality: N/A;   Social History:  reports that he has never smoked. He has never used smokeless tobacco. He reports that he does not drink alcohol and does not use drugs.  Allergies  Allergen Reactions   Lyrica [Pregabalin] Other (See Comments)    Hallucinations    Family History  Problem Relation Age of Onset   Hypertension Mother    Stroke Mother    Multiple sclerosis Sister    Down syndrome Son     Prior to Admission medications   Medication Sig Start Date End Date Taking? Authorizing Provider  apixaban (ELIQUIS) 5 MG TABS tablet Take 1 tablet (5 mg total) by mouth 2 (two) times daily. 12/11/21  Yes Little Ishikawa, MD  cyclobenzaprine (FLEXERIL) 10 MG tablet Take 1 tablet (10 mg total) by mouth 3 (three) times daily as needed for muscle spasms. 12/03/21  Yes Redwine, Madison A, PA-C  diclofenac Sodium (VOLTAREN) 1 % GEL Apply 4 g topically 4 (four) times daily as needed (pain). 12/08/21  Yes [provider]  dicyclomine (BENTYL) 10 MG capsule Take 10 mg by mouth 3 (three) times daily as needed for spasms. 11/27/21  Yes [provider]  doxepin (SINEQUAN) 75 MG capsule Take 75 mg by mouth at bedtime. 12/14/19  Yes [provider]  gabapentin (NEURONTIN) 300 MG capsule Take 1 capsule (300 mg total) by mouth 3 (three) times daily for 14 days. 12/03/21 12/22/21 Yes Redwine, Madison A, PA-C  HYDROcodone-acetaminophen (NORCO) 7.5-325 MG tablet Take 0.5-1 tablets by mouth daily as needed for moderate pain or severe pain. 12/14/21  Yes [provider]  lithium 600 MG capsule Take 600 mg by mouth at bedtime.   Yes [provider]  mirtazapine (REMERON) 45 MG tablet Take 45 mg by mouth at bedtime.   Yes [provider]  QUEtiapine (SEROQUEL XR) 400 MG 24 hr tablet Take 800 mg by mouth at bedtime. 11/16/21  Yes [provider]  tiZANidine (ZANAFLEX) 4 MG tablet Take 4 mg by mouth at bedtime.  12/17/19  Yes [provider]  albuterol (VENTOLIN HFA) 108 (90 Base) MCG/ACT inhaler Inhale 1-2 puffs into the lungs every 6 (six) hours as needed for wheezing or shortness of breath. Patient not taking: Reported on 12/09/2021 03/21/21   Hazel Sams, PA-C  HYDROcodone-acetaminophen (NORCO/VICODIN) 5-325 MG tablet Take 1 tablet by mouth 3 (three) times daily as needed. Patient not taking: Reported on 12/22/2021 12/03/21   Redwine, Madison A, PA-C  oxyCODONE 7.5 MG TABS Take 7.5 mg by mouth every 4 (four) hours as needed for severe pain. Patient not taking: Reported on 27/09/4126 7/86/76   Delora Fuel, MD    Physical Exam: Vitals:   12/22/21 1205 12/22/21 1430 12/22/21 1536 12/22/21 1635  BP:  126/79  (!) 135/98  Pulse:  95  95  Resp:  14  16  Temp: 98.4 F (36.9 C)  98.1 F (36.7 C) 98.2 F (36.8 C)  TempSrc: Oral  Oral Oral  SpO2:  99%  99%  Weight:  Height:       Constitutional: Middle-age male currently in no acute distress Eyes: PERRL, lids and conjunctivae normal ENMT: Mucous membranes are moist.  Neck: normal, supple,   Respiratory: clear to auscultation bilaterally, no wheezing, no crackles.   Cardiovascular: Regular rate and rhythm, no murmurs / rubs / gallops.  2+ pedal pulses. Abdomen: no tenderness, no masses palpated.   Bowel sounds positive.  Musculoskeletal: no clubbing / cyanosis.  Swelling present of the left lateral thigh with tenderness palpation. Skin: Dark discoloration of skin present of thigh and calf. Neurologic: CN 2-12 grossly intact. Strength 5/5 in all 4.  Psychiatric: Normal judgment and insight. Alert and oriented x 3. Normal mood.   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  Patient had just recently been admitted to the hospital with a spontaneous hematoma after having supratherapeutic INR.  He presents back to the hospital with complaints of left leg pain.  CT  imaging noted large collection in the the vastus lateralis muscle concerning for pyomyositis.  Patient has been started on IV antibiotics of vancomycin. -Admit to a MedSurg bed -Follow-up blood cultures -N.p.o. after midnight for possible need of procedure -Continue IV antibiotics of vancomycin per pharmacy -Rocephin IV -Oxycodone/morphine as needed for moderate to severe pain -IR consulted for possible need of drainage  Leukocytosis SIRS Acute.  Initial WBC elevated 11.9.  Patient met SIRS criteria due to initial tachypnea and tachycardia, but lactic acid was reassuring at 1. -Recheck CBC  Normocytic anemia Hemoglobin 9.7 g/dL which appears similar to prior. -Will continue to monitor  Bipolar disorder On admission lithium level was noted to be 0.27. -Continue doxepin, lithium, mirtazapine, and Seroquel  History of DVT/PE on chronic anticoagulation Patient had been switched to Eliquis during last hospitalization due to the spontaneous hematoma.  He has a IVC filter in place.  Patient notes also previously been on Eliquis and had a blood clot while taking it, but it was because he was only taking it once daily. -Held Eliquis -Heparin per pharmacy for possible need of procedure  OSA on CPAP -Continue CPAP at night  DVT prophylaxis: Heparin Advance Care Planning:   Code Status: Full Code   Consults: IR consulted  Severity of Illness: The appropriate patient status for this patient is OBSERVATION. Observation status is judged to be reasonable and necessary in order to provide the required intensity of service to ensure the patient's safety. The patient's presenting symptoms, physical exam findings, and initial radiographic and laboratory data in the context of their medical condition is felt to place them at decreased risk for further clinical deterioration. Furthermore, it is anticipated that the patient will be medically stable for discharge from the hospital within 2 midnights of  admission.   Author: Norval Morton, MD 12/22/2021 5:00 PM  For on call review [email or web address].

## 2021-12-22 NOTE — ED Notes (Signed)
Patient has returned from [place].  He states his pain has decreased only a "little"

## 2021-12-22 NOTE — ED Provider Notes (Signed)
I assumed care in signout to follow-up on CT imaging Patient presented with increasing left leg hematoma.  No new trauma Recently transition from Coumadin to Eliquis as he has a history of VTE CT imaging was performed which reveals possible pyomyositis.  No gas formation to suggest necrotizing fasciitis. On exam patient is awake and alert.  He has tenderness and induration noted to the left lateral thigh, but there is no crepitus. Distal pulses are intact See photo  We will send cultures, lactic acid, start antibiotics and admit   Patient gave verbal permission to utilize photo for medical documentation only The image was not stored on any personal device   Ripley Fraise, MD 12/22/21 0109

## 2021-12-22 NOTE — ED Notes (Signed)
Pt asking for food or water. Explained reason for NPO status and informed pt would be updated when bed available for transfer.

## 2021-12-22 NOTE — ED Notes (Signed)
Spoke with pharmacy regarding the seroquel rx needing to be XR.  He advised that he will message the MD to order 400mg  of the standard release seroquel.  The pharmacist also advised that the patient can wait for the lithium dose to be given tonight (10/6)

## 2021-12-22 NOTE — Progress Notes (Signed)
Patient placed on CPAP at this time.  

## 2021-12-22 NOTE — Progress Notes (Signed)
Pharmacy Antibiotic/Heparin Note  Justin [name] is a 56 y.o. male admitted on 12/21/2021 with  pyomyositis .  Pharmacy has been consulted for vanc dosing.  Pt was recently here for intramuscular hematoma. 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 He presented again for leg swelling with possible pyomyositis. Vanc was ordered to be started. D/w Dr. Tamala Julian and we will add empiric ceftriaxone also.  Heparin will also be used to bridge while apixaban is being held for possible procedure.   Scr 1  Plan: Vanc 2g IV x1 then 1500mg  q24>>AUC 421, scr 1 Ceftriaxone 2g IV q24 Levels as needed Heparin 4000 units x1 Heparin infusion 1400 units/hr 6 hr HL/aPTT then daily  Height: 5\' 9"  (175.3 cm) Weight: 99.8 kg (220 lb 0.3 oz) IBW/kg (Calculated) : 70.7  Temp (24hrs), Avg:98.3 F (36.8 C), Min:97.8 F (36.6 C), Max:98.8 F (37.1 C)  Recent Labs  Lab 12/16/21 1540 12/21/21 2248 12/22/21 0107  WBC 13.4* 11.9*  --   CREATININE 1.16 1.05  --   LATICACIDVEN  --   --  1.0    Estimated Creatinine Clearance: 89.3 mL/min (by C-G formula based on SCr of 1.05 mg/dL).    Allergies  Allergen Reactions   Lyrica [Pregabalin] Other (See Comments)    Hallucinations    Antimicrobials this admission: 10/6 vanc>> 10/6 ceftriaxone>>  Dose adjustments this admission:   Microbiology results: 10/6 blood>>  Onnie Boer, PharmD, BCIDP, AAHIVP, CPP Infectious Disease Pharmacist 12/22/2021 6:40 PM

## 2021-12-22 NOTE — ED Notes (Signed)
Attempted to collect 2nd blood culture x 2 without success.  Antibiotics started

## 2021-12-22 NOTE — ED Notes (Signed)
Handoff report given to [facility] 

## 2021-12-22 NOTE — ED Provider Notes (Signed)
Lactic acid is unremarkable. Patient is stable in the ER.  He will be admitted for IV antibiotics and further management. Discussed with Dr. Myna Hidalgo for admission   Ripley Fraise, MD 12/22/21 [phone]

## 2021-12-23 DIAGNOSIS — M549 Dorsalgia, unspecified: Secondary | ICD-10-CM | POA: Diagnosis present

## 2021-12-23 DIAGNOSIS — Z820 Family history of epilepsy and other diseases of the nervous system: Secondary | ICD-10-CM | POA: Diagnosis not present

## 2021-12-23 DIAGNOSIS — Z8279 Family history of other congenital malformations, deformations and chromosomal abnormalities: Secondary | ICD-10-CM | POA: Diagnosis not present

## 2021-12-23 DIAGNOSIS — Z7901 Long term (current) use of anticoagulants: Secondary | ICD-10-CM

## 2021-12-23 DIAGNOSIS — M79609 Pain in unspecified limb: Secondary | ICD-10-CM | POA: Diagnosis not present

## 2021-12-23 DIAGNOSIS — Z86711 Personal history of pulmonary embolism: Secondary | ICD-10-CM | POA: Diagnosis not present

## 2021-12-23 DIAGNOSIS — Z981 Arthrodesis status: Secondary | ICD-10-CM | POA: Diagnosis not present

## 2021-12-23 DIAGNOSIS — F319 Bipolar disorder, unspecified: Secondary | ICD-10-CM | POA: Diagnosis present

## 2021-12-23 DIAGNOSIS — Z9682 Presence of neurostimulator: Secondary | ICD-10-CM | POA: Diagnosis not present

## 2021-12-23 DIAGNOSIS — S7012XA Contusion of left thigh, initial encounter: Secondary | ICD-10-CM

## 2021-12-23 DIAGNOSIS — Z823 Family history of stroke: Secondary | ICD-10-CM | POA: Diagnosis not present

## 2021-12-23 DIAGNOSIS — M60009 Infective myositis, unspecified site: Secondary | ICD-10-CM

## 2021-12-23 DIAGNOSIS — D649 Anemia, unspecified: Secondary | ICD-10-CM | POA: Diagnosis present

## 2021-12-23 DIAGNOSIS — G4733 Obstructive sleep apnea (adult) (pediatric): Secondary | ICD-10-CM | POA: Diagnosis present

## 2021-12-23 DIAGNOSIS — M60052 Infective myositis, left thigh: Secondary | ICD-10-CM | POA: Diagnosis present

## 2021-12-23 DIAGNOSIS — G8929 Other chronic pain: Secondary | ICD-10-CM | POA: Diagnosis present

## 2021-12-23 DIAGNOSIS — Z86718 Personal history of other venous thrombosis and embolism: Secondary | ICD-10-CM | POA: Diagnosis not present

## 2021-12-23 DIAGNOSIS — Z79899 Other long term (current) drug therapy: Secondary | ICD-10-CM | POA: Diagnosis not present

## 2021-12-23 DIAGNOSIS — Z888 Allergy status to other drugs, medicaments and biological substances status: Secondary | ICD-10-CM | POA: Diagnosis not present

## 2021-12-23 DIAGNOSIS — Z8249 Family history of ischemic heart disease and other diseases of the circulatory system: Secondary | ICD-10-CM | POA: Diagnosis not present

## 2021-12-23 LAB — CBC
HCT: 31 % — ABNORMAL LOW (ref 39.0–52.0)
Hemoglobin: 10.1 g/dL — ABNORMAL LOW (ref 13.0–17.0)
MCH: 28.9 pg (ref 26.0–34.0)
MCHC: 32.6 g/dL (ref 30.0–36.0)
MCV: 88.8 fL (ref 80.0–100.0)
Platelets: 334 10*3/uL (ref 150–400)
RBC: 3.49 MIL/uL — ABNORMAL LOW (ref 4.22–5.81)
RDW: 18.3 % — ABNORMAL HIGH (ref 11.5–15.5)
WBC: 11.6 10*3/uL — ABNORMAL HIGH (ref 4.0–10.5)
nRBC: 0 % (ref 0.0–0.2)

## 2021-12-23 LAB — APTT
aPTT: 39 seconds — ABNORMAL HIGH (ref 24–36)
aPTT: 47 seconds — ABNORMAL HIGH (ref 24–36)
aPTT: 63 seconds — ABNORMAL HIGH (ref 24–36)

## 2021-12-23 LAB — HEPARIN LEVEL (UNFRACTIONATED): Heparin Unfractionated: 0.67 IU/mL (ref 0.30–0.70)

## 2021-12-23 MED ORDER — TRAZODONE HCL 50 MG PO TABS
50.0000 mg | ORAL_TABLET | Freq: Every evening | ORAL | Status: DC | PRN
  Administered 2021-12-23 – 2021-12-24 (×2): 50 mg via ORAL
  Filled 2021-12-23 (×2): qty 1

## 2021-12-23 MED ORDER — HYDRALAZINE HCL 20 MG/ML IJ SOLN: 10.0000 mg | INTRAMUSCULAR | Status: DC | PRN

## 2021-12-23 MED ORDER — METOPROLOL TARTRATE 5 MG/5ML IV SOLN: 5.0000 mg | INTRAVENOUS | Status: DC | PRN

## 2021-12-23 MED ORDER — SENNOSIDES-DOCUSATE SODIUM 8.6-50 MG PO TABS
1.0000 | ORAL_TABLET | Freq: Every evening | ORAL | Status: DC | PRN
  Administered 2021-12-24: 1 via ORAL
  Filled 2021-12-23: qty 1

## 2021-12-23 MED ORDER — GUAIFENESIN 100 MG/5ML PO LIQD: 5.0000 mL | ORAL | Status: DC | PRN

## 2021-12-23 NOTE — Consult Note (Signed)
[facility] for Infectious Disease    Date of Admission:  12/21/2021     Reason for Consult: left thigh fluid collection    Referring Provider: Reesa Chew    Abx: 10/6-c ceftriaxone  10/6 vanc        Assessment: 56 yo male dvt/pe on chronic anticoagulation, bipolar on lithium, s/p spinal stimulator for back pain, osa, with presumed left lateral thigh spontaneous hematoma 9/22-24 (while on coumadin with inr 7 --> changed to Eliquis), admitted again 10/05 for continued swelling trickling down to left leg with associated pain, id called for evaluating possible infection   I reviewed ct scan, reports show organization of previous presumed hematoma. No significant enlargement. No sepsis. Slight leukocytosis nonspecific  His eliquis is hold and he is on heparin gtt. His PE was several years ago. On 10/27/21 he has duplex u/s LE that showed "chronic" peroneal vein dvt but repeat 9/14 and 9/30 duplex didn't see it  No area to target sampling after discussion with IR  Given inability to sample and clinically low suspicion of infection at this time, wouldn't treat with antibiotics  10/6 bcx (appear to be obtained before a dose of empiric abx given) ngtd   Plan: Stop abx Will see what 10/06 bcx becomes  (although only 1 set was obtained) Supportive care for what appears to be hematoma Discussed with primary team   I spent 75 minute reviewing data/chart, and coordinating care and >50% direct face to face time providing counseling/discussing diagnostics/treatment plan with patient      ------------------------------------------------ Principal Problem:   Pyomyositis Active Problems:   Chronic anticoagulation   Bipolar disorder, unspecified ([place])   Obstructive sleep apnea on CPAP   History of pulmonary embolism   Leukocytosis   SIRS (systemic inflammatory response syndrome) ([place])   Normocytic anemia    HPI: Justin [name] is a 56 y.o. male dvt/pe on chronic  anticoagulation, bipolar on lithium, s/p spinal stimulator for back pain, osa, with presumed left lateral thigh spontaneous hematoma 9/22-24 (while on coumadin with inr 7 --> changed to Eliquis), admitted again 10/05 for continued swelling trickling down to left leg with associated pain, id called for evaluating possible infection   Patient given oxycodone previous admission but returned for some trickling/echymosis change down to left lower extremity  No fever/chill  No focal joint/new back pain otherwise   He has spinal cord stimulator for chronic back pain placed 05/2019 and leads replaced 03/2020.   Ed: Afebrile; hds Wbc 11-13 Bcx in process Cta LLE reviewed as below Ir/surgery consulted; surgery deferred to ir for sampling but discussion with ir no discrete pocket to sample Given 1 dose vanc/ceftriaxone  Bcx remains ngtd    Family History  Problem Relation Age of Onset   Hypertension Mother    Stroke Mother    Multiple sclerosis Sister    Down syndrome Son     Social History   Tobacco Use   Smoking status: Never   Smokeless tobacco: Never  Vaping Use   Vaping Use: Never used  Substance Use Topics   Alcohol use: No   Drug use: No    Allergies  Allergen Reactions   Lyrica [Pregabalin] Other (See Comments)    Hallucinations    Review of Systems: ROS All Other ROS was negative, except mentioned above   Past Medical History:  Diagnosis Date   Achilles rupture, left    Bipolar 1 disorder ([place])    Dental caries  periodontitis   Pneumonia    Pulmonary embolism ([place]) 05/18/2007   Sleep apnea    wears CPAP   Subdural hematoma ([place]) 01/04/2013   in setting of supratherapeutic INR       Scheduled Meds:  doxepin  75 mg Oral QHS   gabapentin  300 mg Oral TID   lithium  600 mg Oral QHS   mirtazapine  45 mg Oral QHS   QUEtiapine  800 mg Oral QHS   sodium chloride flush  3 mL Intravenous Q12H   Continuous Infusions:  sodium chloride 75 mL/hr at  12/23/21 0621   cefTRIAXone (ROCEPHIN)  IV 2 g (12/22/21 1848)   heparin 2,000 Units/hr (12/23/21 1228)   PRN Meds:.acetaminophen **OR** acetaminophen, albuterol, cyclobenzaprine, fentaNYL (SUBLIMAZE) injection, guaiFENesin, hydrALAZINE, metoprolol tartrate, morphine injection, ondansetron (ZOFRAN) IV, oxyCODONE, senna-docusate, traZODone   OBJECTIVE: Blood pressure (!) 126/94, pulse 92, temperature 98.4 F (36.9 C), temperature source Oral, resp. rate 16, height 5\' 9"  (1.753 m), weight 99.8 kg, SpO2 99 %.  Physical Exam  General/constitutional: no distress, pleasant HEENT: Normocephalic, PER, Conj Clear, EOMI, Oropharynx clear Neck supple CV: rrr no mrg Lungs: clear to auscultation, normal respiratory effort Abd: Soft, Nontender Ext: no edema Skin: ?slight echymoses changes on left anterior chin/lateral thigh Neuro: nonfocal MSK: tender lateral left thigh/distal left LE; no back tenderness   Lab Results Lab Results  Component Value Date   WBC 11.6 (H) 12/23/2021   HGB 10.1 (L) 12/23/2021   HCT 31.0 (L) 12/23/2021   MCV 88.8 12/23/2021   PLT 334 12/23/2021    Lab Results  Component Value Date   CREATININE 1.19 12/22/2021   BUN 9 12/22/2021   NA 139 12/22/2021   K 4.4 12/22/2021   CL 108 12/22/2021   CO2 25 12/22/2021    Lab Results  Component Value Date   ALT 22 12/22/2021   AST 18 12/22/2021   ALKPHOS 79 12/22/2021   BILITOT 0.8 12/22/2021      Microbiology: Recent Results (from the past 240 hour(s))  Blood culture (routine x 2)     Status: None (Preliminary result)   Collection Time: 12/22/21  1:20 AM   Specimen: BLOOD  Result Value Ref Range Status   Specimen Description   Final    BLOOD BLOOD RIGHT FOREARM Performed at [facility], [address], [place], [place] [address]    Special Requests   Final    BOTTLES DRAWN AEROBIC AND ANAEROBIC Blood Culture results may not be optimal due to an excessive volume of blood received in  culture bottles Performed at [facility], [address], [place], [place] [address]    Culture   Final    NO GROWTH < 24 HOURS Performed at [facility], [place] [address], [place], [place] [address]    Report Status PENDING  Incomplete     Serology:    Imaging: If present, new imagings (plain films, ct scans, and mri) have been personally visualized and interpreted; radiology reports have been reviewed. Decision making incorporated into the Impression / Recommendations.  10/05 ct lle angiography 1. No arterial stenosis.  No findings of deep venous thrombosis. 2. Trace calcific plaques in the left internal iliac artery and scattered along the left anterior and posterior tibial arteries. 3. As compared to the study of 12/09/2021, large collection in the vastus lateralis muscle appears more organized and is more complex, with numerous enhancing septations and multiple tiny fluid pockets. The appearance is most suggestive  of a pyomyositis although component of hematoma could still be present. No soft tissue gas is seen but there is edema in the anterior compartment deep soft tissue planes which can be seen with fasciitis. 4. Generalized left lower extremity edema. No subcutaneous abscess or soft tissue gas. 5. 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 No overlying cortical subsidence or significant findings of secondary hip DJD. 6. 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 7. Stable 1.7 cm soft tissue nodule in the left ischioanal fossa subcutaneous fat. Nonspecific. Follow-up as indicated. 8. Mildly enlarged lymph nodes in the left external iliac and inguinal chains probably reactive. 9. Mild prostatomegaly.    Jabier Mutton, [place] for Infectious [place] [phone] pager    12/23/2021, 2:36 PM

## 2021-12-23 NOTE — Progress Notes (Signed)
IR consulted regarding possible right thigh pyomyositis.  Per Dr. Annamaria Boots, after review there is not a role for aspiration or drainage by IR at this time.   IR remains available as needed.  Electronically Signed: Pasty Spillers, PA-C 12/23/2021, 9:07 AM

## 2021-12-23 NOTE — Progress Notes (Signed)
ANTICOAGULATION CONSULT NOTE - Follow Up Consult  Pharmacy Consult for heparin Indication:  hx of VTE  Allergies  Allergen Reactions   Lyrica [Pregabalin] Other (See Comments)    Hallucinations    Patient Measurements: Height: 5\' 9"  (175.3 cm) Weight: 99.8 kg (220 lb 0.3 oz) IBW/kg (Calculated) : 70.7 Heparin Dosing Weight: 91.8 kg  Vital Signs: Temp: 98.4 F (36.9 C) (10/07 0728) Temp Source: Oral (10/07 0728) BP: 126/94 (10/07 0728) Pulse Rate: 92 (10/07 0728)  Labs: Recent Labs    12/21/21 2248 12/22/21 1820 12/23/21 0203 12/23/21 0916  HGB 9.7* 9.9* 10.1*  --   HCT 29.9* 30.7* 31.0*  --   PLT 395 396 334  --   APTT  --   --  39* 47*  LABPROT 14.4  --   --   --   INR 1.1  --   --   --   HEPARINUNFRC  --   --  0.67  --   CREATININE 1.05 1.19  --   --   CKTOTAL  --  90  --   --     Estimated Creatinine Clearance: 78.8 mL/min (by C-G formula based on SCr of 1.19 mg/dL).   Medications:  Scheduled:   doxepin  75 mg Oral QHS   gabapentin  300 mg Oral TID   lithium  600 mg Oral QHS   mirtazapine  45 mg Oral QHS   QUEtiapine  800 mg Oral QHS   sodium chloride flush  3 mL Intravenous Q12H   Infusions:   sodium chloride 75 mL/hr at 12/23/21 0621   cefTRIAXone (ROCEPHIN)  IV 2 g (12/22/21 1848)   heparin 1,700 Units/hr (12/23/21 0858)    Assessment: 56yo male subtherapeutic on heparin, aPTT 47 seconds. Per protocol, will increase dose by 3 units/kg/hr. No infusion issues or signs of bleeding per RN. Pt was recently admitted 9/22-9/24 for large hematoma related to supratherapeutic INR on warfarin. Warfarin was discontinued and switched to Eliquis. Pt stated he developed a clot due to taking Eliquis once a day. Will educate pt about strict adherence to Eliquis BID. Eliquis is currently on hold.    Goal of Therapy:  Heparin level 0.3-0.7 units/ml aPTT 66-102 seconds Monitor platelets by anticoagulation protocol: Yes   Plan:  Will increase heparin infusion to  2000 units/hr. Check aPTT in 6 hrs @ 1800 Check aPTT and HL daily until correlating Check signs and symptoms of bleeding and CBC daily  Candelaria Stagers Nolyn Eilert 12/23/2021,11:16 AM

## 2021-12-23 NOTE — Progress Notes (Signed)
ANTICOAGULATION CONSULT NOTE - Follow Up Consult  Pharmacy Consult for heparin Indication:  hx of VTE  Allergies  Allergen Reactions   Lyrica [Pregabalin] Other (See Comments)    Hallucinations    Patient Measurements: Height: 5\' 9"  (175.3 cm) Weight: 99.8 kg (220 lb 0.3 oz) IBW/kg (Calculated) : 70.7 Heparin Dosing Weight: 91.8 kg  Vital Signs: Temp: 98.4 F (36.9 C) (10/07 1608) Temp Source: Oral (10/07 1608) BP: 113/82 (10/07 1608) Pulse Rate: 98 (10/07 1608)  Labs: Recent Labs    12/21/21 2248 12/22/21 1820 12/23/21 0203 12/23/21 0916 12/23/21 1742  HGB 9.7* 9.9* 10.1*  --   --   HCT 29.9* 30.7* 31.0*  --   --   PLT 395 396 334  --   --   APTT  --   --  39* 47* 63*  LABPROT 14.4  --   --   --   --   INR 1.1  --   --   --   --   HEPARINUNFRC  --   --  0.67  --   --   CREATININE 1.05 1.19  --   --   --   CKTOTAL  --  90  --   --   --      Estimated Creatinine Clearance: 78.8 mL/min (by C-G formula based on SCr of 1.19 mg/dL).   Medications:  Scheduled:   doxepin  75 mg Oral QHS   gabapentin  300 mg Oral TID   lithium  600 mg Oral QHS   mirtazapine  45 mg Oral QHS   QUEtiapine  800 mg Oral QHS   sodium chloride flush  3 mL Intravenous Q12H   Infusions:   sodium chloride 75 mL/hr at 12/23/21 0621   heparin 2,000 Units/hr (12/23/21 1228)    Assessment: 56yo male subtherapeutic on heparin, aPTT 47 seconds. Per protocol, will increase dose by 3 units/kg/hr. No infusion issues or signs of bleeding per RN. Pt was recently admitted 9/22-9/24 for large hematoma related to supratherapeutic INR on warfarin. Warfarin was discontinued and switched to Eliquis. Pt stated he developed a clot due to taking Eliquis once a day. Will educate pt about strict adherence to Eliquis BID. Eliquis is currently on hold.    10/7 PM update: APTT 63 (low)  Goal of Therapy:  Heparin level 0.3-0.7 units/ml aPTT 66-102 seconds Monitor platelets by anticoagulation protocol: Yes    Plan:  Will increase heparin infusion to 2100 units/hr. Check aPTT in 6 hrs @ 0130 Check aPTT and HL daily until correlating Check signs and symptoms of bleeding and CBC daily  Demmi Sindt A. Levada Dy, PharmD, BCPS, FNKF Clinical Pharmacist [place] Please utilize Amion for appropriate phone number to reach the unit pharmacist ([place])  12/23/2021,6:39 PM

## 2021-12-23 NOTE — Progress Notes (Signed)
PROGRESS NOTE    Justin [name]  HAF:[id number] DOB: 1965-09-15 DOA: 12/21/2021 PCP: [facility]   Brief Narrative:  56 y.o. 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  He just recently been admitted in the hospital from 9/22-9/24 after being found to have a spontaneous large left thigh intramuscular hematoma in the setting of supratherapeutic INR greater than 7.  Coumadin was discontinued and patient was switched to Eliquis.  He has been taking Eliquis as prescribed.  Since being discharged patient noted that he had continued swelling in the left leg going down into his calf with dark discoloration present.  CT angiogram of the left lower extremity noted concern for pyomyositis.     Assessment & Plan:  Principal Problem:   Pyomyositis Active Problems:   Leukocytosis   SIRS (systemic inflammatory response syndrome) (HCC)   Chronic anticoagulation   Bipolar disorder, unspecified (HCC)   Obstructive sleep apnea on CPAP   History of pulmonary embolism   Normocytic anemia   Sepsis secondary to pyomyositis Left thigh Recently had spontaneous hematoma due to Supratherapeutic INR, Now coming with concerns of pyomyositis. Nothing to drain her IR, spoke with Dr Miles Costain. Discussed with Dr Susa Simmonds from ortho, also recommends Abx management.  ID consulted for their input ID requested to hold off on antibiotic until seen by their service.  Eventually will be NPO.  Follow-up blood culture data    Normocytic anemia Hemoglobin currently stable   Bipolar disorder Continue home medications at this time   History of DVT/PE on chronic anticoagulation Patient is on Eliquis at home which is currently on hold and on heparin drip.  OSA on CPAP -Continue CPAP at night  DVT prophylaxis: Hep  Drip Code Status: Full Code Family Communication: Family at bedside  Ongoing treatment evaluation per ID.     Subjective: Patient tells me he has significant discomfort in the left thigh and feels little bit more swollen compared to right.  He overall feels very upset about this.  No other complaints.   Examination:  Constitutional: Not in acute distress Respiratory: Clear to auscultation bilaterally Cardiovascular: Normal sinus rhythm, no rubs Abdomen: Nontender nondistended good bowel sounds Musculoskeletal: Left thigh slightly swollen compared to right Skin: No rashes seen Neurologic: CN 2-12 grossly intact.  And nonfocal Psychiatric: Normal judgment and insight. Alert and oriented x 3. Normal mood.  Objective: Vitals:   12/22/21 2255 12/22/21 2323 12/23/21 0520 12/23/21 0728  BP:  123/80 (!) 123/90 (!) 126/94  Pulse:  (!) 107 95 92  Resp: 18 20 20 16   Temp:  98.1 F (36.7 C) 98.3 F (36.8 C) 98.4 F (36.9 C)  TempSrc:  Oral Oral Oral  SpO2:  100% 97% 99%  Weight:      Height:        Intake/Output Summary (Last 24 hours) at 12/23/2021 0842 Last data filed at 12/23/2021 0522 Gross per 24 hour  Intake 1423.17 ml  Output 4400 ml  Net -2976.83 ml   Filed Weights   12/21/21 2035  Weight: 99.8 kg     Data Reviewed:   CBC: Recent Labs  Lab 12/16/21 1540 12/21/21 2248 12/22/21 1820 12/23/21 0203  WBC 13.4* 11.9* 13.0* 11.6*  NEUTROABS 10.4* 8.9*  --   --   HGB 9.7* 9.7* 9.9* 10.1*  HCT 29.8* 29.9*  30.7* 31.0*  MCV 88.7 89.3 90.6 88.8  PLT 418* 395 396 334   Basic Metabolic Panel: Recent Labs  Lab 12/16/21 1540 12/21/21 2248 12/22/21 1820  NA 138 141 139  K 3.8 4.0 4.4  CL 106 106 108  CO2 26 28 25   GLUCOSE 139* 136* 173*  BUN <5* 9 9  CREATININE 1.16 1.05 1.19  CALCIUM 8.5* 8.9 8.6*   GFR: Estimated Creatinine Clearance: 78.8 mL/min (by C-G formula based on SCr of 1.19 mg/dL). Liver Function Tests: Recent Labs  Lab 12/22/21 1820  AST  18  ALT 22  ALKPHOS 79  BILITOT 0.8  PROT 6.6  ALBUMIN 3.5   No results for input(s): "LIPASE", "AMYLASE" in the last 168 hours. No results for input(s): "AMMONIA" in the last 168 hours. Coagulation Profile: Recent Labs  Lab 12/16/21 1540 12/21/21 2248  INR 1.1 1.1   Cardiac Enzymes: Recent Labs  Lab 12/22/21 1820  CKTOTAL 90   BNP (last 3 results) No results for input(s): "PROBNP" in the last 8760 hours. HbA1C: No results for input(s): "HGBA1C" in the last 72 hours. CBG: No results for input(s): "GLUCAP" in the last 168 hours. Lipid Profile: No results for input(s): "CHOL", "HDL", "LDLCALC", "TRIG", "CHOLHDL", "LDLDIRECT" in the last 72 hours. Thyroid Function Tests: No results for input(s): "TSH", "T4TOTAL", "FREET4", "T3FREE", "THYROIDAB" in the last 72 hours. Anemia Panel: No results for input(s): "VITAMINB12", "FOLATE", "FERRITIN", "TIBC", "IRON", "RETICCTPCT" in the last 72 hours. Sepsis Labs: Recent Labs  Lab 12/22/21 0107  LATICACIDVEN 1.0    Recent Results (from the past 240 hour(s))  Blood culture (routine x 2)     Status: None (Preliminary result)   Collection Time: 12/22/21  1:20 AM   Specimen: BLOOD  Result Value Ref Range Status   Specimen Description   Final    BLOOD BLOOD RIGHT FOREARM Performed at [facility], [address] [place], [place]    Special Requests   Final    BOTTLES DRAWN AEROBIC AND ANAEROBIC Blood Culture results may not be optimal due to an excessive volume of blood received in culture bottles Performed at [facility], [address] [place], [place]    Culture   Final    NO GROWTH < 24 HOURS Performed at [facility], [address] [place], [place]    Report Status PENDING  Incomplete         Radiology Studies: CT EXTREMITY LOWER LEFT W CONTRAST  Result Date: 12/22/2021 CLINICAL DATA:  Worsening swelling in the left thigh. EXAM: CT  ANGIOGRAPHY OF THE LEFT LOWER EXTREMITY; CT OF THE LEFT LOWER EXTREMITY WITH CONTRAST TECHNIQUE: Multidetector CTA imaging of the entire left lower extremity was performed using the standard protocol during bolus administration of intravenous contrast. Multiplanar CT image reconstructions and MIPs were obtained to evaluate the vascular anatomy. Subsequently, multidetector CT imaging of the entire left lower extremity was repeated and the combined arterial and venous phase using the standard protocol, with multiplanar CT image reconstructions including MIP RADIATION DOSE REDUCTION: This exam was performed according to the departmental dose-optimization program which includes automated exposure control, adjustment of the mA and/or kV according to patient size and/or use of iterative reconstruction technique. CONTRAST:  36mL OMNIPAQUE IOHEXOL 350 MG/ML SOLN COMPARISON:  Left thigh and femoral CT 12/09/2021, with contrast. FINDINGS: CT ANGIOGRAPHY LEFT LOWER EXTREMITY FINDINGS Inflow: Only the most distal extent of the left common iliac artery is included in the  scan and is patent. The internal and external iliac arteries widely patent. No aneurysm or dissection is seen. There are scattered trace calcifications in the left internal iliac artery. Outflow: There are no visible plaques, stenoses, dissections or aneurysms of the left common femoral, profunda femoral, medial and lateral circumflex femoral and superficial femoral arteries. The popliteal artery is also normal. Runoff: Patent three-vessel runoff into the foot. There are scattered trace calcifications beginning to develop in the anterior and posterior tibial arteries. Left hemipelvis: Mild prostatomegaly. No significant thickening of the visualized bladder. Left hemipelvis demonstrates no free fluid, free hemorrhage, free air or new adenopathy. Again noted is a mildly enlarged left external iliac chain node measuring 3.5 x 1.2 cm. There are slightly prominent  left inguinal chain nodes up to 1.1 cm in short axis which were not previously enlarged. There is no incarcerated hernia. Both testicles are in the scrotal sac. CT LEFT LOWER EXTREMITY WITH CONTRAST FINDINGS Vascular: There is no appreciable deep vein thrombosis. Bones/joints/cartilage: No fracture, destructive bone lesion or aggressive periostitis is seen in the left lower extremity. In the anterior superior left femoral head, there is a well-circumscribed, chronic appearing lesion compatible with osteonecrosis measuring 2.3 x 1.4 x 1.2 cm, with a thin sclerotic border without evidence of subsidence of the overlying cortex, and a homogeneous ground-glass matrix. There is early nonerosive degenerative arthrosis of the medial femorotibial joint and lateral patellofemoral joint in knee and trace suprapatellar bursal fluid probably physiologic, without popliteal cyst. There is no significant arthrosis in the foot and ankle. Ligaments: Suboptimally assessed by CT. Muscles and tendons: There is a large complex heterogeneous collection in the vastus lateralis, primarily the outer aspect of the muscle, characterized by innumerable rim enhancing septations and tiny fluid locules. Maximum measurements are 21 cm craniocaudal, 3.3 cm transversely and 8.9 cm AP. 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 There is no appreciable collection in the remainder of the thigh muscles but there is edema in the adjacent deep soft tissue planes in the anterior thigh compartment. There is no soft tissue gas but certainly early necrotizing fasciitis is not excluded. The musculature and deep soft tissues of the gluteal compartments, dorsal thigh compartment, and foreleg are unremarkable as are the visualized tendons. Soft tissues: Subcutaneous edema continues to be seen in the thigh and extends into the foreleg. There is scattered nonlocalizing fluid along the fat muscle interfaces but no subcutaneous abscess or soft tissue gas. A  1.7 cm soft tissue nodule is stably redemonstrated in the left ischioanal fossa, nonspecific. This is best seen on series 4 axial 85 of the CT lower extremity study. Review of the MIP images confirms the above findings. IMPRESSION: 1. No arterial stenosis.  No findings of deep venous thrombosis. 2. Trace calcific plaques in the left internal iliac artery and scattered along the left anterior and posterior tibial arteries. 3. As compared to the study of 12/09/2021, large collection in the vastus lateralis muscle appears more organized and is more complex, with numerous enhancing septations and multiple tiny fluid pockets. The appearance is most suggestive of a pyomyositis although component of hematoma could still be present. No soft tissue gas is seen but there is edema in the anterior compartment deep soft tissue planes which can be seen with fasciitis. 4. Generalized left lower extremity edema. No subcutaneous abscess or soft tissue gas. 5. 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 No overlying cortical subsidence or significant findings of secondary hip DJD. 6. 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 7. Stable 1.7 cm soft tissue nodule in the left ischioanal fossa subcutaneous fat. Nonspecific. Follow-up as indicated. 8. Mildly enlarged lymph nodes in the left external iliac and inguinal chains probably reactive. 9. Mild prostatomegaly. Electronically Signed   By: Almira Bar M.D.   On: 12/22/2021 00:51   CT ANGIO LOW EXTREM LEFT W &/OR WO CONTRAST  Result Date: 12/22/2021 CLINICAL DATA:  Worsening swelling in the left thigh. EXAM: CT ANGIOGRAPHY OF THE LEFT LOWER EXTREMITY; CT OF THE LEFT LOWER EXTREMITY WITH CONTRAST TECHNIQUE: Multidetector CTA imaging of the entire left lower extremity was performed using the standard protocol during bolus administration of intravenous contrast. Multiplanar CT image reconstructions and MIPs were obtained to evaluate the vascular anatomy. Subsequently, multidetector  CT imaging of the entire left lower extremity was repeated and the combined arterial and venous phase using the standard protocol, with multiplanar CT image reconstructions including MIP RADIATION DOSE REDUCTION: This exam was performed according to the departmental dose-optimization program which includes automated exposure control, adjustment of the mA and/or kV according to patient size and/or use of iterative reconstruction technique. CONTRAST:  66mL OMNIPAQUE IOHEXOL 350 MG/ML SOLN COMPARISON:  Left thigh and femoral CT 12/09/2021, with contrast. FINDINGS: CT ANGIOGRAPHY LEFT LOWER EXTREMITY FINDINGS Inflow: Only the most distal extent of the left common iliac artery is included in the scan and is patent. The internal and external iliac arteries widely patent. No aneurysm or dissection is seen. There are scattered trace calcifications in the left internal iliac artery. Outflow: There are no visible plaques, stenoses, dissections or aneurysms of the left common femoral, profunda femoral, medial and lateral circumflex femoral and superficial femoral arteries. The popliteal artery is also normal. Runoff: Patent three-vessel runoff into the foot. There are scattered trace calcifications beginning to develop in the anterior and posterior tibial arteries. Left hemipelvis: Mild prostatomegaly. No significant thickening of the visualized bladder. Left hemipelvis demonstrates no free fluid, free hemorrhage, free air or new adenopathy. Again noted is a mildly enlarged left external iliac chain node measuring 3.5 x 1.2 cm. There are slightly prominent left inguinal chain nodes up to 1.1 cm in short axis which were not previously enlarged. There is no incarcerated hernia. Both testicles are in the scrotal sac. CT LEFT LOWER EXTREMITY WITH CONTRAST FINDINGS Vascular: There is no appreciable deep vein thrombosis. Bones/joints/cartilage: No fracture, destructive bone lesion or aggressive periostitis is seen in the left lower  extremity. In the anterior superior left femoral head, there is a well-circumscribed, chronic appearing lesion compatible with osteonecrosis measuring 2.3 x 1.4 x 1.2 cm, with a thin sclerotic border without evidence of subsidence of the overlying cortex, and a homogeneous ground-glass matrix. There is early nonerosive degenerative arthrosis of the medial femorotibial joint and lateral patellofemoral joint in knee and trace suprapatellar bursal fluid probably physiologic, without popliteal cyst. There is no significant arthrosis in the foot and ankle. Ligaments: Suboptimally assessed by CT. Muscles and tendons: There is a large complex heterogeneous collection in the vastus lateralis, primarily the outer aspect of the muscle, characterized by innumerable rim enhancing septations and tiny fluid locules. Maximum measurements are 21 cm craniocaudal, 3.3 cm transversely and 8.9 cm AP. 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 There is no appreciable collection in the remainder of the thigh muscles but there is edema in the adjacent deep soft tissue planes in the anterior thigh compartment. There is no soft tissue gas but certainly early necrotizing fasciitis is not excluded. The musculature  and deep soft tissues of the gluteal compartments, dorsal thigh compartment, and foreleg are unremarkable as are the visualized tendons. Soft tissues: Subcutaneous edema continues to be seen in the thigh and extends into the foreleg. There is scattered nonlocalizing fluid along the fat muscle interfaces but no subcutaneous abscess or soft tissue gas. A 1.7 cm soft tissue nodule is stably redemonstrated in the left ischioanal fossa, nonspecific. This is best seen on series 4 axial 85 of the CT lower extremity study. Review of the MIP images confirms the above findings. IMPRESSION: 1. No arterial stenosis.  No findings of deep venous thrombosis. 2. Trace calcific plaques in the left internal iliac artery and scattered along  the left anterior and posterior tibial arteries. 3. As compared to the study of 12/09/2021, large collection in the vastus lateralis muscle appears more organized and is more complex, with numerous enhancing septations and multiple tiny fluid pockets. The appearance is most suggestive of a pyomyositis although component of hematoma could still be present. No soft tissue gas is seen but there is edema in the anterior compartment deep soft tissue planes which can be seen with fasciitis. 4. Generalized left lower extremity edema. No subcutaneous abscess or soft tissue gas. 5. 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 No overlying cortical subsidence or significant findings of secondary hip DJD. 6. 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 7. Stable 1.7 cm soft tissue nodule in the left ischioanal fossa subcutaneous fat. Nonspecific. Follow-up as indicated. 8. Mildly enlarged lymph nodes in the left external iliac and inguinal chains probably reactive. 9. Mild prostatomegaly. Electronically Signed   By: Almira Bar M.D.   On: 12/22/2021 00:51        Scheduled Meds:  doxepin  75 mg Oral QHS   gabapentin  300 mg Oral TID   lithium  600 mg Oral QHS   mirtazapine  45 mg Oral QHS   QUEtiapine  800 mg Oral QHS   sodium chloride flush  3 mL Intravenous Q12H   Continuous Infusions:  sodium chloride 75 mL/hr at 12/23/21 0621   cefTRIAXone (ROCEPHIN)  IV 2 g (12/22/21 1848)   heparin 1,700 Units/hr (12/23/21 0304)   vancomycin 1,500 mg (12/22/21 2118)     LOS: 0 days   Time spent= 35 mins    Aryahi Denzler Joline Maxcy, MD [facility]  If 7PM-7AM, please contact night-coverage  12/23/2021, 8:42 AM

## 2021-12-23 NOTE — Progress Notes (Signed)
ANTICOAGULATION CONSULT NOTE - Follow Up Consult  Pharmacy Consult for heparin Indication:  h/o VTE  Labs: Recent Labs    12/21/21 2248 12/22/21 1820 12/23/21 0203  HGB 9.7* 9.9* 10.1*  HCT 29.9* 30.7* 31.0*  PLT 395 396 334  APTT  --   --  39*  LABPROT 14.4  --   --   INR 1.1  --   --   HEPARINUNFRC  --   --  0.67  CREATININE 1.05 1.19  --   CKTOTAL  --  90  --     Assessment: 56yo male subtherapeutic on heparin with initial dosing while Eliquis on hold; no infusion issues or signs of bleeding per RN.  Of note pt had a recent admission for large hematoma related to supratherapeutic INR.  Goal of Therapy:  aPTT 66-102 seconds   Plan:  Will increase heparin infusion by 3 units/kg/hr to 1700 units/hr and check PTT in 6 hours.    Wynona Neat, PharmD, BCPS  12/23/2021,3:02 AM

## 2021-12-24 ENCOUNTER — Inpatient Hospital Stay (HOSPITAL_COMMUNITY): Payer: Medicaid Other

## 2021-12-24 DIAGNOSIS — M79609 Pain in unspecified limb: Secondary | ICD-10-CM | POA: Diagnosis not present

## 2021-12-24 DIAGNOSIS — M60009 Infective myositis, unspecified site: Secondary | ICD-10-CM | POA: Diagnosis not present

## 2021-12-24 LAB — CBC
HCT: 30.3 % — ABNORMAL LOW (ref 39.0–52.0)
HCT: 31.7 % — ABNORMAL LOW (ref 39.0–52.0)
Hemoglobin: 10.1 g/dL — ABNORMAL LOW (ref 13.0–17.0)
Hemoglobin: 10.2 g/dL — ABNORMAL LOW (ref 13.0–17.0)
MCH: 29.6 pg (ref 26.0–34.0)
MCH: 29.2 pg (ref 26.0–34.0)
MCHC: 33.3 g/dL (ref 30.0–36.0)
MCHC: 32.2 g/dL (ref 30.0–36.0)
MCV: 88.9 fL (ref 80.0–100.0)
MCV: 90.8 fL (ref 80.0–100.0)
Platelets: 347 10*3/uL (ref 150–400)
Platelets: 351 10*3/uL (ref 150–400)
RBC: 3.41 MIL/uL — ABNORMAL LOW (ref 4.22–5.81)
RBC: 3.49 MIL/uL — ABNORMAL LOW (ref 4.22–5.81)
RDW: 18.4 % — ABNORMAL HIGH (ref 11.5–15.5)
RDW: 18.1 % — ABNORMAL HIGH (ref 11.5–15.5)
WBC: 9.6 10*3/uL (ref 4.0–10.5)
WBC: 10.4 10*3/uL (ref 4.0–10.5)
nRBC: 0 % (ref 0.0–0.2)
nRBC: 0 % (ref 0.0–0.2)

## 2021-12-24 LAB — MAGNESIUM
Magnesium: 2.3 mg/dL (ref 1.7–2.4)
Magnesium: 2.1 mg/dL (ref 1.7–2.4)

## 2021-12-24 LAB — BASIC METABOLIC PANEL
Anion gap: 7 (ref 5–15)
Anion gap: 6 (ref 5–15)
BUN: 11 mg/dL (ref 6–20)
BUN: 12 mg/dL (ref 6–20)
CO2: 25 mmol/L (ref 22–32)
CO2: 23 mmol/L (ref 22–32)
Calcium: 9.1 mg/dL (ref 8.9–10.3)
Calcium: 8.8 mg/dL — ABNORMAL LOW (ref 8.9–10.3)
Chloride: 107 mmol/L (ref 98–111)
Chloride: 108 mmol/L (ref 98–111)
Creatinine, Ser: 1.17 mg/dL (ref 0.61–1.24)
Creatinine, Ser: 1.21 mg/dL (ref 0.61–1.24)
GFR, Estimated: >60 mL/min (ref >60)
GFR, Estimated: >60 mL/min (ref >60)
Glucose, Bld: 154 mg/dL — ABNORMAL HIGH (ref 70–99)
Glucose, Bld: 157 mg/dL — ABNORMAL HIGH (ref 70–99)
Potassium: 4.1 mmol/L (ref 3.5–5.1)
Potassium: 3.7 mmol/L (ref 3.5–5.1)
Sodium: 139 mmol/L (ref 135–145)
Sodium: 137 mmol/L (ref 135–145)

## 2021-12-24 LAB — HEPARIN LEVEL (UNFRACTIONATED)
Heparin Unfractionated: 1.02 IU/mL — ABNORMAL HIGH (ref 0.30–0.70)
Heparin Unfractionated: 1.06 IU/mL — ABNORMAL HIGH (ref 0.30–0.70)
Heparin Unfractionated: 1.04 IU/mL — ABNORMAL HIGH (ref 0.30–0.70)

## 2021-12-24 LAB — APTT
aPTT: 79 seconds — ABNORMAL HIGH (ref 24–36)
aPTT: 104 seconds — ABNORMAL HIGH (ref 24–36)
aPTT: 106 seconds — ABNORMAL HIGH (ref 24–36)

## 2021-12-24 MED ORDER — LITHIUM CARBONATE ER 450 MG PO TBCR
450.0000 mg | EXTENDED_RELEASE_TABLET | Freq: Every day | ORAL | Status: DC
  Administered 2021-12-24 – 2021-12-25 (×2): 450 mg via ORAL
  Filled 2021-12-24 (×2): qty 1

## 2021-12-24 NOTE — Progress Notes (Signed)
PT decided to not use cpap tonight. 

## 2021-12-24 NOTE — Progress Notes (Signed)
ANTICOAGULATION CONSULT NOTE - Follow Up Consult  Pharmacy Consult for heparin Indication:  hx of VTE  Allergies  Allergen Reactions   Lyrica [Pregabalin] Other (See Comments)    Hallucinations    Patient Measurements: Height: 5\' 9"  (175.3 cm) Weight: 99.8 kg (220 lb 0.3 oz) IBW/kg (Calculated) : 70.7 Heparin Dosing Weight: 91.8 kg  Vital Signs: Temp: 98.8 F (37.1 C) (10/08 0754) Temp Source: Oral (10/08 0754) BP: 140/96 (10/08 0754) Pulse Rate: 91 (10/08 0754)  Labs: Recent Labs    12/21/21 2248 12/22/21 1820 12/23/21 0203 12/23/21 0916 12/23/21 1742 12/24/21 0248 12/24/21 1036  HGB 9.7* 9.9* 10.1*  --   --  10.1*  --   HCT 29.9* 30.7* 31.0*  --   --  30.3*  --   PLT 395 396 334  --   --  347  --   APTT  --   --  39*   < > 63* 79* 104*  LABPROT 14.4  --   --   --   --   --   --   INR 1.1  --   --   --   --   --   --   HEPARINUNFRC  --   --  0.67  --   --  1.02* 1.06*  CREATININE 1.05 1.19  --   --   --  1.17  --   CKTOTAL  --  90  --   --   --   --   --    < > = values in this interval not displayed.     Estimated Creatinine Clearance: 80.1 mL/min (by C-G formula based on SCr of 1.17 mg/dL).   Medications:  Scheduled:   doxepin  75 mg Oral QHS   gabapentin  300 mg Oral TID   lithium carbonate  450 mg Oral Daily   lithium  600 mg Oral QHS   mirtazapine  45 mg Oral QHS   QUEtiapine  800 mg Oral QHS   sodium chloride flush  3 mL Intravenous Q12H   Infusions:   sodium chloride 75 mL/hr at 12/24/21 0904   heparin 2,100 Units/hr (12/24/21 1231)    Assessment: 56yo male supratherapeutic on heparin, aPTT 104 seconds and HL was 1.06. No infusion issues or signs of bleeding per RN and patient. Pt was recently admitted 9/22-9/24 for large hematoma related to supratherapeutic INR on warfarin. Warfarin was discontinued and switched to Eliquis. Pt stated he developed a clot due to taking Eliquis once a day. Will educate pt about strict adherence to Eliquis BID.  Eliquis is currently on hold.    Goal of Therapy:  Heparin level 0.3-0.7 units/ml aPTT 66-102 seconds Monitor platelets by anticoagulation protocol: Yes   Plan:  Will decrease heparin infusion to 2000 units/hr. Check aPTT in 6 hrs @ 2200 Check aPTT and HL daily until correlating Check signs and symptoms of bleeding and CBC daily  Varney Daily, PharmD PGY2 Pharmacy Resident  Please check AMION for all [facility] pharmacy phone numbers After 10:00 PM call main pharmacy [phone]

## 2021-12-24 NOTE — Progress Notes (Signed)
CPAP machine set up and ready at bedside. PT stated that he will place CPAP when ready.

## 2021-12-24 NOTE — Progress Notes (Signed)
ANTICOAGULATION CONSULT NOTE - Follow Up Consult  Pharmacy Consult for heparin Indication:  hx of VTE  Allergies  Allergen Reactions   Lyrica [Pregabalin] Other (See Comments)    Hallucinations    Patient Measurements: Height: 5\' 9"  (175.3 cm) Weight: 99.8 kg (220 lb 0.3 oz) IBW/kg (Calculated) : 70.7 Heparin Dosing Weight: 91.8 kg  Vital Signs: Temp: 98.2 F (36.8 C) (10/08 2056) Temp Source: Oral (10/08 2056) BP: 142/106 (10/08 2056) Pulse Rate: 106 (10/08 2056)  Labs: Recent Labs    12/22/21 1820 12/23/21 0203 12/23/21 0916 12/24/21 0248 12/24/21 1036 12/24/21 2218  HGB 9.9* 10.1*  --  10.1*  --  10.2*  HCT 30.7* 31.0*  --  30.3*  --  31.7*  PLT 396 334  --  347  --  351  APTT  --  39*   < > 79* 104* 106*  HEPARINUNFRC  --  0.67  --  1.02* 1.06*  --   CREATININE 1.19  --   --  1.17  --  1.21  CKTOTAL 90  --   --   --   --   --    < > = values in this interval not displayed.     Estimated Creatinine Clearance: 77.5 mL/min (by C-G formula based on SCr of 1.21 mg/dL).   Medications:  Scheduled:   doxepin  75 mg Oral QHS   gabapentin  300 mg Oral TID   lithium carbonate  450 mg Oral Daily   lithium  600 mg Oral QHS   mirtazapine  45 mg Oral QHS   QUEtiapine  800 mg Oral QHS   sodium chloride flush  3 mL Intravenous Q12H   Infusions:   sodium chloride 75 mL/hr at 12/24/21 2149   heparin 2,000 Units/hr (12/24/21 1628)    Assessment: 56yo male supratherapeutic on heparin, aPTT 104 seconds and HL was 1.06. No infusion issues or signs of bleeding per RN and patient. Pt was recently admitted 9/22-9/24 for large hematoma related to supratherapeutic INR on warfarin. Warfarin was discontinued and switched to Eliquis. Pt stated he developed a clot due to taking Eliquis once a day. Will educate pt about strict adherence to Eliquis BID. Eliquis is currently on hold.    10/8 PM update:  APTT supra-therapeutic   Goal of Therapy:  Heparin level 0.3-0.7  units/ml aPTT 66-102 seconds Monitor platelets by anticoagulation protocol: Yes   Plan:  Will decrease heparin infusion to 1800 units/hr. Check aPTT in 6-8 hrs Check aPTT and HL daily until correlating Check signs and symptoms of bleeding and CBC daily  Narda Bonds, PharmD, BCPS Clinical Pharmacist Phone: [phone]

## 2021-12-24 NOTE — Progress Notes (Signed)
PROGRESS NOTE    Justin [name]  QVZ:[id number] DOB: 1965-06-22 DOA: 12/21/2021 PCP: [facility]   Brief Narrative:  56 y.o. 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  He just recently been admitted in the hospital from 9/22-9/24 after being found to have a spontaneous large left thigh intramuscular hematoma in the setting of supratherapeutic INR greater than 7.  Coumadin was discontinued and patient was switched to Eliquis.  He has been taking Eliquis as prescribed.  Since being discharged patient noted that he had continued swelling in the left leg going down into his calf with dark discoloration present.  CT angiogram of the left lower extremity noted concern for pyomyositis.  Case discussed with IR and Ortho was unable to obtain any culture data due to lack of discrete collection.  ID recommending monitoring blood cultures and holding off on antibiotics at this time.   Assessment & Plan:  Principal Problem:   Pyomyositis Active Problems:   Leukocytosis   SIRS (systemic inflammatory response syndrome) (HCC)   Chronic anticoagulation   Bipolar disorder, unspecified (HCC)   Obstructive sleep apnea on CPAP   History of pulmonary embolism   Normocytic anemia   Hematoma of left thigh   Sepsis secondary to pyomyositis Left thigh Recently had spontaneous hematoma due to Supratherapeutic INR, Now coming with concerns of pyomyositis.  Per IR and orthopedic, no discrete collection to drain and does not recommend surgery.  ID recommending monitoring off antibiotics and watch blood cultures for now.    Normocytic anemia Hemoglobin currently stable   Bipolar disorder Continue home medications at this time   History of DVT/PE on chronic anticoagulation Eliquis is on hold, on  heparin drip.  Still having significant left calf pain therefore ultrasound/Doppler ordered to rule out any further DVT or extension.  His recent ultrasound from 9/30 were negative  OSA on CPAP -Continue CPAP at night  Discussed with Dr Annamaria Boots from IR and Dr Lucia Gaskins from [place].   DVT prophylaxis: Hep Drip Code Status: Full Code Family Communication: Family at bedside  Ongoing management by ID.  Monitoring cultures.  Subjective: Patient tells me he has had significant pain in the left lower extremity especially left calf especially when he ambulates.  Feels like he has a knot in his left calf.  Denies any nausea vomiting.  At rest he feels okay.  Examination:  Constitutional: Not in acute distress Respiratory: Clear to auscultation bilaterally Cardiovascular: Normal sinus rhythm, no rubs Abdomen: Nontender nondistended good bowel sounds Musculoskeletal: Left thigh is tender to palpation Skin: No rashes seen Neurologic: CN 2-12 grossly intact.  And nonfocal Psychiatric: Normal judgment and insight. Alert and oriented x 3. Normal mood.  Objective: Vitals:   12/23/21 1608 12/23/21 2043 12/24/21 0605 12/24/21 0754  BP: 113/82 (!) 140/93 (!) 121/92 (!) 140/96  Pulse: 98 98 98 91  Resp: 16 16 16 19   Temp: 98.4 F (36.9 C) 99.3 F (37.4 C) 98.1 F (36.7 C) 98.8 F (37.1 C)  TempSrc: Oral Oral Oral Oral  SpO2: 98% 99% 98% 99%  Weight:      Height:        Intake/Output Summary (Last 24 hours) at 12/24/2021 1046 Last data filed at 12/24/2021 0910 Gross per 24 hour  Intake --  Output 1800 ml  Net -1800 ml  Filed Weights   12/21/21 2035  Weight: 99.8 kg     Data Reviewed:   CBC: Recent Labs  Lab 12/21/21 2248 12/22/21 1820 12/23/21 0203 12/24/21 0248  WBC 11.9* 13.0* 11.6* 9.6  NEUTROABS 8.9*  --   --   --   HGB 9.7* 9.9* 10.1* 10.1*  HCT 29.9* 30.7* 31.0* 30.3*  MCV 89.3 90.6 88.8 88.9  PLT 395 396 334 347   Basic Metabolic Panel: Recent Labs  Lab  12/21/21 2248 12/22/21 1820 12/24/21 0248  NA 141 139 139  K 4.0 4.4 4.1  CL 106 108 107  CO2 28 25 25   GLUCOSE 136* 173* 154*  BUN 9 9 11   CREATININE 1.05 1.19 1.17  CALCIUM 8.9 8.6* 9.1  MG  --   --  2.3   GFR: Estimated Creatinine Clearance: 80.1 mL/min (by C-G formula based on SCr of 1.17 mg/dL). Liver Function Tests: Recent Labs  Lab 12/22/21 1820  AST 18  ALT 22  ALKPHOS 79  BILITOT 0.8  PROT 6.6  ALBUMIN 3.5   No results for input(s): "LIPASE", "AMYLASE" in the last 168 hours. No results for input(s): "AMMONIA" in the last 168 hours. Coagulation Profile: Recent Labs  Lab 12/21/21 2248  INR 1.1   Cardiac Enzymes: Recent Labs  Lab 12/22/21 1820  CKTOTAL 90   BNP (last 3 results) No results for input(s): "PROBNP" in the last 8760 hours. HbA1C: No results for input(s): "HGBA1C" in the last 72 hours. CBG: No results for input(s): "GLUCAP" in the last 168 hours. Lipid Profile: No results for input(s): "CHOL", "HDL", "LDLCALC", "TRIG", "CHOLHDL", "LDLDIRECT" in the last 72 hours. Thyroid Function Tests: No results for input(s): "TSH", "T4TOTAL", "FREET4", "T3FREE", "THYROIDAB" in the last 72 hours. Anemia Panel: No results for input(s): "VITAMINB12", "FOLATE", "FERRITIN", "TIBC", "IRON", "RETICCTPCT" in the last 72 hours. Sepsis Labs: Recent Labs  Lab 12/22/21 0107  LATICACIDVEN 1.0    Recent Results (from the past 240 hour(s))  Blood culture (routine x 2)     Status: None (Preliminary result)   Collection Time: 12/22/21  1:20 AM   Specimen: BLOOD  Result Value Ref Range Status   Specimen Description   Final    BLOOD BLOOD RIGHT FOREARM Performed at [facility], [address] [place], [address] [place]    Special Requests   Final    BOTTLES DRAWN AEROBIC AND ANAEROBIC Blood Culture results may not be optimal due to an excessive volume of blood received in culture bottles Performed at [facility], [address], [place], [address] [place]    Culture   Final    NO GROWTH 2 DAYS Performed at [facility], [address] [place], [address] [place]    Report Status PENDING  Incomplete         Radiology Studies: No results found.      Scheduled Meds:  doxepin  75 mg Oral QHS   gabapentin  300 mg Oral TID   lithium carbonate  450 mg Oral Daily   lithium  600 mg Oral QHS   mirtazapine  45 mg Oral QHS   QUEtiapine  800 mg Oral QHS   sodium chloride flush  3 mL Intravenous Q12H   Continuous Infusions:  sodium chloride 75 mL/hr at 12/24/21 0904   heparin 2,100 Units/hr (12/23/21 2336)     LOS: 1 day   Time spent= 35 mins    Aryani Daffern 02/23/22, MD [facility]  If  7PM-7AM, please contact night-coverage  12/24/2021, 10:46 AM

## 2021-12-24 NOTE — Plan of Care (Signed)

## 2021-12-24 NOTE — Progress Notes (Signed)
[facility] NOTE  Pharmacy Consult for heparin Indication:  hx of VTE Brief A/P: aPTT within goal range Continue Heparin at current rate    Allergies  Allergen Reactions   Lyrica [Pregabalin] Other (See Comments)    Hallucinations    Patient Measurements: Height: 5\' 9"  (175.3 cm) Weight: 99.8 kg (220 lb 0.3 oz) IBW/kg (Calculated) : 70.7 Heparin Dosing Weight: 91.8 kg  Vital Signs: Temp: 99.3 F (37.4 C) (10/07 2043) Temp Source: Oral (10/07 2043) BP: 140/93 (10/07 2043) Pulse Rate: 98 (10/07 2043)  Labs: Recent Labs    12/21/21 2248 12/22/21 1820 12/22/21 1820 12/23/21 0203 12/23/21 0916 12/23/21 1742 12/24/21 0248  HGB 9.7* 9.9*  --  10.1*  --   --  10.1*  HCT 29.9* 30.7*  --  31.0*  --   --  30.3*  PLT 395 396  --  334  --   --  347  APTT  --   --    < > 39* 47* 63* 79*  LABPROT 14.4  --   --   --   --   --   --   INR 1.1  --   --   --   --   --   --   HEPARINUNFRC  --   --   --  0.67  --   --  1.02*  CREATININE 1.05 1.19  --   --   --   --  1.17  CKTOTAL  --  90  --   --   --   --   --    < > = values in this interval not displayed.     Estimated Creatinine Clearance: 80.1 mL/min (by C-G formula based on SCr of 1.17 mg/dL).  Assessment: 56 y.o. male with h/p PE/DVT, Eliquis on hold, for heparin  Goal of Therapy:  Heparin level 0.3-0.7 units/ml aPTT 66-102 seconds Monitor platelets by anticoagulation protocol: Yes   Plan:  Continue Heparin at current rate   Phillis Knack, PharmD, BCPS  12/24/2021,3:43 AM

## 2021-12-24 NOTE — Progress Notes (Signed)
Id brief update   Bcx remains negative from 10/05  No sign of sepsis   -I would recommend to continue monitoring for evolution of presumed hematoma related left LE change off of antibiotics -can f/u pcp in 1-2 weeks -advise sign of sepsis (fever, chill, sudden increased pain locally to f/u sooner -will sign off -discussed with primary team

## 2021-12-24 NOTE — Progress Notes (Signed)
Lower extremity venous left study completed.  Preliminary results relayed to Reesa Chew, MD.  See CV Proc for preliminary results report.   Darlin Coco, RDMS, RVT

## 2021-12-25 ENCOUNTER — Other Ambulatory Visit (HOSPITAL_COMMUNITY): Payer: Self-pay

## 2021-12-25 DIAGNOSIS — M60009 Infective myositis, unspecified site: Secondary | ICD-10-CM | POA: Diagnosis not present

## 2021-12-25 LAB — APTT: aPTT: 90 seconds — ABNORMAL HIGH (ref 24–36)

## 2021-12-25 LAB — HEPARIN LEVEL (UNFRACTIONATED): Heparin Unfractionated: 1.01 IU/mL — ABNORMAL HIGH (ref 0.30–0.70)

## 2021-12-25 MED ORDER — HYDROCODONE-ACETAMINOPHEN 7.5-325 MG PO TABS
0.5000 | ORAL_TABLET | Freq: Three times a day (TID) | ORAL | 0 refills | Status: DC | PRN
  Filled 2021-12-25: qty 21, 7d supply, fill #0

## 2021-12-25 MED ORDER — CYCLOBENZAPRINE HCL 10 MG PO TABS
10.0000 mg | ORAL_TABLET | Freq: Three times a day (TID) | ORAL | 0 refills | Status: DC | PRN
  Filled 2021-12-25: qty 30, 10d supply, fill #0

## 2021-12-25 MED ORDER — APIXABAN 5 MG PO TABS
5.0000 mg | ORAL_TABLET | Freq: Two times a day (BID) | ORAL | Status: DC
  Administered 2021-12-25: 5 mg via ORAL
  Filled 2021-12-25: qty 1

## 2021-12-25 NOTE — Progress Notes (Signed)
ANTICOAGULATION CONSULT NOTE - Follow Up Consult  Pharmacy Consult for heparin Indication:  hx of VTE  Allergies  Allergen Reactions   Lyrica [Pregabalin] Other (See Comments)    Hallucinations    Patient Measurements: Height: 5\' 9"  (175.3 cm) Weight: 99.8 kg (220 lb 0.3 oz) IBW/kg (Calculated) : 70.7 Heparin Dosing Weight: 91.8 kg  Vital Signs: Temp: 98.4 F (36.9 C) (10/09 0755) Temp Source: Oral (10/09 0755) BP: 123/93 (10/09 0755) Pulse Rate: 95 (10/09 0755)  Labs: Recent Labs    12/22/21 1820 12/22/21 1820 12/23/21 0203 12/23/21 0916 12/24/21 0248 12/24/21 1036 12/24/21 2218 12/25/21 0733  HGB 9.9*  --  10.1*  --  10.1*  --  10.2*  --   HCT 30.7*  --  31.0*  --  30.3*  --  31.7*  --   PLT 396  --  334  --  347  --  351  --   APTT  --   --  39*   < > 79* 104* 106* 90*  HEPARINUNFRC  --    < > 0.67  --  1.02* 1.06* 1.04* 1.01*  CREATININE 1.19  --   --   --  1.17  --  1.21  --   CKTOTAL 90  --   --   --   --   --   --   --    < > = values in this interval not displayed.     Estimated Creatinine Clearance: 77.5 mL/min (by C-G formula based on SCr of 1.21 mg/dL).   Medications:  Scheduled:   doxepin  75 mg Oral QHS   gabapentin  300 mg Oral TID   lithium carbonate  450 mg Oral Daily   lithium  600 mg Oral QHS   mirtazapine  45 mg Oral QHS   QUEtiapine  800 mg Oral QHS   sodium chloride flush  3 mL Intravenous Q12H   Infusions:   sodium chloride 75 mL/hr at 12/24/21 2149   heparin 1,800 Units/hr (12/25/21 0102)    Assessment: 56yo male supratherapeutic on heparin, aPTT 104 seconds and HL was 1.06. No infusion issues or signs of bleeding per RN and patient. Pt was recently admitted 9/22-9/24 for large hematoma related to supratherapeutic INR on warfarin. Warfarin was discontinued and switched to Eliquis. Pt stated he developed a clot due to taking Eliquis once a day. Will educate pt about strict adherence to Eliquis BID. Eliquis is currently on hold.     CBC stable aPTT 90, therapeutic Heparin level 1.01, supratherapeutic (not correlating with aPTT)  Goal of Therapy:  Heparin level 0.3-0.7 units/ml aPTT 66-102 seconds Monitor platelets by anticoagulation protocol: Yes   Plan:  Continue heparin infusion at therapeutic rate of 1800 units/hr. Re-check aPTT in 6 hrs Monitor aPTT and HL daily until correlating Check signs and symptoms of bleeding and CBC daily  Luisa Hart, PharmD, BCPS Clinical Pharmacist 12/25/2021 8:33 AM   Please refer to AMION for pharmacy phone number;a

## 2021-12-25 NOTE — Evaluation (Signed)
Occupational Therapy Evaluation Patient Details Name: Justin [name] MRN: [id number] DOB: 02-23-1966 Today's Date: 12/25/2021   History of Present Illness 55 y.o. presented with complaints of left leg swelling.  Recently been admitted in the hospital from 9/22-9/24 after being found to have a spontaneous large left thigh intramuscular hematoma. Concern for Pyomyositis. 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 Educated pt on strategies on to reduce risk of falls and complete ADL tasks safely. Pt would benefit from a shower chair - CM notified. No further OT needed. Encourage ambulation.      Recommendations for follow up therapy are one component of a multi-disciplinary discharge planning process, led by the attending physician.  Recommendations may be updated based on patient status, additional functional criteria and insurance authorization.   Follow Up Recommendations  No OT follow up    Assistance Recommended at Discharge Set up Supervision/Assistance  Patient can return home with the following      Functional Status Assessment     Equipment Recommendations  Tub/shower seat    Recommendations for Other Services       Precautions / Restrictions Precautions Precautions: Fall      Mobility Bed Mobility Overal bed mobility: Independent                  Transfers Overall transfer level: Modified independent                        Balance Overall balance assessment: Mild deficits observed, not formally tested                                         ADL either performed or assessed with clinical judgement   ADL Overall ADL's : Needs assistance/impaired                                        General ADL Comments: Educated on strategies to reduce3 risk of falls; educated on recommendation for use of a shower chair and to step into tube with strong leg first, then sit on shower chair before bringing LLE into tub; pt verblaized understanding     Vision         Perception     Praxis      Pertinent Vitals/Pain Pain Assessment Pain Assessment: 0-10 Faces Pain Scale: Hurts whole lot Pain Location: left thigh with weightbearing Pain Descriptors / Indicators: Guarding, Sore, Aching Pain Intervention(s): Limited activity within patient's tolerance     Hand Dominance Left   Extremity/Trunk Assessment Upper Extremity Assessment Upper Extremity Assessment: Overall WFL for tasks assessed   Lower Extremity Assessment Lower Extremity Assessment: Defer to PT evaluation   Cervical / Trunk Assessment Cervical / Trunk Assessment: Other exceptions (hx of back surgery)   Communication     Cognition Arousal/Alertness: Awake/alert Behavior During Therapy: WFL for tasks assessed/performed Overall Cognitive Status: Within Functional Limits for tasks assessed  General Comments       Exercises     Shoulder Instructions      Home Living Family/patient expects to be discharged to:: Private residence Living Arrangements: Children Available Help at Discharge: Family Type of Home: Apartment Home Access: Level entry     Home Layout: One level     Bathroom Shower/Tub: Teacher, early years/pre: Standard Bathroom Accessibility: Yes How Accessible: Accessible via walker Home Equipment: Conservation officer, nature (2 wheels)          Prior Functioning/Environment Prior Level of Function : Independent/Modified Independent               ADLs Comments: cares for his son        OT Problem List: Decreased strength;Decreased knowledge of use of DME or AE;Pain      OT  Treatment/Interventions:      OT Goals(Current goals can be found in the care plan section) Acute Rehab OT Goals Patient Stated Goal: to not have a painful leg OT Goal Formulation: All assessment and education complete, DC therapy  OT Frequency:      Co-evaluation              AM-PAC OT "6 Clicks" Daily Activity     Outcome Measure Help from another person eating meals?: None Help from another person taking care of personal grooming?: None Help from another person toileting, which includes using toliet, bedpan, or urinal?: None Help from another person bathing (including washing, rinsing, drying)?: None Help from another person to put on and taking off regular upper body clothing?: None Help from another person to put on and taking off regular lower body clothing?: None 6 Click Score: 24   End of Session Equipment Utilized During Treatment: Rolling walker (2 wheels) Nurse Communication: Other (comment) (DME needs)  Activity Tolerance: Patient tolerated treatment well Patient left: in bed;with call bell/phone within reach;with family/visitor present  OT Visit Diagnosis: Muscle weakness (generalized) (M62.81);Pain;Unsteadiness on feet (R26.81) Pain - Right/Left: Left Pain - part of body: Leg                Time: 3557-3220 OT Time Calculation (min): 23 min Charges:  OT General Charges $OT Visit: 1 Visit OT Evaluation $OT Eval Low Complexity: [place], OT/L   Acute OT Clinical Specialist [place] Pager [phone] Office [phone]   [facility] 12/25/2021, 9:57 AM

## 2021-12-25 NOTE — Evaluation (Signed)
Physical Therapy Evaluation Patient Details Name: Justin [name] MRN: [id number] DOB: 31-Mar-1965 Today's Date: 12/25/2021  History of Present Illness  56 y.o. presented with complaints of left leg swelling.  Recently been admitted in the hospital from 9/22-9/24 after being found to have a spontaneous large left thigh intramuscular hematoma. Concern for Pyomyositis. 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 Pt at a mod I to supervision level with mobility, but complaining of pain and tightness in LLE. Occasional buckling noted in L knee but able to self recover. Pt using RW at home to help with pain. Recommending outpatient PT to address mobility deficits. Will continue to follow acutely.        Recommendations for follow up therapy are one component of a multi-disciplinary discharge planning process, led by the attending physician.  Recommendations may be updated based on patient status, additional functional criteria and insurance authorization.  Follow Up Recommendations Outpatient PT      Assistance Recommended at Discharge Intermittent Supervision/Assistance  Patient can return home with the following  Help with stairs or ramp for entrance;Assistance with cooking/housework    Equipment Recommendations Other (comment) (tub/shower chair)  Recommendations for Other Services       Functional Status Assessment Patient has had a recent decline in their functional status and demonstrates the ability to make significant improvements in function in a reasonable and predictable amount of time.     Precautions / Restrictions Precautions Precautions: Fall Restrictions Weight Bearing Restrictions: No      Mobility  Bed Mobility Overal bed mobility:  Independent                  Transfers Overall transfer level: Modified independent                      Ambulation/Gait Ambulation/Gait assistance: Supervision Gait Distance (Feet): 75 Feet Assistive device: Rolling walker (2 wheels) Gait Pattern/deviations: Step-through pattern, Decreased step length - right, Decreased step length - left, Decreased weight shift to left Gait velocity: Decreased     General Gait Details: Slow, mildly antalgic gait. Heavy reliance on UEs. Reporting increased tightness behind L knee  Stairs            Wheelchair Mobility    Modified Rankin (Stroke Patients Only)       Balance Overall balance assessment: Mild deficits observed, not formally tested                                           Pertinent Vitals/Pain Pain Assessment Pain Assessment: 0-10 Faces Pain Scale: Hurts whole lot Pain Location: left thigh with weightbearing Pain Descriptors / Indicators: Guarding, Sore, Aching Pain Intervention(s): Limited activity within patient's tolerance, Monitored during session, Repositioned    Home Living Family/patient expects to be discharged to:: Private residence Living Arrangements: Children Available Help at Discharge: Family Type of Home: Apartment Home Access: Level entry       Home Layout: One level Home Equipment: Conservation officer, nature (2 wheels)      Prior Function Prior Level of Function : Independent/Modified Independent               ADLs Comments: cares for his son  Hand Dominance   Dominant Hand: Left    Extremity/Trunk Assessment   Upper Extremity Assessment Upper Extremity Assessment: Defer to OT evaluation    Lower Extremity Assessment Lower Extremity Assessment: LLE deficits/detail LLE Deficits / Details: Swelling and pain in L thigh and ankle. Complaining of tightness behind the knee    Cervical / Trunk Assessment Cervical / Trunk Assessment: Other exceptions  (hx of back surgery)  Communication   Communication: No difficulties  Cognition Arousal/Alertness: Awake/alert Behavior During Therapy: WFL for tasks assessed/performed Overall Cognitive Status: Within Functional Limits for tasks assessed                                          General Comments General comments (skin integrity, edema, etc.): Son present in room    Exercises     Assessment/Plan    PT Assessment Patient needs continued PT services  PT Problem List Decreased activity tolerance;Decreased mobility       PT Treatment Interventions DME instruction;Gait training;Functional mobility training;Therapeutic activities;Therapeutic exercise;Balance training;Patient/family education    PT Goals (Current goals can be found in the Care Plan section)  Acute Rehab PT Goals Patient Stated Goal: to go home PT Goal Formulation: With patient Time For Goal Achievement: 01/08/22 Potential to Achieve Goals: Good    Frequency Min 2X/week     Co-evaluation               AM-PAC PT "6 Clicks" Mobility  Outcome Measure Help needed turning from your back to your side while in a flat bed without using bedrails?: None Help needed moving from lying on your back to sitting on the side of a flat bed without using bedrails?: None Help needed moving to and from a bed to a chair (including a wheelchair)?: None Help needed standing up from a chair using your arms (e.g., wheelchair or bedside chair)?: None Help needed to walk in hospital room?: A Little Help needed climbing 3-5 steps with a railing? : A Lot 6 Click Score: 21    End of Session Equipment Utilized During Treatment: Gait belt Activity Tolerance: Patient tolerated treatment well Patient left: in bed;with call bell/phone within reach;with family/visitor present Nurse Communication: Mobility status PT Visit Diagnosis: Pain;Difficulty in walking, not elsewhere classified (R26.2) Pain - Right/Left:  Left Pain - part of body: Leg    Time: 2956-2130 PT Time Calculation (min) (ACUTE ONLY): 16 min   Charges:   PT Evaluation $PT Eval Low Complexity: 1 Low          Cindee Salt, DPT  Acute Rehabilitation Services  Office: [phone]   Justin [name] 12/25/2021, 10:33 AM

## 2021-12-25 NOTE — Discharge Summary (Signed)
Physician Discharge Summary  Justin [name] KGM:[id number] DOB: 08/22/1965 DOA: 12/21/2021  PCP: Gean Birchwood, [facility]  Admit date: 12/21/2021 Discharge date: 12/25/2021  Admitted From: Home Disposition:  Home  Recommendations for Outpatient Follow-up:  Follow up with PCP in 1-2 weeks Please obtain BMP/CBC in one week your next doctors visit.  Advised to seek medical attention if he starts spiking fever, chills, increasing pain Follow-up with PCP.  Continue home Eliquis  Home Health: Equipment/Devices: Discharge Condition: Stable CODE STATUS:  Diet recommendation:   Brief/Interim Summary: 56 y.o. 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  He just recently been admitted in the hospital from 9/22-9/24 after being found to have a spontaneous large left thigh intramuscular hematoma in the setting of supratherapeutic INR greater than 7.  Coumadin was discontinued and patient was switched to Eliquis.  He has been taking Eliquis as prescribed.  Since being discharged patient noted that he had continued swelling in the left leg going down into his calf with dark discoloration present.  CT angiogram of the left lower extremity noted concern for pyomyositis.  Case discussed with IR and Ortho was unable to obtain any culture data due to lack of discrete collection.  ID recommending monitoring blood cultures and holding off on antibiotics at this time.  Cultures remain negative, cleared by ID for discharge.     Assessment & Plan:  Principal Problem:   Pyomyositis Active Problems:   Leukocytosis   SIRS (systemic inflammatory response syndrome) (HCC)   Chronic anticoagulation   Bipolar disorder, unspecified (HCC)   Obstructive sleep apnea on CPAP   History of pulmonary embolism   Normocytic  anemia   Hematoma of left thigh   Left thigh pain secondary to pyomyositis Left thigh No obvious evidence of sepsis or infection at this time.  Recently had spontaneous hematoma due to Supratherapeutic INR, Now coming with concerns of pyomyositis.  Per IR and orthopedic, no discrete collection to drain and does not recommend surgery.  ID has cleared the patient for discharge.  Cultures remain negative.  We will follow-up outpatient.    Normocytic anemia Hemoglobin currently stable   Bipolar disorder Continue home medications at this time   History of DVT/PE on chronic anticoagulation Eliquis is on hold, on heparin drip.  Still having significant left calf pain therefore ultrasound/Doppler negative for DVT   OSA on CPAP -Continue CPAP at night   Discussed with Dr Miles Costain from IR and Dr Susa Simmonds from Ortho.    Assessment and Plan: No notes have been filed under this hospital service. Service: Hospitalist      Body mass index is 32.49 kg/m.       Discharge Diagnoses:  Principal Problem:   Pyomyositis Active Problems:   Leukocytosis   SIRS (systemic inflammatory response syndrome) (HCC)   Chronic anticoagulation   Bipolar disorder, unspecified (HCC)   Obstructive sleep apnea on CPAP   History of pulmonary embolism   Normocytic anemia   Hematoma of left thigh      Consultations: Infectious disease Case discussed with IR and orthopedic as mentioned above  Subjective: Feeling well.  Still has some left thigh pain and spasm at times especially with ambulation otherwise remains afebrile  Discharge Exam: Vitals:   12/25/21 0453 12/25/21 0755  BP: (!) 125/92 (!) 123/93  Pulse: (!) 101 95  Resp: 20 16  Temp: 97.8 F (36.6 C) 98.4 F (36.9 C)  SpO2: 97% 99%   Vitals:   12/24/21 1604 12/24/21 2056 12/25/21 0453 12/25/21 0755  BP: 124/86 (!) 142/106 (!) 125/92 (!) 123/93  Pulse: 100 (!) 106 (!) 101 95  Resp: Temp: 99 F (37.2 C) 98.2 F (36.8 C)  97.8 F (36.6 C) 98.4 F (36.9 C)  TempSrc: Oral Oral Oral Oral  SpO2: 99% 98% 97% 99%  Weight:      Height:        General: Pt is alert, awake, not in acute distress Cardiovascular: RRR, S1/S2 +, no rubs, no gallops Respiratory: CTA bilaterally, no wheezing, no rhonchi Abdominal: Soft, NT, ND, bowel sounds + Extremities: no edema, no cyanosis.  Slight tenderness and swelling of the left thigh compared to right but no obvious evidence of infection  Discharge Instructions  Discharge Instructions     Ambulatory referral to Physical Therapy   Complete by: As directed    Iontophoresis - 4 mg/ml of dexamethasone: No   T.E.N.S. Unit Evaluation and Dispense as Indicated: No      Allergies as of 12/25/2021       Reactions   Lyrica [pregabalin] Other (See Comments)   Hallucinations        Medication List     STOP taking these medications    oxyCODONE HCl 7.5 MG Tabs       TAKE these medications    albuterol 108 (90 Base) MCG/ACT inhaler Commonly known as: VENTOLIN HFA Inhale 1-2 puffs into the lungs every 6 (six) hours as needed for wheezing or shortness of breath.   apixaban 5 MG Tabs tablet Commonly known as: ELIQUIS Take 1 tablet (5 mg total) by mouth 2 (two) times daily.   cyclobenzaprine 10 MG tablet Commonly known as: FLEXERIL Take 1 tablet (10 mg total) by mouth 3 (three) times daily as needed for muscle spasms.   diclofenac Sodium 1 % Gel Commonly known as: VOLTAREN Apply 4 g topically 4 (four) times daily as needed (pain).   dicyclomine 10 MG capsule Commonly known as: BENTYL Take 10 mg by mouth 3 (three) times daily as needed for spasms.   doxepin 75 MG capsule Commonly known as: SINEQUAN Take 75 mg by mouth at bedtime.   gabapentin 300 MG capsule Commonly known as: NEURONTIN Take 1 capsule (300 mg total) by mouth 3 (three) times daily for 14 days.   HYDROcodone-acetaminophen 7.5-325 MG tablet Commonly known as: NORCO Take 0.5-1 tablets by  mouth every 8 (eight) hours as needed for moderate pain or severe pain. What changed:  when to take this Another medication with the same name was removed. Continue taking this medication, and follow the directions you see here.   lithium 600 MG capsule Take 600 mg by mouth at bedtime.   lithium carbonate 450 MG CR tablet Commonly known as: ESKALITH Take 450 mg by mouth daily. Take 1 tablet in the morning.   mirtazapine 45 MG tablet Commonly known as: REMERON Take 45 mg by mouth at bedtime.   QUEtiapine 400 MG 24 hr tablet Commonly known as: SEROQUEL XR Take 800 mg by mouth at bedtime.   tiZANidine 4 MG tablet Commonly known as: ZANAFLEX Take 4 mg by mouth at bedtime.               Durable Medical Equipment  (From admission, onward)           Start  Ordered   12/25/21 0958  For home use only DME Shower stool  Once        12/25/21 0957            Follow-up Information     [facility] Follow up in 1 week(s).   Specialty: Internal Medicine Contact information: [address] [place] [address] [phone]         [facility] Schedule an appointment as soon as possible for a visit.   Specialty: Rehabilitation Contact information: [address] [id number] mc [place] [address] [phone]               Allergies  Allergen Reactions   Lyrica [Pregabalin] Other (See Comments)    Hallucinations    You were cared for by a hospitalist during your hospital stay. If you have any questions about your discharge medications or the care you received while you were in the hospital after you are discharged, you can call the unit and asked to speak with the hospitalist on call if the hospitalist that took care of you is not available. Once you are discharged, your primary care physician will handle any further medical issues. Please note that no refills for any discharge  medications will be authorized once you are discharged, as it is imperative that you return to your primary care physician (or establish a relationship with a primary care physician if you do not have one) for your aftercare needs so that they can reassess your need for medications and monitor your lab values.   Procedures/Studies: VAS [place] LOWER EXTREMITY VENOUS (DVT)  Result Date: 12/25/2021  Lower Venous DVT Study Patient Name:  Justin [name]  Date of Exam:   12/24/2021 Medical Rec #: [id number]       Accession #:    [id number] Date of Birth: [birth year]       Patient Gender: M Patient Age:   1 years Exam Location:  [facility] Procedure:      VAS [place] LOWER EXTREMITY VENOUS (DVT) Referring Phys: Stephania Fragmin --------------------------------------------------------------------------------  Indications: Pain- patient endorses burning pain with ambulation which he states is unchanged since previous examination. Provider concern for "knot" on lateral aspect of the thigh. Other Indications: History of peroneal and gastrocnemius DVT in the right calf                    on 09/23/2021 which appeared chronic on 10/27/2021 and was                    resolved on examination on 11/30/2021. Anticoagulation: Heparin. Comparison Study: Multiple prior examinations. Most recent, 12/16/2021 left                   lower extremity venous study performed for burning pain with                   ambulation was negative for DVT.                    12/21/2021 CTA of the left leg showed no evidence of DVT. "As                   compared to the study of 12/09/2021, large collection in the                   vastus lateralis muscle appears more organized and is more  complex, with numerous enhancing septations and multiple tiny                   fluid pockets. The appearance is most suggestive of a                   pyomyositis although component of hematoma could still be                   present." Performing  Technologist: Jean Rosenthal RDMS, RVT  Examination Guidelines: A complete evaluation includes B-mode imaging, spectral Doppler, color Doppler, and power Doppler as needed of all accessible portions of each vessel. Bilateral testing is considered an integral part of a complete examination. Limited examinations for reoccurring indications may be performed as noted. The reflux portion of the exam is performed with the patient in reverse Trendelenburg.  +-----+---------------+---------+-----------+----------+--------------+ RIGHTCompressibilityPhasicitySpontaneityPropertiesThrombus Aging +-----+---------------+---------+-----------+----------+--------------+ CFV  Full           Yes      Yes                                 +-----+---------------+---------+-----------+----------+--------------+   +---------+---------------+---------+-----------+----------+--------------+ LEFT     CompressibilityPhasicitySpontaneityPropertiesThrombus Aging +---------+---------------+---------+-----------+----------+--------------+ CFV      Full           Yes      Yes                                 +---------+---------------+---------+-----------+----------+--------------+ SFJ      Full                                                        +---------+---------------+---------+-----------+----------+--------------+ FV Prox  Full                                                        +---------+---------------+---------+-----------+----------+--------------+ FV Mid   Full                                                        +---------+---------------+---------+-----------+----------+--------------+ FV DistalFull                                                        +---------+---------------+---------+-----------+----------+--------------+ PFV      Full                                                         +---------+---------------+---------+-----------+----------+--------------+ POP      Full           Yes  Yes                                 +---------+---------------+---------+-----------+----------+--------------+ PTV      Full                                                        +---------+---------------+---------+-----------+----------+--------------+ PERO     Full                                                        +---------+---------------+---------+-----------+----------+--------------+ Gastroc  Full                                                        +---------+---------------+---------+-----------+----------+--------------+     Summary: RIGHT: - No evidence of common femoral vein obstruction.  LEFT: - Findings appear essentially unchanged compared to previous examination on 12/16/2021.  - There is no evidence of deep vein thrombosis in the lower extremity.  - A cystic structure is found in the popliteal fossa. 2.9 cm.  - No masses visualized on today's examination; however, lateral thigh mass visualized on CTA on 12/21/2021 is outside of the venous pathway and the scope of this examination. Soft tissue imaging may be warranted as clinically indicated.  *See table(s) above for measurements and observations. Electronically signed by Sherald Hess MD on 12/25/2021 at 10:44:06 AM.    Final    CT EXTREMITY LOWER LEFT W CONTRAST  Result Date: 12/22/2021 CLINICAL DATA:  Worsening swelling in the left thigh. EXAM: CT ANGIOGRAPHY OF THE LEFT LOWER EXTREMITY; CT OF THE LEFT LOWER EXTREMITY WITH CONTRAST TECHNIQUE: Multidetector CTA imaging of the entire left lower extremity was performed using the standard protocol during bolus administration of intravenous contrast. Multiplanar CT image reconstructions and MIPs were obtained to evaluate the vascular anatomy. Subsequently, multidetector CT imaging of the entire left lower extremity was repeated and the combined  arterial and venous phase using the standard protocol, with multiplanar CT image reconstructions including MIP RADIATION DOSE REDUCTION: This exam was performed according to the departmental dose-optimization program which includes automated exposure control, adjustment of the mA and/or kV according to patient size and/or use of iterative reconstruction technique. CONTRAST:  80mL OMNIPAQUE IOHEXOL 350 MG/ML SOLN COMPARISON:  Left thigh and femoral CT 12/09/2021, with contrast. FINDINGS: CT ANGIOGRAPHY LEFT LOWER EXTREMITY FINDINGS Inflow: Only the most distal extent of the left common iliac artery is included in the scan and is patent. The internal and external iliac arteries widely patent. No aneurysm or dissection is seen. There are scattered trace calcifications in the left internal iliac artery. Outflow: There are no visible plaques, stenoses, dissections or aneurysms of the left common femoral, profunda femoral, medial and lateral circumflex femoral and superficial femoral arteries. The popliteal artery is also normal. Runoff: Patent three-vessel runoff into the foot. There are scattered trace calcifications beginning to develop in the anterior and posterior tibial arteries. Left hemipelvis: Mild prostatomegaly. No  significant thickening of the visualized bladder. Left hemipelvis demonstrates no free fluid, free hemorrhage, free air or new adenopathy. Again noted is a mildly enlarged left external iliac chain node measuring 3.5 x 1.2 cm. There are slightly prominent left inguinal chain nodes up to 1.1 cm in short axis which were not previously enlarged. There is no incarcerated hernia. Both testicles are in the scrotal sac. CT LEFT LOWER EXTREMITY WITH CONTRAST FINDINGS Vascular: There is no appreciable deep vein thrombosis. Bones/joints/cartilage: No fracture, destructive bone lesion or aggressive periostitis is seen in the left lower extremity. In the anterior superior left femoral head, there is a  well-circumscribed, chronic appearing lesion compatible with osteonecrosis measuring 2.3 x 1.4 x 1.2 cm, with a thin sclerotic border without evidence of subsidence of the overlying cortex, and a homogeneous ground-glass matrix. There is early nonerosive degenerative arthrosis of the medial femorotibial joint and lateral patellofemoral joint in knee and trace suprapatellar bursal fluid probably physiologic, without popliteal cyst. There is no significant arthrosis in the foot and ankle. Ligaments: Suboptimally assessed by CT. Muscles and tendons: There is a large complex heterogeneous collection in the vastus lateralis, primarily the outer aspect of the muscle, characterized by innumerable rim enhancing septations and tiny fluid locules. Maximum measurements are 21 cm craniocaudal, 3.3 cm transversely and 8.9 cm AP. 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 There is no appreciable collection in the remainder of the thigh muscles but there is edema in the adjacent deep soft tissue planes in the anterior thigh compartment. There is no soft tissue gas but certainly early necrotizing fasciitis is not excluded. The musculature and deep soft tissues of the gluteal compartments, dorsal thigh compartment, and foreleg are unremarkable as are the visualized tendons. Soft tissues: Subcutaneous edema continues to be seen in the thigh and extends into the foreleg. There is scattered nonlocalizing fluid along the fat muscle interfaces but no subcutaneous abscess or soft tissue gas. A 1.7 cm soft tissue nodule is stably redemonstrated in the left ischioanal fossa, nonspecific. This is best seen on series 4 axial 85 of the CT lower extremity study. Review of the MIP images confirms the above findings. IMPRESSION: 1. No arterial stenosis.  No findings of deep venous thrombosis. 2. Trace calcific plaques in the left internal iliac artery and scattered along the left anterior and posterior tibial arteries. 3. As compared to  the study of 12/09/2021, large collection in the vastus lateralis muscle appears more organized and is more complex, with numerous enhancing septations and multiple tiny fluid pockets. The appearance is most suggestive of a pyomyositis although component of hematoma could still be present. No soft tissue gas is seen but there is edema in the anterior compartment deep soft tissue planes which can be seen with fasciitis. 4. Generalized left lower extremity edema. No subcutaneous abscess or soft tissue gas. 5. 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 No overlying cortical subsidence or significant findings of secondary hip DJD. 6. 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 7. Stable 1.7 cm soft tissue nodule in the left ischioanal fossa subcutaneous fat. Nonspecific. Follow-up as indicated. 8. Mildly enlarged lymph nodes in the left external iliac and inguinal chains probably reactive. 9. Mild prostatomegaly. Electronically Signed   By: Almira Bar M.D.   On: 12/22/2021 00:51   CT ANGIO LOW EXTREM LEFT W &/OR WO CONTRAST  Result Date: 12/22/2021 CLINICAL DATA:  Worsening swelling in the left thigh. EXAM: CT ANGIOGRAPHY OF THE LEFT LOWER EXTREMITY; CT OF THE  LEFT LOWER EXTREMITY WITH CONTRAST TECHNIQUE: Multidetector CTA imaging of the entire left lower extremity was performed using the standard protocol during bolus administration of intravenous contrast. Multiplanar CT image reconstructions and MIPs were obtained to evaluate the vascular anatomy. Subsequently, multidetector CT imaging of the entire left lower extremity was repeated and the combined arterial and venous phase using the standard protocol, with multiplanar CT image reconstructions including MIP RADIATION DOSE REDUCTION: This exam was performed according to the departmental dose-optimization program which includes automated exposure control, adjustment of the mA and/or kV according to patient size and/or use of iterative reconstruction technique.  CONTRAST:  80mL OMNIPAQUE IOHEXOL 350 MG/ML SOLN COMPARISON:  Left thigh and femoral CT 12/09/2021, with contrast. FINDINGS: CT ANGIOGRAPHY LEFT LOWER EXTREMITY FINDINGS Inflow: Only the most distal extent of the left common iliac artery is included in the scan and is patent. The internal and external iliac arteries widely patent. No aneurysm or dissection is seen. There are scattered trace calcifications in the left internal iliac artery. Outflow: There are no visible plaques, stenoses, dissections or aneurysms of the left common femoral, profunda femoral, medial and lateral circumflex femoral and superficial femoral arteries. The popliteal artery is also normal. Runoff: Patent three-vessel runoff into the foot. There are scattered trace calcifications beginning to develop in the anterior and posterior tibial arteries. Left hemipelvis: Mild prostatomegaly. No significant thickening of the visualized bladder. Left hemipelvis demonstrates no free fluid, free hemorrhage, free air or new adenopathy. Again noted is a mildly enlarged left external iliac chain node measuring 3.5 x 1.2 cm. There are slightly prominent left inguinal chain nodes up to 1.1 cm in short axis which were not previously enlarged. There is no incarcerated hernia. Both testicles are in the scrotal sac. CT LEFT LOWER EXTREMITY WITH CONTRAST FINDINGS Vascular: There is no appreciable deep vein thrombosis. Bones/joints/cartilage: No fracture, destructive bone lesion or aggressive periostitis is seen in the left lower extremity. In the anterior superior left femoral head, there is a well-circumscribed, chronic appearing lesion compatible with osteonecrosis measuring 2.3 x 1.4 x 1.2 cm, with a thin sclerotic border without evidence of subsidence of the overlying cortex, and a homogeneous ground-glass matrix. There is early nonerosive degenerative arthrosis of the medial femorotibial joint and lateral patellofemoral joint in knee and trace suprapatellar  bursal fluid probably physiologic, without popliteal cyst. There is no significant arthrosis in the foot and ankle. Ligaments: Suboptimally assessed by CT. Muscles and tendons: There is a large complex heterogeneous collection in the vastus lateralis, primarily the outer aspect of the muscle, characterized by innumerable rim enhancing septations and tiny fluid locules. Maximum measurements are 21 cm craniocaudal, 3.3 cm transversely and 8.9 cm AP. 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 There is no appreciable collection in the remainder of the thigh muscles but there is edema in the adjacent deep soft tissue planes in the anterior thigh compartment. There is no soft tissue gas but certainly early necrotizing fasciitis is not excluded. The musculature and deep soft tissues of the gluteal compartments, dorsal thigh compartment, and foreleg are unremarkable as are the visualized tendons. Soft tissues: Subcutaneous edema continues to be seen in the thigh and extends into the foreleg. There is scattered nonlocalizing fluid along the fat muscle interfaces but no subcutaneous abscess or soft tissue gas. A 1.7 cm soft tissue nodule is stably redemonstrated in the left ischioanal fossa, nonspecific. This is best seen on series 4 axial 85 of the CT lower extremity study. Review of the MIP  images confirms the above findings. IMPRESSION: 1. No arterial stenosis.  No findings of deep venous thrombosis. 2. Trace calcific plaques in the left internal iliac artery and scattered along the left anterior and posterior tibial arteries. 3. As compared to the study of 12/09/2021, large collection in the vastus lateralis muscle appears more organized and is more complex, with numerous enhancing septations and multiple tiny fluid pockets. The appearance is most suggestive of a pyomyositis although component of hematoma could still be present. No soft tissue gas is seen but there is edema in the anterior compartment deep soft  tissue planes which can be seen with fasciitis. 4. Generalized left lower extremity edema. No subcutaneous abscess or soft tissue gas. 5. 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 No overlying cortical subsidence or significant findings of secondary hip DJD. 6. 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 7. Stable 1.7 cm soft tissue nodule in the left ischioanal fossa subcutaneous fat. Nonspecific. Follow-up as indicated. 8. Mildly enlarged lymph nodes in the left external iliac and inguinal chains probably reactive. 9. Mild prostatomegaly. Electronically Signed   By: Telford Nab M.D.   On: 12/22/2021 00:51   VAS [place] LOWER EXTREMITY VENOUS (DVT) (7a-7p)  Result Date: 12/17/2021  Lower Venous DVT Study Patient Name:  Justin [name]  Date of Exam:   12/16/2021 Medical Rec #: [id number]       Accession #:    [id number] Date of Birth: March 04, 1966       Patient Gender: M Patient Age:   50 years Exam Location:  [facility] Procedure:      VAS [place] LOWER EXTREMITY VENOUS (DVT) Referring Phys: AMJAD ALI --------------------------------------------------------------------------------  Indications: Edema.  Comparison Study: prior 11/30/21 Performing Technologist: Archie Patten RVS  Examination Guidelines: A complete evaluation includes B-mode imaging, spectral Doppler, color Doppler, and power Doppler as needed of all accessible portions of each vessel. Bilateral testing is considered an integral part of a complete examination. Limited examinations for reoccurring indications may be performed as noted. The reflux portion of the exam is performed with the patient in reverse Trendelenburg.  +-----+---------------+---------+-----------+----------+--------------+ RIGHTCompressibilityPhasicitySpontaneityPropertiesThrombus Aging +-----+---------------+---------+-----------+----------+--------------+ CFV  Full           Yes      Yes                                  +-----+---------------+---------+-----------+----------+--------------+   +---------+---------------+---------+-----------+----------+--------------+ LEFT     CompressibilityPhasicitySpontaneityPropertiesThrombus Aging +---------+---------------+---------+-----------+----------+--------------+ CFV      Full           Yes      Yes                                 +---------+---------------+---------+-----------+----------+--------------+ SFJ      Full                                                        +---------+---------------+---------+-----------+----------+--------------+ FV Prox  Full                                                        +---------+---------------+---------+-----------+----------+--------------+  FV Mid   Full                                                        +---------+---------------+---------+-----------+----------+--------------+ FV DistalFull                                                        +---------+---------------+---------+-----------+----------+--------------+ PFV      Full                                                        +---------+---------------+---------+-----------+----------+--------------+ POP      Full           Yes      Yes                                 +---------+---------------+---------+-----------+----------+--------------+ PTV      Full                                                        +---------+---------------+---------+-----------+----------+--------------+ PERO     Full           Yes      Yes                                 +---------+---------------+---------+-----------+----------+--------------+     Summary: RIGHT: - No evidence of common femoral vein obstruction.  LEFT: - There is no evidence of deep vein thrombosis in the lower extremity.  - No cystic structure found in the popliteal fossa.  *See table(s) above for measurements and observations. Electronically signed  by Waverly Ferrari MD on 12/17/2021 at 6:48:25 AM.    Final    CT ABDOMEN PELVIS W CONTRAST  Result Date: 12/09/2021 CLINICAL DATA:  Back and flank pain with supratherapeutic INR about for retroperitoneal bleed EXAM: CT ABDOMEN AND PELVIS WITH CONTRAST TECHNIQUE: Multidetector CT imaging of the abdomen and pelvis was performed using the standard protocol following bolus administration of intravenous contrast. RADIATION DOSE REDUCTION: This exam was performed according to the departmental dose-optimization program which includes automated exposure control, adjustment of the mA and/or kV according to patient size and/or use of iterative reconstruction technique. CONTRAST:  OMNIPAQUE IOHEXOL 350 MG/ML SOLN COMPARISON:  CT 09/06/2020 FINDINGS: Lower chest: Cardiomegaly.  No pericardial effusion. Hepatobiliary: Hepatic steatosis. No focal liver abnormality is seen. No gallstones, gallbladder wall thickening, or biliary dilatation. Pancreas: Unremarkable. No pancreatic ductal dilatation or surrounding inflammatory changes. Spleen: Normal in size without focal abnormality. Adrenals/Urinary Tract: Adrenal glands are unremarkable. Kidneys are normal, without renal calculi, focal lesion, or hydronephrosis. Bladder is unremarkable. Stomach/Bowel: Stomach is within normal limits. No evidence of bowel wall thickening, distention, or inflammatory changes. Vascular/Lymphatic: IVC  filter. Mild aortic atherosclerosis. No abdominopelvic lymphadenopathy. Reproductive: Unremarkable. Other: Nodularity in the anterior subcutaneous abdominal fat likely related to injections. Spinal cord stimulator with leads at the level of T7. Musculoskeletal: Partially visualized heterogenous fluid within the left anterior thigh compartment musculature. Additional 1.8 cm soft tissue nodule within the left ischioanal fossa. IMPRESSION: Partially visualized left anterior compartment intramuscular hematoma. Additional partially visualized  nodule in the left ischioanal fossa may represent an additional hematoma. See separate report for CT femur for additional details. Otherwise no acute abnormality in the abdomen or pelvis. Hepatic steatosis. Cardiomegaly. Aortic Atherosclerosis (ICD10-I70.0). Electronically Signed   By: Minerva Fester M.D.   On: 12/09/2021 01:35   CT FEMUR LEFT W CONTRAST  Result Date: 12/09/2021 CLINICAL DATA:  Pain and swelling on Coumadin; eval for hematoma EXAM: CT OF THE LOWER LEFT EXTREMITY WITH CONTRAST TECHNIQUE: Multidetector CT imaging of the lower left extremity was performed according to the standard protocol following intravenous contrast administration. RADIATION DOSE REDUCTION: This exam was performed according to the departmental dose-optimization program which includes automated exposure control, adjustment of the mA and/or kV according to patient size and/or use of iterative reconstruction technique. CONTRAST:  OMNIPAQUE IOHEXOL 350 MG/ML SOLN COMPARISON:  None Available. FINDINGS: Bones/Joint/Cartilage No acute fracture. Ligaments Suboptimally assessed by CT. Muscles and Tendons Heterogenous poorly defined fluid collection within the left anterior compartment musculature greatest laterally is nonspecific but compatible with intramuscular hematoma. Exam is not optimized to evaluate for active bleeding. Soft tissues Subcutaneous edema within the lower thigh. 1.8 cm soft tissue nodule within the subcutaneous fat of the left ischioanal fossa. IMPRESSION: Ill-defined fluid collection compatible with large intramuscular hematoma within the left anterior thigh compartment musculature. Exam is not optimized to evaluate for active bleeding. If there is concern for active bleeding recommend CT angiogram of the left lower extremity. Soft tissue nodule within the left ischioanal fossa measuring 1.8 cm is indeterminate but may represent an additional hematoma. Electronically Signed   By: Minerva Fester M.D.   On:  12/09/2021 01:28   DG Chest 2 View  Result Date: 12/08/2021 CLINICAL DATA:  Dyspnea EXAM: CHEST - 2 VIEW COMPARISON:  11/04/2021 FINDINGS: The heart size and mediastinal contours are within normal limits. Both lungs are clear. The visualized skeletal structures are unremarkable. Dorsal column stimulator leads are again noted, unchanged. IMPRESSION: No active cardiopulmonary disease. Electronically Signed   By: Helyn Numbers M.D.   On: 12/08/2021 22:00   DG Lumbar Spine Complete  Result Date: 12/01/2021 CLINICAL DATA:  Low back pain EXAM: LUMBAR SPINE - COMPLETE 4+ VIEW COMPARISON:  None Available. FINDINGS: 5 nonrib bearing lumbar-type vertebral bodies. Vertebral body heights are maintained. No acute fracture. No static listhesis. No spondylolysis. Posterior lumbar interbody fusion at L5-S1. Mild degenerative disease with disc height loss at L4-5. Insert until note made of a spinal stimulator. IVC filter with the tip at the level of L2. SI joints are unremarkable. IMPRESSION: 1. Posterior lumbar interbody fusion at L5-S1. 2. Mild degenerative disease with disc height loss at L4-5. Electronically Signed   By: Elige Ko M.D.   On: 12/01/2021 12:22   VAS [place] LOWER EXTREMITY VENOUS (DVT) (7a-7p)  Result Date: 11/30/2021  Lower Venous DVT Study Patient Name:  Justin [name]  Date of Exam:   11/30/2021 Medical Rec #: [id number]       Accession #:    [id number] Date of Birth: May 14, 1965       Patient Gender: M Patient Age:  56 years Exam Location:  [facility] Procedure:      VAS [place] LOWER EXTREMITY VENOUS (DVT) Referring Phys: HALEY SAGE --------------------------------------------------------------------------------  Indications: Right lower extremity pain, history of calf DVT.  Anticoagulation: Coumadin. Comparison Study: 10-12-2021 Prior right lower extremity venous study was                   positive for acute peroneal DVT.                    Follow up performed 10-27-2021. Performing  Technologist: Jean Rosenthal RDMS, RVT  Examination Guidelines: A complete evaluation includes B-mode imaging, spectral Doppler, color Doppler, and power Doppler as needed of all accessible portions of each vessel. Bilateral testing is considered an integral part of a complete examination. Limited examinations for reoccurring indications may be performed as noted. The reflux portion of the exam is performed with the patient in reverse Trendelenburg.  +---------+---------------+---------+-----------+----------+--------------+ RIGHT    CompressibilityPhasicitySpontaneityPropertiesThrombus Aging +---------+---------------+---------+-----------+----------+--------------+ CFV      Full           Yes      Yes                                 +---------+---------------+---------+-----------+----------+--------------+ SFJ      Full                                                        +---------+---------------+---------+-----------+----------+--------------+ FV Prox  Full                                                        +---------+---------------+---------+-----------+----------+--------------+ FV Mid   Full                                                        +---------+---------------+---------+-----------+----------+--------------+ FV DistalFull                                                        +---------+---------------+---------+-----------+----------+--------------+ PFV      Full                                                        +---------+---------------+---------+-----------+----------+--------------+ POP      Full           Yes      Yes                                 +---------+---------------+---------+-----------+----------+--------------+ PTV      Full                                                        +---------+---------------+---------+-----------+----------+--------------+  PERO     Full                                                         +---------+---------------+---------+-----------+----------+--------------+ Gastroc  Full                                                        +---------+---------------+---------+-----------+----------+--------------+   +----+---------------+---------+-----------+----------+--------------+ LEFTCompressibilityPhasicitySpontaneityPropertiesThrombus Aging +----+---------------+---------+-----------+----------+--------------+ CFV Full           Yes      Yes                                 +----+---------------+---------+-----------+----------+--------------+     Summary: RIGHT: - There is no evidence of deep vein thrombosis in the lower extremity.  - No cystic structure found in the popliteal fossa.  LEFT: - No evidence of common femoral vein obstruction.  *See table(s) above for measurements and observations. Electronically signed by Coral Else MD on 11/30/2021 at 11:54:37 PM.    Final      The results of significant diagnostics from this hospitalization (including imaging, microbiology, ancillary and laboratory) are listed below for reference.     Microbiology: Recent Results (from the past 240 hour(s))  Blood culture (routine x 2)     Status: None (Preliminary result)   Collection Time: 12/22/21  1:20 AM   Specimen: BLOOD  Result Value Ref Range Status   Specimen Description   Final    BLOOD BLOOD RIGHT FOREARM Performed at [facility], [address], [place], [place] [address]    Special Requests   Final    BOTTLES DRAWN AEROBIC AND ANAEROBIC Blood Culture results may not be optimal due to an excessive volume of blood received in culture bottles Performed at [facility], [address] [place], [place] [address]    Culture   Final    NO GROWTH 2 DAYS Performed at [facility], [address] [place], [place] [address]    Report Status PENDING  Incomplete     Labs: BNP (last 3  results) Recent Labs    03/19/21 1028 12/08/21 2007  BNP 59.0 23.4   Basic Metabolic Panel: Recent Labs  Lab 12/21/21 2248 12/22/21 1820 12/24/21 0248 12/24/21 2218  NA 141 139 139 137  K 4.0 4.4 4.1 3.7  CL 106 108 107 108  CO2 GLUCOSE 136* 173* 154* 157*  BUN CREATININE 1.05 1.19 1.17 1.21  CALCIUM 8.9 8.6* 9.1 8.8*  MG  --   --  2.3 2.1   Liver Function Tests: Recent Labs  Lab 12/22/21 1820  AST 18  ALT 22  ALKPHOS 79  BILITOT 0.8  PROT 6.6  ALBUMIN 3.5   No results for input(s): "LIPASE", "AMYLASE" in the last 168 hours. No results for input(s): "AMMONIA" in the last 168 hours. CBC: Recent Labs  Lab 12/21/21 2248 12/22/21 1820 12/23/21 0203 12/24/21 0248 12/24/21 2218  WBC 11.9* 13.0* 11.6* 9.6 10.4  NEUTROABS 8.9*  --   --   --   --  HGB 9.7* 9.9* 10.1* 10.1* 10.2*  HCT 29.9* 30.7* 31.0* 30.3* 31.7*  MCV 89.3 90.6 88.8 88.9 90.8  PLT 395 396 334 347 351   Cardiac Enzymes: Recent Labs  Lab 12/22/21 1820  CKTOTAL 90   BNP: Invalid input(s): "POCBNP" CBG: No results for input(s): "GLUCAP" in the last 168 hours. D-Dimer No results for input(s): "DDIMER" in the last 72 hours. Hgb A1c No results for input(s): "HGBA1C" in the last 72 hours. Lipid Profile No results for input(s): "CHOL", "HDL", "LDLCALC", "TRIG", "CHOLHDL", "LDLDIRECT" in the last 72 hours. Thyroid function studies No results for input(s): "TSH", "T4TOTAL", "T3FREE", "THYROIDAB" in the last 72 hours.  Invalid input(s): "FREET3" Anemia work up No results for input(s): "VITAMINB12", "FOLATE", "FERRITIN", "TIBC", "IRON", "RETICCTPCT" in the last 72 hours. Urinalysis    Component Value Date/Time   COLORURINE STRAW (A) 09/06/2020 1629   APPEARANCEUR CLEAR 09/06/2020 1629   LABSPEC 1.005 09/06/2020 1629   PHURINE 6.0 09/06/2020 1629   GLUCOSEU NEGATIVE 09/06/2020 1629   HGBUR NEGATIVE 09/06/2020 1629   BILIRUBINUR NEGATIVE 09/06/2020 1629   KETONESUR  NEGATIVE 09/06/2020 1629   PROTEINUR NEGATIVE 09/06/2020 1629   UROBILINOGEN 0.2 04/04/2014 2056   NITRITE NEGATIVE 09/06/2020 1629   LEUKOCYTESUR NEGATIVE 09/06/2020 1629   Sepsis Labs Recent Labs  Lab 12/22/21 1820 12/23/21 0203 12/24/21 0248 12/24/21 2218  WBC 13.0* 11.6* 9.6 10.4   Microbiology Recent Results (from the past 240 hour(s))  Blood culture (routine x 2)     Status: None (Preliminary result)   Collection Time: 12/22/21  1:20 AM   Specimen: BLOOD  Result Value Ref Range Status   Specimen Description   Final    BLOOD BLOOD RIGHT FOREARM Performed at [facility], [address] [place], [place] [address]    Special Requests   Final    BOTTLES DRAWN AEROBIC AND ANAEROBIC Blood Culture results may not be optimal due to an excessive volume of blood received in culture bottles Performed at [facility], [address], [place], [place] [address]    Culture   Final    NO GROWTH 2 DAYS Performed at [facility], [address] [place], [place] [address]    Report Status PENDING  Incomplete     Time coordinating discharge:  I have spent 35 minutes face to face with the patient and on the ward discussing the patients care, assessment, plan and disposition with other care givers. >50% of the time was devoted counseling the patient about the risks and benefits of treatment/Discharge disposition and coordinating care.   SIGNED:   Dimple Nanas, MD  [facility] 12/25/2021, 12:35 PM   If 7PM-7AM, please contact night-coverage

## 2021-12-25 NOTE — TOC Initial Note (Signed)
Transition of Care [facility]) - Initial/Assessment Note    Patient Details  Name: Justin [name] MRN: [id number] Date of Birth: 1965-11-16  Transition of Care [facility]) CM/SW Contact:    Marilu Favre, RN Phone Number: 12/25/2021, 11:41 AM  Clinical Narrative:                 Patient from home.   Requesting shower chair be delivered to home. Mardene Celeste with [employer] aware.   NCM confirmed face sheet information.   Patient has transportation for OP PT. Discussed locations preferred [place], order entered for MD signature   Expected Discharge Plan: Home/Self Care Barriers to Discharge: No Barriers Identified   Patient Goals and CMS Choice Patient states their goals for this hospitalization and ongoing recovery are:: to return to home CMS Medicare.gov Compare Post Acute Care list provided to:: Patient    Expected Discharge Plan and Services Expected Discharge Plan: Home/Self Care   Discharge Planning Services: CM Consult Post Acute Care Choice: Durable Medical Equipment Living arrangements for the past 2 months: Apartment Expected Discharge Date: 12/25/21               DME Arranged: Shower stool DME Agency: [employer] Date DME Agency Contacted: 12/25/21 Time DME Agency Contacted: 68 Representative spoke with at DME Agency: Mardene Celeste HH Arranged: NA          Prior Living Arrangements/Services Living arrangements for the past 2 months: Apartment Lives with:: Self Patient language and need for interpreter reviewed:: Yes Do you feel safe going back to the place where you live?: Yes      Need for Family Participation in Patient Care: Yes (Comment) Care giver support system in place?: Yes (comment) Current home services: DME Criminal Activity/Legal Involvement Pertinent to Current Situation/Hospitalization: No - Comment as needed  Activities of Daily Living Home Assistive Devices/Equipment: None ADL Screening (condition at time of admission) Patient's cognitive  ability adequate to safely complete daily activities?: Yes Is the patient deaf or have difficulty hearing?: No Does the patient have difficulty seeing, even when wearing glasses/contacts?: No Does the patient have difficulty concentrating, remembering, or making decisions?: No Patient able to express need for assistance with ADLs?: Yes Does the patient have difficulty dressing or bathing?: No Independently performs ADLs?: Yes (appropriate for developmental age) Does the patient have difficulty walking or climbing stairs?: No Weakness of Legs: Left Weakness of Arms/Hands: None  Permission Sought/Granted   Permission granted to share information with : No              Emotional Assessment Appearance:: Appears stated age Attitude/Demeanor/Rapport: Engaged Affect (typically observed): Accepting Orientation: : Oriented to Situation, Oriented to  Time, Oriented to Place, Oriented to Self Alcohol / Substance Use: Not Applicable Psych Involvement: No (comment)  Admission diagnosis:  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] Hematoma of left thigh, initial encounter [S70.12XA] Myositis of left lower extremity, unspecified myositis type [M60.9] Patient Active Problem List   Diagnosis Date Noted   Hematoma of left thigh    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   Leukocytosis 12/22/2021   SIRS (systemic inflammatory response syndrome) (HCC) 12/22/2021   Normocytic anemia 12/22/2021   Hematoma 12/09/2021   Acute deep vein thrombosis (DVT) of right peroneal vein ([place]) 10/19/2021   History of pulmonary embolism 10/19/2021   Status post lumbar spinal fusion 01/03/2018   Lumbar stenosis 12/23/2017   Spondylolisthesis, lumbar region 06/25/2017   Achilles tendinitis of left lower extremity 12/07/2016   Achilles tendinitis, left leg 05/17/2016   Obstructive sleep apnea on  CPAP 03/07/2015   Post-operative state 03/07/2015   Achilles rupture, left 10/21/2014   OSA (obstructive sleep apnea) 04/23/2013   Bipolar disorder,  unspecified ([place]) 01/07/2013   Sinus tachycardia 01/07/2013   Headache(784.0) 01/07/2013   SDH (subdural hematoma) ([place]) 01/04/2013   H/O PE 01/04/2013   Chronic anticoagulation 01/04/2013   Warfarin-induced coagulopathy ([place]) 01/04/2013   Acute sinusitis 01/04/2013   PCP:  Beaulah Corin, [facility]:   [facility] [address] [place] [address] Phone: [phone] Fax: [phone]  [facility] [phone] - [place], [address] [place] [place] [address] Phone: [phone] Fax: [phone]  [facility] [address] [place] [address] Phone: [phone] Fax: [phone]     Social Determinants of Health (SDOH) Interventions    Readmission Risk Interventions     No data to display

## 2021-12-27 LAB — CULTURE, BLOOD (ROUTINE X 2): Culture: NO GROWTH

## 2022-01-04 NOTE — Therapy (Signed)
OUTPATIENT PHYSICAL THERAPY LOWER EXTREMITY EVALUATION   Patient Name: Justin [name] MRN: [id number] DOB:Jun 18, 1965, 56 y.o., male Today's Date: 01/05/2022   PT End of Session - 01/05/22 1100     Visit Number 1    Number of Visits 13    Date for PT Re-Evaluation 02/17/22    Authorization Type MCD    PT Start Time 1100    PT Stop Time 1138    PT Time Calculation (min) 38 min    Activity Tolerance Patient tolerated treatment well    Behavior During Therapy [facility] for tasks assessed/performed             Past Medical History:  Diagnosis Date   Achilles rupture, left    Bipolar 1 disorder (HCC)    Dental caries    periodontitis   Pneumonia    Pulmonary embolism (HCC) 05/18/2007   Sleep apnea    wears CPAP   Subdural hematoma (HCC) 01/04/2013   in setting of supratherapeutic INR   Past Surgical History:  Procedure Laterality Date   ACHILLES TENDON SURGERY Left 10/21/2014   Procedure: Left Achilles Reconstruction;  Surgeon: Nadara Mustard, MD;  Location: [facility] OR;  Service: Orthopedics;  Laterality: Left;   APPENDECTOMY     CARDIAC CATHETERIZATION  03/04/2018   [facility]: Normal coronaries, LVEF estimated at 40%, medical Rx   CRANIOTOMY N/A 01/19/2013   Procedure: CRANIOTOMY HEMATOMA EVACUATION SUBDURAL;  Surgeon: Hewitt Shorts, MD;  Location: [facility] NEURO [facility]S;  Service: Neurosurgery;  Laterality: N/A;   CYSTECTOMY     right head   ELBOW SURGERY     right   FRACTURE SURGERY     finger   I & D EXTREMITY Left 12/07/2016   Procedure: LEFT ACHILLES DEBRIDEMENT;  Surgeon: Nadara Mustard, MD;  Location: [facility] OR;  Service: Orthopedics;  Laterality: Left;   LUMBAR FUSION  12/23/2017   L5 GILL PROCEDURE, RIGHT L5-S1, TRANSFORAMIAL LUMBAR INTERBODY FUSION, BILATERAL LATERAL FUSION, PEDICLE INSTRUMENTATION   MULTIPLE EXTRACTIONS WITH ALVEOLOPLASTY N/A 03/07/2015   Procedure: MULTIPLE EXTRACTION WITH ALVEOLOPLASTY;  Surgeon: [name], DDS;  Location: [facility] OR;  Service: Oral  Surgery;  Laterality: N/A;   SPINAL CORD STIMULATOR INSERTION N/A 05/18/2019   Procedure: LUMBAR SPINAL CORD STIMULATOR INSERTION;  Surgeon: Odette Fraction, MD;  Location: [facility] OR;  Service: Neurosurgery;  Laterality: N/A;  Thoracic/Lumbar   SPINAL CORD STIMULATOR INSERTION N/A 04/06/2020   Procedure: Revision of spinal cord stimulator;  Surgeon: Renaldo Fiddler, MD;  Location: [facility] OR;  Service: Neurosurgery;  Laterality: N/A;   Patient Active Problem List   Diagnosis Date Noted   Hematoma of left thigh    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   Leukocytosis 12/22/2021   SIRS (systemic inflammatory response syndrome) (HCC) 12/22/2021   Normocytic anemia 12/22/2021   Hematoma 12/09/2021   Acute deep vein thrombosis (DVT) of right peroneal vein (HCC) 10/19/2021   History of pulmonary embolism 10/19/2021   Status post lumbar spinal fusion 01/03/2018   Lumbar stenosis 12/23/2017   Spondylolisthesis, lumbar region 06/25/2017   Achilles tendinitis of left lower extremity 12/07/2016   Achilles tendinitis, left leg 05/17/2016   Obstructive sleep apnea on CPAP 03/07/2015   Post-operative state 03/07/2015   Achilles rupture, left 10/21/2014   OSA (obstructive sleep apnea) 04/23/2013   Bipolar disorder, unspecified (HCC) 01/07/2013   Sinus tachycardia 01/07/2013   Headache(784.0) 01/07/2013   SDH (subdural hematoma) (HCC) 01/04/2013   H/O PE 01/04/2013   Chronic anticoagulation 01/04/2013   Warfarin-induced coagulopathy (  HCC) 01/04/2013   Acute sinusitis 01/04/2013    PCP: [facility]  REFERRING PROVIDER: Dimple Nanas, MD  REFERRING DIAG: [phone] (ICD-10-CM) - Hematoma of left thigh, initial encounter  THERAPY DIAG:  Other abnormalities of gait and mobility  Muscle weakness (generalized)  Pain of left lower extremity  Rationale for Evaluation and Treatment Rehabilitation  ONSET DATE: September 2023   SUBJECTIVE:   SUBJECTIVE STATEMENT: Patient was recently admitted to the hospital  from 9/22-9/24 after being found to have a spontaneous large left thigh intramuscular hematoma in the setting of supratherapeutic INR greater than 7. Since being discharged patient noted that he had continued swelling in the left leg going down into his calf with dark discoloration present.He was readmitted to the hospital from 10/5-10/9/23  due to concern for pyomyositis with cultures remaining negative. Since hospital discharge he reports that the swelling has improved, but still has swelling and discoloration in the calf and burning in the quad. He continues to use the RW for community ambulation. He also feels like his balance is still off feeling unstable on the LLE. He has f/u with PCP on 01/17/22. Prior to hospitalization patient was ambulating without an AD.   PERTINENT HISTORY: Bipolar disorder Pulmonary embolism  Subdural hematoma   Lumbar fusion   Spinal cord stimulator   PAIN:  Are you having pain? None currentlyYes: NPRS scale: 8 at worst/10 Pain location: Lt quad and calf Pain description: burning;cramp Aggravating factors: standing, walking Relieving factors: sitting, rest  PRECAUTIONS: Fall; history of DVT/PE on anti-coags   WEIGHT BEARING RESTRICTIONS No  FALLS:  Has patient fallen in last 6 months? No  LIVING ENVIRONMENT: Lives with: lives with their family Lives in: House/apartment Stairs: No Has following equipment at home: Environmental consultant - 2 wheeled  OCCUPATION: disability   PLOF: Independent  PATIENT GOALS:  "feel better"   OBJECTIVE:   DIAGNOSTIC FINDINGS:  LLE CT scan: IMPRESSION: 1. No arterial stenosis.  No findings of deep venous thrombosis. 2. Trace calcific plaques in the left internal iliac artery and scattered along the left anterior and posterior tibial arteries. 3. As compared to the study of 12/09/2021, large collection in the vastus lateralis muscle appears more organized and is more complex, with numerous enhancing septations and multiple tiny  fluid pockets. The appearance is most suggestive of a pyomyositis although component of hematoma could still be present. No soft tissue gas is seen but there is edema in the anterior compartment deep soft tissue planes which can be seen with fasciitis. 4. Generalized left lower extremity edema. No subcutaneous abscess or soft tissue gas. 5. 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 No overlying cortical subsidence or significant findings of secondary hip DJD. 6. 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 7. Stable 1.7 cm soft tissue nodule in the left ischioanal fossa subcutaneous fat. Nonspecific. Follow-up as indicated. 8. Mildly enlarged lymph nodes in the left external iliac and inguinal chains probably reactive. 9. Mild prostatomegaly.    PATIENT SURVEYS:  LEFS 27/80  COGNITION:  Overall cognitive status: Within functional limits for tasks assessed     SENSATION: WFL  EDEMA:  Diffuse swelling about LLE; discoloration about distal LLE  MUSCLE LENGTH: Hamstrings: lacking 55 Lt; lacking 20 Rt   POSTURE: No Significant postural limitations  PALPATION: Diffuse tenderness about left lower leg and quad   LOWER EXTREMITY ROM:  Active ROM Right eval Left eval  Hip flexion    Hip extension    Hip abduction  Hip adduction    Hip internal rotation    Hip external rotation    Knee flexion 128 120  Knee extension WNL WNL  Ankle dorsiflexion 8 0  Ankle plantarflexion    Ankle inversion    Ankle eversion     (Blank rows = not tested)  LOWER EXTREMITY MMT:  MMT Right eval Left eval  Hip flexion 5 5  Hip extension    Hip abduction    Hip adduction    Hip internal rotation    Hip external rotation    Knee flexion 5 5  Knee extension 5 4-  Ankle dorsiflexion 5 3-  Ankle plantarflexion    Ankle inversion    Ankle eversion     (Blank rows = not tested)  LOWER EXTREMITY SPECIAL TESTS:  (+) [place]   FUNCTIONAL TESTS:  TUG 14.5 seconds 5 x STS: 20 seconds   SLS: 6 seconds LLE; 9 seconds RLE   GAIT: Distance walked: 25 ft Assistive device utilized: None Level of assistance: Complete Independence Comments: occasional buckling of the Lt knee; foot flat initial contact.     TODAY'S TREATMENT: [facility] Adult PT Treatment:                                                DATE: 01/05/22 Therapeutic Exercise: Demonstrated and issue initial HEP.   Therapeutic Activity: Education on assessment findings that will be addressed throughout duration of POC.      PATIENT EDUCATION:  Education details: see treatment Person educated: Patient Education method: Explanation, Demonstration, Tactile cues, Verbal cues, and Handouts Education comprehension: verbalized understanding, returned demonstration, verbal cues required, tactile cues required, and needs further education   HOME EXERCISE PROGRAM: Access Code: [email or web address] URL: [email or web address] Date: 01/05/2022 Prepared by: [name]  Exercises - Seated Long Arc Quad  - 2 x daily - 7 x weekly - 2 sets - 10 reps - Seated Toe Raise  - 2 x daily - 7 x weekly - 2 sets - 10 reps - Long Sitting Calf Stretch with Strap  - 2 x daily - 7 x weekly - 3 sets - 30 sec  hold - Seated Hamstring Stretch  - 2 x daily - 7 x weekly - 3 sets - 30 sec  hold  ASSESSMENT:  CLINICAL IMPRESSION: Patient is a 56 y.o. male who was seen today for physical therapy evaluation and treatment for Lt thigh pain secondary to a left thigh intramuscular hematoma in the setting of supratherapeutic INR greater than 7. Since his recent hospilizations for concerns of pyomyositis he reports slight improvement in the LLE pain and swelling and has been able to ambulate household distances without an AD, but utilzes RW for community ambulation. Upon assessment he is noted to have tightness about the quad, hamstring, and calf, quadriceps and dorsiflexor weakness, gait instability, balance impairments, and activity limitations  secondary to pain. He will benefit from skilled PT to address the above stated deficits in order to optimize his function.    OBJECTIVE IMPAIRMENTS Abnormal gait, decreased activity tolerance, decreased balance, decreased knowledge of condition, difficulty walking, decreased ROM, decreased strength, impaired flexibility, improper body mechanics, and pain.   ACTIVITY LIMITATIONS carrying, lifting, bending, standing, squatting, stairs, transfers, and locomotion level  PARTICIPATION LIMITATIONS: meal prep, cleaning, laundry, shopping, community activity, and yard work  PERSONAL FACTORS Age, Time since onset  of injury/illness/exacerbation, and 3+ comorbidities: see PMH above   are also affecting patient's functional outcome.   REHAB POTENTIAL: Good  CLINICAL DECISION MAKING: Evolving/moderate complexity  EVALUATION COMPLEXITY: Moderate   GOALS: Goals reviewed with patient? Yes  SHORT TERM GOALS: Target date: 01/26/22 Patient will be independent and compliant with initial HEP.   Baseline: issued at eval  Goal status: INITIAL  2.  Patient will demonstrate at least 5 degrees of Lt ankle DF AROM to improve gait mechanics.  Baseline: 0 Goal status: INITIAL  3.  Patient will improve Lt hamstring flexibility by at least 15 degrees to reduce stress on the LLE with walking.  Baseline: lacking 55 Goal status: INITIAL   LONG TERM GOALS: Target date: 02/17/22  Patient will demonstrate 5/5 Lt knee extensor strength to reduce buckling with walking.  Baseline: see above Goal status: INITIAL  2.  Patient will demonstrate 5/5 Lt ankle DF strength to improve ability to heel strike.  Baseline: see above  Goal status: INITIAL  3.  Patient will score at least 40/80 on the LEFS to signify clinically meaningful improvement in functional abilities.   Baseline: 27/80 Goal status: INITIAL  4.  Patient will complete 5 x STS </= 13 seconds to improve his functional strength and ease of transfers.   Baseline: see above  Goal status: INITIAL  5.  Patient will ambulate community distances without need for AD.  Baseline:  Goal status: INITIAL   PLAN: PT FREQUENCY: 2x/week  PT DURATION: 6 weeks  PLANNED INTERVENTIONS: Therapeutic exercises, Therapeutic activity, Neuromuscular re-education, Balance training, Gait training, Patient/Family education, Self Care, Manual therapy, and Re-evaluation  PLAN FOR NEXT SESSION: LLE stretching and strengthening; review and update HEP prn; balance training   Gwendolyn Grant, PT, DPT, ATC 01/05/22 12:46 PM

## 2022-01-05 ENCOUNTER — Other Ambulatory Visit: Payer: Self-pay

## 2022-01-05 ENCOUNTER — Ambulatory Visit: Payer: Medicaid Other | Attending: Internal Medicine

## 2022-01-05 DIAGNOSIS — M6281 Muscle weakness (generalized): Secondary | ICD-10-CM | POA: Diagnosis present

## 2022-01-05 DIAGNOSIS — R2689 Other abnormalities of gait and mobility: Secondary | ICD-10-CM | POA: Insufficient documentation

## 2022-01-05 DIAGNOSIS — S7012XA Contusion of left thigh, initial encounter: Secondary | ICD-10-CM | POA: Insufficient documentation

## 2022-01-05 DIAGNOSIS — M79605 Pain in left leg: Secondary | ICD-10-CM | POA: Diagnosis present

## 2022-01-11 ENCOUNTER — Other Ambulatory Visit (HOSPITAL_COMMUNITY): Payer: Self-pay | Admitting: Neurosurgery

## 2022-01-11 ENCOUNTER — Other Ambulatory Visit: Payer: Self-pay | Admitting: Neurosurgery

## 2022-01-11 DIAGNOSIS — M5416 Radiculopathy, lumbar region: Secondary | ICD-10-CM

## 2022-01-15 ENCOUNTER — Ambulatory Visit: Payer: Medicaid Other

## 2022-01-15 DIAGNOSIS — R2689 Other abnormalities of gait and mobility: Secondary | ICD-10-CM | POA: Diagnosis not present

## 2022-01-15 DIAGNOSIS — M6281 Muscle weakness (generalized): Secondary | ICD-10-CM

## 2022-01-15 DIAGNOSIS — M79605 Pain in left leg: Secondary | ICD-10-CM

## 2022-01-15 NOTE — Therapy (Signed)
OUTPATIENT PHYSICAL THERAPY TREATMENT NOTE   Patient Name: Justin [name] MRN: [id number] DOB:12-Feb-1966, 56 y.o., male Today's Date: 01/15/2022  PCP: [facility] REFERRING PROVIDER: Damita Lack, MD  END OF SESSION:   PT End of Session - 01/15/22 1412     Visit Number 2    Number of Visits 13    Date for PT Re-Evaluation 02/17/22    Authorization Type MCD    Authorization Time Period 10/24-12/4/23    Authorization - Visit Number 1    Authorization - Number of Visits 12    PT Start Time 9233    PT Stop Time 1458    PT Time Calculation (min) 43 min    Activity Tolerance Patient tolerated treatment well    Behavior During Therapy [facility] for tasks assessed/performed             Past Medical History:  Diagnosis Date   Achilles rupture, left    Bipolar 1 disorder ([place])    Dental caries    periodontitis   Pneumonia    Pulmonary embolism ([place]) 05/18/2007   Sleep apnea    wears CPAP   Subdural hematoma ([place]) 01/04/2013   in setting of supratherapeutic INR   Past Surgical History:  Procedure Laterality Date   ACHILLES TENDON SURGERY Left 10/21/2014   Procedure: Left Achilles Reconstruction;  Surgeon: Newt Minion, MD;  Location: [place];  Service: Orthopedics;  Laterality: Left;   APPENDECTOMY     CARDIAC CATHETERIZATION  03/04/2018   [facility]: Normal coronaries, LVEF estimated at 40%, medical Rx   CRANIOTOMY N/A 01/19/2013   Procedure: CRANIOTOMY HEMATOMA EVACUATION SUBDURAL;  Surgeon: Hosie Spangle, MD;  Location: [place] NEURO ORS;  Service: Neurosurgery;  Laterality: N/A;   CYSTECTOMY     right head   ELBOW SURGERY     right   FRACTURE SURGERY     finger   I & D EXTREMITY Left 12/07/2016   Procedure: LEFT ACHILLES DEBRIDEMENT;  Surgeon: Newt Minion, MD;  Location: [place];  Service: Orthopedics;  Laterality: Left;   LUMBAR FUSION  12/23/2017   L5 GILL PROCEDURE, RIGHT L5-S1, TRANSFORAMIAL LUMBAR INTERBODY FUSION, BILATERAL LATERAL FUSION, PEDICLE  INSTRUMENTATION   MULTIPLE EXTRACTIONS WITH ALVEOLOPLASTY N/A 03/07/2015   Procedure: MULTIPLE EXTRACTION WITH ALVEOLOPLASTY;  Surgeon: Diona Browner, DDS;  Location: [place];  Service: Oral Surgery;  Laterality: N/A;   SPINAL CORD STIMULATOR INSERTION N/A 05/18/2019   Procedure: LUMBAR SPINAL CORD STIMULATOR INSERTION;  Surgeon: Clydell Hakim, MD;  Location: [place];  Service: Neurosurgery;  Laterality: N/A;  Thoracic/Lumbar   SPINAL CORD STIMULATOR INSERTION N/A 04/06/2020   Procedure: Revision of spinal cord stimulator;  Surgeon: Reece Agar, MD;  Location: [place];  Service: Neurosurgery;  Laterality: N/A;   Patient Active Problem List   Diagnosis Date Noted   Hematoma of left thigh    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   Leukocytosis 12/22/2021   SIRS (systemic inflammatory response syndrome) ([place]) 12/22/2021   Normocytic anemia 12/22/2021   Hematoma 12/09/2021   Acute deep vein thrombosis (DVT) of right peroneal vein ([place]) 10/19/2021   History of pulmonary embolism 10/19/2021   Status post lumbar spinal fusion 01/03/2018   Lumbar stenosis 12/23/2017   Spondylolisthesis, lumbar region 06/25/2017   Achilles tendinitis of left lower extremity 12/07/2016   Achilles tendinitis, left leg 05/17/2016   Obstructive sleep apnea on CPAP 03/07/2015   Post-operative state 03/07/2015   Achilles rupture, left 10/21/2014   OSA (obstructive sleep apnea)  04/23/2013   Bipolar disorder, unspecified ([place]) 01/07/2013   Sinus tachycardia 01/07/2013   Headache(784.0) 01/07/2013   SDH (subdural hematoma) ([place]) 01/04/2013   H/O PE 01/04/2013   Chronic anticoagulation 01/04/2013   Warfarin-induced coagulopathy ([place]) 01/04/2013   Acute sinusitis 01/04/2013    REFERRING DIAG: B35.32DJ (ICD-10-CM) - Hematoma of left thigh, initial encounter  THERAPY DIAG:  Other abnormalities of gait and mobility  Muscle weakness (generalized)  Pain of left lower extremity  Rationale for Evaluation and Treatment  Rehabilitation  PERTINENT HISTORY:  Bipolar disorder Pulmonary embolism  Subdural hematoma Lumbar fusion  Spinal cord stimulator   PRECAUTIONS: Fall; history of DVT/PE on anti-coags  SUBJECTIVE:                                                                                                                                                                                      SUBJECTIVE STATEMENT:  Patient reports the leg is still feeling tender. He reports compliance with HEP.    PAIN:  Are you having pain? Yes: NPRS scale: 7/10 Pain location: Lt calf and lateral thigh Pain description: tight;burn Aggravating factors: walking Relieving factors: laying down   OBJECTIVE: (objective measures completed at initial evaluation unless otherwise dated)  DIAGNOSTIC FINDINGS:  LLE CT scan: IMPRESSION: 1. No arterial stenosis.  No findings of deep venous thrombosis. 2. Trace calcific plaques in the left internal iliac artery and scattered along the left anterior and posterior tibial arteries. 3. As compared to the study of 12/09/2021, large collection in the vastus lateralis muscle appears more organized and is more complex, with numerous enhancing septations and multiple tiny fluid pockets. The appearance is most suggestive of a pyomyositis although component of hematoma could still be present. No soft tissue gas is seen but there is edema in the anterior compartment deep soft tissue planes which can be seen with fasciitis. 4. Generalized left lower extremity edema. No subcutaneous abscess or soft tissue gas. 5. 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 No overlying cortical subsidence or significant findings of secondary hip DJD. 6. 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 7. Stable 1.7 cm soft tissue nodule in the left ischioanal fossa subcutaneous fat. Nonspecific. Follow-up as indicated. 8. Mildly enlarged lymph nodes in the left external iliac and inguinal chains probably  reactive. 9. Mild prostatomegaly.     PATIENT SURVEYS:  LEFS 27/80   COGNITION:           Overall cognitive status: Within functional limits for tasks assessed                          SENSATION: [facility]  EDEMA:  Diffuse swelling about LLE; discoloration about distal LLE   MUSCLE LENGTH: Hamstrings: lacking 55 Lt; lacking 20 Rt  01/15/22: Lt lacking 35 degrees    POSTURE: No Significant postural limitations   PALPATION: Diffuse tenderness about left lower leg and quad    LOWER EXTREMITY ROM:   Active ROM Right eval Left eval  Hip flexion      Hip extension      Hip abduction      Hip adduction      Hip internal rotation      Hip external rotation      Knee flexion 128 120  Knee extension WNL WNL  Ankle dorsiflexion 8 0  Ankle plantarflexion      Ankle inversion      Ankle eversion       (Blank rows = not tested)   LOWER EXTREMITY MMT:   MMT Right eval Left eval  Hip flexion 5 5  Hip extension      Hip abduction      Hip adduction      Hip internal rotation      Hip external rotation      Knee flexion 5 5  Knee extension 5 4-  Ankle dorsiflexion 5 3-  Ankle plantarflexion      Ankle inversion      Ankle eversion       (Blank rows = not tested)   LOWER EXTREMITY SPECIAL TESTS:  (+) [place]    FUNCTIONAL TESTS:  TUG 14.5 seconds 5 x STS: 20 seconds  SLS: 6 seconds LLE; 9 seconds RLE    GAIT: Distance walked: 25 ft Assistive device utilized: None Level of assistance: Complete Independence Comments: occasional buckling of the Lt knee; foot flat initial contact.        TODAY'S TREATMENT: [facility] Adult PT Treatment:                                                DATE: 01/15/22 Therapeutic Exercise: HS stretch with strap 2 x 30 sec  IT band stretch 2 x 30 sec  Calf stretch with strap 2 x 30 sec  Quad stretch with strap 2 x 30 sec  Heel slides on stability ball x 10  Rockerboard A/P 2 x 10  4 way ankle [place] band 1 x 10 Ankle circles CW/CCW x 10  each  Resisted HS curl 2 x 10 [place] band Updated HEP     [facility] Adult PT Treatment:                                                DATE: 01/05/22 Therapeutic Exercise: Demonstrated and issue initial HEP.    Therapeutic Activity: Education on assessment findings that will be addressed throughout duration of POC.          PATIENT EDUCATION:  Education details: see treatment Person educated: Patient Education method: Explanation, Demonstration, Tactile cues, Verbal cues, and Handouts Education comprehension: verbalized understanding, returned demonstration, verbal cues required, tactile cues required, and needs further education     HOME EXERCISE PROGRAM: Access Code: [email or web address] URL: [email or web address] Date: 01/05/2022 Prepared by: Gwendolyn Grant   Exercises - Seated Long Arc Quad  - 2 x daily -  7 x weekly - 2 sets - 10 reps - Seated Toe Raise  - 2 x daily - 7 x weekly - 2 sets - 10 reps - Long Sitting Calf Stretch with Strap  - 2 x daily - 7 x weekly - 3 sets - 30 sec  hold - Seated Hamstring Stretch  - 2 x daily - 7 x weekly - 3 sets - 30 sec  hold   ASSESSMENT:   CLINICAL IMPRESSION: Patient arrives with moderate LLE pain. Focused on stretching and strengthening of the LLE, which he tolerated well. With 4 way ankle he has difficulty isolating inversion and eversion motion. HEP was updated to include further ankle strengthening/mobility. His hamstring flexibility has significantly improved compared to initial evaluation having met this short term functional goal.      OBJECTIVE IMPAIRMENTS Abnormal gait, decreased activity tolerance, decreased balance, decreased knowledge of condition, difficulty walking, decreased ROM, decreased strength, impaired flexibility, improper body mechanics, and pain.    ACTIVITY LIMITATIONS carrying, lifting, bending, standing, squatting, stairs, transfers, and locomotion level   PARTICIPATION LIMITATIONS: meal prep, cleaning,  laundry, shopping, community activity, and yard work   PERSONAL FACTORS Age, Time since onset of injury/illness/exacerbation, and 3+ comorbidities: see PMH above   are also affecting patient's functional outcome.    REHAB POTENTIAL: Good   CLINICAL DECISION MAKING: Evolving/moderate complexity   EVALUATION COMPLEXITY: Moderate     GOALS: Goals reviewed with patient? Yes   SHORT TERM GOALS: Target date: 01/26/22 Patient will be independent and compliant with initial HEP.    Baseline: issued at eval  Goal status: INITIAL   2.  Patient will demonstrate at least 5 degrees of Lt ankle DF AROM to improve gait mechanics.  Baseline: 0 Goal status: INITIAL   3.  Patient will improve Lt hamstring flexibility by at least 15 degrees to reduce stress on the LLE with walking.  Baseline: lacking 55 Goal status: met      LONG TERM GOALS: Target date: 02/17/22   Patient will demonstrate 5/5 Lt knee extensor strength to reduce buckling with walking.  Baseline: see above Goal status: INITIAL   2.  Patient will demonstrate 5/5 Lt ankle DF strength to improve ability to heel strike.  Baseline: see above  Goal status: INITIAL   3.  Patient will score at least 40/80 on the LEFS to signify clinically meaningful improvement in functional abilities.    Baseline: 27/80 Goal status: INITIAL   4.  Patient will complete 5 x STS </= 13 seconds to improve his functional strength and ease of transfers.  Baseline: see above  Goal status: INITIAL   5.  Patient will ambulate community distances without need for AD.  Baseline:  Goal status: INITIAL     PLAN: PT FREQUENCY: 2x/week   PT DURATION: 6 weeks   PLANNED INTERVENTIONS: Therapeutic exercises, Therapeutic activity, Neuromuscular re-education, Balance training, Gait training, Patient/Family education, Self Care, Manual therapy, and Re-evaluation   PLAN FOR NEXT SESSION: LLE stretching and strengthening; review and update HEP prn; balance  training    Gwendolyn Grant, PT, DPT, ATC 01/15/22 3:00 PM

## 2022-01-17 ENCOUNTER — Ambulatory Visit: Payer: Medicaid Other | Attending: Internal Medicine

## 2022-01-17 DIAGNOSIS — M79605 Pain in left leg: Secondary | ICD-10-CM | POA: Diagnosis present

## 2022-01-17 DIAGNOSIS — M6281 Muscle weakness (generalized): Secondary | ICD-10-CM | POA: Diagnosis present

## 2022-01-17 DIAGNOSIS — R2689 Other abnormalities of gait and mobility: Secondary | ICD-10-CM | POA: Diagnosis not present

## 2022-01-17 NOTE — Therapy (Signed)
OUTPATIENT PHYSICAL THERAPY TREATMENT NOTE   Patient Name: Justin [name] MRN: [id number] DOB:March 28, 1965, 56 y.o., male Today's Date: 01/17/2022  PCP: [facility] REFERRING PROVIDER: Damita Lack, MD  END OF SESSION:   PT End of Session - 01/17/22 1531     Visit Number 3    Number of Visits 13    Date for PT Re-Evaluation 02/17/22    Authorization Type MCD    Authorization Time Period 10/24-12/4/23    Authorization - Visit Number 2    Authorization - Number of Visits 12    PT Start Time 5631    PT Stop Time 1612    PT Time Calculation (min) 41 min    Activity Tolerance Patient tolerated treatment well    Behavior During Therapy [facility] for tasks assessed/performed              Past Medical History:  Diagnosis Date   Achilles rupture, left    Bipolar 1 disorder ([place])    Dental caries    periodontitis   Pneumonia    Pulmonary embolism ([place]) 05/18/2007   Sleep apnea    wears CPAP   Subdural hematoma ([place]) 01/04/2013   in setting of supratherapeutic INR   Past Surgical History:  Procedure Laterality Date   ACHILLES TENDON SURGERY Left 10/21/2014   Procedure: Left Achilles Reconstruction;  Surgeon: Newt Minion, MD;  Location: [place];  Service: Orthopedics;  Laterality: Left;   APPENDECTOMY     CARDIAC CATHETERIZATION  03/04/2018   [facility]: Normal coronaries, LVEF estimated at 40%, medical Rx   CRANIOTOMY N/A 01/19/2013   Procedure: CRANIOTOMY HEMATOMA EVACUATION SUBDURAL;  Surgeon: Hosie Spangle, MD;  Location: [place] [facility];  Service: Neurosurgery;  Laterality: N/A;   CYSTECTOMY     right head   ELBOW SURGERY     right   FRACTURE SURGERY     finger   I & D EXTREMITY Left 12/07/2016   Procedure: LEFT ACHILLES DEBRIDEMENT;  Surgeon: Newt Minion, MD;  Location: [place];  Service: Orthopedics;  Laterality: Left;   LUMBAR FUSION  12/23/2017   L5 GILL PROCEDURE, RIGHT L5-S1, TRANSFORAMIAL LUMBAR INTERBODY FUSION, BILATERAL LATERAL FUSION, PEDICLE  INSTRUMENTATION   MULTIPLE EXTRACTIONS WITH ALVEOLOPLASTY N/A 03/07/2015   Procedure: MULTIPLE EXTRACTION WITH ALVEOLOPLASTY;  Surgeon: Diona Browner, DDS;  Location: [place];  Service: Oral Surgery;  Laterality: N/A;   SPINAL CORD STIMULATOR INSERTION N/A 05/18/2019   Procedure: LUMBAR SPINAL CORD STIMULATOR INSERTION;  Surgeon: Clydell Hakim, MD;  Location: [place];  Service: Neurosurgery;  Laterality: N/A;  Thoracic/Lumbar   SPINAL CORD STIMULATOR INSERTION N/A 04/06/2020   Procedure: Revision of spinal cord stimulator;  Surgeon: Reece Agar, MD;  Location: [place];  Service: Neurosurgery;  Laterality: N/A;   Patient Active Problem List   Diagnosis Date Noted   Hematoma of left thigh    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   Leukocytosis 12/22/2021   SIRS (systemic inflammatory response syndrome) ([place]) 12/22/2021   Normocytic anemia 12/22/2021   Hematoma 12/09/2021   Acute deep vein thrombosis (DVT) of right peroneal vein ([place]) 10/19/2021   History of pulmonary embolism 10/19/2021   Status post lumbar spinal fusion 01/03/2018   Lumbar stenosis 12/23/2017   Spondylolisthesis, lumbar region 06/25/2017   Achilles tendinitis of left lower extremity 12/07/2016   Achilles tendinitis, left leg 05/17/2016   Obstructive sleep apnea on CPAP 03/07/2015   Post-operative state 03/07/2015   Achilles rupture, left 10/21/2014   OSA (obstructive sleep  apnea) 04/23/2013   Bipolar disorder, unspecified ([place]) 01/07/2013   Sinus tachycardia 01/07/2013   Headache(784.0) 01/07/2013   SDH (subdural hematoma) ([place]) 01/04/2013   H/O PE 01/04/2013   Chronic anticoagulation 01/04/2013   Warfarin-induced coagulopathy ([place]) 01/04/2013   Acute sinusitis 01/04/2013    REFERRING DIAG: Z61.09UE (ICD-10-CM) - Hematoma of left thigh, initial encounter  THERAPY DIAG:  Other abnormalities of gait and mobility  Muscle weakness (generalized)  Pain of left lower extremity  Rationale for Evaluation and Treatment  Rehabilitation  PERTINENT HISTORY:  Bipolar disorder Pulmonary embolism  Subdural hematoma Lumbar fusion  Spinal cord stimulator   PRECAUTIONS: Fall; history of DVT/PE on anti-coags  SUBJECTIVE:                                                                                                                                                                                      SUBJECTIVE STATEMENT:  Patient reports the leg is feeling the same. He reports compliance with HEP.    PAIN:  Are you having pain? Yes: NPRS scale: 7/10 Pain location: Lt calf and lateral thigh Pain description: tight;burn Aggravating factors: walking Relieving factors: laying down   OBJECTIVE: (objective measures completed at initial evaluation unless otherwise dated)  DIAGNOSTIC FINDINGS:  LLE CT scan: IMPRESSION: 1. No arterial stenosis.  No findings of deep venous thrombosis. 2. Trace calcific plaques in the left internal iliac artery and scattered along the left anterior and posterior tibial arteries. 3. As compared to the study of 12/09/2021, large collection in the vastus lateralis muscle appears more organized and is more complex, with numerous enhancing septations and multiple tiny fluid pockets. The appearance is most suggestive of a pyomyositis although component of hematoma could still be present. No soft tissue gas is seen but there is edema in the anterior compartment deep soft tissue planes which can be seen with fasciitis. 4. Generalized left lower extremity edema. No subcutaneous abscess or soft tissue gas. 5. 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 No overlying cortical subsidence or significant findings of secondary hip DJD. 6. 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 7. Stable 1.7 cm soft tissue nodule in the left ischioanal fossa subcutaneous fat. Nonspecific. Follow-up as indicated. 8. Mildly enlarged lymph nodes in the left external iliac and inguinal chains probably  reactive. 9. Mild prostatomegaly.     PATIENT SURVEYS:  LEFS 27/80   COGNITION:           Overall cognitive status: Within functional limits for tasks assessed                          SENSATION: [facility]  EDEMA:  Diffuse swelling about LLE; discoloration about distal LLE   MUSCLE LENGTH: Hamstrings: lacking 55 Lt; lacking 20 Rt  01/15/22: Lt lacking 35 degrees    POSTURE: No Significant postural limitations   PALPATION: Diffuse tenderness about left lower leg and quad    LOWER EXTREMITY ROM:   Active ROM Right eval Left eval  Hip flexion      Hip extension      Hip abduction      Hip adduction      Hip internal rotation      Hip external rotation      Knee flexion 128 120  Knee extension WNL WNL  Ankle dorsiflexion 8 0  Ankle plantarflexion      Ankle inversion      Ankle eversion       (Blank rows = not tested)   LOWER EXTREMITY MMT:   MMT Right eval Left eval  Hip flexion 5 5  Hip extension      Hip abduction      Hip adduction      Hip internal rotation      Hip external rotation      Knee flexion 5 5  Knee extension 5 4-  Ankle dorsiflexion 5 3-  Ankle plantarflexion      Ankle inversion      Ankle eversion       (Blank rows = not tested)   LOWER EXTREMITY SPECIAL TESTS:  (+) [place]    FUNCTIONAL TESTS:  TUG 14.5 seconds 5 x STS: 20 seconds  SLS: 6 seconds LLE; 9 seconds RLE    GAIT: Distance walked: 25 ft Assistive device utilized: None Level of assistance: Complete Independence Comments: occasional buckling of the Lt knee; foot flat initial contact.        TODAY'S TREATMENT: [facility] Adult PT Treatment:                                                DATE: 01/17/22 Therapeutic Exercise: HS stretch with strap x 60 seconds  IT band stretch with strap x 60 seconds  Quad stretch with strap x 60 seconds  Calf stretch on slantboard x 60 seconds  Standing calf raise 2 x 10  SLR 2 x 10  Hip bridge 2 x 10  Sidelying hip abduction 2 x 10  LAQ  2 x 10; 2# Updated HEP    [facility] Adult PT Treatment:                                                DATE: 01/15/22 Therapeutic Exercise: HS stretch with strap 2 x 30 sec  IT band stretch 2 x 30 sec  Calf stretch with strap 2 x 30 sec  Quad stretch with strap 2 x 30 sec  Heel slides on stability ball x 10  Rockerboard A/P 2 x 10  4 way ankle Fells band 1 x 10 Ankle circles CW/CCW x 10 each  Resisted HS curl 2 x 10 [place] band Updated HEP     [facility] Adult PT Treatment:  DATE: 01/05/22 Therapeutic Exercise: Demonstrated and issue initial HEP.    Therapeutic Activity: Education on assessment findings that will be addressed throughout duration of POC.          PATIENT EDUCATION:  Education details: see treatment Person educated: Patient Education method: Explanation, Demonstration, Tactile cues, Verbal cues, and Handouts Education comprehension: verbalized understanding, returned demonstration, verbal cues required, tactile cues required, and needs further education     HOME EXERCISE PROGRAM: Access Code: [email or web address] URL: [email or web address] Date: 01/05/2022 Prepared by: Gwendolyn Grant   Exercises - Seated Long Arc Quad  - 2 x daily - 7 x weekly - 2 sets - 10 reps - Seated Toe Raise  - 2 x daily - 7 x weekly - 2 sets - 10 reps - Long Sitting Calf Stretch with Strap  - 2 x daily - 7 x weekly - 3 sets - 30 sec  hold - Seated Hamstring Stretch  - 2 x daily - 7 x weekly - 3 sets - 30 sec  hold   ASSESSMENT:   CLINICAL IMPRESSION: Patient arrives with ongoing LLE pain. Continued emphasis on stretching and strengthening of the LLE with good tolerance. Visible shaking with targeted hip flexor and abductor strengthening. He reported occasional pulling in the calf with strengthening otherwise no other complaints with there ex. HEP was updated to include further strengthening.      OBJECTIVE IMPAIRMENTS Abnormal gait,  decreased activity tolerance, decreased balance, decreased knowledge of condition, difficulty walking, decreased ROM, decreased strength, impaired flexibility, improper body mechanics, and pain.    ACTIVITY LIMITATIONS carrying, lifting, bending, standing, squatting, stairs, transfers, and locomotion level   PARTICIPATION LIMITATIONS: meal prep, cleaning, laundry, shopping, community activity, and yard work   PERSONAL FACTORS Age, Time since onset of injury/illness/exacerbation, and 3+ comorbidities: see PMH above   are also affecting patient's functional outcome.    REHAB POTENTIAL: Good   CLINICAL DECISION MAKING: Evolving/moderate complexity   EVALUATION COMPLEXITY: Moderate     GOALS: Goals reviewed with patient? Yes   SHORT TERM GOALS: Target date: 01/26/22 Patient will be independent and compliant with initial HEP.    Baseline: issued at eval  Goal status: INITIAL   2.  Patient will demonstrate at least 5 degrees of Lt ankle DF AROM to improve gait mechanics.  Baseline: 0 Goal status: INITIAL   3.  Patient will improve Lt hamstring flexibility by at least 15 degrees to reduce stress on the LLE with walking.  Baseline: lacking 55 Goal status: met      LONG TERM GOALS: Target date: 02/17/22   Patient will demonstrate 5/5 Lt knee extensor strength to reduce buckling with walking.  Baseline: see above Goal status: INITIAL   2.  Patient will demonstrate 5/5 Lt ankle DF strength to improve ability to heel strike.  Baseline: see above  Goal status: INITIAL   3.  Patient will score at least 40/80 on the LEFS to signify clinically meaningful improvement in functional abilities.    Baseline: 27/80 Goal status: INITIAL   4.  Patient will complete 5 x STS </= 13 seconds to improve his functional strength and ease of transfers.  Baseline: see above  Goal status: INITIAL   5.  Patient will ambulate community distances without need for AD.  Baseline:  Goal status:  INITIAL     PLAN: PT FREQUENCY: 2x/week   PT DURATION: 6 weeks   PLANNED INTERVENTIONS: Therapeutic exercises, Therapeutic activity, Neuromuscular re-education, Balance training, Gait training, Patient/Family education, Self Care,  Manual therapy, and Re-evaluation   PLAN FOR NEXT SESSION: LLE stretching and strengthening; review and update HEP prn; balance training    Gwendolyn Grant, PT, DPT, ATC 01/17/22 4:19 PM

## 2022-01-18 ENCOUNTER — Other Ambulatory Visit: Payer: Self-pay | Admitting: Physician Assistant

## 2022-01-18 DIAGNOSIS — I82451 Acute embolism and thrombosis of right peroneal vein: Secondary | ICD-10-CM

## 2022-01-18 DIAGNOSIS — Z86711 Personal history of pulmonary embolism: Secondary | ICD-10-CM

## 2022-01-19 ENCOUNTER — Telehealth: Payer: Self-pay | Admitting: Pharmacy Technician

## 2022-01-19 ENCOUNTER — Inpatient Hospital Stay (HOSPITAL_BASED_OUTPATIENT_CLINIC_OR_DEPARTMENT_OTHER): Payer: Medicaid Other | Admitting: Physician Assistant

## 2022-01-19 ENCOUNTER — Inpatient Hospital Stay: Payer: Medicaid Other | Attending: Physician Assistant

## 2022-01-19 ENCOUNTER — Other Ambulatory Visit: Payer: Self-pay | Admitting: Physician Assistant

## 2022-01-19 ENCOUNTER — Other Ambulatory Visit: Payer: Self-pay

## 2022-01-19 ENCOUNTER — Other Ambulatory Visit (HOSPITAL_COMMUNITY): Payer: Self-pay

## 2022-01-19 VITALS — BP 105/80 | HR 95 | Temp 100.1°F | Resp 16 | Wt 227.2 lb

## 2022-01-19 DIAGNOSIS — I82451 Acute embolism and thrombosis of right peroneal vein: Secondary | ICD-10-CM | POA: Diagnosis not present

## 2022-01-19 DIAGNOSIS — Z86711 Personal history of pulmonary embolism: Secondary | ICD-10-CM

## 2022-01-19 DIAGNOSIS — Z7901 Long term (current) use of anticoagulants: Secondary | ICD-10-CM | POA: Diagnosis not present

## 2022-01-19 LAB — CBC WITH DIFFERENTIAL (CANCER CENTER ONLY)
Abs Immature Granulocytes: 0.02 10*3/uL (ref 0.00–0.07)
Basophils Absolute: 0.1 10*3/uL (ref 0.0–0.1)
Basophils Relative: 1 %
Eosinophils Absolute: 0.2 10*3/uL (ref 0.0–0.5)
Eosinophils Relative: 2 %
HCT: 36.9 % — ABNORMAL LOW (ref 39.0–52.0)
Hemoglobin: 12.2 g/dL — ABNORMAL LOW (ref 13.0–17.0)
Immature Granulocytes: 0 %
Lymphocytes Relative: 30 %
Lymphs Abs: 2.3 10*3/uL (ref 0.7–4.0)
MCH: 29.3 pg (ref 26.0–34.0)
MCHC: 33.1 g/dL (ref 30.0–36.0)
MCV: 88.7 fL (ref 80.0–100.0)
Monocytes Absolute: 0.5 10*3/uL (ref 0.1–1.0)
Monocytes Relative: 7 %
Neutro Abs: 4.5 10*3/uL (ref 1.7–7.7)
Neutrophils Relative %: 60 %
Platelet Count: 248 10*3/uL (ref 150–400)
RBC: 4.16 MIL/uL — ABNORMAL LOW (ref 4.22–5.81)
RDW: 15.6 % — ABNORMAL HIGH (ref 11.5–15.5)
WBC Count: 7.6 10*3/uL (ref 4.0–10.5)
nRBC: 0 % (ref 0.0–0.2)

## 2022-01-19 LAB — CMP (CANCER CENTER ONLY)
ALT: 15 U/L (ref 0–44)
AST: 11 U/L — ABNORMAL LOW (ref 15–41)
Albumin: 4.1 g/dL (ref 3.5–5.0)
Alkaline Phosphatase: 83 U/L (ref 38–126)
Anion gap: 4 — ABNORMAL LOW (ref 5–15)
BUN: 8 mg/dL (ref 6–20)
CO2: 29 mmol/L (ref 22–32)
Calcium: 8.7 mg/dL — ABNORMAL LOW (ref 8.9–10.3)
Chloride: 109 mmol/L (ref 98–111)
Creatinine: 1.19 mg/dL (ref 0.61–1.24)
GFR, Estimated: >60 mL/min (ref >60)
Glucose, Bld: 125 mg/dL — ABNORMAL HIGH (ref 70–99)
Potassium: 3.5 mmol/L (ref 3.5–5.1)
Sodium: 142 mmol/L (ref 135–145)
Total Bilirubin: 0.3 mg/dL (ref 0.3–1.2)
Total Protein: 7 g/dL (ref 6.5–8.1)

## 2022-01-19 MED ORDER — DABIGATRAN ETEXILATE MESYLATE 150 MG PO CAPS: 150.0000 mg | ORAL_CAPSULE | Freq: Two times a day (BID) | ORAL | 0 refills | Status: DC

## 2022-01-19 MED ORDER — ENOXAPARIN SODIUM 100 MG/ML IJ SOSY: 100.0000 mg | PREFILLED_SYRINGE | Freq: Two times a day (BID) | INTRAMUSCULAR | 0 refills | Status: DC

## 2022-01-19 NOTE — Progress Notes (Signed)
Pradaxa requires a PA so Dr. Lorenso Courier recommend Lovenox bridge until authorization can be obtained. I sent prescription for Lovenox 1 mg/kg q 12 hour x 14 days. Patient is aware to pick up prescription today.

## 2022-01-19 NOTE — Telephone Encounter (Signed)
Received notification from [place] regarding a prior authorization for  Pradaxa 150mg  . Authorization has been APPROVED from 01/19/22 to 01/19/23.   Per test claim, copay for 90 days supply is $4.00 with billed with patient's Medicaid insurance.  Authorization # Key: [id number] - PA Case ID: [id number]  Called [facility] and they received a paid claim.

## 2022-01-19 NOTE — Progress Notes (Signed)
[facility] [phone]   [phone]  PROGRESS NOTE  Patient Care Team: [facility] as PCP - General (Internal Medicine)  Hematological/Oncological History 1) March 2009: Diagnosed with pulmonary embolism with right heart strain. Started on anticoagulation with coumadin 2) October 2014: Developed subdural hematoma. INR 5.07 while on coumadin. Neurosurgery requested to hold anticoagulation. Coagulapathy was reversed with vitamin K and FFP. IV filter was placed by IR.  3) November 2014: Underwent right front parietal craniotomy with subdural hematoma evacuation. 4) 09/23/2021: While on Eliquis therapy (duration unknown), developed acute DVT in right peroneal veins. Switch to Lovenox 1 mg/kg twice daily 5) 10/12/2021: Returned to ED for worsening right leg pain. Repeat doppler US showed new clot progression further into the left peroneal vein as compared to 09/23/2021 study.  6) 10/18/2021: Establish care with [facility]ology with Dr. Leonides Schanz and Georga Kaufmann PA-C  -Labs showed  elevated beta-2-glycoprotein antibodies   -Switched to coumadin and INR levels managed by PCP.  7) 12/08/2021-12/10/2021: Admitted for spontaneous large left thigh intramuscular hematoma in the setting of supratherapeutic INR greater than 7.  Coumadin was discontinued and patient was switched to Eliquis.   CHIEF COMPLAINTS/PURPOSE OF CONSULTATION:  "History of PE and recent right lower extremity DVT "  HISTORY OF PRESENTING ILLNESS:  Justin [name] 56 y.o. male returns for a follow up for history of PE/DVT. He was recently diagnosed in September 2023 due to left thigh hematoma due to supratherapeutic INR greater than 7 while on coumadin. Patient was discontinued on coumadin and switched to Eliquis.  On exam today, Mr. Papadakis reports he has been tolerating Eliquis therapy without any issues. Pain and swelling in his left leg is improving but still causes some discomfort. He denies any  bleeding episodes or easy bruising. Patient reports his energy and appetite are stable. He denies fevers, chills, sweats, shortness of breath, chest pain or cough. He has no other complaints.  Rest of the 10 point ROS is below.  MEDICAL HISTORY:  Past Medical History:  Diagnosis Date   Achilles rupture, left    Bipolar 1 disorder (HCC)    Dental caries    periodontitis   Pneumonia    Pulmonary embolism (HCC) 05/18/2007   Sleep apnea    wears CPAP   Subdural hematoma (HCC) 01/04/2013   in setting of supratherapeutic INR    SURGICAL HISTORY: Past Surgical History:  Procedure Laterality Date   ACHILLES TENDON SURGERY Left 10/21/2014   Procedure: Left Achilles Reconstruction;  Surgeon: Nadara Mustard, MD;  Location: [facility] OR;  Service: Orthopedics;  Laterality: Left;   APPENDECTOMY     CARDIAC CATHETERIZATION  03/04/2018   [facility]: Normal coronaries, LVEF estimated at 40%, medical Rx   CRANIOTOMY N/A 01/19/2013   Procedure: CRANIOTOMY HEMATOMA EVACUATION SUBDURAL;  Surgeon: Hewitt Shorts, MD;  Location: [facility] NEURO ORS;  Service: Neurosurgery;  Laterality: N/A;   CYSTECTOMY     right head   ELBOW SURGERY     right   FRACTURE SURGERY     finger   I & D EXTREMITY Left 12/07/2016   Procedure: LEFT ACHILLES DEBRIDEMENT;  Surgeon: Nadara Mustard, MD;  Location: [facility] OR;  Service: Orthopedics;  Laterality: Left;   LUMBAR FUSION  12/23/2017   L5 GILL PROCEDURE, RIGHT L5-S1, TRANSFORAMIAL LUMBAR INTERBODY FUSION, BILATERAL LATERAL FUSION, PEDICLE INSTRUMENTATION   MULTIPLE EXTRACTIONS WITH ALVEOLOPLASTY N/A 03/07/2015   Procedure: MULTIPLE EXTRACTION WITH ALVEOLOPLASTY;  Surgeon: [name], DDS;  Location:  [facility] OR;  Service: Oral Surgery;  Laterality: N/A;   SPINAL CORD STIMULATOR INSERTION N/A 05/18/2019   Procedure: LUMBAR SPINAL CORD STIMULATOR INSERTION;  Surgeon: Odette Fraction, MD;  Location: [facility] OR;  Service: Neurosurgery;  Laterality: N/A;  Thoracic/Lumbar   SPINAL CORD STIMULATOR  INSERTION N/A 04/06/2020   Procedure: Revision of spinal cord stimulator;  Surgeon: Renaldo Fiddler, MD;  Location: [facility] OR;  Service: Neurosurgery;  Laterality: N/A;    SOCIAL HISTORY: Social History   Socioeconomic History   Marital status: Single    Spouse name: Not on file   Number of children: Not on file   Years of education: Not on file   Highest education level: Not on file  Occupational History   Occupation: disability  Tobacco Use   Smoking status: Never   Smokeless tobacco: Never  Vaping Use   Vaping Use: Never used  Substance and Sexual Activity   Alcohol use: No   Drug use: No   Sexual activity: Not on file  Other Topics Concern   Not on file  Social History Narrative   FROM [place]    Lives in a 2 story apartment.  His cousin is currently staying with him.  Has 4 children.  On disability for the past 10 years for pulmonary embolism, bipolar disease.  Education: high school.   Social Determinants of Health   Financial Resource Strain: Not on file  Food Insecurity: No Food Insecurity (12/22/2021)   Hunger Vital Sign    Worried About Running Out of Food in the Last Year: Never true    Ran Out of Food in the Last Year: Never true  Transportation Needs: No Transportation Needs (12/22/2021)   PRAPARE - Administrator, Civil Service (Medical): No    Lack of Transportation (Non-Medical): No  Physical Activity: Not on file  Stress: Not on file  Social Connections: Not on file  Intimate Partner Violence: Not At Risk (12/22/2021)   Humiliation, Afraid, Rape, and Kick questionnaire    Fear of Current or Ex-Partner: No    Emotionally Abused: No    Physically Abused: No    Sexually Abused: No    FAMILY HISTORY: Family History  Problem Relation Age of Onset   Hypertension Mother    Stroke Mother    Multiple sclerosis Sister    Down syndrome Son     ALLERGIES:  is allergic to lyrica [pregabalin].  MEDICATIONS:  Current Outpatient Medications   Medication Sig Dispense Refill   apixaban (ELIQUIS) 5 MG TABS tablet Take 1 tablet (5 mg total) by mouth 2 (two) times daily. 60 tablet 1   diclofenac Sodium (VOLTAREN) 1 % GEL Apply 4 g topically 4 (four) times daily as needed (pain).     doxepin (SINEQUAN) 75 MG capsule Take 75 mg by mouth at bedtime.     HYDROcodone-acetaminophen (NORCO) 7.5-325 MG tablet Take 0.5-1 tablets by mouth every 8 (eight) hours as needed for moderate pain or severe pain. 21 tablet 0   lithium 600 MG capsule Take 600 mg by mouth at bedtime.     mirtazapine (REMERON) 45 MG tablet Take 45 mg by mouth at bedtime.     QUEtiapine (SEROQUEL XR) 400 MG 24 hr tablet Take 800 mg by mouth at bedtime.     tiZANidine (ZANAFLEX) 4 MG tablet Take 4 mg by mouth at bedtime.      albuterol (VENTOLIN HFA) 108 (90 Base) MCG/ACT inhaler Inhale 1-2 puffs into the lungs every  6 (six) hours as needed for wheezing or shortness of breath. (Patient not taking: Reported on 12/09/2021) 1 each 0   cyclobenzaprine (FLEXERIL) 10 MG tablet Take 1 tablet (10 mg total) by mouth 3 (three) times daily as needed for muscle spasms. (Patient not taking: Reported on 01/19/2022) 30 tablet 0   dicyclomine (BENTYL) 10 MG capsule Take 10 mg by mouth 3 (three) times daily as needed for spasms. (Patient not taking: Reported on 01/19/2022)     gabapentin (NEURONTIN) 300 MG capsule Take 1 capsule (300 mg total) by mouth 3 (three) times daily for 14 days. 42 capsule 0   lithium carbonate (ESKALITH) 450 MG CR tablet Take 450 mg by mouth daily. Take 1 tablet in the morning. (Patient not taking: Reported on 01/19/2022)     No current facility-administered medications for this visit.    REVIEW OF SYSTEMS:   Constitutional: ( - ) fevers, ( - )  chills , ( - ) night sweats Eyes: ( - ) blurriness of vision, ( - ) double vision, ( - ) watery eyes Ears, nose, mouth, throat, and face: ( - ) mucositis, ( - ) sore throat Respiratory: ( - ) cough, ( - ) dyspnea, ( - )  wheezes Cardiovascular: ( - ) palpitation, ( +) chest discomfort, ( + ) lower extremity swelling Gastrointestinal:  ( - ) nausea, ( - ) heartburn, ( - ) change in bowel habits Skin: ( - ) abnormal skin rashes Lymphatics: ( - ) new lymphadenopathy, ( - ) easy bruising Neurological: ( - ) numbness, ( - ) tingling, ( - ) new weaknesses Behavioral/Psych: ( - ) mood change, ( - ) new changes  All other systems were reviewed with the patient and are negative.  PHYSICAL EXAMINATION: ECOG PERFORMANCE STATUS: 1 - Symptomatic but completely ambulatory  Vitals:   01/19/22 1011  BP: 105/80  Pulse: 95  Resp: 16  Temp: 100.1 F (37.8 C)  SpO2: 99%   Filed Weights   01/19/22 1011  Weight: 227 lb 3.2 oz (103.1 kg)    GENERAL: well appearing male in NAD  SKIN: skin color, texture, turgor are normal, no rashes or significant lesions EYES: conjunctiva are pink and non-injected, sclera clear NECK: supple, non-tender LUNGS: clear to auscultation and percussion with normal breathing effort HEART: regular rate & rhythm and no murmurs.  Musculoskeletal: no cyanosis of digits and no clubbing  PSYCH: alert & oriented x 3, fluent speech NEURO: no focal motor/sensory deficits  LABORATORY DATA:  I have reviewed the data as listed    Latest Ref Rng & Units 01/19/2022   10:05 AM 12/24/2021   10:18 PM 12/24/2021    2:48 AM  CBC  WBC 4.0 - 10.5 K/uL 7.6  10.4  9.6   Hemoglobin 13.0 - 17.0 g/dL 16.1  09.6  04.5   Hematocrit 39.0 - 52.0 % 36.9  31.7  30.3   Platelets 150 - 400 K/uL 248  351  347        Latest Ref Rng & Units 12/24/2021   10:18 PM 12/24/2021    2:48 AM 12/22/2021    6:20 PM  CMP  Glucose 70 - 99 mg/dL 409  811  914   BUN 6 - 20 mg/dL Creatinine 0.61 - 1.24 mg/dL 7.82  9.56  2.13   Sodium 135 - 145 mmol/L 137  139  139   Potassium 3.5 - 5.1 mmol/L 3.7  4.1  4.4   Chloride 98 - 111 mmol/L 108  107  108   CO2 22 - 32 mmol/L Calcium 8.9 - 10.3 mg/dL 8.8  9.1   8.6   Total Protein 6.5 - 8.1 g/dL   6.6   Total Bilirubin 0.3 - 1.2 mg/dL   0.8   Alkaline Phos 38 - 126 U/L   79   AST 15 - 41 U/L   18   ALT 0 - 44 U/L   22    RADIOGRAPHIC STUDIES: I have personally reviewed the radiological images as listed and agreed with the findings in the report. VAS [place] LOWER EXTREMITY VENOUS (DVT)  Result Date: 12/25/2021  Lower Venous DVT Study Patient Name:  Justin [name]  Date of Exam:   12/24/2021 Medical Rec #: [id number]       Accession #:    [id number] Date of Birth: 02-25-1966       Patient Gender: M Patient Age:   89 years Exam Location:  [facility] Procedure:      VAS [place] LOWER EXTREMITY VENOUS (DVT) Referring Phys: Stephania Fragmin --------------------------------------------------------------------------------  Indications: Pain- patient endorses burning pain with ambulation which he states is unchanged since previous examination. Provider concern for "knot" on lateral aspect of the thigh. Other Indications: History of peroneal and gastrocnemius DVT in the right calf                    on 09/23/2021 which appeared chronic on 10/27/2021 and was                    resolved on examination on 11/30/2021. Anticoagulation: Heparin. Comparison Study: Multiple prior examinations. Most recent, 12/16/2021 left                   lower extremity venous study performed for burning pain with                   ambulation was negative for DVT.                    12/21/2021 CTA of the left leg showed no evidence of DVT. "As                   compared to the study of 12/09/2021, large collection in the                   vastus lateralis muscle appears more organized and is more                   complex, with numerous enhancing septations and multiple tiny                   fluid pockets. The appearance is most suggestive of a                   pyomyositis although component of hematoma could still be                   present." Performing Technologist: Jean Rosenthal RDMS, RVT   Examination Guidelines: A complete evaluation includes B-mode imaging, spectral Doppler, color Doppler, and power Doppler as needed of all accessible portions of each vessel. Bilateral testing is considered an integral part of a complete examination. Limited examinations for reoccurring indications may be performed as noted. The reflux portion of the exam is performed with the patient in  reverse Trendelenburg.  +-----+---------------+---------+-----------+----------+--------------+ RIGHTCompressibilityPhasicitySpontaneityPropertiesThrombus Aging +-----+---------------+---------+-----------+----------+--------------+ CFV  Full           Yes      Yes                                 +-----+---------------+---------+-----------+----------+--------------+   +---------+---------------+---------+-----------+----------+--------------+ LEFT     CompressibilityPhasicitySpontaneityPropertiesThrombus Aging +---------+---------------+---------+-----------+----------+--------------+ CFV      Full           Yes      Yes                                 +---------+---------------+---------+-----------+----------+--------------+ SFJ      Full                                                        +---------+---------------+---------+-----------+----------+--------------+ FV Prox  Full                                                        +---------+---------------+---------+-----------+----------+--------------+ FV Mid   Full                                                        +---------+---------------+---------+-----------+----------+--------------+ FV DistalFull                                                        +---------+---------------+---------+-----------+----------+--------------+ PFV      Full                                                        +---------+---------------+---------+-----------+----------+--------------+ POP      Full           Yes       Yes                                 +---------+---------------+---------+-----------+----------+--------------+ PTV      Full                                                        +---------+---------------+---------+-----------+----------+--------------+ PERO     Full                                                        +---------+---------------+---------+-----------+----------+--------------+  Gastroc  Full                                                        +---------+---------------+---------+-----------+----------+--------------+     Summary: RIGHT: - No evidence of common femoral vein obstruction.  LEFT: - Findings appear essentially unchanged compared to previous examination on 12/16/2021.  - There is no evidence of deep vein thrombosis in the lower extremity.  - A cystic structure is found in the popliteal fossa. 2.9 cm.  - No masses visualized on today's examination; however, lateral thigh mass visualized on CTA on 12/21/2021 is outside of the venous pathway and the scope of this examination. Soft tissue imaging may be warranted as clinically indicated.  *See table(s) above for measurements and observations. Electronically signed by Sherald Hess MD on 12/25/2021 at 10:44:06 AM.    Final    CT EXTREMITY LOWER LEFT W CONTRAST  Result Date: 12/22/2021 CLINICAL DATA:  Worsening swelling in the left thigh. EXAM: CT ANGIOGRAPHY OF THE LEFT LOWER EXTREMITY; CT OF THE LEFT LOWER EXTREMITY WITH CONTRAST TECHNIQUE: Multidetector CTA imaging of the entire left lower extremity was performed using the standard protocol during bolus administration of intravenous contrast. Multiplanar CT image reconstructions and MIPs were obtained to evaluate the vascular anatomy. Subsequently, multidetector CT imaging of the entire left lower extremity was repeated and the combined arterial and venous phase using the standard protocol, with multiplanar CT image reconstructions including MIP  RADIATION DOSE REDUCTION: This exam was performed according to the departmental dose-optimization program which includes automated exposure control, adjustment of the mA and/or kV according to patient size and/or use of iterative reconstruction technique. CONTRAST:  80mL OMNIPAQUE IOHEXOL 350 MG/ML SOLN COMPARISON:  Left thigh and femoral CT 12/09/2021, with contrast. FINDINGS: CT ANGIOGRAPHY LEFT LOWER EXTREMITY FINDINGS Inflow: Only the most distal extent of the left common iliac artery is included in the scan and is patent. The internal and external iliac arteries widely patent. No aneurysm or dissection is seen. There are scattered trace calcifications in the left internal iliac artery. Outflow: There are no visible plaques, stenoses, dissections or aneurysms of the left common femoral, profunda femoral, medial and lateral circumflex femoral and superficial femoral arteries. The popliteal artery is also normal. Runoff: Patent three-vessel runoff into the foot. There are scattered trace calcifications beginning to develop in the anterior and posterior tibial arteries. Left hemipelvis: Mild prostatomegaly. No significant thickening of the visualized bladder. Left hemipelvis demonstrates no free fluid, free hemorrhage, free air or new adenopathy. Again noted is a mildly enlarged left external iliac chain node measuring 3.5 x 1.2 cm. There are slightly prominent left inguinal chain nodes up to 1.1 cm in short axis which were not previously enlarged. There is no incarcerated hernia. Both testicles are in the scrotal sac. CT LEFT LOWER EXTREMITY WITH CONTRAST FINDINGS Vascular: There is no appreciable deep vein thrombosis. Bones/joints/cartilage: No fracture, destructive bone lesion or aggressive periostitis is seen in the left lower extremity. In the anterior superior left femoral head, there is a well-circumscribed, chronic appearing lesion compatible with osteonecrosis measuring 2.3 x 1.4 x 1.2 cm, with a thin  sclerotic border without evidence of subsidence of the overlying cortex, and a homogeneous ground-glass matrix. There is early nonerosive degenerative arthrosis of the medial femorotibial joint and lateral patellofemoral joint in knee and  trace suprapatellar bursal fluid probably physiologic, without popliteal cyst. There is no significant arthrosis in the foot and ankle. Ligaments: Suboptimally assessed by CT. Muscles and tendons: There is a large complex heterogeneous collection in the vastus lateralis, primarily the outer aspect of the muscle, characterized by innumerable rim enhancing septations and tiny fluid locules. Maximum measurements are 21 cm craniocaudal, 3.3 cm transversely and 8.9 cm AP. 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 There is no appreciable collection in the remainder of the thigh muscles but there is edema in the adjacent deep soft tissue planes in the anterior thigh compartment. There is no soft tissue gas but certainly early necrotizing fasciitis is not excluded. The musculature and deep soft tissues of the gluteal compartments, dorsal thigh compartment, and foreleg are unremarkable as are the visualized tendons. Soft tissues: Subcutaneous edema continues to be seen in the thigh and extends into the foreleg. There is scattered nonlocalizing fluid along the fat muscle interfaces but no subcutaneous abscess or soft tissue gas. A 1.7 cm soft tissue nodule is stably redemonstrated in the left ischioanal fossa, nonspecific. This is best seen on series 4 axial 85 of the CT lower extremity study. Review of the MIP images confirms the above findings. IMPRESSION: 1. No arterial stenosis.  No findings of deep venous thrombosis. 2. Trace calcific plaques in the left internal iliac artery and scattered along the left anterior and posterior tibial arteries. 3. As compared to the study of 12/09/2021, large collection in the vastus lateralis muscle appears more organized and is more complex,  with numerous enhancing septations and multiple tiny fluid pockets. The appearance is most suggestive of a pyomyositis although component of hematoma could still be present. No soft tissue gas is seen but there is edema in the anterior compartment deep soft tissue planes which can be seen with fasciitis. 4. Generalized left lower extremity edema. No subcutaneous abscess or soft tissue gas. 5. 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 No overlying cortical subsidence or significant findings of secondary hip DJD. 6. 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 7. Stable 1.7 cm soft tissue nodule in the left ischioanal fossa subcutaneous fat. Nonspecific. Follow-up as indicated. 8. Mildly enlarged lymph nodes in the left external iliac and inguinal chains probably reactive. 9. Mild prostatomegaly. Electronically Signed   By: Almira Bar M.D.   On: 12/22/2021 00:51   CT ANGIO LOW EXTREM LEFT W &/OR WO CONTRAST  Result Date: 12/22/2021 CLINICAL DATA:  Worsening swelling in the left thigh. EXAM: CT ANGIOGRAPHY OF THE LEFT LOWER EXTREMITY; CT OF THE LEFT LOWER EXTREMITY WITH CONTRAST TECHNIQUE: Multidetector CTA imaging of the entire left lower extremity was performed using the standard protocol during bolus administration of intravenous contrast. Multiplanar CT image reconstructions and MIPs were obtained to evaluate the vascular anatomy. Subsequently, multidetector CT imaging of the entire left lower extremity was repeated and the combined arterial and venous phase using the standard protocol, with multiplanar CT image reconstructions including MIP RADIATION DOSE REDUCTION: This exam was performed according to the departmental dose-optimization program which includes automated exposure control, adjustment of the mA and/or kV according to patient size and/or use of iterative reconstruction technique. CONTRAST:  80mL OMNIPAQUE IOHEXOL 350 MG/ML SOLN COMPARISON:  Left thigh and femoral CT 12/09/2021, with contrast.  FINDINGS: CT ANGIOGRAPHY LEFT LOWER EXTREMITY FINDINGS Inflow: Only the most distal extent of the left common iliac artery is included in the scan and is patent. The internal and external iliac arteries widely patent. No  aneurysm or dissection is seen. There are scattered trace calcifications in the left internal iliac artery. Outflow: There are no visible plaques, stenoses, dissections or aneurysms of the left common femoral, profunda femoral, medial and lateral circumflex femoral and superficial femoral arteries. The popliteal artery is also normal. Runoff: Patent three-vessel runoff into the foot. There are scattered trace calcifications beginning to develop in the anterior and posterior tibial arteries. Left hemipelvis: Mild prostatomegaly. No significant thickening of the visualized bladder. Left hemipelvis demonstrates no free fluid, free hemorrhage, free air or new adenopathy. Again noted is a mildly enlarged left external iliac chain node measuring 3.5 x 1.2 cm. There are slightly prominent left inguinal chain nodes up to 1.1 cm in short axis which were not previously enlarged. There is no incarcerated hernia. Both testicles are in the scrotal sac. CT LEFT LOWER EXTREMITY WITH CONTRAST FINDINGS Vascular: There is no appreciable deep vein thrombosis. Bones/joints/cartilage: No fracture, destructive bone lesion or aggressive periostitis is seen in the left lower extremity. In the anterior superior left femoral head, there is a well-circumscribed, chronic appearing lesion compatible with osteonecrosis measuring 2.3 x 1.4 x 1.2 cm, with a thin sclerotic border without evidence of subsidence of the overlying cortex, and a homogeneous ground-glass matrix. There is early nonerosive degenerative arthrosis of the medial femorotibial joint and lateral patellofemoral joint in knee and trace suprapatellar bursal fluid probably physiologic, without popliteal cyst. There is no significant arthrosis in the foot and ankle.  Ligaments: Suboptimally assessed by CT. Muscles and tendons: There is a large complex heterogeneous collection in the vastus lateralis, primarily the outer aspect of the muscle, characterized by innumerable rim enhancing septations and tiny fluid locules. Maximum measurements are 21 cm craniocaudal, 3.3 cm transversely and 8.9 cm AP. 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 There is no appreciable collection in the remainder of the thigh muscles but there is edema in the adjacent deep soft tissue planes in the anterior thigh compartment. There is no soft tissue gas but certainly early necrotizing fasciitis is not excluded. The musculature and deep soft tissues of the gluteal compartments, dorsal thigh compartment, and foreleg are unremarkable as are the visualized tendons. Soft tissues: Subcutaneous edema continues to be seen in the thigh and extends into the foreleg. There is scattered nonlocalizing fluid along the fat muscle interfaces but no subcutaneous abscess or soft tissue gas. A 1.7 cm soft tissue nodule is stably redemonstrated in the left ischioanal fossa, nonspecific. This is best seen on series 4 axial 85 of the CT lower extremity study. Review of the MIP images confirms the above findings. IMPRESSION: 1. No arterial stenosis.  No findings of deep venous thrombosis. 2. Trace calcific plaques in the left internal iliac artery and scattered along the left anterior and posterior tibial arteries. 3. As compared to the study of 12/09/2021, large collection in the vastus lateralis muscle appears more organized and is more complex, with numerous enhancing septations and multiple tiny fluid pockets. The appearance is most suggestive of a pyomyositis although component of hematoma could still be present. No soft tissue gas is seen but there is edema in the anterior compartment deep soft tissue planes which can be seen with fasciitis. 4. Generalized left lower extremity edema. No subcutaneous abscess or  soft tissue gas. 5. 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 No overlying cortical subsidence or significant findings of secondary hip DJD. 6. 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 7. Stable 1.7 cm soft tissue nodule in the left  ischioanal fossa subcutaneous fat. Nonspecific. Follow-up as indicated. 8. Mildly enlarged lymph nodes in the left external iliac and inguinal chains probably reactive. 9. Mild prostatomegaly. Electronically Signed   By: Telford Nab M.D.   On: 12/22/2021 00:51    ASSESSMENT & PLAN Justin [name] is a 56 y.o. male who presents to the clinc for history of pulmonary embolism and recent acute DVT of right lower extremity. We reviewed provoking factors that can cause VTEs includin prolonged travel/immobility, surgery (particular abdominal or orthropedic), trauma,  and pregnancy/ estrogen containing birth control. After a detailed history and review of the records there is no clear provoking factor for this patient's VTE.  Patients with unprovoked VTEs have up to 25% recurrence after 5 years and 36% at 10 years, with 4% of these clots being fatal (BMJ [phone]). Therefore the formal recommendation for unprovoked VTE's is lifelong anticoagulation, as the cause may not be transient or reversible.   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  --Since there is evidence of clot progression seen on 10/12/2021 while on Lovenox therapy, patient was switched to Coumadin.  --In September 2023, patient was found to have spontaneous large left thigh intramuscular hematoma in the setting of supratherapeutic INR greater than 7.  Coumadin was discontinued and patient was switched to Eliquis.  --Labs today were reviewed. There is improvement of anemia with Hgg 12.2. Normal creatinine and LFTs.  --Discussed that since  patient failed Eliquis therapy in the past and unable to reach therapeutic dose with Coumadin, recommend Pradaxa 150 mg twice daily. Sent prescription to pharmacy on file. If there is difficulty with insurance clearance or cost of medication, patient can bridge with Lovenox.  --Labs from 10/18/2021 showed elevated beta-2 glycoprotein antibodies, IgM 71 units, IgA 95 units. Need to repeat at next visit to determine if patient meets criteria for APS.  --RTC in 3 months' time with strict return precautions for overt signs of bleeding.   Case was discussed with Dr. Wilber Bihari who was in agreement with plan  No orders of the defined types were placed in this encounter.   All questions were answered. The patient knows to call the clinic with any problems, questions or concerns.  I have spent a total of 30 minutes minutes of face-to-face and non-face-to-face time, preparing to see the patient,  performing a medically appropriate examination, counseling and educating the patient, ordering medications/test,  documenting clinical information in the electronic health record and care coordination.   Dede Query, PA-C Department of Hematology/Oncology [place] [facility] Phone: [phone]

## 2022-01-22 ENCOUNTER — Ambulatory Visit: Payer: Medicaid Other

## 2022-01-22 DIAGNOSIS — M79605 Pain in left leg: Secondary | ICD-10-CM

## 2022-01-22 DIAGNOSIS — R2689 Other abnormalities of gait and mobility: Secondary | ICD-10-CM | POA: Diagnosis not present

## 2022-01-22 DIAGNOSIS — M6281 Muscle weakness (generalized): Secondary | ICD-10-CM

## 2022-01-22 NOTE — Therapy (Signed)
OUTPATIENT PHYSICAL THERAPY TREATMENT NOTE   Patient Name: Justin [name] MRN: [id number] DOB:February 17, 1966, 56 y.o., male Today's Date: 01/22/2022  PCP: [facility] REFERRING PROVIDER: Damita Lack, MD  END OF SESSION:   PT End of Session - 01/22/22 1500     Visit Number 4    Number of Visits 13    Date for PT Re-Evaluation 02/17/22    Authorization Type MCD    Authorization Time Period 10/24-12/4/23    Authorization - Visit Number 3    Authorization - Number of Visits 12    PT Start Time 1500    PT Stop Time 1543    PT Time Calculation (min) 43 min    Activity Tolerance Patient tolerated treatment well    Behavior During Therapy [facility] for tasks assessed/performed               Past Medical History:  Diagnosis Date   Achilles rupture, left    Bipolar 1 disorder ([place])    Dental caries    periodontitis   Pneumonia    Pulmonary embolism ([place]) 05/18/2007   Sleep apnea    wears CPAP   Subdural hematoma ([place]) 01/04/2013   in setting of supratherapeutic INR   Past Surgical History:  Procedure Laterality Date   ACHILLES TENDON SURGERY Left 10/21/2014   Procedure: Left Achilles Reconstruction;  Surgeon: Newt Minion, MD;  Location: [place];  Service: Orthopedics;  Laterality: Left;   APPENDECTOMY     CARDIAC CATHETERIZATION  03/04/2018   [facility]: Normal coronaries, LVEF estimated at 40%, medical Rx   CRANIOTOMY N/A 01/19/2013   Procedure: CRANIOTOMY HEMATOMA EVACUATION SUBDURAL;  Surgeon: Hosie Spangle, MD;  Location: [place] [facility];  Service: Neurosurgery;  Laterality: N/A;   CYSTECTOMY     right head   ELBOW SURGERY     right   FRACTURE SURGERY     finger   I & D EXTREMITY Left 12/07/2016   Procedure: LEFT ACHILLES DEBRIDEMENT;  Surgeon: Newt Minion, MD;  Location: [place];  Service: Orthopedics;  Laterality: Left;   LUMBAR FUSION  12/23/2017   L5 GILL PROCEDURE, RIGHT L5-S1, TRANSFORAMIAL LUMBAR INTERBODY FUSION, BILATERAL LATERAL FUSION,  PEDICLE INSTRUMENTATION   MULTIPLE EXTRACTIONS WITH ALVEOLOPLASTY N/A 03/07/2015   Procedure: MULTIPLE EXTRACTION WITH ALVEOLOPLASTY;  Surgeon: Diona Browner, DDS;  Location: [place];  Service: Oral Surgery;  Laterality: N/A;   SPINAL CORD STIMULATOR INSERTION N/A 05/18/2019   Procedure: LUMBAR SPINAL CORD STIMULATOR INSERTION;  Surgeon: Clydell Hakim, MD;  Location: [place];  Service: Neurosurgery;  Laterality: N/A;  Thoracic/Lumbar   SPINAL CORD STIMULATOR INSERTION N/A 04/06/2020   Procedure: Revision of spinal cord stimulator;  Surgeon: Reece Agar, MD;  Location: [place];  Service: Neurosurgery;  Laterality: N/A;   Patient Active Problem List   Diagnosis Date Noted   Hematoma of left thigh    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   Leukocytosis 12/22/2021   SIRS (systemic inflammatory response syndrome) ([place]) 12/22/2021   Normocytic anemia 12/22/2021   Hematoma 12/09/2021   Acute deep vein thrombosis (DVT) of right peroneal vein ([place]) 10/19/2021   History of pulmonary embolism 10/19/2021   Status post lumbar spinal fusion 01/03/2018   Lumbar stenosis 12/23/2017   Spondylolisthesis, lumbar region 06/25/2017   Achilles tendinitis of left lower extremity 12/07/2016   Achilles tendinitis, left leg 05/17/2016   Obstructive sleep apnea on CPAP 03/07/2015   Post-operative state 03/07/2015   Achilles rupture, left 10/21/2014   OSA (obstructive  sleep apnea) 04/23/2013   Bipolar disorder, unspecified ([place]) 01/07/2013   Sinus tachycardia 01/07/2013   Headache(784.0) 01/07/2013   SDH (subdural hematoma) ([place]) 01/04/2013   H/O PE 01/04/2013   Chronic anticoagulation 01/04/2013   Warfarin-induced coagulopathy ([place]) 01/04/2013   Acute sinusitis 01/04/2013    REFERRING DIAG: L89.21JH (ICD-10-CM) - Hematoma of left thigh, initial encounter  THERAPY DIAG:  Other abnormalities of gait and mobility  Muscle weakness (generalized)  Pain of left lower extremity  Rationale for Evaluation and Treatment  Rehabilitation  PERTINENT HISTORY:  Bipolar disorder Pulmonary embolism  Subdural hematoma Lumbar fusion  Spinal cord stimulator   PRECAUTIONS: Fall; history of DVT/PE on anti-coags  SUBJECTIVE:                                                                                                                                                                                      SUBJECTIVE STATEMENT:  Patient reports the leg is still bothering him and still painful.   PAIN:  Are you having pain? Yes: NPRS scale: 7/10 Pain location: Lt calf Pain description: tight[place] Aggravating factors: walking Relieving factors: laying down   OBJECTIVE: (objective measures completed at initial evaluation unless otherwise dated)  DIAGNOSTIC FINDINGS:  LLE CT scan: IMPRESSION: 1. No arterial stenosis.  No findings of deep venous thrombosis. 2. Trace calcific plaques in the left internal iliac artery and scattered along the left anterior and posterior tibial arteries. 3. As compared to the study of 12/09/2021, large collection in the vastus lateralis muscle appears more organized and is more complex, with numerous enhancing septations and multiple tiny fluid pockets. The appearance is most suggestive of a pyomyositis although component of hematoma could still be present. No soft tissue gas is seen but there is edema in the anterior compartment deep soft tissue planes which can be seen with fasciitis. 4. Generalized left lower extremity edema. No subcutaneous abscess or soft tissue gas. 5. 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 No overlying cortical subsidence or significant findings of secondary hip DJD. 6. 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 7. Stable 1.7 cm soft tissue nodule in the left ischioanal fossa subcutaneous fat. Nonspecific. Follow-up as indicated. 8. Mildly enlarged lymph nodes in the left external iliac and inguinal chains probably reactive. 9. Mild prostatomegaly.      PATIENT SURVEYS:  LEFS 27/80   COGNITION:           Overall cognitive status: Within functional limits for tasks assessed                          SENSATION: WFL   EDEMA:  Diffuse  swelling about LLE; discoloration about distal LLE   MUSCLE LENGTH: Hamstrings: lacking 55 Lt; lacking 20 Rt  01/15/22: Lt lacking 35 degrees    POSTURE: No Significant postural limitations   PALPATION: Diffuse tenderness about left lower leg and quad    LOWER EXTREMITY ROM:   Active ROM Right eval Left eval  Hip flexion      Hip extension      Hip abduction      Hip adduction      Hip internal rotation      Hip external rotation      Knee flexion 128 120  Knee extension WNL WNL  Ankle dorsiflexion 8 0  Ankle plantarflexion      Ankle inversion      Ankle eversion       (Blank rows = not tested)   LOWER EXTREMITY MMT:   MMT Right eval Left eval  Hip flexion 5 5  Hip extension      Hip abduction      Hip adduction      Hip internal rotation      Hip external rotation      Knee flexion 5 5  Knee extension 5 4-  Ankle dorsiflexion 5 3-  Ankle plantarflexion      Ankle inversion      Ankle eversion       (Blank rows = not tested)   LOWER EXTREMITY SPECIAL TESTS:  (+) [place]    FUNCTIONAL TESTS:  TUG 14.5 seconds 5 x STS: 20 seconds  SLS: 6 seconds LLE; 9 seconds RLE    GAIT: Distance walked: 25 ft Assistive device utilized: None Level of assistance: Complete Independence Comments: occasional buckling of the Lt knee; foot flat initial contact.        TODAY'S TREATMENT: [facility] Adult PT Treatment:                                                DATE: 01/22/22 Therapeutic Exercise: Bike level 2 x 5 minutes  Calf stretch on wedge x 60 seconds  HS stretch with strap x 60 seconds Quad stretch with strap x 60 seconds  Sit to stand 2 x 10  TKE blue band 2 x 10  LAQ 2 x 10; 4 lbs  Standing calf raise 2 x 15  Standing toe raises 2 x 15    [facility] Adult PT Treatment:                                                 DATE: 01/17/22 Therapeutic Exercise: HS stretch with strap x 60 seconds  IT band stretch with strap x 60 seconds  Quad stretch with strap x 60 seconds  Calf stretch on slantboard x 60 seconds  Standing calf raise 2 x 10  SLR 2 x 10  Hip bridge 2 x 10  Sidelying hip abduction 2 x 10  LAQ 2 x 10; 2# Updated HEP    [place] Adult PT Treatment:  DATE: 01/15/22 Therapeutic Exercise: HS stretch with strap 2 x 30 sec  IT band stretch 2 x 30 sec  Calf stretch with strap 2 x 30 sec  Quad stretch with strap 2 x 30 sec  Heel slides on stability ball x 10  Rockerboard A/P 2 x 10  4 way ankle Reichow band 1 x 10 Ankle circles CW/CCW x 10 each  Resisted HS curl 2 x 10 Ta[place] band Updated HEP             PATIENT EDUCATION:  Education details: n/a Person educated: n/a Education method: n/a Education comprehension: n/a     HOME EXERCISE PROGRAM: Access Code: [email or web address] URL: [email or web address] Date: 01/05/2022 Prepared by: Gwendolyn Grant   Exercises - Seated Long Arc Quad  - 2 x daily - 7 x weekly - 2 sets - 10 reps - Seated Toe Raise  - 2 x daily - 7 x weekly - 2 sets - 10 reps - Long Sitting Calf Stretch with Strap  - 2 x daily - 7 x weekly - 3 sets - 30 sec  hold - Seated Hamstring Stretch  - 2 x daily - 7 x weekly - 3 sets - 30 sec  hold   ASSESSMENT:   CLINICAL IMPRESSION: Patient arrives with ongoing LLE pain localized to the calf at this time. Focused on progression of LLE strength with good tolerance. He quickly fatigues with targeted quad strengthening. With standing activity he reported occasional back pain, but otherwise no issues. He reported muscle soreness at conclusion of session.      OBJECTIVE IMPAIRMENTS Abnormal gait, decreased activity tolerance, decreased balance, decreased knowledge of condition, difficulty walking, decreased ROM, decreased strength, impaired  flexibility, improper body mechanics, and pain.    ACTIVITY LIMITATIONS carrying, lifting, bending, standing, squatting, stairs, transfers, and locomotion level   PARTICIPATION LIMITATIONS: meal prep, cleaning, laundry, shopping, community activity, and yard work   PERSONAL FACTORS Age, Time since onset of injury/illness/exacerbation, and 3+ comorbidities: see PMH above   are also affecting patient's functional outcome.    REHAB POTENTIAL: Good   CLINICAL DECISION MAKING: Evolving/moderate complexity   EVALUATION COMPLEXITY: Moderate     GOALS: Goals reviewed with patient? Yes   SHORT TERM GOALS: Target date: 01/26/22 Patient will be independent and compliant with initial HEP.    Baseline: issued at eval  Goal status: INITIAL   2.  Patient will demonstrate at least 5 degrees of Lt ankle DF AROM to improve gait mechanics.  Baseline: 0 Goal status: INITIAL   3.  Patient will improve Lt hamstring flexibility by at least 15 degrees to reduce stress on the LLE with walking.  Baseline: lacking 55 Goal status: met      LONG TERM GOALS: Target date: 02/17/22   Patient will demonstrate 5/5 Lt knee extensor strength to reduce buckling with walking.  Baseline: see above Goal status: INITIAL   2.  Patient will demonstrate 5/5 Lt ankle DF strength to improve ability to heel strike.  Baseline: see above  Goal status: INITIAL   3.  Patient will score at least 40/80 on the LEFS to signify clinically meaningful improvement in functional abilities.    Baseline: 27/80 Goal status: INITIAL   4.  Patient will complete 5 x STS </= 13 seconds to improve his functional strength and ease of transfers.  Baseline: see above  Goal status: INITIAL   5.  Patient will ambulate community distances without need for AD.  Baseline:  Goal status: INITIAL  PLAN: PT FREQUENCY: 2x/week   PT DURATION: 6 weeks   PLANNED INTERVENTIONS: Therapeutic exercises, Therapeutic activity, Neuromuscular  re-education, Balance training, Gait training, Patient/Family education, Self Care, Manual therapy, and Re-evaluation   PLAN FOR NEXT SESSION: LLE stretching and strengthening; review and update HEP prn; balance training    Gwendolyn Grant, PT, DPT, ATC 01/22/22 3:48 PM

## 2022-01-24 ENCOUNTER — Ambulatory Visit: Payer: Medicaid Other

## 2022-01-24 DIAGNOSIS — R2689 Other abnormalities of gait and mobility: Secondary | ICD-10-CM | POA: Diagnosis not present

## 2022-01-24 DIAGNOSIS — M79605 Pain in left leg: Secondary | ICD-10-CM

## 2022-01-24 DIAGNOSIS — M6281 Muscle weakness (generalized): Secondary | ICD-10-CM

## 2022-01-24 NOTE — Therapy (Signed)
OUTPATIENT PHYSICAL THERAPY TREATMENT NOTE   Patient Name: Justin [name] MRN: [id number] DOB:09/10/1965, 56 y.o., male Today's Date: 01/24/2022  PCP: [facility] REFERRING PROVIDER: Damita Lack, MD  END OF SESSION:   PT End of Session - 01/24/22 1535     Visit Number 5    Number of Visits 13    Date for PT Re-Evaluation 02/17/22    Authorization Type MCD    Authorization Time Period 10/24-12/4/23    Authorization - Visit Number 4    Authorization - Number of Visits 12    PT Start Time 1532    PT Stop Time 1612    PT Time Calculation (min) 40 min    Activity Tolerance Patient tolerated treatment well    Behavior During Therapy [facility] for tasks assessed/performed               Past Medical History:  Diagnosis Date   Achilles rupture, left    Bipolar 1 disorder ([place])    Dental caries    periodontitis   Pneumonia    Pulmonary embolism ([place]) 05/18/2007   Sleep apnea    wears CPAP   Subdural hematoma ([place]) 01/04/2013   in setting of supratherapeutic INR   Past Surgical History:  Procedure Laterality Date   ACHILLES TENDON SURGERY Left 10/21/2014   Procedure: Left Achilles Reconstruction;  Surgeon: Newt Minion, MD;  Location: [place];  Service: Orthopedics;  Laterality: Left;   APPENDECTOMY     CARDIAC CATHETERIZATION  03/04/2018   [facility]: Normal coronaries, LVEF estimated at 40%, medical Rx   CRANIOTOMY N/A 01/19/2013   Procedure: CRANIOTOMY HEMATOMA EVACUATION SUBDURAL;  Surgeon: Hosie Spangle, MD;  Location: [place] [facility];  Service: Neurosurgery;  Laterality: N/A;   CYSTECTOMY     right head   ELBOW SURGERY     right   FRACTURE SURGERY     finger   I & D EXTREMITY Left 12/07/2016   Procedure: LEFT ACHILLES DEBRIDEMENT;  Surgeon: Newt Minion, MD;  Location: [place];  Service: Orthopedics;  Laterality: Left;   LUMBAR FUSION  12/23/2017   L5 GILL PROCEDURE, RIGHT L5-S1, TRANSFORAMIAL LUMBAR INTERBODY FUSION, BILATERAL LATERAL FUSION,  PEDICLE INSTRUMENTATION   MULTIPLE EXTRACTIONS WITH ALVEOLOPLASTY N/A 03/07/2015   Procedure: MULTIPLE EXTRACTION WITH ALVEOLOPLASTY;  Surgeon: Diona Browner, DDS;  Location: [place];  Service: Oral Surgery;  Laterality: N/A;   SPINAL CORD STIMULATOR INSERTION N/A 05/18/2019   Procedure: LUMBAR SPINAL CORD STIMULATOR INSERTION;  Surgeon: Clydell Hakim, MD;  Location: [place];  Service: Neurosurgery;  Laterality: N/A;  Thoracic/Lumbar   SPINAL CORD STIMULATOR INSERTION N/A 04/06/2020   Procedure: Revision of spinal cord stimulator;  Surgeon: Reece Agar, MD;  Location: [place];  Service: Neurosurgery;  Laterality: N/A;   Patient Active Problem List   Diagnosis Date Noted   Hematoma of left thigh    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   Leukocytosis 12/22/2021   SIRS (systemic inflammatory response syndrome) ([place]) 12/22/2021   Normocytic anemia 12/22/2021   Hematoma 12/09/2021   Acute deep vein thrombosis (DVT) of right peroneal vein ([place]) 10/19/2021   History of pulmonary embolism 10/19/2021   Status post lumbar spinal fusion 01/03/2018   Lumbar stenosis 12/23/2017   Spondylolisthesis, lumbar region 06/25/2017   Achilles tendinitis of left lower extremity 12/07/2016   Achilles tendinitis, left leg 05/17/2016   Obstructive sleep apnea on CPAP 03/07/2015   Post-operative state 03/07/2015   Achilles rupture, left 10/21/2014   OSA (obstructive  sleep apnea) 04/23/2013   Bipolar disorder, unspecified ([place]) 01/07/2013   Sinus tachycardia 01/07/2013   Headache(784.0) 01/07/2013   SDH (subdural hematoma) ([place]) 01/04/2013   H/O PE 01/04/2013   Chronic anticoagulation 01/04/2013   Warfarin-induced coagulopathy ([place]) 01/04/2013   Acute sinusitis 01/04/2013    REFERRING DIAG: Z36.64QI (ICD-10-CM) - Hematoma of left thigh, initial encounter  THERAPY DIAG:  Other abnormalities of gait and mobility  Pain of left lower extremity  Muscle weakness (generalized)  Rationale for Evaluation and Treatment  Rehabilitation  PERTINENT HISTORY:  Bipolar disorder Pulmonary embolism  Subdural hematoma Lumbar fusion  Spinal cord stimulator   PRECAUTIONS: Fall; history of DVT/PE on anti-coags  SUBJECTIVE:                                                                                                                                                                                      SUBJECTIVE STATEMENT:  Patient reports he is sore from the last session, but his leg is feeling a little better.    PAIN:  Are you having pain? Yes: NPRS scale: 5/10 Pain location: Lt thigh;ankle Pain description: tight;burn; tender Aggravating factors: walking Relieving factors: laying down   OBJECTIVE: (objective measures completed at initial evaluation unless otherwise dated)  DIAGNOSTIC FINDINGS:  LLE CT scan: IMPRESSION: 1. No arterial stenosis.  No findings of deep venous thrombosis. 2. Trace calcific plaques in the left internal iliac artery and scattered along the left anterior and posterior tibial arteries. 3. As compared to the study of 12/09/2021, large collection in the vastus lateralis muscle appears more organized and is more complex, with numerous enhancing septations and multiple tiny fluid pockets. The appearance is most suggestive of a pyomyositis although component of hematoma could still be present. No soft tissue gas is seen but there is edema in the anterior compartment deep soft tissue planes which can be seen with fasciitis. 4. Generalized left lower extremity edema. No subcutaneous abscess or soft tissue gas. 5. 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 No overlying cortical subsidence or significant findings of secondary hip DJD. 6. 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 7. Stable 1.7 cm soft tissue nodule in the left ischioanal fossa subcutaneous fat. Nonspecific. Follow-up as indicated. 8. Mildly enlarged lymph nodes in the left external iliac and inguinal chains  probably reactive. 9. Mild prostatomegaly.     PATIENT SURVEYS:  LEFS 27/80   COGNITION:           Overall cognitive status: Within functional limits for tasks assessed  SENSATION: WFL   EDEMA:  Diffuse swelling about LLE; discoloration about distal LLE   MUSCLE LENGTH: Hamstrings: lacking 55 Lt; lacking 20 Rt  01/15/22: Lt lacking 35 degrees    POSTURE: No Significant postural limitations   PALPATION: Diffuse tenderness about left lower leg and quad    LOWER EXTREMITY ROM:   Active ROM Right eval Left eval 01/24/22  Hip flexion       Hip extension       Hip abduction       Hip adduction       Hip internal rotation       Hip external rotation       Knee flexion 128 120   Knee extension WNL WNL   Ankle dorsiflexion 8 0 3  Ankle plantarflexion       Ankle inversion       Ankle eversion        (Blank rows = not tested)   LOWER EXTREMITY MMT:   MMT Right eval Left eval  Hip flexion 5 5  Hip extension      Hip abduction      Hip adduction      Hip internal rotation      Hip external rotation      Knee flexion 5 5  Knee extension 5 4-  Ankle dorsiflexion 5 3-  Ankle plantarflexion      Ankle inversion      Ankle eversion       (Blank rows = not tested)   LOWER EXTREMITY SPECIAL TESTS:  (+) [place]    FUNCTIONAL TESTS:  TUG 14.5 seconds 5 x STS: 20 seconds  SLS: 6 seconds LLE; 9 seconds RLE    GAIT: Distance walked: 25 ft Assistive device utilized: None Level of assistance: Complete Independence Comments: occasional buckling of the Lt knee; foot flat initial contact.        TODAY'S TREATMENT: [facility] Adult PT Treatment:                                                DATE: 01/24/22 Therapeutic Exercise: Recumbent bike level 3 x 5 minutes  Prone quad stretch x 60 sec  Sidelying hip abduction 2 x 10  Mini squat with UE support 2 x 10  Standing march 2 x 10  Calf raise 2 x 15  Updated HEP    [facility] Adult PT Treatment:                                                 DATE: 01/22/22 Therapeutic Exercise: Bike level 2 x 5 minutes  Calf stretch on wedge x 60 seconds  HS stretch with strap x 60 seconds Quad stretch with strap x 60 seconds  Sit to stand 2 x 10  TKE blue band 2 x 10  LAQ 2 x 10; 4 lbs  Standing calf raise 2 x 15  Standing toe raises 2 x 15    [facility] Adult PT Treatment:  DATE: 01/17/22 Therapeutic Exercise: HS stretch with strap x 60 seconds  IT band stretch with strap x 60 seconds  Quad stretch with strap x 60 seconds  Calf stretch on slantboard x 60 seconds  Standing calf raise 2 x 10  SLR 2 x 10  Hip bridge 2 x 10  Sidelying hip abduction 2 x 10  LAQ 2 x 10; 2# Updated HEP          PATIENT EDUCATION:  Education details: HEP Person educated: patient  Education method: Systems developer, cues, handout Education comprehension: returned demo, cues      HOME EXERCISE PROGRAM: Access Code: [email or web address] URL: [email or web address] Date: 01/05/2022 Prepared by: Gwendolyn Grant   Exercises - Seated Long Arc Quad  - 2 x daily - 7 x weekly - 2 sets - 10 reps - Seated Toe Raise  - 2 x daily - 7 x weekly - 2 sets - 10 reps - Long Sitting Calf Stretch with Strap  - 2 x daily - 7 x weekly - 3 sets - 30 sec  hold - Seated Hamstring Stretch  - 2 x daily - 7 x weekly - 3 sets - 30 sec  hold   ASSESSMENT:   CLINICAL IMPRESSION: Patient reports soreness in bilateral thighs since last session. He reports his LLE pain is gradually improving. Able to progress standing strengthening with fairly good tolerance, reporting occasional back pain during session. Lt DF AROM has improved compared to baseline, though remains restricted. HEP updated to include further strengthening.      OBJECTIVE IMPAIRMENTS Abnormal gait, decreased activity tolerance, decreased balance, decreased knowledge of condition, difficulty walking, decreased ROM, decreased strength, impaired  flexibility, improper body mechanics, and pain.    ACTIVITY LIMITATIONS carrying, lifting, bending, standing, squatting, stairs, transfers, and locomotion level   PARTICIPATION LIMITATIONS: meal prep, cleaning, laundry, shopping, community activity, and yard work   PERSONAL FACTORS Age, Time since onset of injury/illness/exacerbation, and 3+ comorbidities: see PMH above   are also affecting patient's functional outcome.    REHAB POTENTIAL: Good   CLINICAL DECISION MAKING: Evolving/moderate complexity   EVALUATION COMPLEXITY: Moderate     GOALS: Goals reviewed with patient? Yes   SHORT TERM GOALS: Target date: 01/26/22 Patient will be independent and compliant with initial HEP.    Baseline: issued at eval  Goal status: met   2.  Patient will demonstrate at least 5 degrees of Lt ankle DF AROM to improve gait mechanics.  Baseline: 0 Goal status: ongoing    3.  Patient will improve Lt hamstring flexibility by at least 15 degrees to reduce stress on the LLE with walking.  Baseline: lacking 55 Goal status: met      LONG TERM GOALS: Target date: 02/17/22   Patient will demonstrate 5/5 Lt knee extensor strength to reduce buckling with walking.  Baseline: see above Goal status: INITIAL   2.  Patient will demonstrate 5/5 Lt ankle DF strength to improve ability to heel strike.  Baseline: see above  Goal status: INITIAL   3.  Patient will score at least 40/80 on the LEFS to signify clinically meaningful improvement in functional abilities.    Baseline: 27/80 Goal status: INITIAL   4.  Patient will complete 5 x STS </= 13 seconds to improve his functional strength and ease of transfers.  Baseline: see above  Goal status: INITIAL   5.  Patient will ambulate community distances without need for AD.  Baseline:  Goal status: INITIAL     PLAN:  PT FREQUENCY: 2x/week   PT DURATION: 6 weeks   PLANNED INTERVENTIONS: Therapeutic exercises, Therapeutic activity, Neuromuscular  re-education, Balance training, Gait training, Patient/Family education, Self Care, Manual therapy, and Re-evaluation   PLAN FOR NEXT SESSION: LLE stretching and strengthening; review and update HEP prn; balance training    Gwendolyn Grant, PT, DPT, ATC 01/24/22 4:12 PM

## 2022-01-26 ENCOUNTER — Emergency Department (HOSPITAL_COMMUNITY)
Admission: EM | Admit: 2022-01-26 | Discharge: 2022-01-27 | Discharge status: Against Medical Advice | Payer: Medicaid Other | Attending: Emergency Medicine | Admitting: Emergency Medicine

## 2022-01-26 ENCOUNTER — Other Ambulatory Visit: Payer: Self-pay

## 2022-01-26 ENCOUNTER — Encounter (HOSPITAL_COMMUNITY): Payer: Self-pay | Admitting: Emergency Medicine

## 2022-01-26 DIAGNOSIS — R0789 Other chest pain: Secondary | ICD-10-CM | POA: Insufficient documentation

## 2022-01-26 DIAGNOSIS — R5383 Other fatigue: Secondary | ICD-10-CM | POA: Insufficient documentation

## 2022-01-26 DIAGNOSIS — M549 Dorsalgia, unspecified: Secondary | ICD-10-CM | POA: Insufficient documentation

## 2022-01-26 DIAGNOSIS — Z5321 Procedure and treatment not carried out due to patient leaving prior to being seen by health care provider: Secondary | ICD-10-CM | POA: Diagnosis not present

## 2022-01-26 DIAGNOSIS — R0602 Shortness of breath: Secondary | ICD-10-CM | POA: Diagnosis not present

## 2022-01-26 LAB — BASIC METABOLIC PANEL
Anion gap: 8 (ref 5–15)
BUN: 5 mg/dL — ABNORMAL LOW (ref 6–20)
CO2: 26 mmol/L (ref 22–32)
Calcium: 9.6 mg/dL (ref 8.9–10.3)
Chloride: 110 mmol/L (ref 98–111)
Creatinine, Ser: 1.31 mg/dL — ABNORMAL HIGH (ref 0.61–1.24)
GFR, Estimated: >60 mL/min (ref >60)
Glucose, Bld: 99 mg/dL (ref 70–99)
Potassium: 3.8 mmol/L (ref 3.5–5.1)
Sodium: 144 mmol/L (ref 135–145)

## 2022-01-26 LAB — CBC
HCT: 39.5 % (ref 39.0–52.0)
Hemoglobin: 12.6 g/dL — ABNORMAL LOW (ref 13.0–17.0)
MCH: 28.6 pg (ref 26.0–34.0)
MCHC: 31.9 g/dL (ref 30.0–36.0)
MCV: 89.6 fL (ref 80.0–100.0)
Platelets: 275 10*3/uL (ref 150–400)
RBC: 4.41 MIL/uL (ref 4.22–5.81)
RDW: 15.5 % (ref 11.5–15.5)
WBC: 10.5 10*3/uL (ref 4.0–10.5)
nRBC: 0 % (ref 0.0–0.2)

## 2022-01-26 LAB — TROPONIN I (HIGH SENSITIVITY): Troponin I (High Sensitivity): 26 ng/L — ABNORMAL HIGH (ref <18)

## 2022-01-26 NOTE — ED Triage Notes (Signed)
Patient reports intermittent mid chest pain onset yesterday with mild SOB , no emesis or diaphoresis .

## 2022-01-26 NOTE — ED Notes (Signed)
Pt stated, "I am leaving". Pt advised to stay. Pt stated, "I will be back in the morning"

## 2022-01-26 NOTE — ED Provider Triage Note (Signed)
Emergency Medicine Provider Triage Evaluation Note  Justin [name] , a 56 y.o. male  was evaluated in triage.  Pt complains of right sided chest pain, back pain, and SOB for several weeks, worsening over past several days. Hx of PE, on pradaxa. No missed doses. Seeing some blood when he blows his nose. Pain worse with deep breathing.   Review of Systems  Positive: Fatigue, CP, SOB, back pain Negative: Abd pain, N/V/D, constipation, fever, chills  Physical Exam  BP (!) 139/116   Pulse (!) 103   Temp 98.8 F (37.1 C) (Oral)   Resp 16   SpO2 94%  Gen:   Awake, no distress   Resp:  Normal effort  MSK:   Moves extremities without difficulty  Other:    Medical Decision Making  Medically screening exam initiated at 9:18 PM.  Appropriate orders placed.  Justin [name] was informed that the remainder of the evaluation will be completed by another provider, this initial triage assessment does not replace that evaluation, and the importance of remaining in the ED until their evaluation is complete.  Workup initiated. Pt inquired about wait time, explained that it may take some time to get his work up started and be roomed. Patient said he may leave and come back tomorrow morning as he is very tired. Explained if he does so he needs to notify staff prior to leaving.    Justin [name], Justin [name] 01/26/22 2119

## 2022-01-29 ENCOUNTER — Ambulatory Visit: Payer: Medicaid Other

## 2022-01-29 DIAGNOSIS — R2689 Other abnormalities of gait and mobility: Secondary | ICD-10-CM

## 2022-01-29 DIAGNOSIS — M79605 Pain in left leg: Secondary | ICD-10-CM

## 2022-01-29 DIAGNOSIS — M6281 Muscle weakness (generalized): Secondary | ICD-10-CM

## 2022-01-29 NOTE — Therapy (Signed)
OUTPATIENT PHYSICAL THERAPY TREATMENT NOTE   Patient Name: Justin [name] MRN: [id number] DOB:1965-07-30, 56 y.o., male Today's Date: 01/29/2022  PCP: [facility] REFERRING PROVIDER: Damita Lack, MD  END OF SESSION:   PT End of Session - 01/29/22 1454     Visit Number 6    Number of Visits 13    Date for PT Re-Evaluation 02/17/22    Authorization Type MCD    Authorization Time Period 10/24-12/4/23    Authorization - Visit Number 5    Authorization - Number of Visits 12    PT Start Time 8563    PT Stop Time 1539    PT Time Calculation (min) 45 min    Activity Tolerance Patient tolerated treatment well    Behavior During Therapy [facility] for tasks assessed/performed                Past Medical History:  Diagnosis Date   Achilles rupture, left    Bipolar 1 disorder ([place])    Dental caries    periodontitis   Pneumonia    Pulmonary embolism ([place]) 05/18/2007   Sleep apnea    wears CPAP   Subdural hematoma ([place]) 01/04/2013   in setting of supratherapeutic INR   Past Surgical History:  Procedure Laterality Date   ACHILLES TENDON SURGERY Left 10/21/2014   Procedure: Left Achilles Reconstruction;  Surgeon: Newt Minion, MD;  Location: [place];  Service: Orthopedics;  Laterality: Left;   APPENDECTOMY     CARDIAC CATHETERIZATION  03/04/2018   [facility]: Normal coronaries, LVEF estimated at 40%, medical Rx   CRANIOTOMY N/A 01/19/2013   Procedure: CRANIOTOMY HEMATOMA EVACUATION SUBDURAL;  Surgeon: Hosie Spangle, MD;  Location: [place] [facility];  Service: Neurosurgery;  Laterality: N/A;   CYSTECTOMY     right head   ELBOW SURGERY     right   FRACTURE SURGERY     finger   I & D EXTREMITY Left 12/07/2016   Procedure: LEFT ACHILLES DEBRIDEMENT;  Surgeon: Newt Minion, MD;  Location: [place];  Service: Orthopedics;  Laterality: Left;   LUMBAR FUSION  12/23/2017   L5 GILL PROCEDURE, RIGHT L5-S1, TRANSFORAMIAL LUMBAR INTERBODY FUSION, BILATERAL LATERAL FUSION,  PEDICLE INSTRUMENTATION   MULTIPLE EXTRACTIONS WITH ALVEOLOPLASTY N/A 03/07/2015   Procedure: MULTIPLE EXTRACTION WITH ALVEOLOPLASTY;  Surgeon: Diona Browner, DDS;  Location: [place];  Service: Oral Surgery;  Laterality: N/A;   SPINAL CORD STIMULATOR INSERTION N/A 05/18/2019   Procedure: LUMBAR SPINAL CORD STIMULATOR INSERTION;  Surgeon: Clydell Hakim, MD;  Location: [place];  Service: Neurosurgery;  Laterality: N/A;  Thoracic/Lumbar   SPINAL CORD STIMULATOR INSERTION N/A 04/06/2020   Procedure: Revision of spinal cord stimulator;  Surgeon: Reece Agar, MD;  Location: [place];  Service: Neurosurgery;  Laterality: N/A;   Patient Active Problem List   Diagnosis Date Noted   Hematoma of left thigh    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   Leukocytosis 12/22/2021   SIRS (systemic inflammatory response syndrome) ([place]) 12/22/2021   Normocytic anemia 12/22/2021   Hematoma 12/09/2021   Acute deep vein thrombosis (DVT) of right peroneal vein ([place]) 10/19/2021   History of pulmonary embolism 10/19/2021   Status post lumbar spinal fusion 01/03/2018   Lumbar stenosis 12/23/2017   Spondylolisthesis, lumbar region 06/25/2017   Achilles tendinitis of left lower extremity 12/07/2016   Achilles tendinitis, left leg 05/17/2016   Obstructive sleep apnea on CPAP 03/07/2015   Post-operative state 03/07/2015   Achilles rupture, left 10/21/2014   OSA (  obstructive sleep apnea) 04/23/2013   Bipolar disorder, unspecified ([place]) 01/07/2013   Sinus tachycardia 01/07/2013   Headache(784.0) 01/07/2013   SDH (subdural hematoma) ([place]) 01/04/2013   H/O PE 01/04/2013   Chronic anticoagulation 01/04/2013   Warfarin-induced coagulopathy ([place]) 01/04/2013   Acute sinusitis 01/04/2013    REFERRING DIAG: B86.75QG (ICD-10-CM) - Hematoma of left thigh, initial encounter  THERAPY DIAG:  Other abnormalities of gait and mobility  Pain of left lower extremity  Muscle weakness (generalized)  Rationale for Evaluation and Treatment  Rehabilitation  PERTINENT HISTORY:  Bipolar disorder Pulmonary embolism  Subdural hematoma Lumbar fusion  Spinal cord stimulator   PRECAUTIONS: Fall; history of DVT/PE on anti-coags  SUBJECTIVE:                                                                                                                                                                                      SUBJECTIVE STATEMENT:  Patient reports his leg is feeling a little better. He reached out to his PCP and was instructed that he could use compression socks for his swelling and has been using them off/on. He has f/u for his chronic back pain with specialist on 02/07/22.    PAIN:  Are you having pain? Yes: NPRS scale: 6/10 Pain location: Lt thigh;ankle Pain description: tender Aggravating factors: walking Relieving factors: laying down   OBJECTIVE: (objective measures completed at initial evaluation unless otherwise dated)  DIAGNOSTIC FINDINGS:  LLE CT scan: IMPRESSION: 1. No arterial stenosis.  No findings of deep venous thrombosis. 2. Trace calcific plaques in the left internal iliac artery and scattered along the left anterior and posterior tibial arteries. 3. As compared to the study of 12/09/2021, large collection in the vastus lateralis muscle appears more organized and is more complex, with numerous enhancing septations and multiple tiny fluid pockets. The appearance is most suggestive of a pyomyositis although component of hematoma could still be present. No soft tissue gas is seen but there is edema in the anterior compartment deep soft tissue planes which can be seen with fasciitis. 4. Generalized left lower extremity edema. No subcutaneous abscess or soft tissue gas. 5. 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 No overlying cortical subsidence or significant findings of secondary hip DJD. 6. 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 7. Stable 1.7 cm soft tissue nodule in the left ischioanal  fossa subcutaneous fat. Nonspecific. Follow-up as indicated. 8. Mildly enlarged lymph nodes in the left external iliac and inguinal chains probably reactive. 9. Mild prostatomegaly.     PATIENT SURVEYS:  LEFS 27/80   COGNITION:           Overall cognitive status: Within functional  limits for tasks assessed                          SENSATION: WFL   EDEMA:  Diffuse swelling about LLE; discoloration about distal LLE   MUSCLE LENGTH: Hamstrings: lacking 55 Lt; lacking 20 Rt  01/15/22: Lt lacking 35 degrees    POSTURE: No Significant postural limitations   PALPATION: Diffuse tenderness about left lower leg and quad    LOWER EXTREMITY ROM:   Active ROM Right eval Left eval 01/24/22  Hip flexion       Hip extension       Hip abduction       Hip adduction       Hip internal rotation       Hip external rotation       Knee flexion 128 120   Knee extension WNL WNL   Ankle dorsiflexion 8 0 3  Ankle plantarflexion       Ankle inversion       Ankle eversion        (Blank rows = not tested)   LOWER EXTREMITY MMT:   MMT Right eval Left eval  Hip flexion 5 5  Hip extension      Hip abduction      Hip adduction      Hip internal rotation      Hip external rotation      Knee flexion 5 5  Knee extension 5 4-  Ankle dorsiflexion 5 3-  Ankle plantarflexion      Ankle inversion      Ankle eversion       (Blank rows = not tested)   LOWER EXTREMITY SPECIAL TESTS:  (+) [place]    FUNCTIONAL TESTS:  TUG 14.5 seconds 5 x STS: 20 seconds  SLS: 6 seconds LLE; 9 seconds RLE    GAIT: Distance walked: 25 ft Assistive device utilized: None Level of assistance: Complete Independence Comments: occasional buckling of the Lt knee; foot flat initial contact.        TODAY'S TREATMENT:  [facility] Adult PT Treatment:                                                DATE: 01/29/22 Therapeutic Exercise: Recumbent bike level 3 x 5 minutes  Prone quad stretch x 60 seconds  Hamstring  stretch with strap x 60 seconds  Resisted knee extension 3 x 10@ 20 lbs  Resisted hamstring curl 3 x 10 @ 35 lbs  Leg press 3 x 10 @ 80 lbs  SLS 2 x 20 sec each  TKE black band 2 x 10   [facility] Adult PT Treatment:                                                DATE: 01/24/22 Therapeutic Exercise: Recumbent bike level 3 x 5 minutes  Prone quad stretch x 60 sec  Sidelying hip abduction 2 x 10  Mini squat with UE support 2 x 10  Standing march 2 x 10  Calf raise 2 x 15  Updated HEP    [facility] Adult PT Treatment:  DATE: 01/22/22 Therapeutic Exercise: Bike level 2 x 5 minutes  Calf stretch on wedge x 60 seconds  HS stretch with strap x 60 seconds Quad stretch with strap x 60 seconds  Sit to stand 2 x 10  TKE blue band 2 x 10  LAQ 2 x 10; 4 lbs  Standing calf raise 2 x 15  Standing toe raises 2 x 15      PATIENT EDUCATION:  Education details: HEP Person educated: patient  Education method: Systems developer, cues, handout Education comprehension: returned demo, cues      HOME EXERCISE PROGRAM: Access Code: [email or web address] URL: [email or web address] Date: 01/05/2022 Prepared by: Gwendolyn Grant   Exercises - Seated Long Arc Quad  - 2 x daily - 7 x weekly - 2 sets - 10 reps - Seated Toe Raise  - 2 x daily - 7 x weekly - 2 sets - 10 reps - Long Sitting Calf Stretch with Strap  - 2 x daily - 7 x weekly - 3 sets - 30 sec  hold - Seated Hamstring Stretch  - 2 x daily - 7 x weekly - 3 sets - 30 sec  hold   ASSESSMENT:   CLINICAL IMPRESSION: Patient tolerated session well today focusing on progression of LE strengthening. Introduced Patent examiner today with patient demonstrating good form and control with all exercises. He demonstrates fair SL balance bilaterally requiring frequent use of reaching strategy to maintain SLS. No reports of increased LLE pain throughout session.      OBJECTIVE IMPAIRMENTS Abnormal gait, decreased activity  tolerance, decreased balance, decreased knowledge of condition, difficulty walking, decreased ROM, decreased strength, impaired flexibility, improper body mechanics, and pain.    ACTIVITY LIMITATIONS carrying, lifting, bending, standing, squatting, stairs, transfers, and locomotion level   PARTICIPATION LIMITATIONS: meal prep, cleaning, laundry, shopping, community activity, and yard work   PERSONAL FACTORS Age, Time since onset of injury/illness/exacerbation, and 3+ comorbidities: see PMH above   are also affecting patient's functional outcome.    REHAB POTENTIAL: Good   CLINICAL DECISION MAKING: Evolving/moderate complexity   EVALUATION COMPLEXITY: Moderate     GOALS: Goals reviewed with patient? Yes   SHORT TERM GOALS: Target date: 01/26/22 Patient will be independent and compliant with initial HEP.    Baseline: issued at eval  Goal status: met   2.  Patient will demonstrate at least 5 degrees of Lt ankle DF AROM to improve gait mechanics.  Baseline: 0 Goal status: ongoing    3.  Patient will improve Lt hamstring flexibility by at least 15 degrees to reduce stress on the LLE with walking.  Baseline: lacking 55 Goal status: met      LONG TERM GOALS: Target date: 02/17/22   Patient will demonstrate 5/5 Lt knee extensor strength to reduce buckling with walking.  Baseline: see above Goal status: INITIAL   2.  Patient will demonstrate 5/5 Lt ankle DF strength to improve ability to heel strike.  Baseline: see above  Goal status: INITIAL   3.  Patient will score at least 40/80 on the LEFS to signify clinically meaningful improvement in functional abilities.    Baseline: 27/80 Goal status: INITIAL   4.  Patient will complete 5 x STS </= 13 seconds to improve his functional strength and ease of transfers.  Baseline: see above  Goal status: INITIAL   5.  Patient will ambulate community distances without need for AD.  Baseline:  Goal status: INITIAL     PLAN: PT  FREQUENCY: 2x/week  PT DURATION: 6 weeks   PLANNED INTERVENTIONS: Therapeutic exercises, Therapeutic activity, Neuromuscular re-education, Balance training, Gait training, Patient/Family education, Self Care, Manual therapy, and Re-evaluation   PLAN FOR NEXT SESSION: LLE stretching and strengthening; review and update HEP prn; balance training    Gwendolyn Grant, PT, DPT, ATC 01/29/22 3:41 PM

## 2022-01-31 ENCOUNTER — Ambulatory Visit: Payer: Medicaid Other

## 2022-01-31 NOTE — Therapy (Incomplete)
OUTPATIENT PHYSICAL THERAPY TREATMENT NOTE   Patient Name: Justin [name] MRN: [id number] DOB:24-Jan-1966, 56 y.o., male Today's Date: 01/31/2022  PCP: [facility] REFERRING PROVIDER: Damita Lack, MD  END OF SESSION:        Past Medical History:  Diagnosis Date   Achilles rupture, left    Bipolar 1 disorder ([place])    Dental caries    periodontitis   Pneumonia    Pulmonary embolism ([place]) 05/18/2007   Sleep apnea    wears CPAP   Subdural hematoma ([place]) 01/04/2013   in setting of supratherapeutic INR   Past Surgical History:  Procedure Laterality Date   ACHILLES TENDON SURGERY Left 10/21/2014   Procedure: Left Achilles Reconstruction;  Surgeon: Newt Minion, MD;  Location: [place];  Service: Orthopedics;  Laterality: Left;   APPENDECTOMY     CARDIAC CATHETERIZATION  03/04/2018   [facility]: Normal coronaries, LVEF estimated at 40%, medical Rx   CRANIOTOMY N/A 01/19/2013   Procedure: CRANIOTOMY HEMATOMA EVACUATION SUBDURAL;  Surgeon: Hosie Spangle, MD;  Location: [place] [facility];  Service: Neurosurgery;  Laterality: N/A;   CYSTECTOMY     right head   ELBOW SURGERY     right   FRACTURE SURGERY     finger   I & D EXTREMITY Left 12/07/2016   Procedure: LEFT ACHILLES DEBRIDEMENT;  Surgeon: Newt Minion, MD;  Location: [place];  Service: Orthopedics;  Laterality: Left;   LUMBAR FUSION  12/23/2017   L5 GILL PROCEDURE, RIGHT L5-S1, TRANSFORAMIAL LUMBAR INTERBODY FUSION, BILATERAL LATERAL FUSION, PEDICLE INSTRUMENTATION   MULTIPLE EXTRACTIONS WITH ALVEOLOPLASTY N/A 03/07/2015   Procedure: MULTIPLE EXTRACTION WITH ALVEOLOPLASTY;  Surgeon: Diona Browner, DDS;  Location: [place];  Service: Oral Surgery;  Laterality: N/A;   SPINAL CORD STIMULATOR INSERTION N/A 05/18/2019   Procedure: LUMBAR SPINAL CORD STIMULATOR INSERTION;  Surgeon: Clydell Hakim, MD;  Location: [place];  Service: Neurosurgery;  Laterality: N/A;  Thoracic/Lumbar   SPINAL CORD STIMULATOR INSERTION N/A  04/06/2020   Procedure: Revision of spinal cord stimulator;  Surgeon: Reece Agar, MD;  Location: [place];  Service: Neurosurgery;  Laterality: N/A;   Patient Active Problem List   Diagnosis Date Noted   Hematoma of left thigh    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   Leukocytosis 12/22/2021   SIRS (systemic inflammatory response syndrome) ([place]) 12/22/2021   Normocytic anemia 12/22/2021   Hematoma 12/09/2021   Acute deep vein thrombosis (DVT) of right peroneal vein ([place]) 10/19/2021   History of pulmonary embolism 10/19/2021   Status post lumbar spinal fusion 01/03/2018   Lumbar stenosis 12/23/2017   Spondylolisthesis, lumbar region 06/25/2017   Achilles tendinitis of left lower extremity 12/07/2016   Achilles tendinitis, left leg 05/17/2016   Obstructive sleep apnea on CPAP 03/07/2015   Post-operative state 03/07/2015   Achilles rupture, left 10/21/2014   OSA (obstructive sleep apnea) 04/23/2013   Bipolar disorder, unspecified ([place]) 01/07/2013   Sinus tachycardia 01/07/2013   Headache(784.0) 01/07/2013   SDH (subdural hematoma) ([place]) 01/04/2013   H/O PE 01/04/2013   Chronic anticoagulation 01/04/2013   Warfarin-induced coagulopathy ([place]) 01/04/2013   Acute sinusitis 01/04/2013    REFERRING DIAG: P50.93OI (ICD-10-CM) - Hematoma of left thigh, initial encounter  THERAPY DIAG:  No diagnosis found.  Rationale for Evaluation and Treatment Rehabilitation  PERTINENT HISTORY:  Bipolar disorder Pulmonary embolism  Subdural hematoma Lumbar fusion  Spinal cord stimulator   PRECAUTIONS: Fall; history of DVT/PE on anti-coags  SUBJECTIVE:  SUBJECTIVE STATEMENT:  Patient reports his leg is feeling a little better. He reached out to his PCP and was instructed that he could use compression socks for his swelling and has  been using them off/on. He has f/u for his chronic back pain with specialist on 02/07/22.    PAIN:  Are you having pain? Yes: NPRS scale: 6/10 Pain location: Lt thigh;ankle Pain description: tender Aggravating factors: walking Relieving factors: laying down   OBJECTIVE: (objective measures completed at initial evaluation unless otherwise dated)  DIAGNOSTIC FINDINGS:  LLE CT scan: IMPRESSION: 1. No arterial stenosis.  No findings of deep venous thrombosis. 2. Trace calcific plaques in the left internal iliac artery and scattered along the left anterior and posterior tibial arteries. 3. As compared to the study of 12/09/2021, large collection in the vastus lateralis muscle appears more organized and is more complex, with numerous enhancing septations and multiple tiny fluid pockets. The appearance is most suggestive of a pyomyositis although component of hematoma could still be present. No soft tissue gas is seen but there is edema in the anterior compartment deep soft tissue planes which can be seen with fasciitis. 4. Generalized left lower extremity edema. No subcutaneous abscess or soft tissue gas. 5. 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 No overlying cortical subsidence or significant findings of secondary hip DJD. 6. 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 7. Stable 1.7 cm soft tissue nodule in the left ischioanal fossa subcutaneous fat. Nonspecific. Follow-up as indicated. 8. Mildly enlarged lymph nodes in the left external iliac and inguinal chains probably reactive. 9. Mild prostatomegaly.     PATIENT SURVEYS:  LEFS 27/80   COGNITION:           Overall cognitive status: Within functional limits for tasks assessed                          SENSATION: WFL   EDEMA:  Diffuse swelling about LLE; discoloration about distal LLE   MUSCLE LENGTH: Hamstrings: lacking 55 Lt; lacking 20 Rt  01/15/22: Lt lacking 35 degrees    POSTURE: No Significant postural  limitations   PALPATION: Diffuse tenderness about left lower leg and quad    LOWER EXTREMITY ROM:   Active ROM Right eval Left eval 01/24/22  Hip flexion       Hip extension       Hip abduction       Hip adduction       Hip internal rotation       Hip external rotation       Knee flexion 128 120   Knee extension WNL WNL   Ankle dorsiflexion 8 0 3  Ankle plantarflexion       Ankle inversion       Ankle eversion        (Blank rows = not tested)   LOWER EXTREMITY MMT:   MMT Right eval Left eval  Hip flexion 5 5  Hip extension      Hip abduction      Hip adduction      Hip internal rotation      Hip external rotation      Knee flexion 5 5  Knee extension 5 4-  Ankle dorsiflexion 5 3-  Ankle plantarflexion      Ankle inversion      Ankle eversion       (Blank rows = not tested)   LOWER EXTREMITY SPECIAL TESTS:  (+)  [place]    FUNCTIONAL TESTS:  TUG 14.5 seconds 5 x STS: 20 seconds  SLS: 6 seconds LLE; 9 seconds RLE    GAIT: Distance walked: 25 ft Assistive device utilized: None Level of assistance: Complete Independence Comments: occasional buckling of the Lt knee; foot flat initial contact.        TODAY'S TREATMENT:  [facility] Adult PT Treatment:                                                DATE: 01/29/22 Therapeutic Exercise: Recumbent bike level 3 x 5 minutes  Prone quad stretch x 60 seconds  Hamstring stretch with strap x 60 seconds  Resisted knee extension 3 x 10@ 20 lbs  Resisted hamstring curl 3 x 10 @ 35 lbs  Leg press 3 x 10 @ 80 lbs  SLS 2 x 20 sec each  TKE black band 2 x 10   [facility] Adult PT Treatment:                                                DATE: 01/24/22 Therapeutic Exercise: Recumbent bike level 3 x 5 minutes  Prone quad stretch x 60 sec  Sidelying hip abduction 2 x 10  Mini squat with UE support 2 x 10  Standing march 2 x 10  Calf raise 2 x 15  Updated HEP    [facility] Adult PT Treatment:                                                 DATE: 01/22/22 Therapeutic Exercise: Bike level 2 x 5 minutes  Calf stretch on wedge x 60 seconds  HS stretch with strap x 60 seconds Quad stretch with strap x 60 seconds  Sit to stand 2 x 10  TKE blue band 2 x 10  LAQ 2 x 10; 4 lbs  Standing calf raise 2 x 15  Standing toe raises 2 x 15      PATIENT EDUCATION:  Education details: HEP Person educated: patient  Education method: Systems developer, cues, handout Education comprehension: returned demo, cues      HOME EXERCISE PROGRAM: Access Code: [email or web address] URL: [email or web address] Date: 01/05/2022 Prepared by: Gwendolyn Grant   Exercises - Seated Long Arc Quad  - 2 x daily - 7 x weekly - 2 sets - 10 reps - Seated Toe Raise  - 2 x daily - 7 x weekly - 2 sets - 10 reps - Long Sitting Calf Stretch with Strap  - 2 x daily - 7 x weekly - 3 sets - 30 sec  hold - Seated Hamstring Stretch  - 2 x daily - 7 x weekly - 3 sets - 30 sec  hold   ASSESSMENT:   CLINICAL IMPRESSION: Patient tolerated session well today focusing on progression of LE strengthening. Introduced Patent examiner today with patient demonstrating good form and control with all exercises. He demonstrates fair SL balance bilaterally requiring frequent use of reaching strategy to maintain SLS. No reports of increased LLE pain throughout session.      OBJECTIVE IMPAIRMENTS Abnormal gait,  decreased activity tolerance, decreased balance, decreased knowledge of condition, difficulty walking, decreased ROM, decreased strength, impaired flexibility, improper body mechanics, and pain.    ACTIVITY LIMITATIONS carrying, lifting, bending, standing, squatting, stairs, transfers, and locomotion level   PARTICIPATION LIMITATIONS: meal prep, cleaning, laundry, shopping, community activity, and yard work   PERSONAL FACTORS Age, Time since onset of injury/illness/exacerbation, and 3+ comorbidities: see PMH above   are also affecting patient's functional outcome.    REHAB  POTENTIAL: Good   CLINICAL DECISION MAKING: Evolving/moderate complexity   EVALUATION COMPLEXITY: Moderate     GOALS: Goals reviewed with patient? Yes   SHORT TERM GOALS: Target date: 01/26/22 Patient will be independent and compliant with initial HEP.    Baseline: issued at eval  Goal status: met   2.  Patient will demonstrate at least 5 degrees of Lt ankle DF AROM to improve gait mechanics.  Baseline: 0 Goal status: ongoing    3.  Patient will improve Lt hamstring flexibility by at least 15 degrees to reduce stress on the LLE with walking.  Baseline: lacking 55 Goal status: met      LONG TERM GOALS: Target date: 02/17/22   Patient will demonstrate 5/5 Lt knee extensor strength to reduce buckling with walking.  Baseline: see above Goal status: INITIAL   2.  Patient will demonstrate 5/5 Lt ankle DF strength to improve ability to heel strike.  Baseline: see above  Goal status: INITIAL   3.  Patient will score at least 40/80 on the LEFS to signify clinically meaningful improvement in functional abilities.    Baseline: 27/80 Goal status: INITIAL   4.  Patient will complete 5 x STS </= 13 seconds to improve his functional strength and ease of transfers.  Baseline: see above  Goal status: INITIAL   5.  Patient will ambulate community distances without need for AD.  Baseline:  Goal status: INITIAL     PLAN: PT FREQUENCY: 2x/week   PT DURATION: 6 weeks   PLANNED INTERVENTIONS: Therapeutic exercises, Therapeutic activity, Neuromuscular re-education, Balance training, Gait training, Patient/Family education, Self Care, Manual therapy, and Re-evaluation   PLAN FOR NEXT SESSION: LLE stretching and strengthening; review and update HEP prn; balance training    Gwendolyn Grant, PT, DPT, ATC 01/31/22 7:58 AM

## 2022-02-01 ENCOUNTER — Telehealth: Payer: Self-pay

## 2022-02-01 NOTE — Telephone Encounter (Signed)
Called patient to follow-up after he cancelled yesterday's visit stating he was going to the ED. He did not go to the ED for his complaint of chest pain. Was instructed to urgently seek medical care for his chest pain and that PT will be placed on hold until he seeks medical care with patient verbalizing understanding.  [name], PT, DPT, ATC 02/01/22 2:19 PM

## 2022-02-03 ENCOUNTER — Emergency Department (HOSPITAL_BASED_OUTPATIENT_CLINIC_OR_DEPARTMENT_OTHER)
Admission: EM | Admit: 2022-02-03 | Discharge: 2022-02-03 | Disposition: A | Discharge status: Home / Self Care | Payer: Medicaid Other | Attending: Emergency Medicine | Admitting: Emergency Medicine

## 2022-02-03 ENCOUNTER — Other Ambulatory Visit: Payer: Self-pay

## 2022-02-03 ENCOUNTER — Encounter (HOSPITAL_BASED_OUTPATIENT_CLINIC_OR_DEPARTMENT_OTHER): Payer: Self-pay | Admitting: Emergency Medicine

## 2022-02-03 ENCOUNTER — Emergency Department (HOSPITAL_BASED_OUTPATIENT_CLINIC_OR_DEPARTMENT_OTHER): Payer: Medicaid Other | Admitting: Radiology

## 2022-02-03 DIAGNOSIS — Z7901 Long term (current) use of anticoagulants: Secondary | ICD-10-CM | POA: Diagnosis not present

## 2022-02-03 DIAGNOSIS — R778 Other specified abnormalities of plasma proteins: Secondary | ICD-10-CM | POA: Insufficient documentation

## 2022-02-03 DIAGNOSIS — I10 Essential (primary) hypertension: Secondary | ICD-10-CM | POA: Insufficient documentation

## 2022-02-03 DIAGNOSIS — R079 Chest pain, unspecified: Secondary | ICD-10-CM

## 2022-02-03 DIAGNOSIS — R0789 Other chest pain: Secondary | ICD-10-CM | POA: Diagnosis present

## 2022-02-03 DIAGNOSIS — R000 Tachycardia, unspecified: Secondary | ICD-10-CM | POA: Diagnosis not present

## 2022-02-03 DIAGNOSIS — Z79899 Other long term (current) drug therapy: Secondary | ICD-10-CM | POA: Diagnosis not present

## 2022-02-03 LAB — TROPONIN I (HIGH SENSITIVITY)
Troponin I (High Sensitivity): 16 ng/L
Troponin I (High Sensitivity): 17 ng/L

## 2022-02-03 LAB — BASIC METABOLIC PANEL
Anion gap: 9 (ref 5–15)
BUN: 5 mg/dL — ABNORMAL LOW (ref 6–20)
CO2: 24 mmol/L (ref 22–32)
Calcium: 8.8 mg/dL — ABNORMAL LOW (ref 8.9–10.3)
Chloride: 109 mmol/L (ref 98–111)
Creatinine, Ser: 1.17 mg/dL (ref 0.61–1.24)
GFR, Estimated: >60 mL/min (ref >60)
Glucose, Bld: 112 mg/dL — ABNORMAL HIGH (ref 70–99)
Potassium: 3.5 mmol/L (ref 3.5–5.1)
Sodium: 142 mmol/L (ref 135–145)

## 2022-02-03 LAB — CBC
HCT: 41 % (ref 39.0–52.0)
Hemoglobin: 13.3 g/dL (ref 13.0–17.0)
MCH: 28.6 pg (ref 26.0–34.0)
MCHC: 32.4 g/dL (ref 30.0–36.0)
MCV: 88.2 fL (ref 80.0–100.0)
Platelets: 255 10*3/uL (ref 150–400)
RBC: 4.65 MIL/uL (ref 4.22–5.81)
RDW: 15.1 % (ref 11.5–15.5)
WBC: 10 10*3/uL (ref 4.0–10.5)
nRBC: 0 % (ref 0.0–0.2)

## 2022-02-03 LAB — D-DIMER, QUANTITATIVE: D-Dimer, Quant: 0.4 ug{FEU}/mL (ref 0.00–0.50)

## 2022-02-03 NOTE — ED Triage Notes (Signed)
Pt is on pradaxa for hx PE.

## 2022-02-03 NOTE — ED Triage Notes (Signed)
Chest pain for about a week, center of chest. Pt went to ED on 11/10 had blood work but left due to long wait time. Saw his results on mychart that said he had slight heart attack, so returns today

## 2022-02-03 NOTE — ED Provider Notes (Signed)
[facility] EMERGENCY DEPT Provider Note   CSN: [id number] Arrival date & time: 02/03/22  1450     History  Chief Complaint  Patient presents with   Chest Pain    Justin [name] is a 56 y.o. male.   Chest Pain Patient presents with chest pain.  Anterior chest.  Has had for about a week.  Anterior chest and had been to outside hospital but not been treated.  Troponin mildly elevated at that time.  Pain comes and goes.  Anterior chest.  Somewhat worse with breathing.  History of DVTs.  Is on anticoagulation with Pradaxa.  Has had myositis of left leg is at physical therapy for it has been able to do the physical therapy.  Not exertional pain but states he does get it with deep breaths.    Past Medical History:  Diagnosis Date   Achilles rupture, left    Bipolar 1 disorder (HCC)    Dental caries    periodontitis   Pneumonia    Pulmonary embolism (HCC) 05/18/2007   Sleep apnea    wears CPAP   Subdural hematoma (HCC) 01/04/2013   in setting of supratherapeutic INR    Home Medications Prior to Admission medications   Medication Sig Start Date End Date Taking? Authorizing Provider  dabigatran (PRADAXA) 150 MG CAPS capsule Take 1 capsule (150 mg total) by mouth 2 (two) times daily. 01/19/22   Briant Cedar, PA-C  diclofenac Sodium (VOLTAREN) 1 % GEL Apply 4 g topically 4 (four) times daily as needed (pain). 12/08/21   [provider]  doxepin (SINEQUAN) 75 MG capsule Take 75 mg by mouth at bedtime. 12/14/19   [provider]  enoxaparin (LOVENOX) 100 MG/ML injection Inject 1 mL (100 mg total) into the skin every 12 (twelve) hours for 14 days. 01/19/22 02/02/22  Georga Kaufmann T, PA-C  gabapentin (NEURONTIN) 300 MG capsule Take 1 capsule (300 mg total) by mouth 3 (three) times daily for 14 days. 12/03/21 12/22/21  Redwine, Madison A, PA-C  HYDROcodone-acetaminophen (NORCO) 7.5-325 MG tablet Take 0.5-1 tablets by mouth every 8 (eight) hours as needed for  moderate pain or severe pain. 12/25/21   Amin, Loura Halt, MD  lithium 600 MG capsule Take 600 mg by mouth at bedtime.    [provider]  mirtazapine (REMERON) 45 MG tablet Take 45 mg by mouth at bedtime.    [provider]  QUEtiapine (SEROQUEL XR) 400 MG 24 hr tablet Take 800 mg by mouth at bedtime. 11/16/21   [provider]  tiZANidine (ZANAFLEX) 4 MG tablet Take 4 mg by mouth at bedtime.  12/17/19   [provider]      Allergies    Lyrica [pregabalin]    Review of Systems   Review of Systems  Cardiovascular:  Positive for chest pain.    Physical Exam Updated Vital Signs BP 121/82   Pulse (!) 101   Temp 98.2 F (36.8 C) (Oral)   Resp 19   SpO2 97%  Physical Exam Vitals and nursing note reviewed.  HENT:     Head: Normocephalic.  Cardiovascular:     Rate and Rhythm: Normal rate.  Pulmonary:     Breath sounds: Rhonchi present. No decreased breath sounds or wheezing.  Chest:     Chest wall: Tenderness present.     Comments: Tenderness to anterior chest pain. Abdominal:     Tenderness: There is no abdominal tenderness.  Skin:    General: Skin is  warm.     Capillary Refill: Capillary refill takes less than 2 seconds.  Neurological:     Mental Status: He is alert.     ED Results / Procedures / Treatments   Labs (all labs ordered are listed, but only abnormal results are displayed) Labs Reviewed  BASIC METABOLIC PANEL - Abnormal; Notable for the following components:      Result Value   Glucose, Bld 112 (*)    BUN 5 (*)    Calcium 8.8 (*)    All other components within normal limits  CBC  D-DIMER, QUANTITATIVE  TROPONIN I (HIGH SENSITIVITY)  TROPONIN I (HIGH SENSITIVITY)    EKG EKG Interpretation  Date/Time:  Saturday February 03 2022 15:00:59 EST Ventricular Rate:  101 PR Interval:  174 QRS Duration: 104 QT Interval:  396 QTC Calculation: 513 R Axis:   -41 Text Interpretation: Sinus tachycardia with Fusion  complexes Possible Left atrial enlargement Left axis deviation Left ventricular hypertrophy with repolarization abnormality ( R in aVL , Cornell product ) Abnormal ECG When compared with ECG of 26-Jan-2022 20:46, QRS axis Shifted left T wave inversion no longer evident in Inferior leads T wave inversion now evident in Lateral leads Confirmed by Benjiman Core [phone]) on 02/03/2022 3:50:29 PM  Radiology DG Chest 2 View  Result Date: 02/03/2022 CLINICAL DATA:  Chest pain. EXAM: CHEST - 2 VIEW COMPARISON:  December 08, 2021 FINDINGS: Spinal stimulator leads are identified. The heart, hila, and mediastinum are normal. No pneumothorax. No nodules or masses. Mild atelectasis or scar in the left base. No suspicious infiltrate. IMPRESSION: No active cardiopulmonary disease. Electronically Signed   By: Gerome Sam III M.D.   On: 02/03/2022 16:00    Procedures Procedures    Medications Ordered in ED Medications - No data to display  ED Course/ Medical Decision Making/ A&P                           Medical Decision Making Amount and/or Complexity of Data Reviewed Labs: ordered. Radiology: ordered.   Patient with chest pain.  Anterior chest.  Has had for a week.  Had troponins mildly elevated on the 10th, however reviewing previous visits appears to be somewhat always elevated.  Not exertional.  EKG overall reassuring with some lateral nonspecific changes.  Troponin negative x2 here.  Chest x-ray reassuring.  D-dimer negative.  Doubt pulmonary embolism.  I think patient is stable for outpatient follow-up.  Initially was somewhat hypertensive.  However blood pressure has now normalized.  Has not seen cardiology before.  We will have patient follow-up with cardiology.  Ambulatory referral placed.  Will discharge home.        Final Clinical Impression(s) / ED Diagnoses Final diagnoses:  Nonspecific chest pain    Rx / DC Orders ED Discharge Orders          Ordered    Ambulatory  referral to Cardiology       Comments: If you have not heard from the Cardiology office within the next 72 hours please call [phone].   02/03/22 1825              Benjiman Core, MD 02/03/22 [phone]

## 2022-02-05 ENCOUNTER — Ambulatory Visit: Payer: Medicaid Other

## 2022-02-05 ENCOUNTER — Telehealth: Payer: Self-pay

## 2022-02-05 NOTE — Therapy (Incomplete)
OUTPATIENT PHYSICAL THERAPY TREATMENT NOTE   Patient Name: Justin [name] MRN: [id number] DOB:[birth year], 56 y.o., male Today's Date: 02/05/2022  PCP: [facility], [facility] REFERRING PROVIDER: Damita Lack, MD  END OF SESSION:        Past Medical History:  Diagnosis Date   Achilles rupture, left    Bipolar 1 disorder ([place])    Dental caries    periodontitis   Pneumonia    Pulmonary embolism ([place]) 05/18/2007   Sleep apnea    wears CPAP   Subdural hematoma ([place]) 01/04/2013   in setting of supratherapeutic INR   Past Surgical History:  Procedure Laterality Date   ACHILLES TENDON SURGERY Left 10/21/2014   Procedure: Left Achilles Reconstruction;  Surgeon: Newt Minion, MD;  Location: [place];  Service: Orthopedics;  Laterality: Left;   APPENDECTOMY     CARDIAC CATHETERIZATION  03/04/2018   [facility]: Normal coronaries, LVEF estimated at 40%, medical Rx   CRANIOTOMY N/A 01/19/2013   Procedure: CRANIOTOMY HEMATOMA EVACUATION SUBDURAL;  Surgeon: Hosie Spangle, MD;  Location: [place] [facility];  Service: Neurosurgery;  Laterality: N/A;   CYSTECTOMY     right head   ELBOW SURGERY     right   FRACTURE SURGERY     finger   I & D EXTREMITY Left 12/07/2016   Procedure: LEFT ACHILLES DEBRIDEMENT;  Surgeon: Newt Minion, MD;  Location: [place];  Service: Orthopedics;  Laterality: Left;   LUMBAR FUSION  12/23/2017   L5 GILL PROCEDURE, RIGHT L5-S1, TRANSFORAMIAL LUMBAR INTERBODY FUSION, BILATERAL LATERAL FUSION, PEDICLE INSTRUMENTATION   MULTIPLE EXTRACTIONS WITH ALVEOLOPLASTY N/A 03/07/2015   Procedure: MULTIPLE EXTRACTION WITH ALVEOLOPLASTY;  Surgeon: Diona Browner, DDS;  Location: [place];  Service: Oral Surgery;  Laterality: N/A;   SPINAL CORD STIMULATOR INSERTION N/A 05/18/2019   Procedure: LUMBAR SPINAL CORD STIMULATOR INSERTION;  Surgeon: Clydell Hakim, MD;  Location: [place];  Service: Neurosurgery;  Laterality: N/A;  Thoracic/Lumbar   SPINAL CORD STIMULATOR INSERTION N/A  04/06/2020   Procedure: Revision of spinal cord stimulator;  Surgeon: Reece Agar, MD;  Location: [place];  Service: Neurosurgery;  Laterality: N/A;   Patient Active Problem List   Diagnosis Date Noted   Hematoma of left thigh    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   Leukocytosis 12/22/2021   SIRS (systemic inflammatory response syndrome) ([place]) 12/22/2021   Normocytic anemia 12/22/2021   Hematoma 12/09/2021   Acute deep vein thrombosis (DVT) of right peroneal vein ([place]) 10/19/2021   History of pulmonary embolism 10/19/2021   Status post lumbar spinal fusion 01/03/2018   Lumbar stenosis 12/23/2017   Spondylolisthesis, lumbar region 06/25/2017   Achilles tendinitis of left lower extremity 12/07/2016   Achilles tendinitis, left leg 05/17/2016   Obstructive sleep apnea on CPAP 03/07/2015   Post-operative state 03/07/2015   Achilles rupture, left 10/21/2014   OSA (obstructive sleep apnea) 04/23/2013   Bipolar disorder, unspecified ([place]) 01/07/2013   Sinus tachycardia 01/07/2013   Headache(784.0) 01/07/2013   SDH (subdural hematoma) ([place]) 01/04/2013   H/O PE 01/04/2013   Chronic anticoagulation 01/04/2013   Warfarin-induced coagulopathy ([place]) 01/04/2013   Acute sinusitis 01/04/2013    REFERRING DIAG: A56.97XY (ICD-10-CM) - Hematoma of left thigh, initial encounter  THERAPY DIAG:  No diagnosis found.  Rationale for Evaluation and Treatment Rehabilitation  PERTINENT HISTORY:  Bipolar disorder Pulmonary embolism  Subdural hematoma Lumbar fusion  Spinal cord stimulator   PRECAUTIONS: Fall; history of DVT/PE on anti-coags  SUBJECTIVE:  SUBJECTIVE STATEMENT:  Patient reports his leg is feeling a little better. He reached out to his PCP and was instructed that he could use compression socks for his swelling and has  been using them off/on. He has f/u for his chronic back pain with specialist on 02/07/22.    PAIN:  Are you having pain? Yes: NPRS scale: 6/10 Pain location: Lt thigh;ankle Pain description: tender Aggravating factors: walking Relieving factors: laying down   OBJECTIVE: (objective measures completed at initial evaluation unless otherwise dated)  DIAGNOSTIC FINDINGS:  LLE CT scan: IMPRESSION: 1. No arterial stenosis.  No findings of deep venous thrombosis. 2. Trace calcific plaques in the left internal iliac artery and scattered along the left anterior and posterior tibial arteries. 3. As compared to the study of 12/09/2021, large collection in the vastus lateralis muscle appears more organized and is more complex, with numerous enhancing septations and multiple tiny fluid pockets. The appearance is most suggestive of a pyomyositis although component of hematoma could still be present. No soft tissue gas is seen but there is edema in the anterior compartment deep soft tissue planes which can be seen with fasciitis. 4. Generalized left lower extremity edema. No subcutaneous abscess or soft tissue gas. 5. 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 No overlying cortical subsidence or significant findings of secondary hip DJD. 6. 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 7. Stable 1.7 cm soft tissue nodule in the left ischioanal fossa subcutaneous fat. Nonspecific. Follow-up as indicated. 8. Mildly enlarged lymph nodes in the left external iliac and inguinal chains probably reactive. 9. Mild prostatomegaly.     PATIENT SURVEYS:  LEFS 27/80   COGNITION:           Overall cognitive status: Within functional limits for tasks assessed                          SENSATION: WFL   EDEMA:  Diffuse swelling about LLE; discoloration about distal LLE   MUSCLE LENGTH: Hamstrings: lacking 55 Lt; lacking 20 Rt  01/15/22: Lt lacking 35 degrees    POSTURE: No Significant postural  limitations   PALPATION: Diffuse tenderness about left lower leg and quad    LOWER EXTREMITY ROM:   Active ROM Right eval Left eval 01/24/22  Hip flexion       Hip extension       Hip abduction       Hip adduction       Hip internal rotation       Hip external rotation       Knee flexion 128 120   Knee extension WNL WNL   Ankle dorsiflexion 8 0 3  Ankle plantarflexion       Ankle inversion       Ankle eversion        (Blank rows = not tested)   LOWER EXTREMITY MMT:   MMT Right eval Left eval  Hip flexion 5 5  Hip extension      Hip abduction      Hip adduction      Hip internal rotation      Hip external rotation      Knee flexion 5 5  Knee extension 5 4-  Ankle dorsiflexion 5 3-  Ankle plantarflexion      Ankle inversion      Ankle eversion       (Blank rows = not tested)   LOWER EXTREMITY SPECIAL TESTS:  (+)  [place]    FUNCTIONAL TESTS:  TUG 14.5 seconds 5 x STS: 20 seconds  SLS: 6 seconds LLE; 9 seconds RLE    GAIT: Distance walked: 25 ft Assistive device utilized: None Level of assistance: Complete Independence Comments: occasional buckling of the Lt knee; foot flat initial contact.        TODAY'S TREATMENT:  [facility] Adult PT Treatment:                                                DATE: 01/29/22 Therapeutic Exercise: Recumbent bike level 3 x 5 minutes  Prone quad stretch x 60 seconds  Hamstring stretch with strap x 60 seconds  Resisted knee extension 3 x 10@ 20 lbs  Resisted hamstring curl 3 x 10 @ 35 lbs  Leg press 3 x 10 @ 80 lbs  SLS 2 x 20 sec each  TKE black band 2 x 10   [facility] Adult PT Treatment:                                                DATE: 01/24/22 Therapeutic Exercise: Recumbent bike level 3 x 5 minutes  Prone quad stretch x 60 sec  Sidelying hip abduction 2 x 10  Mini squat with UE support 2 x 10  Standing march 2 x 10  Calf raise 2 x 15  Updated HEP    [facility] Adult PT Treatment:                                                 DATE: 01/22/22 Therapeutic Exercise: Bike level 2 x 5 minutes  Calf stretch on wedge x 60 seconds  HS stretch with strap x 60 seconds Quad stretch with strap x 60 seconds  Sit to stand 2 x 10  TKE blue band 2 x 10  LAQ 2 x 10; 4 lbs  Standing calf raise 2 x 15  Standing toe raises 2 x 15      PATIENT EDUCATION:  Education details: HEP Person educated: patient  Education method: Systems developer, cues, handout Education comprehension: returned demo, cues      HOME EXERCISE PROGRAM: Access Code: [email or web address] URL: [email or web address] Date: 01/05/2022 Prepared by: Gwendolyn Grant   Exercises - Seated Long Arc Quad  - 2 x daily - 7 x weekly - 2 sets - 10 reps - Seated Toe Raise  - 2 x daily - 7 x weekly - 2 sets - 10 reps - Long Sitting Calf Stretch with Strap  - 2 x daily - 7 x weekly - 3 sets - 30 sec  hold - Seated Hamstring Stretch  - 2 x daily - 7 x weekly - 3 sets - 30 sec  hold   ASSESSMENT:   CLINICAL IMPRESSION: Patient tolerated session well today focusing on progression of LE strengthening. Introduced Patent examiner today with patient demonstrating good form and control with all exercises. He demonstrates fair SL balance bilaterally requiring frequent use of reaching strategy to maintain SLS. No reports of increased LLE pain throughout session.      OBJECTIVE IMPAIRMENTS Abnormal gait,  decreased activity tolerance, decreased balance, decreased knowledge of condition, difficulty walking, decreased ROM, decreased strength, impaired flexibility, improper body mechanics, and pain.    ACTIVITY LIMITATIONS carrying, lifting, bending, standing, squatting, stairs, transfers, and locomotion level   PARTICIPATION LIMITATIONS: meal prep, cleaning, laundry, shopping, community activity, and yard work   PERSONAL FACTORS Age, Time since onset of injury/illness/exacerbation, and 3+ comorbidities: see PMH above   are also affecting patient's functional outcome.    REHAB  POTENTIAL: Good   CLINICAL DECISION MAKING: Evolving/moderate complexity   EVALUATION COMPLEXITY: Moderate     GOALS: Goals reviewed with patient? Yes   SHORT TERM GOALS: Target date: 01/26/22 Patient will be independent and compliant with initial HEP.    Baseline: issued at eval  Goal status: met   2.  Patient will demonstrate at least 5 degrees of Lt ankle DF AROM to improve gait mechanics.  Baseline: 0 Goal status: ongoing    3.  Patient will improve Lt hamstring flexibility by at least 15 degrees to reduce stress on the LLE with walking.  Baseline: lacking 55 Goal status: met      LONG TERM GOALS: Target date: 02/17/22   Patient will demonstrate 5/5 Lt knee extensor strength to reduce buckling with walking.  Baseline: see above Goal status: INITIAL   2.  Patient will demonstrate 5/5 Lt ankle DF strength to improve ability to heel strike.  Baseline: see above  Goal status: INITIAL   3.  Patient will score at least 40/80 on the LEFS to signify clinically meaningful improvement in functional abilities.    Baseline: 27/80 Goal status: INITIAL   4.  Patient will complete 5 x STS </= 13 seconds to improve his functional strength and ease of transfers.  Baseline: see above  Goal status: INITIAL   5.  Patient will ambulate community distances without need for AD.  Baseline:  Goal status: INITIAL     PLAN: PT FREQUENCY: 2x/week   PT DURATION: 6 weeks   PLANNED INTERVENTIONS: Therapeutic exercises, Therapeutic activity, Neuromuscular re-education, Balance training, Gait training, Patient/Family education, Self Care, Manual therapy, and Re-evaluation   PLAN FOR NEXT SESSION: LLE stretching and strengthening; review and update HEP prn; balance training    Gwendolyn Grant, PT, DPT, ATC 02/05/22 9:04 AM

## 2022-02-05 NOTE — Telephone Encounter (Signed)
LVM regarding missed PT appointment. Reviewed attendance policy and reminded patient of next scheduled visit.   [name], PT, DPT, ATC 02/05/22 4:12 PM

## 2022-02-06 NOTE — Therapy (Signed)
OUTPATIENT PHYSICAL THERAPY TREATMENT NOTE   Patient Name: Justin [name] MRN: [id number] DOB:06-Mar-1966, 56 y.o., male Today's Date: 02/07/2022  PCP: [facility] REFERRING PROVIDER: Damita Lack, MD  END OF SESSION:   PT End of Session - 02/07/22 1453     Visit Number 7    Number of Visits 13    Date for PT Re-Evaluation 02/17/22    Authorization Type MCD    Authorization Time Period 10/24-12/4/23    Authorization - Visit Number 6    Authorization - Number of Visits 12    PT Start Time 5284   patient late   PT Stop Time 1529    PT Time Calculation (min) 35 min    Activity Tolerance Patient tolerated treatment well    Behavior During Therapy [facility] for tasks assessed/performed                 Past Medical History:  Diagnosis Date   Achilles rupture, left    Bipolar 1 disorder ([place])    Dental caries    periodontitis   Pneumonia    Pulmonary embolism ([place]) 05/18/2007   Sleep apnea    wears CPAP   Subdural hematoma ([place]) 01/04/2013   in setting of supratherapeutic INR   Past Surgical History:  Procedure Laterality Date   ACHILLES TENDON SURGERY Left 10/21/2014   Procedure: Left Achilles Reconstruction;  Surgeon: Newt Minion, MD;  Location: [place];  Service: Orthopedics;  Laterality: Left;   APPENDECTOMY     CARDIAC CATHETERIZATION  03/04/2018   [facility]: Normal coronaries, LVEF estimated at 40%, medical Rx   CRANIOTOMY N/A 01/19/2013   Procedure: CRANIOTOMY HEMATOMA EVACUATION SUBDURAL;  Surgeon: Hosie Spangle, MD;  Location: [place] [facility]UR[facility];  Service: Neurosurgery;  Laterality: N/A;   CYSTECTOMY     right head   ELBOW SURGERY     right   FRACTURE SURGERY     finger   I & D EXTREMITY Left 12/07/2016   Procedure: LEFT ACHILLES DEBRIDEMENT;  Surgeon: Newt Minion, MD;  Location: [place];  Service: Orthopedics;  Laterality: Left;   LUMBAR FUSION  12/23/2017   L5 GILL PROCEDURE, RIGHT L5-S1, TRANSFORAMIAL LUMBAR INTERBODY FUSION, BILATERAL  LATERAL FUSION, PEDICLE INSTRUMENTATION   MULTIPLE EXTRACTIONS WITH ALVEOLOPLASTY N/A 03/07/2015   Procedure: MULTIPLE EXTRACTION WITH ALVEOLOPLASTY;  Surgeon: Diona Browner, DDS;  Location: [place];  Service: Oral Surgery;  Laterality: N/A;   SPINAL CORD STIMULATOR INSERTION N/A 05/18/2019   Procedure: LUMBAR SPINAL CORD STIMULATOR INSERTION;  Surgeon: Clydell Hakim, MD;  Location: [place];  Service: Neurosurgery;  Laterality: N/A;  Thoracic/Lumbar   SPINAL CORD STIMULATOR INSERTION N/A 04/06/2020   Procedure: Revision of spinal cord stimulator;  Surgeon: Reece Agar, MD;  Location: [place];  Service: Neurosurgery;  Laterality: N/A;   Patient Active Problem List   Diagnosis Date Noted   Hematoma of left thigh    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   Leukocytosis 12/22/2021   SIRS (systemic inflammatory response syndrome) ([place]) 12/22/2021   Normocytic anemia 12/22/2021   Hematoma 12/09/2021   Acute deep vein thrombosis (DVT) of right peroneal vein ([place]) 10/19/2021   History of pulmonary embolism 10/19/2021   Status post lumbar spinal fusion 01/03/2018   Lumbar stenosis 12/23/2017   Spondylolisthesis, lumbar region 06/25/2017   Achilles tendinitis of left lower extremity 12/07/2016   Achilles tendinitis, left leg 05/17/2016   Obstructive sleep apnea on CPAP 03/07/2015   Post-operative state 03/07/2015   Achilles rupture, left  10/21/2014   OSA (obstructive sleep apnea) 04/23/2013   Bipolar disorder, unspecified ([place]) 01/07/2013   Sinus tachycardia 01/07/2013   Headache(784.0) 01/07/2013   SDH (subdural hematoma) ([place]) 01/04/2013   H/O PE 01/04/2013   Chronic anticoagulation 01/04/2013   Warfarin-induced coagulopathy ([place]) 01/04/2013   Acute sinusitis 01/04/2013    REFERRING DIAG: V37.10GY (ICD-10-CM) - Hematoma of left thigh, initial encounter  THERAPY DIAG:  Other abnormalities of gait and mobility  Pain of left lower extremity  Muscle weakness (generalized)  Rationale for Evaluation and  Treatment Rehabilitation  PERTINENT HISTORY:  Bipolar disorder Pulmonary embolism  Subdural hematoma Lumbar fusion  Spinal cord stimulator   PRECAUTIONS: Fall; history of DVT/PE on anti-coags  SUBJECTIVE:                                                                                                                                                                                      SUBJECTIVE STATEMENT:  Patient had appointment with spine specialist, but authorization is still pending for surgery to remove the spinal cord stimulator. Patient went to the ED over the weekend for chest pain and was discharged with recommendation to f/u with cardiology. This appointment is scheduled for 12/11. He reports the leg has been "good" without pain.    PAIN:  Are you having pain? No   OBJECTIVE: (objective measures completed at initial evaluation unless otherwise dated)  DIAGNOSTIC FINDINGS:  LLE CT scan: IMPRESSION: 1. No arterial stenosis.  No findings of deep venous thrombosis. 2. Trace calcific plaques in the left internal iliac artery and scattered along the left anterior and posterior tibial arteries. 3. As compared to the study of 12/09/2021, large collection in the vastus lateralis muscle appears more organized and is more complex, with numerous enhancing septations and multiple tiny fluid pockets. The appearance is most suggestive of a pyomyositis although component of hematoma could still be present. No soft tissue gas is seen but there is edema in the anterior compartment deep soft tissue planes which can be seen with fasciitis. 4. Generalized left lower extremity edema. No subcutaneous abscess or soft tissue gas. 5. 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 No overlying cortical subsidence or significant findings of secondary hip DJD. 6. 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 7. Stable 1.7 cm soft tissue nodule in the left ischioanal fossa subcutaneous fat. Nonspecific.  Follow-up as indicated. 8. Mildly enlarged lymph nodes in the left external iliac and inguinal chains probably reactive. 9. Mild prostatomegaly.     PATIENT SURVEYS:  LEFS 27/80   COGNITION:           Overall cognitive status: Within functional limits for tasks assessed  SENSATION: WFL   EDEMA:  Diffuse swelling about LLE; discoloration about distal LLE   MUSCLE LENGTH: Hamstrings: lacking 55 Lt; lacking 20 Rt  01/15/22: Lt lacking 35 degrees    POSTURE: No Significant postural limitations   PALPATION: Diffuse tenderness about left lower leg and quad    LOWER EXTREMITY ROM:   Active ROM Right eval Left eval 01/24/22 02/07/22 Left   Hip flexion        Hip extension        Hip abduction        Hip adduction        Hip internal rotation        Hip external rotation        Knee flexion 128 120    Knee extension WNL WNL    Ankle dorsiflexion 8 0 3 5  Ankle plantarflexion        Ankle inversion        Ankle eversion         (Blank rows = not tested)   LOWER EXTREMITY MMT:   MMT Right eval Left eval  Hip flexion 5 5  Hip extension      Hip abduction      Hip adduction      Hip internal rotation      Hip external rotation      Knee flexion 5 5  Knee extension 5 4-  Ankle dorsiflexion 5 3-  Ankle plantarflexion      Ankle inversion      Ankle eversion       (Blank rows = not tested)   LOWER EXTREMITY SPECIAL TESTS:  (+) [place]    FUNCTIONAL TESTS:  TUG 14.5 seconds 5 x STS: 20 seconds  SLS: 6 seconds LLE; 9 seconds RLE    GAIT: Distance walked: 25 ft Assistive device utilized: None Level of assistance: Complete Independence Comments: occasional buckling of the Lt knee; foot flat initial contact.        TODAY'S TREATMENT: [facility] Adult PT Treatment:                                                DATE: 02/07/22 Therapeutic Exercise: Recumbent bike level 3 x 5 minutes  Sit to stand 2 x 10  Cybex hip flexion 2 x 10; 25 lbs   Cybex hip abduction 2 x 10; 25 lbs  SLR 2 x 10   [facility] Adult PT Treatment:                                                DATE: 01/29/22 Therapeutic Exercise: Recumbent bike level 3 x 5 minutes  Prone quad stretch x 60 seconds  Hamstring stretch with strap x 60 seconds  Resisted knee extension 3 x 10@ 20 lbs  Resisted hamstring curl 3 x 10 @ 35 lbs  Leg press 3 x 10 @ 80 lbs  SLS 2 x 20 sec each  TKE black band 2 x 10   [facility] Adult PT Treatment:  DATE: 01/24/22 Therapeutic Exercise: Recumbent bike level 3 x 5 minutes  Prone quad stretch x 60 sec  Sidelying hip abduction 2 x 10  Mini squat with UE support 2 x 10  Standing march 2 x 10  Calf raise 2 x 15  Updated HEP      PATIENT EDUCATION:  Education details: HEP Person educated: patient  Education method: Systems developer, cues, handout Education comprehension: returned demo, cues      HOME EXERCISE PROGRAM: Access Code: [email or web address] URL: [email or web address] Date: 01/05/2022 Prepared by: Gwendolyn Grant   Exercises - Seated Long Arc Quad  - 2 x daily - 7 x weekly - 2 sets - 10 reps - Seated Toe Raise  - 2 x daily - 7 x weekly - 2 sets - 10 reps - Long Sitting Calf Stretch with Strap  - 2 x daily - 7 x weekly - 3 sets - 30 sec  hold - Seated Hamstring Stretch  - 2 x daily - 7 x weekly - 3 sets - 30 sec  hold   ASSESSMENT:   CLINICAL IMPRESSION: Session somewhat limited as patient was late for scheduled visit. Patient tolerated session well today with no adverse effects. Focused on progression of standing strengthening utilizing resistance machines with good tolerance. He fatigues fairly quickly with standing strengthening reporting more difficulty with strengthening on the RLE compared to LLE. His DF AROM has improved having met this STG. No reports of leg pain throughout session.      OBJECTIVE IMPAIRMENTS Abnormal gait, decreased activity tolerance, decreased balance,  decreased knowledge of condition, difficulty walking, decreased ROM, decreased strength, impaired flexibility, improper body mechanics, and pain.    ACTIVITY LIMITATIONS carrying, lifting, bending, standing, squatting, stairs, transfers, and locomotion level   PARTICIPATION LIMITATIONS: meal prep, cleaning, laundry, shopping, community activity, and yard work   PERSONAL FACTORS Age, Time since onset of injury/illness/exacerbation, and 3+ comorbidities: see PMH above   are also affecting patient's functional outcome.    REHAB POTENTIAL: Good   CLINICAL DECISION MAKING: Evolving/moderate complexity   EVALUATION COMPLEXITY: Moderate     GOALS: Goals reviewed with patient? Yes   SHORT TERM GOALS: Target date: 01/26/22 Patient will be independent and compliant with initial HEP.    Baseline: issued at eval  Goal status: met   2.  Patient will demonstrate at least 5 degrees of Lt ankle DF AROM to improve gait mechanics.  Baseline: 0 Goal status: met    3.  Patient will improve Lt hamstring flexibility by at least 15 degrees to reduce stress on the LLE with walking.  Baseline: lacking 55 Goal status: met      LONG TERM GOALS: Target date: 02/17/22   Patient will demonstrate 5/5 Lt knee extensor strength to reduce buckling with walking.  Baseline: see above Goal status: INITIAL   2.  Patient will demonstrate 5/5 Lt ankle DF strength to improve ability to heel strike.  Baseline: see above  Goal status: INITIAL   3.  Patient will score at least 40/80 on the LEFS to signify clinically meaningful improvement in functional abilities.    Baseline: 27/80 Goal status: INITIAL   4.  Patient will complete 5 x STS </= 13 seconds to improve his functional strength and ease of transfers.  Baseline: see above  Goal status: INITIAL   5.  Patient will ambulate community distances without need for AD.  Baseline:  Goal status: INITIAL     PLAN: PT FREQUENCY: 2x/week   PT DURATION: 6  weeks   PLANNED INTERVENTIONS: Therapeutic exercises, Therapeutic activity, Neuromuscular re-education, Balance training, Gait training, Patient/Family education, Self Care, Manual therapy, and Re-evaluation   PLAN FOR NEXT SESSION: LLE stretching and strengthening; review and update HEP prn; balance training    Gwendolyn Grant, PT, DPT, ATC 02/07/22 3:30 PM

## 2022-02-07 ENCOUNTER — Ambulatory Visit: Payer: Medicaid Other

## 2022-02-07 DIAGNOSIS — R2689 Other abnormalities of gait and mobility: Secondary | ICD-10-CM | POA: Diagnosis not present

## 2022-02-07 DIAGNOSIS — M79605 Pain in left leg: Secondary | ICD-10-CM

## 2022-02-07 DIAGNOSIS — M6281 Muscle weakness (generalized): Secondary | ICD-10-CM

## 2022-02-12 ENCOUNTER — Ambulatory Visit: Payer: Medicaid Other

## 2022-02-12 DIAGNOSIS — R2689 Other abnormalities of gait and mobility: Secondary | ICD-10-CM | POA: Diagnosis not present

## 2022-02-12 DIAGNOSIS — M6281 Muscle weakness (generalized): Secondary | ICD-10-CM

## 2022-02-12 DIAGNOSIS — M79605 Pain in left leg: Secondary | ICD-10-CM

## 2022-02-12 NOTE — Therapy (Signed)
OUTPATIENT PHYSICAL THERAPY TREATMENT NOTE   Patient Name: Justin [name] MRN: [id number] DOB:[birth year], 56 y.o., male Today's Date: 02/12/2022  PCP: [facility] REFERRING PROVIDER: Damita Lack, MD  END OF SESSION:   PT End of Session - 02/12/22 1520     Visit Number 8    Number of Visits 13    Date for PT Re-Evaluation 02/17/22    Authorization Type MCD    Authorization Time Period 10/24-12/4/23    Authorization - Visit Number 7    Authorization - Number of Visits 12    PT Start Time 1520    PT Stop Time 1600    PT Time Calculation (min) 40 min    Activity Tolerance Patient tolerated treatment well    Behavior During Therapy [facility] for tasks assessed/performed                  Past Medical History:  Diagnosis Date   Achilles rupture, left    Bipolar 1 disorder ([place])    Dental caries    periodontitis   Pneumonia    Pulmonary embolism ([place]) 05/18/2007   Sleep apnea    wears CPAP   Subdural hematoma ([place]) 01/04/2013   in setting of supratherapeutic INR   Past Surgical History:  Procedure Laterality Date   ACHILLES TENDON SURGERY Left 10/21/2014   Procedure: Left Achilles Reconstruction;  Surgeon: Newt Minion, MD;  Location: [place];  Service: Orthopedics;  Laterality: Left;   APPENDECTOMY     CARDIAC CATHETERIZATION  03/04/2018   [facility]: Normal coronaries, LVEF estimated at 40%, medical Rx   CRANIOTOMY N/A 01/19/2013   Procedure: CRANIOTOMY HEMATOMA EVACUATION SUBDURAL;  Surgeon: Hosie Spangle, MD;  Location: [place] NE[facility]S;  Service: Neurosurgery;  Laterality: N/A;   CYSTECTOMY     right head   ELBOW SURGERY     right   FRACTURE SURGERY     finger   I & D EXTREMITY Left 12/07/2016   Procedure: LEFT ACHILLES DEBRIDEMENT;  Surgeon: Newt Minion, MD;  Location: [place];  Service: Orthopedics;  Laterality: Left;   LUMBAR FUSION  12/23/2017   L5 GILL PROCEDURE, RIGHT L5-S1, TRANSFORAMIAL LUMBAR INTERBODY FUSION, BILATERAL LATERAL  FUSION, PEDICLE INSTRUMENTATION   MULTIPLE EXTRACTIONS WITH ALVEOLOPLASTY N/A 03/07/2015   Procedure: MULTIPLE EXTRACTION WITH ALVEOLOPLASTY;  Surgeon: Diona Browner, DDS;  Location: [place];  Service: Oral Surgery;  Laterality: N/A;   SPINAL CORD STIMULATOR INSERTION N/A 05/18/2019   Procedure: LUMBAR SPINAL CORD STIMULATOR INSERTION;  Surgeon: Clydell Hakim, MD;  Location: [place];  Service: Neurosurgery;  Laterality: N/A;  Thoracic/Lumbar   SPINAL CORD STIMULATOR INSERTION N/A 04/06/2020   Procedure: Revision of spinal cord stimulator;  Surgeon: Reece Agar, MD;  Location: [place];  Service: Neurosurgery;  Laterality: N/A;   Patient Active Problem List   Diagnosis Date Noted   Hematoma of left thigh    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   Leukocytosis 12/22/2021   SIRS (systemic inflammatory response syndrome) ([place]) 12/22/2021   Normocytic anemia 12/22/2021   Hematoma 12/09/2021   Acute deep vein thrombosis (DVT) of right peroneal vein ([place]) 10/19/2021   History of pulmonary embolism 10/19/2021   Status post lumbar spinal fusion 01/03/2018   Lumbar stenosis 12/23/2017   Spondylolisthesis, lumbar region 06/25/2017   Achilles tendinitis of left lower extremity 12/07/2016   Achilles tendinitis, left leg 05/17/2016   Obstructive sleep apnea on CPAP 03/07/2015   Post-operative state 03/07/2015   Achilles rupture, left 10/21/2014  OSA (obstructive sleep apnea) 04/23/2013   Bipolar disorder, unspecified ([place]) 01/07/2013   Sinus tachycardia 01/07/2013   Headache(784.0) 01/07/2013   SDH (subdural hematoma) ([place]) 01/04/2013   H/O PE 01/04/2013   Chronic anticoagulation 01/04/2013   Warfarin-induced coagulopathy ([place]) 01/04/2013   Acute sinusitis 01/04/2013    REFERRING DIAG: N56.21HY (ICD-10-CM) - Hematoma of left thigh, initial encounter  THERAPY DIAG:  Other abnormalities of gait and mobility  Pain of left lower extremity  Muscle weakness (generalized)  Rationale for Evaluation and Treatment  Rehabilitation  PERTINENT HISTORY:  Bipolar disorder Pulmonary embolism  Subdural hematoma Lumbar fusion  Spinal cord stimulator   PRECAUTIONS: Fall; history of DVT/PE on anti-coags  SUBJECTIVE:                                                                                                                                                                                      SUBJECTIVE STATEMENT:  Patient reports a little pain and tenderness about the left lateral lower leg today.    PAIN:  Are you having pain? Yes: NPRS scale: 7/10 Pain location: Lt lateral lower leg Pain description: tender Aggravating factors: palpation Relieving factors: unknown   OBJECTIVE: (objective measures completed at initial evaluation unless otherwise dated)  DIAGNOSTIC FINDINGS:  LLE CT scan: IMPRESSION: 1. No arterial stenosis.  No findings of deep venous thrombosis. 2. Trace calcific plaques in the left internal iliac artery and scattered along the left anterior and posterior tibial arteries. 3. As compared to the study of 12/09/2021, large collection in the vastus lateralis muscle appears more organized and is more complex, with numerous enhancing septations and multiple tiny fluid pockets. The appearance is most suggestive of a pyomyositis although component of hematoma could still be present. No soft tissue gas is seen but there is edema in the anterior compartment deep soft tissue planes which can be seen with fasciitis. 4. Generalized left lower extremity edema. No subcutaneous abscess or soft tissue gas. 5. 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 No overlying cortical subsidence or significant findings of secondary hip DJD. 6. 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 7. Stable 1.7 cm soft tissue nodule in the left ischioanal fossa subcutaneous fat. Nonspecific. Follow-up as indicated. 8. Mildly enlarged lymph nodes in the left external iliac and inguinal chains probably  reactive. 9. Mild prostatomegaly.     PATIENT SURVEYS:  LEFS 27/80   COGNITION:           Overall cognitive status: Within functional limits for tasks assessed  SENSATION: WFL   EDEMA:  Diffuse swelling about LLE; discoloration about distal LLE   MUSCLE LENGTH: Hamstrings: lacking 55 Lt; lacking 20 Rt  01/15/22: Lt lacking 35 degrees    POSTURE: No Significant postural limitations   PALPATION: Diffuse tenderness about left lower leg and quad    LOWER EXTREMITY ROM:   Active ROM Right eval Left eval 01/24/22 02/07/22 Left   Hip flexion        Hip extension        Hip abduction        Hip adduction        Hip internal rotation        Hip external rotation        Knee flexion 128 120    Knee extension WNL WNL    Ankle dorsiflexion 8 0 3 5  Ankle plantarflexion        Ankle inversion        Ankle eversion         (Blank rows = not tested)   LOWER EXTREMITY MMT:   MMT Right eval Left eval  Hip flexion 5 5  Hip extension      Hip abduction      Hip adduction      Hip internal rotation      Hip external rotation      Knee flexion 5 5  Knee extension 5 4-  Ankle dorsiflexion 5 3-  Ankle plantarflexion      Ankle inversion      Ankle eversion       (Blank rows = not tested)   LOWER EXTREMITY SPECIAL TESTS:  (+) [place]    FUNCTIONAL TESTS:  TUG 14.5 seconds 5 x STS: 20 seconds  SLS: 6 seconds LLE; 9 seconds RLE    GAIT: Distance walked: 25 ft Assistive device utilized: None Level of assistance: Complete Independence Comments: occasional buckling of the Lt knee; foot flat initial contact.        TODAY'S TREATMENT: [facility] Adult PT Treatment:                                                DATE: 02/12/22 Therapeutic Exercise: Recumbent bike level 3 x 5 minutes  Calf stretch on wedge x 60 seconds Ankle rockerboard A/P 2 x 10  4 way ankle blue band 2 x 10  Calf raise on airex 2 x 10  Tandem balance 3 x 30 sec  SLS x 20 sec  each   [facility] Adult PT Treatment:                                                DATE: 02/07/22 Therapeutic Exercise: Recumbent bike level 3 x 5 minutes  Sit to stand 2 x 10  Cybex hip flexion 2 x 10; 25 lbs  Cybex hip abduction 2 x 10; 25 lbs  SLR 2 x 10   [facility] Adult PT Treatment:                                                DATE: 01/29/22  Therapeutic Exercise: Recumbent bike level 3 x 5 minutes  Prone quad stretch x 60 seconds  Hamstring stretch with strap x 60 seconds  Resisted knee extension 3 x 10@ 20 lbs  Resisted hamstring curl 3 x 10 @ 35 lbs  Leg press 3 x 10 @ 80 lbs  SLS 2 x 20 sec each  TKE black band 2 x 10   [facility] Adult PT Treatment:                                                DATE: 01/24/22 Therapeutic Exercise: Recumbent bike level 3 x 5 minutes  Prone quad stretch x 60 sec  Sidelying hip abduction 2 x 10  Mini squat with UE support 2 x 10  Standing march 2 x 10  Calf raise 2 x 15  Updated HEP      PATIENT EDUCATION:  Education details: n/a Person educated: n/a  Education method: n/a Education comprehension: n/a     HOME EXERCISE PROGRAM: Access Code: [email or web address] URL: [email or web address] Date: 01/05/2022 Prepared by: Gwendolyn Grant   Exercises - Seated Long Arc Quad  - 2 x daily - 7 x weekly - 2 sets - 10 reps - Seated Toe Raise  - 2 x daily - 7 x weekly - 2 sets - 10 reps - Long Sitting Calf Stretch with Strap  - 2 x daily - 7 x weekly - 3 sets - 30 sec  hold - Seated Hamstring Stretch  - 2 x daily - 7 x weekly - 3 sets - 30 sec  hold   ASSESSMENT:   CLINICAL IMPRESSION: Patient tolerated session well today focusing primarily on ankle strengthening and static balance. He demonstrates fair postural control with static balance training requiring occasional use of reaching strategy to maintain balance with tandem and single leg stance. No reports of increased pain throughout session.      OBJECTIVE IMPAIRMENTS Abnormal gait,  decreased activity tolerance, decreased balance, decreased knowledge of condition, difficulty walking, decreased ROM, decreased strength, impaired flexibility, improper body mechanics, and pain.    ACTIVITY LIMITATIONS carrying, lifting, bending, standing, squatting, stairs, transfers, and locomotion level   PARTICIPATION LIMITATIONS: meal prep, cleaning, laundry, shopping, community activity, and yard work   PERSONAL FACTORS Age, Time since onset of injury/illness/exacerbation, and 3+ comorbidities: see PMH above   are also affecting patient's functional outcome.    REHAB POTENTIAL: Good   CLINICAL DECISION MAKING: Evolving/moderate complexity   EVALUATION COMPLEXITY: Moderate     GOALS: Goals reviewed with patient? Yes   SHORT TERM GOALS: Target date: 01/26/22 Patient will be independent and compliant with initial HEP.    Baseline: issued at eval  Goal status: met   2.  Patient will demonstrate at least 5 degrees of Lt ankle DF AROM to improve gait mechanics.  Baseline: 0 Goal status: met    3.  Patient will improve Lt hamstring flexibility by at least 15 degrees to reduce stress on the LLE with walking.  Baseline: lacking 55 Goal status: met      LONG TERM GOALS: Target date: 02/17/22   Patient will demonstrate 5/5 Lt knee extensor strength to reduce buckling with walking.  Baseline: see above Goal status: INITIAL   2.  Patient will demonstrate 5/5 Lt ankle DF strength to improve ability to heel strike.  Baseline: see above  Goal  status: INITIAL   3.  Patient will score at least 40/80 on the LEFS to signify clinically meaningful improvement in functional abilities.    Baseline: 27/80 Goal status: INITIAL   4.  Patient will complete 5 x STS </= 13 seconds to improve his functional strength and ease of transfers.  Baseline: see above  Goal status: INITIAL   5.  Patient will ambulate community distances without need for AD.  Baseline:  Goal status: INITIAL      PLAN: PT FREQUENCY: 2x/week   PT DURATION: 6 weeks   PLANNED INTERVENTIONS: Therapeutic exercises, Therapeutic activity, Neuromuscular re-education, Balance training, Gait training, Patient/Family education, Self Care, Manual therapy, and Re-evaluation   PLAN FOR NEXT SESSION: LLE stretching and strengthening; review and update HEP prn; balance training    Gwendolyn Grant, PT, DPT, ATC 02/12/22 4:04 PM

## 2022-02-13 NOTE — Therapy (Incomplete)
OUTPATIENT PHYSICAL THERAPY TREATMENT NOTE PHYSICAL THERAPY DISCHARGE SUMMARY  Visits from Start of Care: 9  Current functional level related to goals / functional outcomes: See goals below    Remaining deficits: ***   Education / Equipment: See education below    Patient agrees to discharge. Patient goals were {OP Goals:25702::"met"}. Patient is being discharged due to {OP Discharge Reasons:25703::"meeting the stated rehab goals."}   Patient Name: Justin [name] MRN: [id number] DOB:Aug 12, 1965, 56 y.o., male Today's Date: 02/13/2022  PCP: [facility] REFERRING PROVIDER: Damita Lack, MD  END OF SESSION:          Past Medical History:  Diagnosis Date   Achilles rupture, left    Bipolar 1 disorder ([place])    Dental caries    periodontitis   Pneumonia    Pulmonary embolism ([place]) 05/18/2007   Sleep apnea    wears CPAP   Subdural hematoma ([place]) 01/04/2013   in setting of supratherapeutic INR   Past Surgical History:  Procedure Laterality Date   ACHILLES TENDON SURGERY Left 10/21/2014   Procedure: Left Achilles Reconstruction;  Surgeon: Newt Minion, MD;  Location: [place];  Service: Orthopedics;  Laterality: Left;   APPENDECTOMY     CARDIAC CATHETERIZATION  03/04/2018   [facility]: Normal coronaries, LVEF estimated at 40%, medical Rx   CRANIOTOMY N/A 01/19/2013   Procedure: CRANIOTOMY HEMATOMA EVACUATION SUBDURAL;  Surgeon: Hosie Spangle, MD;  Location: [place] [facility];  Service: Neurosurgery;  Laterality: N/A;   CYSTECTOMY     right head   ELBOW SURGERY     right   FRACTURE SURGERY     finger   I & D EXTREMITY Left 12/07/2016   Procedure: LEFT ACHILLES DEBRIDEMENT;  Surgeon: Newt Minion, MD;  Location: [place];  Service: Orthopedics;  Laterality: Left;   LUMBAR FUSION  12/23/2017   L5 GILL PROCEDURE, RIGHT L5-S1, TRANSFORAMIAL LUMBAR INTERBODY FUSION, BILATERAL LATERAL FUSION, PEDICLE INSTRUMENTATION   MULTIPLE EXTRACTIONS WITH ALVEOLOPLASTY  N/A 03/07/2015   Procedure: MULTIPLE EXTRACTION WITH ALVEOLOPLASTY;  Surgeon: Diona Browner, DDS;  Location: [place];  Service: Oral Surgery;  Laterality: N/A;   SPINAL CORD STIMULATOR INSERTION N/A 05/18/2019   Procedure: LUMBAR SPINAL CORD STIMULATOR INSERTION;  Surgeon: Clydell Hakim, MD;  Location: [place];  Service: Neurosurgery;  Laterality: N/A;  Thoracic/Lumbar   SPINAL CORD STIMULATOR INSERTION N/A 04/06/2020   Procedure: Revision of spinal cord stimulator;  Surgeon: Reece Agar, MD;  Location: [place];  Service: Neurosurgery;  Laterality: N/A;   Patient Active Problem List   Diagnosis Date Noted   Hematoma of left thigh    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   Leukocytosis 12/22/2021   SIRS (systemic inflammatory response syndrome) ([place]) 12/22/2021   Normocytic anemia 12/22/2021   Hematoma 12/09/2021   Acute deep vein thrombosis (DVT) of right peroneal vein ([place]) 10/19/2021   History of pulmonary embolism 10/19/2021   Status post lumbar spinal fusion 01/03/2018   Lumbar stenosis 12/23/2017   Spondylolisthesis, lumbar region 06/25/2017   Achilles tendinitis of left lower extremity 12/07/2016   Achilles tendinitis, left leg 05/17/2016   Obstructive sleep apnea on CPAP 03/07/2015   Post-operative state 03/07/2015   Achilles rupture, left 10/21/2014   OSA (obstructive sleep apnea) 04/23/2013   Bipolar disorder, unspecified ([place]) 01/07/2013   Sinus tachycardia 01/07/2013   Headache(784.0) 01/07/2013   SDH (subdural hematoma) ([place]) 01/04/2013   H/O PE 01/04/2013   Chronic anticoagulation 01/04/2013   Warfarin-induced coagulopathy ([place]) 01/04/2013   Acute  sinusitis 01/04/2013    REFERRING DIAG: Q76.22QJ (ICD-10-CM) - Hematoma of left thigh, initial encounter  THERAPY DIAG:  No diagnosis found.  Rationale for Evaluation and Treatment Rehabilitation  PERTINENT HISTORY:  Bipolar disorder Pulmonary embolism  Subdural hematoma Lumbar fusion  Spinal cord stimulator   PRECAUTIONS: Fall;  history of DVT/PE on anti-coags  SUBJECTIVE:                                                                                                                                                                                      SUBJECTIVE STATEMENT:  Patient reports a little pain and tenderness about the left lateral lower leg today.    PAIN:  Are you having pain? Yes: NPRS scale: 7/10 Pain location: Lt lateral lower leg Pain description: tender Aggravating factors: palpation Relieving factors: unknown   OBJECTIVE: (objective measures completed at initial evaluation unless otherwise dated)  DIAGNOSTIC FINDINGS:  LLE CT scan: IMPRESSION: 1. No arterial stenosis.  No findings of deep venous thrombosis. 2. Trace calcific plaques in the left internal iliac artery and scattered along the left anterior and posterior tibial arteries. 3. As compared to the study of 12/09/2021, large collection in the vastus lateralis muscle appears more organized and is more complex, with numerous enhancing septations and multiple tiny fluid pockets. The appearance is most suggestive of a pyomyositis although component of hematoma could still be present. No soft tissue gas is seen but there is edema in the anterior compartment deep soft tissue planes which can be seen with fasciitis. 4. Generalized left lower extremity edema. No subcutaneous abscess or soft tissue gas. 5. 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 No overlying cortical subsidence or significant findings of secondary hip DJD. 6. 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 7. Stable 1.7 cm soft tissue nodule in the left ischioanal fossa subcutaneous fat. Nonspecific. Follow-up as indicated. 8. Mildly enlarged lymph nodes in the left external iliac and inguinal chains probably reactive. 9. Mild prostatomegaly.     PATIENT SURVEYS:  LEFS 27/80 02/14/22: LEFS ***   COGNITION:           Overall cognitive status: Within functional limits  for tasks assessed                          SENSATION: WFL   EDEMA:  Diffuse swelling about LLE; discoloration about distal LLE   MUSCLE LENGTH: Hamstrings: lacking 55 Lt; lacking 20 Rt  01/15/22: Lt lacking 35 degrees    POSTURE: No Significant postural limitations   PALPATION: Diffuse tenderness about left lower leg and quad    LOWER EXTREMITY ROM:  Active ROM Right eval Left eval 01/24/22 02/07/22 Left   Hip flexion        Hip extension        Hip abduction        Hip adduction        Hip internal rotation        Hip external rotation        Knee flexion 128 120    Knee extension WNL WNL    Ankle dorsiflexion 8 0 3 5  Ankle plantarflexion        Ankle inversion        Ankle eversion         (Blank rows = not tested)   LOWER EXTREMITY MMT:   MMT Right eval Left eval 02/14/22  Hip flexion 5 5   Hip extension       Hip abduction       Hip adduction       Hip internal rotation       Hip external rotation       Knee flexion 5 5   Knee extension 5 4-   Ankle dorsiflexion 5 3-   Ankle plantarflexion       Ankle inversion       Ankle eversion        (Blank rows = not tested)   LOWER EXTREMITY SPECIAL TESTS:  (+) [place]    FUNCTIONAL TESTS:  TUG 14.5 seconds 5 x STS: 20 seconds  SLS: 6 seconds LLE; 9 seconds RLE    GAIT: Distance walked: 25 ft Assistive device utilized: None Level of assistance: Complete Independence Comments: occasional buckling of the Lt knee; foot flat initial contact.        TODAY'S TREATMENT: [facility] Adult PT Treatment:                                                DATE: 02/12/22 Therapeutic Exercise: Recumbent bike level 3 x 5 minutes  Calf stretch on wedge x 60 seconds Ankle rockerboard A/P 2 x 10  4 way ankle blue band 2 x 10  Calf raise on airex 2 x 10  Tandem balance 3 x 30 sec  SLS x 20 sec each   [facility] Adult PT Treatment:                                                DATE: 02/07/22 Therapeutic Exercise: Recumbent  bike level 3 x 5 minutes  Sit to stand 2 x 10  Cybex hip flexion 2 x 10; 25 lbs  Cybex hip abduction 2 x 10; 25 lbs  SLR 2 x 10   [facility] Adult PT Treatment:                                                DATE: 01/29/22 Therapeutic Exercise: Recumbent bike level 3 x 5 minutes  Prone quad stretch x 60 seconds  Hamstring stretch with strap x 60 seconds  Resisted knee extension 3 x 10@ 20 lbs  Resisted hamstring curl 3 x 10 @ 35 lbs  Leg  press 3 x 10 @ 80 lbs  SLS 2 x 20 sec each  TKE black band 2 x 10   [facility] Adult PT Treatment:                                                DATE: 01/24/22 Therapeutic Exercise: Recumbent bike level 3 x 5 minutes  Prone quad stretch x 60 sec  Sidelying hip abduction 2 x 10  Mini squat with UE support 2 x 10  Standing march 2 x 10  Calf raise 2 x 15  Updated HEP      PATIENT EDUCATION:  Education details: n/a Person educated: n/a  Education method: n/a Education comprehension: n/a     HOME EXERCISE PROGRAM: Access Code: [email or web address] URL: [email or web address] Date: 01/05/2022 Prepared by: Gwendolyn Grant   Exercises - Seated Long Arc Quad  - 2 x daily - 7 x weekly - 2 sets - 10 reps - Seated Toe Raise  - 2 x daily - 7 x weekly - 2 sets - 10 reps - Long Sitting Calf Stretch with Strap  - 2 x daily - 7 x weekly - 3 sets - 30 sec  hold - Seated Hamstring Stretch  - 2 x daily - 7 x weekly - 3 sets - 30 sec  hold   ASSESSMENT:   CLINICAL IMPRESSION: Patient tolerated session well today focusing primarily on ankle strengthening and static balance. He demonstrates fair postural control with static balance training requiring occasional use of reaching strategy to maintain balance with tandem and single leg stance. No reports of increased pain throughout session.      OBJECTIVE IMPAIRMENTS Abnormal gait, decreased activity tolerance, decreased balance, decreased knowledge of condition, difficulty walking, decreased ROM, decreased  strength, impaired flexibility, improper body mechanics, and pain.    ACTIVITY LIMITATIONS carrying, lifting, bending, standing, squatting, stairs, transfers, and locomotion level   PARTICIPATION LIMITATIONS: meal prep, cleaning, laundry, shopping, community activity, and yard work   PERSONAL FACTORS Age, Time since onset of injury/illness/exacerbation, and 3+ comorbidities: see PMH above   are also affecting patient's functional outcome.    REHAB POTENTIAL: Good   CLINICAL DECISION MAKING: Evolving/moderate complexity   EVALUATION COMPLEXITY: Moderate     GOALS: Goals reviewed with patient? Yes   SHORT TERM GOALS: Target date: 01/26/22 Patient will be independent and compliant with initial HEP.    Baseline: issued at eval  Goal status: met   2.  Patient will demonstrate at least 5 degrees of Lt ankle DF AROM to improve gait mechanics.  Baseline: 0 Goal status: met    3.  Patient will improve Lt hamstring flexibility by at least 15 degrees to reduce stress on the LLE with walking.  Baseline: lacking 55 Goal status: met      LONG TERM GOALS: Target date: 02/17/22   Patient will demonstrate 5/5 Lt knee extensor strength to reduce buckling with walking.  Baseline: see above Goal status: INITIAL   2.  Patient will demonstrate 5/5 Lt ankle DF strength to improve ability to heel strike.  Baseline: see above  Goal status: INITIAL   3.  Patient will score at least 40/80 on the LEFS to signify clinically meaningful improvement in functional abilities.    Baseline: 27/80 Goal status: INITIAL   4.  Patient will complete 5 x STS </= 13 seconds to  improve his functional strength and ease of transfers.  Baseline: see above  Goal status: INITIAL   5.  Patient will ambulate community distances without need for AD.  Baseline:  Goal status: met     PLAN: PT FREQUENCY: 2x/week   PT DURATION: 6 weeks   PLANNED INTERVENTIONS: Therapeutic exercises, Therapeutic activity,  Neuromuscular re-education, Balance training, Gait training, Patient/Family education, Self Care, Manual therapy, and Re-evaluation   PLAN FOR NEXT SESSION: LLE stretching and strengthening; review and update HEP prn; balance training    Gwendolyn Grant, PT, DPT, ATC 02/13/22 1:34 PM

## 2022-02-14 ENCOUNTER — Ambulatory Visit: Payer: Medicaid Other

## 2022-02-16 ENCOUNTER — Ambulatory Visit (INDEPENDENT_AMBULATORY_CARE_PROVIDER_SITE_OTHER): Payer: Medicaid Other | Admitting: Internal Medicine

## 2022-02-16 ENCOUNTER — Encounter: Payer: Self-pay | Admitting: Internal Medicine

## 2022-02-16 VITALS — BP 140/88 | HR 99 | Ht 69.0 in

## 2022-02-16 DIAGNOSIS — E0590 Thyrotoxicosis, unspecified without thyrotoxic crisis or storm: Secondary | ICD-10-CM

## 2022-02-16 NOTE — Progress Notes (Unsigned)
Name: Justin [name]  MRN/ DOB: [id number], 29-May-1965    Age/ Sex: 56 y.o., male    PCP: [facility]   Reason for Endocrinology Evaluation: Low TSH      Date of Initial Endocrinology Evaluation: 02/16/2022     HPI: [name] is a 56 y.o. male with a past medical history of Hx of PE/DVT, Bipolar . The patient presented for initial endocrinology clinic visit on 02/16/2022 for consultative assistance with his low TSH .   Pt has been noted with low TSH during routine labs in 06/2021. Of note, the pt on Lithium  for year.    He is accompanied by his son   He was noted with unintentional weight loss ~30 lbs  Appetite stable  Has occasional palpitations  Has intermittent hoarseness  Local neck swelling  Denies loose stools or diarrhea  No XRT  Has GERD Follows with a local psychiatrist  No Fh of thyroid disease       HISTORY:  Past Medical History:  Past Medical History:  Diagnosis Date   Achilles rupture, left    Bipolar 1 disorder ([facility])    Dental caries    periodontitis   Pneumonia    Pulmonary embolism ([facility]) 05/18/2007   Sleep apnea    wears CPAP   Subdural hematoma ([facility]) 01/04/2013   in setting of supratherapeutic INR   Past Surgical History:  Past Surgical History:  Procedure Laterality Date   ACHILLES TENDON SURGERY Left 10/21/2014   Procedure: Left Achilles Reconstruction;  Surgeon: Nadara Mustard, MD;  [facility];  Service: Orthopedics;  Laterality: Left;   APPENDECTOMY     CARDIAC CATHETERIZATION  03/04/2018   [facility]: Normal coronaries, LVEF estimated at 40%, medical Rx   CRANIOTOMY N/A 01/19/2013   Procedure: CRANIOTOMY HEMATOMA EVACUATION SUBDURAL;  Surgeon: Hewitt Shorts, MD;  Location: [facility];  Service: Neurosurgery;  Laterality: N/A;   CYSTECTOMY     right head   ELBOW SURGERY     right   FRACTURE SURGERY     finger   I & D EXTREMITY Left 12/07/2016   Procedure: LEFT ACHILLES DEBRIDEMENT;  Surgeon: Nadara Mustard, MD;  Location: [facility] OR;  Service: Orthopedics;  Laterality: Left;   LUMBAR FUSION  12/23/2017   L5 GILL PROCEDURE, RIGHT L5-S1, TRANSFORAMIAL LUMBAR INTERBODY FUSION, BILATERAL LATERAL FUSION, PEDICLE INSTRUMENTATION   MULTIPLE EXTRACTIONS WITH ALVEOLOPLASTY N/A 03/07/2015   Procedure: MULTIPLE EXTRACTION WITH ALVEOLOPLASTY;  Surgeon: [name], DDS;  Location[facility];  Service: Oral Surgery;  Laterality: N/A;   SPINAL CORD STIMULATOR INSERTION N/A 05/18/2019   Procedure: LUMBAR SPINAL CORD STIMULATOR INSERTION;  Surgeon: Odette Fraction, MD;  Location: [facility];  Service: Neurosurgery;  Laterality: N/A;  Thoracic/Lumbar   SPINAL CORD STIMULATOR INSERTION N/A 04/06/2020   Procedure: Revision of spinal cord stimulator;  Surgeon: Renaldo Fiddler, MD;  Location: [facility];  Service: Neurosurgery;  Laterality: N/A;    Social History:  reports that he has never smoked. He has never used smokeless tobacco. He reports that he does not drink alcohol and does not use drugs. Family History: family history includes Down syndrome in his son; Hypertension in his mother; Multiple sclerosis in his sister; Stroke in his mother.   HOME MEDICATIONS: Allergies as of 02/16/2022       Reactions   Lyrica [pregabalin] Other (See Comments)   Hallucinations        Medication List  Accurate as of February 16, 2022  4:14 PM. If you have any questions, ask your nurse or doctor.          dabigatran 150 MG Caps capsule Commonly known as: Pradaxa Take 1 capsule (150 mg total) by mouth 2 (two) times daily.   diclofenac Sodium 1 % Gel Commonly known as: VOLTAREN Apply 4 g topically 4 (four) times daily as needed (pain).   doxepin 75 MG capsule Commonly known as: SINEQUAN Take 75 mg by mouth at bedtime.   enoxaparin 100 MG/ML injection Commonly known as: Lovenox Inject 1 mL (100 mg total) into the skin every 12 (twelve) hours for 14 days.   gabapentin 300 MG capsule Commonly known as:  NEURONTIN Take 1 capsule (300 mg total) by mouth 3 (three) times daily for 14 days.   HYDROcodone-acetaminophen 7.5-325 MG tablet Commonly known as: NORCO Take 0.5-1 tablets by mouth every 8 (eight) hours as needed for moderate pain or severe pain.   lithium 600 MG capsule Take 600 mg by mouth at bedtime.   mirtazapine 45 MG tablet Commonly known as: REMERON Take 45 mg by mouth at bedtime.   QUEtiapine 400 MG 24 hr tablet Commonly known as: SEROQUEL XR Take 800 mg by mouth at bedtime.   tiZANidine 4 MG tablet Commonly known as: ZANAFLEX Take 4 mg by mouth at bedtime.          REVIEW OF SYSTEMS: A comprehensive ROS was conducted with the patient and is negative except as per HPI     OBJECTIVE:  VS: BP (!) 140/88 (BP Location: Left Arm, Patient Position: Sitting, Cuff Size: Normal)   Pulse 99   Ht 5\' 9"  (1.753 m)   SpO2 92%   BMI 33.55 kg/m    Wt Readings from Last 3 Encounters:  01/19/22 227 lb 3.2 oz (103.1 kg)  12/21/21 220 lb 0.3 oz (99.8 kg)  12/10/21 220 lb (99.8 kg)     EXAM: General: Pt appears well and is in NAD  Eyes: External eye exam normal without stare, lid lag or exophthalmos.  EOM intact.  PERRL.  Neck: General: Supple without adenopathy. Thyroid: Thyroid size normal.  No goiter or nodules appreciated. No thyroid bruit.  Lungs: Clear with good BS bilat with no rales, rhonchi, or wheezes  Heart: Auscultation: RRR.  Abdomen: Normoactive bowel sounds, soft, nontender, without masses or organomegaly palpable  Extremities:  BL LE: No pretibial edema normal ROM and strength.  Mental Status: Judgment, insight: Intact Orientation: Oriented to time, place, and person Mood and affect: No depression, anxiety, or agitation     DATA REVIEWED: ***    ASSESSMENT/PLAN/RECOMMENDATIONS:   Subclinical Hyperthyroidism:  -We discussed differential diagnosis of Graves' disease, versus lithium  Medications :  Signed electronically by: 12/12/21, MD  [facility]  [facility] [address] [place] [place], [place] Phone: [phone] FAX: [phone]   CC: [phone] Clinics [address] [place] Phone: [phone] Fax: [phone]   Return to Endocrinology clinic as below: Future Appointments  Date Time Provider Department Center  02/26/2022 11:00 AM 14/01/2022, MD [facility]  02/27/2022  3:30 PM 14/02/2022, PT [facility]  04/20/2022  2:45 PM [facility] None  04/20/2022  3:20 PM 06/19/2022, MD [facility] None

## 2022-02-20 LAB — T4, FREE: Free T4: 0.6 ng/dL — ABNORMAL LOW (ref 0.8–1.8)

## 2022-02-20 LAB — TSH: TSH: 0.52 mIU/L (ref 0.40–4.50)

## 2022-02-20 LAB — TRAB (TSH RECEPTOR BINDING ANTIBODY): TRAB: <1 IU/L (ref <=2.00)

## 2022-02-25 NOTE — Progress Notes (Addendum)
Cardiology Office Note:    Date:  02/26/2022   ID:  Justin [name], DOB 01-Aug-1965, MRN [id number]  PCP:  [facility]  Cardiologist:  None   Referring MD: [facility]   Chief Complaint  Patient presents with   Coronary Artery Disease    Coronary calcium noted on noncardiac CT   Chest Pain   Congestive Heart Failure   Follow-up    Prior PE    History of Present Illness:    Justin [name] is a 56 y.o. male with a hx of OSA not using CPAP, prior pulmonary embolism, chronic anticoagulation who is referred for chest pain.  [facility] : "Anterior chest and had been to outside hospital but not been treated. Troponin mildly elevated at that time. Pain comes and goes. Anterior chest. Somewhat worse with breathing".  Two week history of left parasternal pleuritic chest discomfort.  Discomfort is made worse with taking a deep breath.  The discomfort is made worse with lying flat.  For several months he is experienced dyspnea on exertion.  A recent emergency room visit led to a chest CT with contrast which demonstrated cardiac enlargement and coronary calcification.  Cardiovascular risk factors include labile blood pressure, family history of CVA (mother), elevated cholesterol.  He is not diabetic and does not smoke.  Past Medical History:  Diagnosis Date   Achilles rupture, left    Bipolar 1 disorder (HCC)    Dental caries    periodontitis   Pneumonia    Pulmonary embolism (HCC) 05/18/2007   Sleep apnea    wears CPAP   Subdural hematoma (HCC) 01/04/2013   in setting of supratherapeutic INR    Past Surgical History:  Procedure Laterality Date   ACHILLES TENDON SURGERY Left 10/21/2014   Procedure: Left Achilles Reconstruction;  Surgeon: Nadara Mustard, MD;  Location: [facility] OR;  Service: Orthopedics;  Laterality: Left;   APPENDECTOMY     CARDIAC CATHETERIZATION  03/04/2018   [facility]: Normal coronaries, LVEF estimated at 40%, medical Rx   CRANIOTOMY N/A  01/19/2013   Procedure: CRANIOTOMY HEMATOMA EVACUATION SUBDURAL;  Surgeon: Hewitt Shorts, MD;  Location: [facility] NEURO ORS;  Service: Neurosurgery;  Laterality: N/A;   CYSTECTOMY     right head   ELBOW SURGERY     right   FRACTURE SURGERY     finger   I & D EXTREMITY Left 12/07/2016   Procedure: LEFT ACHILLES DEBRIDEMENT;  Surgeon: Nadara Mustard, MD;  Location: [facility] OR;  Service: Orthopedics;  Laterality: Left;   LUMBAR FUSION  12/23/2017   L5 GILL PROCEDURE, RIGHT L5-S1, TRANSFORAMIAL LUMBAR INTERBODY FUSION, BILATERAL LATERAL FUSION, PEDICLE INSTRUMENTATION   MULTIPLE EXTRACTIONS WITH ALVEOLOPLASTY N/A 03/07/2015   Procedure: MULTIPLE EXTRACTION WITH ALVEOLOPLASTY;  Surgeon: [name], DDS;  Location: [facility] OR;  Service: Oral Surgery;  Laterality: N/A;   SPINAL CORD STIMULATOR INSERTION N/A 05/18/2019   Procedure: LUMBAR SPINAL CORD STIMULATOR INSERTION;  Surgeon: Odette Fraction, MD;  Location: [facility] OR;  Service: Neurosurgery;  Laterality: N/A;  Thoracic/Lumbar   SPINAL CORD STIMULATOR INSERTION N/A 04/06/2020   Procedure: Revision of spinal cord stimulator;  Surgeon: Renaldo Fiddler, MD;  Location: [facility] OR;  Service: Neurosurgery;  Laterality: N/A;    Current Medications: Current Meds  Medication Sig   dabigatran (PRADAXA) 150 MG CAPS capsule Take 1 capsule (150 mg total) by mouth 2 (two) times daily.   diclofenac Sodium (VOLTAREN) 1 % GEL Apply 4 g topically 4 (four) times  daily as needed (pain).   doxepin (SINEQUAN) 75 MG capsule Take 75 mg by mouth at bedtime.   HYDROcodone-acetaminophen (NORCO) 7.5-325 MG tablet Take 0.5-1 tablets by mouth every 8 (eight) hours as needed for moderate pain or severe pain.   lithium 600 MG capsule Take 600 mg by mouth at bedtime.   metoprolol tartrate (LOPRESSOR) 100 MG tablet Take 1 tablet (100 mg total) by mouth once for 1 dose. Take 90-120 minutes prior to scan.   mirtazapine (REMERON) 45 MG tablet Take 45 mg by mouth at bedtime.   QUEtiapine (SEROQUEL XR)  400 MG 24 hr tablet Take 800 mg by mouth at bedtime.   tiZANidine (ZANAFLEX) 4 MG tablet Take 4 mg by mouth at bedtime.      Allergies:   Lyrica [pregabalin]   Social History   Socioeconomic History   Marital status: Single    Spouse name: Not on file   Number of children: Not on file   Years of education: Not on file   Highest education level: Not on file  Occupational History   Occupation: disability  Tobacco Use   Smoking status: Never   Smokeless tobacco: Never  Vaping Use   Vaping Use: Never used  Substance and Sexual Activity   Alcohol use: No   Drug use: No   Sexual activity: Not on file  Other Topics Concern   Not on file  Social History Narrative   FROM [place]    Lives in a 2 story apartment.  His cousin is currently staying with him.  Has 4 children.  On disability for the past 10 years for pulmonary embolism, bipolar disease.  Education: high school.   Social Determinants of Health   Financial Resource Strain: Not on file  Food Insecurity: No Food Insecurity (12/22/2021)   Hunger Vital Sign    Worried About Running Out of Food in the Last Year: Never true    Ran Out of Food in the Last Year: Never true  Transportation Needs: No Transportation Needs (12/22/2021)   PRAPARE - Administrator, Civil Service (Medical): No    Lack of Transportation (Non-Medical): No  Physical Activity: Not on file  Stress: Not on file  Social Connections: Not on file     Family History: The patient's family history includes Down syndrome in his son; Hypertension in his mother; Multiple sclerosis in his sister; Stroke in his mother.  ROS:   Please see the history of present illness.    He has a son with a learning disability.  He does not know if he snores.  He says he sleeps comfortably.  All other systems reviewed and are negative.  EKGs/Labs/Other Studies Reviewed:    The following studies were reviewed today: CT Angio Chest 10/12/2021: Cardiovascular:  Good opacification of the central and segmental pulmonary arteries. No focal filling defects. No evidence of significant pulmonary embolus. Cardiac enlargement. No pericardial effusions. Normal caliber thoracic aorta. Mild aortic and coronary artery calcification.   EKG:  EKG performed 02/06/2022 demonstrated with prominent voltage consistent with LVH, left anterior hemiblock, left atrial abnormality.  Recent Labs: 12/08/2021: B Natriuretic Peptide 23.4 12/24/2021: Magnesium 2.1 01/19/2022: ALT 15 02/03/2022: BUN 5; Creatinine, Ser 1.17; Hemoglobin 13.3; Platelets 255; Potassium 3.5; Sodium 142 02/16/2022: TSH 0.52  Recent Lipid Panel    Component Value Date/Time   CHOL 220 (H) 10/11/2021 0000   TRIG 224 (H) 10/11/2021 0000   HDL 57 10/11/2021 0000   CHOLHDL 3.9 10/11/2021  0000   LDLCALC 127 (H) 10/11/2021 0000    Physical Exam:    VS:  BP 108/74   Pulse 88   Ht  (1.753 m)   Wt 219 lb 6.4 oz (99.5 kg)   SpO2 98%   BMI 32.40 kg/m     Wt Readings from Last 3 Encounters:  02/26/22 219 lb 6.4 oz (99.5 kg)  01/19/22 227 lb 3.2 oz (103.1 kg)  12/21/21 220 lb 0.3 oz (99.8 kg)     GEN: Has lost 30 pounds in the last 2 to 3 months.. No acute distress HEENT: Normal NECK: No JVD. LYMPHATICS: No lymphadenopathy CARDIAC: No murmur. RRR S4 but no S3 gallop, or edema. VASCULAR:  Normal Pulses. No bruits. RESPIRATORY:  Clear to auscultation without rales, wheezing or rhonchi  ABDOMEN: Soft, non-tender, non-distended, No pulsatile mass, MUSCULOSKELETAL: No deformity  SKIN: Warm and dry NEUROLOGIC:  Alert and oriented x 3 PSYCHIATRIC:  Normal affect   ASSESSMENT:    1. Chest pain of uncertain etiology   2. Pulmonary embolism, unspecified chronicity, unspecified pulmonary embolism type, unspecified whether acute cor pulmonale present (HCC)   3. Chronic anticoagulation   4. OSA (obstructive sleep apnea)   5. SDH (subdural hematoma) (HCC)   6. DOE (dyspnea on exertion)   7.  Chest pain, unspecified type   8. Diabetes mellitus screening declined by patient    PLAN:    In order of problems listed above:  Needs echocardiogram because of cardiomegaly noted on CT. Needs ischemic eval for CAD with Cor CTA.  Echo will help assess right heart given history of PE. Continue Pradaxa therapy. He states that sleep apnea diagnosed remotely before he lost weight.  The most recent sleep study revealed moderate obstructive sleep apnea 2015.  Not currently using CPAP. Did not discuss Diastolic versus systolic heart failure.  Echo will help. Based on heart size by CT, he will have a component of systolic heart failure.  Further management and evaluation will be dependent upon findings of the CT and coronary CTA.   Medication Adjustments/Labs and Tests Ordered: Current medicines are reviewed at length with the patient today.  Concerns regarding medicines are outlined above.  Orders Placed This Encounter  Procedures   CT CORONARY MORPH W/CTA COR W/SCORE W/CA W/CM &/OR WO/CM   Basic metabolic panel   Pro b natriuretic peptide (BNP)   High sensitivity CRP   Hemoglobin A1c   ECHOCARDIOGRAM COMPLETE   Meds ordered this encounter  Medications   metoprolol tartrate (LOPRESSOR) 100 MG tablet    Sig: Take 1 tablet (100 mg total) by mouth once for 1 dose. Take 90-120 minutes prior to scan.    Dispense:  1 tablet    Refill:  0    Patient Instructions  Medication Instructions:  Your physician recommends that you continue on your current medications as directed. Please refer to the Current Medication list given to you today.  *If you need a refill on your cardiac medications before your next appointment, please call your pharmacy*  Lab Work: TODAY: BMET, BNP, hs-CRP, Hgb A1c If you have labs (blood work) drawn today and your tests are completely normal, you will receive your results only by: MyChart Message (if you have MyChart) OR A paper copy in the mail If you have any  lab test that is abnormal or we need to change your treatment, we will call you to review the results.  Testing/Procedures: Your physician has requested that you have a coronary  CTA performed.  Your physician has requested that you have an echocardiogram. Echocardiography is a painless test that uses sound waves to create images of your heart. It provides your doctor with information about the size and shape of your heart and how well your heart's chambers and valves are working. This procedure takes approximately one hour. There are no restrictions for this procedure. Please do NOT wear cologne, perfume, aftershave, or lotions (deodorant is allowed). Please arrive 15 minutes prior to your appointment time.  Follow-Up: At [facility], you and your health needs are our priority.  As part of our continuing mission to provide you with exceptional heart care, we have created designated Provider Care Teams.  These Care Teams include your primary Cardiologist (physician) and Advanced Practice Providers (APPs -  Physician Assistants and Nurse Practitioners) who all work together to provide you with the care you need, when you need it.  Your next appointment:   1 month(s)  The format for your next appointment:   In Person  Provider:   Jari Favre, PA-C, Ronie Spies, PA-C, Robin Searing, NP, Eligha Bridegroom, NP, or Tereso Newcomer, PA-C  Other Instructions   Your cardiac CT will be scheduled at:   [facility] [address] [place], [place] [address] [phone]  Please arrive at the [facility] and Children's Entrance (Entrance C2) of [facility] 30 minutes prior to test start time. You can use the FREE valet parking offered at entrance C (encouraged to control the heart rate for the test)  Proceed to the [facility] Radiology Department (first floor) to check-in and test prep.  All radiology patients and guests should use entrance C2 at [facility], accessed  from [facility], even though the hospital's physical address listed is [address]    Please follow these instructions carefully (unless otherwise directed):  Hold all erectile dysfunction medications at least 3 days (72 hrs) prior to test. (Ie viagra, cialis, sildenafil, tadalafil, etc) We will administer nitroglycerin during this exam.   On the Night Before the Test: Be sure to Drink plenty of water. Do not consume any caffeinated/decaffeinated beverages or chocolate 12 hours prior to your test. Do not take any antihistamines 12 hours prior to your test.  On the Day of the Test: Drink plenty of water until 1 hour prior to the test. Do not eat any food 1 hour prior to test. You may take your regular medications prior to the test.  Take metoprolol (Lopressor) 100mg  two hours prior to test.      After the Test: Drink plenty of water. After receiving IV contrast, you may experience a mild flushed feeling. This is normal. On occasion, you may experience a mild rash up to 24 hours after the test. This is not dangerous. If this occurs, you can take Benadryl 25 mg and increase your fluid intake. If you experience trouble breathing, this can be serious. If it is severe call 911 IMMEDIATELY. If it is mild, please call our office. If you take any of these medications: Glipizide/Metformin, Avandament, Glucavance, please do not take 48 hours after completing test unless otherwise instructed.  We will call to schedule your test 2-4 weeks out understanding that some insurance companies will need an authorization prior to the service being performed.   For non-scheduling related questions, please contact the cardiac imaging nurse navigator should you have any questions/concerns: , Cardiac Imaging Nurse Navigator Rockwell Alexandria, Cardiac Imaging Nurse Navigator [place]  [facility] Direct Office Dial: [phone]   For scheduling needs,  including cancellations and rescheduling, please call [place], [phone].   Important Information About Sugar         Signed, Lesleigh Noe, MD  02/26/2022 12:52 PM    [facility]

## 2022-02-26 ENCOUNTER — Ambulatory Visit: Payer: Medicaid Other | Attending: Interventional Cardiology | Admitting: Interventional Cardiology

## 2022-02-26 ENCOUNTER — Encounter: Payer: Self-pay | Admitting: Interventional Cardiology

## 2022-02-26 VITALS — BP 108/74 | HR 88 | Ht 69.0 in | Wt 219.4 lb

## 2022-02-26 DIAGNOSIS — G4733 Obstructive sleep apnea (adult) (pediatric): Secondary | ICD-10-CM | POA: Diagnosis not present

## 2022-02-26 DIAGNOSIS — S065XAA Traumatic subdural hemorrhage with loss of consciousness status unknown, initial encounter: Secondary | ICD-10-CM | POA: Diagnosis present

## 2022-02-26 DIAGNOSIS — I2699 Other pulmonary embolism without acute cor pulmonale: Secondary | ICD-10-CM | POA: Insufficient documentation

## 2022-02-26 DIAGNOSIS — Z5320 Procedure and treatment not carried out because of patient's decision for unspecified reasons: Secondary | ICD-10-CM | POA: Insufficient documentation

## 2022-02-26 DIAGNOSIS — Z7901 Long term (current) use of anticoagulants: Secondary | ICD-10-CM | POA: Diagnosis not present

## 2022-02-26 DIAGNOSIS — R0609 Other forms of dyspnea: Secondary | ICD-10-CM | POA: Diagnosis present

## 2022-02-26 DIAGNOSIS — R079 Chest pain, unspecified: Secondary | ICD-10-CM | POA: Diagnosis not present

## 2022-02-26 DIAGNOSIS — S065XAS Traumatic subdural hemorrhage with loss of consciousness status unknown, sequela: Secondary | ICD-10-CM

## 2022-02-26 MED ORDER — METOPROLOL TARTRATE 100 MG PO TABS: 100.0000 mg | ORAL_TABLET | Freq: Once | ORAL | 0 refills | Status: DC

## 2022-02-26 NOTE — Patient Instructions (Addendum)
Medication Instructions:  Your physician recommends that you continue on your current medications as directed. Please refer to the Current Medication list given to you today.  *If you need a refill on your cardiac medications before your next appointment, please call your pharmacy*  Lab Work: TODAY: BMET, BNP, hs-CRP, Hgb A1c If you have labs (blood work) drawn today and your tests are completely normal, you will receive your results only by: MyChart Message (if you have MyChart) OR A paper copy in the mail If you have any lab test that is abnormal or we need to change your treatment, we will call you to review the results.  Testing/Procedures: Your physician has requested that you have a coronary CTA performed.  Your physician has requested that you have an echocardiogram. Echocardiography is a painless test that uses sound waves to create images of your heart. It provides your doctor with information about the size and shape of your heart and how well your heart's chambers and valves are working. This procedure takes approximately one hour. There are no restrictions for this procedure. Please do NOT wear cologne, perfume, aftershave, or lotions (deodorant is allowed). Please arrive 15 minutes prior to your appointment time.  Follow-Up: At [facility], you and your health needs are our priority.  As part of our continuing mission to provide you with exceptional heart care, we have created designated Provider Care Teams.  These Care Teams include your primary Cardiologist (physician) and Advanced Practice Providers (APPs -  Physician Assistants and Nurse Practitioners) who all work together to provide you with the care you need, when you need it.  Your next appointment:   1 month(s)  The format for your next appointment:   In Person  Provider:   Jari Favre, PA-C, Ronie Spies, PA-C, Robin Searing, NP, Eligha Bridegroom, NP, or Tereso Newcomer, PA-C  Other Instructions   Your  cardiac CT will be scheduled at:   [facility] [address] [place], [place] [address] [phone]  Please arrive at the [facility] and Children's Entrance (Entrance C2) of [facility] 30 minutes prior to test start time. You can use the FREE valet parking offered at entrance C (encouraged to control the heart rate for the test)  Proceed to the [facility] Radiology Department (first floor) to check-in and test prep.  All radiology patients and guests should use entrance C2 at [facility], accessed from [facility], even though the hospital's physical address listed is [address].    Please follow these instructions carefully (unless otherwise directed):  Hold all erectile dysfunction medications at least 3 days (72 hrs) prior to test. (Ie viagra, cialis, sildenafil, tadalafil, etc) We will administer nitroglycerin during this exam.   On the Night Before the Test: Be sure to Drink plenty of water. Do not consume any caffeinated/decaffeinated beverages or chocolate 12 hours prior to your test. Do not take any antihistamines 12 hours prior to your test.  On the Day of the Test: Drink plenty of water until 1 hour prior to the test. Do not eat any food 1 hour prior to test. You may take your regular medications prior to the test.  Take metoprolol (Lopressor) 100mg  two hours prior to test.      After the Test: Drink plenty of water. After receiving IV contrast, you may experience a mild flushed feeling. This is normal. On occasion, you may experience a mild rash up to 24 hours after the  test. This is not dangerous. If this occurs, you can take Benadryl 25 mg and increase your fluid intake. If you experience trouble breathing, this can be serious. If it is severe call 911 IMMEDIATELY. If it is mild, please call our office. If you take any of these medications: Glipizide/Metformin, Avandament, Glucavance, please do not take 48 hours  after completing test unless otherwise instructed.  We will call to schedule your test 2-4 weeks out understanding that some insurance companies will need an authorization prior to the service being performed.   For non-scheduling related questions, please contact the cardiac imaging nurse navigator should you have any questions/concerns: Rockwell Alexandria, Cardiac Imaging Nurse Navigator Larey Brick, Cardiac Imaging Nurse Navigator [facility] Direct Office Dial: [phone]   For scheduling needs, including cancellations and rescheduling, please call [place], [phone].   Important Information About Sugar

## 2022-02-27 ENCOUNTER — Ambulatory Visit: Payer: Commercial Managed Care - HMO | Attending: Internal Medicine

## 2022-02-27 DIAGNOSIS — R2689 Other abnormalities of gait and mobility: Secondary | ICD-10-CM | POA: Diagnosis present

## 2022-02-27 DIAGNOSIS — M79605 Pain in left leg: Secondary | ICD-10-CM | POA: Insufficient documentation

## 2022-02-27 DIAGNOSIS — M6281 Muscle weakness (generalized): Secondary | ICD-10-CM | POA: Insufficient documentation

## 2022-02-27 LAB — BASIC METABOLIC PANEL
BUN/Creatinine Ratio: 4 — ABNORMAL LOW (ref 9–20)
BUN: 4 mg/dL — ABNORMAL LOW (ref 6–24)
CO2: 24 mmol/L (ref 20–29)
Calcium: 9.3 mg/dL (ref 8.7–10.2)
Chloride: 108 mmol/L — ABNORMAL HIGH (ref 96–106)
Creatinine, Ser: 1.13 mg/dL (ref 0.76–1.27)
Glucose: 94 mg/dL (ref 70–99)
Potassium: 4.4 mmol/L (ref 3.5–5.2)
Sodium: 145 mmol/L — ABNORMAL HIGH (ref 134–144)
eGFR: 75 mL/min/{1.73_m2} (ref >59)

## 2022-02-27 LAB — HIGH SENSITIVITY CRP: CRP, High Sensitivity: 39.07 mg/L — ABNORMAL HIGH (ref 0.00–3.00)

## 2022-02-27 LAB — HEMOGLOBIN A1C
Est. average glucose Bld gHb Est-mCnc: 137 mg/dL
Hgb A1c MFr Bld: 6.4 % — ABNORMAL HIGH (ref 4.8–5.6)

## 2022-02-27 LAB — PRO B NATRIURETIC PEPTIDE: NT-Pro BNP: 547 pg/mL — ABNORMAL HIGH (ref 0–210)

## 2022-02-27 NOTE — Therapy (Signed)
OUTPATIENT PHYSICAL THERAPY TREATMENT NOTE/RE-CERTIFICATION PHYSICAL THERAPY DISCHARGE SUMMARY  Visits from Start of Care: 9  Current functional level related to goals / functional outcomes: See goals below    Remaining deficits: N/A   Education / Equipment: See education below    Patient agrees to discharge. Patient goals were met. Patient is being discharged due to meeting the stated rehab goals.   Patient Name: Justin [name] MRN: [id number] DOB:12/22/1965, 56 y.o., male Today's Date: 02/27/2022  PCP: [facility] REFERRING PROVIDER: Damita Lack, MD  END OF SESSION:   PT End of Session - 02/27/22 1520     Visit Number 9    Number of Visits 13    Date for PT Re-Evaluation --   n/a d/c   Authorization Type MCD- out of auth; only billing re-evaluation    Authorization Time Period 10/24-12/4/23    Authorization - Number of Visits 12    PT Start Time 1520    PT Stop Time 1545    PT Time Calculation (min) 25 min    Activity Tolerance Patient tolerated treatment well    Behavior During Therapy [facility] for tasks assessed/performed                   Past Medical History:  Diagnosis Date   Achilles rupture, left    Bipolar 1 disorder ([place])    Dental caries    periodontitis   Pneumonia    Pulmonary embolism ([place]) 05/18/2007   Sleep apnea    wears CPAP   Subdural hematoma ([place]) 01/04/2013   in setting of supratherapeutic INR   Past Surgical History:  Procedure Laterality Date   ACHILLES TENDON SURGERY Left 10/21/2014   Procedure: Left Achilles Reconstruction;  Surgeon: Newt Minion, MD;  Location: [place];  Service: Orthopedics;  Laterality: Left;   APPENDECTOMY     CARDIAC CATHETERIZATION  03/04/2018   [facility]: Normal coronaries, LVEF estimated at 40%, medical Rx   CRANIOTOMY N/A 01/19/2013   Procedure: CRANIOTOMY HEMATOMA EVACUATION SUBDURAL;  Surgeon: Hosie Spangle, MD;  Location: [place] [facility];  Service: Neurosurgery;  Laterality:  N/A;   CYSTECTOMY     right head   ELBOW SURGERY     right   FRACTURE SURGERY     finger   I & D EXTREMITY Left 12/07/2016   Procedure: LEFT ACHILLES DEBRIDEMENT;  Surgeon: Newt Minion, MD;  Location: [place];  Service: Orthopedics;  Laterality: Left;   LUMBAR FUSION  12/23/2017   L5 GILL PROCEDURE, RIGHT L5-S1, TRANSFORAMIAL LUMBAR INTERBODY FUSION, BILATERAL LATERAL FUSION, PEDICLE INSTRUMENTATION   MULTIPLE EXTRACTIONS WITH ALVEOLOPLASTY N/A 03/07/2015   Procedure: MULTIPLE EXTRACTION WITH ALVEOLOPLASTY;  Surgeon: Diona Browner, DDS;  Location: [place];  Service: Oral Surgery;  Laterality: N/A;   SPINAL CORD STIMULATOR INSERTION N/A 05/18/2019   Procedure: LUMBAR SPINAL CORD STIMULATOR INSERTION;  Surgeon: Clydell Hakim, MD;  Location: [place];  Service: Neurosurgery;  Laterality: N/A;  Thoracic/Lumbar   SPINAL CORD STIMULATOR INSERTION N/A 04/06/2020   Procedure: Revision of spinal cord stimulator;  Surgeon: Reece Agar, MD;  Location: [place];  Service: Neurosurgery;  Laterality: N/A;   Patient Active Problem List   Diagnosis Date Noted   Hematoma of left thigh    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   Leukocytosis 12/22/2021   SIRS (systemic inflammatory response syndrome) (HCC) 12/22/2021   Normocytic anemia 12/22/2021   Hematoma 12/09/2021   Acute deep vein thrombosis (DVT) of right peroneal vein ([place]) 10/19/2021  History of pulmonary embolism 10/19/2021   Status post lumbar spinal fusion 01/03/2018   Lumbar stenosis 12/23/2017   Spondylolisthesis, lumbar region 06/25/2017   Achilles tendinitis of left lower extremity 12/07/2016   Achilles tendinitis, left leg 05/17/2016   Obstructive sleep apnea on CPAP 03/07/2015   Post-operative state 03/07/2015   Achilles rupture, left 10/21/2014   OSA (obstructive sleep apnea) 04/23/2013   Bipolar disorder, unspecified ([place]) 01/07/2013   Sinus tachycardia 01/07/2013   Headache(784.0) 01/07/2013   SDH (subdural hematoma) ([place]) 01/04/2013   Pulmonary  embolism ([place]) 01/04/2013   Chronic anticoagulation 01/04/2013   Warfarin-induced coagulopathy ([place]) 01/04/2013   Acute sinusitis 01/04/2013    REFERRING DIAG: R67.89FY (ICD-10-CM) - Hematoma of left thigh, initial encounter  THERAPY DIAG:  Other abnormalities of gait and mobility  Pain of left lower extremity  Muscle weakness (generalized)  Rationale for Evaluation and Treatment Rehabilitation  PERTINENT HISTORY:  Bipolar disorder Pulmonary embolism  Subdural hematoma Lumbar fusion  Spinal cord stimulator   PRECAUTIONS: Fall; history of DVT/PE on anti-coags  SUBJECTIVE:                                                                                                                                                                                      SUBJECTIVE STATEMENT:  Patient reports the leg has been feeling good without any pain. His back has been bothering him and he is currently under physician care for his back pain awaiting more information on the condition of his spinal cord stimulator.    PAIN:  Are you having pain? No    OBJECTIVE: (objective measures completed at initial evaluation unless otherwise dated)  DIAGNOSTIC FINDINGS:  LLE CT scan: IMPRESSION: 1. No arterial stenosis.  No findings of deep venous thrombosis. 2. Trace calcific plaques in the left internal iliac artery and scattered along the left anterior and posterior tibial arteries. 3. As compared to the study of 12/09/2021, large collection in the vastus lateralis muscle appears more organized and is more complex, with numerous enhancing septations and multiple tiny fluid pockets. The appearance is most suggestive of a pyomyositis although component of hematoma could still be present. No soft tissue gas is seen but there is edema in the anterior compartment deep soft tissue planes which can be seen with fasciitis. 4. Generalized left lower extremity edema. No subcutaneous abscess or soft tissue  gas. 5. 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 No overlying cortical subsidence or significant findings of secondary hip DJD. 6. 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 7. Stable 1.7 cm soft tissue nodule in the left ischioanal fossa subcutaneous fat. Nonspecific. Follow-up as indicated. 8. Mildly enlarged lymph  nodes in the left external iliac and inguinal chains probably reactive. 9. Mild prostatomegaly.     PATIENT SURVEYS:  LEFS 27/80 02/27/22: LEFS 46   COGNITION:           Overall cognitive status: Within functional limits for tasks assessed                          SENSATION: WFL   EDEMA:  Diffuse swelling about LLE; discoloration about distal LLE   MUSCLE LENGTH: Hamstrings: lacking 55 Lt; lacking 20 Rt  01/15/22: Lt lacking 35 degrees  02/27/22: Lt lacking 25 degrees    POSTURE: No Significant postural limitations   PALPATION: Diffuse tenderness about left lower leg and quad    LOWER EXTREMITY ROM:   Active ROM Right eval Left eval 01/24/22 02/07/22 Left  02/27/22 Left   Hip flexion         Hip extension         Hip abduction         Hip adduction         Hip internal rotation         Hip external rotation         Knee flexion 128 120     Knee extension WNL WNL     Ankle dorsiflexion 8 0 _0 Ankle plantarflexion         Ankle inversion         Ankle eversion          (Blank rows = not tested)   LOWER EXTREMITY MMT:   MMT Right eval Left eval 02/27/22 Left   Hip flexion _1 Hip extension       Hip abduction       Hip adduction       Hip internal rotation       Hip external rotation       Knee flexion _2 Knee extension 5 4- 5  Ankle dorsiflexion 5 3- 5  Ankle plantarflexion       Ankle inversion       Ankle eversion        (Blank rows = not tested)   LOWER EXTREMITY SPECIAL TESTS:  (+) [place]    FUNCTIONAL TESTS:  TUG 14.5 seconds 5 x STS: 20 seconds  SLS: 6 seconds LLE; 9 seconds RLE   02/27/22: 8.7 seconds 5 x  STS    GAIT: Distance walked: 25 ft Assistive device utilized: None Level of assistance: Complete Independence Comments: occasional buckling of the Lt knee; foot flat initial contact.        TODAY'S TREATMENT: [facility] Adult PT Treatment:                                                DATE: 02/27/22 Therapeutic Exercise: Reviewed and updated HEP discussing frequency, sets, reps.   Re-evaluation to determine overall progress, educating patient on progress towards goals.    [facility] Adult PT Treatment:                                                DATE: 02/12/22 Therapeutic Exercise: Recumbent bike level 3  x 5 minutes  Calf stretch on wedge x 60 seconds Ankle rockerboard A/P 2 x 10  4 way ankle blue band 2 x 10  Calf raise on airex 2 x 10  Tandem balance 3 x 30 sec  SLS x 20 sec each   [facility] Adult PT Treatment:                                                DATE: 02/07/22 Therapeutic Exercise: Recumbent bike level 3 x 5 minutes  Sit to stand 2 x 10  Cybex hip flexion 2 x 10; 25 lbs  Cybex hip abduction 2 x 10; 25 lbs  SLR 2 x 10        PATIENT EDUCATION:  Education details: See treatment; d/c education  Person educated: patient and son Education method: instruction, demo, handout Education comprehension: verbalized understanding      HOME EXERCISE PROGRAM: Access Code: [email or web address] URL: [email or web address] Date: 01/05/2022 Prepared by: Gwendolyn Grant   Exercises - Seated Long Arc Quad  - 2 x daily - 7 x weekly - 2 sets - 10 reps - Seated Toe Raise  - 2 x daily - 7 x weekly - 2 sets - 10 reps - Long Sitting Calf Stretch with Strap  - 2 x daily - 7 x weekly - 3 sets - 30 sec  hold - Seated Hamstring Stretch  - 2 x daily - 7 x weekly - 3 sets - 30 sec  hold   ASSESSMENT:   CLINICAL IMPRESSION: Patient has progressed well throughout duration of care in regards to his LLE pain following thigh hematoma. He demonstrates normalized gait mechanics without an AD,  improved balance, full strength about the LLE, and improvements in LLE flexibility and ROM. He has met all established functional goals and demonstrates independence with advanced home program. He reports ongoing chronic low back pain and was encouraged to continue to f/u with spine specialist for further assessment. He is appropriate for discharge at this time with patient in agreement with this plan.      OBJECTIVE IMPAIRMENTS Abnormal gait, decreased activity tolerance, decreased balance, decreased knowledge of condition, difficulty walking, decreased ROM, decreased strength, impaired flexibility, improper body mechanics, and pain.    ACTIVITY LIMITATIONS carrying, lifting, bending, standing, squatting, stairs, transfers, and locomotion level   PARTICIPATION LIMITATIONS: meal prep, cleaning, laundry, shopping, community activity, and yard work   PERSONAL FACTORS Age, Time since onset of injury/illness/exacerbation, and 3+ comorbidities: see PMH above   are also affecting patient's functional outcome.    REHAB POTENTIAL: Good   CLINICAL DECISION MAKING: Evolving/moderate complexity   EVALUATION COMPLEXITY: Moderate     GOALS: Goals reviewed with patient? Yes   SHORT TERM GOALS: Target date: 01/26/22 Patient will be independent and compliant with initial HEP.    Baseline: issued at eval  Goal status: met   2.  Patient will demonstrate at least 5 degrees of Lt ankle DF AROM to improve gait mechanics.  Baseline: 0 Goal status: met    3.  Patient will improve Lt hamstring flexibility by at least 15 degrees to reduce stress on the LLE with walking.  Baseline: lacking 55 Goal status: met      LONG TERM GOALS: Target date: 02/17/22   Patient will demonstrate 5/5 Lt knee extensor strength to reduce buckling with walking.  Baseline: see  above Goal status: met   2.  Patient will demonstrate 5/5 Lt ankle DF strength to improve ability to heel strike.  Baseline: see above  Goal  status: met   3.  Patient will score at least 40/80 on the LEFS to signify clinically meaningful improvement in functional abilities.    Baseline: 27/80 Goal status: met   4.  Patient will complete 5 x STS </= 13 seconds to improve his functional strength and ease of transfers.  Baseline: see above  Goal status: met   5.  Patient will ambulate community distances without need for AD.  Baseline: see above  Status: ambulates without AD Goal status: met     PLAN: PT FREQUENCY: n/a   PT DURATION: n/a   PLANNED INTERVENTIONS: Therapeutic exercises, Therapeutic activity, Neuromuscular re-education, Balance training, Gait training, Patient/Family education, Self Care, Manual therapy, and Re-evaluation   PLAN FOR NEXT SESSION: n/a  Gwendolyn Grant, PT, DPT, ATC 02/27/22 3:58 PM

## 2022-03-01 ENCOUNTER — Telehealth (HOSPITAL_COMMUNITY): Payer: Self-pay | Admitting: *Deleted

## 2022-03-01 NOTE — Telephone Encounter (Signed)
Reaching out to patient to offer assistance regarding upcoming cardiac imaging study; pt verbalizes understanding of appt date/time, parking situation and where to check in, pre-test NPO status and medications ordered, and verified current allergies; name and call back number provided for further questions should they arise  Alfonza Toft RN Navigator Cardiac Imaging [facility] [phone] office [phone] cell  Patient to take 100mg metoprolol tartrate two hours prior to his cardiac CT scan. He is aware to arrive at 8:30am. 

## 2022-03-02 ENCOUNTER — Ambulatory Visit: Payer: Medicaid Other

## 2022-03-02 ENCOUNTER — Encounter (HOSPITAL_COMMUNITY): Payer: Self-pay

## 2022-03-02 ENCOUNTER — Ambulatory Visit (HOSPITAL_COMMUNITY)
Admission: RE | Admit: 2022-03-02 | Discharge: 2022-03-02 | Disposition: A | Discharge status: Home / Self Care | Payer: Medicaid Other | Source: Ambulatory Visit | Attending: Interventional Cardiology | Admitting: Interventional Cardiology

## 2022-03-02 DIAGNOSIS — R0609 Other forms of dyspnea: Secondary | ICD-10-CM | POA: Diagnosis present

## 2022-03-02 DIAGNOSIS — R079 Chest pain, unspecified: Secondary | ICD-10-CM | POA: Diagnosis present

## 2022-03-02 MED ORDER — SODIUM CHLORIDE 0.9 % IV BOLUS: 250.0000 mL | Freq: Once | INTRAVENOUS | Status: DC

## 2022-03-02 MED ORDER — SODIUM CHLORIDE 0.9 % IV BOLUS
250.0000 mL | Freq: Once | INTRAVENOUS | Status: AC
  Administered 2022-03-02: 500 mL via INTRAVENOUS

## 2022-03-02 MED ORDER — IOHEXOL 350 MG/ML SOLN
100.0000 mL | Freq: Once | INTRAVENOUS | Status: AC | PRN
  Administered 2022-03-02: 100 mL via INTRAVENOUS

## 2022-03-02 MED ORDER — NITROGLYCERIN 0.4 MG SL SUBL
SUBLINGUAL_TABLET | SUBLINGUAL | Status: AC
  Administered 2022-03-02: 0.8 mg via SUBLINGUAL
  Filled 2022-03-02: qty 2

## 2022-03-02 MED ORDER — NITROGLYCERIN 0.4 MG SL SUBL: 0.8000 mg | SUBLINGUAL_TABLET | Freq: Once | SUBLINGUAL | Status: AC

## 2022-03-03 ENCOUNTER — Emergency Department (HOSPITAL_BASED_OUTPATIENT_CLINIC_OR_DEPARTMENT_OTHER): Payer: Medicaid Other | Admitting: Radiology

## 2022-03-03 ENCOUNTER — Encounter (HOSPITAL_BASED_OUTPATIENT_CLINIC_OR_DEPARTMENT_OTHER): Payer: Self-pay

## 2022-03-03 ENCOUNTER — Other Ambulatory Visit: Payer: Self-pay

## 2022-03-03 ENCOUNTER — Other Ambulatory Visit (HOSPITAL_BASED_OUTPATIENT_CLINIC_OR_DEPARTMENT_OTHER): Payer: Medicaid Other | Admitting: Radiology

## 2022-03-03 ENCOUNTER — Emergency Department (HOSPITAL_BASED_OUTPATIENT_CLINIC_OR_DEPARTMENT_OTHER)
Admission: EM | Admit: 2022-03-03 | Discharge: 2022-03-03 | Disposition: A | Discharge status: Home / Self Care | Payer: Medicaid Other | Attending: Emergency Medicine | Admitting: Emergency Medicine

## 2022-03-03 DIAGNOSIS — J168 Pneumonia due to other specified infectious organisms: Secondary | ICD-10-CM | POA: Diagnosis not present

## 2022-03-03 DIAGNOSIS — J189 Pneumonia, unspecified organism: Secondary | ICD-10-CM

## 2022-03-03 DIAGNOSIS — R0789 Other chest pain: Secondary | ICD-10-CM | POA: Diagnosis present

## 2022-03-03 DIAGNOSIS — Z1152 Encounter for screening for COVID-19: Secondary | ICD-10-CM | POA: Diagnosis not present

## 2022-03-03 LAB — BASIC METABOLIC PANEL
Anion gap: 9 (ref 5–15)
BUN: 6 mg/dL (ref 6–20)
CO2: 24 mmol/L (ref 22–32)
Calcium: 9 mg/dL (ref 8.9–10.3)
Chloride: 106 mmol/L (ref 98–111)
Creatinine, Ser: 1.15 mg/dL (ref 0.61–1.24)
GFR, Estimated: >60 mL/min (ref >60)
Glucose, Bld: 109 mg/dL — ABNORMAL HIGH (ref 70–99)
Potassium: 3.8 mmol/L (ref 3.5–5.1)
Sodium: 139 mmol/L (ref 135–145)

## 2022-03-03 LAB — CBC
HCT: 36.5 % — ABNORMAL LOW (ref 39.0–52.0)
Hemoglobin: 12.1 g/dL — ABNORMAL LOW (ref 13.0–17.0)
MCH: 28.3 pg (ref 26.0–34.0)
MCHC: 33.2 g/dL (ref 30.0–36.0)
MCV: 85.3 fL (ref 80.0–100.0)
Platelets: 269 10*3/uL (ref 150–400)
RBC: 4.28 MIL/uL (ref 4.22–5.81)
RDW: 14.6 % (ref 11.5–15.5)
WBC: 12.1 10*3/uL — ABNORMAL HIGH (ref 4.0–10.5)
nRBC: 0 % (ref 0.0–0.2)

## 2022-03-03 LAB — RESP PANEL BY RT-PCR (RSV, FLU A&B, COVID)  RVPGX2
Influenza A by PCR: NEGATIVE
Influenza B by PCR: NEGATIVE
Resp Syncytial Virus by PCR: NEGATIVE
SARS Coronavirus 2 by RT PCR: NEGATIVE

## 2022-03-03 LAB — TROPONIN I (HIGH SENSITIVITY)
Troponin I (High Sensitivity): 13 ng/L (ref <18)
Troponin I (High Sensitivity): 14 ng/L (ref <18)

## 2022-03-03 LAB — D-DIMER, QUANTITATIVE: D-Dimer, Quant: <0.27 ug/mL-FEU (ref 0.00–0.50)

## 2022-03-03 MED ORDER — AMOXICILLIN-POT CLAVULANATE 875-125 MG PO TABS: 1.0000 | ORAL_TABLET | Freq: Two times a day (BID) | ORAL | 0 refills | Status: DC

## 2022-03-03 MED ORDER — AZITHROMYCIN 250 MG PO TABS: ORAL_TABLET | ORAL | 0 refills | Status: DC

## 2022-03-03 MED ORDER — HYDROCODONE-ACETAMINOPHEN 5-325 MG PO TABS
1.0000 | ORAL_TABLET | Freq: Once | ORAL | Status: AC
  Administered 2022-03-03: 1 via ORAL
  Filled 2022-03-03: qty 1

## 2022-03-03 NOTE — Discharge Instructions (Addendum)
[name], Justin [name] sorry that you are having this chest pain.  Based on our workup here, it does not look like it is coming from your heart.  There is a chance that you have some sort of a respiratory infection that is causing this.  I am therefore sending 2 different antibiotics to your pharmacy.  I want you to take them both for the next 5 days to try and treat this.  You also need to follow-up with both your cardiologist and your primary care doctor as we discussed.  Dorothyann Gibbs, MD

## 2022-03-03 NOTE — ED Provider Notes (Signed)
[facility] EMERGENCY DEPT Provider Note   CSN: [id number] Arrival date & time: 03/03/22  1341     History  Chief Complaint  Patient presents with   Chest Pain    Justin [name] is a 56 y.o. male.  HPI Patient coming in here with 1 day of left-sided anterior chest pain.  Was seen for the same here about a month ago.  Pain began this morning and was sharp, nonradiating.  Pain is not exertional. It is worse when he tries to take a deep breath and feels like he is generally short of breath and cannot breathe deeply at this time.  He has not had any fevers or chills.  No cough, though he is short of breath.  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      Home Medications Prior to Admission medications   Medication Sig Start Date End Date Taking? Authorizing Provider  amoxicillin-clavulanate (AUGMENTIN) 875-125 MG tablet Take 1 tablet by mouth 2 (two) times daily. 03/03/22   Alicia Amel, MD  azithromycin (ZITHROMAX Z-PAK) 250 MG tablet Take two tablets (500mg ) on the first day followed by 1 tablet (250 mg) on the next four days for a total of five days of therapy 03/03/22   Alicia Amel, MD  dabigatran (PRADAXA) 150 MG CAPS capsule Take 1 capsule (150 mg total) by mouth 2 (two) times daily. 01/19/22   Briant Cedar, PA-C  diclofenac Sodium (VOLTAREN) 1 % GEL Apply 4 g topically 4 (four) times daily as needed (pain). 12/08/21   [provider]  doxepin (SINEQUAN) 75 MG capsule Take 75 mg by mouth at bedtime. 12/14/19   [provider]  HYDROcodone-acetaminophen (NORCO) 7.5-325 MG tablet Take 0.5-1 tablets by mouth every 8 (eight) hours as needed for moderate pain or severe pain. 12/25/21   Amin, Loura Halt, MD  lithium 600 MG capsule Take 600 mg by mouth at bedtime.    [provider]  metoprolol tartrate (LOPRESSOR) 100 MG tablet Take 1 tablet (100 mg total) by  mouth once for 1 dose. Take 90-120 minutes prior to scan. 02/26/22 02/26/22  Lyn Records, MD  mirtazapine (REMERON) 45 MG tablet Take 45 mg by mouth at bedtime.    [provider]  QUEtiapine (SEROQUEL XR) 400 MG 24 hr tablet Take 800 mg by mouth at bedtime. 11/16/21   [provider]  tiZANidine (ZANAFLEX) 4 MG tablet Take 4 mg by mouth at bedtime.  12/17/19   [provider]      Allergies    Lyrica [pregabalin]    Review of Systems   Review of Systems  Constitutional:  Positive for fatigue. Negative for fever.  Respiratory:  Positive for chest tightness and shortness of breath. Negative for cough.   Cardiovascular:  Positive for chest pain. Negative for leg swelling.    Physical Exam Updated Vital Signs BP (!) 132/104 (BP Location: Left Arm)   Pulse 98   Temp 100.3 F (37.9 C) (Oral)   Resp (!) 24   Ht 5\' 9"  (1.753 m)   Wt 99.5 kg   SpO2 96%   BMI 32.39 kg/m  Physical Exam Vitals reviewed.  Constitutional:      General: He is not in acute distress. Cardiovascular:     Rate and Rhythm: Regular rhythm. Tachycardia present.     Heart sounds: No murmur heard.  No friction rub. No gallop.  Pulmonary:     Comments: Normal work of breathing on room air, speaking in full sentences.  Crackles in the right lower lung fields.  Clear otherwise. Chest:     Chest wall: Tenderness (Over left chest wall, midclavicular line, fourth-fifth rib area) present.  Abdominal:     General: There is no abdominal bruit.     Tenderness: There is no abdominal tenderness.  Musculoskeletal:     Right lower leg: No edema.     Left lower leg: No edema.  Skin:    General: Skin is warm and dry.  Neurological:     General: No focal deficit present.     Mental Status: He is alert.  Psychiatric:        Mood and Affect: Mood normal.    ED Results / Procedures / Treatments   Labs (all labs ordered are listed, but only abnormal results are displayed) Labs Reviewed   BASIC METABOLIC PANEL - Abnormal; Notable for the following components:      Result Value   Glucose, Bld 109 (*)    All other components within normal limits  CBC - Abnormal; Notable for the following components:   WBC 12.1 (*)    Hemoglobin 12.1 (*)    HCT 36.5 (*)    All other components within normal limits  RESP PANEL BY RT-PCR (RSV, FLU A&B, COVID)  [id number]  D-DIMER, QUANTITATIVE  TROPONIN I (HIGH SENSITIVITY)  TROPONIN I (HIGH SENSITIVITY)    EKG None  Radiology DG Chest 2 View  Result Date: 03/03/2022 CLINICAL DATA:  Acute chest pain and shortness of breath. EXAM: CHEST - 2 VIEW COMPARISON:  02/03/2022 and prior radiographs FINDINGS: The cardiomediastinal silhouette is unremarkable. Mild pulmonary vascular congestion noted. There is no evidence of focal airspace disease, pulmonary edema, suspicious pulmonary nodule/mass, pleural effusion, or pneumothorax. No acute bony abnormalities are identified. Spinal stimulator again noted. IMPRESSION: Mild pulmonary vascular congestion. Electronically Signed   By: Margarette Canada M.D.   On: 03/03/2022 14:17   CT CORONARY MORPH W/CTA COR W/SCORE W/CA W/CM &/OR WO/CM  Addendum Date: 03/02/2022   ADDENDUM REPORT: 03/02/2022 15:59 EXAM: OVER-READ INTERPRETATION  CT CHEST The following report is a limited chest CT over-read performed by radiologist Dr. Abigail Miyamoto of [facility], [place] on 03/02/2022. This over-read does not include interpretation of cardiac or coronary anatomy or pathology. The coronary CTA interpretation by the cardiologist is attached. COMPARISON:  10/12/2021 CTA chest FINDINGS: Vascular: Aortic atherosclerosis. No central pulmonary embolism, on this non-dedicated study. Pulmonary artery enlargement, outflow tract 3.3 cm. Mediastinum/Nodes: No imaged thoracic adenopathy. Lungs/Pleura: No pleural fluid. Bibasilar scarring or subsegmental atelectasis. Posterior right upper and superior segment right lower lobe ground-glass.  Upper Abdomen: Mild hepatic steatosis. Normal imaged portions of the spleen, stomach. Musculoskeletal: Dorsal spinal stimulator. IMPRESSION: 1. Mild areas of right upper and superior segment right lower lobe ground-glass. Differential considerations include minimal atypical infection or pulmonary edema (felt less likely given lack of pleural fluid). 2. Pulmonary artery enlargement suggests pulmonary arterial hypertension. 3.  Aortic Atherosclerosis (ICD10-I70.0). 4. Mild hepatic steatosis Electronically Signed   By: Abigail Miyamoto M.D.   On: 03/02/2022 15:59   Result Date: 03/02/2022 CLINICAL DATA:  Chest pain EXAM: Cardiac/Coronary CTA TECHNIQUE: A non-contrast, gated CT scan was obtained with axial slices of 3 mm through the heart for calcium scoring. Calcium scoring was performed using the Agatston method. A 120 kV prospective, gated, contrast cardiac scan was obtained. Gantry  rotation speed was 250 msecs and collimation was 0.6 mm. Two sublingual nitroglycerin tablets (0.8 mg) were given. The 3D data set was reconstructed in 5% intervals of the 35-75% of the R-R cycle. Diastolic phases were analyzed on a dedicated workstation using MPR, MIP, and VRT modes. The patient received 95 cc of contrast. FINDINGS: Image quality: Excellent. Noise artifact is: Limited. Coronary Arteries:  Normal coronary origin.  Right dominance. Left main: The left main is a large caliber vessel with a normal take off from the left coronary cusp that bifurcates to form a left anterior descending artery and a left circumflex artery. There is no plaque or stenosis. Left anterior descending artery: The proximal LAD is patent. The mid LAD contains mild mixed density plaque (25-49%). The distal LAD is patent. D1 contains minimal calcified plaque (<25%). D2 is patent. Left circumflex artery: The LCX is non-dominant. The proximal LAD contains minimal calcified plaque (<25%). There is mild calcified plaque (25-49%) in the distal segment. And  patent with no evidence of plaque or stenosis. The LCX gives off 3 patent obtuse marginal branches. Right coronary artery: The RCA is dominant with normal take off from the right coronary cusp. There is minimal non-calcified plaque (<25%). The RCA terminates as a PDA without evidence of plaque or stenosis. Right Atrium: Right atrial size is dilated. Right Ventricle: The right ventricular cavity is within normal limits. Left Atrium: Left atrial size is normal in size with no left atrial appendage filling defect. Left Ventricle: The ventricular cavity size is severely dilated (70 mm). Pulmonary arteries: Dilated pulmonary artery suggestive of pulmonary hypertension. Pulmonary veins: Normal pulmonary venous drainage. Pericardium: Normal thickness without significant effusion or calcium present. Cardiac valves: The aortic valve is trileaflet without significant calcification. The mitral valve is normal without significant calcification. Aorta: Normal caliber without significant disease. Extra-cardiac findings: See attached radiology report for non-cardiac structures. IMPRESSION: 1. Coronary calcium score of 49. This was 80th percentile for age-, sex, and race-matched controls. 2. Mild CAD (25-49%) in the LAD/LCX. 3. Minimal CAD (<25%) in the RCA. 4. Severely dilated left ventricle up to 70 mm. Echocardiogram recommended. 5. Dilated pulmonary artery suggestive of pulmonary hypertension. RECOMMENDATIONS: 1. Mild non-obstructive CAD (25-49%). Consider non-atherosclerotic causes of chest pain. Consider preventive therapy and risk factor modification. Eleonore Chiquito, MD Electronically Signed: By: Eleonore Chiquito M.D. On: 03/02/2022 12:34    Procedures Procedures    Medications Ordered in ED Medications  HYDROcodone-acetaminophen (NORCO/VICODIN) 5-325 MG per tablet 1 tablet (1 tablet Oral Given 03/03/22 1923)    ED Course/ Medical Decision Making/ A&P                           Medical Decision Making Patient  presenting with left-sided pleuritic chest pain.  Seen for the same about a month ago.  Chest pain described as not anginal.  Troponin negative and EKG with evidence of LVH but no new findings compared to previous.  Do not suspect ACS.  On my initial evaluation, the patient was tachypneic and reporting pleuritic chest pain, known to be on Pradaxa for history of PE/DVT, therefore ordered D-dimer which was within normal limits. Chest x-ray today notable for some pulmonary vascular congestion.  Reviewed his CT coronary from yesterday and note that there is some groundglass opacity to his right lower lobe consistent with a pneumonia versus pulmonary edema.  Taken together with a white blood cell count of 12.1 and an elevated temp to 100.3  this raises concern for a community-acquired pneumonia. COVID/flu/RSV testing is negative. Therefore will treat with Augmentin and azithromycin.  There is also possibility of new heart failure, he certainly will need an echocardiogram.  Is scheduled for 1 on 03/29/2022.  Do not see a need to emergently perform an echo while he is here in the ED.  He has follow-up with his cardiologist in will reach out to his primary care provider for a appointment as well.  Hemodynamically stable at the time of discharge.    Amount and/or Complexity of Data Reviewed Labs: ordered. Radiology: ordered.  Risk Prescription drug management.    Final Clinical Impression(s) / ED Diagnoses Final diagnoses:  Pneumonia of right lung due to infectious organism, unspecified part of lung    Rx / DC Orders ED Discharge Orders          Ordered     03/03/22 1916     03/03/22 1916    amoxicillin-clavulanate (AUGMENTIN) 875-125 MG tablet  2 times daily        03/03/22 1923    azithromycin (ZITHROMAX Z-PAK) 250 MG tablet        03/03/22 1923           Dorothyann Gibbs, MD    Alicia Amel, MD 03/03/22 2211    Rondel Baton, MD 03/05/22 [phone]

## 2022-03-03 NOTE — ED Triage Notes (Signed)
Patient here POV from Home.  Endorses having Intermittent CP for approximately 1 Week. Had CT Coronary Study completed yesterday and is waiting for results but became concerned with Pain today. Some SOB.   NAD noted during Triage. A&Ox4. GCS 15. Ambulatory.

## 2022-03-07 ENCOUNTER — Other Ambulatory Visit: Payer: Medicaid Other

## 2022-03-15 ENCOUNTER — Encounter: Payer: Self-pay | Admitting: Primary Care

## 2022-03-15 ENCOUNTER — Ambulatory Visit (INDEPENDENT_AMBULATORY_CARE_PROVIDER_SITE_OTHER): Payer: Commercial Managed Care - HMO | Admitting: Primary Care

## 2022-03-15 VITALS — BP 118/68 | HR 95 | Temp 98.2°F | Ht 69.0 in | Wt 220.6 lb

## 2022-03-15 DIAGNOSIS — R0683 Snoring: Secondary | ICD-10-CM

## 2022-03-15 DIAGNOSIS — G4733 Obstructive sleep apnea (adult) (pediatric): Secondary | ICD-10-CM | POA: Diagnosis not present

## 2022-03-15 NOTE — Assessment & Plan Note (Addendum)
-   Hx sleep apnea. NPSG 2009:  AHI 31/hr. NPSG 05/2013:  AHI 23/hr, cpap 13cm. He is intolerant to CPAP, difficulty wearing mask and changing supplies as regularly as he should be. He is interested in [place] device. Most recent sleep study was done at home in October 2022 that showed patient had mild obstructive sleep apnea, AHI 13.3/hour with SpO2 low 77%.  Due to mild severity of sleep apnea patient is not currently candidate for inspire device.  Recommend repeating in lab sleep study to reassess degree of sleep apnea.  If OSA is moderate-severe will refer to ENT, if remains in mild category we can consider oral appliance as alternative treatment option for OSA instead of CPAP.  Encourage patient focus on side sleeping position or elevate head of bed 30 degrees while sleeping on his back.  Advised he avoid drinking alcohol in excess prior to bedtime as this can worsen underlying sleep apnea.  Cautioned against driving if experiencing excessive daytime sleepiness or fatigue.  Follow-up in 3 months or sooner if needed.

## 2022-03-15 NOTE — Progress Notes (Addendum)
@Patient  ID: Justin [name], male    DOB: 05/03/1965, 56 y.o.   MRN: [id number]  Chief Complaint  Patient presents with   Follow-up    Does not breathing "right" during sleep.  Is interested in [employer].  CPAP is not comfortable. Pt thinks he has had the flu vaccine but does not remember when, thinks at [facility].    Referring provider: Pa, [facility]  HPI: 56 year old male, never smoked.  Past medical history significant for OSA, DVT, PE (on chronic anticoagulation), SDH s/p craniotomy, PNA, anemia, bipolar disorder.  Patient of Dr. Craige Cotta, last seen 11/04/21.   03/15/2022 Patient is her for follow-up OSA. He is a patient fo Dr. Craige Cotta. Intested in learning more about inspire device. His last sleep study was in October 2022 which showed mild sleep apnea. He has been on CPAP for years, he has not been using regularly over the last couple of months. He has a hard time wearing mask and is inconsistent about changing his supplies. Unsure if he snores. Sometimes during the day he is tired. He has no acute respiratory complaints or chest pain today.   Chart reviewed. Seen at [facility] ED on 03/03/22 for left sided chest pain. Troponin negative and EKG with evidence of LVH but no new findings compared to previous.  Low suspicion for acute coronary syndrome.  D-dimer was normal.  Chest x-ray showed some pulmonary vascular congestion but was negative for focal airspace disease, pulmonary edema or suspicious pulmonary nodules.    Airview download 12/04/21-03/03/22 Usage 2/90 days Average usage days used 1 hours 4 mins Pressure 5-15cm h20 (9.1cm h20-95%) Airleaks 55L/min (95%) AHI 0.8   Allergies  Allergen Reactions   Lyrica [Pregabalin] Other (See Comments)    Hallucinations    Immunization History  Administered Date(s) Administered   Influenza,inj,Quad PF,6+ Mos 03/19/2012, 03/02/2014, 12/18/2014, 11/13/2016, 12/09/2017, 12/23/2018   Influenza-Unspecified 08/05/2016   Tdap  08/07/2015    Past Medical History:  Diagnosis Date   Achilles rupture, left    Bipolar 1 disorder (HCC)    Dental caries    periodontitis   Pneumonia    Pulmonary embolism (HCC) 05/18/2007   Sleep apnea    wears CPAP   Subdural hematoma (HCC) 01/04/2013   in setting of supratherapeutic INR    Tobacco History: Social History   Tobacco Use  Smoking Status Never  Smokeless Tobacco Never   Counseling given: Not Answered   Outpatient Medications Prior to Visit  Medication Sig Dispense Refill   dabigatran (PRADAXA) 150 MG CAPS capsule Take 1 capsule (150 mg total) by mouth 2 (two) times daily. 180 capsule 0   doxepin (SINEQUAN) 75 MG capsule Take 75 mg by mouth at bedtime.     HYDROcodone-acetaminophen (NORCO) 7.5-325 MG tablet Take 0.5-1 tablets by mouth every 8 (eight) hours as needed for moderate pain or severe pain. 21 tablet 0   lithium 600 MG capsule Take 600 mg by mouth at bedtime.     mirtazapine (REMERON) 45 MG tablet Take 45 mg by mouth at bedtime.     QUEtiapine (SEROQUEL XR) 400 MG 24 hr tablet Take 800 mg by mouth at bedtime.     tiZANidine (ZANAFLEX) 4 MG tablet Take 4 mg by mouth at bedtime.      amoxicillin-clavulanate (AUGMENTIN) 875-125 MG tablet Take 1 tablet by mouth 2 (two) times daily. (Patient not taking: Reported on 03/15/2022) 10 tablet 0   azithromycin (ZITHROMAX Z-PAK) 250 MG tablet Take two tablets (500mg )  on the first day followed by 1 tablet (250 mg) on the next four days for a total of five days of therapy (Patient not taking: Reported on 03/15/2022) 6 each 0   diclofenac Sodium (VOLTAREN) 1 % GEL Apply 4 g topically 4 (four) times daily as needed (pain). (Patient not taking: Reported on 03/15/2022)     metoprolol tartrate (LOPRESSOR) 100 MG tablet Take 1 tablet (100 mg total) by mouth once for 1 dose. Take 90-120 minutes prior to scan. (Patient not taking: Reported on 03/15/2022) 1 tablet 0   No facility-administered medications prior to visit.    Review of Systems  Review of Systems  Constitutional:  Positive for fatigue.  HENT: Negative.    Respiratory: Negative.    Cardiovascular: Negative.     Physical Exam  BP 118/68 (BP Location: Right Arm, Patient Position: Sitting, Cuff Size: Normal)   Pulse 95   Temp 98.2 F (36.8 C) (Oral)   Ht 5\' 9"  (1.753 m)   Wt 220 lb 9.6 oz (100.1 kg)   SpO2 96%   BMI 32.58 kg/m  Physical Exam Constitutional:      Appearance: Normal appearance.  HENT:     Head: Normocephalic and atraumatic.  Cardiovascular:     Rate and Rhythm: Normal rate and regular rhythm.  Pulmonary:     Effort: Pulmonary effort is normal.     Breath sounds: Normal breath sounds.  Musculoskeletal:        General: Normal range of motion.  Skin:    General: Skin is warm and dry.  Neurological:     General: No focal deficit present.     Mental Status: He is alert and oriented to person, place, and time. Mental status is at baseline.  Psychiatric:        Mood and Affect: Mood normal.        Behavior: Behavior normal.        Thought Content: Thought content normal.        Judgment: Judgment normal.      Lab Results:  CBC    Component Value Date/Time   WBC 12.1 (H) 03/03/2022 1351   RBC 4.28 03/03/2022 1351   HGB 12.1 (L) 03/03/2022 1351   HGB 12.2 (L) 01/19/2022 1005   HCT 36.5 (L) 03/03/2022 1351   PLT 269 03/03/2022 1351   PLT 248 01/19/2022 1005   MCV 85.3 03/03/2022 1351   MCH 28.3 03/03/2022 1351   MCHC 33.2 03/03/2022 1351   RDW 14.6 03/03/2022 1351   LYMPHSABS 2.3 01/19/2022 1005   MONOABS 0.5 01/19/2022 1005   EOSABS 0.2 01/19/2022 1005   BASOSABS 0.1 01/19/2022 1005    BMET    Component Value Date/Time   NA 139 03/03/2022 1351   NA 145 (H) 02/26/2022 1213   K 3.8 03/03/2022 1351   CL 106 03/03/2022 1351   CO2 24 03/03/2022 1351   GLUCOSE 109 (H) 03/03/2022 1351   BUN 6 03/03/2022 1351   BUN 4 (L) 02/26/2022 1213   CREATININE 1.15 03/03/2022 1351   CREATININE 1.19  01/19/2022 1005   CREATININE 1.34 (H) 10/11/2021 0000   CALCIUM 9.0 03/03/2022 1351   GFRNONAA >60 03/03/2022 1351   GFRNONAA >60 01/19/2022 1005   GFRNONAA 53 (L) 04/21/2020 0000   GFRAA 62 04/21/2020 0000    BNP    Component Value Date/Time   BNP 23.4 12/08/2021 2007    ProBNP    Component Value Date/Time   PROBNP 547 (H) 02/26/2022 1213  Imaging: DG Chest 2 View  Result Date: 03/03/2022 CLINICAL DATA:  Acute chest pain and shortness of breath. EXAM: CHEST - 2 VIEW COMPARISON:  02/03/2022 and prior radiographs FINDINGS: The cardiomediastinal silhouette is unremarkable. Mild pulmonary vascular congestion noted. There is no evidence of focal airspace disease, pulmonary edema, suspicious pulmonary nodule/mass, pleural effusion, or pneumothorax. No acute bony abnormalities are identified. Spinal stimulator again noted. IMPRESSION: Mild pulmonary vascular congestion. Electronically Signed   By: Harmon Pier M.D.   On: 03/03/2022 14:17   CT CORONARY MORPH W/CTA COR W/SCORE W/CA W/CM &/OR WO/CM  Addendum Date: 03/02/2022   ADDENDUM REPORT: 03/02/2022 15:59 EXAM: OVER-READ INTERPRETATION  CT CHEST The following report is a limited chest CT over-read performed by radiologist Dr. Jeronimo Greaves of [facility] on 03/02/2022. This over-read does not include interpretation of cardiac or coronary anatomy or pathology. The coronary CTA interpretation by the cardiologist is attached. COMPARISON:  10/12/2021 CTA chest FINDINGS: Vascular: Aortic atherosclerosis. No central pulmonary embolism, on this non-dedicated study. Pulmonary artery enlargement, outflow tract 3.3 cm. Mediastinum/Nodes: No imaged thoracic adenopathy. Lungs/Pleura: No pleural fluid. Bibasilar scarring or subsegmental atelectasis. Posterior right upper and superior segment right lower lobe ground-glass. Upper Abdomen: Mild hepatic steatosis. Normal imaged portions of the spleen, stomach. Musculoskeletal: Dorsal spinal  stimulator. IMPRESSION: 1. Mild areas of right upper and superior segment right lower lobe ground-glass. Differential considerations include minimal atypical infection or pulmonary edema (felt less likely given lack of pleural fluid). 2. Pulmonary artery enlargement suggests pulmonary arterial hypertension. 3.  Aortic Atherosclerosis (ICD10-I70.0). 4. Mild hepatic steatosis Electronically Signed   By: Jeronimo Greaves M.D.   On: 03/02/2022 15:59   Result Date: 03/02/2022 CLINICAL DATA:  Chest pain EXAM: Cardiac/Coronary CTA TECHNIQUE: A non-contrast, gated CT scan was obtained with axial slices of 3 mm through the heart for calcium scoring. Calcium scoring was performed using the Agatston method. A 120 kV prospective, gated, contrast cardiac scan was obtained. Gantry rotation speed was 250 msecs and collimation was 0.6 mm. Two sublingual nitroglycerin tablets (0.8 mg) were given. The 3D data set was reconstructed in 5% intervals of the 35-75% of the R-R cycle. Diastolic phases were analyzed on a dedicated workstation using MPR, MIP, and VRT modes. The patient received 95 cc of contrast. FINDINGS: Image quality: Excellent. Noise artifact is: Limited. Coronary Arteries:  Normal coronary origin.  Right dominance. Left main: The left main is a large caliber vessel with a normal take off from the left coronary cusp that bifurcates to form a left anterior descending artery and a left circumflex artery. There is no plaque or stenosis. Left anterior descending artery: The proximal LAD is patent. The mid LAD contains mild mixed density plaque (25-49%). The distal LAD is patent. D1 contains minimal calcified plaque (<25%). D2 is patent. Left circumflex artery: The LCX is non-dominant. The proximal LAD contains minimal calcified plaque (<25%). There is mild calcified plaque (25-49%) in the distal segment. And patent with no evidence of plaque or stenosis. The LCX gives off 3 patent obtuse marginal branches. Right coronary  artery: The RCA is dominant with normal take off from the right coronary cusp. There is minimal non-calcified plaque (<25%). The RCA terminates as a PDA without evidence of plaque or stenosis. Right Atrium: Right atrial size is dilated. Right Ventricle: The right ventricular cavity is within normal limits. Left Atrium: Left atrial size is normal in size with no left atrial appendage filling defect. Left Ventricle: The ventricular cavity size is severely  dilated (70 mm). Pulmonary arteries: Dilated pulmonary artery suggestive of pulmonary hypertension. Pulmonary veins: Normal pulmonary venous drainage. Pericardium: Normal thickness without significant effusion or calcium present. Cardiac valves: The aortic valve is trileaflet without significant calcification. The mitral valve is normal without significant calcification. Aorta: Normal caliber without significant disease. Extra-cardiac findings: See attached radiology report for non-cardiac structures. IMPRESSION: 1. Coronary calcium score of 49. This was 80th percentile for age-, sex, and race-matched controls. 2. Mild CAD (25-49%) in the LAD/LCX. 3. Minimal CAD (<25%) in the RCA. 4. Severely dilated left ventricle up to 70 mm. Echocardiogram recommended. 5. Dilated pulmonary artery suggestive of pulmonary hypertension. RECOMMENDATIONS: 1. Mild non-obstructive CAD (25-49%). Consider non-atherosclerotic causes of chest pain. Consider preventive therapy and risk factor modification. Lennie Odor, MD Electronically Signed: By: Lennie Odor M.D. On: 03/02/2022 12:34     Assessment & Plan:   OSA (obstructive sleep apnea) - Hx sleep apnea. NPSG 2009:  AHI 31/hr. NPSG 05/2013:  AHI 23/hr, cpap 13cm. He is intolerant to CPAP, difficulty wearing mask and changing supplies as regularly as he should be. He is interested in [employer] device. Most recent sleep study was done at home in October 2022 that showed patient had mild obstructive sleep apnea, AHI 13.3/hour with  SpO2 low 77%.  Due to mild severity of sleep apnea patient is not currently candidate for inspire device.  Recommend repeating in lab sleep study to reassess degree of sleep apnea.  If OSA is moderate-severe will refer to ENT, if remains in mild category we can consider oral appliance as alternative treatment option for OSA instead of CPAP.  Encourage patient focus on side sleeping position or elevate head of bed 30 degrees while sleeping on his back.  Advised he avoid drinking alcohol in excess prior to bedtime as this can worsen underlying sleep apnea.  Cautioned against driving if experiencing excessive daytime sleepiness or fatigue.  Follow-up in 3 months or sooner if needed.   Glenford Bayley, NP 03/15/2022

## 2022-03-15 NOTE — Patient Instructions (Signed)
Justin [name] is currently only approved for the treatment of moderate to severe obstructive sleep apnea Your most recent sleep study in October 2022 showed mild sleep apnea, however this was borderline.   Would recommend getting a repeat in lab sleep study to reassess severity of OSA.    Alternative treatment options for mild sleep apnea is an oral appliance.  If unable to use CPAP, focus on side sleeping position is much as possible or elevate head of bed 30 degrees if sleeping on your back.  Avoid excessive alcohol prior to bedtime as this can worsen underlying sleep apnea.  Avoid driving if experiencing excessive daytime sleepiness or fatigue.  Orders: PSG re: sleep apnea/snoring  Follow-up: 3 months with Dr. Craige Cotta

## 2022-03-22 ENCOUNTER — Emergency Department (HOSPITAL_BASED_OUTPATIENT_CLINIC_OR_DEPARTMENT_OTHER)
Admission: EM | Admit: 2022-03-22 | Discharge: 2022-03-22 | Discharge status: Against Medical Advice | Payer: Commercial Managed Care - HMO

## 2022-03-23 ENCOUNTER — Ambulatory Visit
Admission: RE | Admit: 2022-03-23 | Discharge: 2022-03-23 | Disposition: A | Discharge status: Home / Self Care | Payer: Medicaid Other | Source: Ambulatory Visit | Attending: Internal Medicine | Admitting: Internal Medicine

## 2022-03-23 DIAGNOSIS — E0590 Thyrotoxicosis, unspecified without thyrotoxic crisis or storm: Secondary | ICD-10-CM

## 2022-03-29 ENCOUNTER — Ambulatory Visit (HOSPITAL_COMMUNITY): Payer: Commercial Managed Care - HMO | Attending: Interventional Cardiology

## 2022-03-29 DIAGNOSIS — R079 Chest pain, unspecified: Secondary | ICD-10-CM

## 2022-03-29 DIAGNOSIS — R0609 Other forms of dyspnea: Secondary | ICD-10-CM

## 2022-03-29 LAB — ECHOCARDIOGRAM COMPLETE
Area-P 1/2: 6.07 cm2
MV M vel: 4.66 m/s
MV Peak grad: 86.9 mmHg
Radius: 0.75 cm
S' Lateral: 5.9 cm

## 2022-04-04 ENCOUNTER — Ambulatory Visit (HOSPITAL_BASED_OUTPATIENT_CLINIC_OR_DEPARTMENT_OTHER): Payer: Commercial Managed Care - HMO | Attending: Primary Care | Admitting: Pulmonary Disease

## 2022-04-04 VITALS — Ht 69.0 in | Wt 230.0 lb

## 2022-04-04 DIAGNOSIS — G4733 Obstructive sleep apnea (adult) (pediatric): Secondary | ICD-10-CM

## 2022-04-04 DIAGNOSIS — R0683 Snoring: Secondary | ICD-10-CM | POA: Diagnosis not present

## 2022-04-04 DIAGNOSIS — I493 Ventricular premature depolarization: Secondary | ICD-10-CM | POA: Insufficient documentation

## 2022-04-05 DIAGNOSIS — R0683 Snoring: Secondary | ICD-10-CM | POA: Diagnosis not present

## 2022-04-05 NOTE — Procedures (Signed)
      Patient Name: Justin, [name] Date: 04/04/2022 Gender: Male D.O.B: 1965-03-30 Age (years): 4 Referring Provider: Geraldo Pitter NP Height (inches): 69 Interpreting Physician: Chesley Mires MD, ABSM Weight (lbs): 230 RPSGT: Laren Everts BMI: 34 MRN: [id number] Neck Size: 17.00  CLINICAL INFORMATION Sleep Study Type: NPSG  Indication for sleep study: Depression, Obesity, Re-Evaluation, Snoring, Witnesses Apnea / Gasping During Sleep  Epworth Sleepiness Score: 5  Most recent polysomnogram dated 01/08/2021 revealed an AHI of 13.3/h.  SLEEP STUDY TECHNIQUE As per the AASM Manual for the Scoring of Sleep and Associated Events v2.3 (April 2016) with a hypopnea requiring 4% desaturations.  The channels recorded and monitored were frontal, central and occipital EEG, electrooculogram (EOG), submentalis EMG (chin), nasal and oral airflow, thoracic and abdominal wall motion, anterior tibialis EMG, snore microphone, electrocardiogram, and pulse oximetry.  MEDICATIONS Medications self-administered by patient taken the night of the study : DICYCLOMINE, DOXEPIN, LITHIUM, REMERON, SEROQUEL  SLEEP ARCHITECTURE The study was initiated at 10:07:14 PM and ended at 5:30:47 AM.  Sleep onset time was 28.4 minutes and the sleep efficiency was 92.3%. The total sleep time was 409.6 minutes.  Stage REM latency was 89.5 minutes.  The patient spent 2.9% of the night in stage N1 sleep, 77.7% in stage N2 sleep, 0.0% in stage N3 and 19.4% in REM.  Alpha intrusion was absent.  Supine sleep was 0.00%.  RESPIRATORY PARAMETERS The overall apnea/hypopnea index (AHI) was 4.1 per hour. There were 1 total apneas, including 1 obstructive, 0 central and 0 mixed apneas. There were 27 hypopneas and 4 RERAs.  The AHI during Stage REM sleep was 20.4 per hour.  AHI while supine was N/A per hour.  The mean oxygen saturation was 93.0%. The minimum SpO2 during sleep was 79.0%.  soft snoring was  noted during this study.  CARDIAC DATA The 2 lead EKG demonstrated sinus rhythm. The mean heart rate was 95.8 beats per minute. Other EKG findings include: PVCs.  LEG MOVEMENT DATA The total PLMS were 0 with a resulting PLMS index of 0.0. Associated arousal with leg movement index was 0.0 .  IMPRESSIONS - While he had several obstructive respiratory events these were not frequent enough to qualify for a diagnosis of obstructive sleep apnea.  His overall AHI was 4.1 with an SpO2 low of 79%.  He did have a significant REM effect to these events. - The patient snored with soft snoring volume. - EKG findings include PVCs. - Clinically significant periodic limb movements did not occur during sleep. No significant associated arousals.  DIAGNOSIS - Snoring.  RECOMMENDATIONS - Avoid alcohol, sedatives and other CNS depressants that may worsen sleep apnea and disrupt normal sleep architecture. - Sleep hygiene should be reviewed to assess factors that may improve sleep quality. - Weight management and regular exercise should be initiated or continued if appropriate.  [Electronically signed] 04/05/2022 05:43 PM  Chesley Mires MD, ABSM Diplomate, American Board of Sleep Medicine NPI: [id number]  [place] PH: [phone]   FX: [phone] [place]

## 2022-04-09 NOTE — Progress Notes (Signed)
Office Visit    Patient Name: NICOLA QUESNELL Date of Encounter: 04/10/2022  PCP:  [facility]   [facility]  Cardiologist:  Christell Constant, MD  Advanced Practice Provider:  No care team member to display Electrophysiologist:  None   HPI    HRIDHAAN YOHN is a 56 y.o. male with a past medical history significant for OSA not using CPAP, prior pulmonary embolism, chronic anticoagulation, bipolar presents today for hospital follow-up.  Originally referred for chest pain workup.  Troponin was mildly elevated in the [facility].  Pain comes and goes.  Anterior chest.  Somewhat worse with breathing.  He had a 2-week history of left parasternal pleuritic chest discomfort.  It was made worse with deep breathing.  Discomfort also made worse with laying flat.  For several months he experienced dyspnea on exertion.  Recent emergency room visit led to chest CT with contrast which demonstrated cardiac enlargement and coronary calcification. Nonobstructive CAD.   Cardiovascular risk factors include labile blood pressure, family history of CVA (mother), elevated cholesterol.  Not a diabetic and does not smoke.  Last seen by Dr. Katrinka Blazing 02/26/2022.  Today, he comes in with an acute illness. Endorses body aches, SOB, and pain in his right calf (much like he had a blood clot in the other leg).  He is having shortness of breath.  He is having some weakness on his right side but no loss of vision.  No facial droop noted.  We reviewed his most recent testing which includes a coronary CT back in December as well as an echocardiogram a week or so ago.  The coronary CT scan did not show any obstructive CAD.  His echocardiogram showed severely decreased LVEF of 20 to 25%.  He has severe mitral valve regurgitation. We have referred him to our heart failure team for medication optimization but ultimately may need a referral to [facility] for mitral valve repair/replacement.    Reports no chest pain, pressure, or tightness. No edema, orthopnea, PND. Reports no palpitations.    Past Medical History    Past Medical History:  Diagnosis Date   Achilles rupture, left    Bipolar 1 disorder (HCC)    Dental caries    periodontitis   Pneumonia    Pulmonary embolism (HCC) 05/18/2007   Sleep apnea    wears CPAP   Subdural hematoma (HCC) 01/04/2013   in setting of supratherapeutic INR   Past Surgical History:  Procedure Laterality Date   ACHILLES TENDON SURGERY Left 10/21/2014   Procedure: Left Achilles Reconstruction;  Surgeon: Nadara Mustard, MD;  Location: [facility] OR;  Service: Orthopedics;  Laterality: Left;   APPENDECTOMY     CARDIAC CATHETERIZATION  03/04/2018   [facility]: Normal coronaries, LVEF estimated at 40%, medical Rx   CRANIOTOMY N/A 01/19/2013   Procedure: CRANIOTOMY HEMATOMA EVACUATION SUBDURAL;  Surgeon: Hewitt Shorts, MD;  Location: [facility];  Service: Neurosurgery;  Laterality: N/A;   CYSTECTOMY     right head   ELBOW SURGERY     right   FRACTURE SURGERY     finger   I & D EXTREMITY Left 12/07/2016   Procedure: LEFT ACHILLES DEBRIDEMENT;  Surgeon: Nadara Mustard, MD;  Location: [facility];  Service: Orthopedics;  Laterality: Left;   LUMBAR FUSION  12/23/2017   L5 GILL PROCEDURE, RIGHT L5-S1, TRANSFORAMIAL LUMBAR INTERBODY FUSION, BILATERAL LATERAL FUSION, PEDICLE INSTRUMENTATION   MULTIPLE EXTRACTIONS WITH ALVEOLOPLASTY N/A 03/07/2015  Procedure: MULTIPLE EXTRACTION WITH ALVEOLOPLASTY;  Surgeon: [name], DDS;  Location: [facility] OR;  Service: Oral Surgery;  Laterality: N/A;   SPINAL CORD STIMULATOR INSERTION N/A 05/18/2019   Procedure: LUMBAR SPINAL CORD STIMULATOR INSERTION;  Surgeon: Odette Fraction, MD;  Location: [facility] OR;  Service: Neurosurgery;  Laterality: N/A;  Thoracic/Lumbar   SPINAL CORD STIMULATOR INSERTION N/A 04/06/2020   Procedure: Revision of spinal cord stimulator;  Surgeon: Renaldo Fiddler, MD;  Location: [facility] OR;  Service:  Neurosurgery;  Laterality: N/A;    Allergies  Allergies  Allergen Reactions   Lyrica [Pregabalin] Other (See Comments)    Hallucinations      EKGs/Labs/Other Studies Reviewed:   The following studies were reviewed today:  Coronary CTA 03/02/2022 IMPRESSION: 1. Coronary calcium score of 49. This was 80th percentile for age-, sex, and race-matched controls.   2. Mild CAD (25-49%) in the LAD/LCX.   3. Minimal CAD (<25%) in the RCA.   4. Severely dilated left ventricle up to 70 mm. Echocardiogram recommended.   5. Dilated pulmonary artery suggestive of pulmonary hypertension.   RECOMMENDATIONS: 1. Mild non-obstructive CAD (25-49%). Consider non-atherosclerotic causes of chest pain. Consider preventive therapy and risk factor modification.  Echocardiogram 03/29/2022  IMPRESSIONS     1. Left ventricular ejection fraction, by estimation, is 20 to 25%. The  left ventricle has severely decreased function. The left ventricle  demonstrates global hypokinesis. The left ventricular internal cavity size  was severely dilated. Indeterminate  diastolic filling due to E-A fusion.   2. Right ventricular systolic function is normal. The right ventricular  size is normal. There is normal pulmonary artery systolic pressure.   3. Left atrial size was severely dilated.   4. The mitral valve is abnormal in structure. Significant functional MR  related to MV teathering from LV dilatation and LV dysfunction. Moderate  to severe mitral valve regurgitation. No evidence of mitral stenosis. MR  EROA 0.25cm2, MR vol 33cc, MR  radius 0.8cm   5. The aortic valve is tricuspid. Aortic valve regurgitation is mild.  Aortic valve sclerosis is present, with no evidence of aortic valve  stenosis.   6. The inferior vena cava is normal in size with greater than 50%  respiratory variability, suggesting right atrial pressure of 3 mmHg.   7. Consider TEE for further assessment of degree of MR.    FINDINGS   Left Ventricle: Left ventricular ejection fraction, by estimation, is 20  to 25%. The left ventricle has severely decreased function. The left  ventricle demonstrates global hypokinesis. 3D left ventricular ejection  fraction analysis performed but not  reported based on interpreter judgement due to suboptimal tracking. The  left ventricular internal cavity size was severely dilated. There is no  left ventricular hypertrophy. Indeterminate diastolic filling due to E-A  fusion. Normal left ventricular  filling pressure.   Right Ventricle: The right ventricular size is normal. No increase in  right ventricular wall thickness. Right ventricular systolic function is  normal. There is normal pulmonary artery systolic pressure. The tricuspid  regurgitant velocity is 1.68 m/s, and   with an assumed right atrial pressure of 3 mmHg, the estimated right  ventricular systolic pressure is 14.3 mmHg.   Left Atrium: Left atrial size was severely dilated.   Right Atrium: Right atrial size was normal in size.   Pericardium: There is no evidence of pericardial effusion.   Mitral Valve: The mitral valve is abnormal. Moderate to severe mitral  valve regurgitation. No evidence of mitral valve  stenosis.   Tricuspid Valve: The tricuspid valve is normal in structure. Tricuspid  valve regurgitation is trivial. No evidence of tricuspid stenosis.   Aortic Valve: The aortic valve is tricuspid. Aortic valve regurgitation is  mild. Aortic valve sclerosis is present, with no evidence of aortic valve  stenosis.   Pulmonic Valve: The pulmonic valve was normal in structure. Pulmonic valve  regurgitation is trivial. No evidence of pulmonic stenosis.   Aorta: The aortic root is normal in size and structure.   Venous: The inferior vena cava is normal in size with greater than 50%  respiratory variability, suggesting right atrial pressure of 3 mmHg.   IAS/Shunts: No atrial level shunt detected by  color flow Doppler.   EKG:  EKG is not ordered today.    Recent Labs: 12/08/2021: B Natriuretic Peptide 23.4 12/24/2021: Magnesium 2.1 01/19/2022: ALT 15 02/16/2022: TSH 0.52 02/26/2022: NT-Pro BNP 547 03/03/2022: BUN 6; Creatinine, Ser 1.15; Hemoglobin 12.1; Platelets 269; Potassium 3.8; Sodium 139  Recent Lipid Panel    Component Value Date/Time   CHOL 220 (H) 10/11/2021 0000   TRIG 224 (H) 10/11/2021 0000   HDL 57 10/11/2021 0000   CHOLHDL 3.9 10/11/2021 0000   LDLCALC 127 (H) 10/11/2021 0000    Home Medications   Current Meds  Medication Sig   amoxicillin-clavulanate (AUGMENTIN) 875-125 MG tablet Take 1 tablet by mouth 2 (two) times daily.   azithromycin (ZITHROMAX Z-PAK) 250 MG tablet Take two tablets (500mg ) on the first day followed by 1 tablet (250 mg) on the next four days for a total of five days of therapy   dabigatran (PRADAXA) 150 MG CAPS capsule Take 1 capsule (150 mg total) by mouth 2 (two) times daily.   diclofenac Sodium (VOLTAREN) 1 % GEL Apply 4 g topically 4 (four) times daily as needed (pain).   doxepin (SINEQUAN) 75 MG capsule Take 75 mg by mouth at bedtime.   HYDROcodone-acetaminophen (NORCO) 7.5-325 MG tablet Take 0.5-1 tablets by mouth every 8 (eight) hours as needed for moderate pain or severe pain.   lithium 600 MG capsule Take 600 mg by mouth at bedtime.   mirtazapine (REMERON) 45 MG tablet Take 45 mg by mouth at bedtime.   QUEtiapine (SEROQUEL XR) 400 MG 24 hr tablet Take 800 mg by mouth at bedtime.   tiZANidine (ZANAFLEX) 4 MG tablet Take 4 mg by mouth at bedtime.      Review of Systems      All other systems reviewed and are otherwise negative except as noted above.  Physical Exam    VS:  BP 132/83   Pulse (!) 114   Temp (!) 100.9 F (38.3 C)   Ht 5\' 9"  (1.753 m)   Wt 224 lb (101.6 kg)   SpO2 94%   BMI 33.08 kg/m  , BMI Body mass index is 33.08 kg/m.  Wt Readings from Last 3 Encounters:  04/10/22 224 lb (101.6 kg)  04/04/22 230 lb  (104.3 kg)  03/15/22 220 lb 9.6 oz (100.1 kg)     GEN: Well nourished, well developed, in no acute distress. HEENT: normal. Neck: Supple, no JVD, carotid bruits, or masses. Cardiac: RRR, no murmurs, rubs, or gallops. No clubbing, cyanosis, edema.  Radials/PT 2+ and equal bilaterally.  Respiratory:  Respirations regular and unlabored, rhonchi right lower lobe GI: Soft, nontender, nondistended. MS: No deformity or atrophy. Skin: Warm and dry, no rash. Neuro:  Strength and sensation are intact. Psych: Normal affect.  Assessment & Plan  Pneumonia vs. Bronchitis -congestion on auscultation -tachycardia today, rate 112 bpm -fever/chills -muscle weakness/body aches -asked him to see his pcp or urgent care for treatment  Pulmonary embolism/Hx of DVT -on chronic Pradaxa -no with pain in the other calf -will order a bilateral LE [place]  Nonischemic CM/HFrEF -recent coronary CT reviewed with nonobstructive CAD -EF 20-25% , severe MR -Referral to HF -in the setting of acute illness, I elected not to start any medications at this time -euvolemic on exam today -ultimately may need referral to TCTS for surgical management of his mitral valve  OSA -just had a sleep study but results not available  -He has not used his CPAP and cannot tolerate the mask  DOE -having issues, multifactorial  -associated with weakness/body aches -infection likely contributing       Disposition: Follow up 3 months with Werner Lean, MD or APP. ASAP follow-up with PCP/urgent care ASAP follow-up with CHF clinic  Signed, Elgie Collard, PA-C 04/10/2022, 11:01 AM [place]

## 2022-04-10 ENCOUNTER — Ambulatory Visit (HOSPITAL_COMMUNITY)
Admission: EM | Admit: 2022-04-10 | Discharge: 2022-04-10 | Disposition: A | Discharge status: Home / Self Care | Payer: Commercial Managed Care - HMO | Attending: Family Medicine | Admitting: Family Medicine

## 2022-04-10 ENCOUNTER — Ambulatory Visit (INDEPENDENT_AMBULATORY_CARE_PROVIDER_SITE_OTHER): Payer: Commercial Managed Care - HMO

## 2022-04-10 ENCOUNTER — Encounter (HOSPITAL_COMMUNITY): Payer: Self-pay

## 2022-04-10 ENCOUNTER — Ambulatory Visit: Payer: Commercial Managed Care - HMO | Attending: Physician Assistant | Admitting: Physician Assistant

## 2022-04-10 ENCOUNTER — Encounter: Payer: Self-pay | Admitting: Physician Assistant

## 2022-04-10 VITALS — BP 132/83 | HR 114 | Temp 100.9°F | Ht 69.0 in | Wt 224.0 lb

## 2022-04-10 DIAGNOSIS — S065XAA Traumatic subdural hemorrhage with loss of consciousness status unknown, initial encounter: Secondary | ICD-10-CM

## 2022-04-10 DIAGNOSIS — R059 Cough, unspecified: Secondary | ICD-10-CM | POA: Insufficient documentation

## 2022-04-10 DIAGNOSIS — R0602 Shortness of breath: Secondary | ICD-10-CM | POA: Insufficient documentation

## 2022-04-10 DIAGNOSIS — Z5320 Procedure and treatment not carried out because of patient's decision for unspecified reasons: Secondary | ICD-10-CM

## 2022-04-10 DIAGNOSIS — J069 Acute upper respiratory infection, unspecified: Secondary | ICD-10-CM | POA: Diagnosis not present

## 2022-04-10 DIAGNOSIS — S065XAS Traumatic subdural hemorrhage with loss of consciousness status unknown, sequela: Secondary | ICD-10-CM

## 2022-04-10 DIAGNOSIS — I517 Cardiomegaly: Secondary | ICD-10-CM | POA: Diagnosis not present

## 2022-04-10 DIAGNOSIS — R079 Chest pain, unspecified: Secondary | ICD-10-CM

## 2022-04-10 DIAGNOSIS — I428 Other cardiomyopathies: Secondary | ICD-10-CM

## 2022-04-10 DIAGNOSIS — R0609 Other forms of dyspnea: Secondary | ICD-10-CM

## 2022-04-10 DIAGNOSIS — I82451 Acute embolism and thrombosis of right peroneal vein: Secondary | ICD-10-CM

## 2022-04-10 DIAGNOSIS — Z1152 Encounter for screening for COVID-19: Secondary | ICD-10-CM | POA: Insufficient documentation

## 2022-04-10 DIAGNOSIS — G4733 Obstructive sleep apnea (adult) (pediatric): Secondary | ICD-10-CM

## 2022-04-10 DIAGNOSIS — Z86718 Personal history of other venous thrombosis and embolism: Secondary | ICD-10-CM

## 2022-04-10 DIAGNOSIS — Z7901 Long term (current) use of anticoagulants: Secondary | ICD-10-CM

## 2022-04-10 DIAGNOSIS — I2699 Other pulmonary embolism without acute cor pulmonale: Secondary | ICD-10-CM

## 2022-04-10 LAB — POC INFLUENZA A AND B ANTIGEN (URGENT CARE ONLY)
Influenza A Ag: NEGATIVE
Influenza B Ag: NEGATIVE

## 2022-04-10 NOTE — Patient Instructions (Addendum)
Medication Instructions:  Your physician recommends that you continue on your current medications as directed. Please refer to the Current Medication list given to you today.  *If you need a refill on your cardiac medications before your next appointment, please call your pharmacy*   Lab Work: None If you have labs (blood work) drawn today and your tests are completely normal, you will receive your results only by: [place] (if you have MyChart) OR A paper copy in the mail If you have any lab test that is abnormal or we need to change your treatment, we will call you to review the results.   Testing/Procedures: Your physician has requested that you have a lower or upper extremity arterial duplex. This test is an ultrasound of the arteries in the legs or arms. It looks at arterial blood flow in the legs and arms. Allow one hour for Lower and Upper Arterial scans. There are no restrictions or special instructions    Follow-Up: At [facility], you and your health needs are our priority.  As part of our continuing mission to provide you with exceptional heart care, we have created designated Provider Care Teams.  These Care Teams include your primary Cardiologist (physician) and Advanced Practice Providers (APPs -  Physician Assistants and Nurse Practitioners) who all work together to provide you with the care you need, when you need it.  Your next appointment:   3 month(s)  Provider:   Dr Gasper Sells  Other Instructions You have been referred to the CHF clinic Go to urgent care or your primary care providers office today to be seen as we discussed

## 2022-04-10 NOTE — ED Triage Notes (Signed)
Pt is here for cough and SOB x 1days

## 2022-04-10 NOTE — Discharge Instructions (Signed)
Your chest x-ray report did not show any pneumonia or fluid in the lung tissues.  Your flu test was negative.   You have been swabbed for COVID, and the test will result in the next 24 hours. Our staff will call you if positive. If the COVID test is positive, you should quarantine for 5 days from the start of your symptoms.  On days 6-10 from the start of your illness, you should wear a mask if out in public.

## 2022-04-10 NOTE — ED Provider Notes (Signed)
[facility]    CSN: [id number] Arrival date & time: 04/10/22  1114      History   Chief Complaint Chief Complaint  Patient presents with   Cough    HPI Justin [name] is a 57 y.o. male.    Cough  Here for "pneumonia"  Patient comes in today with cough and shortness of breath that began yesterday.  He was seen earlier today at cardiology, and his temperature there was 100.9.  It is 98 here at urgent care at triage.  No nausea or vomiting or diarrhea.  No nasal congestion or rhinorrhea.  He states he feels very weak and that he has had pneumonia a lot of times.  He also relates that cardiology listen to his lungs and said that he had pneumonia  Was seen in the emergency room in the middle of December--diagnosis at the time of discharge was pneumonia and he was treated with Augmentin and a Z-Pak.  Apparently symptoms did improve.  Of note the radiology report states he had pulmonary congestion and does not note any consolidation or infiltrate.  Cardiology note from today is reviewed.  Patient does have a severely reduced ejection fraction of 20 to 25%, and he has been referred to their heart failure team.  They noted abnormal lung exam and asked him to be seen for urgent treatment for possible pneumonia versus bronchitis.  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   Leukocytosis 12/22/2021   SIRS (systemic inflammatory response syndrome) (HCC) 12/22/2021   Normocytic anemia 12/22/2021   Hematoma 12/09/2021   Acute deep vein thrombosis (DVT) of right  peroneal vein (HCC) 10/19/2021   History of pulmonary embolism 10/19/2021   Status post lumbar spinal fusion 01/03/2018   Lumbar stenosis 12/23/2017   Spondylolisthesis, lumbar region 06/25/2017   Achilles tendinitis of left lower extremity 12/07/2016   Achilles tendinitis, left leg 05/17/2016   Obstructive sleep apnea on CPAP 03/07/2015   Post-operative state 03/07/2015   Achilles rupture, left 10/21/2014   OSA (obstructive sleep apnea) 04/23/2013   Bipolar disorder, unspecified (HCC) 01/07/2013   Sinus tachycardia 01/07/2013   Headache(784.0) 01/07/2013   SDH (subdural hematoma) (HCC) 01/04/2013   Pulmonary embolism (HCC) 01/04/2013   Chronic anticoagulation 01/04/2013   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   Acute sinusitis 01/04/2013    Past Surgical History:  Procedure Laterality Date   ACHILLES TENDON SURGERY Left 10/21/2014   Procedure: Left Achilles Reconstruction;  Surgeon: Nadara Mustard, MD;  Location: [facility] OR;  Service: Orthopedics;  Laterality: Left;   APPENDECTOMY     CARDIAC CATHETERIZATION  03/04/2018   [facility]: Normal coronaries, LVEF estimated at 40%, medical Rx   CRANIOTOMY N/A 01/19/2013   Procedure: CRANIOTOMY HEMATOMA EVACUATION SUBDURAL;  Surgeon: Hewitt Shorts, MD;  Location: [facility];  Service: Neurosurgery;  Laterality: N/A;   CYSTECTOMY     right head   ELBOW SURGERY     right   FRACTURE SURGERY  finger   I & D EXTREMITY Left 12/07/2016   Procedure: LEFT ACHILLES DEBRIDEMENT;  Surgeon: Nadara Mustard, MD;  Location: [facility] OR;  Service: Orthopedics;  Laterality: Left;   LUMBAR FUSION  12/23/2017   L5 GILL PROCEDURE, RIGHT L5-S1, TRANSFORAMIAL LUMBAR INTERBODY FUSION, BILATERAL LATERAL FUSION, PEDICLE INSTRUMENTATION   MULTIPLE EXTRACTIONS WITH ALVEOLOPLASTY N/A 03/07/2015   Procedure: MULTIPLE EXTRACTION WITH ALVEOLOPLASTY;  Surgeon: [name], DDS;  Location: [facility] OR;  Service: Oral Surgery;  Laterality: N/A;   SPINAL CORD  STIMULATOR INSERTION N/A 05/18/2019   Procedure: LUMBAR SPINAL CORD STIMULATOR INSERTION;  Surgeon: Odette Fraction, MD;  Location[facility] OR;  Service: Neurosurgery;  Laterality: N/A;  Thoracic/Lumbar   SPINAL CORD STIMULATOR INSERTION N/A 04/06/2020   Procedure: Revision of spinal cord stimulator;  Surgeon: Renaldo Fiddler, MD;  Location: [facility] OR;  Service: Neurosurgery;  Laterality: N/A;       Home Medications    Prior to Admission medications   Medication Sig Start Date End Date Taking? Authorizing Provider  dabigatran (PRADAXA) 150 MG CAPS capsule Take 1 capsule (150 mg total) by mouth 2 (two) times daily. 01/19/22  Yes Georga Kaufmann T, PA-C  doxepin (SINEQUAN) 75 MG capsule Take 75 mg by mouth at bedtime. 12/14/19  Yes [provider]  lithium 600 MG capsule Take 600 mg by mouth at bedtime.   Yes [provider]  mirtazapine (REMERON) 45 MG tablet Take 45 mg by mouth at bedtime.   Yes [provider]  QUEtiapine (SEROQUEL XR) 400 MG 24 hr tablet Take 800 mg by mouth at bedtime. 11/16/21  Yes [provider]  tiZANidine (ZANAFLEX) 4 MG tablet Take 4 mg by mouth at bedtime.  12/17/19  Yes [provider]    Family History Family History  Problem Relation Age of Onset   Hypertension Mother    Stroke Mother    Multiple sclerosis Sister    Down syndrome Son     Social History Social History   Tobacco Use   Smoking status: Never   Smokeless tobacco: Never  Vaping Use   Vaping Use: Never used  Substance Use Topics   Alcohol use: No   Drug use: No     Allergies   Lyrica [pregabalin]   Review of Systems Review of Systems  Respiratory:  Positive for cough.      Physical Exam Triage Vital Signs ED Triage Vitals  Enc Vitals Group     BP 04/10/22 1247 125/82     Pulse Rate 04/10/22 1247 62     Resp 04/10/22 1247 16     Temp 04/10/22 1247 98 F (36.7 C)     Temp Source 04/10/22 1247 Oral     SpO2 04/10/22 1247 98 %     Weight --       Height --      Head Circumference --      Peak Flow --      Pain Score 04/10/22 1246 0     Pain Loc --      Pain Edu? --      Excl. in GC? --    No data found.  Updated Vital Signs BP 125/82 (BP Location: Left Arm)   Pulse 62   Temp 98 F (36.7 C) (Oral)   Resp 16   SpO2 98%   Visual Acuity Right Eye Distance:   Left Eye Distance:   Bilateral Distance:    Right Eye Near:   Left Eye Near:  Bilateral Near:     Physical Exam Vitals reviewed.  Constitutional:      General: He is not in acute distress.    Appearance: He is not ill-appearing or toxic-appearing.  HENT:     Right Ear: Tympanic membrane and ear canal normal.     Left Ear: Tympanic membrane and ear canal normal.     Nose: Nose normal.     Mouth/Throat:     Mouth: Mucous membranes are moist.     Pharynx: No oropharyngeal exudate or posterior oropharyngeal erythema.  Eyes:     Extraocular Movements: Extraocular movements intact.     Conjunctiva/sclera: Conjunctivae normal.     Pupils: Pupils are equal, round, and reactive to light.  Cardiovascular:     Rate and Rhythm: Normal rate and regular rhythm.     Heart sounds: No murmur heard. Pulmonary:     Effort: No respiratory distress.     Breath sounds: No stridor. No wheezing or rhonchi.     Comments: I do hear rales in the lower lung fields bilaterally Musculoskeletal:     Cervical back: Neck supple.  Lymphadenopathy:     Cervical: No cervical adenopathy.  Skin:    Capillary Refill: Capillary refill takes less than 2 seconds.     Coloration: Skin is not jaundiced or pale.  Neurological:     General: No focal deficit present.     Mental Status: He is alert and oriented to person, place, and time.  Psychiatric:        Behavior: Behavior normal.      UC Treatments / Results  Labs (all labs ordered are listed, but only abnormal results are displayed) Labs Reviewed  SARS CORONAVIRUS 2 (TAT 6-24 HRS)  POC INFLUENZA A AND B ANTIGEN (URGENT  CARE ONLY)    EKG   Radiology DG Chest 2 View  Result Date: 04/10/2022 CLINICAL DATA:  Cough and shortness of breath for the past day. EXAM: CHEST - 2 VIEW COMPARISON:  Chest x-ray dated March 03, 2022. FINDINGS: Unchanged mild cardiomegaly. Normal pulmonary vascularity. Unchanged scarring in the lingula. Chronic blunting of the right costophrenic angle. No focal consolidation, pleural effusion, or pneumothorax. No acute osseous abnormality. IMPRESSION: 1. No acute cardiopulmonary disease. Electronically Signed   By: Titus Dubin M.D.   On: 04/10/2022 13:35    Procedures Procedures (including critical care time)  Medications Ordered in UC Medications - No data to display  Initial Impression / Assessment and Plan / UC Course  I have reviewed the triage vital signs and the nursing notes.  Pertinent labs & imaging results that were available during my care of the patient were reviewed by me and considered in my medical decision making (see chart for details).       Chest x-ray does not show any infiltrate.  And currently does not show any pulmonary congestion either.  There are some chronic changes and scarring noted.  Point-of-care flu test is negative; we are out of the PCR flu test kits.  COVID swab is done today, and if positive he is a candidate for molnupiravir.  He cannot take Paxlovid due to interaction with his Pradaxa. Final Clinical Impressions(s) / UC Diagnoses   Final diagnoses:  Viral URI with cough     Discharge Instructions      Your chest x-ray report did not show any pneumonia or fluid in the lung tissues.  Your flu test was negative.   You have been swabbed for COVID, and the  test will result in the next 24 hours. Our staff will call you if positive. If the COVID test is positive, you should quarantine for 5 days from the start of your symptoms.  On days 6-10 from the start of your illness, you should wear a mask if out in public.      ED  Prescriptions   None    PDMP not reviewed this encounter.   [name], MD 04/10/22 [phone]

## 2022-04-10 NOTE — Addendum Note (Signed)
Addended by: Juventino Slovak on: 04/10/2022 03:55 PM   Modules accepted: Orders

## 2022-04-11 LAB — SARS CORONAVIRUS 2 (TAT 6-24 HRS): SARS Coronavirus 2: NEGATIVE

## 2022-04-20 ENCOUNTER — Inpatient Hospital Stay (HOSPITAL_BASED_OUTPATIENT_CLINIC_OR_DEPARTMENT_OTHER): Payer: 59 | Admitting: Hematology and Oncology

## 2022-04-20 ENCOUNTER — Other Ambulatory Visit: Payer: Self-pay

## 2022-04-20 ENCOUNTER — Inpatient Hospital Stay: Payer: 59 | Attending: Physician Assistant

## 2022-04-20 VITALS — BP 116/81 | HR 91 | Temp 98.3°F | Resp 17 | Wt 224.7 lb

## 2022-04-20 DIAGNOSIS — Z86711 Personal history of pulmonary embolism: Secondary | ICD-10-CM

## 2022-04-20 DIAGNOSIS — Z7901 Long term (current) use of anticoagulants: Secondary | ICD-10-CM | POA: Diagnosis not present

## 2022-04-20 DIAGNOSIS — I82451 Acute embolism and thrombosis of right peroneal vein: Secondary | ICD-10-CM | POA: Diagnosis not present

## 2022-04-20 LAB — CMP (CANCER CENTER ONLY)
ALT: 10 U/L (ref 0–44)
AST: 11 U/L — ABNORMAL LOW (ref 15–41)
Albumin: 3.8 g/dL (ref 3.5–5.0)
Alkaline Phosphatase: 77 U/L (ref 38–126)
Anion gap: 7 (ref 5–15)
BUN: 6 mg/dL (ref 6–20)
CO2: 28 mmol/L (ref 22–32)
Calcium: 9 mg/dL (ref 8.9–10.3)
Chloride: 107 mmol/L (ref 98–111)
Creatinine: 1.16 mg/dL (ref 0.61–1.24)
GFR, Estimated: >60 mL/min (ref >60)
Glucose, Bld: 150 mg/dL — ABNORMAL HIGH (ref 70–99)
Potassium: 4 mmol/L (ref 3.5–5.1)
Sodium: 142 mmol/L (ref 135–145)
Total Bilirubin: 0.4 mg/dL (ref 0.3–1.2)
Total Protein: 6.5 g/dL (ref 6.5–8.1)

## 2022-04-20 LAB — CBC WITH DIFFERENTIAL (CANCER CENTER ONLY)
Abs Immature Granulocytes: 0.04 10*3/uL (ref 0.00–0.07)
Basophils Absolute: 0.1 10*3/uL (ref 0.0–0.1)
Basophils Relative: 1 %
Eosinophils Absolute: 0.2 10*3/uL (ref 0.0–0.5)
Eosinophils Relative: 3 %
HCT: 37.1 % — ABNORMAL LOW (ref 39.0–52.0)
Hemoglobin: 12.5 g/dL — ABNORMAL LOW (ref 13.0–17.0)
Immature Granulocytes: 1 %
Lymphocytes Relative: 28 %
Lymphs Abs: 2.1 10*3/uL (ref 0.7–4.0)
MCH: 28.2 pg (ref 26.0–34.0)
MCHC: 33.7 g/dL (ref 30.0–36.0)
MCV: 83.6 fL (ref 80.0–100.0)
Monocytes Absolute: 0.5 10*3/uL (ref 0.1–1.0)
Monocytes Relative: 6 %
Neutro Abs: 4.6 10*3/uL (ref 1.7–7.7)
Neutrophils Relative %: 61 %
Platelet Count: 306 10*3/uL (ref 150–400)
RBC: 4.44 MIL/uL (ref 4.22–5.81)
RDW: 15.4 % (ref 11.5–15.5)
WBC Count: 7.5 10*3/uL (ref 4.0–10.5)
nRBC: 0 % (ref 0.0–0.2)

## 2022-04-20 NOTE — Progress Notes (Signed)
[place] [phone]   [phone]  PROGRESS NOTE  Patient Care Team: Pa, [facility] as PCP - General (Internal Medicine) Werner Lean, MD as PCP - Cardiology (Cardiology)  Hematological/Oncological History 1) March 2009: Diagnosed with pulmonary embolism with right heart strain. Started on anticoagulation with coumadin 2) October 2014: Developed subdural hematoma. INR 5.07 while on coumadin. Neurosurgery requested to hold anticoagulation. Coagulapathy was reversed with vitamin K and FFP. IV filter was placed by IR.  3) November 2014: Underwent right front parietal craniotomy with subdural hematoma evacuation. 4) 09/23/2021: While on Eliquis therapy (duration unknown), developed acute DVT in right peroneal veins. Switch to Lovenox 1 mg/kg twice daily 5) 10/12/2021: Returned to ED for worsening right leg pain. Repeat doppler US showed new clot progression further into the left peroneal vein as compared to 09/23/2021 study.  6) 10/18/2021: Establish care with [facility] Hematology with Dr. Lorenso Courier and Dede Query PA-C  -Labs showed  elevated beta-2-glycoprotein antibodies   -Switched to coumadin and INR levels managed by PCP.  7) 12/08/2021-12/10/2021: Admitted for spontaneous large left thigh intramuscular hematoma in the setting of supratherapeutic INR greater than 7.  Coumadin was discontinued and patient was switched to Eliquis.   CHIEF COMPLAINTS/PURPOSE OF CONSULTATION:  "History of PE and recent right lower extremity DVT "  HISTORY OF PRESENTING ILLNESS:  Justin [name] 57 y.o. male returns for a follow up for history of PE/DVT. He was last seen on 10/18/2021.  In the interim since her last visit he transitioned again to Pradaxa therapy twice daily.  On exam today, Mr. Spragg reports he has been struggling with back pain in the interim since her last visit.  He underwent an MRI for his back pain and a clear source was not found.  He was started on  7.5 mg of hydrocodone and it is "not helping".  He is currently following with pain management at [facility].  He is now taking Pradaxa twice daily.  He reports he is not having any issues with overt bleeding or dark stools.  He does have some occasional bruising on his left leg.  He reports that he does occasionally feel some discomfort on his left leg but overall his symptoms are improved.  Otherwise he is had no other major changes in his health.  He denies fevers, chills, sweats, shortness of breath, chest pain or cough. He has no other complaints.  Rest of the 10 point ROS is below.  MEDICAL HISTORY:  Past Medical History:  Diagnosis Date   Achilles rupture, left    Bipolar 1 disorder ([place])    Dental caries    periodontitis   Pneumonia    Pulmonary embolism ([place]) 05/18/2007   Sleep apnea    wears CPAP   Subdural hematoma ([place]) 01/04/2013   in setting of supratherapeutic INR    SURGICAL HISTORY: Past Surgical History:  Procedure Laterality Date   ACHILLES TENDON SURGERY Left 10/21/2014   Procedure: Left Achilles Reconstruction;  Surgeon: Newt Minion, MD;  Location: [place];  Service: Orthopedics;  Laterality: Left;   APPENDECTOMY     CARDIAC CATHETERIZATION  03/04/2018   [facility]: Normal coronaries, LVEF estimated at 40%, medical Rx   CRANIOTOMY N/A 01/19/2013   Procedure: CRANIOTOMY HEMATOMA EVACUATION SUBDURAL;  Surgeon: Hosie Spangle, MD;  Location: [place] [facility];  Service: Neurosurgery;  Laterality: N/A;   CYSTECTOMY     right head   ELBOW SURGERY     right  FRACTURE SURGERY     finger   I & D EXTREMITY Left 12/07/2016   Procedure: LEFT ACHILLES DEBRIDEMENT;  Surgeon: Newt Minion, MD;  Location: [place];  Service: Orthopedics;  Laterality: Left;   LUMBAR FUSION  12/23/2017   L5 GILL PROCEDURE, RIGHT L5-S1, TRANSFORAMIAL LUMBAR INTERBODY FUSION, BILATERAL LATERAL FUSION, PEDICLE INSTRUMENTATION   MULTIPLE EXTRACTIONS WITH ALVEOLOPLASTY N/A 03/07/2015    Procedure: MULTIPLE EXTRACTION WITH ALVEOLOPLASTY;  Surgeon: Diona Browner, DDS;  Location: [place];  Service: Oral Surgery;  Laterality: N/A;   SPINAL CORD STIMULATOR INSERTION N/A 05/18/2019   Procedure: LUMBAR SPINAL CORD STIMULATOR INSERTION;  Surgeon: Clydell Hakim, MD;  Location: [place];  Service: Neurosurgery;  Laterality: N/A;  Thoracic/Lumbar   SPINAL CORD STIMULATOR INSERTION N/A 04/06/2020   Procedure: Revision of spinal cord stimulator;  Surgeon: Reece Agar, MD;  Location: [place];  Service: Neurosurgery;  Laterality: N/A;    SOCIAL HISTORY: Social History   Socioeconomic History   Marital status: Single    Spouse name: Not on file   Number of children: Not on file   Years of education: Not on file   Highest education level: Not on file  Occupational History   Occupation: disability  Tobacco Use   Smoking status: Never   Smokeless tobacco: Never  Vaping Use   Vaping Use: Never used  Substance and Sexual Activity   Alcohol use: No   Drug use: No   Sexual activity: Not on file  Other Topics Concern   Not on file  Social History Narrative   FROM [place], [place]    Lives in a 2 story apartment.  His cousin is currently staying with him.  Has 4 children.  On disability for the past 10 years for pulmonary embolism, bipolar disease.  Education: high school.   Social Determinants of Health   Financial Resource Strain: Not on file  Food Insecurity: No Food Insecurity (12/22/2021)   Hunger Vital Sign    Worried About Running Out of Food in the Last Year: Never true    Ran Out of Food in the Last Year: Never true  Transportation Needs: No Transportation Needs (12/22/2021)   PRAPARE - Hydrologist (Medical): No    Lack of Transportation (Non-Medical): No  Physical Activity: Not on file  Stress: Not on file  Social Connections: Not on file  Intimate Partner Violence: Not At Risk (12/22/2021)   Humiliation, Afraid, Rape, and Kick questionnaire    Fear  of Current or Ex-Partner: No    Emotionally Abused: No    Physically Abused: No    Sexually Abused: No    FAMILY HISTORY: Family History  Problem Relation Age of Onset   Hypertension Mother    Stroke Mother    Multiple sclerosis Sister    Down syndrome Son     ALLERGIES:  is allergic to lyrica [pregabalin].  MEDICATIONS:  Current Outpatient Medications  Medication Sig Dispense Refill   dabigatran (PRADAXA) 150 MG CAPS capsule Take 1 capsule (150 mg total) by mouth 2 (two) times daily. 180 capsule 0   doxepin (SINEQUAN) 75 MG capsule Take 75 mg by mouth at bedtime.     lithium 600 MG capsule Take 600 mg by mouth at bedtime.     mirtazapine (REMERON) 45 MG tablet Take 45 mg by mouth at bedtime.     QUEtiapine (SEROQUEL XR) 400 MG 24 hr tablet Take 800 mg by mouth at bedtime.  tiZANidine (ZANAFLEX) 4 MG tablet Take 4 mg by mouth at bedtime.      empagliflozin (JARDIANCE) 10 MG TABS tablet Take 1 tablet (10 mg total) by mouth daily before breakfast. 30 tablet 11   sacubitril-valsartan (ENTRESTO) 49-51 MG Take 1 tablet by mouth 2 (two) times daily. 60 tablet 11   No current facility-administered medications for this visit.    REVIEW OF SYSTEMS:   Constitutional: ( - ) fevers, ( - )  chills , ( - ) night sweats Eyes: ( - ) blurriness of vision, ( - ) double vision, ( - ) watery eyes Ears, nose, mouth, throat, and face: ( - ) mucositis, ( - ) sore throat Respiratory: ( - ) cough, ( - ) dyspnea, ( - ) wheezes Cardiovascular: ( - ) palpitation, ( +) chest discomfort, ( + ) lower extremity swelling Gastrointestinal:  ( - ) nausea, ( - ) heartburn, ( - ) change in bowel habits Skin: ( - ) abnormal skin rashes Lymphatics: ( - ) new lymphadenopathy, ( - ) easy bruising Neurological: ( - ) numbness, ( - ) tingling, ( - ) new weaknesses Behavioral/Psych: ( - ) mood change, ( - ) new changes  All other systems were reviewed with the patient and are negative.  PHYSICAL  EXAMINATION: ECOG PERFORMANCE STATUS: 1 - Symptomatic but completely ambulatory  Vitals:   04/20/22 1514  BP: 116/81  Pulse: 91  Resp: 17  Temp: 98.3 F (36.8 C)  SpO2: 94%    Filed Weights   04/20/22 1514  Weight: 224 lb 11.2 oz (101.9 kg)     GENERAL: well appearing male in NAD  SKIN: skin color, texture, turgor are normal, no rashes or significant lesions EYES: conjunctiva are pink and non-injected, sclera clear NECK: supple, non-tender LUNGS: clear to auscultation and percussion with normal breathing effort HEART: regular rate & rhythm and no murmurs.  Musculoskeletal: no cyanosis of digits and no clubbing  PSYCH: alert & oriented x 3, fluent speech NEURO: no focal motor/sensory deficits  LABORATORY DATA:  I have reviewed the data as listed    Latest Ref Rng & Units 04/27/2022    4:05 PM 04/20/2022    2:18 PM 03/03/2022    1:51 PM  CBC  WBC 4.0 - 10.5 K/uL 9.8  7.5  12.1   Hemoglobin 13.0 - 17.0 g/dL 12.1  12.5  12.1   Hematocrit 39.0 - 52.0 % 37.1  37.1  36.5   Platelets 150 - 400 K/uL 319  306  269        Latest Ref Rng & Units 04/27/2022    4:05 PM 04/20/2022    2:18 PM 03/03/2022    1:51 PM  CMP  Glucose 70 - 99 mg/dL 105  150  109   BUN 6 - 20 mg/dL 5  6  6   $ Creatinine 0.61 - 1.24 mg/dL 1.14  1.16  1.15   Sodium 135 - 145 mmol/L 140  142  139   Potassium 3.5 - 5.1 mmol/L 3.5  4.0  3.8   Chloride 98 - 111 mmol/L 104  107  106   CO2 22 - 32 mmol/L 26  28  24   $ Calcium 8.9 - 10.3 mg/dL 8.7  9.0  9.0   Total Protein 6.5 - 8.1 g/dL  6.5    Total Bilirubin 0.3 - 1.2 mg/dL  0.4    Alkaline Phos 38 - 126 U/L  77    AST 15 -  41 U/L  11    ALT 0 - 44 U/L  10     RADIOGRAPHIC STUDIES: I have personally reviewed the radiological images as listed and agreed with the findings in the report. VAS [place] ABI WITH/WO TBI  Result Date: 05/02/2022  LOWER EXTREMITY DOPPLER STUDY Patient Name:  ALLEY GORE  Date of Exam:   05/02/2022 Medical Rec #: [id number]        Accession #:    [id number] Date of Birth: 05/07/1965       Patient Gender: M Patient Age:   65 years Exam Location:  [facility] Procedure:      VAS [place] ABI WITH/WO TBI Referring Phys: Loralie Champagne --------------------------------------------------------------------------------  Indications: Patient presents with right calf and leg cramping that has been              going on for a couple weeks, reports that it is present all day              long. He has a history of DVT/PE and is on anticoagulation. High Risk Factors: Hypertension, no history of smoking.  Performing Technologist: Mariane Masters RVT  Examination Guidelines: A complete evaluation includes at minimum, Doppler waveform signals and systolic blood pressure reading at the level of bilateral brachial, anterior tibial, and posterior tibial arteries, when vessel segments are accessible. Bilateral testing is considered an integral part of a complete examination. Photoelectric Plethysmograph (PPG) waveforms and toe systolic pressure readings are included as required and additional duplex testing as needed. Limited examinations for reoccurring indications may be performed as noted.  ABI Findings: +---------+------------------+-----+---------+--------+ Right    Rt Pressure (mmHg)IndexWaveform Comment  +---------+------------------+-----+---------+--------+ Brachial 119                                      +---------+------------------+-----+---------+--------+ PTA      143               1.20 triphasic         +---------+------------------+-----+---------+--------+ DP       125               1.05 triphasic         +---------+------------------+-----+---------+--------+ Great Toe116               0.97 Normal            +---------+------------------+-----+---------+--------+ +---------+------------------+-----+---------+-------+ Left     Lt Pressure (mmHg)IndexWaveform Comment  +---------+------------------+-----+---------+-------+ Brachial 107                                     +---------+------------------+-----+---------+-------+ PTA      139               1.17 triphasic        +---------+------------------+-----+---------+-------+ DP       141               1.18 triphasic        +---------+------------------+-----+---------+-------+ Great Toe106               0.89 Normal           +---------+------------------+-----+---------+-------+ +-------+-----------+-----------+------------+------------+ ABI/TBIToday's ABIToday's TBIPrevious ABIPrevious TBI +-------+-----------+-----------+------------+------------+ Right  1.20       0.97                                +-------+-----------+-----------+------------+------------+  Left   1.18       0.89                                +-------+-----------+-----------+------------+------------+   Summary: Right: Resting right ankle-brachial index is within normal range. The right toe-brachial index is normal. Left: Resting left ankle-brachial index is within normal range. The left toe-brachial index is normal. *See table(s) above for measurements and observations.  Electronically signed by Ida Rogue MD on 05/02/2022 at 9:25:56 PM.    Final    VAS [place] LOWER EXTREMITY VENOUS (DVT)  Result Date: 04/30/2022  Lower Venous DVT Study Patient Name:  LOREN APRAHAMIAN  Date of Exam:   04/30/2022 Medical Rec #: [id number]       Accession #:    [id number] Date of Birth: 24-Jun-1965       Patient Gender: M Patient Age:   44 years Exam Location:  [facility] Procedure:      VAS [place] LOWER EXTREMITY VENOUS (DVT) Referring Phys: Johann Capers CONTE --------------------------------------------------------------------------------  Indications: Patient complains of right calf pain x 2 weeks. He indicates he's noticed increased chest pain but denies SOB. Patient diagnosed with right calf DVT 4 months ago in [place].  Anticoagulation: Pradaxa.  Comparison Study: None Performing Technologist: Alecia Mackin RVT, RDCS (AE), RDMS  Examination Guidelines: A complete evaluation includes B-mode imaging, spectral Doppler, color Doppler, and power Doppler as needed of all accessible portions of each vessel. Bilateral testing is considered an integral part of a complete examination. Limited examinations for reoccurring indications may be performed as noted. The reflux portion of the exam is performed with the patient in reverse Trendelenburg.  +---------+---------------+---------+-----------+----------+--------------+ RIGHT    CompressibilityPhasicitySpontaneityPropertiesThrombus Aging +---------+---------------+---------+-----------+----------+--------------+ CFV      Full           Yes      Yes                                 +---------+---------------+---------+-----------+----------+--------------+ SFJ      Full           Yes      Yes                                 +---------+---------------+---------+-----------+----------+--------------+ FV Prox  Full           Yes      Yes                                 +---------+---------------+---------+-----------+----------+--------------+ FV Mid   Full           Yes      Yes                                 +---------+---------------+---------+-----------+----------+--------------+ FV DistalFull           Yes      Yes                                 +---------+---------------+---------+-----------+----------+--------------+ PFV      Full                                                        +---------+---------------+---------+-----------+----------+--------------+  POP      Full           Yes      Yes                                 +---------+---------------+---------+-----------+----------+--------------+ PTV      Full           Yes      Yes                                 +---------+---------------+---------+-----------+----------+--------------+ PERO      Full           Yes      Yes                                 +---------+---------------+---------+-----------+----------+--------------+ Gastroc  Full                                                        +---------+---------------+---------+-----------+----------+--------------+ GSV      Full           Yes      Yes                                 +---------+---------------+---------+-----------+----------+--------------+   +----+---------------+---------+-----------+----------+--------------+ LEFTCompressibilityPhasicitySpontaneityPropertiesThrombus Aging +----+---------------+---------+-----------+----------+--------------+ CFV Full           Yes      Yes                                 +----+---------------+---------+-----------+----------+--------------+    Findings reported to Nicholes Rough, PA-C thru secure chat at 8:55 am.  Summary: RIGHT: - No evidence of deep vein thrombosis in the lower extremity. No indirect evidence of obstruction proximal to the inguinal ligament. - No cystic structure found in the popliteal fossa.  LEFT: - No evidence of common femoral vein obstruction.  *See table(s) above for measurements and observations. Electronically signed by Harold Barban MD on 04/30/2022 at 2:11:20 PM.    Final    DG Chest 2 View  Result Date: 04/10/2022 CLINICAL DATA:  Cough and shortness of breath for the past day. EXAM: CHEST - 2 VIEW COMPARISON:  Chest x-ray dated March 03, 2022. FINDINGS: Unchanged mild cardiomegaly. Normal pulmonary vascularity. Unchanged scarring in the lingula. Chronic blunting of the right costophrenic angle. No focal consolidation, pleural effusion, or pneumothorax. No acute osseous abnormality. IMPRESSION: 1. No acute cardiopulmonary disease. Electronically Signed   By: Titus Dubin M.D.   On: 04/10/2022 13:35    ASSESSMENT & PLAN AADON ROUTHIER is a 57 y.o. male who presents to the clinc for history of pulmonary embolism and recent  acute DVT of right lower extremity. We reviewed provoking factors that can cause VTEs includin prolonged travel/immobility, surgery (particular abdominal or orthropedic), trauma,  and pregnancy/ estrogen containing birth control. After a detailed history and review of the records there is no clear provoking factor for this patient's VTE.  Patients with unprovoked VTEs have up to 25% recurrence after 5  years and 36% at 10 years, with 4% of these clots being fatal (BMJ [phone]). Therefore the formal recommendation for unprovoked VTE's is lifelong anticoagulation, as the cause may not be transient or reversible.   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  --Since there is evidence of clot progression seen on 10/12/2021 while on Lovenox therapy, patient was switched to Coumadin.  --In September 2023, patient was found to have spontaneous large left thigh intramuscular hematoma in the setting of supratherapeutic INR greater than 7.  Coumadin was discontinued and patient was switched to Eliquis.  --Labs today were reviewed. There is improvement of anemia with Hgg 12.5. Normal creatinine and LFTs.  --Discussed that since patient failed Eliquis therapy in the past and unable to reach therapeutic dose with Coumadin, recommend Pradaxa 150 mg twice daily.  He has started this medication in the interim since her last visit. -- Based on the patient's beta-2 glycoprotein labs his findings are most consistent with antiphospholipid antibody syndrome.  Given that he only has beta-2 glycoprotein's positive and in no anticardiolipin's it should be appropriate to continue Pradaxa therapy.  (High risk APS requires Coumadin therapy). --RTC in 3 months' time with strict return precautions for  overt signs of bleeding.    No orders of the defined types were placed in this encounter.   All questions were answered. The patient knows to call the clinic with any problems, questions or concerns.  I have spent a total of 30 minutes minutes of face-to-face and non-face-to-face time, preparing to see the patient,  performing a medically appropriate examination, counseling and educating the patient, ordering medications/test,  documenting clinical information in the electronic health record and care coordination.   Ledell Peoples, MD Department of Hematology/Oncology [place] at [facility] Phone: [phone] Pager: [phone] Email: [email or web address]

## 2022-04-23 ENCOUNTER — Telehealth: Payer: Self-pay | Admitting: Interventional Cardiology

## 2022-04-23 ENCOUNTER — Telehealth: Payer: Self-pay | Admitting: Hematology and Oncology

## 2022-04-23 ENCOUNTER — Telehealth: Payer: Self-pay

## 2022-04-23 DIAGNOSIS — I428 Other cardiomyopathies: Secondary | ICD-10-CM

## 2022-04-23 DIAGNOSIS — R0609 Other forms of dyspnea: Secondary | ICD-10-CM

## 2022-04-23 LAB — BETA-2-GLYCOPROTEIN I ABS, IGG/M/A
Beta-2 Glyco I IgG: <9 GPI IgG units (ref 0–20)
Beta-2-Glycoprotein I IgA: 75 GPI IgA units — ABNORMAL HIGH (ref 0–25)
Beta-2-Glycoprotein I IgM: 87 GPI IgM units — ABNORMAL HIGH (ref 0–32)

## 2022-04-23 MED ORDER — ENTRESTO 49-51 MG PO TABS: 1.0000 | ORAL_TABLET | Freq: Two times a day (BID) | ORAL | 11 refills | Status: DC

## 2022-04-23 NOTE — Telephone Encounter (Signed)
Called patient per 2/2 los notes to schedule f/u. Patient scheduled and notified.

## 2022-04-23 NOTE — Telephone Encounter (Signed)
Pt c/o medication issue:  1. Name of Medication:  sacubitril-valsartan (ENTRESTO) 49-51 MG  2. How are you currently taking this medication (dosage and times per day)?   3. Are you having a reaction (difficulty breathing--STAT)?   4. What is your medication issue?   Patient states the pharmacy is requesting a PA prior to releasing the medication to the patient.  [facility], [place] [address] AT [place]

## 2022-04-23 NOTE — Telephone Encounter (Signed)
-----   Message from Werner Lean, MD sent at 04/20/2022 12:04 PM EST ----- Results: Severe LV dysfunction Significant MR Compared to prior concern for worsening HF and secondary MR No evidence of PNA  Plan: He was referred to [facility]- I do not see that he was scheduled Start Entresto 49/51 and BMP in one- two weeks  He needs [facility] and rapid titration of GDMT then CMR and, if still poor EF and MR TEE  Werner Lean, MD

## 2022-04-23 NOTE — Telephone Encounter (Signed)
The patient has been notified of the result and verbalized understanding.  All questions (if any) were answered. Bernestine Amass, RN 04/23/2022 1:46 PM  Prescription has been sent in. Labs ordered.  Will send note over to [facility] to follow up on referral and scheduling an appointment.

## 2022-04-24 NOTE — Telephone Encounter (Signed)
**Note De-Identified  Obfuscation** Obryan CHRISTOP Puzio (Key: [id number]) Outcome Your PA has been resolved, no additional PA is required. For further inquiries please contact the number on the back of the member prescription card. (Message [phone]) Drug Entresto 49-51MG  tablets ePA cloud Social research officer, government PA Form [id number] NCPDP)  I have notified [facility] [phone] - [address] (Ph: [phone]) of this outcome and I did request that they fill the pts Entresto RX and to notify him when ready for pick up.

## 2022-04-26 ENCOUNTER — Telehealth: Payer: Self-pay | Admitting: Internal Medicine

## 2022-04-26 NOTE — Telephone Encounter (Signed)
**Note De-Identified  Obfuscation** Per [facility] the pt now has Medicaid and that they require a PA for Entesto. They also advised me that they are currently out of Entresto 49/51 mg but have ordered it and will have it tomorrow.  I called  [employer] and did a [place] PA with [employer]. PA #: [phone]  Per Butch Penny we can call them back tomorrow morning at [phone] to get their determination.  The pt is aware of the above.

## 2022-04-26 NOTE — Telephone Encounter (Signed)
Patient would like a call back to discuss his recent medication change.  Patient stated the medication would need pre-authorization.

## 2022-04-27 ENCOUNTER — Other Ambulatory Visit (HOSPITAL_COMMUNITY): Payer: Self-pay

## 2022-04-27 ENCOUNTER — Ambulatory Visit (HOSPITAL_COMMUNITY)
Admission: RE | Admit: 2022-04-27 | Discharge: 2022-04-27 | Disposition: A | Discharge status: Home / Self Care | Payer: 59 | Source: Ambulatory Visit | Attending: Cardiology | Admitting: Cardiology

## 2022-04-27 ENCOUNTER — Telehealth (HOSPITAL_COMMUNITY): Payer: Self-pay | Admitting: Pharmacy Technician

## 2022-04-27 VITALS — BP 140/100 | HR 105 | Wt 224.0 lb

## 2022-04-27 DIAGNOSIS — Z7901 Long term (current) use of anticoagulants: Secondary | ICD-10-CM | POA: Diagnosis not present

## 2022-04-27 DIAGNOSIS — I5022 Chronic systolic (congestive) heart failure: Secondary | ICD-10-CM | POA: Diagnosis not present

## 2022-04-27 DIAGNOSIS — R000 Tachycardia, unspecified: Secondary | ICD-10-CM

## 2022-04-27 DIAGNOSIS — I340 Nonrheumatic mitral (valve) insufficiency: Secondary | ICD-10-CM | POA: Insufficient documentation

## 2022-04-27 DIAGNOSIS — S7012XA Contusion of left thigh, initial encounter: Secondary | ICD-10-CM | POA: Diagnosis not present

## 2022-04-27 DIAGNOSIS — F319 Bipolar disorder, unspecified: Secondary | ICD-10-CM | POA: Insufficient documentation

## 2022-04-27 DIAGNOSIS — Z86718 Personal history of other venous thrombosis and embolism: Secondary | ICD-10-CM | POA: Insufficient documentation

## 2022-04-27 DIAGNOSIS — Z86711 Personal history of pulmonary embolism: Secondary | ICD-10-CM | POA: Diagnosis not present

## 2022-04-27 DIAGNOSIS — I428 Other cardiomyopathies: Secondary | ICD-10-CM

## 2022-04-27 DIAGNOSIS — I110 Hypertensive heart disease with heart failure: Secondary | ICD-10-CM | POA: Diagnosis not present

## 2022-04-27 DIAGNOSIS — X58XXXA Exposure to other specified factors, initial encounter: Secondary | ICD-10-CM | POA: Diagnosis not present

## 2022-04-27 LAB — CBC
HCT: 37.1 % — ABNORMAL LOW (ref 39.0–52.0)
Hemoglobin: 12.1 g/dL — ABNORMAL LOW (ref 13.0–17.0)
MCH: 27.6 pg (ref 26.0–34.0)
MCHC: 32.6 g/dL (ref 30.0–36.0)
MCV: 84.5 fL (ref 80.0–100.0)
Platelets: 319 10*3/uL (ref 150–400)
RBC: 4.39 MIL/uL (ref 4.22–5.81)
RDW: 15.5 % (ref 11.5–15.5)
WBC: 9.8 10*3/uL (ref 4.0–10.5)
nRBC: 0 % (ref 0.0–0.2)

## 2022-04-27 LAB — BASIC METABOLIC PANEL
Anion gap: 10 (ref 5–15)
BUN: 5 mg/dL — ABNORMAL LOW (ref 6–20)
CO2: 26 mmol/L (ref 22–32)
Calcium: 8.7 mg/dL — ABNORMAL LOW (ref 8.9–10.3)
Chloride: 104 mmol/L (ref 98–111)
Creatinine, Ser: 1.14 mg/dL (ref 0.61–1.24)
GFR, Estimated: >60 mL/min (ref >60)
Glucose, Bld: 105 mg/dL — ABNORMAL HIGH (ref 70–99)
Potassium: 3.5 mmol/L (ref 3.5–5.1)
Sodium: 140 mmol/L (ref 135–145)

## 2022-04-27 LAB — BRAIN NATRIURETIC PEPTIDE: B Natriuretic Peptide: 88.7 pg/mL (ref 0.0–100.0)

## 2022-04-27 MED ORDER — EMPAGLIFLOZIN 10 MG PO TABS: 10.0000 mg | ORAL_TABLET | Freq: Every day | ORAL | 11 refills | Status: DC

## 2022-04-27 NOTE — Patient Instructions (Signed)
START Jardiance 10 mg daily.  Labs done today, your results will be available in MyChart, we will contact you for abnormal readings.  Your provider has ordered an ultra sound of your leg. You will be called to have the test arranged.  You are scheduled for a TEE (Transesophageal Echocardiogram) on Friday, March 1 with Dr. Aundra Dubin.  Please arrive at the [facility] (Main Entrance A) at [facility]: [address] [place], [place] [address] at 11:00 AM.    DIET:  Nothing to eat or drink after midnight except a sip of water with medications (see medication instructions below)  MEDICATION INSTRUCTIONS:  HOLD Jardiance the morning of procedure.  HOLD Pradaxa the before procedure and the day of procedure.  FYI:  For your safety, and to allow [place] to monitor your vital signs accurately during the surgery/procedure we request: If you have artificial nails, gel coating, SNS etc, please have those removed prior to your surgery/procedure. Not having the nail coverings /polish removed may result in cancellation or delay of your surgery/procedure.  You must have a responsible person to drive you home and stay in the waiting area during your procedure. Failure to do so could result in cancellation.  Bring your insurance cards.  *Special Note: Every effort is made to have your procedure done on time. Occasionally there are emergencies that occur at the hospital that may cause delays. Please be patient if a delay does occur.    You are scheduled for a Cardiac Catheterization on Friday, March 1 with Dr. Loralie Champagne.   2. Diet: Do not eat solid foods after midnight.  The patient may have clear liquids until 5am upon the day of the procedure.  3. 4. Medication instructions in preparation for your procedure:   Contrast Allergy: No  HOLD Jardiance day of procedure.  HOLD Pradaxa day before procedure and day of procedure.  On the morning of your procedure, take your any morning medicines  NOT listed above.  You may use sips of water.  Your physician recommends that you schedule a follow-up appointment as scheduled.  If you have any questions or concerns before your next appointment please send [place] a message through [place] or call our office at [phone].    TO LEAVE A MESSAGE FOR THE NURSE SELECT OPTION 2, PLEASE LEAVE A MESSAGE INCLUDING: YOUR NAME DATE OF BIRTH CALL BACK NUMBER REASON FOR CALL**this is important as we prioritize the call backs  YOU WILL RECEIVE A CALL BACK THE SAME DAY AS LONG AS YOU CALL BEFORE 4:00 PM  At the [facility], you and your health needs are our priority. As part of our continuing mission to provide you with exceptional heart care, we have created designated Provider Care Teams. These Care Teams include your primary Cardiologist (physician) and Advanced Practice Providers (APPs- Physician Assistants and Nurse Practitioners) who all work together to provide you with the care you need, when you need it.   You may see any of the following providers on your designated Care Team at your next follow up: Dr Glori Bickers Dr Loralie Champagne Dr. Roxana Hires, NP Lyda Jester, [place] [facility] [place], [place] Forestine Na, NP Audry Riles, PharmD   Please be sure to bring in all your medications bottles to every appointment.    Thank you for choosing [facility]

## 2022-04-27 NOTE — Telephone Encounter (Signed)
Patient Advocate Encounter   Received notification from [place] that prior authorization for Justin [name] is required.   PA submitted on CoverMyMeds Key [id number] Status is pending   Will continue to follow.  Patient Advocate Encounter   Received notification from Medicaid that prior authorization for Jardiance is required.   PA submitted on CoverMyMeds Key [id number] Status is pending   Will continue to follow.

## 2022-04-27 NOTE — Progress Notes (Signed)
Medication Samples have been provided to the patient.  Drug name: Delene Loll       Strength: 49/51        Qty: 4  LOT: [id number]  Exp.Date: 8/25  Dosing instructions: 1 Tablet twice daily  The patient has been instructed regarding the correct time, dose, and frequency of taking this medication, including desired effects and most common side effects.   Asencion Noble 3:58 PM 04/27/2022

## 2022-04-28 ENCOUNTER — Other Ambulatory Visit (HOSPITAL_COMMUNITY): Payer: Self-pay

## 2022-04-28 NOTE — Progress Notes (Signed)
PCP: Pa, [facility] Cardiology: Dr. Tamala Julian HF Cardiology: Dr. Aundra Dubin  57 y.o. with history of PE/DVT, bipolar disorder, and chronic systolic CHF was referred by Dr. Tamala Julian for evaluation of CHF. Patient has been on anticoagulation long-term for his h/o PE/DVT.  He does not miss doses.  Suspected clotting disorder, being worked up by hematology for possible antiphospholipid antibody syndrome.  He had initial PE in 2009, Eliquis started.  He had DVT on left while on Eliquis and switched to coumadin. In 9/23, he had a spontaneous left thigh hematoma while INR supratherapeutic.  Warfarin was held and eventually switched to Pradaxa   Due to progressive dyspnea over the last few months, echo was done.  This showed EF 20-25%, severe LV dilation, normal RV, moderate-severe MR. Patient had had coronary CTA in 12/23 with calcium score 49 and mild disease noted in LAD.  Patient reports dyspnea after walking about 10-15 yards for the last few months.  He is short of breath with stairs.  He gets frequent episodes of chest tightness, this can be with exertion or at rest.  He is very fatigued in general.  Delene Loll was ordered for him but he has not started it yet. BP is elevated today.  Patient reports right calf pain for a couple of weeks, worse with exertion.   ECG (personally reviewed): sinus tachy 101, PVC  Labs (12/23): Pro-BNP 547 Labs (2/24): K 4, creatinine 1.16  PMH: 1. HTN 2. Sleep study in 1/24 did not show significant OSA.  3. H/o DVT and PE: PE 2009 with Eliquis started, DVT on left in 7/23 while on Eliquis, switched to warfarin.  Had large left thigh intramuscular hematoma with supratherapeutic INR.  Now on Pradaxa.  4. Bipolar disorder: on Li.  5. H/o subdural hematoma 10/14 6. Chronic systolic CHF: Echo (0000000) with EF 20-25%, severe LV dilation, normal RV, moderate-severe MR.  - Coronary CTA (12/23): Calcium score 49, mild stenosis in LAD.  7. Spontaneous large left thigh intramuscular  hematoma in setting of supratherapeutic INR on warfarin.   FH: No cardiac problems that he knows of.   Social History   Socioeconomic History   Marital status: Single    Spouse name: Not on file   Number of children: Not on file   Years of education: Not on file   Highest education level: Not on file  Occupational History   Occupation: disability  Tobacco Use   Smoking status: Never   Smokeless tobacco: Never  Vaping Use   Vaping Use: Never used  Substance and Sexual Activity   Alcohol use: No   Drug use: No   Sexual activity: Not on file  Other Topics Concern   Not on file  Social History Narrative   FROM [place], [place]    Lives in a 2 story apartment.  His cousin is currently staying with him.  Has 4 children.  On disability for the past 10 years for pulmonary embolism, bipolar disease.  Education: high school.   Social Determinants of Health   Financial Resource Strain: Not on file  Food Insecurity: No Food Insecurity (12/22/2021)   Hunger Vital Sign    Worried About Running Out of Food in the Last Year: Never true    Ran Out of Food in the Last Year: Never true  Transportation Needs: No Transportation Needs (12/22/2021)   PRAPARE - Hydrologist (Medical): No    Lack of Transportation (Non-Medical): No  Physical Activity: Not on  file  Stress: Not on file  Social Connections: Not on file  Intimate Partner Violence: Not At Risk (12/22/2021)   Humiliation, Afraid, Rape, and Kick questionnaire    Fear of Current or Ex-Partner: No    Emotionally Abused: No    Physically Abused: No    Sexually Abused: No   ROS: All systems reviewed and negative except as per HPI.   Current Outpatient Medications  Medication Sig Dispense Refill   dabigatran (PRADAXA) 150 MG CAPS capsule Take 1 capsule (150 mg total) by mouth 2 (two) times daily. 180 capsule 0   doxepin (SINEQUAN) 75 MG capsule Take 75 mg by mouth at bedtime.     empagliflozin (JARDIANCE) 10  MG TABS tablet Take 1 tablet (10 mg total) by mouth daily before breakfast. 30 tablet 11   lithium 600 MG capsule Take 600 mg by mouth at bedtime.     mirtazapine (REMERON) 45 MG tablet Take 45 mg by mouth at bedtime.     QUEtiapine (SEROQUEL XR) 400 MG 24 hr tablet Take 800 mg by mouth at bedtime.     sacubitril-valsartan (ENTRESTO) 49-51 MG Take 1 tablet by mouth 2 (two) times daily. 60 tablet 11   tiZANidine (ZANAFLEX) 4 MG tablet Take 4 mg by mouth at bedtime.      No current facility-administered medications for this encounter.   BP (!) 140/100   Pulse (!) 105   Wt 101.6 kg (224 lb)   SpO2 96%   BMI 33.08 kg/m  General: NAD Neck: No JVD, no thyromegaly or thyroid nodule.  Lungs: Clear to auscultation bilaterally with normal respiratory effort. CV: Nondisplaced PMI.  Heart regular S1/S2, no S3/S4, 1/6 HSM LLSB.  No peripheral edema.  No carotid bruit.  Difficult to palpate pedal pulses.  Abdomen: Soft, nontender, no hepatosplenomegaly, no distention.  Skin: Intact without lesions or rashes.  Neurologic: Alert and oriented x 3.  Psych: Normal affect. Extremities: No clubbing or cyanosis.  HEENT: Normal.   Assessment/plan: 1. Venous thromboembolism: PE in 2009, DVT in 7/23 while on Eliquis.  Switched to warfarin and had spontaneous left thigh hematoma while INR supratherapeutic on warfarin.  Now on Pradaxa.   - Continue Pradaxa.  - He has right calf pain with exertion, difficult to palpate pulses.  I will check venous and arterial dopplers to look for DVT or significant PAD.   2. Chronic systolic CHF: Uncertain etiology, likely nonischemic.  Echo (1/24) with EF 20-25%, severe LV dilation, normal RV, moderate-severe MR. He does have significant chest pain as well as exertional dyspnea out of proportion to exam. He denies history of ETOH or drugs, no family history of cardiomyopathy or SCD.  He has a history of HTN.  NYHA class III symptoms but not significantly volume overloaded by  exam.  -He needs to start Entresto 49/51 bid, has been called in already.  - Add Farxiga 10 mg daily. BMET/BNP today and BMET in 10 days.  - No Lasix for now.  - RHC/LHC to assess filling pressures, cardiac output, coronaries. He will hold Pradaxa day before and day of.  Would like to have venous evaluation of right calf prior to cath.  We discussed risks/benefits and he agrees to procedure.  - Will likely need cardiac MRI.  3. Mitral regurgitation: Moderate-severe on 1/24 echo.  Suspect functional MR.  - Will assess severity by TEE, discussed risks/benefits and he agrees to procedure.  4. HTN: BP is elevated today, adding Entresto.   Followup in  1 month.   Loralie Champagne 04/28/2022

## 2022-04-28 NOTE — H&P (View-Only) (Signed)
PCP: Pa, [facility] Cardiology: Dr. Tamala Julian HF Cardiology: Dr. Aundra Dubin  57 y.o. with history of PE/DVT, bipolar disorder, and chronic systolic CHF was referred by Dr. Tamala Julian for evaluation of CHF. Patient has been on anticoagulation long-term for his h/o PE/DVT.  He does not miss doses.  Suspected clotting disorder, being worked up by hematology for possible antiphospholipid antibody syndrome.  He had initial PE in 2009, Eliquis started.  He had DVT on left while on Eliquis and switched to coumadin. In 9/23, he had a spontaneous left thigh hematoma while INR supratherapeutic.  Warfarin was held and eventually switched to Pradaxa   Due to progressive dyspnea over the last few months, echo was done.  This showed EF 20-25%, severe LV dilation, normal RV, moderate-severe MR. Patient had had coronary CTA in 12/23 with calcium score 49 and mild disease noted in LAD.  Patient reports dyspnea after walking about 10-15 yards for the last few months.  He is short of breath with stairs.  He gets frequent episodes of chest tightness, this can be with exertion or at rest.  He is very fatigued in general.  Delene Loll was ordered for him but he has not started it yet. BP is elevated today.  Patient reports right calf pain for a couple of weeks, worse with exertion.   ECG (personally reviewed): sinus tachy 101, PVC  Labs (12/23): Pro-BNP 547 Labs (2/24): K 4, creatinine 1.16  PMH: 1. HTN 2. Sleep study in 1/24 did not show significant OSA.  3. H/o DVT and PE: PE 2009 with Eliquis started, DVT on left in 7/23 while on Eliquis, switched to warfarin.  Had large left thigh intramuscular hematoma with supratherapeutic INR.  Now on Pradaxa.  4. Bipolar disorder: on Li.  5. H/o subdural hematoma 10/14 6. Chronic systolic CHF: Echo (0000000) with EF 20-25%, severe LV dilation, normal RV, moderate-severe MR.  - Coronary CTA (12/23): Calcium score 49, mild stenosis in LAD.  7. Spontaneous large left thigh intramuscular  hematoma in setting of supratherapeutic INR on warfarin.   FH: No cardiac problems that he knows of.   Social History   Socioeconomic History   Marital status: Single    Spouse name: Not on file   Number of children: Not on file   Years of education: Not on file   Highest education level: Not on file  Occupational History   Occupation: disability  Tobacco Use   Smoking status: Never   Smokeless tobacco: Never  Vaping Use   Vaping Use: Never used  Substance and Sexual Activity   Alcohol use: No   Drug use: No   Sexual activity: Not on file  Other Topics Concern   Not on file  Social History Narrative   FROM [place], [place]    Lives in a 2 story apartment.  His cousin is currently staying with him.  Has 4 children.  On disability for the past 10 years for pulmonary embolism, bipolar disease.  Education: high school.   Social Determinants of Health   Financial Resource Strain: Not on file  Food Insecurity: No Food Insecurity (12/22/2021)   Hunger Vital Sign    Worried About Running Out of Food in the Last Year: Never true    Ran Out of Food in the Last Year: Never true  Transportation Needs: No Transportation Needs (12/22/2021)   PRAPARE - Hydrologist (Medical): No    Lack of Transportation (Non-Medical): No  Physical Activity: Not on  file  Stress: Not on file  Social Connections: Not on file  Intimate Partner Violence: Not At Risk (12/22/2021)   Humiliation, Afraid, Rape, and Kick questionnaire    Fear of Current or Ex-Partner: No    Emotionally Abused: No    Physically Abused: No    Sexually Abused: No   ROS: All systems reviewed and negative except as per HPI.   Current Outpatient Medications  Medication Sig Dispense Refill   dabigatran (PRADAXA) 150 MG CAPS capsule Take 1 capsule (150 mg total) by mouth 2 (two) times daily. 180 capsule 0   doxepin (SINEQUAN) 75 MG capsule Take 75 mg by mouth at bedtime.     empagliflozin (JARDIANCE) 10  MG TABS tablet Take 1 tablet (10 mg total) by mouth daily before breakfast. 30 tablet 11   lithium 600 MG capsule Take 600 mg by mouth at bedtime.     mirtazapine (REMERON) 45 MG tablet Take 45 mg by mouth at bedtime.     QUEtiapine (SEROQUEL XR) 400 MG 24 hr tablet Take 800 mg by mouth at bedtime.     sacubitril-valsartan (ENTRESTO) 49-51 MG Take 1 tablet by mouth 2 (two) times daily. 60 tablet 11   tiZANidine (ZANAFLEX) 4 MG tablet Take 4 mg by mouth at bedtime.      No current facility-administered medications for this encounter.   BP (!) 140/100   Pulse (!) 105   Wt 101.6 kg (224 lb)   SpO2 96%   BMI 33.08 kg/m  General: NAD Neck: No JVD, no thyromegaly or thyroid nodule.  Lungs: Clear to auscultation bilaterally with normal respiratory effort. CV: Nondisplaced PMI.  Heart regular S1/S2, no S3/S4, 1/6 HSM LLSB.  No peripheral edema.  No carotid bruit.  Difficult to palpate pedal pulses.  Abdomen: Soft, nontender, no hepatosplenomegaly, no distention.  Skin: Intact without lesions or rashes.  Neurologic: Alert and oriented x 3.  Psych: Normal affect. Extremities: No clubbing or cyanosis.  HEENT: Normal.   Assessment/plan: 1. Venous thromboembolism: PE in 2009, DVT in 7/23 while on Eliquis.  Switched to warfarin and had spontaneous left thigh hematoma while INR supratherapeutic on warfarin.  Now on Pradaxa.   - Continue Pradaxa.  - He has right calf pain with exertion, difficult to palpate pulses.  I will check venous and arterial dopplers to look for DVT or significant PAD.   2. Chronic systolic CHF: Uncertain etiology, likely nonischemic.  Echo (1/24) with EF 20-25%, severe LV dilation, normal RV, moderate-severe MR. He does have significant chest pain as well as exertional dyspnea out of proportion to exam. He denies history of ETOH or drugs, no family history of cardiomyopathy or SCD.  He has a history of HTN.  NYHA class III symptoms but not significantly volume overloaded by  exam.  -He needs to start Entresto 49/51 bid, has been called in already.  - Add Farxiga 10 mg daily. BMET/BNP today and BMET in 10 days.  - No Lasix for now.  - RHC/LHC to assess filling pressures, cardiac output, coronaries. He will hold Pradaxa day before and day of.  Would like to have venous evaluation of right calf prior to cath.  We discussed risks/benefits and he agrees to procedure.  - Will likely need cardiac MRI.  3. Mitral regurgitation: Moderate-severe on 1/24 echo.  Suspect functional MR.  - Will assess severity by TEE, discussed risks/benefits and he agrees to procedure.  4. HTN: BP is elevated today, adding Entresto.   Followup in  1 month.   Loralie Champagne 04/28/2022

## 2022-04-30 ENCOUNTER — Other Ambulatory Visit (HOSPITAL_COMMUNITY): Payer: Self-pay

## 2022-04-30 ENCOUNTER — Ambulatory Visit (HOSPITAL_COMMUNITY)
Admission: RE | Admit: 2022-04-30 | Discharge: 2022-04-30 | Disposition: A | Discharge status: Home / Self Care | Payer: 59 | Source: Ambulatory Visit | Attending: Physician Assistant | Admitting: Physician Assistant

## 2022-04-30 ENCOUNTER — Other Ambulatory Visit (HOSPITAL_COMMUNITY): Payer: Self-pay | Admitting: Cardiology

## 2022-04-30 DIAGNOSIS — Z86718 Personal history of other venous thrombosis and embolism: Secondary | ICD-10-CM | POA: Diagnosis not present

## 2022-04-30 DIAGNOSIS — I2699 Other pulmonary embolism without acute cor pulmonale: Secondary | ICD-10-CM | POA: Diagnosis not present

## 2022-04-30 DIAGNOSIS — I739 Peripheral vascular disease, unspecified: Secondary | ICD-10-CM

## 2022-04-30 DIAGNOSIS — R0989 Other specified symptoms and signs involving the circulatory and respiratory systems: Secondary | ICD-10-CM

## 2022-04-30 DIAGNOSIS — I428 Other cardiomyopathies: Secondary | ICD-10-CM

## 2022-04-30 NOTE — Telephone Encounter (Signed)
Advanced Heart Failure Patient Advocate Encounter  Prior Authorization for Justin [name] has been approved through [employer].    PA# [id number] Effective dates: 04/28/22 through 04/29/23  Advanced Heart Failure Patient Advocate Encounter  Prior Authorization for Justin [name] has been approved through [place].    PA# [id number] Effective dates: 04/27/22 through 04/27/23  Justin [name], CPhT

## 2022-05-02 ENCOUNTER — Ambulatory Visit (HOSPITAL_COMMUNITY)
Admission: RE | Admit: 2022-05-02 | Discharge: 2022-05-02 | Disposition: A | Discharge status: Home / Self Care | Payer: 59 | Source: Ambulatory Visit | Attending: Cardiology | Admitting: Cardiology

## 2022-05-02 DIAGNOSIS — I739 Peripheral vascular disease, unspecified: Secondary | ICD-10-CM | POA: Insufficient documentation

## 2022-05-02 DIAGNOSIS — Z86718 Personal history of other venous thrombosis and embolism: Secondary | ICD-10-CM | POA: Insufficient documentation

## 2022-05-02 DIAGNOSIS — R0989 Other specified symptoms and signs involving the circulatory and respiratory systems: Secondary | ICD-10-CM | POA: Diagnosis not present

## 2022-05-02 LAB — VAS US ABI WITH/WO TBI
Left ABI: 1.18
Right ABI: 1.2

## 2022-05-03 ENCOUNTER — Telehealth (HOSPITAL_COMMUNITY): Payer: Self-pay

## 2022-05-03 NOTE — Telephone Encounter (Signed)
Patient aware of results of vascular study

## 2022-05-18 ENCOUNTER — Ambulatory Visit (HOSPITAL_BASED_OUTPATIENT_CLINIC_OR_DEPARTMENT_OTHER): Payer: Medicaid Other | Admitting: Anesthesiology

## 2022-05-18 ENCOUNTER — Other Ambulatory Visit: Payer: Self-pay

## 2022-05-18 ENCOUNTER — Encounter (HOSPITAL_COMMUNITY): Payer: Self-pay | Admitting: Cardiology

## 2022-05-18 ENCOUNTER — Ambulatory Visit (HOSPITAL_COMMUNITY): Payer: Medicaid Other | Admitting: Anesthesiology

## 2022-05-18 ENCOUNTER — Encounter (HOSPITAL_COMMUNITY)
Admission: RE | Disposition: A | Discharge status: Home / Self Care | Payer: Self-pay | Source: Ambulatory Visit | Attending: Cardiology

## 2022-05-18 ENCOUNTER — Ambulatory Visit (HOSPITAL_COMMUNITY)
Admission: RE | Admit: 2022-05-18 | Discharge: 2022-05-18 | Disposition: A | Discharge status: Home / Self Care | Payer: Medicaid Other | Source: Ambulatory Visit | Attending: Cardiology | Admitting: Cardiology

## 2022-05-18 DIAGNOSIS — G4730 Sleep apnea, unspecified: Secondary | ICD-10-CM | POA: Diagnosis not present

## 2022-05-18 DIAGNOSIS — I428 Other cardiomyopathies: Secondary | ICD-10-CM

## 2022-05-18 DIAGNOSIS — Z79899 Other long term (current) drug therapy: Secondary | ICD-10-CM | POA: Insufficient documentation

## 2022-05-18 DIAGNOSIS — I5022 Chronic systolic (congestive) heart failure: Secondary | ICD-10-CM | POA: Diagnosis not present

## 2022-05-18 DIAGNOSIS — I10 Essential (primary) hypertension: Secondary | ICD-10-CM

## 2022-05-18 DIAGNOSIS — Z7984 Long term (current) use of oral hypoglycemic drugs: Secondary | ICD-10-CM | POA: Diagnosis not present

## 2022-05-18 DIAGNOSIS — I2510 Atherosclerotic heart disease of native coronary artery without angina pectoris: Secondary | ICD-10-CM | POA: Insufficient documentation

## 2022-05-18 DIAGNOSIS — E119 Type 2 diabetes mellitus without complications: Secondary | ICD-10-CM | POA: Diagnosis not present

## 2022-05-18 DIAGNOSIS — I081 Rheumatic disorders of both mitral and tricuspid valves: Secondary | ICD-10-CM

## 2022-05-18 DIAGNOSIS — Z86711 Personal history of pulmonary embolism: Secondary | ICD-10-CM | POA: Insufficient documentation

## 2022-05-18 DIAGNOSIS — I340 Nonrheumatic mitral (valve) insufficiency: Secondary | ICD-10-CM | POA: Insufficient documentation

## 2022-05-18 DIAGNOSIS — I5020 Unspecified systolic (congestive) heart failure: Secondary | ICD-10-CM

## 2022-05-18 DIAGNOSIS — Z7901 Long term (current) use of anticoagulants: Secondary | ICD-10-CM | POA: Insufficient documentation

## 2022-05-18 DIAGNOSIS — I110 Hypertensive heart disease with heart failure: Secondary | ICD-10-CM | POA: Insufficient documentation

## 2022-05-18 HISTORY — PX: RIGHT/LEFT HEART CATH AND CORONARY ANGIOGRAPHY: CATH118266

## 2022-05-18 HISTORY — PX: TEE WITHOUT CARDIOVERSION: SHX5443

## 2022-05-18 LAB — POCT I-STAT EG7
Acid-base deficit: 1 mmol/L (ref 0.0–2.0)
Acid-base deficit: 1 mmol/L (ref 0.0–2.0)
Bicarbonate: 24.3 mmol/L (ref 20.0–28.0)
Bicarbonate: 24.3 mmol/L (ref 20.0–28.0)
Calcium, Ion: 1.18 mmol/L (ref 1.15–1.40)
Calcium, Ion: 1.22 mmol/L (ref 1.15–1.40)
HCT: 37 % — ABNORMAL LOW (ref 39.0–52.0)
HCT: 38 % — ABNORMAL LOW (ref 39.0–52.0)
Hemoglobin: 12.6 g/dL — ABNORMAL LOW (ref 13.0–17.0)
Hemoglobin: 12.9 g/dL — ABNORMAL LOW (ref 13.0–17.0)
O2 Saturation: 67 %
O2 Saturation: 69 %
Potassium: 3.6 mmol/L (ref 3.5–5.1)
Potassium: 3.7 mmol/L (ref 3.5–5.1)
Sodium: 146 mmol/L — ABNORMAL HIGH (ref 135–145)
Sodium: 147 mmol/L — ABNORMAL HIGH (ref 135–145)
TCO2: 26 mmol/L (ref 22–32)
TCO2: 26 mmol/L (ref 22–32)
pCO2, Ven: 40.9 mmHg — ABNORMAL LOW (ref 44–60)
pCO2, Ven: 41.3 mmHg — ABNORMAL LOW (ref 44–60)
pH, Ven: 7.377 (ref 7.25–7.43)
pH, Ven: 7.382 (ref 7.25–7.43)
pO2, Ven: 36 mmHg (ref 32–45)
pO2, Ven: 37 mmHg (ref 32–45)

## 2022-05-18 LAB — ECHO TEE
MV M vel: 3.66 m/s
MV Peak grad: 53.6 mmHg
Radius: 0.4 cm

## 2022-05-18 SURGERY — ECHOCARDIOGRAM, TRANSESOPHAGEAL
Anesthesia: Monitor Anesthesia Care

## 2022-05-18 SURGERY — RIGHT/LEFT HEART CATH AND CORONARY ANGIOGRAPHY
Anesthesia: LOCAL

## 2022-05-18 MED ORDER — ONDANSETRON HCL 4 MG/2ML IJ SOLN: 4.0000 mg | Freq: Four times a day (QID) | INTRAMUSCULAR | Status: DC | PRN

## 2022-05-18 MED ORDER — SODIUM CHLORIDE 0.9 % IV SOLN: INTRAVENOUS | Status: DC

## 2022-05-18 MED ORDER — LIDOCAINE HCL (PF) 1 % IJ SOLN
INTRAMUSCULAR | Status: AC
  Filled 2022-05-18: qty 30

## 2022-05-18 MED ORDER — VERAPAMIL HCL 2.5 MG/ML IV SOLN
INTRAVENOUS | Status: DC | PRN
  Administered 2022-05-18: 10 mL via INTRA_ARTERIAL

## 2022-05-18 MED ORDER — MIDAZOLAM HCL 2 MG/2ML IJ SOLN
INTRAMUSCULAR | Status: DC | PRN
  Administered 2022-05-18: 1 mg via INTRAVENOUS

## 2022-05-18 MED ORDER — HYDRALAZINE HCL 20 MG/ML IJ SOLN: 10.0000 mg | INTRAMUSCULAR | Status: DC | PRN

## 2022-05-18 MED ORDER — PHENYLEPHRINE 80 MCG/ML (10ML) SYRINGE FOR IV PUSH (FOR BLOOD PRESSURE SUPPORT)
PREFILLED_SYRINGE | INTRAVENOUS | Status: DC | PRN
  Administered 2022-05-18 (×2): 80 ug via INTRAVENOUS

## 2022-05-18 MED ORDER — HEPARIN (PORCINE) IN NACL 1000-0.9 UT/500ML-% IV SOLN
INTRAVENOUS | Status: DC | PRN
  Administered 2022-05-18 (×2): 500 mL

## 2022-05-18 MED ORDER — SODIUM CHLORIDE 0.9% FLUSH: 3.0000 mL | Freq: Two times a day (BID) | INTRAVENOUS | Status: DC

## 2022-05-18 MED ORDER — ASPIRIN 81 MG PO CHEW
CHEWABLE_TABLET | ORAL | Status: DC | PRN
  Administered 2022-05-18: 81 mg via ORAL

## 2022-05-18 MED ORDER — SODIUM CHLORIDE 0.9% FLUSH: 3.0000 mL | INTRAVENOUS | Status: DC | PRN

## 2022-05-18 MED ORDER — VERAPAMIL HCL 2.5 MG/ML IV SOLN
INTRAVENOUS | Status: AC
  Filled 2022-05-18: qty 2

## 2022-05-18 MED ORDER — PROPOFOL 10 MG/ML IV BOLUS
INTRAVENOUS | Status: DC | PRN
  Administered 2022-05-18: 30 mg via INTRAVENOUS
  Administered 2022-05-18: 20 mg via INTRAVENOUS
  Administered 2022-05-18: 30 mg via INTRAVENOUS

## 2022-05-18 MED ORDER — HEPARIN SODIUM (PORCINE) 1000 UNIT/ML IJ SOLN
INTRAMUSCULAR | Status: DC | PRN
  Administered 2022-05-18: 5000 [IU] via INTRAVENOUS

## 2022-05-18 MED ORDER — ACETAMINOPHEN 325 MG PO TABS: 650.0000 mg | ORAL_TABLET | ORAL | Status: DC | PRN

## 2022-05-18 MED ORDER — IOHEXOL 350 MG/ML SOLN
INTRAVENOUS | Status: DC | PRN
  Administered 2022-05-18: 30 mL via INTRA_ARTERIAL

## 2022-05-18 MED ORDER — PROPOFOL 500 MG/50ML IV EMUL
INTRAVENOUS | Status: DC | PRN
  Administered 2022-05-18: 160 ug/kg/min via INTRAVENOUS

## 2022-05-18 MED ORDER — FENTANYL CITRATE (PF) 100 MCG/2ML IJ SOLN
INTRAMUSCULAR | Status: DC | PRN
  Administered 2022-05-18: 25 ug via INTRAVENOUS

## 2022-05-18 MED ORDER — LABETALOL HCL 5 MG/ML IV SOLN
10.0000 mg | INTRAVENOUS | Status: DC | PRN
  Filled 2022-05-18: qty 4

## 2022-05-18 MED ORDER — LIDOCAINE HCL (PF) 1 % IJ SOLN
INTRAMUSCULAR | Status: DC | PRN
  Administered 2022-05-18 (×2): 2 mL via INTRADERMAL

## 2022-05-18 MED ORDER — MIDAZOLAM HCL 2 MG/2ML IJ SOLN
INTRAMUSCULAR | Status: AC
  Filled 2022-05-18: qty 2

## 2022-05-18 MED ORDER — FENTANYL CITRATE (PF) 100 MCG/2ML IJ SOLN
INTRAMUSCULAR | Status: AC
  Filled 2022-05-18: qty 2

## 2022-05-18 MED ORDER — DABIGATRAN ETEXILATE MESYLATE 150 MG PO CAPS: 150.0000 mg | ORAL_CAPSULE | Freq: Two times a day (BID) | ORAL | 0 refills | Status: DC

## 2022-05-18 MED ORDER — ASPIRIN 81 MG PO CHEW
CHEWABLE_TABLET | ORAL | Status: AC
  Filled 2022-05-18: qty 1

## 2022-05-18 MED ORDER — LIDOCAINE HCL (CARDIAC) PF 100 MG/5ML IV SOSY
PREFILLED_SYRINGE | INTRAVENOUS | Status: DC | PRN
  Administered 2022-05-18: 60 mg via INTRAVENOUS

## 2022-05-18 MED ORDER — SODIUM CHLORIDE 0.9 % IV SOLN: 250.0000 mL | INTRAVENOUS | Status: DC | PRN

## 2022-05-18 MED ORDER — HEPARIN SODIUM (PORCINE) 1000 UNIT/ML IJ SOLN
INTRAMUSCULAR | Status: AC
  Filled 2022-05-18: qty 10

## 2022-05-18 SURGICAL SUPPLY — 11 items
CATH 5FR JL3.5 JR4 ANG PIG MP (CATHETERS) IMPLANT
CATH BALLN WEDGE 5F 110CM (CATHETERS) IMPLANT
DEVICE RAD COMP TR BAND LRG (VASCULAR PRODUCTS) IMPLANT
GLIDESHEATH SLEND SS 6F .021 (SHEATH) IMPLANT
GUIDEWIRE INQWIRE 1.5J.035X260 (WIRE) IMPLANT
INQWIRE 1.5J .035X260CM (WIRE) ×1
KIT HEART LEFT (KITS) ×1 IMPLANT
PACK CARDIAC CATHETERIZATION (CUSTOM PROCEDURE TRAY) ×1 IMPLANT
SHEATH GLIDE SLENDER 4/5FR (SHEATH) IMPLANT
SHEATH PROBE COVER 6X72 (BAG) IMPLANT
TRANSDUCER W/STOPCOCK (MISCELLANEOUS) ×1 IMPLANT

## 2022-05-18 NOTE — Anesthesia Preprocedure Evaluation (Signed)
Anesthesia Evaluation  Patient identified by MRN, date of birth, ID band Patient awake    Reviewed: Allergy & Precautions, NPO status , Patient's Chart, lab work & pertinent test results  Airway Mallampati: II  TM Distance: >3 FB Neck ROM: Full    Dental  (+) Teeth Intact, Poor Dentition   Pulmonary sleep apnea    breath sounds clear to auscultation       Cardiovascular hypertension, Pt. on medications  Rhythm:Regular Rate:Normal  Echo:  1. Left ventricular ejection fraction, by estimation, is 20 to 25%. The  left ventricle has severely decreased function. The left ventricle  demonstrates global hypokinesis. The left ventricular internal cavity size  was severely dilated. Indeterminate  diastolic filling due to E-A fusion.   2. Right ventricular systolic function is normal. The right ventricular  size is normal. There is normal pulmonary artery systolic pressure.   3. Left atrial size was severely dilated.   4. The mitral valve is abnormal in structure. Significant functional MR  related to MV teathering from LV dilatation and LV dysfunction. Moderate  to severe mitral valve regurgitation. No evidence of mitral stenosis. MR  EROA 0.25cm2, MR vol 33cc, MR  radius 0.8cm   5. The aortic valve is tricuspid. Aortic valve regurgitation is mild.  Aortic valve sclerosis is present, with no evidence of aortic valve  stenosis.   6. The inferior vena cava is normal in size with greater than 50%  respiratory variability, suggesting right atrial pressure of 3 mmHg.   7. Consider TEE for further assessment of degree of MR.      Neuro/Psych  Headaches PSYCHIATRIC DISORDERS   Bipolar Disorder    Neuromuscular disease    GI/Hepatic negative GI ROS, Neg liver ROS,,,  Endo/Other  diabetes, Type 2, Oral Hypoglycemic Agents    Renal/GU negative Renal ROS     Musculoskeletal negative musculoskeletal ROS (+)    Abdominal   Peds   Hematology   Anesthesia Other Findings   Reproductive/Obstetrics                             Anesthesia Physical Anesthesia Plan  ASA: 4  Anesthesia Plan: MAC   Post-op Pain Management: Minimal or no pain anticipated   Induction: Intravenous  PONV Risk Score and Plan: 0 and Propofol infusion  Airway Management Planned: Simple Face Mask and Natural Airway  Additional Equipment: None  Intra-op Plan:   Post-operative Plan:   Informed Consent: I have reviewed the patients History and Physical, chart, labs and discussed the procedure including the risks, benefits and alternatives for the proposed anesthesia with the patient or authorized representative who has indicated his/her understanding and acceptance.       Plan Discussed with: CRNA  Anesthesia Plan Comments:        Anesthesia Quick Evaluation

## 2022-05-18 NOTE — Discharge Instructions (Addendum)
Restart Pradaxa tomorrow morning.    Drink plenty of fluids for 48 hours and keep wrist elevated at heart level for 24 hours  Radial Site Care   This sheet gives you information about how to care for yourself after your procedure. Your health care provider may also give you more specific instructions. If you have problems or questions, contact your health care provider. What can I expect after the procedure? After the procedure, it is common to have: Bruising and tenderness at the catheter insertion area. Follow these instructions at home: Medicines Take over-the-counter and prescription medicines only as told by your health care provider. Insertion site care Follow instructions from your health care provider about how to take care of your insertion site. Make sure you: Wash your hands with soap and water before you change your bandage (dressing). If soap and water are not available, use hand sanitizer. Remove your dressing as told by your health care provider. In 24 hours Check your insertion site every day for signs of infection. Check for: Redness, swelling, or pain. Fluid or blood. Pus or a bad smell. Warmth. Do not take baths, swim, or use a hot tub until your health care provider approves. You may shower 24-48 hours after the procedure, or as directed by your health care provider. Remove the dressing and gently wash the site with plain soap and water. Pat the area dry with a clean towel. Do not rub the site. That could cause bleeding. Do not apply powder or lotion to the site. Activity   For 24 hours after the procedure, or as directed by your health care provider: Do not flex or bend the affected arm. Do not push or pull heavy objects with the affected arm. Do not drive yourself home from the hospital or clinic. You may drive 24 hours after the procedure unless your health care provider tells you not to. Do not operate machinery or power tools. Do not lift anything that  is heavier than 10 lb (4.5 kg), or the limit that you are told, until your health care provider says that it is safe.  For 4 days Ask your health care provider when it is okay to: Return to work or school. Resume usual physical activities or sports. Resume sexual activity. General instructions If the catheter site starts to bleed, raise your arm and put firm pressure on the site. If the bleeding does not stop, get help right away. This is a medical emergency. If you went home on the same day as your procedure, a responsible adult should be with you for the first 24 hours after you arrive home. Keep all follow-up visits as told by your health care provider. This is important. Contact a health care provider if: You have a fever. You have redness, swelling, or yellow drainage around your insertion site. Get help right away if: You have unusual pain at the radial site. The catheter insertion area swells very fast. The insertion area is bleeding, and the bleeding does not stop when you hold steady pressure on the area. Your arm or hand becomes pale, cool, tingly, or numb. These symptoms may represent a serious problem that is an emergency. Do not wait to see if the symptoms will go away. Get medical help right away. Call your local emergency services (911 in the [place] Do not drive yourself to the hospital. Summary After the procedure, it is common to have bruising and tenderness at the site. Follow instructions from your health care provider  about how to take care of your radial site wound. Check the wound every day for signs of infection. Do not lift anything that is heavier than 10 lb (4.5 kg), or the limit that you are told, until your health care provider says that it is safe. This information is not intended to replace advice given to you by your health care provider. Make sure you discuss any questions you have with your health care provider. Document Revised: 04/10/2017 Document Reviewed:  04/10/2017 Elsevier Patient Education  2020 [employer].

## 2022-05-18 NOTE — Interval H&P Note (Signed)
History and Physical Interval Note:  05/18/2022 11:26 AM  Justin [name]  has presented today for surgery, with the diagnosis of MITRAL REGURGIGATION.  The various methods of treatment have been discussed with the patient and family. After consideration of risks, benefits and other options for treatment, the patient has consented to  Procedure(s): TRANSESOPHAGEAL ECHOCARDIOGRAM (TEE) (N/A) as a surgical intervention.  The patient's history has been reviewed, patient examined, no change in status, stable for surgery.  I have reviewed the patient's chart and labs.  Questions were answered to the patient's satisfaction.     Marquesa Rath [employer]

## 2022-05-18 NOTE — Progress Notes (Signed)
Entered the patient bay and he was completely dressed and O2 sat was removed and he was going to the bathroom. When he returned to the bay he insisted I remove the TR BAND and IV since I had completed discharge teaching with him and his sister. I explained again the process for TR BAND removal and the recommended time of discharge. The last 3 cc of air was removed from TR BAND and redressed with gauze and Tegaderm. IV discontinued.  Explained again to the patient and sister the increase risk of bleeding.

## 2022-05-18 NOTE — Transfer of Care (Signed)
Immediate Anesthesia Transfer of Care Note  Patient: Justin [name]  Procedure(s) Performed: TRANSESOPHAGEAL ECHOCARDIOGRAM (TEE)  Patient Location: Endoscopy Unit  Anesthesia Type:MAC  Level of Consciousness: drowsy  Airway & Oxygen Therapy: Patient Spontanous Breathing and Patient connected to nasal cannula oxygen  Post-op Assessment: Report given to RN and Post -op Vital signs reviewed and stable  Post vital signs: Reviewed and stable  Last Vitals:  Vitals Value Taken Time  BP    Temp    Pulse    Resp    SpO2      Last Pain:  Vitals:   05/18/22 1115  PainSc: 0-No pain         Complications: No notable events documented.

## 2022-05-18 NOTE — Interval H&P Note (Signed)
History and Physical Interval Note:  05/18/2022 12:38 PM  Justin [name]  has presented today for surgery, with the diagnosis of CHF.  The various methods of treatment have been discussed with the patient and family. After consideration of risks, benefits and other options for treatment, the patient has consented to  Procedure(s): RIGHT/LEFT HEART CATH AND CORONARY ANGIOGRAPHY (N/A) as a surgical intervention.  The patient's history has been reviewed, patient examined, no change in status, stable for surgery.  I have reviewed the patient's chart and labs.  Questions were answered to the patient's satisfaction.     Ciella Obi [employer]

## 2022-05-18 NOTE — Anesthesia Procedure Notes (Signed)
Procedure Name: MAC Date/Time: 05/18/2022 11:50 AM  Performed by: Lieutenant Diego, CRNAPre-anesthesia Checklist: Patient identified, Emergency Drugs available, Suction available, Patient being monitored and Timeout performed Patient Re-evaluated:Patient Re-evaluated prior to induction Oxygen Delivery Method: Nasal cannula Preoxygenation: Pre-oxygenation with 100% oxygen Induction Type: IV induction

## 2022-05-18 NOTE — Anesthesia Postprocedure Evaluation (Signed)
Anesthesia Post Note  Patient: Justin [name]  Procedure(s) Performed: TRANSESOPHAGEAL ECHOCARDIOGRAM (TEE)     Patient location during evaluation: PACU Anesthesia Type: MAC Level of consciousness: awake and alert Pain management: pain level controlled Vital Signs Assessment: post-procedure vital signs reviewed and stable Respiratory status: spontaneous breathing, nonlabored ventilation, respiratory function stable and patient connected to nasal cannula oxygen Cardiovascular status: stable and blood pressure returned to baseline Postop Assessment: no apparent nausea or vomiting Anesthetic complications: no  No notable events documented.  Last Vitals:  Vitals:   05/18/22 1307 05/18/22 1312  BP: (!) 138/98 (!) 138/98  Pulse: 96 (!) 0  Resp: (!) 21   Temp:    SpO2: 94% 93%    Last Pain:  Vitals:   05/18/22 1303  TempSrc:   PainSc: Asleep                 Effie Berkshire

## 2022-05-18 NOTE — CV Procedure (Signed)
Procedure: TEE  Sedation: Per anesthesiology  Indication: Mitral regurgitation  Findings: Please see echo section for full report. Moderately dilated left ventricle with normal wall thickness.  No LV thrombus noted.  Diffuse hypokinesis, EF 25-30%.  Normal RV size with mildly decreased systolic function.  Moderate left atrial enlargement, no LA appendage thrombus. Normal right atrium.  No PFO/ASD by color doppler.  Trivial tricuspid regurgitation.  There was only mild mitral regurgitation, PISA ERO 0.11 cm^2.  No pulmonary vein systolic flow reversal on doppler evaluation. Trileaflet aortic valve with no stenosis or regurgitation.  Normal caliber thoracic aorta with mild plaque.   MR appears to be only mild in setting of BP control and med management.   Justin [name] 05/18/2022 12:01 PM

## 2022-05-21 ENCOUNTER — Encounter (HOSPITAL_COMMUNITY): Payer: Self-pay | Admitting: Cardiology

## 2022-06-04 ENCOUNTER — Ambulatory Visit (HOSPITAL_COMMUNITY)
Admission: RE | Admit: 2022-06-04 | Discharge: 2022-06-04 | Disposition: A | Discharge status: Home / Self Care | Payer: 59 | Source: Ambulatory Visit | Attending: Cardiology | Admitting: Cardiology

## 2022-06-04 ENCOUNTER — Encounter (HOSPITAL_COMMUNITY): Payer: Self-pay | Admitting: Cardiology

## 2022-06-04 VITALS — BP 110/70 | HR 90 | Wt 219.0 lb

## 2022-06-04 DIAGNOSIS — I340 Nonrheumatic mitral (valve) insufficiency: Secondary | ICD-10-CM | POA: Insufficient documentation

## 2022-06-04 DIAGNOSIS — Z7901 Long term (current) use of anticoagulants: Secondary | ICD-10-CM | POA: Insufficient documentation

## 2022-06-04 DIAGNOSIS — Z86718 Personal history of other venous thrombosis and embolism: Secondary | ICD-10-CM | POA: Diagnosis not present

## 2022-06-04 DIAGNOSIS — Z79899 Other long term (current) drug therapy: Secondary | ICD-10-CM | POA: Diagnosis not present

## 2022-06-04 DIAGNOSIS — I110 Hypertensive heart disease with heart failure: Secondary | ICD-10-CM | POA: Insufficient documentation

## 2022-06-04 DIAGNOSIS — I5022 Chronic systolic (congestive) heart failure: Secondary | ICD-10-CM | POA: Insufficient documentation

## 2022-06-04 DIAGNOSIS — I428 Other cardiomyopathies: Secondary | ICD-10-CM | POA: Insufficient documentation

## 2022-06-04 DIAGNOSIS — Z7984 Long term (current) use of oral hypoglycemic drugs: Secondary | ICD-10-CM | POA: Diagnosis not present

## 2022-06-04 DIAGNOSIS — Z86711 Personal history of pulmonary embolism: Secondary | ICD-10-CM | POA: Insufficient documentation

## 2022-06-04 LAB — BASIC METABOLIC PANEL
Anion gap: 6 (ref 5–15)
BUN: <5 mg/dL — ABNORMAL LOW (ref 6–20)
CO2: 24 mmol/L (ref 22–32)
Calcium: 8.6 mg/dL — ABNORMAL LOW (ref 8.9–10.3)
Chloride: 108 mmol/L (ref 98–111)
Creatinine, Ser: 1.4 mg/dL — ABNORMAL HIGH (ref 0.61–1.24)
GFR, Estimated: 58 mL/min — ABNORMAL LOW (ref >60)
Glucose, Bld: 115 mg/dL — ABNORMAL HIGH (ref 70–99)
Potassium: 3.9 mmol/L (ref 3.5–5.1)
Sodium: 138 mmol/L (ref 135–145)

## 2022-06-04 LAB — BRAIN NATRIURETIC PEPTIDE: B Natriuretic Peptide: 93.1 pg/mL (ref 0.0–100.0)

## 2022-06-04 MED ORDER — SPIRONOLACTONE 25 MG PO TABS: 12.5000 mg | ORAL_TABLET | Freq: Every day | ORAL | 3 refills | Status: DC

## 2022-06-04 MED ORDER — CARVEDILOL 3.125 MG PO TABS: 3.1250 mg | ORAL_TABLET | Freq: Two times a day (BID) | ORAL | 3 refills | Status: DC

## 2022-06-04 NOTE — Patient Instructions (Signed)
START Carvedilol 3.125 mg Twice daily  START Spironolactone 12.5 mg ( 1/2 Tab) daily.  Labs done today, your results will be available in MyChart, we will contact you for abnormal readings.  Repeat blood work in 10 days.  Please follow up with our heart failure pharmacist in 3 weeks  Your physician recommends that you schedule a follow-up appointment in: 2 months  If you have any questions or concerns before your next appointment please send [place] a message through [place] or call our office at [phone].    TO LEAVE A MESSAGE FOR THE NURSE SELECT OPTION 2, PLEASE LEAVE A MESSAGE INCLUDING: YOUR NAME DATE OF BIRTH CALL BACK NUMBER REASON FOR CALL**this is important as we prioritize the call backs  YOU WILL RECEIVE A CALL BACK THE SAME DAY AS LONG AS YOU CALL BEFORE 4:00 PM  At the [facility], you and your health needs are our priority. As part of our continuing mission to provide you with exceptional heart care, we have created designated Provider Care Teams. These Care Teams include your primary Cardiologist (physician) and Advanced Practice Providers (APPs- Physician Assistants and Nurse Practitioners) who all work together to provide you with the care you need, when you need it.   You may see any of the following providers on your designated Care Team at your next follow up: Dr Glori Bickers Dr Loralie Champagne Dr. Roxana Hires, NP Lyda Jester, [place] [facility] [place], [place] Forestine Na, NP Audry Riles, PharmD   Please be sure to bring in all your medications bottles to every appointment.    Thank you for choosing [facility]

## 2022-06-04 NOTE — Progress Notes (Signed)
PCP: Pa, [facility] Cardiology: Dr. Tamala Julian HF Cardiology: Dr. Aundra Dubin  57 y.o. with history of PE/DVT, bipolar disorder, and chronic systolic CHF was referred by Dr. Tamala Julian for evaluation of CHF. Patient has been on anticoagulation long-term for his h/o PE/DVT.  He does not miss doses.  Suspected clotting disorder, being worked up by hematology for possible antiphospholipid antibody syndrome.  He had initial PE in 2009, Eliquis started.  He had DVT on left while on Eliquis and switched to coumadin. In 9/23, he had a spontaneous left thigh hematoma while INR supratherapeutic.  Warfarin was held and eventually switched to Pradaxa   Due to progressive dyspnea over the last few months, echo was done.  This showed EF 20-25%, severe LV dilation, normal RV, moderate-severe MR. Patient had had coronary CTA in 12/23 with calcium score 49 and mild disease noted in LAD.    TEE in 3/24 showed EF 25-30%, moderate LV dilation, mildly decreased RV function, only mild MR.  LHC/RHC in 3/24 showed mild nonobstructive CAD, normal filling pressures and preserved cardiac output.   Patient returns today for followup of CHF. He is short of breath chronically after walking about 50 yards.  No orthopnea/PND.  No lightheadedness.  He has generalized fatigue. Weight down 5 lbs.   ECG (personally reviewed): NSR, PVC, LAFB, LVH with repolarization abnormality.   Labs (12/23): Pro-BNP 547 Labs (2/24): K 4, creatinine 1.16, BNP 89  PMH: 1. HTN 2. Sleep study in 1/24 did not show significant OSA.  3. H/o DVT and PE: PE 2009 with Eliquis started, DVT on left in 7/23 while on Eliquis, switched to warfarin.  Had large left thigh intramuscular hematoma with supratherapeutic INR.  Now on Pradaxa.  - Venous dopplers in 2/24 showed no DVT 4. Bipolar disorder: on Li.  5. H/o subdural hematoma 10/14 6. Chronic systolic CHF: Echo (0000000) with EF 20-25%, severe LV dilation, normal RV, moderate-severe MR.  - Coronary CTA (12/23):  Calcium score 49, mild stenosis in LAD.  - TEE (3/24): EF 25-30%, moderate LV dilation, mildly decreased RV function, only mild MR. - LHC/RHC (3/24): Mild nonobstructive CAD, mean RA 2, PAP 35/17, mean PCWP 12, CI 2.69 7. Spontaneous large left thigh intramuscular hematoma in setting of supratherapeutic INR on warfarin.  8. ABIs (2/24): normal  FH: No cardiac problems that he knows of.   Social History   Socioeconomic History   Marital status: Single    Spouse name: Not on file   Number of children: Not on file   Years of education: Not on file   Highest education level: Not on file  Occupational History   Occupation: disability  Tobacco Use   Smoking status: Never   Smokeless tobacco: Never  Vaping Use   Vaping Use: Never used  Substance and Sexual Activity   Alcohol use: No   Drug use: No   Sexual activity: Not on file  Other Topics Concern   Not on file  Social History Narrative   FROM [place], [place]    Lives in a 2 story apartment.  His cousin is currently staying with him.  Has 4 children.  On disability for the past 10 years for pulmonary embolism, bipolar disease.  Education: high school.   Social Determinants of Health   Financial Resource Strain: Not on file  Food Insecurity: No Food Insecurity (12/22/2021)   Hunger Vital Sign    Worried About Running Out of Food in the Last Year: Never true    Ran  Out of Food in the Last Year: Never true  Transportation Needs: No Transportation Needs (12/22/2021)   PRAPARE - Hydrologist (Medical): No    Lack of Transportation (Non-Medical): No  Physical Activity: Not on file  Stress: Not on file  Social Connections: Not on file  Intimate Partner Violence: Not At Risk (12/22/2021)   Humiliation, Afraid, Rape, and Kick questionnaire    Fear of Current or Ex-Partner: No    Emotionally Abused: No    Physically Abused: No    Sexually Abused: No   ROS: All systems reviewed and negative except as per  HPI.   Current Outpatient Medications  Medication Sig Dispense Refill   carvedilol (COREG) 3.125 MG tablet Take 1 tablet (3.125 mg total) by mouth 2 (two) times daily. 180 tablet 3   dabigatran (PRADAXA) 150 MG CAPS capsule Take 1 capsule (150 mg total) by mouth 2 (two) times daily. 180 capsule 0   doxepin (SINEQUAN) 75 MG capsule Take 75 mg by mouth at bedtime.     empagliflozin (JARDIANCE) 10 MG TABS tablet Take 1 tablet (10 mg total) by mouth daily before breakfast. 30 tablet 11   HYDROcodone-acetaminophen (NORCO) 7.5-325 MG tablet Take 0.5 tablets by mouth every 6 (six) hours as needed for moderate pain.     hydrOXYzine (ATARAX) 25 MG tablet Take 25 mg by mouth 3 (three) times daily as needed for anxiety.     lithium 600 MG capsule Take 600 mg by mouth at bedtime.     mirtazapine (REMERON) 45 MG tablet Take 45 mg by mouth at bedtime.     pravastatin (PRAVACHOL) 10 MG tablet Take 10 mg by mouth daily.     QUEtiapine (SEROQUEL XR) 400 MG 24 hr tablet Take 800 mg by mouth at bedtime.     sacubitril-valsartan (ENTRESTO) 49-51 MG Take 1 tablet by mouth 2 (two) times daily. 60 tablet 11   spironolactone (ALDACTONE) 25 MG tablet Take 0.5 tablets (12.5 mg total) by mouth daily. 45 tablet 3   tiZANidine (ZANAFLEX) 4 MG tablet Take 4 mg by mouth at bedtime as needed for muscle spasms.     No current facility-administered medications for this encounter.   BP 110/70   Pulse 90   Wt 99.3 kg (219 lb)   SpO2 97%   BMI 32.34 kg/m  General: NAD Neck: No JVD, no thyromegaly or thyroid nodule.  Lungs: Clear to auscultation bilaterally with normal respiratory effort. CV: Nondisplaced PMI.  Heart regular S1/S2, no S3/S4, no murmur.  No peripheral edema.  No carotid bruit.  Normal pedal pulses.  Abdomen: Soft, nontender, no hepatosplenomegaly, no distention.  Skin: Intact without lesions or rashes.  Neurologic: Alert and oriented x 3.  Psych: Normal affect. Extremities: No clubbing or cyanosis.   HEENT: Normal.   Assessment/plan: 1. Venous thromboembolism: PE in 2009, DVT in 7/23 while on Eliquis.  Switched to warfarin and had spontaneous left thigh hematoma while INR supratherapeutic on warfarin.  Now on Pradaxa.   - Continue Pradaxa.  2. Chronic systolic CHF: Nonischemic cardiomyopathy.  Echo (1/24) with EF 20-25%, severe LV dilation, normal RV, moderate-severe MR. TEE in 3/24 showed EF 25-30%, moderate LV dilation, mildly decreased RV function, only mild MR.  LHC/RHC in 3/24 with mild nonobstructive CAD, normal filling pressures, preserved cardiac output.  He seems to have exertional dyspnea out of proportion to exam. He denies history of ETOH or drugs, no family history of cardiomyopathy or SCD.  He  has a history of HTN.  NYHA class III symptoms but not significantly volume overloaded by exam.  - Continue Entresto 49/51 bid.   - Continue Farxiga 10 mg daily.  - Add spironolactone 12.5 daily with BMET/BNP today and BMET in 10 days.  - Add Coreg 3.125 mg bid.  - No Lasix for now.  - We will arrange for cardiac MRI after his spinal stimulator battery is changed and device is thus MRI compatible.  - Repeat echo after titration up of GDMT, will need ICD if EF remains low.  3. Mitral regurgitation: Moderate-severe on 1/24 echo.  However, TEE in 3/24 showed only mild MR.  4. HTN: BP controlled.    Followup in 2 months with APP.    Loralie Champagne 06/04/2022

## 2022-06-18 ENCOUNTER — Ambulatory Visit (HOSPITAL_COMMUNITY)
Admission: RE | Admit: 2022-06-18 | Discharge: 2022-06-18 | Disposition: A | Discharge status: Home / Self Care | Payer: 59 | Source: Ambulatory Visit | Attending: Cardiology | Admitting: Cardiology

## 2022-06-18 DIAGNOSIS — I5022 Chronic systolic (congestive) heart failure: Secondary | ICD-10-CM | POA: Insufficient documentation

## 2022-06-18 LAB — BASIC METABOLIC PANEL
Anion gap: 9 (ref 5–15)
BUN: 7 mg/dL (ref 6–20)
CO2: 25 mmol/L (ref 22–32)
Calcium: 9.1 mg/dL (ref 8.9–10.3)
Chloride: 106 mmol/L (ref 98–111)
Creatinine, Ser: 1.23 mg/dL (ref 0.61–1.24)
GFR, Estimated: >60 mL/min (ref >60)
Glucose, Bld: 100 mg/dL — ABNORMAL HIGH (ref 70–99)
Potassium: 3.9 mmol/L (ref 3.5–5.1)
Sodium: 140 mmol/L (ref 135–145)

## 2022-06-22 ENCOUNTER — Emergency Department (HOSPITAL_COMMUNITY)
Admission: EM | Admit: 2022-06-22 | Discharge: 2022-06-22 | Disposition: A | Discharge status: Home / Self Care | Payer: 59 | Attending: Emergency Medicine | Admitting: Emergency Medicine

## 2022-06-22 ENCOUNTER — Other Ambulatory Visit: Payer: Self-pay

## 2022-06-22 ENCOUNTER — Encounter (HOSPITAL_COMMUNITY): Payer: Self-pay | Admitting: Emergency Medicine

## 2022-06-22 DIAGNOSIS — M5136 Other intervertebral disc degeneration, lumbar region: Secondary | ICD-10-CM | POA: Insufficient documentation

## 2022-06-22 DIAGNOSIS — M5441 Lumbago with sciatica, right side: Secondary | ICD-10-CM | POA: Insufficient documentation

## 2022-06-22 DIAGNOSIS — R202 Paresthesia of skin: Secondary | ICD-10-CM | POA: Diagnosis not present

## 2022-06-22 DIAGNOSIS — M5431 Sciatica, right side: Secondary | ICD-10-CM

## 2022-06-22 DIAGNOSIS — M545 Low back pain, unspecified: Secondary | ICD-10-CM | POA: Diagnosis not present

## 2022-06-22 MED ORDER — PREDNISONE 20 MG PO TABS: ORAL_TABLET | ORAL | 0 refills | Status: DC

## 2022-06-22 MED ORDER — METHOCARBAMOL 750 MG PO TABS: 750.0000 mg | ORAL_TABLET | Freq: Three times a day (TID) | ORAL | 0 refills | Status: DC | PRN

## 2022-06-22 MED ORDER — PREDNISONE 20 MG PO TABS
60.0000 mg | ORAL_TABLET | Freq: Once | ORAL | Status: AC
  Administered 2022-06-22: 60 mg via ORAL
  Filled 2022-06-22: qty 3

## 2022-06-22 MED ORDER — ACETAMINOPHEN 500 MG PO TABS
1000.0000 mg | ORAL_TABLET | Freq: Once | ORAL | Status: AC
  Administered 2022-06-22: 1000 mg via ORAL
  Filled 2022-06-22: qty 2

## 2022-06-22 MED ORDER — OXYCODONE-ACETAMINOPHEN 5-325 MG PO TABS: 1.0000 | ORAL_TABLET | Freq: Once | ORAL | Status: DC

## 2022-06-22 NOTE — Discharge Instructions (Addendum)
It was our pleasure to provide your ER care today - we hope that you feel better.  Avoid heavy lifting > 20 lbs or bending at waist for the next few days. Try heat therapy to sore area in back. Take prednisone as prescribed. Take your pain medication as need. You may also take robaxin as need for pain/muscle pain - no driving for the next 6 hours or when taking robaxin.   Follow up with primary care doctor and back specialist in the next couple weeks.   Return to ER if worse, new symptoms, fevers, severe/intractable pain, numbness/weakness or loss of normal functional ability, chest pain, trouble breathing, or other concern.

## 2022-06-22 NOTE — ED Provider Notes (Signed)
[place] EMERGENCY DEPARTMENT AT [facility] Provider Note   CSN: [id number] Arrival date & time: 06/22/22  1240     History  Chief Complaint  Patient presents with   Back Pain    Justin [name] is a 57 y.o. male.  Pt c/o pain to right lower back radiating to right leg. Pt notes similar symptoms in past, on chronic/recurrent basis, but flared up in past few days. Denies recent injury or strain. No new saddle area or leg numbness. No weakness. No problems normal bowel and bladder control. No fever or chills. No anterior pain, no abd or pelvic pain. Notes intermittent similar symptoms to left upper extremity - occasional tingling sensation to right small finger. No weakness or any loss of normal function. Pt with hx ddd, and remote hx lumbar discectomy/fusion. Is on pradaxa, hx dvt, compliant w meds. No new extremity swelling.   The history is provided by the patient and medical records.  Back Pain Associated symptoms: no abdominal pain, no chest pain, no fever, no headaches and no weakness        Home Medications Prior to Admission medications   Medication Sig Start Date End Date Taking? Authorizing Provider  carvedilol (COREG) 3.125 MG tablet Take 1 tablet (3.125 mg total) by mouth 2 (two) times daily. 06/04/22   Laurey Morale, MD  dabigatran (PRADAXA) 150 MG CAPS capsule Take 1 capsule (150 mg total) by mouth 2 (two) times daily. 05/19/22   Laurey Morale, MD  doxepin (SINEQUAN) 75 MG capsule Take 75 mg by mouth at bedtime. 12/14/19   [provider]  empagliflozin (JARDIANCE) 10 MG TABS tablet Take 1 tablet (10 mg total) by mouth daily before breakfast. 04/27/22   Laurey Morale, MD  HYDROcodone-acetaminophen (NORCO) 7.5-325 MG tablet Take 0.5 tablets by mouth every 6 (six) hours as needed for moderate pain. 05/14/22   [provider]  hydrOXYzine (ATARAX) 25 MG tablet Take 25 mg by mouth 3 (three) times daily as needed for anxiety. 04/25/22   [provider]  lithium 600 MG capsule Take 600 mg by mouth at bedtime.    [provider]  mirtazapine (REMERON) 45 MG tablet Take 45 mg by mouth at bedtime.    [provider]  pravastatin (PRAVACHOL) 10 MG tablet Take 10 mg by mouth daily. 05/12/22   [provider]  QUEtiapine (SEROQUEL XR) 400 MG 24 hr tablet Take 800 mg by mouth at bedtime. 11/16/21   [provider]  sacubitril-valsartan (ENTRESTO) 49-51 MG Take 1 tablet by mouth 2 (two) times daily. 04/23/22   Christell Constant, MD  spironolactone (ALDACTONE) 25 MG tablet Take 0.5 tablets (12.5 mg total) by mouth daily. 06/04/22   Laurey Morale, MD  tiZANidine (ZANAFLEX) 4 MG tablet Take 4 mg by mouth at bedtime as needed for muscle spasms. 12/17/19   [provider]      Allergies    Lyrica [pregabalin]    Review of Systems   Review of Systems  Constitutional:  Negative for fever.  Eyes:  Negative for visual disturbance.  Respiratory:  Negative for shortness of breath.   Cardiovascular:  Negative for chest pain.  Gastrointestinal:  Negative for abdominal pain.  Genitourinary:  Negative for difficulty urinating.  Musculoskeletal:  Positive for back pain.  Skin:  Negative for rash.  Neurological:  Negative for speech difficulty, weakness and headaches.    Physical Exam Updated Vital Signs BP 113/77   Pulse  88   Temp 97.7 F (36.5 C)   Resp 18   Ht 1.753 m (5\' 9" )   Wt 99.3 kg   SpO2 99%   BMI 32.34 kg/m  Physical Exam Vitals and nursing note reviewed.  Constitutional:      Appearance: Normal appearance. He is well-developed.  HENT:     Head: Atraumatic.     Nose: Nose normal.     Mouth/Throat:     Mouth: Mucous membranes are moist.     Pharynx: Oropharynx is clear.  Eyes:     General: No scleral icterus.    Conjunctiva/sclera: Conjunctivae normal.     Pupils: Pupils are equal, round, and reactive to light.  Neck:     Vascular: No carotid bruit.     Trachea:  No tracheal deviation.  Cardiovascular:     Rate and Rhythm: Normal rate and regular rhythm.     Pulses: Normal pulses.     Heart sounds: Normal heart sounds. No murmur heard.    No friction rub. No gallop.  Pulmonary:     Effort: Pulmonary effort is normal. No accessory muscle usage or respiratory distress.     Breath sounds: Normal breath sounds.  Abdominal:     General: There is no distension.     Palpations: Abdomen is soft. There is no mass.     Tenderness: There is no abdominal tenderness.  Genitourinary:    Comments: Right lumbar muscular tenderness. No midline/spine tenderness. CTLS spine, non tender, aligned, no step off. Good passive rom RLE without pain. No edema of extremities, including LUE and RLE. Extremities of normal color and warmth, distal pulses palp.  Musculoskeletal:        General: No swelling.     Cervical back: Normal range of motion and neck supple. No rigidity or tenderness.  Skin:    General: Skin is warm and dry.     Findings: No rash.  Neurological:     Mental Status: He is alert.     Comments: Alert, speech clear. Motor/sens grossly intact bil. Stre 5/5. Sens grossly intact. Reflexes symmetric. Steady gait.   Psychiatric:        Mood and Affect: Mood normal.     ED Results / Procedures / Treatments   Labs (all labs ordered are listed, but only abnormal results are displayed) Labs Reviewed - No data to display  EKG None  Radiology No results found.  Procedures Procedures    Medications Ordered in ED Medications  predniSONE (DELTASONE) tablet 60 mg (has no administration in time range)  oxyCODONE-acetaminophen (PERCOCET/ROXICET) 5-325 MG per tablet 1 tablet (has no administration in time range)    ED Course/ Medical Decision Making/ A&P                             Medical Decision Making Problems Addressed: Degenerative disc disease, lumbar: acute illness or injury    Details: Acute on chronic Paresthesia: acute illness or  injury Sciatica of right side: acute illness or injury with systemic symptoms that poses a threat to life or bodily functions  Amount and/or Complexity of Data Reviewed External Data Reviewed: notes. Labs:  Decision-making details documented in ED Course.  Risk Prescription drug management.   Reviewed nursing notes and prior charts for additional history. Last four dvt studies including 04/2022 neg for acute dvt.   [facility], reviewed/interpreted by me - chem/k/ca normal.  History/exam felt most c/w ddd, possible nerve impingement.   Prednisone po. Percocet po.   Pt comfortable appearing.   No edema to extremities. No focal bony tenderness. Normal appearance. Compliant w meds incl anticoag therapy.  Rx for home, prednisone/robaxin (pt recently filled a 30 day supply hydrocodone)  Pt currently appears stable for d/c.  Return precautions provided.         Final Clinical Impression(s) / ED Diagnoses Final diagnoses:  None    Rx / DC Orders ED Discharge Orders     None         Cathren Laine, MD 06/22/22 1558

## 2022-06-22 NOTE — ED Notes (Signed)
Patient verbalizes understanding of discharge instructions. Opportunity for questioning and answers were provided. Pt discharged from ED. 

## 2022-06-22 NOTE — ED Triage Notes (Signed)
Patient arrives ambulatory by POV c/o right lower back pain radiating down right leg. Patient also has spot on his right foot that he is concerned for a blood clot x 2 days.

## 2022-06-25 ENCOUNTER — Other Ambulatory Visit (HOSPITAL_COMMUNITY): Payer: Self-pay

## 2022-06-26 ENCOUNTER — Other Ambulatory Visit (HOSPITAL_COMMUNITY): Payer: Self-pay | Admitting: Pharmacist

## 2022-06-26 ENCOUNTER — Other Ambulatory Visit (HOSPITAL_COMMUNITY): Payer: Self-pay

## 2022-06-26 ENCOUNTER — Ambulatory Visit (HOSPITAL_COMMUNITY)
Admission: RE | Admit: 2022-06-26 | Discharge: 2022-06-26 | Disposition: A | Discharge status: Home / Self Care | Payer: 59 | Source: Ambulatory Visit | Attending: Cardiology | Admitting: Cardiology

## 2022-06-26 VITALS — BP 138/74 | HR 82 | Resp 97 | Wt 216.6 lb

## 2022-06-26 DIAGNOSIS — R0602 Shortness of breath: Secondary | ICD-10-CM | POA: Diagnosis not present

## 2022-06-26 DIAGNOSIS — Z7984 Long term (current) use of oral hypoglycemic drugs: Secondary | ICD-10-CM | POA: Insufficient documentation

## 2022-06-26 DIAGNOSIS — Z79899 Other long term (current) drug therapy: Secondary | ICD-10-CM | POA: Insufficient documentation

## 2022-06-26 DIAGNOSIS — I5022 Chronic systolic (congestive) heart failure: Secondary | ICD-10-CM | POA: Diagnosis not present

## 2022-06-26 DIAGNOSIS — Z7901 Long term (current) use of anticoagulants: Secondary | ICD-10-CM | POA: Insufficient documentation

## 2022-06-26 DIAGNOSIS — I110 Hypertensive heart disease with heart failure: Secondary | ICD-10-CM | POA: Diagnosis not present

## 2022-06-26 DIAGNOSIS — F319 Bipolar disorder, unspecified: Secondary | ICD-10-CM | POA: Insufficient documentation

## 2022-06-26 DIAGNOSIS — I428 Other cardiomyopathies: Secondary | ICD-10-CM | POA: Insufficient documentation

## 2022-06-26 DIAGNOSIS — I340 Nonrheumatic mitral (valve) insufficiency: Secondary | ICD-10-CM | POA: Insufficient documentation

## 2022-06-26 DIAGNOSIS — Z86711 Personal history of pulmonary embolism: Secondary | ICD-10-CM | POA: Insufficient documentation

## 2022-06-26 DIAGNOSIS — R5383 Other fatigue: Secondary | ICD-10-CM | POA: Insufficient documentation

## 2022-06-26 DIAGNOSIS — Z86718 Personal history of other venous thrombosis and embolism: Secondary | ICD-10-CM | POA: Diagnosis not present

## 2022-06-26 DIAGNOSIS — I2510 Atherosclerotic heart disease of native coronary artery without angina pectoris: Secondary | ICD-10-CM | POA: Insufficient documentation

## 2022-06-26 MED ORDER — SPIRONOLACTONE 25 MG PO TABS: 25.0000 mg | ORAL_TABLET | Freq: Every day | ORAL | 11 refills | Status: DC

## 2022-06-26 MED ORDER — EMPAGLIFLOZIN 10 MG PO TABS
10.0000 mg | ORAL_TABLET | Freq: Every day | ORAL | 11 refills | Status: DC
  Filled 2022-06-26: qty 30, 30d supply, fill #0

## 2022-06-26 MED ORDER — CARVEDILOL 6.25 MG PO TABS: 6.2500 mg | ORAL_TABLET | Freq: Two times a day (BID) | ORAL | 11 refills | Status: DC

## 2022-06-26 MED ORDER — ENTRESTO 49-51 MG PO TABS
1.0000 | ORAL_TABLET | Freq: Two times a day (BID) | ORAL | 0 refills | Status: DC
  Filled 2022-06-26: qty 60, 30d supply, fill #0

## 2022-06-26 NOTE — Progress Notes (Signed)
Advanced Heart Failure Clinic Note  PCP: Pa, [facility] Cardiology: Dr. Katrinka Blazing HF Cardiology: Dr. Shirlee Latch  HPI:  57 y.o. with history of PE/DVT, bipolar disorder, and chronic systolic CHF was referred by Dr. Katrinka Blazing for evaluation of CHF. Patient has been on anticoagulation long-term for his h/o PE/DVT.  He does not miss doses.  Suspected clotting disorder, being worked up by hematology for possible antiphospholipid antibody syndrome.  He had initial PE in 2009, Eliquis started.  He had DVT on left while on Eliquis and switched to coumadin. In 11/2021, he had a spontaneous left thigh hematoma while INR supratherapeutic.  Warfarin was held and eventually switched to Pradaxa    Due to progressive dyspnea over the last few months, echo was done 03/2022.  This showed EF 20-25%, severe LV dilation, normal RV, moderate-severe MR. Patient had had coronary CTA in 02/2022 with calcium score 49 and mild disease noted in LAD.     TEE in 05/2022 showed EF 25-30%, moderate LV dilation, mildly decreased RV function, only mild MR.  LHC/RHC in 05/2022 showed mild nonobstructive CAD, normal filling pressures and preserved cardiac output.    At last visit in ADHF clinic on 06/04/22, patient reported SOB chronically after walking about 50 yards.  No orthopnea/PND.  No lightheadedness. Reported generalized fatigue.   Today he returns to HF clinic for pharmacist medication titration. At last visit with MD, patient was started on carvedilol 3.125 mg BID and spironolactone 12.5 mg once daily. Of note, patient is taking spironolactone 25 mg daily. Overall feeling okay. Denies dizziness and lightheadedness. Reports frequent fatigue recently above baseline stating that he will often fall asleep when he sits. Denies chest pain or palpitations. Reports frequent SOB above baseline. Often has to take breaks in activity d/t this. Weight at home 216 pounds. No LEE on exam. Denies PND/Orthopnea. Appetite has reduced recently. He may  go all day without eating a meal. Watching his salt intake.   Of note, he is currently on lithium and Entresto, of which there is a DDI. Entresto can increase concentration of lithium and lead to lithium toxicity. Patient endorses adherence to lithium, but was unable to confirm Entresto. Confirmed fill history for [facility] with his home pharmacy. Patient denies AMS, GI symptoms and other signs of lithium toxicity. Lithium is managed by NP Arlana Hove at [facility]. LVM with the office to confirm lithium levels and inform of DDI, but they have not returned the call. Per patient, he sees this provider virtually and does not receive lithium levels. Last lithium level documented in Epic was 0.27 on 12/22/21; Sherryll Burger was initiated on 04/27/22.  HF Medications: Carvedilol 3.125 mg BID Entresto 49/51 mg BID  Spironolactone 12.5 mg once daily (taking 25 mg daily) Jardiance 10 mg once daily  Has the patient been experiencing any side effects to the medications prescribed?  No  Does the patient have any problems obtaining medications due to transportation or finances?   Hospital doctor and secondary Medicaid  Understanding of regimen: poor Understanding of indications: good Potential of compliance: fair Patient understands to avoid NSAIDs. Patient understands to avoid decongestants.    Pertinent Lab Values: 06/18/22: Serum creatinine 1.23, BUN 7, Potassium 3.9, Sodium 140 Vital Signs: Weight: 216 lbs (last clinic weight: 219) Blood pressure: 138/74  Heart rate: 82   Assessment/Plan: 1. Chronic systolic CHF: Nonischemic cardiomyopathy.  Echo (03/2022) with EF 20-25%, severe LV dilation, normal RV, moderate-severe MR. TEE in 05/2022 showed EF 25-30%, moderate LV  dilation, mildly decreased RV function, only mild MR.  LHC/RHC in 05/2022 with mild nonobstructive CAD, normal filling pressures, preserved cardiac output. Denies history of ETOH or drugs, no family history of  cardiomyopathy or SCD.  History of HTN.   - NYHA class III symptoms but not volume overloaded by exam. Will need to obtain updated lithium levels since initiation of Entresto; no current concerning symptoms of toxicity. Patient appears uncertain of current medications. - No Lasix for now.  - Increase carvedilol to 6.25 mg BID  - Continue Entresto 49/51 BID. Monitor lithium levels carefully with DDI (Entresto can increase Lithium concentration) - Continue spironolactone 25 mg daily - Continue Jardiance 10 mg daily.  - Dr. Shirlee Latch plans to arrange cardiac MRI after his spinal stimulator battery is changed and device is thus MRI compatible.  - Repeat echo after titration up of GDMT, will need ICD if EF remains low.  -Consider referral to HF Paramedicine for help managing medications.   2. Venous thromboembolism: PE in 2009, DVT in 09/2021 while on Eliquis.  Switched to warfarin and had spontaneous left thigh hematoma while INR supratherapeutic on warfarin.  Now on Pradaxa.   - Continue Pradaxa.   3. Mitral regurgitation: Moderate-severe on 03/2022 echo.  However, TEE in 05/2022 showed only mild MR.   4. HTN: BP close to goal - Increase carvedilol to 6.25 mg once daily  5. Bipolar disorder: currently managed on lithium and quetiapine. DDI between lithium and Entresto.  -Continue lithium 600 mg once daily at bedtime -Continue quetiapine 800 mg once daily at bedtime -Have reached out to NP who prescribes Lithium to obtain records for patient and inform them that he has started [place]. Will ask them to order lithium level and continue to manage closely while he is on Entresto. If we do not get a response from their office, will plan on  ordering a lithium level at visit next week (will order 12 hour level). Will also place referral to psych for help managing lithium if cannot contact other office.  - Educated patient to reach out to [facility] counseling to inform them of the DDI with Entresto and  obtain recent level - Instructed patient to present to the ED if any s/sx of lithium toxicity present.  Follow up 07/05/22 with pharmacy clinic. Instructed to present with all medications at this time.    Irish Elders, PharmD PGY-1 [facility] Pharmacy Resident  Karle Plumber, PharmD, BCPS, [facility], CPP Heart Failure Clinic Pharmacist [phone]

## 2022-06-26 NOTE — Patient Instructions (Signed)
It was a pleasure seeing you today!  MEDICATIONS: -We are changing your medications today - Increase carvedilol to 6.25 mg twice daily -Call if you have questions about your medications.  LABS: -We will call you if your labs need attention.  NEXT APPOINTMENT: Return to clinic in one week with pharmacy clinic. - Please bring your pill bottles with you to this visit.  In general, to take care of your heart failure: -Limit your fluid intake to 2 Liters (half-gallon) per day.   -Limit your salt intake to ideally 2-3 grams (2000-3000 mg) per day. -Weigh yourself daily and record, and bring that "weight diary" to your next appointment.  (Weight gain of 2-3 pounds in 1 day typically means fluid weight.) -The medications for your heart are to help your heart and help you live longer.   -Please contact us before stopping any of your heart medications.  Call the clinic at [phone] with questions or to reschedule future appointments.

## 2022-07-03 ENCOUNTER — Emergency Department (HOSPITAL_COMMUNITY)
Admission: EM | Admit: 2022-07-03 | Discharge: 2022-07-04 | Discharge status: Against Medical Advice | Payer: 59 | Attending: Emergency Medicine | Admitting: Emergency Medicine

## 2022-07-03 ENCOUNTER — Emergency Department (HOSPITAL_COMMUNITY): Payer: 59

## 2022-07-03 ENCOUNTER — Other Ambulatory Visit: Payer: Self-pay

## 2022-07-03 DIAGNOSIS — Z5321 Procedure and treatment not carried out due to patient leaving prior to being seen by health care provider: Secondary | ICD-10-CM | POA: Insufficient documentation

## 2022-07-03 DIAGNOSIS — R0789 Other chest pain: Secondary | ICD-10-CM | POA: Diagnosis not present

## 2022-07-03 DIAGNOSIS — M79661 Pain in right lower leg: Secondary | ICD-10-CM | POA: Diagnosis not present

## 2022-07-03 DIAGNOSIS — R0602 Shortness of breath: Secondary | ICD-10-CM | POA: Diagnosis not present

## 2022-07-03 DIAGNOSIS — R079 Chest pain, unspecified: Secondary | ICD-10-CM | POA: Insufficient documentation

## 2022-07-03 DIAGNOSIS — Z7901 Long term (current) use of anticoagulants: Secondary | ICD-10-CM | POA: Insufficient documentation

## 2022-07-03 LAB — BASIC METABOLIC PANEL
Anion gap: 10 (ref 5–15)
BUN: 9 mg/dL (ref 6–20)
CO2: 25 mmol/L (ref 22–32)
Calcium: 9.1 mg/dL (ref 8.9–10.3)
Chloride: 102 mmol/L (ref 98–111)
Creatinine, Ser: 1.38 mg/dL — ABNORMAL HIGH (ref 0.61–1.24)
GFR, Estimated: 59 mL/min — ABNORMAL LOW (ref >60)
Glucose, Bld: 143 mg/dL — ABNORMAL HIGH (ref 70–99)
Potassium: 3.7 mmol/L (ref 3.5–5.1)
Sodium: 137 mmol/L (ref 135–145)

## 2022-07-03 LAB — CBC
HCT: 40.7 % (ref 39.0–52.0)
Hemoglobin: 13.2 g/dL (ref 13.0–17.0)
MCH: 27.6 pg (ref 26.0–34.0)
MCHC: 32.4 g/dL (ref 30.0–36.0)
MCV: 85.1 fL (ref 80.0–100.0)
Platelets: 300 10*3/uL (ref 150–400)
RBC: 4.78 MIL/uL (ref 4.22–5.81)
RDW: 15.1 % (ref 11.5–15.5)
WBC: 15.2 10*3/uL — ABNORMAL HIGH (ref 4.0–10.5)
nRBC: 0 % (ref 0.0–0.2)

## 2022-07-03 LAB — TROPONIN I (HIGH SENSITIVITY): Troponin I (High Sensitivity): 20 ng/L — ABNORMAL HIGH (ref <18)

## 2022-07-03 LAB — BRAIN NATRIURETIC PEPTIDE: B Natriuretic Peptide: 68 pg/mL (ref 0.0–100.0)

## 2022-07-03 NOTE — ED Triage Notes (Signed)
Pt reports two days of bilateral groin pain, leg pain behind R knee down into calf, chest pain worse with exertion, and DOE.

## 2022-07-03 NOTE — ED Provider Triage Note (Signed)
Emergency Medicine Provider Triage Evaluation Note  Justin [name] , a 57 y.o. male  was evaluated in triage.  Pt complains of left-sided chest pain and shortness of breath that has been worsening for the last 2 days.  States that he also has a history of blood clots that only develop in his right leg and has been having pain to his posterior knee and down his right calf.  He is on Pradaxa and has been compliant with this medication.  He denies associated fever, chills, cough, congestion, abdominal pain, or syncope.  Review of Systems  Positive: See HPI Negative: See HPI  Physical Exam  BP (!) 145/97 (BP Location: Right Arm)   Pulse 93   Temp 98.4 F (36.9 C)   Resp 16   SpO2 100%  Gen:   Awake, no distress   Resp:  Normal effort lungs clear to auscultation left MSK:   Moves extremities without difficulty mild right calf tenderness, no significant lower extremity edema, 2+ DP and PT pulses bilaterally, lower extremities appear well-perfused and warm, soft compartments Other:  Abdomen soft, nontender, and without rebound, guarding, or peritoneal signs, patient neurologically intact and without signs of respiratory or other distress  Medical Decision Making  Medically screening exam initiated at 7:00 PM.  Appropriate orders placed.  [name] was informed that the remainder of the evaluation will be completed by another provider, this initial triage assessment does not replace that evaluation, and the importance of remaining in the ED until their evaluation is complete.     Tonette Lederer, PA-C 07/03/22 1901

## 2022-07-05 ENCOUNTER — Encounter (HOSPITAL_COMMUNITY): Payer: Self-pay

## 2022-07-05 ENCOUNTER — Ambulatory Visit (HOSPITAL_COMMUNITY)
Admission: RE | Admit: 2022-07-05 | Discharge: 2022-07-05 | Disposition: A | Discharge status: Home / Self Care | Payer: 59 | Source: Ambulatory Visit | Attending: Internal Medicine | Admitting: Internal Medicine

## 2022-07-05 VITALS — BP 132/74 | HR 101 | Wt 214.2 lb

## 2022-07-05 DIAGNOSIS — I5022 Chronic systolic (congestive) heart failure: Secondary | ICD-10-CM | POA: Diagnosis not present

## 2022-07-05 DIAGNOSIS — Z7984 Long term (current) use of oral hypoglycemic drugs: Secondary | ICD-10-CM | POA: Diagnosis not present

## 2022-07-05 DIAGNOSIS — Z86718 Personal history of other venous thrombosis and embolism: Secondary | ICD-10-CM | POA: Insufficient documentation

## 2022-07-05 DIAGNOSIS — Z86711 Personal history of pulmonary embolism: Secondary | ICD-10-CM | POA: Insufficient documentation

## 2022-07-05 DIAGNOSIS — F319 Bipolar disorder, unspecified: Secondary | ICD-10-CM | POA: Insufficient documentation

## 2022-07-05 DIAGNOSIS — I340 Nonrheumatic mitral (valve) insufficiency: Secondary | ICD-10-CM | POA: Insufficient documentation

## 2022-07-05 DIAGNOSIS — Z79899 Other long term (current) drug therapy: Secondary | ICD-10-CM | POA: Insufficient documentation

## 2022-07-05 DIAGNOSIS — I2510 Atherosclerotic heart disease of native coronary artery without angina pectoris: Secondary | ICD-10-CM | POA: Insufficient documentation

## 2022-07-05 DIAGNOSIS — I428 Other cardiomyopathies: Secondary | ICD-10-CM | POA: Diagnosis not present

## 2022-07-05 DIAGNOSIS — M79604 Pain in right leg: Secondary | ICD-10-CM | POA: Insufficient documentation

## 2022-07-05 DIAGNOSIS — I110 Hypertensive heart disease with heart failure: Secondary | ICD-10-CM | POA: Diagnosis not present

## 2022-07-05 LAB — LITHIUM LEVEL: Lithium Lvl: 0.75 mmol/L (ref 0.60–1.20)

## 2022-07-05 NOTE — Progress Notes (Signed)
Heart and Vascular Care Navigation  07/05/2022  Justin [name] 31-Mar-1965 [id number]  Reason for Referral: paramedicine   Engaged with patient face to face for initial visit for Heart and Vascular Care Coordination.                                                                                                   Assessment:  Pt being referred to paramedicine due to lack of knowledge about his medications and issues taking medications as prescribed.  Pt is agreeable to paramedicine referral at this time.                                      HRT/VAS Care Coordination     Patients Home Cardiology Office Heart Failure Clinic   Outpatient Care Team Social Worker; [facility] Paramedic Name: Kerry Hough [phone]   Social Worker Name: Naaman Plummer, [phone]   Living arrangements for the past 2 months Apartment   Lives with: Adult Children   Patient Current Insurance Coverage Medicaid   Patient Has Concern With Paying Medical Bills No   Does Patient Have Prescription Coverage? Yes   Home Assistive Devices/Equipment None   DME Agency [employer]   Current home services DME       Social History:                                                                             SDOH Screenings   Food Insecurity: No Food Insecurity (07/05/2022)  Housing: Low Risk  (07/05/2022)  Transportation Needs: No Transportation Needs (07/05/2022)  Utilities: Not At Risk (07/05/2022)  Tobacco Use: Low Risk  (07/05/2022)    SDOH Interventions: Financial Resources:    Pt receives SSI- believe pt son also receives   Arts administrator Insecurity:  Food Insecurity Interventions: Intervention Not Indicated  Housing Insecurity:  Housing Interventions: Intervention Not Indicated  Transportation:   Transportation Interventions: Intervention Not Indicated     Paramedicine Initial Assessment:  Housing:  In what kind of housing do you live? House/apt/trailer/shelter? apartment  Do you  rent/pay a mortgage/own? rent  Do you live with anyone? Son- 69 years old with down syndrome  Are you currently worried about losing your housing? no  Social:  What is your current marital status?  Not assessed  Do you have any children? son  Do you have family or friends who live locally? 3 great aunts  Food:  No- gets food stamps  Income:  What is your current source of income? Disability- [phone]   Insurance:  Are you currently insured? Yes-   Do you have prescription coverage? yes  Transportation:  Do you have transportation to your medical appointments? Drives- no issues getting  to appt   Daily Health Needs: Do you have a working scale at home? No- provided one in clinic  How do you manage your medications at home? Takes them out of the bottle  Do you ever take your medications differently than prescribed? Yes misses doses accidentally    Do you have any concerns with mobility at home? no  Do you use any assistive devices at home or have PCS at home? no  Do you have a PCP? [facility]- Dr. Janey Genta  Do you have any trouble reading or writing? yes  Do you currently use tobacco products or have recently quit? no  Do you currently see any mental health providers? Goes to [facility] for prescriptions- every 3 months  Are there any additional barriers you see to getting the care you need? Not at this time  CSW will continue to follow through paramedicine program and assist as needed.  Will send referral to paramedics for assignment and follow up.  Burna Sis, LCSW Clinical Social Worker Advanced Heart Failure Clinic Desk#: [phone] Cell#: [phone]

## 2022-07-05 NOTE — Progress Notes (Signed)
Advanced Heart Failure Clinic Note  PCP: [facility], [facility] Cardiology: Dr. Katrinka Blazing HF Cardiology: Dr. Shirlee Latch  HPI:  57 y.o. with history of PE/DVT, bipolar disorder, and chronic systolic CHF was referred by Dr. Katrinka Blazing for evaluation of CHF. Patient has been on anticoagulation long-term for his h/o PE/DVT.  He does not miss doses.  Suspected clotting disorder, being worked up by hematology for possible antiphospholipid antibody syndrome.  He had initial PE in 2009, Eliquis started.  He had DVT on left while on Eliquis and switched to coumadin. In 11/2021, he had a spontaneous left thigh hematoma while INR supratherapeutic.  Warfarin was held and eventually switched to Pradaxa    Due to progressive dyspnea over the last few months, echo was done 03/2022.  This showed EF 20-25%, severe LV dilation, normal RV, moderate-severe MR. Patient had had coronary CTA in 02/2022 with calcium score 49 and mild disease noted in LAD.     TEE in 05/2022 showed EF 25-30%, moderate LV dilation, mildly decreased RV function, only mild MR.  LHC/RHC in 05/2022 showed mild nonobstructive CAD, normal filling pressures and preserved cardiac output.    At last visit in ADHF clinic on 06/04/22, patient reported SOB chronically after walking about 50 yards.  No orthopnea/PND.  No lightheadedness. Reported generalized fatigue.    He returned to pharmacy clinic on 06/26/22. He was taking spironolactone 25 mg daily. Denied dizziness and lightheadedness. Reported frequent fatigue recently above baseline stating that he will often fall asleep when he sits. Denied CP or palpitations. Reported frequent SOB above baseline, requiring breaks in activity d/t this. Weight at home was 216 pounds. No LEE on exam. Denied PND/Orthopnea. Appetite recently reduced, leading to lasting all day without eating a meal. Taking lithium and Entresto of which there is a DDI. Entresto can increase concentration of lithium and lead to lithium toxicity. Patient  endorsed adherence to lithium, but was unable to confirm Entresto. Confirmed fill history for [facility] with his home pharmacy. Patient denied AMS, GI symptoms and other signs of lithium toxicity. Lithium is managed by NP Arlana Hove at [facility]. LVM with the office to confirm lithium levels and inform of DDI. Recommended they order a lithium level and manage closely while he is on Entresto. Per patient, he sees this provider virtually and does not receive lithium levels. Last lithium level documented in Epic was 0.27 on 12/22/21; Sherryll Burger was initiated on 04/27/22.    He was seen in the ED on 07/03/22 and was evaluated for SOB, chest pain, and right leg pain. EKG and trops at that time were unremarkable and patient was discharged. BNP was 68.0.  Today he returns to HF clinic for pharmacist medication titration. At last visit with pharmacy team, spironolactone dose was adjusted to the dose the patient is taking. Additionally, his carvedilol dose was increased to 6.25 mg BID. Patient presented to clinic today with his son. He forgot to bring in his medications to the visit today. Spent a period of time attempting to confirm which medications he is taking. He is certain of his medication adherence for his bipolar medications, however the only other medication he was able to confirm that he is taking twice daily as prescribed is Entresto. He does not believe he is taking carvedilol or Pradaxa in the evening. He only took his Entresto this morning. His last lithium dose was on 07/04/22 at 9:30-10pm. He has been feeling very fatigued overall. Denies dizziness, lightheadedness, or headache. Endorses frequent fatigue. Endorses palpitations  and chest pain, which he described as sharp. Continues to endorse right leg pain. He reports SOB at baseline, requiring frequent breaks during activity. He is able to walk about 5-6 steps at a time and then must stop to catch his breath. Does not have scale at home. No LEE  on exam. Denies PND/Orthopnea. Reports his appetite is at baseline. He will typically eat once a day.   HF Medications: Carvedilol 6.25 mg BID (taking once daily) Entresto 49/51 mg BID  Spironolactone 25 mg once daily Jardiance 10 mg once daily  Has the patient been experiencing any side effects to the medications prescribed?  No  Does the patient have any problems obtaining medications due to transportation or finances?   Hospital doctor and secondary Medicaid   Understanding of regimen: poor Understanding of indications: fair Potential of compliance: poor Patient understands to avoid NSAIDs. Patient understands to avoid decongestants.    Pertinent Lab Values: 07/03/22: Serum creatinine 1.38, BUN 9, Potassium 3.7, Sodium 137, BNP 68 Lithium level 0.75 today  Vital Signs: Weight: 214 lbs (last clinic weight: 216 lbs) Blood pressure: 132/74  Heart rate: 101   Assessment/Plan: 1. Chronic systolic CHF: Nonischemic cardiomyopathy.  Echo (03/2022) with EF 20-25%, severe LV dilation, normal RV, moderate-severe MR. TEE in 05/2022 showed EF 25-30%, moderate LV dilation, mildly decreased RV function, only mild MR.  LHC/RHC in 05/2022 with mild nonobstructive CAD, normal filling pressures, preserved cardiac output. Denies history of ETOH or drugs, no family history of cardiomyopathy or SCD.  History of HTN.   - NYHA class III symptoms but not volume overloaded by exam. Patient appears uncertain of adherence to current medications, aside from Oasis.  - No Lasix for now.  - Continue carvedilol 6.25 mg BID  - Continue Entresto 49/51 mg BID. Lithium level WNL at 0.75 after initiation of Entresto. Would recommend repeating lithium level before and after increasing Entresto dose in the future.  - Continue spironolactone 25 mg daily - Continue Jardiance 10 mg daily.  - Dr. Shirlee Latch plans to arrange cardiac MRI after his spinal stimulator battery is changed and device is thus MRI  compatible.  - Repeat echo after titration up of GDMT, will need ICD if EF remains low.  -Referral placed to HF Paramedicine for help managing medications.  - Scale provided today   2. Venous thromboembolism: PE in 2009, DVT in 09/2021 while on Eliquis.  Switched to warfarin and had spontaneous left thigh hematoma while INR supratherapeutic on warfarin.  Now on Pradaxa. Continues to report leg pain, but does not appear to be taking Pradaxa BID as prescribed. Was seen in ED on 07/03/22 without concern for new DVT per provider.  - Continue Pradaxa 150 mg BID. Instructed to take as prescribed.   3. Mitral regurgitation: Moderate-severe on 03/2022 echo.  However, TEE in 05/2022 showed only mild MR.    4. HTN: BP close to goal - Instructed to take medications as prescribed. Referred to HF paramedicine to help with compliance.    5. Bipolar disorder: currently managed on lithium and quetiapine. DDI between lithium and Entresto.  -Continue lithium 600 mg once daily at bedtime -Continue quetiapine 800 mg once daily at bedtime - Lithium trough level 0.75 today, which is within normal limits. Lithium trough target for mood stabilization is typically 0.6 -1.0 mmol/L. Continue current regimen. Lithium is managed by NP Arlana Hove at [facility]. Have had difficulty contacting his provider over the last week to inform him of interaction and  need for increased surveillance of lithium levels. If level ever comes back as supratherapeutic, will likely need to consult psych provider within [facility]. Would recommend monitoring Lithium levels periodically and before and after Entresto dose changes.     Follow up 07/20/22 with HF APP Clinic  Irish Elders, PharmD PGY-1 [facility] Pharmacy Resident  Karle Plumber, PharmD, BCPS, [facility], CPP Heart Failure Clinic Pharmacist [phone]

## 2022-07-05 NOTE — Patient Instructions (Addendum)
It was a pleasure seeing you today!  MEDICATIONS: -No medication changes today -Continue carvedilol 6.25 mg twice daily  - Continue Entresto 49/51 twice daily - Continue spironolactone 25 mg daily - Continue Jardiance 10 mg daily.  -Call if you have questions about your medications.  LABS: -We will call you if your labs need attention.  NEXT APPOINTMENT: Return to clinic on 07/20/22 at 9 am with heart failure clinic.  In general, to take care of your heart failure: -Limit your fluid intake to 2 Liters (half-gallon) per day.   -Limit your salt intake to ideally 2-3 grams (2000-3000 mg) per day. -Weigh yourself daily and record, and bring that "weight diary" to your next appointment.  (Weight gain of 2-3 pounds in 1 day typically means fluid weight.) -The medications for your heart are to help your heart and help you live longer.   -Please contact us before stopping any of your heart medications.  Call the clinic at [phone] with questions or to reschedule future appointments.

## 2022-07-10 ENCOUNTER — Other Ambulatory Visit (HOSPITAL_COMMUNITY): Payer: Self-pay

## 2022-07-10 NOTE — Progress Notes (Signed)
Paramedicine Encounter    Patient ID: Justin [name], male    DOB: 11/11/1965, 57 y.o.   MRN: [id number]    SOCIAL/MEDICAL BARRIERS:  PHARMACY USED -[facility]   PCP-[facility]    INSURANCE-[employer]   MED ISSUES:  AFFORDABILITY-none -waives copays   PT ASSIST APPS NEEDED-none   SOURCE OF INCOME-disability   TRANSPORTATION-drives self   FOOD INSECURITIES/NEEDS-none  FOOD STAMPS-yes   REVIEWED DIET/FLUID/SALT RESTRICTIONS-yes -asks for no salt on foods if going out--does not use salt-limited income diet   RENT/OWN HOME ISSUES-none   SOCIAL SUPPORT-sister, great aunts   SAFETY/DOMESTIC ISSUES-none -  SUBSTANCE ABUSE-none   DAILY WEIGHTS-not daily but some -discussed why   EDUCATE ON DISEASE PROCESS/SYMPTOMS/PURPOSES OF MEDS-yes -  Complaints-chronic pain   Edema-slight  Compliance with meds-yes   Pill box filled-no If so, by whom-does not want to use   Refills needed-doxepin   First visit with pt. He lives here with son.  He has lived here in [place] since 2014-he is from [place].  He is working on a lot of paperwork for guardianship for his adult son who is disabled.  There was concern of what meds he was taking.  He has the entresto and reports taking it and carvedilol and pradaxa BID as prescribed.  We went thru each medicine bottle and he confirmed on how he was taking it and it was correct. He only had not taken the increased dose of carvedilol yet so I told him to take 2 tablets BID until he gets the new bottle from pharmacy.  He seemed to understand that.  He has a system with taking his meds. I reminded him about the lithium values and that the doc needs to monitor the levels more closely.   He reports his breathing is doing ok-when he takes a few steps upstairs he does get sob. He does ok on flat ground but can't walk too far.  He reports sleeping ok, he uses the cpap sometimes. Encouraged him to use cpap more often. He does have the nose piece that he  likes better than the full face mask.   He does not have any questions or anything at this time.   He denies c/p. No dizziness.   He reports he usually eats only once a day.  He has my chart and has access to his appointments times.   He is interested in b/p cuff but he wants to know if he can get the wrist cuff??   BP (!) 134/98   Pulse 80   Resp 18   Wt 216 lb (98 kg)   SpO2 99%   BMI 31.90 kg/m  Weight yesterday-214 Last visit weight-216 @ clinic   Patient Care Team: Pa, [facility] as PCP - General (Internal Medicine) Christell Constant, MD as PCP - Cardiology (Cardiology)  Patient Active Problem List   Diagnosis Date Noted   Nonischemic cardiomyopathy 05/18/2022   Chronic systolic CHF (congestive heart failure) 05/18/2022   Snoring 04/04/2022   Hematoma of left thigh    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   Leukocytosis 12/22/2021   SIRS (systemic inflammatory response syndrome) 12/22/2021   Normocytic anemia 12/22/2021   Hematoma 12/09/2021   Acute deep vein thrombosis (DVT) of right peroneal vein 10/19/2021   History of pulmonary embolism 10/19/2021   Status post lumbar spinal fusion 01/03/2018   Lumbar stenosis 12/23/2017   Spondylolisthesis, lumbar region 06/25/2017   Achilles tendinitis of left lower extremity 12/07/2016   Achilles tendinitis, left leg 05/17/2016  Obstructive sleep apnea on CPAP 03/07/2015   Post-operative state 03/07/2015   Achilles rupture, left 10/21/2014   OSA (obstructive sleep apnea) 04/23/2013   Bipolar disorder, unspecified (HCC) 01/07/2013   Sinus tachycardia 01/07/2013   Headache(784.0) 01/07/2013   SDH (subdural hematoma) 01/04/2013   Pulmonary embolism 01/04/2013   Chronic anticoagulation 01/04/2013   Warfarin-induced coagulopathy 01/04/2013   Acute sinusitis 01/04/2013    Current Outpatient Medications:    carvedilol (COREG) 6.25 MG tablet, Take 1 tablet (6.25 mg total) by mouth 2 (two) times daily., Disp: 60 tablet, Rfl:  11   dabigatran (PRADAXA) 150 MG CAPS capsule, Take 1 capsule (150 mg total) by mouth 2 (two) times daily., Disp: 180 capsule, Rfl: 0   doxepin (SINEQUAN) 75 MG capsule, Take 75 mg by mouth at bedtime., Disp: , Rfl:    empagliflozin (JARDIANCE) 10 MG TABS tablet, Take 1 tablet (10 mg total) by mouth daily before breakfast., Disp: 30 tablet, Rfl: 11   hydrOXYzine (ATARAX) 25 MG tablet, Take 25 mg by mouth 3 (three) times daily as needed for anxiety., Disp: , Rfl:    lithium 600 MG capsule, Take 600 mg by mouth at bedtime., Disp: , Rfl:    mirtazapine (REMERON) 45 MG tablet, Take 45 mg by mouth at bedtime., Disp: , Rfl:    pravastatin (PRAVACHOL) 10 MG tablet, Take 10 mg by mouth daily., Disp: , Rfl:    QUEtiapine (SEROQUEL XR) 400 MG 24 hr tablet, Take 800 mg by mouth at bedtime., Disp: , Rfl:    sacubitril-valsartan (ENTRESTO) 49-51 MG, Take 1 tablet by mouth 2 (two) times daily., Disp: 60 tablet, Rfl: 0   spironolactone (ALDACTONE) 25 MG tablet, Take 1 tablet (25 mg total) by mouth daily., Disp: 30 tablet, Rfl: 11   HYDROcodone-acetaminophen (NORCO) 7.5-325 MG tablet, Take 0.5 tablets by mouth every 6 (six) hours as needed for moderate pain. (Patient not taking: Reported on 07/05/2022), Disp: , Rfl:    predniSONE (DELTASONE) 20 MG tablet, 3 po once a day for 2 days, then 2 po once a day for 3 days, then 1 po once a day for 3 days (Patient not taking: Reported on 07/10/2022), Disp: 15 tablet, Rfl: 0 Allergies  Allergen Reactions   Lyrica [Pregabalin] Other (See Comments)    Hallucinations      Social History   Socioeconomic History   Marital status: Single    Spouse name: Not on file   Number of children: Not on file   Years of education: Not on file   Highest education level: Not on file  Occupational History   Occupation: disability  Tobacco Use   Smoking status: Never   Smokeless tobacco: Never  Vaping Use   Vaping Use: Never used  Substance and Sexual Activity   Alcohol use: No    Drug use: No   Sexual activity: Not on file  Other Topics Concern   Not on file  Social History Narrative   FROM [place]    Lives in a 2 story apartment.  His cousin is currently staying with him.  Has 4 children.  On disability for the past 10 years for pulmonary embolism, bipolar disease.  Education: high school.   Social Determinants of Health   Financial Resource Strain: Not on file  Food Insecurity: No Food Insecurity (07/05/2022)   Hunger Vital Sign    Worried About Running Out of Food in the Last Year: Never true    Ran Out of Food in the Last  Year: Never true  Transportation Needs: No Transportation Needs (07/05/2022)   PRAPARE - Administrator, Civil Service (Medical): No    Lack of Transportation (Non-Medical): No  Physical Activity: Not on file  Stress: Not on file  Social Connections: Not on file  Intimate Partner Violence: Not At Risk (12/22/2021)   Humiliation, Afraid, Rape, and Kick questionnaire    Fear of Current or Ex-Partner: No    Emotionally Abused: No    Physically Abused: No    Sexually Abused: No    Physical Exam      Future Appointments  Date Time Provider Department Center  07/12/2022  1:15 PM Coralyn Helling, MD [facility]  07/20/2022  9:00 AM [facility] PA/NP [facility] None  07/31/2022  9:00 AM Christell Constant, MD [facility]  08/06/2022  2:30 PM [facility] PA/NP [facility] None  10/19/2022  2:00 PM [facility] None  10/19/2022  2:40 PM Jaci Standard, MD [facility] None       Kerry Hough, Paramedic [phone] [facility] Paramedic  07/10/22

## 2022-07-12 ENCOUNTER — Encounter (HOSPITAL_BASED_OUTPATIENT_CLINIC_OR_DEPARTMENT_OTHER): Payer: Self-pay | Admitting: Pulmonary Disease

## 2022-07-12 ENCOUNTER — Ambulatory Visit (INDEPENDENT_AMBULATORY_CARE_PROVIDER_SITE_OTHER): Payer: 59 | Admitting: Pulmonary Disease

## 2022-07-12 VITALS — BP 110/80 | HR 97 | Ht 69.0 in | Wt 211.8 lb

## 2022-07-12 DIAGNOSIS — G4733 Obstructive sleep apnea (adult) (pediatric): Secondary | ICD-10-CM

## 2022-07-12 NOTE — Patient Instructions (Signed)
Follow up in 1 year.

## 2022-07-12 NOTE — Progress Notes (Signed)
[facility], [facility] Sleep Medicine  Chief Complaint  Patient presents with   Follow-up    Wore cpap machine a week ago    Constitutional:  BP 110/80   Pulse 97   Ht  (1.753 m)   Wt 211 lb 12.8 oz (96.1 kg)   SpO2 97%   BMI 31.28 kg/m   Past Medical History:  PE, Bipolar, SDH s/p craniotomy 2014, PNA  Past Surgical History:  He  has a past surgical history that includes Appendectomy; Cystectomy; Craniotomy (N/A, 01/19/2013); Elbow surgery; Fracture surgery; Achilles tendon surgery (Left, 10/21/2014); Multiple extractions with alveoloplasty (N/A, 03/07/2015); I & D extremity (Left, 12/07/2016); Lumbar fusion (12/23/2017); Spinal cord stimulator insertion (N/A, 05/18/2019); Cardiac catheterization (03/04/2018); Spinal cord stimulator insertion (N/A, 04/06/2020); RIGHT/LEFT HEART CATH AND CORONARY ANGIOGRAPHY (N/A, 05/18/2022); and TEE without cardioversion (N/A, 05/18/2022).  Brief Summary:  Justin [name] is a 57 y.o. male with obstructive sleep apnea.      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  Home sleep study showed mild sleep apnea.  Started on CPAP again.  He feels this helps.  No issue with mask or pressure.  Only problem is that he sometimes falls asleep after taking his evening medications prior to putting CPAP on.  Physical Exam:   Appearance - well kempt   ENMT - no sinus tenderness, no oral exudate, no LAN, Mallampati 3 airway, no stridor, wears dentures  Respiratory - equal breath sounds bilaterally, no wheezing or rales  CV - s1s2 regular rate and rhythm, no murmurs  Ext - no clubbing, no edema  Skin - no rashes  Psych - normal mood and affect    Sleep Tests:  PSG 05/21/13 >> AHI 23 Auto CPAP 05/13/15 to 06/11/15 >> used on 16 of 30 nights with average 8 hrs 40 min.  Average AHI 3.5 with median CPAP 11 and 95 th percentile CPAP 16 cm H2O. HST 01/08/21 >> AHI 13.3, SpO2 low 77%.   Cardiac Tests:  Echo  03/26/16 >> mild LVH, EF 40%, grade 1 DD  Social History:  He  reports that he has never smoked. He has never used smokeless tobacco. He reports that he does not drink alcohol and does not use drugs.  Family History:  His family history includes Down syndrome in his son; Hypertension in his mother; Multiple sclerosis in his sister; Stroke in his mother.     Assessment/Plan:   Obstructive sleep apnea. - he is compliant with CPAP and reports benefit from therapy - he uses [employer] for his DME - continue auto CPAP 5 to 15 cm H2O  Time Spent Involved in Patient Care on Day of Examination:  22 minutes  Follow up:   Patient Instructions  Follow up in 1 year  Medication List:   Allergies as of 07/12/2022       Reactions   Lyrica [pregabalin] Other (See Comments)   Hallucinations        Medication List        Accurate as of July 12, 2022  1:49 PM. If you have any questions, ask your nurse or doctor.          carvedilol 6.25 MG tablet Commonly known as: Coreg Take 1 tablet (6.25 mg total) by mouth 2 (two) times daily.   dabigatran 150 MG Caps capsule Commonly known as: Pradaxa Take 1 capsule (150 mg total) by mouth 2 (two) times daily.  doxepin 75 MG capsule Commonly known as: SINEQUAN Take 75 mg by mouth at bedtime.   empagliflozin 10 MG Tabs tablet Commonly known as: Jardiance Take 1 tablet (10 mg total) by mouth daily before breakfast.   Entresto 49-51 MG Generic drug: sacubitril-valsartan Take 1 tablet by mouth 2 (two) times daily.   HYDROcodone-acetaminophen 7.5-325 MG tablet Commonly known as: NORCO Take 0.5 tablets by mouth every 6 (six) hours as needed for moderate pain.   hydrOXYzine 25 MG tablet Commonly known as: ATARAX Take 25 mg by mouth 3 (three) times daily as needed for anxiety.   lithium 600 MG capsule Take 600 mg by mouth at bedtime.   mirtazapine 45 MG tablet Commonly known as: REMERON Take 45 mg by mouth at bedtime.    pravastatin 10 MG tablet Commonly known as: PRAVACHOL Take 10 mg by mouth daily.   predniSONE 20 MG tablet Commonly known as: DELTASONE 3 po once a day for 2 days, then 2 po once a day for 3 days, then 1 po once a day for 3 days   QUEtiapine 400 MG 24 hr tablet Commonly known as: SEROQUEL XR Take 800 mg by mouth at bedtime.   spironolactone 25 MG tablet Commonly known as: Aldactone Take 1 tablet (25 mg total) by mouth daily.        Signature:  Coralyn Helling, MD [facility] Pulmonary/Critical Care Pager - [phone] 07/12/2022, 1:49 PM

## 2022-07-16 ENCOUNTER — Emergency Department (HOSPITAL_COMMUNITY): Payer: 59

## 2022-07-16 ENCOUNTER — Observation Stay (HOSPITAL_COMMUNITY)
Admission: EM | Admit: 2022-07-16 | Discharge: 2022-07-18 | Disposition: A | Discharge status: Home / Self Care | Payer: 59 | Attending: Internal Medicine | Admitting: Internal Medicine

## 2022-07-16 DIAGNOSIS — Z86718 Personal history of other venous thrombosis and embolism: Secondary | ICD-10-CM | POA: Diagnosis not present

## 2022-07-16 DIAGNOSIS — R292 Abnormal reflex: Secondary | ICD-10-CM

## 2022-07-16 DIAGNOSIS — Z86711 Personal history of pulmonary embolism: Secondary | ICD-10-CM | POA: Insufficient documentation

## 2022-07-16 DIAGNOSIS — R519 Headache, unspecified: Secondary | ICD-10-CM | POA: Diagnosis not present

## 2022-07-16 DIAGNOSIS — R251 Tremor, unspecified: Secondary | ICD-10-CM | POA: Diagnosis not present

## 2022-07-16 DIAGNOSIS — I5020 Unspecified systolic (congestive) heart failure: Secondary | ICD-10-CM | POA: Diagnosis present

## 2022-07-16 DIAGNOSIS — I10 Essential (primary) hypertension: Secondary | ICD-10-CM | POA: Diagnosis not present

## 2022-07-16 DIAGNOSIS — R000 Tachycardia, unspecified: Secondary | ICD-10-CM | POA: Diagnosis not present

## 2022-07-16 DIAGNOSIS — Z79899 Other long term (current) drug therapy: Secondary | ICD-10-CM | POA: Insufficient documentation

## 2022-07-16 DIAGNOSIS — I5022 Chronic systolic (congestive) heart failure: Secondary | ICD-10-CM | POA: Insufficient documentation

## 2022-07-16 DIAGNOSIS — G252 Other specified forms of tremor: Secondary | ICD-10-CM

## 2022-07-16 DIAGNOSIS — R29818 Other symptoms and signs involving the nervous system: Secondary | ICD-10-CM | POA: Diagnosis not present

## 2022-07-16 DIAGNOSIS — F319 Bipolar disorder, unspecified: Secondary | ICD-10-CM | POA: Diagnosis present

## 2022-07-16 DIAGNOSIS — G4733 Obstructive sleep apnea (adult) (pediatric): Secondary | ICD-10-CM

## 2022-07-16 DIAGNOSIS — J811 Chronic pulmonary edema: Secondary | ICD-10-CM | POA: Diagnosis not present

## 2022-07-16 DIAGNOSIS — Z7901 Long term (current) use of anticoagulants: Secondary | ICD-10-CM | POA: Diagnosis not present

## 2022-07-16 DIAGNOSIS — R202 Paresthesia of skin: Secondary | ICD-10-CM | POA: Diagnosis not present

## 2022-07-16 LAB — COMPREHENSIVE METABOLIC PANEL
ALT: 18 U/L (ref 0–44)
AST: 20 U/L (ref 15–41)
Albumin: 4 g/dL (ref 3.5–5.0)
Alkaline Phosphatase: 81 U/L (ref 38–126)
Anion gap: 11 (ref 5–15)
BUN: 7 mg/dL (ref 6–20)
CO2: 23 mmol/L (ref 22–32)
Calcium: 8.8 mg/dL — ABNORMAL LOW (ref 8.9–10.3)
Chloride: 106 mmol/L (ref 98–111)
Creatinine, Ser: 1.43 mg/dL — ABNORMAL HIGH (ref 0.61–1.24)
GFR, Estimated: 57 mL/min — ABNORMAL LOW (ref >60)
Glucose, Bld: 156 mg/dL — ABNORMAL HIGH (ref 70–99)
Potassium: 3.6 mmol/L (ref 3.5–5.1)
Sodium: 140 mmol/L (ref 135–145)
Total Bilirubin: 0.6 mg/dL (ref 0.3–1.2)
Total Protein: 7 g/dL (ref 6.5–8.1)

## 2022-07-16 LAB — CBC WITH DIFFERENTIAL/PLATELET
Abs Immature Granulocytes: 0.06 10*3/uL (ref 0.00–0.07)
Basophils Absolute: 0.1 10*3/uL (ref 0.0–0.1)
Basophils Relative: 1 %
Eosinophils Absolute: 0.1 10*3/uL (ref 0.0–0.5)
Eosinophils Relative: 1 %
HCT: 38.7 % — ABNORMAL LOW (ref 39.0–52.0)
Hemoglobin: 13 g/dL (ref 13.0–17.0)
Immature Granulocytes: 0 %
Lymphocytes Relative: 10 %
Lymphs Abs: 1.5 10*3/uL (ref 0.7–4.0)
MCH: 28.2 pg (ref 26.0–34.0)
MCHC: 33.6 g/dL (ref 30.0–36.0)
MCV: 83.9 fL (ref 80.0–100.0)
Monocytes Absolute: 0.6 10*3/uL (ref 0.1–1.0)
Monocytes Relative: 4 %
Neutro Abs: 12.4 10*3/uL — ABNORMAL HIGH (ref 1.7–7.7)
Neutrophils Relative %: 84 %
Platelets: 246 10*3/uL (ref 150–400)
RBC: 4.61 MIL/uL (ref 4.22–5.81)
RDW: 14.8 % (ref 11.5–15.5)
WBC: 14.7 10*3/uL — ABNORMAL HIGH (ref 4.0–10.5)
nRBC: 0 % (ref 0.0–0.2)

## 2022-07-16 LAB — RAPID URINE DRUG SCREEN, HOSP PERFORMED
Amphetamines: NOT DETECTED
Barbiturates: NOT DETECTED
Benzodiazepines: NOT DETECTED
Cocaine: NOT DETECTED
Opiates: NOT DETECTED
Tetrahydrocannabinol: NOT DETECTED

## 2022-07-16 LAB — SALICYLATE LEVEL: Salicylate Lvl: <7 mg/dL — ABNORMAL LOW (ref 7.0–30.0)

## 2022-07-16 LAB — URINALYSIS, ROUTINE W REFLEX MICROSCOPIC
Bilirubin Urine: NEGATIVE
Glucose, UA: NEGATIVE mg/dL
Hgb urine dipstick: NEGATIVE
Ketones, ur: NEGATIVE mg/dL
Leukocytes,Ua: NEGATIVE
Nitrite: NEGATIVE
Protein, ur: NEGATIVE mg/dL
Specific Gravity, Urine: 1.006 (ref 1.005–1.030)
pH: 5 (ref 5.0–8.0)

## 2022-07-16 LAB — ACETAMINOPHEN LEVEL: Acetaminophen (Tylenol), Serum: <10 ug/mL — ABNORMAL LOW (ref 10–30)

## 2022-07-16 LAB — LITHIUM LEVEL: Lithium Lvl: 0.37 mmol/L — ABNORMAL LOW (ref 0.60–1.20)

## 2022-07-16 LAB — ETHANOL: Alcohol, Ethyl (B): <10 mg/dL (ref <10)

## 2022-07-16 MED ORDER — LACTATED RINGERS IV BOLUS
1000.0000 mL | Freq: Once | INTRAVENOUS | Status: AC
  Administered 2022-07-16: 1000 mL via INTRAVENOUS

## 2022-07-16 MED ORDER — ONDANSETRON HCL 4 MG/2ML IJ SOLN
4.0000 mg | Freq: Four times a day (QID) | INTRAMUSCULAR | Status: DC
  Administered 2022-07-16 – 2022-07-18 (×6): 4 mg via INTRAVENOUS
  Filled 2022-07-16 (×6): qty 2

## 2022-07-16 NOTE — Consult Note (Signed)
Neurology Consultation Reason for Consult: New onset termor Requesting Physician: Curley Spice  CC: Acute onset tremors  History is obtained from: Patient and chart review  HPI: Justin [name] is a 57 y.o. 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  When he got up around 4:30 PM to get something to eat he noticed he had a significant tremor in his hands that made it impossible for him to make himself a sandwich.  Subsequently he found the tremor was getting worse and starting to affect his lower extremities as well but he was still ambulatory.  He came to the ED for further evaluation  Denies any headache, vision changes including diplopia, neck pain (though on further questioning reports some radiating pain into his bilateral hands with movement), bowel or bladder symptoms, numbness (though on examination has some mild numbness in the left V1 and V2 distribution on the face as well as in the bilateral hands and right leg), recent travel, and any recent infections, fevers/chills, nausea/vomiting (did have an episode here which he reports was after he was given Zofran).  Denies any prior episodes like this.  Denies any changes in his medication recently, although on review of the chart there is some evidence of lack of similarity with his heart failure regimen.  He does endorse adherence to his anticoagulant as well as his bipolar medications to me  Additionally per chart review  on 4/5 he was provided with a prescription for prednisone and Robaxin for possible nerve impingement leading to  right lower back pain radiating to the right leg, and at that time he was reporting similar intermittent symptoms to the right upper extremity with tingling of the right small finger.  Notably his blood pressure was normal on follow-up with sleep medicine several days prior to admission.  He reports he is not taking any blood pressure medications, and he thinks he does get some whitecoat hypertension when being in the hospital, he is significantly anxious about his current disability  "57 y.o. with history of PE/DVT, bipolar disorder, and chronic systolic CHF was referred by Dr. Katrinka Blazing for evaluation of CHF. Patient has been on anticoagulation long-term for his h/o PE/DVT. He does not miss doses. Suspected clotting disorder, being worked up by hematology for possible antiphospholipid antibody syndrome. He had initial PE in 2009, Eliquis started. He had DVT on left while on Eliquis and switched to coumadin. In 11/2021, he had a spontaneous left thigh hematoma while INR supratherapeutic. Warfarin was held and eventually switched to Pradaxa   He is certain of his medication adherence for his bipolar medications, however the only other medication he was able to confirm that he is taking twice daily as prescribed is Entresto. He does not believe he is taking carvedilol or Pradaxa in the evening. He only took his Entresto this morning. His last lithium dose was on 07/04/22 at 9:30-10pm. "   ROS: All other review of systems was negative except as noted in the HPI.   Past Medical History:  Diagnosis Date   Achilles rupture, left    Bipolar 1 disorder (HCC)    Dental caries  periodontitis   Pneumonia    Pulmonary embolism (HCC) 05/18/2007   Sleep apnea    wears CPAP   Subdural hematoma (HCC) 01/04/2013   in setting of supratherapeutic INR   Past Surgical History:  Procedure Laterality Date   ACHILLES TENDON SURGERY Left 10/21/2014   Procedure: Left Achilles Reconstruction;  Surgeon: Nadara Mustard, MD;   Location: [facility] OR;  Service: Orthopedics;  Laterality: Left;   APPENDECTOMY     CARDIAC CATHETERIZATION  03/04/2018   [facility]: Normal coronaries, LVEF estimated at 40%, medical Rx   CRANIOTOMY N/A 01/19/2013   Procedure: CRANIOTOMY HEMATOMA EVACUATION SUBDURAL;  Surgeon: Hewitt Shorts, MD;  Location: [facility] NEURO ORS;  Service: Neurosurgery;  Laterality: N/A;   CYSTECTOMY     right head   ELBOW SURGERY     right   FRACTURE SURGERY     finger   I & D EXTREMITY Left 12/07/2016   Procedure: LEFT ACHILLES DEBRIDEMENT;  Surgeon: Nadara Mustard, MD;  Location: [facility] OR;  Service: Orthopedics;  Laterality: Left;   LUMBAR FUSION  12/23/2017   L5 GILL PROCEDURE, RIGHT L5-S1, TRANSFORAMIAL LUMBAR INTERBODY FUSION, BILATERAL LATERAL FUSION, PEDICLE INSTRUMENTATION   MULTIPLE EXTRACTIONS WITH ALVEOLOPLASTY N/A 03/07/2015   Procedure: MULTIPLE EXTRACTION WITH ALVEOLOPLASTY;  Surgeon: [name], DDS;  Location: [facility] OR;  Service: Oral Surgery;  Laterality: N/A;   RIGHT/LEFT HEART CATH AND CORONARY ANGIOGRAPHY N/A 05/18/2022   Procedure: RIGHT/LEFT HEART CATH AND CORONARY ANGIOGRAPHY;  Surgeon: Laurey Morale, MD;  Location: [facility]ASIVE CV LAB;  Service: Cardiovascular;  Laterality: N/A;   SPINAL CORD STIMULATOR INSERTION N/A 05/18/2019   Procedure: LUMBAR SPINAL CORD STIMULATOR INSERTION;  Surgeon: Odette Fraction, MD;  Location: [facility] OR;  Service: Neurosurgery;  Laterality: N/A;  Thoracic/Lumbar   SPINAL CORD STIMULATOR INSERTION N/A 04/06/2020   Procedure: Revision of spinal cord stimulator;  Surgeon: Renaldo Fiddler, MD;  Location: [facility] OR;  Service: Neurosurgery;  Laterality: N/A;   TEE WITHOUT CARDIOVERSION N/A 05/18/2022   Procedure: TRANSESOPHAGEAL ECHOCARDIOGRAM (TEE);  Surgeon: Laurey Morale, MD;  Location: [facility] ENDOSCOPY;  Service: Cardiovascular;  Laterality: N/A;   Current Outpatient Medications  Medication Instructions   carvedilol (COREG) 6.25 mg, Oral, 2 times daily   dabigatran (PRADAXA) 150  mg, Oral, 2 times daily   doxepin (SINEQUAN) 75 mg, Oral, Daily at bedtime   empagliflozin (JARDIANCE) 10 mg, Oral, Daily before breakfast   HYDROcodone-acetaminophen (NORCO) 7.5-325 MG tablet 0.5 tablets, Oral, Every 6 hours PRN   hydrOXYzine (ATARAX) 25 mg, Oral, 3 times daily PRN   lithium 600 mg, Oral, Daily at bedtime   mirtazapine (REMERON) 45 mg, Oral, Daily at bedtime   pravastatin (PRAVACHOL) 10 mg, Oral, Daily   predniSONE (DELTASONE) 20 MG tablet 3 po once a day for 2 days, then 2 po once a day for 3 days, then 1 po once a day for 3 days   QUEtiapine (SEROQUEL XR) 800 mg, Oral, Daily at bedtime   sacubitril-valsartan (ENTRESTO) 49-51 MG 1 tablet, Oral, 2 times daily   spironolactone (ALDACTONE) 25 mg, Oral, Daily     Family History  Problem Relation Age of Onset   Hypertension Mother    Stroke Mother    Multiple sclerosis Sister    Down syndrome Son     Social History:  reports that he has never smoked. He has never used smokeless tobacco. He reports that he does not drink alcohol and does not use drugs.   Exam: Current vital  signs: BP (!) 164/82   Pulse (!) 114   Temp 97.6 F (36.4 C) (Oral)   Resp (!) 35   Ht 5\' 10"  (1.778 m)   Wt 97.5 kg   SpO2 99%   BMI 30.85 kg/m  Vital signs in last 24 hours: Temp:  [97.6 F (36.4 C)] 97.6 F (36.4 C) (04/29 1807) Pulse Rate:  [102-126] 114 (04/29 2300) Resp:  [13-35] 35 (04/29 2300) BP: (130-175)/(76-112) 164/82 (04/29 2300) SpO2:  [98 %-100 %] 99 % (04/29 2300) Weight:  [97.5 kg] 97.5 kg (04/29 1822)   Physical Exam  Constitutional: Appears well-developed and well-nourished.  Psych: Affect mildly anxious but cooperative and pleasant Eyes: No scleral injection HENT: No oropharyngeal obstruction.  MSK: no joint deformities.  Cardiovascular: Tachycardic, regular rhythm Respiratory: Effort normal, non-labored breathing GI: Soft.  No distension. There is no tenderness.  Skin: Warm dry and intact visible  skin  Neuro: Mental Status: Patient is awake, alert, oriented to person, place, month, year, and situation. Patient is able to give a clear and coherent history. No signs of aphasia or neglect Cranial Nerves: II: Visual Fields are full. Pupils are equal, round, and reactive to light.   III,IV, VI: EOMI without ptosis or diploplia.  At times slightly saccadic pursuits V: Facial sensation is reduced to light touch but not temperature in V2 and V3 on the left VII: Facial movement is symmetric.  VIII: hearing is intact to voice, reports it is slightly reduced on the right to tuning fork X: Uvula elevates symmetrically XI: Shoulder shrug is symmetric. XII: tongue is midline but notably has significant tremoring Motor: Tone is spastic. Bulk is normal. 5/5 strength was present in all four extremities expect mild hand weakness 4+/5  Sensory: Right leg reduced sensation to temperature and light touch. Bilateral hand numbness. Some shooting pain in both hands with movement Deep Tendon Reflexes: 3+ to 4+ throughout. Positive hoffman's. Ankle clonus.  Does appear to have a positive jaw jerk but difficult to be confident due to his tremoring of the face and jaw Cerebellar: FNF and HKS are notable for bilateral tremor. No truncal ataxia Gait:  Able to stand, unable to rise on toes.  I have reviewed labs in epic and the results pertinent to this consultation are:   Latest Reference Range & Units 04/04/14 19:43 11/04/21 02:14 12/22/21 01:57 07/05/22 10:00 07/16/22 22:54  Lithium 0.60 - 1.20 mmol/L 0.27 (L) 0.44 (L) 0.27 (L) 0.75 0.37 (L)  (L): Data is abnormally low  I have reviewed the images obtained:  Head CT personally reviewed, agree with radiology:   no acute intracranial process    Impression: Patient presents with sudden onset tremoring of the arms greater than legs which on examination is more consistent with severe spasticity leading to clonus with movement.  Additionally reports  this numbness in the bilateral hands which may also be slightly weak versus giveaway, as well as numbness in the right leg, which he reports is new but which may be related to his chronic lumbar spine issues.  Sensory deficits reported in his face are also not physiologically consistent.  However the spasticity is reproducible and concerning for possible brainstem/high cervical spine localization.  Unfortunately workup is limited due to his spinal cord stimulators which preclude MRI per his report, he does not have the card or remote for his stimulators with him to confirm at this time.  His lithium is not at a toxic level and while lithium can result in tremor the  acute onset and level near his baseline levels is reassuring against significant lithium toxicity.  However lithium and Seroquel can be associated with serotonergic syndrome.  At this time he does have tachycardia and hyperreflexia but no hyperthermia and does not appear confused or diaphoretic, will continue to monitor closely  Recommendations: - Hold all serotonergic agents for now including lithium, seroquel  - CTA head and neck / CT C-spine  - Admission for further workup, close observation - Neurology will continue to follow  Brooke Dare MD-PhD [facility] [phone] Available 7 AM to 7 PM, outside these hours please contact Neurologist on call listed on AMION

## 2022-07-16 NOTE — ED Provider Notes (Signed)
[place] EMERGENCY DEPARTMENT AT [facility] Provider Note  Medical Decision Making   HPI: Justin [name] is a 57 y.o. male with history perinent for part subdural hemorrhage, prior pulmonary embolism, bipolar disorder on lithium, Seroquel, Remeron who presents complaining of tremors. Patient arrived via EMS accompanied by adult son with Down syndrome.  History provided by patient.  No interpreter required for this encounter.  Patient reports that symptoms began acutely approximately 4:30 PM.  Reports that he was standing up and he developed tremors of his bilateral upper extremities, worse with activity, then feels that tremors spread to his bilateral lower extremities.  He states that he tried to relax, however the tremors persisted, therefore he called EMS to present to the emergency department, he denies any chest pain, shortness of breath, chills, fever, new medications, missed or extra doses of lithium or other psychiatric medications.  Denies use of alcohol or recreational substances in years.  ROS: As per HPI. Please see MAR for complete past medical history, surgical history, and social history.   Physical exam is pertinent for tachycardia with regular rhythm, intention tremor of all 4 extremities.   The differential includes but is not limited to ICH, intoxication, lithium toxicity, hypoglycemia, electrolyte derangement, AKI, conversion reaction, intention tremor.  Additional history obtained from: None External records from outside source obtained and reviewed including: None  ED provider interpretation of ECG: Rate 115, sinus rhythm, nonspecific T wave inversions in leads V1, aVL  ED provider interpretation of radiology/imaging: Patient reviewed chest x-ray, I do not appreciate focal airspace opacification, cardiomediastinal silhouette derangement, pleural effusion, pneumothorax, bony displacement.  CT head without ICH, loss of gray/white matter differentiation or  displaced fracture  Labs ordered were interpreted by myself as well as my attending and were incorporated into the medical decision making process for this patient.  ED provider interpretation of labs: Lithium below therapeutic level.  Salicylate, acetaminophen, ethanol levels negative.  UDS negative.  UA without UTI, CMP without emergent electrolyte derangement, creatinine elevated to 1.4, this appears to be patient's baseline.  CBC with mild nonspecific leukocytosis to 14.7, left shift, no anemia or thrombocytopenia.  TSH pending.  Interventions: LR, Zofran  See the EMR for full details regarding lab and imaging results.  On exam, patient has significant and disabling intention tremor, and tachycardia with regular rate, seems anxious, saturating well on room air, otherwise vitally stable.  Patient reports that the medications he takes are lithium, Seroquel, Remeron, appears that patient is likely not adherent to the remainder of his prescribed medications.  Patient is fang D-, doubt stroke, particularly given symptoms are only present with intention, no present in all 4 extremities.  CT head indicated given patient has history of ICH, no ICH currently.  Patient does not appear clinically intoxicated, reports that it has been years since he has taken any recreational substances, ethanol level and UDS reflect abstinence, though symptoms would be concerning for alcohol withdrawal or benzo withdrawal, patient denies this possibility.  Patient not hypoglycemic, no evidence of AKI or significant electrolyte derangement that could reflect patient's etiology of symptoms.  Also considered lithium toxicity, however patient with out any AKI which would be expected in lithium toxicity, additionally lithium level ultimately found to be below therapeutic levels mom with further questioning patient reports that he only takes this once a day rather than twice a day.  Overall given above unrevealing workup, consulted  neurology for further recommendations, neurology will evaluate the patient, and ordered a CTA  head and neck to further evaluate patient's vasculature.  Additionally despite liter fluid bolus patient has not had significant change in tachycardia, plan at the time of signout, follow-up CTA, follow-up neurology recommendations, reevaluate, disposition pending these interventions.  Consults: Neurology  Disposition: HANDOFF: At the time of signout, the patients CTA and neuro recommendations had not yet been completed. I transferred care of the patient at the time of signout to [place], [place]. I informed the incoming care provider of the patient's history, status, and management plan. I addressed all of their concerns and/or questions to the best of my ability. Please refer to the incoming care provider's note for details regarding the remainder of the patient's ED course and disposition.  The plan for this patient was discussed with Dr. Hyacinth Meeker, who voiced agreement and who oversaw evaluation and treatment of this patient.  Clinical Impression:  1. Intention tremor    Data Unavailable  Therapies: These medications and interventions were provided for the patient while in the ED. Medications  ondansetron (ZOFRAN) injection 4 mg (4 mg Intravenous Given 07/16/22 2149)  lactated ringers bolus 1,000 mL (0 mLs Intravenous Stopped 07/16/22 2113)    MDM generated using voice dictation software and may contain dictation errors.  Please contact me for any clarification or with any questions.  Clinical Complexity A medically appropriate history, review of systems, and physical exam was performed.  Collateral history obtained from: None I personally reviewed the labs, EKG, imaging as discussed above. Patient's presentation is most consistent with acute complicated illness / injury requiring diagnostic workup Treatment: Pending at the time of signout Medications: Prescription Discussed patient's care with  providers from the following different specialties: Neurology  Physical Exam   ED Triage Vitals  Enc Vitals Group     BP 07/16/22 1807 (!) 175/103     Pulse Rate 07/16/22 1807 (!) 104     Resp 07/16/22 1807 (!) 22     Temp 07/16/22 1807 97.6 F (36.4 C)     Temp Source 07/16/22 1807 Oral     SpO2 07/16/22 1807 100 %     Weight 07/16/22 1822 215 lb (97.5 kg)     Height 07/16/22 1822 5\' 10"  (1.778 m)     Head Circumference --      Peak Flow --      Pain Score 07/16/22 1824 0     Pain Loc --      Pain Edu? --      Excl. in GC? --      Physical Exam Vitals and nursing note reviewed.  Constitutional:      General: He is not in acute distress.    Appearance: He is well-developed.  HENT:     Head: Normocephalic and atraumatic.  Eyes:     Conjunctiva/sclera: Conjunctivae normal.  Cardiovascular:     Rate and Rhythm: Normal rate and regular rhythm.     Heart sounds: No murmur heard. Pulmonary:     Effort: Pulmonary effort is normal. No respiratory distress.     Breath sounds: Normal breath sounds.  Abdominal:     Palpations: Abdomen is soft.     Tenderness: There is no abdominal tenderness.  Musculoskeletal:        General: No swelling.     Cervical back: Neck supple.  Skin:    General: Skin is warm and dry.     Capillary Refill: Capillary refill takes less than 2 seconds.  Neurological:     Mental Status: He is alert  and oriented to person, place, and time.     GCS: GCS eye subscore is 4. GCS verbal subscore is 5. GCS motor subscore is 6.     Cranial Nerves: No cranial nerve deficit or facial asymmetry.     Sensory: Sensation is intact.     Motor: Tremor (Intention tremor) present.     Coordination: Finger-Nose-Finger Test abnormal and Heel to [employer] abnormal.  Psychiatric:        Mood and Affect: Mood normal.       Procedure Note  Procedures  CT Head Wo Contrast  Final Result    DG Chest Portable 1 View  Final Result    CT C-SPINE NO CHARGE    (Results  Pending)  CT ANGIO HEAD NECK W WO CM (CODE STROKE)    (Results Pending)    L. Erlene Quan, MD Emergency Medicine, PGY-2   Curley Spice, MD 07/17/22 Lance Coon    Eber Hong, MD 07/17/22 [phone]

## 2022-07-16 NOTE — ED Triage Notes (Addendum)
Pt BIB EMS from home for new onset of tremors of upper extremities, pt now reporting tremors have spread to bilateral legs. Started at 1630. Pt takes Lithium, Seroquel, Remeron.    EMS VS 184/100 108 18 98% RA

## 2022-07-16 NOTE — Consult Note (Incomplete)
Neurology Consultation Reason for Consult: New onset termor Requesting Physician: Curley Spice  CC: Acute onset tremors  History is obtained from: Patient and chart review  HPI: Justin [name] is a 57 y.o. 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  When he got up around 4:30 PM to get something to eat he noticed he had a significant tremor in his hands that made it impossible for him to make himself a sandwich.  Subsequently he found the tremor was getting worse and starting to affect his lower extremities as well but he was still ambulatory.  He came to the ED for further evaluation  Denies any headache, vision changes including diplopia, neck pain (though on further questioning reports some radiating pain into his bilateral hands with movement), bowel or bladder symptoms, numbness (though on examination has some mild numbness in the left V1 and V2 distribution on the face as well as in the bilateral hands and right leg), recent travel, and any recent infections, fevers/chills, nausea/vomiting (did have an episode here which he reports was after he was given Zofran).  Denies any prior episodes like this.  Denies any changes in his medication recently  LKW: 3:30 PM Thrombolytic given?: No, out of the window IA performed?: No, *** Premorbid modified rankin scale:      0 - No symptoms.    ROS: All other review of systems was negative except as noted in the HPI. *** Unable to obtain due to altered mental status.   Past Medical History:  Diagnosis Date  . Achilles rupture, left   .  Bipolar 1 disorder (HCC)   . Dental caries    periodontitis  . Pneumonia   . Pulmonary embolism (HCC) 05/18/2007  . Sleep apnea    wears CPAP  . Subdural hematoma (HCC) 01/04/2013   in setting of supratherapeutic INR   Past Surgical History:  Procedure Laterality Date  . ACHILLES TENDON SURGERY Left 10/21/2014   Procedure: Left Achilles Reconstruction;  Surgeon: Nadara Mustard, MD;  Location: [facility] OR;  Service: Orthopedics;  Laterality: Left;  . APPENDECTOMY    . CARDIAC CATHETERIZATION  03/04/2018   [facility]: Normal coronaries, LVEF estimated at 40%, medical Rx  . CRANIOTOMY N/A 01/19/2013   Procedure: CRANIOTOMY HEMATOMA EVACUATION SUBDURAL;  Surgeon: Hewitt Shorts, MD;  Location: [facility] NEURO ORS;  Service: Neurosurgery;  Laterality: N/A;  . CYSTECTOMY     right head  . ELBOW SURGERY     right  . FRACTURE SURGERY     finger  . I & D EXTREMITY Left 12/07/2016   Procedure: LEFT ACHILLES DEBRIDEMENT;  Surgeon: Nadara Mustard, MD;  Location: [facility] OR;  Service: Orthopedics;  Laterality: Left;  . LUMBAR FUSION  12/23/2017   L5 GILL PROCEDURE, RIGHT L5-S1, TRANSFORAMIAL LUMBAR INTERBODY FUSION, BILATERAL LATERAL FUSION, PEDICLE INSTRUMENTATION  . MULTIPLE EXTRACTIONS WITH ALVEOLOPLASTY N/A 03/07/2015   Procedure: MULTIPLE EXTRACTION WITH ALVEOLOPLASTY;  Surgeon: [name], DDS;  Location: [facility] OR;  Service: Oral Surgery;  Laterality: N/A;  . RIGHT/LEFT HEART CATH AND CORONARY ANGIOGRAPHY N/A 05/18/2022   Procedure: RIGHT/LEFT HEART CATH AND CORONARY ANGIOGRAPHY;  Surgeon:  Laurey Morale, MD;  Location: [facility] INVASIVE CV LAB;  Service: Cardiovascular;  Laterality: N/A;  . SPINAL CORD STIMULATOR INSERTION N/A 05/18/2019   Procedure: LUMBAR SPINAL CORD STIMULATOR INSERTION;  Surgeon: Odette Fraction, MD;  Location: [facility] OR;  Service: Neurosurgery;  Laterality: N/A;  Thoracic/Lumbar  . SPINAL CORD STIMULATOR INSERTION N/A 04/06/2020   Procedure: Revision of spinal cord stimulator;  Surgeon: Renaldo Fiddler, MD;  Location: [facility] OR;  Service: Neurosurgery;  Laterality: N/A;  . TEE WITHOUT CARDIOVERSION N/A 05/18/2022   Procedure: TRANSESOPHAGEAL ECHOCARDIOGRAM (TEE);  Surgeon: Laurey Morale, MD;  Location: [facility] ENDOSCOPY;  Service: Cardiovascular;  Laterality: N/A;   Current Outpatient Medications  Medication Instructions  . carvedilol (COREG) 6.25 mg, Oral, 2 times daily  . dabigatran (PRADAXA) 150 mg, Oral, 2 times daily  . doxepin (SINEQUAN) 75 mg, Oral, Daily at bedtime  . empagliflozin (JARDIANCE) 10 mg, Oral, Daily before breakfast  . HYDROcodone-acetaminophen (NORCO) 7.5-325 MG tablet 0.5 tablets, Oral, Every 6 hours PRN  . hydrOXYzine (ATARAX) 25 mg, Oral, 3 times daily PRN  . lithium 600 mg, Oral, Daily at bedtime  . mirtazapine (REMERON) 45 mg, Oral, Daily at bedtime  . pravastatin (PRAVACHOL) 10 mg, Oral, Daily  . predniSONE (DELTASONE) 20 MG tablet 3 po once a day for 2 days, then 2 po once a day for 3 days, then 1 po once a day for 3 days  . QUEtiapine (SEROQUEL XR) 800 mg, Oral, Daily at bedtime  . sacubitril-valsartan (ENTRESTO) 49-51 MG 1 tablet, Oral, 2 times daily  . spironolactone (ALDACTONE) 25 mg, Oral, Daily     Family History  Problem Relation Age of Onset  . Hypertension Mother   . Stroke Mother   . Multiple sclerosis Sister   . Down syndrome Son    ***  Social History:  reports that he has never smoked. He has never used smokeless tobacco. He reports that he does not drink alcohol and does not use drugs. ***  Exam: Current vital signs: BP (!) 165/92   Pulse (!) 110   Temp 97.6 F (36.4 C) (Oral)   Resp (!) 21   Ht 5\' 10"  (1.778 m)   Wt 97.5 kg   SpO2 100%   BMI 30.85 kg/m  Vital signs in last 24 hours: Temp:  [97.6 F (36.4 C)] 97.6 F (36.4 C) (04/29 1807) Pulse Rate:  [102-126] 110 (04/29 2230) Resp:  [13-26] 21 (04/29 2230) BP: (130-175)/(76-112) 165/92 (04/29 2230) SpO2:  [98 %-100 %] 100 % (04/29 2230) Weight:  [97.5 kg] 97.5 kg  (04/29 1822)   Physical Exam  Constitutional: Appears well-developed and well-nourished.  Psych: Affect appropriate to situation, *** Eyes: No scleral injection HENT: No oropharyngeal obstruction.  MSK: no joint deformities.  Cardiovascular: Normal rate and regular rhythm. *** Perfusing extremities well Respiratory: Effort normal, non-labored breathing GI: Soft.  No distension. There is no tenderness.  Skin: Warm dry and intact visible skin  Neuro: Mental Status: Patient is awake, alert, oriented to person, place, month, year, and situation.*** Patient is able to give a clear and coherent history.*** No signs of aphasia or neglect*** Cranial Nerves: II: Visual Fields are full. Pupils are equal, round, and reactive to light.  *** III,IV, VI: EOMI without ptosis or diploplia.  V: Facial sensation is symmetric to temperature VII: Facial movement is symmetric.  VIII: hearing is intact to voice X: Uvula elevates symmetrically XI: Shoulder shrug is symmetric. XII: tongue is midline without atrophy or fasciculations.  Motor: Tone is spastic. Bulk is normal. 5/5 strength was present in all four extremities expect mild hand weakness 4+/5  Sensory: Left leg reduced sensation to temperature and light touch. Bilateral hand numbness. Some shooting pain in both hands with movement Deep Tendon Reflexes: 3+ to 4+ throughout. Positive hoffman's. Ankle clonus  Cerebellar: FNF and HKS are notable for bilateral tremor. No truncal ataxia Gait:  Able to stand, unable to rise on toes.  NIHSS total *** Score breakdown: *** Performed at *** time of patient arrival to ED    I have reviewed labs in epic and the results pertinent to this consultation are: ***  I have reviewed the images obtained:***   Impression: ***  Recommendations: - ***   Brooke Dare MD-PhD [facility] [phone]   *** [facility], [facility], Teleneuro    Total critical care time: *** minutes   Critical  care time was exclusive of separately billable procedures and treating other patients.   Critical care was necessary to treat or prevent imminent or life-threatening deterioration.   Critical care was time spent personally by me on the following activities: development of treatment plan with patient and/or surrogate as well as nursing, discussions with consultants/primary team, evaluation of patient's response to treatment, examination of patient, obtaining history from patient or surrogate, ordering and performing treatments and interventions, ordering and review of laboratory studies, ordering and review of radiographic studies, and re-evaluation of patient's condition as needed, as documented above.

## 2022-07-17 ENCOUNTER — Emergency Department (HOSPITAL_COMMUNITY): Payer: 59

## 2022-07-17 ENCOUNTER — Other Ambulatory Visit: Payer: Self-pay

## 2022-07-17 ENCOUNTER — Encounter (HOSPITAL_COMMUNITY): Payer: Self-pay | Admitting: Internal Medicine

## 2022-07-17 DIAGNOSIS — G4733 Obstructive sleep apnea (adult) (pediatric): Secondary | ICD-10-CM | POA: Diagnosis not present

## 2022-07-17 DIAGNOSIS — R251 Tremor, unspecified: Secondary | ICD-10-CM

## 2022-07-17 DIAGNOSIS — Z7901 Long term (current) use of anticoagulants: Secondary | ICD-10-CM | POA: Diagnosis not present

## 2022-07-17 DIAGNOSIS — I5022 Chronic systolic (congestive) heart failure: Secondary | ICD-10-CM

## 2022-07-17 DIAGNOSIS — R292 Abnormal reflex: Secondary | ICD-10-CM | POA: Diagnosis not present

## 2022-07-17 DIAGNOSIS — F3170 Bipolar disorder, currently in remission, most recent episode unspecified: Secondary | ICD-10-CM | POA: Diagnosis not present

## 2022-07-17 DIAGNOSIS — Z86718 Personal history of other venous thrombosis and embolism: Secondary | ICD-10-CM | POA: Insufficient documentation

## 2022-07-17 DIAGNOSIS — R29818 Other symptoms and signs involving the nervous system: Secondary | ICD-10-CM | POA: Diagnosis not present

## 2022-07-17 DIAGNOSIS — G252 Other specified forms of tremor: Secondary | ICD-10-CM | POA: Diagnosis not present

## 2022-07-17 LAB — CBC WITH DIFFERENTIAL/PLATELET
Abs Immature Granulocytes: 0.05 10*3/uL (ref 0.00–0.07)
Basophils Absolute: 0 10*3/uL (ref 0.0–0.1)
Basophils Relative: 0 %
Eosinophils Absolute: 0 10*3/uL (ref 0.0–0.5)
Eosinophils Relative: 0 %
HCT: 38.8 % — ABNORMAL LOW (ref 39.0–52.0)
Hemoglobin: 13.2 g/dL (ref 13.0–17.0)
Immature Granulocytes: 0 %
Lymphocytes Relative: 9 %
Lymphs Abs: 1.2 10*3/uL (ref 0.7–4.0)
MCH: 28.6 pg (ref 26.0–34.0)
MCHC: 34 g/dL (ref 30.0–36.0)
MCV: 84 fL (ref 80.0–100.0)
Monocytes Absolute: 0.6 10*3/uL (ref 0.1–1.0)
Monocytes Relative: 4 %
Neutro Abs: 11.1 10*3/uL — ABNORMAL HIGH (ref 1.7–7.7)
Neutrophils Relative %: 87 %
Platelets: 236 10*3/uL (ref 150–400)
RBC: 4.62 MIL/uL (ref 4.22–5.81)
RDW: 15 % (ref 11.5–15.5)
WBC: 13 10*3/uL — ABNORMAL HIGH (ref 4.0–10.5)
nRBC: 0 % (ref 0.0–0.2)

## 2022-07-17 LAB — COMPREHENSIVE METABOLIC PANEL
ALT: 19 U/L (ref 0–44)
AST: 20 U/L (ref 15–41)
Albumin: 3.9 g/dL (ref 3.5–5.0)
Alkaline Phosphatase: 82 U/L (ref 38–126)
Anion gap: 11 (ref 5–15)
BUN: 5 mg/dL — ABNORMAL LOW (ref 6–20)
CO2: 23 mmol/L (ref 22–32)
Calcium: 8.7 mg/dL — ABNORMAL LOW (ref 8.9–10.3)
Chloride: 106 mmol/L (ref 98–111)
Creatinine, Ser: 1.38 mg/dL — ABNORMAL HIGH (ref 0.61–1.24)
GFR, Estimated: 59 mL/min — ABNORMAL LOW (ref >60)
Glucose, Bld: 146 mg/dL — ABNORMAL HIGH (ref 70–99)
Potassium: 4 mmol/L (ref 3.5–5.1)
Sodium: 140 mmol/L (ref 135–145)
Total Bilirubin: 0.5 mg/dL (ref 0.3–1.2)
Total Protein: 7.1 g/dL (ref 6.5–8.1)

## 2022-07-17 LAB — MAGNESIUM: Magnesium: 2.3 mg/dL (ref 1.7–2.4)

## 2022-07-17 LAB — AMMONIA: Ammonia: 31 umol/L (ref 9–35)

## 2022-07-17 LAB — TSH: TSH: 0.091 u[IU]/mL — ABNORMAL LOW (ref 0.350–4.500)

## 2022-07-17 LAB — PROCALCITONIN: Procalcitonin: <0.1 ng/mL

## 2022-07-17 LAB — T4, FREE: Free T4: 0.59 ng/dL — ABNORMAL LOW (ref 0.61–1.12)

## 2022-07-17 LAB — LITHIUM LEVEL: Lithium Lvl: 0.34 mmol/L — ABNORMAL LOW (ref 0.60–1.20)

## 2022-07-17 MED ORDER — MIRTAZAPINE 15 MG PO TABS
45.0000 mg | ORAL_TABLET | Freq: Every day | ORAL | Status: DC
  Administered 2022-07-17: 45 mg via ORAL
  Filled 2022-07-17: qty 3

## 2022-07-17 MED ORDER — CARVEDILOL 6.25 MG PO TABS
6.2500 mg | ORAL_TABLET | Freq: Two times a day (BID) | ORAL | Status: DC
  Administered 2022-07-17 – 2022-07-18 (×3): 6.25 mg via ORAL
  Filled 2022-07-17: qty 2
  Filled 2022-07-17 (×2): qty 1

## 2022-07-17 MED ORDER — DICYCLOMINE HCL 10 MG PO CAPS
10.0000 mg | ORAL_CAPSULE | Freq: Three times a day (TID) | ORAL | Status: DC | PRN
  Administered 2022-07-17: 10 mg via ORAL
  Filled 2022-07-17: qty 1

## 2022-07-17 MED ORDER — SIMETHICONE 80 MG PO CHEW
80.0000 mg | CHEWABLE_TABLET | Freq: Four times a day (QID) | ORAL | Status: DC | PRN
  Administered 2022-07-17: 80 mg via ORAL
  Filled 2022-07-17: qty 1

## 2022-07-17 MED ORDER — PRAVASTATIN SODIUM 10 MG PO TABS
10.0000 mg | ORAL_TABLET | Freq: Every day | ORAL | Status: DC
  Administered 2022-07-17: 10 mg via ORAL
  Filled 2022-07-17: qty 1

## 2022-07-17 MED ORDER — EMPAGLIFLOZIN 10 MG PO TABS
10.0000 mg | ORAL_TABLET | Freq: Every day | ORAL | Status: DC
  Administered 2022-07-17 – 2022-07-18 (×2): 10 mg via ORAL
  Filled 2022-07-17 (×2): qty 1

## 2022-07-17 MED ORDER — MELATONIN 5 MG PO TABS
10.0000 mg | ORAL_TABLET | Freq: Every evening | ORAL | Status: DC | PRN
  Administered 2022-07-17 (×2): 10 mg via ORAL
  Filled 2022-07-17 (×2): qty 2

## 2022-07-17 MED ORDER — IOHEXOL 350 MG/ML SOLN
75.0000 mL | Freq: Once | INTRAVENOUS | Status: AC | PRN
  Administered 2022-07-17: 75 mL via INTRAVENOUS

## 2022-07-17 MED ORDER — ONDANSETRON HCL 4 MG PO TABS: 4.0000 mg | ORAL_TABLET | Freq: Four times a day (QID) | ORAL | Status: DC | PRN

## 2022-07-17 MED ORDER — SACUBITRIL-VALSARTAN 49-51 MG PO TABS
1.0000 | ORAL_TABLET | Freq: Two times a day (BID) | ORAL | Status: DC
  Administered 2022-07-17 – 2022-07-18 (×4): 1 via ORAL
  Filled 2022-07-17 (×4): qty 1

## 2022-07-17 MED ORDER — DABIGATRAN ETEXILATE MESYLATE 150 MG PO CAPS
150.0000 mg | ORAL_CAPSULE | Freq: Two times a day (BID) | ORAL | Status: DC
  Administered 2022-07-17 – 2022-07-18 (×4): 150 mg via ORAL
  Filled 2022-07-17 (×5): qty 1

## 2022-07-17 MED ORDER — SPIRONOLACTONE 25 MG PO TABS
25.0000 mg | ORAL_TABLET | Freq: Every day | ORAL | Status: DC
  Administered 2022-07-17 – 2022-07-18 (×2): 25 mg via ORAL
  Filled 2022-07-17 (×2): qty 1

## 2022-07-17 MED ORDER — ACETAMINOPHEN 650 MG RE SUPP: 650.0000 mg | Freq: Four times a day (QID) | RECTAL | Status: DC | PRN

## 2022-07-17 MED ORDER — QUETIAPINE FUMARATE ER 300 MG PO TB24
800.0000 mg | ORAL_TABLET | Freq: Every day | ORAL | Status: DC
  Administered 2022-07-17: 800 mg via ORAL
  Filled 2022-07-17: qty 1

## 2022-07-17 MED ORDER — ONDANSETRON HCL 4 MG/2ML IJ SOLN
4.0000 mg | Freq: Four times a day (QID) | INTRAMUSCULAR | Status: DC | PRN
  Administered 2022-07-17: 4 mg via INTRAVENOUS

## 2022-07-17 MED ORDER — POLYETHYLENE GLYCOL 3350 17 G PO PACK
17.0000 g | PACK | Freq: Every day | ORAL | Status: DC | PRN
  Administered 2022-07-17: 17 g via ORAL
  Filled 2022-07-17: qty 1

## 2022-07-17 MED ORDER — ACETAMINOPHEN 325 MG PO TABS
650.0000 mg | ORAL_TABLET | Freq: Four times a day (QID) | ORAL | Status: DC | PRN
  Administered 2022-07-17 (×2): 650 mg via ORAL
  Filled 2022-07-17 (×2): qty 2

## 2022-07-17 NOTE — ED Notes (Signed)
No s/s of any distress. Patient up eating breakfast. Will continue to monitor

## 2022-07-17 NOTE — Assessment & Plan Note (Signed)
Continue Pradaxa. Hx of subdural hematoma with supratherapeutic INR

## 2022-07-17 NOTE — Assessment & Plan Note (Signed)
Observation med/tele bed. Hold lithium, seroquel and remeron per neuro consult. Cannot have MRI due to spinal cord stimulator. Neuro to follow.

## 2022-07-17 NOTE — Discharge Instructions (Signed)
From Psychiatry  Hello! Based on your history and exam, I do not think your psychiatric medications caused your tremor. Out of an abudnance of caution, we restarted just the mirtazapine (Remeron) and quetiapine (seroquel) and held the lithium. Next week, talk to Wynne Dust about whether or not it makes sense to restart lithium. Please let him know your level in the hospital was low (0.3). As always, if you have any suicidal thoughts or otherwise feel you are unsafe to yourself or others, please call 911 or go to the nearest ED.

## 2022-07-17 NOTE — Subjective & Objective (Addendum)
CC: tremors HPI: 57 year old male history of chronic anticoagulation due to history of PE and DVT, chronic systolic heart failure EF of 20 to 25%, bipolar disorder, OSA on CPAP, chronic pain status post spinal cord stimulator presents to the ER today with sudden onset of tremors this evening.  He was try to have dinner.  He started having shakes.  He was dropped his dinner plate.  He continued to shake after taking a hot shower.  He thought the hot shower to help him.  Came to the ER for evaluation.  Pt denies taking any herbal supplements. No [place]'s wart.  Pt finished taking prednisone about 7 days ago. Was Rx'd by ER for back pain.  Workup was been negative.  Sodium 140, potassium 3.6, bicarb 23, BUN of 7, creatinine 1.43  White count was 14.7, hemoglobin 13, platelets of 246  Lithium level was low at 0.37.  CT head was negative for intracranial abnormality.  CT angio was negative for large vessel occlusion.  CT C-spine negative for acute fracture or dislocation.  Patient seen by neurology.  They recommended patient be admitted and observed overnight but to hold all serotonergic medications including lithium, Remeron.  [facility] contacted for admission.

## 2022-07-17 NOTE — ED Provider Notes (Signed)
Patient here with tremors.  Workup by afternoon team fairly reassuring.  Consultation with neurology - potential for serotonin syndrome, but not febrile.  Neuro to follow.  Recommends hospitalist admission.  I discussed the case with Dr. Imogene Burn, who is appreciated for admitting.   Jeremih, Dearmas, PA-C 07/17/22 0130    Nira Conn, MD 07/17/22 [phone]

## 2022-07-17 NOTE — Progress Notes (Signed)
SCD placed per order. 

## 2022-07-17 NOTE — H&P (Signed)
History and Physical    Justin [name] GEX:[id number] DOB: [birth year] DOA: 07/16/2022  DOS: the patient was seen and examined on 07/16/2022  PCP: [facility], [facility]   Patient coming from: Home  I have personally briefly reviewed patient's old medical records in [facility]  CC: tremors HPI: 57 year old male history of chronic anticoagulation due to history of PE and DVT, chronic systolic heart failure EF of 20 to 25%, bipolar disorder, OSA on CPAP, chronic pain status post spinal cord stimulator presents to the ER today with sudden onset of tremors this evening.  He was try to have dinner.  He started having shakes.  He was dropped his dinner plate.  He continued to shake after taking a hot shower.  He thought the hot shower to help him.  Came to the ER for evaluation.  Pt denies taking any herbal supplements. No [place]'s wart.  Pt finished taking prednisone about 7 days ago. Was Rx'd by ER for back pain.  Workup was been negative.  Sodium 140, potassium 3.6, bicarb 23, BUN of 7, creatinine 1.43  White count was 14.7, hemoglobin 13, platelets of 246  Lithium level was low at 0.37.  CT head was negative for intracranial abnormality.  CT angio was negative for large vessel occlusion.  CT C-spine negative for acute fracture or dislocation.  Patient seen by neurology.  They recommended patient be admitted and observed overnight but to hold all serotonergic medications including lithium, Remeron.  [facility] contacted for admission.   ED Course: CT head negative. Labs unremarkable except for WBC of 14.7  Review of Systems:  Review of Systems  Constitutional: Negative.  Negative for chills and fever.  HENT: Negative.    Eyes: Negative.   Respiratory: Negative.  Negative for cough and shortness of breath.   Cardiovascular: Negative.   Gastrointestinal: Negative.   Genitourinary: Negative.   Musculoskeletal: Negative.   Skin: Negative.   Neurological:   Positive for tremors.  Endo/Heme/Allergies: Negative.   Psychiatric/Behavioral: Negative.    All other systems reviewed and are negative.   Past Medical History:  Diagnosis Date   Achilles rupture, left    Acute deep vein thrombosis (DVT) of right peroneal vein (HCC) 10/19/2021   Bipolar 1 disorder (HCC)    Dental caries    periodontitis   Pneumonia    Pulmonary embolism (HCC) 05/18/2007   SDH (subdural hematoma) (HCC) 01/04/2013   Sleep apnea    wears CPAP   Subdural hematoma (HCC) 01/04/2013   in setting of supratherapeutic INR   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    Past Surgical History:  Procedure Laterality Date   ACHILLES TENDON SURGERY Left 10/21/2014   Procedure: Left Achilles Reconstruction;  Surgeon: Nadara Mustard, MD;  Location: [facility] OR;  Service: Orthopedics;  Laterality: Left;   APPENDECTOMY     CARDIAC CATHETERIZATION  03/04/2018   [facility]: Normal coronaries, LVEF estimated at 40%, medical Rx   CRANIOTOMY N/A 01/19/2013   Procedure: CRANIOTOMY HEMATOMA EVACUATION SUBDURAL;  Surgeon: Hewitt Shorts, MD;  Location: [facility] NEURO ORS;  Service: Neurosurgery;  Laterality: N/A;   CYSTECTOMY     right head   ELBOW SURGERY     right   FRACTURE SURGERY     finger   I & D EXTREMITY Left 12/07/2016   Procedure: LEFT ACHILLES DEBRIDEMENT;  Surgeon: Nadara Mustard, MD;  Location: [facility] OR;  Service: Orthopedics;  Laterality: Left;   LUMBAR FUSION  12/23/2017   L5  GILL PROCEDURE, RIGHT L5-S1, TRANSFORAMIAL LUMBAR INTERBODY FUSION, BILATERAL LATERAL FUSION, PEDICLE INSTRUMENTATION   MULTIPLE EXTRACTIONS WITH ALVEOLOPLASTY N/A 03/07/2015   Procedure: MULTIPLE EXTRACTION WITH ALVEOLOPLASTY;  Surgeon: [name], DDS;  Location: [facility] OR;  Service: Oral Surgery;  Laterality: N/A;   RIGHT/LEFT HEART CATH AND CORONARY ANGIOGRAPHY N/A 05/18/2022   Procedure: RIGHT/LEFT HEART CATH AND CORONARY ANGIOGRAPHY;  Surgeon: Laurey Morale, MD;  Location: [facility] INVASIVE CV LAB;   Service: Cardiovascular;  Laterality: N/A;   SPINAL CORD STIMULATOR INSERTION N/A 05/18/2019   Procedure: LUMBAR SPINAL CORD STIMULATOR INSERTION;  Surgeon: Odette Fraction, MD;  Location: [facility] OR;  Service: Neurosurgery;  Laterality: N/A;  Thoracic/Lumbar   SPINAL CORD STIMULATOR INSERTION N/A 04/06/2020   Procedure: Revision of spinal cord stimulator;  Surgeon: Renaldo Fiddler, MD;  Location: [facility] OR;  Service: Neurosurgery;  Laterality: N/A;   TEE WITHOUT CARDIOVERSION N/A 05/18/2022   Procedure: TRANSESOPHAGEAL ECHOCARDIOGRAM (TEE);  Surgeon: Laurey Morale, MD;  Location: [facility] ENDOSCOPY;  Service: Cardiovascular;  Laterality: N/A;     reports that he has never smoked. He has never used smokeless tobacco. He reports that he does not drink alcohol and does not use drugs.  Allergies  Allergen Reactions   Lyrica [Pregabalin] Other (See Comments)    Hallucinations    Family History  Problem Relation Age of Onset   Hypertension Mother    Stroke Mother    Multiple sclerosis Sister    Down syndrome Son     Prior to Admission medications   Medication Sig Start Date End Date Taking? Authorizing Provider  carvedilol (COREG) 6.25 MG tablet Take 1 tablet (6.25 mg total) by mouth 2 (two) times daily. 06/26/22 06/26/23 Yes Laurey Morale, MD  dabigatran (PRADAXA) 150 MG CAPS capsule Take 1 capsule (150 mg total) by mouth 2 (two) times daily. 05/19/22  Yes Laurey Morale, MD  empagliflozin (JARDIANCE) 10 MG TABS tablet Take 1 tablet (10 mg total) by mouth daily before breakfast. 06/26/22  Yes Laurey Morale, MD  hydrOXYzine (ATARAX) 25 MG tablet Take 25 mg by mouth 3 (three) times daily as needed for anxiety. 04/25/22  Yes [provider]  lithium 600 MG capsule Take 600 mg by mouth at bedtime.   Yes [provider]  mirtazapine (REMERON) 45 MG tablet Take 45 mg by mouth at bedtime.   Yes [provider]  pravastatin (PRAVACHOL) 10 MG tablet Take 10 mg by mouth daily. 05/12/22  Yes  [provider]  QUEtiapine (SEROQUEL XR) 400 MG 24 hr tablet Take 800 mg by mouth at bedtime. 11/16/21  Yes [provider]  sacubitril-valsartan (ENTRESTO) 49-51 MG Take 1 tablet by mouth 2 (two) times daily. 06/26/22  Yes Laurey Morale, MD  spironolactone (ALDACTONE) 25 MG tablet Take 1 tablet (25 mg total) by mouth daily. 06/26/22 06/26/23 Yes Laurey Morale, MD  HYDROcodone-acetaminophen (NORCO) 7.5-325 MG tablet Take 0.5 tablets by mouth every 6 (six) hours as needed for moderate pain. Patient not taking: Reported on 07/17/2022 05/14/22   [provider]    Physical Exam: Vitals:   07/16/22 2245 07/16/22 2300 07/16/22 2315 07/16/22 2330  BP: (!) 168/101 (!) 164/82 (!) 150/96 (!) 142/77  Pulse: (!) 115 (!) 114 (!) 106 (!) 106  Resp: 17 (!) 35 (!) 22 (!) 23  Temp:      TempSrc:      SpO2: 100% 99% 100% 100%  Weight:      Height:  Physical Exam Vitals and nursing note reviewed.  Constitutional:      General: He is not in acute distress.    Appearance: He is not ill-appearing, toxic-appearing or diaphoretic.  HENT:     Head: Normocephalic and atraumatic.     Nose: Nose normal.  Eyes:     General: No scleral icterus. Cardiovascular:     Rate and Rhythm: Regular rhythm. Tachycardia present.     Pulses: Normal pulses.  Pulmonary:     Effort: Pulmonary effort is normal.     Breath sounds: Normal breath sounds.  Abdominal:     General: Abdomen is flat. Bowel sounds are normal. There is no distension.     Palpations: Abdomen is soft.     Tenderness: There is no abdominal tenderness.  Musculoskeletal:     Right lower leg: No edema.     Left lower leg: No edema.  Skin:    General: Skin is warm and dry.     Capillary Refill: Capillary refill takes less than 2 seconds.  Neurological:     General: No focal deficit present.     Mental Status: He is alert and oriented to person, place, and time.     Comments: + intention tremor of both hands and  feet bilaterally      Labs on Admission: I have personally reviewed following labs and imaging studies  CBC: Recent Labs  Lab 07/16/22 1835  WBC 14.7*  NEUTROABS 12.4*  HGB 13.0  HCT 38.7*  MCV 83.9  PLT 246   Basic Metabolic Panel: Recent Labs  Lab 07/16/22 1835  NA 140  K 3.6  CL 106  CO2 23  GLUCOSE 156*  BUN 7  CREATININE 1.43*  CALCIUM 8.8*   GFR: Estimated Creatinine Clearance: 65.9 mL/min (A) (by C-G formula based on SCr of 1.43 mg/dL (H)). Liver Function Tests: Recent Labs  Lab 07/16/22 1835  AST 20  ALT 18  ALKPHOS 81  BILITOT 0.6  PROT 7.0  ALBUMIN 4.0   BNP (last 3 results) Recent Labs    04/27/22 1605 06/04/22 1504 07/03/22 2122  BNP 88.7 93.1 68.0   Urine analysis:    Component Value Date/Time   COLORURINE STRAW (A) 07/16/2022 1849   APPEARANCEUR CLEAR 07/16/2022 1849   LABSPEC 1.006 07/16/2022 1849   PHURINE 5.0 07/16/2022 1849   GLUCOSEU NEGATIVE 07/16/2022 1849   HGBUR NEGATIVE 07/16/2022 1849   BILIRUBINUR NEGATIVE 07/16/2022 1849   KETONESUR NEGATIVE 07/16/2022 1849   PROTEINUR NEGATIVE 07/16/2022 1849   UROBILINOGEN 0.2 04/04/2014 2056   NITRITE NEGATIVE 07/16/2022 1849   LEUKOCYTESUR NEGATIVE 07/16/2022 1849    Radiological Exams on Admission: I have personally reviewed images CT ANGIO HEAD NECK W WO CM  Result Date: 07/17/2022 CLINICAL DATA:  Acute neurologic deficit.  Tremors EXAM: CT ANGIOGRAPHY HEAD AND NECK TECHNIQUE: Multidetector CT imaging of the head and neck was performed using the standard protocol during bolus administration of intravenous contrast. Multiplanar CT image reconstructions and MIPs were obtained to evaluate the vascular anatomy. Carotid stenosis measurements (when applicable) are obtained utilizing NASCET criteria, using the distal internal carotid diameter as the denominator. RADIATION DOSE REDUCTION: This exam was performed according to the departmental dose-optimization program which includes  automated exposure control, adjustment of the mA and/or kV according to patient size and/or use of iterative reconstruction technique. CONTRAST:  75mL OMNIPAQUE IOHEXOL 350 MG/ML SOLN COMPARISON:  None Available. FINDINGS: CTA NECK FINDINGS SKELETON: There is no bony spinal canal stenosis. No lytic  or blastic lesion. OTHER NECK: Normal pharynx, larynx and major salivary glands. No cervical lymphadenopathy. Unremarkable thyroid gland. UPPER CHEST: No pneumothorax or pleural effusion. No nodules or masses. AORTIC ARCH: There is calcific atherosclerosis of the aortic arch. There is no aneurysm, dissection or hemodynamically significant stenosis of the visualized portion of the aorta. Conventional 3 vessel aortic branching pattern. The visualized proximal subclavian arteries are widely patent. RIGHT CAROTID SYSTEM: Normal without aneurysm, dissection or stenosis. LEFT CAROTID SYSTEM: Normal without aneurysm, dissection or stenosis. VERTEBRAL ARTERIES: Right dominant configuration. Both origins are clearly patent. There is no dissection, occlusion or flow-limiting stenosis to the skull base (V1-V3 segments). CTA HEAD FINDINGS POSTERIOR CIRCULATION: --Vertebral arteries: Normal V4 segments. --Inferior cerebellar arteries: Normal. --Basilar artery: Normal. --Superior cerebellar arteries: Normal. --Posterior cerebral arteries (PCA): Normal. ANTERIOR CIRCULATION: --Intracranial internal carotid arteries: Normal. --Anterior cerebral arteries (ACA): Normal. Both A1 segments are present. Patent anterior communicating artery (a-comm). --Middle cerebral arteries (MCA): Normal. VENOUS SINUSES: As permitted by contrast timing, patent. ANATOMIC VARIANTS: Both P comms are patent. Review of the MIP images confirms the above findings. IMPRESSION: No emergent large vessel occlusion or high-grade stenosis of the intracranial arteries. Electronically Signed   By: Deatra Robinson M.D.   On: 07/17/2022 00:50   CT C-SPINE NO CHARGE  Result  Date: 07/17/2022 CLINICAL DATA:  Extremity tremors EXAM: CT CERVICAL SPINE WITHOUT CONTRAST TECHNIQUE: Multidetector CT imaging of the cervical spine was performed without intravenous contrast. Multiplanar CT image reconstructions were also generated. RADIATION DOSE REDUCTION: This exam was performed according to the departmental dose-optimization program which includes automated exposure control, adjustment of the mA and/or kV according to patient size and/or use of iterative reconstruction technique. COMPARISON:  None Available. FINDINGS: Alignment: No static subluxation. Facets are aligned. Occipital condyles and the lateral masses of C1 and C2 are normally approximated. Skull base and vertebrae: No acute fracture. Soft tissues and spinal canal: No prevertebral fluid or swelling. No visible canal hematoma. Disc levels: No advanced spinal canal or neural foraminal stenosis. Upper chest: No pneumothorax, pulmonary nodule or pleural effusion. Other: Normal visualized paraspinal cervical soft tissues. IMPRESSION: No acute fracture or static subluxation of the cervical spine. Electronically Signed   By: Deatra Robinson M.D.   On: 07/17/2022 00:47   CT Head Wo Contrast  Result Date: 07/16/2022 CLINICAL DATA:  Headache, neuro deficit tremors EXAM: CT HEAD WITHOUT CONTRAST TECHNIQUE: Contiguous axial images were obtained from the base of the skull through the vertex without intravenous contrast. RADIATION DOSE REDUCTION: This exam was performed according to the departmental dose-optimization program which includes automated exposure control, adjustment of the mA and/or kV according to patient size and/or use of iterative reconstruction technique. COMPARISON:  CT head 08/21/2017 FINDINGS: Brain: Patchy and confluent areas of decreased attenuation are noted throughout the deep and periventricular white matter of the cerebral hemispheres bilaterally, compatible with chronic microvascular ischemic disease. No evidence of  large-territorial acute infarction. No parenchymal hemorrhage. No mass lesion. No extra-axial collection. No mass effect or midline shift. No hydrocephalus. Basilar cisterns are patent. Vascular: No hyperdense vessel. Skull: No acute fracture or focal lesion.  Prior right craniotomy. Sinuses/Orbits: Paranasal sinuses and mastoid air cells are clear. The orbits are unremarkable. Other: None. IMPRESSION: No acute intracranial abnormality. Electronically Signed   By: Tish Frederickson M.D.   On: 07/16/2022 19:51   DG Chest Portable 1 View  Result Date: 07/16/2022 CLINICAL DATA:  Tachycardia EXAM: PORTABLE CHEST 1 VIEW COMPARISON:  07/03/2022 FINDINGS: Stable cardiomegaly. Mild  pulmonary vascular congestion. Unchanged lingular scarring or atelectasis. No new airspace consolidation. No pleural effusion or pneumothorax. Spinal stimulator leads again seen in the thoracic region. IMPRESSION: Cardiomegaly with mild pulmonary vascular congestion. Electronically Signed   By: Duanne Guess D.O.   On: 07/16/2022 19:17    EKG: My personal interpretation of EKG shows: sinus tachycardia    Assessment/Plan Principal Problem:   Tremors of nervous system Active Problems:   Chronic anticoagulation   Bipolar disorder, unspecified (HCC)   Obstructive sleep apnea on CPAP   Chronic systolic CHF (congestive heart failure) (HCC)    Assessment and Plan: * Tremors of nervous system Observation med/tele bed. Hold lithium, seroquel and remeron per neuro consult. Cannot have MRI due to spinal cord stimulator. Neuro to follow.  Chronic systolic CHF (congestive heart failure) (HCC) Continue GDMT with coreg 6.25 mg bid, entresto 45/91 bid, aldactone 25 mg daily.  Obstructive sleep apnea on CPAP stable  Bipolar disorder, unspecified (HCC) Stable. Holding lithium for now.  Chronic anticoagulation Continue Pradaxa. Hx of subdural hematoma with supratherapeutic INR    DVT prophylaxis:  Pradaxa Code Status: Full  Code Family Communication: pt's disabled son at bedside  Disposition Plan: return home  Consults called: neurology  Admission status: Observation, Telemetry bed   Carollee Herter, DO [facility] 07/17/2022, 1:39 AM

## 2022-07-17 NOTE — Assessment & Plan Note (Signed)
Stable. Holding lithium for now.

## 2022-07-17 NOTE — ED Notes (Signed)
Pt endorsing constipation at this time; requesting something for same.  This RN will attempt to notify admitting team of same.

## 2022-07-17 NOTE — Progress Notes (Signed)
Patient is now complaining of constipation but notably has marked improvement in his hyperreflexia.  He is no longer having the tremulous movement secondary to severe hyperreflexia and brachioradialis reflexes have normalized on my examination.  This supports a toxic/metabolic etiology of his tremors/hyperreflexia, likely medication related potentially Seroquel, lithium or an interaction between them.  Appreciate full review of medications per primary team for potential contributing medications, and if adjustments are needed may additionally need to reach out to psychiatry for recommendations.  MiraLAX ordered for treatment of constipation  Please reach out to inpatient neurology if additional questions or concerns arise, we will be available as needed going forward

## 2022-07-17 NOTE — Consult Note (Signed)
[facility] Psychiatry New Face-to-Face Psychiatric Evaluation   Service Date: July 17, 2022 LOS:  LOS: 0 days    Assessment  Justin [name] is a 57 y.o. male admitted medically for 07/16/2022  6:01 PM for a tremor. He carries the psychiatric diagnoses of bipolar disorder and has a past medical history of a likely clotting disorder (PE, DVT, etc), dental caries, SDH, sleep apnea compliant w/ CPAP.Psychiatry was consulted for Bipolar disorder on multiple medications, hyperreflexia and tremors, recommendations on medication/interactions  by Dr. Tyson Babinski.    Overall, I do not feel his presentation is consistent with serotonin syndrome or toxicity. While he did present with a tremor on multiple psychiatric medications, no new serotonergic medications have been added recently (until about a month ago he was even on a higher dose of lithium). He also denied sx c/w serotonin syndrome such as diaphoresis, confusion, fever, etc. He also endorses good compliance with meds over past several years (ie did not restart meds after period of noncompliance). It would also be atypical for serotonin syndrome to present in the late afternoon in a patient who takes all of his medications in the PM (this would be close to trough for many of these). Would encourage further workup to include thyroid studies to figure out cause of tremor. We are going to restart seroquel and mirtazapine (mirtazapine is v unlikely to contribute to serotonin syndrome). Will hold lithium out of an abundance of caution; it is the medication on his list most likely to contribute to serotonin syndrome but also of questionable efficacy for bipolar mood stabilization at current dose. Discussed need for close f/u (seeing longstanding psych NP Next week).   Diagnoses:  Active Hospital problems: Principal Problem:   Tremors of nervous system Active Problems:   Chronic anticoagulation   Bipolar disorder, unspecified (HCC)   Obstructive sleep  apnea on CPAP   Chronic systolic CHF (congestive heart failure) (HCC)     Plan  ## Safety and Observation Level:  - Based on my clinical evaluation, I estimate the patient to be at low risk of self harm in the current setting - At this time, we recommend a routine level of observation. This decision is based on my review of the chart including patient's history and current presentation, interview of the patient, mental status examination, and consideration of suicide risk including evaluating suicidal ideation, plan, intent, suicidal or self-harm behaviors, risk factors, and protective factors. This judgment is based on our ability to directly address suicide risk, implement suicide prevention strategies and develop a safety plan while the patient is in the clinical setting. Please contact our team if there is a concern that risk level has changed.   ## Medications:  -- RESTART quetiapine 800 mg -- RESTART mirtazapine 45 mg  Continue to hold lithium  ## Medical Decision Making Capacity:  Not formally assessed  ## Further Work-up:  -- hypothyroid (?central)     -- EKGs this admission with Qtcs in 480s-490s, correcting to 451 using fredericia  -- Pertinent labwork reviewed earlier this admission includes: TSH and T4 both low, UDS negative  ## Disposition:  -- per primary   Thank you for this consult request. Recommendations have been communicated to the primary team.  We will sign off at this time.   [name]   New history   Relevant Aspects of Hospital Course:  Admitted on 07/16/2022 for a tremor.  Patient Report:  Patient was surprised that psychiatry was consulted as he  is doing "fine".  Patient confirms psychiatric history of bipolar disorder.  Most of history spent on recent events, but he broadly confirms history of periods of increasing irritability, impulsive behavior, etc.  Much of this was before) inpatient rehab stay in [place].  He has been on  roughly the current regimen for 10 years, he has been sober and felt stable for 9 years.  He has had a couple episodes of low moods, but nothing really consistent with major depressive disorder or mania over this period of time.  Endorses good compliance with his medication.  The only change in the last couple of months was he stopped his a.m. lithium dose.  He again denied any medication changes, herbs, supplements, illicit substance use to me, and also denied using tramadol and is doing grapefruit juice cleanses specifically.  Denied symptoms of serotonin syndrome outside of well-described tremor (his description very similar to what was documented by neurology) to include fever, confusion, diaphoresis. Also denied sx c/w TD.  I did not specifically ask about diarrhea but he asked his nurse for something to "help him go" a couple of times during brief eval.  Discussed my professional opinion that he did not have serotonin syndrome and that his med psychiatric medications were unlikely to contribute to his tremor, which she showed good understanding of.  Discussed recommendation to continue quetiapine and mirtazapine and discussed lithium with "Dr. Merlyn Albert" next week.   Sister, girlfriend, and son in room with pt consent. They verify compliance with meds and relative stabiliry.  He denied both current and lifetime SI, HI, and AH/VH. Taking care of his disabled son is his main reason to keep going.  Discussed psychiatry would not see again in hospital unless tremors come back.   He slept poorly last night which he attributes to being off meds. No issues with appetite.   ROS:  In HPI  Collateral information:  See above  Psychiatric History:  Information collected from pt on 4/20  No lifetime psych admissions, at least 1 longer term substance rehab Dx bipolar d/o since ?1997 Stable since ~2014 on combination of lithium, quetiapine, mirtazapine  Family psych history: son w/ down's    Social  History:  Lives with son Has girlfriend 4 total kids - 3 boys 1 girl   Tobacco use: no Alcohol use: remote Drug use: remote  Family History:  The patient's family history includes Down syndrome in his son; Hypertension in his mother; Multiple sclerosis in his sister; Stroke in his mother.  Medical History: Past Medical History:  Diagnosis Date   Achilles rupture, left    Acute deep vein thrombosis (DVT) of right peroneal vein (HCC) 10/19/2021   Bipolar 1 disorder (HCC)    Dental caries    periodontitis   Pneumonia    Pulmonary embolism (HCC) 05/18/2007   SDH (subdural hematoma) (HCC) 01/04/2013   Sleep apnea    wears CPAP   Subdural hematoma (HCC) 01/04/2013   in setting of supratherapeutic INR   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    Surgical History: Past Surgical History:  Procedure Laterality Date   ACHILLES TENDON SURGERY Left 10/21/2014   Procedure: Left Achilles Reconstruction;  Surgeon: Nadara Mustard, MD;  Location: [facility] OR;  Service: Orthopedics;  Laterality: Left;   APPENDECTOMY     CARDIAC CATHETERIZATION  03/04/2018   [facility]: Normal coronaries, LVEF estimated at 40%, medical Rx   CRANIOTOMY N/A 01/19/2013   Procedure: CRANIOTOMY HEMATOMA EVACUATION SUBDURAL;  Surgeon: Hewitt Shorts,  MD;  Location: [facility] NEURO ORS;  Service: Neurosurgery;  Laterality: N/A;   CYSTECTOMY     right head   ELBOW SURGERY     right   FRACTURE SURGERY     finger   I & D EXTREMITY Left 12/07/2016   Procedure: LEFT ACHILLES DEBRIDEMENT;  Surgeon: Nadara Mustard, MD;  Location: [facility] OR;  Service: Orthopedics;  Laterality: Left;   LUMBAR FUSION  12/23/2017   L5 GILL PROCEDURE, RIGHT L5-S1, TRANSFORAMIAL LUMBAR INTERBODY FUSION, BILATERAL LATERAL FUSION, PEDICLE INSTRUMENTATION   MULTIPLE EXTRACTIONS WITH ALVEOLOPLASTY N/A 03/07/2015   Procedure: MULTIPLE EXTRACTION WITH ALVEOLOPLASTY;  Surgeon: [name], DDS;  Location: [facility] OR;  Service: Oral Surgery;  Laterality:  N/A;   RIGHT/LEFT HEART CATH AND CORONARY ANGIOGRAPHY N/A 05/18/2022   Procedure: RIGHT/LEFT HEART CATH AND CORONARY ANGIOGRAPHY;  Surgeon: Laurey Morale, MD;  Location: [facility] INVASIVE CV LAB;  Service: Cardiovascular;  Laterality: N/A;   SPINAL CORD STIMULATOR INSERTION N/A 05/18/2019   Procedure: LUMBAR SPINAL CORD STIMULATOR INSERTION;  Surgeon: Odette Fraction, MD;  Location: [facility] OR;  Service: Neurosurgery;  Laterality: N/A;  Thoracic/Lumbar   SPINAL CORD STIMULATOR INSERTION N/A 04/06/2020   Procedure: Revision of spinal cord stimulator;  Surgeon: Renaldo Fiddler, MD;  Location: [facility] OR;  Service: Neurosurgery;  Laterality: N/A;   TEE WITHOUT CARDIOVERSION N/A 05/18/2022   Procedure: TRANSESOPHAGEAL ECHOCARDIOGRAM (TEE);  Surgeon: Laurey Morale, MD;  Location: [facility] ENDOSCOPY;  Service: Cardiovascular;  Laterality: N/A;    Medications:   Current Facility-Administered Medications:    acetaminophen (TYLENOL) tablet 650 mg, 650 mg, Oral, Q6H PRN, 650 mg at 07/17/22 1314 **OR** acetaminophen (TYLENOL) suppository 650 mg, 650 mg, Rectal, Q6H PRN, Carollee Herter, DO   carvedilol (COREG) tablet 6.25 mg, 6.25 mg, Oral, BID, Carollee Herter, DO, 6.25 mg at 07/17/22 0240   dabigatran (PRADAXA) capsule 150 mg, 150 mg, Oral, BID, Carollee Herter, DO, 150 mg at 07/17/22 1308   empagliflozin (JARDIANCE) tablet 10 mg, 10 mg, Oral, QAC breakfast, Carollee Herter, DO, 10 mg at 07/17/22 1309   melatonin tablet 10 mg, 10 mg, Oral, QHS PRN, Carollee Herter, DO, 10 mg at 07/17/22 0401   mirtazapine (REMERON) tablet 45 mg, 45 mg, Oral, QHS, Tayshun Gappa A   ondansetron (ZOFRAN) injection 4 mg, 4 mg, Intravenous, Q6H, Carollee Herter, DO, 4 mg at 07/17/22 1138   ondansetron (ZOFRAN) tablet 4 mg, 4 mg, Oral, Q6H PRN **OR** ondansetron (ZOFRAN) injection 4 mg, 4 mg, Intravenous, Q6H PRN, Carollee Herter, DO   polyethylene glycol (MIRALAX / GLYCOLAX) packet 17 g, 17 g, Oral, Daily PRN, Bhagat, Srishti L, MD, 17 g at 07/17/22 1314   pravastatin  (PRAVACHOL) tablet 10 mg, 10 mg, Oral, Daily, Carollee Herter, DO, 10 mg at 07/17/22 1139   QUEtiapine (SEROQUEL XR) 24 hr tablet 800 mg, 800 mg, Oral, QHS, Pasquale Matters A   sacubitril-valsartan (ENTRESTO) 49-51 mg per tablet, 1 tablet, Oral, BID, Carollee Herter, DO, 1 tablet at 07/17/22 1139   spironolactone (ALDACTONE) tablet 25 mg, 25 mg, Oral, Daily, Carollee Herter, DO, 25 mg at 07/17/22 1138  Allergies: Allergies  Allergen Reactions   Lyrica [Pregabalin] Other (See Comments)    Hallucinations       Objective  Vital signs:  Temp:  [97.6 F (36.4 C)-98.7 F (37.1 C)] 98.7 F (37.1 C) (04/30 0933) Pulse Rate:  [78-126] 80 (04/30 0933) Resp:  [13-35] 17 (04/30 0832) BP: (125-175)/(76-112) 151/108 (04/30 0933) SpO2:  [98 %-100 %] 100 % (04/30  2130) Weight:  [97.5 kg] 97.5 kg (04/29 1822)  Psychiatric Specialty Exam:  Presentation  General Appearance: Fairly Groomed; Appropriate for Environment (no top teeth)  Eye Contact:Good  Speech:Clear and Coherent  Speech Volume:Normal  Handedness:No data recorded  Mood and Affect  Mood:-- (Feeling fine, why are you here?)  Affect:Appropriate; Full Range   Thought Process  Thought Processes:Coherent; Goal Directed  Descriptions of Associations:Intact  Orientation:Full (Time, Place and Person)  Thought Content:-- (devoid of delusions, paranoia)  History of Schizophrenia/Schizoaffective disorder:No data recorded Duration of Psychotic Symptoms:No data recorded Hallucinations:Hallucinations: None  Ideas of Reference:None  Suicidal Thoughts:Suicidal Thoughts: No  Homicidal Thoughts:Homicidal Thoughts: No   Sensorium  Memory:Immediate Good; Recent Good; Remote Good  Judgment:Good  Insight:Good   Executive Functions  Concentration:Good  Attention Span:Good  Recall:Good  Fund of Knowledge:Good  Language:Good   Psychomotor Activity  Psychomotor Activity:Psychomotor Activity: Normal   Assets   Assets:Housing; Intimacy; Leisure Time; Social Support; Resilience; Communication Skills; Desire for Improvement; Financial Resources/Insurance   Sleep  Sleep:Sleep: -- (bad sleep x1 night, usually pretty good)    Physical Exam: Physical Exam HENT:     Head: Normocephalic.  Eyes:     Conjunctiva/sclera: Conjunctivae normal.  Pulmonary:     Effort: Pulmonary effort is normal.  Neurological:     Mental Status: He is alert.     Comments: Had 2-3 beats clonus in b/l l/e, none present in LUE (couldn't get to RUE). Overall within normal limits.      Blood pressure (!) 151/108, pulse 80, temperature 98.7 F (37.1 C), temperature source Oral, resp. rate 17, height 5\' 10"  (1.778 m), weight 97.5 kg, SpO2 100 %. Body mass index is 30.85 kg/m.

## 2022-07-17 NOTE — Assessment & Plan Note (Signed)
Continue GDMT with coreg 6.25 mg bid, entresto 45/91 bid, aldactone 25 mg daily.

## 2022-07-17 NOTE — Progress Notes (Addendum)
Same day note  57 year old male with past medical history of pulmonary embolism and DVT on chronic anticoagulation, chronic systolic heart failure with EF of 20 to 25%, bipolar disorder, obstructive sleep apnea on CPAP, chronic pain status post spinal cord stimulator presented to the hospital with tremors after having dinner.  Patient was recently treated in the ED for back pain and had completed course of prednisone.  In the ED, labs were reasonably within normal limits except for creatinine elevated at 1.4 with mild leukocytosis at 14.7.  Lithium level was low at 0.3.  CT head was negative for acute intracranial abnormality, CT angio was negative for large vessel occlusion, CT spine negative for acute fracture or dislocation.  Patient was seen by neurology and was admitted to the hospital for further evaluation and treatment.  Neurology recommended holding off with serotonergic medication including  lithium, Remeron.  Patient seen and examined at bedside.  Patient was admitted to the hospital for tremors.  At the time of my evaluation, patient complains of difficulty sleeping with some tremors.  Physical examination reveals average built male, mild tremors, mildly anxious,  Laboratory data and imaging was reviewed  Assessment and Plan:  * Tremors of nervous system/hyperreflexia Neurology has been consulted.  Continue to hold lithium, seroquel and remeron per neuro consult. Cannot have MRI due to spinal cord stimulator.  Neurology has seen the patient.  Has improvement in hyperreflexia.  Possible medication induced as per neurology.  Will reach out to psychiatry for potential interactions.  Lithium level low.  Ammonia level low.  Chronic systolic CHF (congestive heart failure) (HCC) Continue coreg 6.25 mg bid, entresto 45/91 bid, aldactone 25 mg daily.  Appears to be compensated.   Obstructive sleep apnea on CPAP Stable.   Bipolar disorder, unspecified (HCC) Holding lithium for now.    Chronic anticoagulation Continue Pradaxa. Hx of subdural hematoma with supratherapeutic INR  Constipation.  Continue MiraLAX.    No Charge  Signed,  Tenny Craw, MD [facility]

## 2022-07-17 NOTE — Assessment & Plan Note (Signed)
stable °

## 2022-07-17 NOTE — ED Notes (Signed)
ED TO INPATIENT HANDOFF REPORT  ED Nurse Name and Phone #:   S Name/Age/Gender [name] 57 y.o. male Room/Bed: 040C/040C  Code Status   Code Status: Full Code  Home/SNF/Other Home Patient oriented to: self, place, time, and situation Is this baseline? Yes   Triage Complete: Triage complete  Chief Complaint Tremors of nervous system [R25.1]  Triage Note Pt BIB EMS from home for new onset of tremors of upper extremities, pt now reporting tremors have spread to bilateral legs. Started at 1630. Pt takes Lithium, Seroquel, Remeron.    EMS VS 184/100 108 18 98% RA   Allergies Allergies  Allergen Reactions   Lyrica [Pregabalin] Other (See Comments)    Hallucinations    Level of Care/Admitting Diagnosis ED Disposition     ED Disposition  Admit   Condition  --   Comment  Hospital Area: [facility] [[id number]]  Level of Care: Telemetry Medical [[id number]]  May place patient in observation at [facility] or [facility] if equivalent level of care is available:: No  Covid Evaluation: Asymptomatic - no recent exposure (last 10 days) testing not required  Diagnosis: Tremors of nervous system [161096]  Admitting Physician: Imogene Burn, ERIC [[id number]]  Attending Physician: Imogene Burn, ERIC [[id number]]          B Medical/Surgery History Past Medical History:  Diagnosis Date   Achilles rupture, left    Acute deep vein thrombosis (DVT) of right peroneal vein (HCC) 10/19/2021   Bipolar 1 disorder (HCC)    Dental caries    periodontitis   Pneumonia    Pulmonary embolism (HCC) 05/18/2007   SDH (subdural hematoma) (HCC) 01/04/2013   Sleep apnea    wears CPAP   Subdural hematoma (HCC) 01/04/2013   in setting of supratherapeutic INR   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   Past Surgical History:  Procedure Laterality Date   ACHILLES TENDON SURGERY Left 10/21/2014   Procedure: Left Achilles Reconstruction;  Surgeon: Nadara Mustard, MD;  Location: [facility] OR;   Service: Orthopedics;  Laterality: Left;   APPENDECTOMY     CARDIAC CATHETERIZATION  03/04/2018   [facility]: Normal coronaries, LVEF estimated at 40%, medical Rx   CRANIOTOMY N/A 01/19/2013   Procedure: CRANIOTOMY HEMATOMA EVACUATION SUBDURAL;  Surgeon: Hewitt Shorts, MD;  Location: [facility] NEURO ORS;  Service: Neurosurgery;  Laterality: N/A;   CYSTECTOMY     right head   ELBOW SURGERY     right   FRACTURE SURGERY     finger   I & D EXTREMITY Left 12/07/2016   Procedure: LEFT ACHILLES DEBRIDEMENT;  Surgeon: Nadara Mustard, MD;  Location: [facility] OR;  Service: Orthopedics;  Laterality: Left;   LUMBAR FUSION  12/23/2017   L5 GILL PROCEDURE, RIGHT L5-S1, TRANSFORAMIAL LUMBAR INTERBODY FUSION, BILATERAL LATERAL FUSION, PEDICLE INSTRUMENTATION   MULTIPLE EXTRACTIONS WITH ALVEOLOPLASTY N/A 03/07/2015   Procedure: MULTIPLE EXTRACTION WITH ALVEOLOPLASTY;  Surgeon: [name], DDS;  Location: [facility] OR;  Service: Oral Surgery;  Laterality: N/A;   RIGHT/LEFT HEART CATH AND CORONARY ANGIOGRAPHY N/A 05/18/2022   Procedure: RIGHT/LEFT HEART CATH AND CORONARY ANGIOGRAPHY;  Surgeon: Laurey Morale, MD;  Location: [facility] INVASIVE CV LAB;  Service: Cardiovascular;  Laterality: N/A;   SPINAL CORD STIMULATOR INSERTION N/A 05/18/2019   Procedure: LUMBAR SPINAL CORD STIMULATOR INSERTION;  Surgeon: Odette Fraction, MD;  Location: [facility] OR;  Service: Neurosurgery;  Laterality: N/A;  Thoracic/Lumbar   SPINAL CORD STIMULATOR INSERTION N/A 04/06/2020   Procedure: Revision of spinal cord stimulator;  Surgeon: Renaldo Fiddler, MD;  Location: [facility] OR;  Service: Neurosurgery;  Laterality: N/A;   TEE WITHOUT CARDIOVERSION N/A 05/18/2022   Procedure: TRANSESOPHAGEAL ECHOCARDIOGRAM (TEE);  Surgeon: Laurey Morale, MD;  Location: [facility] ENDOSCOPY;  Service: Cardiovascular;  Laterality: N/A;     A IV Location/Drains/Wounds Patient Lines/Drains/Airways Status     Active Line/Drains/Airways     Name Placement date Placement time Site Days    Peripheral IV 07/16/22 18 G Right Antecubital 07/16/22  1828  Antecubital  1            Intake/Output Last 24 hours  Intake/Output Summary (Last 24 hours) at 07/17/2022 0831 Last data filed at 07/16/2022 2113 Gross per 24 hour  Intake 300 ml  Output --  Net 300 ml    Labs/Imaging Results for orders placed or performed during the hospital encounter of 07/16/22 (from the past 48 hour(s))  CBC with Differential     Status: Abnormal   Collection Time: 07/16/22  6:35 PM  Result Value Ref Range   WBC 14.7 (H) 4.0 - 10.5 K/uL   RBC 4.61 4.22 - 5.81 MIL/uL   Hemoglobin 13.0 13.0 - 17.0 g/dL   HCT 16.1 (L) 09.6 - 04.5 %   MCV 83.9 80.0 - 100.0 fL   MCH 28.2 26.0 - 34.0 pg   MCHC 33.6 30.0 - 36.0 g/dL   RDW 40.9 81.1 - 91.4 %   Platelets 246 150 - 400 K/uL   nRBC 0.0 0.0 - 0.2 %   Neutrophils Relative % 84 %   Neutro Abs 12.4 (H) 1.7 - 7.7 K/uL   Lymphocytes Relative 10 %   Lymphs Abs 1.5 0.7 - 4.0 K/uL   Monocytes Relative 4 %   Monocytes Absolute 0.6 0.1 - 1.0 K/uL   Eosinophils Relative 1 %   Eosinophils Absolute 0.1 0.0 - 0.5 K/uL   Basophils Relative 1 %   Basophils Absolute 0.1 0.0 - 0.1 K/uL   Immature Granulocytes 0 %   Abs Immature Granulocytes 0.06 0.00 - 0.07 K/uL    Comment: Performed at [facility], [address], [place], [place] [address]  Comprehensive metabolic panel     Status: Abnormal   Collection Time: 07/16/22  6:35 PM  Result Value Ref Range   Sodium 140 135 - 145 mmol/L   Potassium 3.6 3.5 - 5.1 mmol/L   Chloride 106 98 - 111 mmol/L   CO2 23 22 - 32 mmol/L   Glucose, Bld 156 (H) 70 - 99 mg/dL    Comment: Glucose reference range applies only to samples taken after fasting for at least 8 hours.   BUN 7 6 - 20 mg/dL   Creatinine, Ser 6.21 (H) 0.61 - 1.24 mg/dL   Calcium 8.8 (L) 8.9 - 10.3 mg/dL   Total Protein 7.0 6.5 - 8.1 g/dL   Albumin 4.0 3.5 - 5.0 g/dL   AST 20 15 - 41 U/L   ALT 18 0 - 44 U/L   Alkaline Phosphatase 81 38 - 126 U/L    Total Bilirubin 0.6 0.3 - 1.2 mg/dL   GFR, Estimated 57 (L) >60 mL/min    Comment: (NOTE) Calculated using the CKD-EPI Creatinine Equation (2021)    Anion gap 11 5 - 15    Comment: Performed at [facility], [address], [place], [place] [address]  Acetaminophen level     Status: Abnormal   Collection Time: 07/16/22  6:35 PM  Result Value Ref Range  Acetaminophen (Tylenol), Serum <10 (L) 10 - 30 ug/mL    Comment: (NOTE) Therapeutic concentrations vary significantly. A range of 10-30 ug/mL  may be an effective concentration for many patients. However, some  are best treated at concentrations outside of this range. Acetaminophen concentrations >150 ug/mL at 4 hours after ingestion  and >50 ug/mL at 12 hours after ingestion are often associated with  toxic reactions.  Performed at [facility], [address], [place], [place] [address]   Salicylate level     Status: Abnormal   Collection Time: 07/16/22  6:35 PM  Result Value Ref Range   Salicylate Lvl <7.0 (L) 7.0 - 30.0 mg/dL    Comment: Performed at [facility], [address], [place], [place] [address]  Ethanol     Status: None   Collection Time: 07/16/22  6:35 PM  Result Value Ref Range   Alcohol, Ethyl (B) <10 <10 mg/dL    Comment: (NOTE) Lowest detectable limit for serum alcohol is 10 mg/dL.  For medical purposes only. Performed at [facility], [address] [place], [place] [address]   Rapid urine drug screen (hospital performed)     Status: None   Collection Time: 07/16/22  6:49 PM  Result Value Ref Range   Opiates NONE DETECTED NONE DETECTED   Cocaine NONE DETECTED NONE DETECTED   Benzodiazepines NONE DETECTED NONE DETECTED   Amphetamines NONE DETECTED NONE DETECTED   Tetrahydrocannabinol NONE DETECTED NONE DETECTED   Barbiturates NONE DETECTED NONE DETECTED    Comment: (NOTE) DRUG SCREEN FOR MEDICAL PURPOSES ONLY.  IF CONFIRMATION IS NEEDED FOR ANY PURPOSE, NOTIFY  LAB WITHIN 5 DAYS.  LOWEST DETECTABLE LIMITS FOR URINE DRUG SCREEN Drug Class                     Cutoff (ng/mL) Amphetamine and metabolites    1000 Barbiturate and metabolites    200 Benzodiazepine                 200 Opiates and metabolites        300 Cocaine and metabolites        300 THC                            50 Performed at [facility], [address], [place], [place] [address]   Urinalysis, Routine w reflex microscopic -Urine, Unspecified Source     Status: Abnormal   Collection Time: 07/16/22  6:49 PM  Result Value Ref Range   Color, Urine STRAW (A) YELLOW   APPearance CLEAR CLEAR   Specific Gravity, Urine 1.006 1.005 - 1.030   pH 5.0 5.0 - 8.0   Glucose, UA NEGATIVE NEGATIVE mg/dL   Hgb urine dipstick NEGATIVE NEGATIVE   Bilirubin Urine NEGATIVE NEGATIVE   Ketones, ur NEGATIVE NEGATIVE mg/dL   Protein, ur NEGATIVE NEGATIVE mg/dL   Nitrite NEGATIVE NEGATIVE   Leukocytes,Ua NEGATIVE NEGATIVE    Comment: Performed at [facility], [address], [place], [place] [address]  Lithium level     Status: Abnormal   Collection Time: 07/16/22 10:54 PM  Result Value Ref Range   Lithium Lvl 0.37 (L) 0.60 - 1.20 mmol/L    Comment: Performed at [facility], [address], [place], [place] [address]  TSH     Status: Abnormal   Collection Time: 07/17/22 12:46 AM  Result Value  Ref Range   TSH 0.091 (L) 0.350 - 4.500 uIU/mL    Comment: Performed by a 3rd Generation assay with a functional sensitivity of <=0.01 uIU/mL. Performed at [facility], [address], [place], [place] [address]   T4, free     Status: Abnormal   Collection Time: 07/17/22 12:46 AM  Result Value Ref Range   Free T4 0.59 (L) 0.61 - 1.12 ng/dL    Comment: (NOTE) Biotin ingestion may interfere with free T4 tests. If the results are inconsistent with the TSH level, previous test results, or the clinical presentation, then consider biotin interference. If needed, order repeat  testing after stopping biotin. Performed at [facility], [address], [place], [place] [address]   Lithium level     Status: Abnormal   Collection Time: 07/17/22  3:06 AM  Result Value Ref Range   Lithium Lvl 0.34 (L) 0.60 - 1.20 mmol/L    Comment: Performed at [facility], [address], [place], [place] [address]  Ammonia     Status: None   Collection Time: 07/17/22  3:06 AM  Result Value Ref Range   Ammonia 31 9 - 35 umol/L    Comment: Performed at [facility], [address] [place], [place] [address]  Procalcitonin     Status: None   Collection Time: 07/17/22  3:06 AM  Result Value Ref Range   Procalcitonin <0.10 ng/mL    Comment:        Interpretation: PCT (Procalcitonin) <= 0.5 ng/mL: Systemic infection (sepsis) is not likely. Local bacterial infection is possible. (NOTE)       Sepsis PCT Algorithm           Lower Respiratory Tract                                      Infection PCT Algorithm    ----------------------------     ----------------------------         PCT < 0.25 ng/mL                PCT < 0.10 ng/mL          Strongly encourage             Strongly discourage   discontinuation of antibiotics    initiation of antibiotics    ----------------------------     -----------------------------       PCT 0.25 - 0.50 ng/mL            PCT 0.10 - 0.25 ng/mL               OR       >80% decrease in PCT            Discourage initiation of                                            antibiotics      Encourage discontinuation           of antibiotics    ----------------------------     -----------------------------         PCT >= 0.50 ng/mL              PCT 0.26 - 0.50 ng/mL  AND        <80% decrease in PCT             Encourage initiation of                                             antibiotics       Encourage continuation           of antibiotics    ----------------------------     -----------------------------        PCT >= 0.50  ng/mL                  PCT > 0.50 ng/mL               AND         increase in PCT                  Strongly encourage                                      initiation of antibiotics    Strongly encourage escalation           of antibiotics                                     -----------------------------                                           PCT <= 0.25 ng/mL                                                 OR                                        > 80% decrease in PCT                                      Discontinue / Do not initiate                                             antibiotics  Performed at [facility], [address] [place], [place] [address]   Comprehensive metabolic panel     Status: Abnormal   Collection Time: 07/17/22  3:06 AM  Result Value Ref Range   Sodium 140 135 - 145 mmol/L   Potassium 4.0 3.5 - 5.1 mmol/L   Chloride 106 98 - 111 mmol/L   CO2 23 22 - 32 mmol/L   Glucose, Bld 146 (H) 70 - 99 mg/dL    Comment: Glucose reference range applies only to samples taken after fasting for at least 8 hours.  BUN 5 (L) 6 - 20 mg/dL   Creatinine, Ser 1.61 (H) 0.61 - 1.24 mg/dL   Calcium 8.7 (L) 8.9 - 10.3 mg/dL   Total Protein 7.1 6.5 - 8.1 g/dL   Albumin 3.9 3.5 - 5.0 g/dL   AST 20 15 - 41 U/L   ALT 19 0 - 44 U/L   Alkaline Phosphatase 82 38 - 126 U/L   Total Bilirubin 0.5 0.3 - 1.2 mg/dL   GFR, Estimated 59 (L) >60 mL/min    Comment: (NOTE) Calculated using the CKD-EPI Creatinine Equation (2021)    Anion gap 11 5 - 15    Comment: Performed at [facility], [address] [place] [address], [place], [place] [address]  Magnesium     Status: None   Collection Time: 07/17/22  3:06 AM  Result Value Ref Range   Magnesium 2.3 1.7 - 2.4 mg/dL    Comment: Performed at [facility], [address] [place], [place] [address]  CBC with Differential/Platelet     Status: Abnormal   Collection Time: 07/17/22  3:06 AM  Result Value Ref Range   WBC 13.0 (H) 4.0 -  10.5 K/uL   RBC 4.62 4.22 - 5.81 MIL/uL   Hemoglobin 13.2 13.0 - 17.0 g/dL   HCT 11.9 (L) 14.7 - 82.9 %   MCV 84.0 80.0 - 100.0 fL   MCH 28.6 26.0 - 34.0 pg   MCHC 34.0 30.0 - 36.0 g/dL   RDW 56.2 13.0 - 86.5 %   Platelets 236 150 - 400 K/uL   nRBC 0.0 0.0 - 0.2 %   Neutrophils Relative % 87 %   Neutro Abs 11.1 (H) 1.7 - 7.7 K/uL   Lymphocytes Relative 9 %   Lymphs Abs 1.2 0.7 - 4.0 K/uL   Monocytes Relative 4 %   Monocytes Absolute 0.6 0.1 - 1.0 K/uL   Eosinophils Relative 0 %   Eosinophils Absolute 0.0 0.0 - 0.5 K/uL   Basophils Relative 0 %   Basophils Absolute 0.0 0.0 - 0.1 K/uL   Immature Granulocytes 0 %   Abs Immature Granulocytes 0.05 0.00 - 0.07 K/uL    Comment: Performed at [facility], [address], [place], [place] [address]   CT ANGIO HEAD NECK W WO CM  Result Date: 07/17/2022 CLINICAL DATA:  Acute neurologic deficit.  Tremors EXAM: CT ANGIOGRAPHY HEAD AND NECK TECHNIQUE: Multidetector CT imaging of the head and neck was performed using the standard protocol during bolus administration of intravenous contrast. Multiplanar CT image reconstructions and MIPs were obtained to evaluate the vascular anatomy. Carotid stenosis measurements (when applicable) are obtained utilizing NASCET criteria, using the distal internal carotid diameter as the denominator. RADIATION DOSE REDUCTION: This exam was performed according to the departmental dose-optimization program which includes automated exposure control, adjustment of the mA and/or kV according to patient size and/or use of iterative reconstruction technique. CONTRAST:  75mL OMNIPAQUE IOHEXOL 350 MG/ML SOLN COMPARISON:  None Available. FINDINGS: CTA NECK FINDINGS SKELETON: There is no bony spinal canal stenosis. No lytic or blastic lesion. OTHER NECK: Normal pharynx, larynx and major salivary glands. No cervical lymphadenopathy. Unremarkable thyroid gland. UPPER CHEST: No pneumothorax or pleural effusion. No nodules or masses.  AORTIC ARCH: There is calcific atherosclerosis of the aortic arch. There is no aneurysm, dissection or hemodynamically significant stenosis of the visualized portion of the aorta. Conventional 3 vessel aortic branching pattern. The visualized proximal subclavian arteries are widely patent. RIGHT CAROTID SYSTEM: Normal without  aneurysm, dissection or stenosis. LEFT CAROTID SYSTEM: Normal without aneurysm, dissection or stenosis. VERTEBRAL ARTERIES: Right dominant configuration. Both origins are clearly patent. There is no dissection, occlusion or flow-limiting stenosis to the skull base (V1-V3 segments). CTA HEAD FINDINGS POSTERIOR CIRCULATION: --Vertebral arteries: Normal V4 segments. --Inferior cerebellar arteries: Normal. --Basilar artery: Normal. --Superior cerebellar arteries: Normal. --Posterior cerebral arteries (PCA): Normal. ANTERIOR CIRCULATION: --Intracranial internal carotid arteries: Normal. --Anterior cerebral arteries (ACA): Normal. Both A1 segments are present. Patent anterior communicating artery (a-comm). --Middle cerebral arteries (MCA): Normal. VENOUS SINUSES: As permitted by contrast timing, patent. ANATOMIC VARIANTS: Both P comms are patent. Review of the MIP images confirms the above findings. IMPRESSION: No emergent large vessel occlusion or high-grade stenosis of the intracranial arteries. Electronically Signed   By: Deatra Robinson M.D.   On: 07/17/2022 00:50   CT C-SPINE NO CHARGE  Result Date: 07/17/2022 CLINICAL DATA:  Extremity tremors EXAM: CT CERVICAL SPINE WITHOUT CONTRAST TECHNIQUE: Multidetector CT imaging of the cervical spine was performed without intravenous contrast. Multiplanar CT image reconstructions were also generated. RADIATION DOSE REDUCTION: This exam was performed according to the departmental dose-optimization program which includes automated exposure control, adjustment of the mA and/or kV according to patient size and/or use of iterative reconstruction technique.  COMPARISON:  None Available. FINDINGS: Alignment: No static subluxation. Facets are aligned. Occipital condyles and the lateral masses of C1 and C2 are normally approximated. Skull base and vertebrae: No acute fracture. Soft tissues and spinal canal: No prevertebral fluid or swelling. No visible canal hematoma. Disc levels: No advanced spinal canal or neural foraminal stenosis. Upper chest: No pneumothorax, pulmonary nodule or pleural effusion. Other: Normal visualized paraspinal cervical soft tissues. IMPRESSION: No acute fracture or static subluxation of the cervical spine. Electronically Signed   By: Deatra Robinson M.D.   On: 07/17/2022 00:47   CT Head Wo Contrast  Result Date: 07/16/2022 CLINICAL DATA:  Headache, neuro deficit tremors EXAM: CT HEAD WITHOUT CONTRAST TECHNIQUE: Contiguous axial images were obtained from the base of the skull through the vertex without intravenous contrast. RADIATION DOSE REDUCTION: This exam was performed according to the departmental dose-optimization program which includes automated exposure control, adjustment of the mA and/or kV according to patient size and/or use of iterative reconstruction technique. COMPARISON:  CT head 08/21/2017 FINDINGS: Brain: Patchy and confluent areas of decreased attenuation are noted throughout the deep and periventricular white matter of the cerebral hemispheres bilaterally, compatible with chronic microvascular ischemic disease. No evidence of large-territorial acute infarction. No parenchymal hemorrhage. No mass lesion. No extra-axial collection. No mass effect or midline shift. No hydrocephalus. Basilar cisterns are patent. Vascular: No hyperdense vessel. Skull: No acute fracture or focal lesion.  Prior right craniotomy. Sinuses/Orbits: Paranasal sinuses and mastoid air cells are clear. The orbits are unremarkable. Other: None. IMPRESSION: No acute intracranial abnormality. Electronically Signed   By: Tish Frederickson M.D.   On: 07/16/2022  19:51   DG Chest Portable 1 View  Result Date: 07/16/2022 CLINICAL DATA:  Tachycardia EXAM: PORTABLE CHEST 1 VIEW COMPARISON:  07/03/2022 FINDINGS: Stable cardiomegaly. Mild pulmonary vascular congestion. Unchanged lingular scarring or atelectasis. No new airspace consolidation. No pleural effusion or pneumothorax. Spinal stimulator leads again seen in the thoracic region. IMPRESSION: Cardiomegaly with mild pulmonary vascular congestion. Electronically Signed   By: Duanne Guess D.O.   On: 07/16/2022 19:17    Pending Labs Unresulted Labs (From admission, onward)    None       Vitals/Pain Today's Vitals   07/17/22 0325  07/17/22 0400 07/17/22 0506 07/17/22 0810  BP:   (!) 138/98   Pulse: 92 85 85 81  Resp: (!) 21 19 19 20   Temp:   97.9 F (36.6 C)   TempSrc:   Oral   SpO2: 100% 100% 100% 100%  Weight:      Height:      PainSc:        Isolation Precautions No active isolations  Medications Medications  ondansetron (ZOFRAN) injection 4 mg (4 mg Intravenous Given 07/17/22 0620)  carvedilol (COREG) tablet 6.25 mg (6.25 mg Oral Given 07/17/22 0240)  pravastatin (PRAVACHOL) tablet 10 mg (has no administration in time range)  sacubitril-valsartan (ENTRESTO) 49-51 mg per tablet (1 tablet Oral Given 07/17/22 0240)  spironolactone (ALDACTONE) tablet 25 mg (has no administration in time range)  empagliflozin (JARDIANCE) tablet 10 mg (has no administration in time range)  dabigatran (PRADAXA) capsule 150 mg (150 mg Oral Given 07/17/22 0301)  acetaminophen (TYLENOL) tablet 650 mg (650 mg Oral Given 07/17/22 0240)    Or  acetaminophen (TYLENOL) suppository 650 mg ( Rectal See Alternative 07/17/22 0240)  ondansetron (ZOFRAN) tablet 4 mg (has no administration in time range)    Or  ondansetron (ZOFRAN) injection 4 mg (has no administration in time range)  melatonin tablet 10 mg (10 mg Oral Given 07/17/22 0401)  polyethylene glycol (MIRALAX / GLYCOLAX) packet 17 g (has no administration in  time range)  lactated ringers bolus 1,000 mL (0 mLs Intravenous Stopped 07/16/22 2113)  iohexol (OMNIPAQUE) 350 MG/ML injection 75 mL (75 mLs Intravenous Contrast Given 07/17/22 0035)    Mobility walks     Focused Assessments    R Recommendations: See Admitting Provider Note  Report given to:   Additional Notes:

## 2022-07-18 DIAGNOSIS — Z7901 Long term (current) use of anticoagulants: Secondary | ICD-10-CM | POA: Diagnosis not present

## 2022-07-18 DIAGNOSIS — R251 Tremor, unspecified: Secondary | ICD-10-CM | POA: Diagnosis not present

## 2022-07-18 DIAGNOSIS — G4733 Obstructive sleep apnea (adult) (pediatric): Secondary | ICD-10-CM | POA: Diagnosis not present

## 2022-07-18 DIAGNOSIS — F3170 Bipolar disorder, currently in remission, most recent episode unspecified: Secondary | ICD-10-CM | POA: Diagnosis not present

## 2022-07-18 DIAGNOSIS — I5022 Chronic systolic (congestive) heart failure: Secondary | ICD-10-CM | POA: Diagnosis not present

## 2022-07-18 LAB — T3, FREE: T3, Free: 2.4 pg/mL (ref 2.0–4.4)

## 2022-07-18 NOTE — Discharge Summary (Signed)
Physician Discharge Summary  Justin [name] ZOX:[id number] DOB: 1965-09-07 DOA: 07/16/2022  PCP: [facility]  Admit date: 07/16/2022 Discharge date: 07/18/2022  Admitted From: Home  Discharge disposition: Home   Recommendations for Outpatient Follow-Up:   Follow up with psychiatry as outpatient in 1 week. Follow-up with your primary care provider as scheduled by you.   Discharge Diagnosis:   Principal Problem:   Tremors of nervous system Active Problems:   Chronic anticoagulation   Bipolar disorder, unspecified (HCC)   Obstructive sleep apnea on CPAP   Chronic systolic CHF (congestive heart failure) (HCC)   Discharge Condition: Improved.  Diet recommendation: Low sodium, heart healthy.   Wound care: None.  Code status: Full.   History of Present Illness:   57 year old male with past medical history of pulmonary embolism and DVT on chronic anticoagulation, chronic systolic heart failure with EF of 20 to 25%, bipolar disorder, obstructive sleep apnea on CPAP, chronic pain status post spinal cord stimulator presented to the hospital with tremors after having dinner. Patient was recently treated in the ED for back pain and had completed course of prednisone. In the ED, labs were reasonably within normal limits except for creatinine elevated at 1.4 with mild leukocytosis at 14.7. Lithium level was low at 0.3. CT head was negative for acute intracranial abnormality, CT angio was negative for large vessel occlusion, CT spine negative for acute fracture or dislocation. Patient was seen by neurology and was admitted to the hospital for further evaluation and treatment. Neurology recommended holding off with serotonergic medication including lithium, Remeron.   Hospital Course:   Following conditions were addressed during hospitalization as listed below,  Tremors of nervous system/hyperreflexia Improved at this time.  Neurology was consulted and initially recommended to hold   lithium, seroquel and Remeron. Cannot have MRI due to spinal cord stimulator.  At this time tremors and hyperreflexia have improved.  Psychiatry also saw the patient and there is no concern for serotonin syndrome.  Psychiatry advised to discontinue lithium since level was low on presentation anyway and will need to follow-up with psychiatry as outpatient to discuss about it.  Ammonia level low.   Chronic systolic CHF (congestive heart failure) (HCC) Continue coreg 6.25 mg bid, entresto 45/91 bid, aldactone 25 mg daily.  Compensated at this time.  Obstructive sleep apnea on CPAP Stable.   Bipolar disorder, unspecified (HCC) Patient will be discontinued on discharge.   Chronic anticoagulation Continue Pradaxa. Hx of subdural hematoma with supratherapeutic INR   Constipation.  Continue MiraLAX.  Disposition.  At this time, patient is stable for disposition home with outpatient PCP and psychiatry follow-up.  Medical Consultants:   Psychiatry,  neurology  Procedures:    None Subjective:   Today, patient was seen and examined at bedside.  Denies any tremors.  Feels okay.  Discharge Exam:   Vitals:   07/18/22 0350 07/18/22 0740  BP: 95/66 113/73  Pulse:  (!) 52  Resp: 18 16  Temp: (!) 97.5 F (36.4 C) 97.6 F (36.4 C)  SpO2: 100% 100%   Vitals:   07/17/22 1535 07/17/22 2046 07/18/22 0350 07/18/22 0740  BP: (!) 143/92 (!) 129/94 95/66 113/73  Pulse: 75 75  (!) 52  Resp: 18 18 18 16   Temp: 98.4 F (36.9 C) 98.2 F (36.8 C) (!) 97.5 F (36.4 C) 97.6 F (36.4 C)  TempSrc: Oral   Oral  SpO2: 97% 100% 100% 100%  Weight:      Height:  Body mass index is 30.85 kg/m.  General: Alert awake, not in obvious distress, obese HENT: pupils equally reacting to light,  No scleral pallor or icterus noted. Oral mucosa is moist.  Chest:  Clear breath sounds.  No crackles or wheezes.  CVS: S1 &S2 heard. No murmur.  Regular rate and rhythm. Abdomen: Soft, nontender,  nondistended.  Bowel sounds are heard.   Extremities: No cyanosis, clubbing or edema.  Peripheral pulses are palpable. Psych: Alert, awake and oriented, normal mood CNS:  No cranial nerve deficits.  Power equal in all extremities.  No tremors. Skin: Warm and dry.  No rashes noted.  The results of significant diagnostics from this hospitalization (including imaging, microbiology, ancillary and laboratory) are listed below for reference.     Diagnostic Studies:   CT ANGIO HEAD NECK W WO CM  Result Date: 07/17/2022 CLINICAL DATA:  Acute neurologic deficit.  Tremors EXAM: CT ANGIOGRAPHY HEAD AND NECK TECHNIQUE: Multidetector CT imaging of the head and neck was performed using the standard protocol during bolus administration of intravenous contrast. Multiplanar CT image reconstructions and MIPs were obtained to evaluate the vascular anatomy. Carotid stenosis measurements (when applicable) are obtained utilizing NASCET criteria, using the distal internal carotid diameter as the denominator. RADIATION DOSE REDUCTION: This exam was performed according to the departmental dose-optimization program which includes automated exposure control, adjustment of the mA and/or kV according to patient size and/or use of iterative reconstruction technique. CONTRAST:  75mL OMNIPAQUE IOHEXOL 350 MG/ML SOLN COMPARISON:  None Available. FINDINGS: CTA NECK FINDINGS SKELETON: There is no bony spinal canal stenosis. No lytic or blastic lesion. OTHER NECK: Normal pharynx, larynx and major salivary glands. No cervical lymphadenopathy. Unremarkable thyroid gland. UPPER CHEST: No pneumothorax or pleural effusion. No nodules or masses. AORTIC ARCH: There is calcific atherosclerosis of the aortic arch. There is no aneurysm, dissection or hemodynamically significant stenosis of the visualized portion of the aorta. Conventional 3 vessel aortic branching pattern. The visualized proximal subclavian arteries are widely patent. RIGHT CAROTID  SYSTEM: Normal without aneurysm, dissection or stenosis. LEFT CAROTID SYSTEM: Normal without aneurysm, dissection or stenosis. VERTEBRAL ARTERIES: Right dominant configuration. Both origins are clearly patent. There is no dissection, occlusion or flow-limiting stenosis to the skull base (V1-V3 segments). CTA HEAD FINDINGS POSTERIOR CIRCULATION: --Vertebral arteries: Normal V4 segments. --Inferior cerebellar arteries: Normal. --Basilar artery: Normal. --Superior cerebellar arteries: Normal. --Posterior cerebral arteries (PCA): Normal. ANTERIOR CIRCULATION: --Intracranial internal carotid arteries: Normal. --Anterior cerebral arteries (ACA): Normal. Both A1 segments are present. Patent anterior communicating artery (a-comm). --Middle cerebral arteries (MCA): Normal. VENOUS SINUSES: As permitted by contrast timing, patent. ANATOMIC VARIANTS: Both P comms are patent. Review of the MIP images confirms the above findings. IMPRESSION: No emergent large vessel occlusion or high-grade stenosis of the intracranial arteries. Electronically Signed   By: Deatra Robinson M.D.   On: 07/17/2022 00:50   CT C-SPINE NO CHARGE  Result Date: 07/17/2022 CLINICAL DATA:  Extremity tremors EXAM: CT CERVICAL SPINE WITHOUT CONTRAST TECHNIQUE: Multidetector CT imaging of the cervical spine was performed without intravenous contrast. Multiplanar CT image reconstructions were also generated. RADIATION DOSE REDUCTION: This exam was performed according to the departmental dose-optimization program which includes automated exposure control, adjustment of the mA and/or kV according to patient size and/or use of iterative reconstruction technique. COMPARISON:  None Available. FINDINGS: Alignment: No static subluxation. Facets are aligned. Occipital condyles and the lateral masses of C1 and C2 are normally approximated. Skull base and vertebrae: No acute fracture. Soft tissues and  spinal canal: No prevertebral fluid or swelling. No visible canal  hematoma. Disc levels: No advanced spinal canal or neural foraminal stenosis. Upper chest: No pneumothorax, pulmonary nodule or pleural effusion. Other: Normal visualized paraspinal cervical soft tissues. IMPRESSION: No acute fracture or static subluxation of the cervical spine. Electronically Signed   By: Deatra Robinson M.D.   On: 07/17/2022 00:47   CT Head Wo Contrast  Result Date: 07/16/2022 CLINICAL DATA:  Headache, neuro deficit tremors EXAM: CT HEAD WITHOUT CONTRAST TECHNIQUE: Contiguous axial images were obtained from the base of the skull through the vertex without intravenous contrast. RADIATION DOSE REDUCTION: This exam was performed according to the departmental dose-optimization program which includes automated exposure control, adjustment of the mA and/or kV according to patient size and/or use of iterative reconstruction technique. COMPARISON:  CT head 08/21/2017 FINDINGS: Brain: Patchy and confluent areas of decreased attenuation are noted throughout the deep and periventricular white matter of the cerebral hemispheres bilaterally, compatible with chronic microvascular ischemic disease. No evidence of large-territorial acute infarction. No parenchymal hemorrhage. No mass lesion. No extra-axial collection. No mass effect or midline shift. No hydrocephalus. Basilar cisterns are patent. Vascular: No hyperdense vessel. Skull: No acute fracture or focal lesion.  Prior right craniotomy. Sinuses/Orbits: Paranasal sinuses and mastoid air cells are clear. The orbits are unremarkable. Other: None. IMPRESSION: No acute intracranial abnormality. Electronically Signed   By: Tish Frederickson M.D.   On: 07/16/2022 19:51   DG Chest Portable 1 View  Result Date: 07/16/2022 CLINICAL DATA:  Tachycardia EXAM: PORTABLE CHEST 1 VIEW COMPARISON:  07/03/2022 FINDINGS: Stable cardiomegaly. Mild pulmonary vascular congestion. Unchanged lingular scarring or atelectasis. No new airspace consolidation. No pleural effusion  or pneumothorax. Spinal stimulator leads again seen in the thoracic region. IMPRESSION: Cardiomegaly with mild pulmonary vascular congestion. Electronically Signed   By: Duanne Guess D.O.   On: 07/16/2022 19:17     Labs:   Basic Metabolic Panel: Recent Labs  Lab 07/16/22 1835 07/17/22 0306  NA 140 140  K 3.6 4.0  CL 106 106  CO2 23 23  GLUCOSE 156* 146*  BUN 7 5*  CREATININE 1.43* 1.38*  CALCIUM 8.8* 8.7*  MG  --  2.3   GFR Estimated Creatinine Clearance: 68.3 mL/min (A) (by C-G formula based on SCr of 1.38 mg/dL (H)). Liver Function Tests: Recent Labs  Lab 07/16/22 1835 07/17/22 0306  AST 20 20  ALT 18 19  ALKPHOS 81 82  BILITOT 0.6 0.5  PROT 7.0 7.1  ALBUMIN 4.0 3.9   No results for input(s): "LIPASE", "AMYLASE" in the last 168 hours. Recent Labs  Lab 07/17/22 0306  AMMONIA 31   Coagulation profile No results for input(s): "INR", "PROTIME" in the last 168 hours.  CBC: Recent Labs  Lab 07/16/22 1835 07/17/22 0306  WBC 14.7* 13.0*  NEUTROABS 12.4* 11.1*  HGB 13.0 13.2  HCT 38.7* 38.8*  MCV 83.9 84.0  PLT 246 236   Cardiac Enzymes: No results for input(s): "CKTOTAL", "CKMB", "CKMBINDEX", "TROPONINI" in the last 168 hours. BNP: Invalid input(s): "POCBNP" CBG: No results for input(s): "GLUCAP" in the last 168 hours. D-Dimer No results for input(s): "DDIMER" in the last 72 hours. Hgb A1c No results for input(s): "HGBA1C" in the last 72 hours. Lipid Profile No results for input(s): "CHOL", "HDL", "LDLCALC", "TRIG", "CHOLHDL", "LDLDIRECT" in the last 72 hours. Thyroid function studies Recent Labs    07/17/22 0046  TSH 0.091*   Anemia work up No results for input(s): "VITAMINB12", "FOLATE", "FERRITIN", "  TIBC", "IRON", "RETICCTPCT" in the last 72 hours. Microbiology No results found for this or any previous visit (from the past 240 hour(s)).   Discharge Instructions:   Discharge Instructions     Diet - low sodium heart healthy   Complete  by: As directed    Discharge instructions   Complete by: As directed    Follow-up with the psychiatry nurse practitioner as has been scheduled for next week and discuss about medications including lithium.  Seek medical attention for worsening symptoms.   Increase activity slowly   Complete by: As directed       Allergies as of 07/18/2022       Reactions   Lyrica [pregabalin] Other (See Comments)   Hallucinations        Medication List     STOP taking these medications    HYDROcodone-acetaminophen 7.5-325 MG tablet Commonly known as: NORCO   lithium 600 MG capsule       TAKE these medications    carvedilol 6.25 MG tablet Commonly known as: Coreg Take 1 tablet (6.25 mg total) by mouth 2 (two) times daily.   dabigatran 150 MG Caps capsule Commonly known as: Pradaxa Take 1 capsule (150 mg total) by mouth 2 (two) times daily.   empagliflozin 10 MG Tabs tablet Commonly known as: Jardiance Take 1 tablet (10 mg total) by mouth daily before breakfast.   Entresto 49-51 MG Generic drug: sacubitril-valsartan Take 1 tablet by mouth 2 (two) times daily.   hydrOXYzine 25 MG tablet Commonly known as: ATARAX Take 25 mg by mouth 3 (three) times daily as needed for anxiety.   mirtazapine 45 MG tablet Commonly known as: REMERON Take 45 mg by mouth at bedtime.   pravastatin 10 MG tablet Commonly known as: PRAVACHOL Take 10 mg by mouth daily.   QUEtiapine 400 MG 24 hr tablet Commonly known as: SEROQUEL XR Take 800 mg by mouth at bedtime.   spironolactone 25 MG tablet Commonly known as: Aldactone Take 1 tablet (25 mg total) by mouth daily.          Time coordinating discharge: 39 minutes  Signed:  Torben Soloway  [facility] 07/18/2022, 2:10 PM

## 2022-07-20 ENCOUNTER — Encounter (HOSPITAL_COMMUNITY): Payer: 59

## 2022-07-24 ENCOUNTER — Telehealth (HOSPITAL_COMMUNITY): Payer: Self-pay

## 2022-07-24 NOTE — Telephone Encounter (Signed)
Attempted to reach pt regarding home visit this week-his phone rang 1/2 a time and then it went straight to VM. I was able to LVM for him to return my call.    Kerry Hough, EMT-Paramedic  [phone] 07/24/2022

## 2022-07-26 ENCOUNTER — Other Ambulatory Visit (HOSPITAL_COMMUNITY): Payer: Self-pay

## 2022-07-26 NOTE — Progress Notes (Signed)
Paramedicine Encounter    Patient ID: Justin [name], male    DOB: 09-Nov-1965, 57 y.o.   MRN: [id number]   Complaints-none  Edema-none   Compliance with meds-yes  Pill box filled-no  If so, by whom-n/a  Refills needed-he handles meds   Pt reports he is doing good. He was in the ER last week for tremors-he was taken off lithium. Pt reports feeling ok since he left the ER. No issues since.  He went to psych doc yesterday and holding lithium for now and will keep in contact with that doc.  Pt reports his breathing is doing good.  No c/p or dizziness. No issues or concerns at this time.   Appetite staying same. No edema noted.   BP 116/80   Pulse 78   Resp 16   Wt 217 lb (98.4 kg)   SpO2 98%   BMI 31.14 kg/m  Weight yesterday-? Last visit weight-216  Patient Care Team: [facility], [facility] as PCP - General (Internal Medicine) Christell Constant, MD as PCP - Cardiology (Cardiology)  Patient Active Problem List   Diagnosis Date Noted   History of DVT (deep vein thrombosis) 07/17/2022   Tremors of nervous system 07/17/2022   Nonischemic cardiomyopathy (HCC) 05/18/2022   Chronic systolic CHF (congestive heart failure) (HCC) 05/18/2022   Snoring 04/04/2022   Normocytic anemia 12/22/2021   History of pulmonary embolism 10/19/2021   Status post lumbar spinal fusion 01/03/2018   Lumbar stenosis 12/23/2017   Spondylolisthesis, lumbar region 06/25/2017   Achilles tendinitis of left lower extremity 12/07/2016   Achilles tendinitis, left leg 05/17/2016   Obstructive sleep apnea on CPAP 03/07/2015   Achilles rupture, left 10/21/2014   Bipolar disorder, unspecified (HCC) 01/07/2013   Headache(784.0) 01/07/2013   Chronic anticoagulation 01/04/2013    Current Outpatient Medications:    carvedilol (COREG) 6.25 MG tablet, Take 1 tablet (6.25 mg total) by mouth 2 (two) times daily., Disp: 60 tablet, Rfl: 11   dabigatran (PRADAXA) 150 MG CAPS capsule, Take 1 capsule (150 mg  total) by mouth 2 (two) times daily., Disp: 180 capsule, Rfl: 0   empagliflozin (JARDIANCE) 10 MG TABS tablet, Take 1 tablet (10 mg total) by mouth daily before breakfast., Disp: 30 tablet, Rfl: 11   hydrOXYzine (ATARAX) 25 MG tablet, Take 25 mg by mouth 3 (three) times daily as needed for anxiety., Disp: , Rfl:    mirtazapine (REMERON) 45 MG tablet, Take 45 mg by mouth at bedtime., Disp: , Rfl:    pravastatin (PRAVACHOL) 10 MG tablet, Take 10 mg by mouth daily., Disp: , Rfl:    QUEtiapine (SEROQUEL XR) 400 MG 24 hr tablet, Take 800 mg by mouth at bedtime., Disp: , Rfl:    sacubitril-valsartan (ENTRESTO) 49-51 MG, Take 1 tablet by mouth 2 (two) times daily., Disp: 60 tablet, Rfl: 0   spironolactone (ALDACTONE) 25 MG tablet, Take 1 tablet (25 mg total) by mouth daily., Disp: 30 tablet, Rfl: 11 Allergies  Allergen Reactions   Lyrica [Pregabalin] Other (See Comments)    Hallucinations      Social History   Socioeconomic History   Marital status: Single    Spouse name: Not on file   Number of children: Not on file   Years of education: Not on file   Highest education level: Not on file  Occupational History   Occupation: disability  Tobacco Use   Smoking status: Never   Smokeless tobacco: Never  Vaping Use   Vaping Use: Never used  Substance and Sexual Activity   Alcohol use: No   Drug use: No   Sexual activity: Not on file  Other Topics Concern   Not on file  Social History Narrative   FROM [place], [place]    Lives in a 2 story apartment.  His cousin is currently staying with him.  Has 4 children.  On disability for the past 10 years for pulmonary embolism, bipolar disease.  Education: high school.   Social Determinants of Health   Financial Resource Strain: Not on file  Food Insecurity: No Food Insecurity (07/17/2022)   Hunger Vital Sign    Worried About Running Out of Food in the Last Year: Never true    Ran Out of Food in the Last Year: Never true  Transportation Needs:  No Transportation Needs (07/17/2022)   PRAPARE - Administrator, Civil Service (Medical): No    Lack of Transportation (Non-Medical): No  Physical Activity: Not on file  Stress: Not on file  Social Connections: Not on file  Intimate Partner Violence: Not At Risk (07/17/2022)   Humiliation, Afraid, Rape, and Kick questionnaire    Fear of Current or Ex-Partner: No    Emotionally Abused: No    Physically Abused: No    Sexually Abused: No    Physical Exam      Future Appointments  Date Time Provider Department Center  07/31/2022  9:00 AM Christell Constant, MD [facility]  08/06/2022  2:30 PM [facility] PA/NP [facility] None  10/19/2022  2:00 PM [facility] None  10/19/2022  2:40 PM Jaci Standard, MD [facility] None       Kerry Hough, Paramedic [phone] [facility] Paramedic  07/26/22

## 2022-07-31 ENCOUNTER — Ambulatory Visit: Payer: 59 | Attending: Internal Medicine | Admitting: Internal Medicine

## 2022-07-31 ENCOUNTER — Encounter: Payer: Self-pay | Admitting: Internal Medicine

## 2022-07-31 VITALS — BP 92/63 | HR 70 | Ht 69.0 in | Wt 214.0 lb

## 2022-07-31 DIAGNOSIS — G4733 Obstructive sleep apnea (adult) (pediatric): Secondary | ICD-10-CM

## 2022-07-31 DIAGNOSIS — I428 Other cardiomyopathies: Secondary | ICD-10-CM

## 2022-07-31 DIAGNOSIS — R5383 Other fatigue: Secondary | ICD-10-CM | POA: Insufficient documentation

## 2022-07-31 DIAGNOSIS — Z86718 Personal history of other venous thrombosis and embolism: Secondary | ICD-10-CM | POA: Diagnosis not present

## 2022-07-31 DIAGNOSIS — I5022 Chronic systolic (congestive) heart failure: Secondary | ICD-10-CM

## 2022-07-31 LAB — ANEMIA PANEL
Ferritin: 87 ng/mL (ref 30–400)
UIBC: 198 ug/dL (ref 111–343)

## 2022-07-31 NOTE — Patient Instructions (Signed)
Medication Instructions:  Your physician recommends that you continue on your current medications as directed. Please refer to the Current Medication list given to you today.  *If you need a refill on your cardiac medications before your next appointment, please call your pharmacy*   Lab Work: TODAY: anemia panel  If you have labs (blood work) drawn today and your tests are completely normal, you will receive your results only by: MyChart Message (if you have MyChart) OR A paper copy in the mail If you have any lab test that is abnormal or we need to change your treatment, we will call you to review the results.   Testing/Procedures: NONE   Follow-Up: At [facility], you and your health needs are our priority.  As part of our continuing mission to provide you with exceptional heart care, we have created designated Provider Care Teams.  These Care Teams include your primary Cardiologist (physician) and Advanced Practice Providers (APPs -  Physician Assistants and Nurse Practitioners) who all work together to provide you with the care you need, when you need it.    Your next appointment:   8-9 month(s)  Provider:   Robin Searing, NP or Eligha Bridegroom, NP

## 2022-07-31 NOTE — Progress Notes (Signed)
Called pt left a message that f/u with MD is as needed.  Call office with any questions.

## 2022-07-31 NOTE — Progress Notes (Signed)
Office Visit    Patient Name: Justin [name] Date of Encounter: 07/31/2022  PCP:  [facility]   [facility]  Cardiologist:  Christell Constant, MD  Advanced Practice Provider:  No care team member to display Electrophysiologist:  None   HPI    Justin [name] is a 57 y.o. male with a past medical history significant for OSA not using CPAP, prior pulmonary embolism, chronic anticoagulation, bipolar seen for HFrEF. 2018; LVEF 40%.  He had stopped drinking for ~ 6+ years.   2023- wosrening HF and MR; saw HF and was considered for valve surgery but had improvement in MR. 2024: maximally titrated on GDMT  Patient notes that he is doing .   Since last visit notes occasional fatigue . His biggest concern is getting his CMR; his spinal stimulator still has not been changed. Recent ED eval for back pain.  No chest pain or pressure .  No SOB/DOE and no PND/Orthopnea.  No weight gain or leg swelling.  No palpitations or syncope.  Notes a bit of fatigue.   Ambulatory blood pressure not done.    Past Medical History    Past Medical History:  Diagnosis Date   Achilles rupture, left    Acute deep vein thrombosis (DVT) of right peroneal vein (HCC) 10/19/2021   Bipolar 1 disorder (HCC)    Dental caries    periodontitis   Pneumonia    Pulmonary embolism (HCC) 05/18/2007   SDH (subdural hematoma) (HCC) 01/04/2013   Sleep apnea    wears CPAP   Subdural hematoma (HCC) 01/04/2013   in setting of supratherapeutic INR   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   Past Surgical History:  Procedure Laterality Date   ACHILLES TENDON SURGERY Left 10/21/2014   Procedure: Left Achilles Reconstruction;  Surgeon: Nadara Mustard, MD;  Location: [facility] OR;  Service: Orthopedics;  Laterality: Left;   APPENDECTOMY     CARDIAC CATHETERIZATION  03/04/2018   [facility]: Normal coronaries, LVEF estimated at 40%, medical Rx   CRANIOTOMY N/A 01/19/2013   Procedure:  CRANIOTOMY HEMATOMA EVACUATION SUBDURAL;  Surgeon: Hewitt Shorts, MD;  Location: [facility] NEURO ORS;  Service: Neurosurgery;  Laterality: N/A;   CYSTECTOMY     right head   ELBOW SURGERY     right   FRACTURE SURGERY     finger   I & D EXTREMITY Left 12/07/2016   Procedure: LEFT ACHILLES DEBRIDEMENT;  Surgeon: Nadara Mustard, MD;  Location: [facility] OR;  Service: Orthopedics;  Laterality: Left;   LUMBAR FUSION  12/23/2017   L5 GILL PROCEDURE, RIGHT L5-S1, TRANSFORAMIAL LUMBAR INTERBODY FUSION, BILATERAL LATERAL FUSION, PEDICLE INSTRUMENTATION   MULTIPLE EXTRACTIONS WITH ALVEOLOPLASTY N/A 03/07/2015   Procedure: MULTIPLE EXTRACTION WITH ALVEOLOPLASTY;  Surgeon: [name], DDS;  Location: [facility] OR;  Service: Oral Surgery;  Laterality: N/A;   RIGHT/LEFT HEART CATH AND CORONARY ANGIOGRAPHY N/A 05/18/2022   Procedure: RIGHT/LEFT HEART CATH AND CORONARY ANGIOGRAPHY;  Surgeon: Laurey Morale, MD;  Location: [facility] INVASIVE CV LAB;  Service: Cardiovascular;  Laterality: N/A;   SPINAL CORD STIMULATOR INSERTION N/A 05/18/2019   Procedure: LUMBAR SPINAL CORD STIMULATOR INSERTION;  Surgeon: Odette Fraction, MD;  Location: [facility] OR;  Service: Neurosurgery;  Laterality: N/A;  Thoracic/Lumbar   SPINAL CORD STIMULATOR INSERTION N/A 04/06/2020   Procedure: Revision of spinal cord stimulator;  Surgeon: Renaldo Fiddler, MD;  Location: [facility] OR;  Service: Neurosurgery;  Laterality: N/A;   TEE WITHOUT CARDIOVERSION N/A 05/18/2022  Procedure: TRANSESOPHAGEAL ECHOCARDIOGRAM (TEE);  Surgeon: Laurey Morale, MD;  Location: [facility] ENDOSCOPY;  Service: Cardiovascular;  Laterality: N/A;    Allergies  Allergies  Allergen Reactions   Lyrica [Pregabalin] Other (See Comments)    Hallucinations      EKGs/Labs/Other Studies Reviewed:   The following studies were reviewed today:  Cardiac Studies & Procedures   CARDIAC CATHETERIZATION  CARDIAC CATHETERIZATION 05/18/2022  Narrative   Mid LAD lesion is 20% stenosed.  1. Mild  nonobstructive CAD. 2. Normal PCWP and RA pressure, mildly elevated LVEDP. 3. Preserved cardiac output.  Well-compensated nonischemic cardiomyopathy.  Will need cardiac MRI when battery is changed in his spinal stimulator to make it MRI compatible.  Findings Coronary Findings Diagnostic  Dominance: Right  Left Main Vessel was injected. Vessel is normal in caliber. Vessel is angiographically normal.  Left Anterior Descending Mid LAD lesion is 20% stenosed.  Left Circumflex Vessel was injected. Vessel is normal in caliber. Vessel is angiographically normal.  Right Coronary Artery Vessel was injected. Vessel is normal in caliber. Vessel is angiographically normal.  Intervention  No interventions have been documented.     ECHOCARDIOGRAM  ECHOCARDIOGRAM COMPLETE 03/29/2022  Narrative ECHOCARDIOGRAM REPORT    Patient Name:   Justin [name] Date of Exam: 03/29/2022 Medical Rec #:  [id number]      Height:       69.0 in Accession #:    [id number]     Weight:       220.6 lb Date of Birth:  1965/03/28      BSA:          2.154 m Patient Age:    56 years       BP:           137/99 mmHg Patient Gender: M              HR:           105 bpm. Exam Location:  [address]  Procedure: 2D Echo, 3D Echo, Cardiac Doppler and Color Doppler  Indications:    R07.9 Chest Pain  History:        Patient has prior history of Echocardiogram examinations, most recent 03/26/2016. Pulmonary embolism, DVT, Arrythmias:Sinus tachycardia, Signs/Symptoms:Dyspnea; Risk Factors:Sleep Apnea.  Sonographer:    Clearence Ped RCS Referring Phys: [phone] [facility]  IMPRESSIONS   1. Left ventricular ejection fraction, by estimation, is 20 to 25%. The left ventricle has severely decreased function. The left ventricle demonstrates global hypokinesis. The left ventricular internal cavity size was severely dilated. Indeterminate diastolic filling due to E-A fusion. 2. Right ventricular systolic function is  normal. The right ventricular size is normal. There is normal pulmonary artery systolic pressure. 3. Left atrial size was severely dilated. 4. The mitral valve is abnormal in structure. Significant functional MR related to MV teathering from LV dilatation and LV dysfunction. Moderate to severe mitral valve regurgitation. No evidence of mitral stenosis. MR EROA 0.25cm2, MR vol 33cc, MR radius 0.8cm 5. The aortic valve is tricuspid. Aortic valve regurgitation is mild. Aortic valve sclerosis is present, with no evidence of aortic valve stenosis. 6. The inferior vena cava is normal in size with greater than 50% respiratory variability, suggesting right atrial pressure of 3 mmHg. 7. Consider TEE for further assessment of degree of MR.  FINDINGS Left Ventricle: Left ventricular ejection fraction, by estimation, is 20 to 25%. The left ventricle has severely decreased function. The left ventricle demonstrates global hypokinesis. 3D left ventricular ejection fraction  analysis performed but not reported based on interpreter judgement due to suboptimal tracking. The left ventricular internal cavity size was severely dilated. There is no left ventricular hypertrophy. Indeterminate diastolic filling due to E-A fusion. Normal left ventricular filling pressure.  Right Ventricle: The right ventricular size is normal. No increase in right ventricular wall thickness. Right ventricular systolic function is normal. There is normal pulmonary artery systolic pressure. The tricuspid regurgitant velocity is 1.68 m/s, and with an assumed right atrial pressure of 3 mmHg, the estimated right ventricular systolic pressure is 14.3 mmHg.  Left Atrium: Left atrial size was severely dilated.  Right Atrium: Right atrial size was normal in size.  Pericardium: There is no evidence of pericardial effusion.  Mitral Valve: The mitral valve is abnormal. Moderate to severe mitral valve regurgitation. No evidence of mitral valve  stenosis.  Tricuspid Valve: The tricuspid valve is normal in structure. Tricuspid valve regurgitation is trivial. No evidence of tricuspid stenosis.  Aortic Valve: The aortic valve is tricuspid. Aortic valve regurgitation is mild. Aortic valve sclerosis is present, with no evidence of aortic valve stenosis.  Pulmonic Valve: The pulmonic valve was normal in structure. Pulmonic valve regurgitation is trivial. No evidence of pulmonic stenosis.  Aorta: The aortic root is normal in size and structure.  Venous: The inferior vena cava is normal in size with greater than 50% respiratory variability, suggesting right atrial pressure of 3 mmHg.  IAS/Shunts: No atrial level shunt detected by color flow Doppler.   LEFT VENTRICLE PLAX 2D LVIDd:         6.70 cm   Diastology LVIDs:         5.90 cm   LV e' medial:    11.10 cm/s LV PW:         1.20 cm   LV E/e' medial:  8.9 LV IVS:        0.60 cm   LV e' lateral:   9.46 cm/s LVOT diam:     2.10 cm   LV E/e' lateral: 10.4 LV SV:         28 LV SV Index:   13 LVOT Area:     3.46 cm  3D Volume EF: 3D EF:        36 % LV EDV:       272 ml LV ESV:       174 ml LV SV:        98 ml  RIGHT VENTRICLE RV Basal diam:  3.30 cm RV S prime:     10.10 cm/s TAPSE (M-mode): 1.7 cm RVSP:           14.3 mmHg  LEFT ATRIUM              Index        RIGHT ATRIUM           Index LA diam:        5.70 cm  2.65 cm/m   RA Pressure: 3.00 mmHg LA Vol (A2C):   102.0 ml 47.36 ml/m  RA Area:     16.00 cm LA Vol (A4C):   104.0 ml 48.28 ml/m  RA Volume:   43.20 ml  20.06 ml/m LA Biplane Vol: 104.0 ml 48.28 ml/m AORTIC VALVE LVOT Vmax:   46.90 cm/s LVOT Vmean:  31.700 cm/s LVOT VTI:    0.080 m  AORTA Ao Root diam: 3.30 cm Ao Asc diam:  3.20 cm  MITRAL VALVE  TRICUSPID VALVE MV Area (PHT):                TR Peak grad:   11.3 mmHg MV Decel Time:                TR Vmax:        168.00 cm/s MR Peak grad:    86.9 mmHg    Estimated RAP:  3.00  mmHg MR Mean grad:    58.0 mmHg    RVSP:           14.3 mmHg MR Vmax:         466.00 cm/s MR Vmean:        351.0 cm/s   SHUNTS MR PISA:         3.53 cm     Systemic VTI:  0.08 m MR PISA Eff ROA: 22 mm       Systemic Diam: 2.10 cm MR PISA Radius:  0.75 cm MV E velocity: 98.50 cm/s MV A velocity: 42.40 cm/s MV E/A ratio:  2.32  Armanda Magic MD Electronically signed by Armanda Magic MD Signature Date/Time: 03/29/2022/5:38:09 PM    Final   TEE  ECHO TEE 05/18/2022  Narrative TRANSESOPHOGEAL ECHO REPORT    Patient Name:   EMMAUS KOVACIC Date of Exam: 05/18/2022 Medical Rec #:  [id number]      Height:       69.0 in Accession #:    [id number]     Weight:       225.0 lb Date of Birth:  1965/06/23      BSA:          2.172 m Patient Age:    56 years       BP:           122/91 mmHg Patient Gender: M              HR:           105 bpm. Exam Location:  Inpatient  Procedure: Transesophageal Echo, Color Doppler and Cardiac Doppler  Indications:     I34.0 Nonrheumatic mitral (valve) insufficiency  History:         Patient has prior history of Echocardiogram examinations, most recent 03/29/2022. Cardiomyopathy.  Sonographer:     Irving Burton Senior RDCS Referring Phys:  Marca Ancona, S Diagnosing Phys: Wilfred Lacy   Sonographer Comments: BP remained 120s systolic throughout procedure   PROCEDURE: After discussion of the risks and benefits of a TEE, an informed consent was obtained from the patient. The transesophogeal probe was passed without difficulty through the esophogus of the patient. Sedation performed by different physician. The patient was monitored while under deep sedation. Anesthestetic sedation was provided intravenously by Anesthesiology: 243mg  of Propofol, 60mg  of Lidocaine. The patient developed no complications during the procedure.  IMPRESSIONS   1. No LV thrombus. Left ventricular ejection fraction, by estimation, is 25 to 30%. The left ventricle has severely  decreased function. The left ventricle demonstrates global hypokinesis. The left ventricular internal cavity size was moderately dilated. 2. Right ventricular systolic function is mildly reduced. The right ventricular size is normal. 3. Left atrial size was moderately dilated. No left atrial/left atrial appendage thrombus was detected. 4. The mitral valve is normal in structure. Mild mitral valve regurgitation, PISA ERO 0.11 cm^2. No systolic flow reversal in the pulmonary vein systolic doppler pattern. No evidence of mitral stenosis. 5. The aortic valve is tricuspid. Aortic valve regurgitation is not visualized. No aortic stenosis is  present. 6. No PFO or ASD by color doppler.  FINDINGS Left Ventricle: No LV thrombus. Left ventricular ejection fraction, by estimation, is 25 to 30%. The left ventricle has severely decreased function. The left ventricle demonstrates global hypokinesis. The left ventricular internal cavity size was moderately dilated.  Right Ventricle: The right ventricular size is normal. No increase in right ventricular wall thickness. Right ventricular systolic function is mildly reduced.  Left Atrium: Left atrial size was moderately dilated. No left atrial/left atrial appendage thrombus was detected.  Right Atrium: Right atrial size was normal in size.  Pericardium: There is no evidence of pericardial effusion.  Mitral Valve: The mitral valve is normal in structure. Mild mitral valve regurgitation. No evidence of mitral valve stenosis.  Tricuspid Valve: The tricuspid valve is normal in structure. Tricuspid valve regurgitation is trivial.  Aortic Valve: The aortic valve is tricuspid. Aortic valve regurgitation is not visualized. No aortic stenosis is present.  Pulmonic Valve: The pulmonic valve was normal in structure. Pulmonic valve regurgitation is not visualized.  Aorta: The aortic root is normal in size and structure.  IAS/Shunts: No PFO or ASD by color  doppler.  Additional Comments: Spectral Doppler performed.   AORTA Ao Root diam: 3.80 cm  MR Peak grad:    53.6 mmHg MR Mean grad:    27.0 mmHg MR Vmax:         366.00 cm/s MR Vmean:        235.0 cm/s MR PISA:         1.01 cm MR PISA Eff ROA: 11 mm MR PISA Radius:  0.40 cm  Dalton McleanMD Electronically signed by Wilfred Lacy Signature Date/Time: 05/18/2022/4:04:28 PM    Final    CT SCANS  CT CORONARY MORPH W/CTA COR W/SCORE 03/02/2022  Addendum 03/02/2022  4:01 PM ADDENDUM REPORT: 03/02/2022 15:59  EXAM: OVER-READ INTERPRETATION  CT CHEST  The following report is a limited chest CT over-read performed by radiologist Dr. Jeronimo Greaves of [facility] on 03/02/2022. This over-read does not include interpretation of cardiac or coronary anatomy or pathology. The coronary CTA interpretation by the cardiologist is attached.  COMPARISON:  10/12/2021 CTA chest  FINDINGS: Vascular: Aortic atherosclerosis. No central pulmonary embolism, on this non-dedicated study. Pulmonary artery enlargement, outflow tract 3.3 cm.  Mediastinum/Nodes: No imaged thoracic adenopathy.  Lungs/Pleura: No pleural fluid. Bibasilar scarring or subsegmental atelectasis.  Posterior right upper and superior segment right lower lobe ground-glass.  Upper Abdomen: Mild hepatic steatosis. Normal imaged portions of the spleen, stomach.  Musculoskeletal: Dorsal spinal stimulator.  IMPRESSION: 1. Mild areas of right upper and superior segment right lower lobe ground-glass. Differential considerations include minimal atypical infection or pulmonary edema (felt less likely given lack of pleural fluid). 2. Pulmonary artery enlargement suggests pulmonary arterial hypertension. 3.  Aortic Atherosclerosis (ICD10-I70.0). 4. Mild hepatic steatosis   Electronically Signed By: Jeronimo Greaves M.D. On: 03/02/2022 15:59  Narrative CLINICAL DATA:  Chest pain  EXAM: Cardiac/Coronary  CTA  TECHNIQUE: A non-contrast, gated CT scan was obtained with axial slices of 3 mm through the heart for calcium scoring. Calcium scoring was performed using the Agatston method. A 120 kV prospective, gated, contrast cardiac scan was obtained. Gantry rotation speed was 250 msecs and collimation was 0.6 mm. Two sublingual nitroglycerin tablets (0.8 mg) were given. The 3D data set was reconstructed in 5% intervals of the 35-75% of the R-R cycle. Diastolic phases were analyzed on a dedicated workstation using MPR, MIP, and VRT modes. The patient  received 95 cc of contrast.  FINDINGS: Image quality: Excellent.  Noise artifact is: Limited.  Coronary Arteries:  Normal coronary origin.  Right dominance.  Left main: The left main is a large caliber vessel with a normal take off from the left coronary cusp that bifurcates to form a left anterior descending artery and a left circumflex artery. There is no plaque or stenosis.  Left anterior descending artery: The proximal LAD is patent. The mid LAD contains mild mixed density plaque (25-49%). The distal LAD is patent. D1 contains minimal calcified plaque (<25%). D2 is patent.  Left circumflex artery: The LCX is non-dominant. The proximal LAD contains minimal calcified plaque (<25%). There is mild calcified plaque (25-49%) in the distal segment. And patent with no evidence of plaque or stenosis. The LCX gives off 3 patent obtuse marginal branches.  Right coronary artery: The RCA is dominant with normal take off from the right coronary cusp. There is minimal non-calcified plaque (<25%). The RCA terminates as a PDA without evidence of plaque or stenosis.  Right Atrium: Right atrial size is dilated.  Right Ventricle: The right ventricular cavity is within normal limits.  Left Atrium: Left atrial size is normal in size with no left atrial appendage filling defect.  Left Ventricle: The ventricular cavity size is severely dilated  (70 mm).  Pulmonary arteries: Dilated pulmonary artery suggestive of pulmonary hypertension.  Pulmonary veins: Normal pulmonary venous drainage.  Pericardium: Normal thickness without significant effusion or calcium present.  Cardiac valves: The aortic valve is trileaflet without significant calcification. The mitral valve is normal without significant calcification.  Aorta: Normal caliber without significant disease.  Extra-cardiac findings: See attached radiology report for non-cardiac structures.  IMPRESSION: 1. Coronary calcium score of 49. This was 80th percentile for age-, sex, and race-matched controls.  2. Mild CAD (25-49%) in the LAD/LCX.  3. Minimal CAD (<25%) in the RCA.  4. Severely dilated left ventricle up to 70 mm. Echocardiogram recommended.  5. Dilated pulmonary artery suggestive of pulmonary hypertension.  RECOMMENDATIONS: 1. Mild non-obstructive CAD (25-49%). Consider non-atherosclerotic causes of chest pain. Consider preventive therapy and risk factor modification.  Lennie Odor, MD  Electronically Signed: By: Lennie Odor M.D. On: 03/02/2022 12:34           Recent Labs: 02/26/2022: NT-Pro BNP 547 07/03/2022: B Natriuretic Peptide 68.0 07/17/2022: ALT 19; BUN 5; Creatinine, Ser 1.38; Hemoglobin 13.2; Magnesium 2.3; Platelets 236; Potassium 4.0; Sodium 140; TSH 0.091  Recent Lipid Panel    Component Value Date/Time   CHOL 220 (H) 10/11/2021 0000   TRIG 224 (H) 10/11/2021 0000   HDL 57 10/11/2021 0000   CHOLHDL 3.9 10/11/2021 0000   LDLCALC 127 (H) 10/11/2021 0000    Home Medications   Current Meds  Medication Sig   carvedilol (COREG) 6.25 MG tablet Take 1 tablet (6.25 mg total) by mouth 2 (two) times daily.   dabigatran (PRADAXA) 150 MG CAPS capsule Take 1 capsule (150 mg total) by mouth 2 (two) times daily.   doxepin (SINEQUAN) 75 MG capsule Take 75 mg by mouth as needed (sleep).   empagliflozin (JARDIANCE) 10 MG TABS tablet  Take 1 tablet (10 mg total) by mouth daily before breakfast.   HYDROcodone-acetaminophen (NORCO) 7.5-325 MG tablet as needed for severe pain.   hydrOXYzine (ATARAX) 25 MG tablet Take 25 mg by mouth 3 (three) times daily as needed for anxiety.   mirtazapine (REMERON) 45 MG tablet Take 45 mg by mouth at bedtime.   pravastatin (PRAVACHOL)  10 MG tablet Take 10 mg by mouth daily.   QUEtiapine (SEROQUEL XR) 400 MG 24 hr tablet Take 800 mg by mouth at bedtime.   sacubitril-valsartan (ENTRESTO) 49-51 MG Take 1 tablet by mouth 2 (two) times daily.   spironolactone (ALDACTONE) 25 MG tablet Take 1 tablet (25 mg total) by mouth daily.   tiZANidine (ZANAFLEX) 4 MG tablet Take 4 mg by mouth as needed for muscle spasms.     Physical Exam    VS:  BP 92/63   Pulse 70   Ht 5\' 9"  (1.753 m)   Wt 214 lb (97.1 kg)   SpO2 98%   BMI 31.60 kg/m  , BMI Body mass index is 31.6 kg/m.  Wt Readings from Last 3 Encounters:  07/31/22 214 lb (97.1 kg)  07/26/22 217 lb (98.4 kg)  07/16/22 215 lb (97.5 kg)     GEN: Well nourished, well developed, in no acute distress. HEENT: normal. Neck: Supple, no JVD Cardiac: RRR, no murmurs, rubs, or gallops. No clubbing, cyanosis, edema.  Radials/PT 2+ and equal bilaterally.  Respiratory:  Respirations regular and unlabored, rhonchi right lower lobe GI: Soft, nontender, nondistended. MS: No deformity or atrophy. Skin: Warm and dry, no rash. Neuro:  Strength and sensation are intact. Psych: Normal affect.  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) - chronic   - NYHA class III, Stage C-D, euvolemic, etiology from alcohol (remote) may have been a contributor; etiology at this time is unclear - Diuretic regimen: Lasix 40 mg as a PRN  - Coreg 6.25 mg PO BID  - ARNI 49/51 BID - SGLT2i continued - aldactone 25 mg - AMB BP check advised; may need to decrease coreg and start succinate if fatigue persists - will check  for IDA - CMR is pending his spinal stimulator surgery - he is seeing AHF   Pulmonary embolism/Hx of DVT -on chronic Pradaxa (prior hematoma in the past  OSA - reviewed his PA dilation and recommend CPAP   Winter f/u unless seeing AHF  Riley Lam, MD FASE [facility] Cardiologist [facility]  [address] [place], [address] [place], [place] [address] [phone]  10:41 AM

## 2022-08-01 LAB — ANEMIA PANEL
Hematocrit: 36.5 % — ABNORMAL LOW (ref 37.5–51.0)
Iron Saturation: 27 % (ref 15–55)
Vitamin B-12: 200 pg/mL — ABNORMAL LOW (ref 232–1245)

## 2022-08-02 LAB — ANEMIA PANEL
Iron: 73 ug/dL (ref 38–169)
Total Iron Binding Capacity: 271 ug/dL (ref 250–450)

## 2022-08-06 ENCOUNTER — Ambulatory Visit (HOSPITAL_COMMUNITY)
Admission: RE | Admit: 2022-08-06 | Discharge: 2022-08-06 | Disposition: A | Discharge status: Home / Self Care | Payer: 59 | Source: Ambulatory Visit | Attending: Adult Health | Admitting: Adult Health

## 2022-08-06 ENCOUNTER — Encounter (HOSPITAL_COMMUNITY): Payer: Self-pay

## 2022-08-06 ENCOUNTER — Other Ambulatory Visit (HOSPITAL_COMMUNITY): Payer: Self-pay

## 2022-08-06 ENCOUNTER — Other Ambulatory Visit (HOSPITAL_COMMUNITY): Payer: Self-pay | Admitting: Pharmacist

## 2022-08-06 ENCOUNTER — Ambulatory Visit (HOSPITAL_BASED_OUTPATIENT_CLINIC_OR_DEPARTMENT_OTHER)
Admission: RE | Admit: 2022-08-06 | Discharge: 2022-08-06 | Disposition: A | Discharge status: Home / Self Care | Payer: 59 | Source: Ambulatory Visit | Attending: Adult Health | Admitting: Adult Health

## 2022-08-06 VITALS — BP 106/80 | HR 82 | Wt 214.6 lb

## 2022-08-06 DIAGNOSIS — Z86711 Personal history of pulmonary embolism: Secondary | ICD-10-CM | POA: Diagnosis not present

## 2022-08-06 DIAGNOSIS — Z7984 Long term (current) use of oral hypoglycemic drugs: Secondary | ICD-10-CM | POA: Insufficient documentation

## 2022-08-06 DIAGNOSIS — I428 Other cardiomyopathies: Secondary | ICD-10-CM | POA: Insufficient documentation

## 2022-08-06 DIAGNOSIS — I10 Essential (primary) hypertension: Secondary | ICD-10-CM | POA: Diagnosis not present

## 2022-08-06 DIAGNOSIS — I5022 Chronic systolic (congestive) heart failure: Secondary | ICD-10-CM | POA: Insufficient documentation

## 2022-08-06 DIAGNOSIS — S7012XA Contusion of left thigh, initial encounter: Secondary | ICD-10-CM | POA: Insufficient documentation

## 2022-08-06 DIAGNOSIS — I2510 Atherosclerotic heart disease of native coronary artery without angina pectoris: Secondary | ICD-10-CM | POA: Insufficient documentation

## 2022-08-06 DIAGNOSIS — Z7902 Long term (current) use of antithrombotics/antiplatelets: Secondary | ICD-10-CM | POA: Diagnosis not present

## 2022-08-06 DIAGNOSIS — I110 Hypertensive heart disease with heart failure: Secondary | ICD-10-CM | POA: Diagnosis not present

## 2022-08-06 DIAGNOSIS — Z86718 Personal history of other venous thrombosis and embolism: Secondary | ICD-10-CM | POA: Diagnosis not present

## 2022-08-06 DIAGNOSIS — I340 Nonrheumatic mitral (valve) insufficiency: Secondary | ICD-10-CM | POA: Insufficient documentation

## 2022-08-06 DIAGNOSIS — M79604 Pain in right leg: Secondary | ICD-10-CM | POA: Diagnosis not present

## 2022-08-06 DIAGNOSIS — Z79899 Other long term (current) drug therapy: Secondary | ICD-10-CM | POA: Diagnosis not present

## 2022-08-06 DIAGNOSIS — M7989 Other specified soft tissue disorders: Secondary | ICD-10-CM | POA: Diagnosis not present

## 2022-08-06 MED ORDER — DABIGATRAN ETEXILATE MESYLATE 150 MG PO CAPS
150.0000 mg | ORAL_CAPSULE | Freq: Two times a day (BID) | ORAL | 0 refills | Status: DC
  Filled 2022-08-06: qty 60, 30d supply, fill #0

## 2022-08-06 NOTE — Patient Instructions (Addendum)
Thank you for coming in today  If you had labs drawn today, any labs that are abnormal the clinic will call you No news is good news  You went for a ultrasound of your right leg today for right leg pain  Medications: No changes  Follow up appointments:  Your physician recommends that you schedule a follow-up appointment in:  1 month in clinic   Do the following things EVERYDAY: Weigh yourself in the morning before breakfast. Write it down and keep it in a log. Take your medicines as prescribed Eat low salt foods--Limit salt (sodium) to 2000 mg per day.  Stay as active as you can everyday Limit all fluids for the day to less than 2 liters   At the [facility], you and your health needs are our priority. As part of our continuing mission to provide you with exceptional heart care, we have created designated Provider Care Teams. These Care Teams include your primary Cardiologist (physician) and Advanced Practice Providers (APPs- Physician Assistants and Nurse Practitioners) who all work together to provide you with the care you need, when you need it.   You may see any of the following providers on your designated Care Team at your next follow up: Dr Arvilla Meres Dr Marca Ancona Dr. Marcos Eke, NP Robbie Lis, [facility] [place], [place] Brynda Peon, NP Karle Plumber, PharmD   Please be sure to bring in all your medications bottles to every appointment.    Thank you for choosing [facility]  If you have any questions or concerns before your next appointment please send [place] a message through [place] or call our office at [phone].    TO LEAVE A MESSAGE FOR THE NURSE SELECT OPTION 2, PLEASE LEAVE A MESSAGE INCLUDING: YOUR NAME DATE OF BIRTH CALL BACK NUMBER REASON FOR CALL**this is important as we prioritize the call backs  YOU WILL RECEIVE A CALL BACK THE SAME DAY AS LONG AS  YOU CALL BEFORE 4:00 PM

## 2022-08-06 NOTE — Progress Notes (Signed)
PCP: [facility] Cardiology: Dr. Katrinka Blazing HF Cardiology: Dr. Shirlee Latch  Justin [name] is a 57 y.o. with history of PE/DVT, bipolar disorder, and chronic systolic CHF was referred by Dr. Katrinka Blazing for evaluation of CHF. Patient has been on anticoagulation long-term for his h/o PE/DVT.  He does not miss doses.  Suspected clotting disorder, being worked up by hematology for possible antiphospholipid antibody syndrome.  He had initial PE in 2009, Eliquis started.  He had DVT on left while on Eliquis and switched to coumadin. In 9/23, he had a spontaneous left thigh hematoma while INR supratherapeutic.  Warfarin was held and eventually switched to Pradaxa   Due to progressive dyspnea over the last few months, echo was done.  This showed EF 20-25%, severe LV dilation, normal RV, moderate-severe Justin. Patient had had coronary CTA in 12/23 with calcium score 49 and mild disease noted in LAD.    TEE in 3/24 showed EF 25-30%, moderate LV dilation, mildly decreased RV function, only mild Justin.  LHC/RHC in 3/24 showed mild nonobstructive CAD, normal filling pressures and preserved cardiac output.   Admitted 07/18/22 with tremors. Saw Pyschiatry. Lithium was stopped.   Today he returns for HF follow up with his son.  Complaining of right lower extremity pain similar to pain from previous DVT.  RLE pain started Saturday. He ran out of Pradaxa Saturday. Denies SOB/PND/Orthopnea. Appetite ok. No fever or chills. Weight at home stable. Taking all medications.  Says his pharmacy wont have any Pradaxa until Wednesday. Followed by HF Paramedicine.   Labs (12/23): Pro-BNP 547 Labs (2/24): K 4, creatinine 1.16, BNP 89 Labs (07/17/22): K 4 Creatinine 1.38   PMH: 1. HTN 2. Sleep study in 1/24 did not show significant OSA.  3. H/o DVT and PE: PE 2009 with Eliquis started, DVT on left in 7/23 while on Eliquis, switched to warfarin.  Had large left thigh intramuscular hematoma with supratherapeutic INR.  Now on Pradaxa.  - Venous  dopplers in 2/24 showed no DVT 4. Bipolar disorder: on Li.  5. H/o subdural hematoma 10/14 6. Chronic systolic CHF: Echo (1/24) with EF 20-25%, severe LV dilation, normal RV, moderate-severe Justin.  - Coronary CTA (12/23): Calcium score 49, mild stenosis in LAD.  - TEE (3/24): EF 25-30%, moderate LV dilation, mildly decreased RV function, only mild Justin. - LHC/RHC (3/24): Mild nonobstructive CAD, mean RA 2, PAP 35/17, mean PCWP 12, CI 2.69 7. Spontaneous large left thigh intramuscular hematoma in setting of supratherapeutic INR on warfarin.  8. ABIs (2/24): normal  FH: No cardiac problems that he knows of.   Social History   Socioeconomic History   Marital status: Single    Spouse name: Not on file   Number of children: Not on file   Years of education: Not on file   Highest education level: Not on file  Occupational History   Occupation: disability  Tobacco Use   Smoking status: Never   Smokeless tobacco: Never  Vaping Use   Vaping Use: Never used  Substance and Sexual Activity   Alcohol use: No   Drug use: No   Sexual activity: Not on file  Other Topics Concern   Not on file  Social History Narrative   FROM [place]    Lives in a 2 story apartment.  His cousin is currently staying with him.  Has 4 children.  On disability for the past 10 years for pulmonary embolism, bipolar disease.  Education: high school.   Social Determinants of  Health   Financial Resource Strain: Not on file  Food Insecurity: No Food Insecurity (07/17/2022)   Hunger Vital Sign    Worried About Running Out of Food in the Last Year: Never true    Ran Out of Food in the Last Year: Never true  Transportation Needs: No Transportation Needs (07/17/2022)   PRAPARE - Administrator, Civil Service (Medical): No    Lack of Transportation (Non-Medical): No  Physical Activity: Not on file  Stress: Not on file  Social Connections: Not on file  Intimate Partner Violence: Not At Risk (07/17/2022)    Humiliation, Afraid, Rape, and Kick questionnaire    Fear of Current or Ex-Partner: No    Emotionally Abused: No    Physically Abused: No    Sexually Abused: No   ROS: All systems reviewed and negative except as per HPI.   Current Outpatient Medications  Medication Sig Dispense Refill   carvedilol (COREG) 6.25 MG tablet Take 1 tablet (6.25 mg total) by mouth 2 (two) times daily. 60 tablet 11   doxepin (SINEQUAN) 75 MG capsule Take 75 mg by mouth as needed (sleep).     empagliflozin (JARDIANCE) 10 MG TABS tablet Take 1 tablet (10 mg total) by mouth daily before breakfast. 30 tablet 11   HYDROcodone-acetaminophen (NORCO) 7.5-325 MG tablet as needed for severe pain.     hydrOXYzine (ATARAX) 25 MG tablet Take 25 mg by mouth 3 (three) times daily as needed for anxiety.     mirtazapine (REMERON) 45 MG tablet Take 45 mg by mouth at bedtime.     pravastatin (PRAVACHOL) 10 MG tablet Take 10 mg by mouth daily.     QUEtiapine (SEROQUEL XR) 400 MG 24 hr tablet Take 800 mg by mouth at bedtime.     sacubitril-valsartan (ENTRESTO) 49-51 MG Take 1 tablet by mouth 2 (two) times daily. 60 tablet 0   spironolactone (ALDACTONE) 25 MG tablet Take 1 tablet (25 mg total) by mouth daily. 30 tablet 11   tiZANidine (ZANAFLEX) 4 MG tablet Take 4 mg by mouth as needed for muscle spasms.     dabigatran (PRADAXA) 150 MG CAPS capsule Take 1 capsule (150 mg total) by mouth 2 (two) times daily. (Patient not taking: Reported on 08/06/2022) 180 capsule 0   No current facility-administered medications for this encounter.   BP 106/80   Pulse 82   Wt 97.3 kg (214 lb 9.6 oz)   SpO2 97%   BMI 31.69 kg/m  Wt Readings from Last 3 Encounters:  08/06/22 97.3 kg (214 lb 9.6 oz)  07/31/22 97.1 kg (214 lb)  07/26/22 98.4 kg (217 lb)    General:  Walked in the clinic. No resp difficulty HEENT: normal Neck: supple. no JVD. Carotids 2+ bilat; no bruits. No lymphadenopathy or thryomegaly appreciated. Cor: PMI nondisplaced.  Regular rate & rhythm. No rubs, gallops or murmurs. Lungs: clear Abdomen: soft, nontender, nondistended. No hepatosplenomegaly. No bruits or masses. Good bowel sounds. Extremities: no cyanosis, clubbing, rash, RLE  painful to touch. Neuro: alert & orientedx3, cranial nerves grossly intact. moves all 4 extremities w/o difficulty. Affect pleasant  EKG : SR 87 bpm   Assessment/plan: 1. Venous thromboembolism: PE in 2009, DVT in 7/23 while on Eliquis.  Switched to warfarin and had spontaneous left thigh hematoma while INR supratherapeutic on warfarin.  Now on Pradaxa.  Has been out pradaxa for the last 2 days. Pharmacy wont have any until Wednesday  - Sent for stat RLE ultrasound.  -  Continue Pradaxa.  2. Chronic systolic CHF: Nonischemic cardiomyopathy.  Echo (1/24) with EF 20-25%, severe LV dilation, normal RV, moderate-severe Justin. TEE in 3/24 showed EF 25-30%, moderate LV dilation, mildly decreased RV function, only mild Justin.  LHC/RHC in 3/24 with mild nonobstructive CAD, normal filling pressures, preserved cardiac output.   Marland Kitchen He denies history of ETOH or drugs, no family history of cardiomyopathy or SCD.  He has a history of HTN. - NYHA II. Volume status stable. He does not need lasix.  - No med titration. BP soft.  - Continue Entresto 49/51 bid.   - Continue jardiance  10 mg daily.  -Continue spironolactone 25 mg  daily  - Continue coreg 6. 25 mg bid.  - We will arrange for cardiac MRI after his spinal stimulator battery is changed and device is thus MRI compatible.  3. Mitral regurgitation: Moderate-severe on 1/24 echo.  However, TEE in 3/24 showed only mild Justin.  4. HTN: BP controlled.    Sent for stat lower extremity ultra sound.   Follow up in 1 month.   Ashlie Mcmenamy NP-C  08/06/2022

## 2022-08-06 NOTE — Progress Notes (Signed)
Paramedicine Encounter   Patient ID: Justin [name] , male,   DOB: July 11, 1965,56 y.o.,  MRN: [id number]   Met patient in clinic today with provider.  Weight @ clinic-214 B/P-106/80  P-82 [id number]  When I was arriving his v/s had been taken but b/c of his complaint of his leg pain amy is sending him to [place] stat.  Pt has been out of his pradaxa for 2 days and has been having severe leg pains just like his last DVT.  Visit was not really done due to this and amy ordered his stat US so he was taken to [place].   He has been out of his pradaxa b/c pharmacy does not have it and can't get it until Wednesday.   [facility] and they do not have it in stock.   Amy thinks the [facility] pharmacy has it and will get it to him after his [place] is done.  Will f/u next week.  -[place] results negative-   Kerry Hough, EMT-Paramedic [phone] 08/06/2022

## 2022-08-07 ENCOUNTER — Other Ambulatory Visit (HOSPITAL_COMMUNITY): Payer: Self-pay

## 2022-08-08 ENCOUNTER — Telehealth (HOSPITAL_COMMUNITY): Payer: Self-pay | Admitting: Licensed Clinical Social Worker

## 2022-08-08 NOTE — Telephone Encounter (Signed)
HF Paramedicine Team Based Care Meeting  One Month Post Enrollment Assessment  HF MD- NA  HF NP - Amy Clegg NP-C   [facility] HF Paramedicine  Katie Vicente Males  [facility] admit within the last 30 days for heart failure? No heart failure related admissions  Medications concerns? Ran out of medication and didn't take for several days. Patient takes medications out of the bottle and appears to have good understanding of them at this time.  Transportation issues? Has a car but also uses the bus system.  Education needs? none  SDOH concerns? none  [facility] Referrals Placed: none needed at this time  Barriers to discharge? New referral still making sure he is stable on medication regimen to avoid readmission.     Burna Sis, LCSW Clinical Social Worker [facility] Desk#: [phone] Cell#: [phone]

## 2022-08-13 ENCOUNTER — Encounter (HOSPITAL_BASED_OUTPATIENT_CLINIC_OR_DEPARTMENT_OTHER): Payer: Self-pay | Admitting: Emergency Medicine

## 2022-08-13 ENCOUNTER — Other Ambulatory Visit: Payer: Self-pay

## 2022-08-13 ENCOUNTER — Emergency Department (HOSPITAL_BASED_OUTPATIENT_CLINIC_OR_DEPARTMENT_OTHER)
Admission: EM | Admit: 2022-08-13 | Discharge: 2022-08-13 | Disposition: A | Discharge status: Home / Self Care | Payer: 59 | Attending: Emergency Medicine | Admitting: Emergency Medicine

## 2022-08-13 DIAGNOSIS — I509 Heart failure, unspecified: Secondary | ICD-10-CM | POA: Diagnosis not present

## 2022-08-13 DIAGNOSIS — M545 Low back pain, unspecified: Secondary | ICD-10-CM | POA: Diagnosis present

## 2022-08-13 DIAGNOSIS — M79604 Pain in right leg: Secondary | ICD-10-CM | POA: Insufficient documentation

## 2022-08-13 MED ORDER — PREDNISONE 20 MG PO TABS: ORAL_TABLET | ORAL | 0 refills | Status: DC

## 2022-08-13 MED ORDER — METHOCARBAMOL 500 MG PO TABS: 1000.0000 mg | ORAL_TABLET | Freq: Three times a day (TID) | ORAL | 0 refills | Status: DC | PRN

## 2022-08-13 NOTE — ED Triage Notes (Signed)
For last 2 days pain starts in back/groin/leg and ankle on right side.

## 2022-08-13 NOTE — ED Provider Notes (Signed)
[place] EMERGENCY DEPARTMENT AT [facility] Provider Note   CSN: [id number] Arrival date & time: 08/13/22  1326     History  Chief Complaint  Patient presents with   Sciatica    Justin [name] is a 57 y.o. male.  Patient with history of congestive heart failure, history of DVT on chronic anticoagulation Pradaxa, chronic pain on hydrocodone chronically, history of lumbar spinal fusion, history of spinal stimulator --presents to the emergency department today for evaluation of right-sided lower back pain with radiation into his leg.  He states that it shoots down his leg behind his knee.  He has not had any foot drop but it has been painful to ambulate.  Symptoms started about 3 days ago.  He states that in general he has been doing better since he has had surgery on his back, however symptoms have now flared up.  They have not been improved with his home medications.  He denies significant swelling of the leg.  He has not fallen. Patient denies warning symptoms of back pain including: fecal incontinence, urinary retention or overflow incontinence, night sweats, waking from sleep with back pain, unexplained fevers or weight loss, h/o cancer, IVDU, recent trauma.  Denies history of diabetes to me.        Home Medications Prior to Admission medications   Medication Sig Start Date End Date Taking? Authorizing Provider  methocarbamol (ROBAXIN) 500 MG tablet Take 2 tablets (1,000 mg total) by mouth every 8 (eight) hours as needed for muscle spasms. 08/13/22  Yes Renne Crigler, PA-C  predniSONE (DELTASONE) 20 MG tablet 3 Tabs PO Days 1-3, then 2 tabs PO Days 4-6, then 1 tab PO Day 7-9, then Half Tab PO Day 10-12 08/13/22  Yes Renne Crigler, PA-C  carvedilol (COREG) 6.25 MG tablet Take 1 tablet (6.25 mg total) by mouth 2 (two) times daily. 06/26/22 06/26/23  Laurey Morale, MD  dabigatran (PRADAXA) 150 MG CAPS capsule Take 1 capsule (150 mg total) by mouth 2 (two) times daily. 08/06/22    Laurey Morale, MD  doxepin (SINEQUAN) 75 MG capsule Take 75 mg by mouth as needed (sleep). 07/24/22   [provider]  empagliflozin (JARDIANCE) 10 MG TABS tablet Take 1 tablet (10 mg total) by mouth daily before breakfast. 06/26/22   Laurey Morale, MD  HYDROcodone-acetaminophen [facility]) 7.5-325 MG tablet as needed for severe pain. 07/24/22   [provider]  hydrOXYzine (ATARAX) 25 MG tablet Take 25 mg by mouth 3 (three) times daily as needed for anxiety. 04/25/22   [provider]  mirtazapine (REMERON) 45 MG tablet Take 45 mg by mouth at bedtime.    [provider]  pravastatin (PRAVACHOL) 10 MG tablet Take 10 mg by mouth daily. 05/12/22   [provider]  QUEtiapine (SEROQUEL XR) 400 MG 24 hr tablet Take 800 mg by mouth at bedtime. 11/16/21   [provider]  sacubitril-valsartan (ENTRESTO) 49-51 MG Take 1 tablet by mouth 2 (two) times daily. 06/26/22   Laurey Morale, MD  spironolactone (ALDACTONE) 25 MG tablet Take 1 tablet (25 mg total) by mouth daily. 06/26/22 06/26/23  Laurey Morale, MD      Allergies    Lyrica [pregabalin]    Review of Systems   Review of Systems  Physical Exam Updated Vital Signs BP 124/87   Pulse 90   Temp 98.9 F (37.2 C)   Resp 14   SpO2 95%   Physical Exam Vitals and nursing  note reviewed.  Constitutional:      Appearance: He is well-developed.  HENT:     Head: Normocephalic and atraumatic.  Eyes:     Conjunctiva/sclera: Conjunctivae normal.  Abdominal:     Palpations: Abdomen is soft.     Tenderness: There is no abdominal tenderness. There is no right CVA tenderness or left CVA tenderness.  Musculoskeletal:        General: Normal range of motion.     Cervical back: Normal range of motion. No spasms or tenderness. Normal range of motion.     Thoracic back: No spasms or tenderness.     Lumbar back: Tenderness present. No bony tenderness.       Back:     Right lower leg: No edema.     Left  lower leg: No edema.     Comments: No step-off noted with palpation of spine.  Patient is able to roll in bed onto his side and flex and extend hips and knees without difficulty and normal strength.  Skin:    General: Skin is warm and dry.  Neurological:     Mental Status: He is alert.     Sensory: No sensory deficit.     Motor: No abnormal muscle tone.     Comments: 5/5 strength in entire lower extremities bilaterally. No sensation deficit.   Psychiatric:        Mood and Affect: Mood normal.     ED Results / Procedures / Treatments   Labs (all labs ordered are listed, but only abnormal results are displayed) Labs Reviewed - No data to display  EKG None  Radiology No results found.  Procedures Procedures    Medications Ordered in ED Medications - No data to display  ED Course/ Medical Decision Making/ A&P    Patient seen and examined. History obtained directly from patient.  I did review patient's most recent cardiology note for background.  Hemoglobin A1c was 6.4% December 2023.  Labs/EKG: None ordered.  Imaging: None ordered. Considering imaging of the lumbar spine with x-ray or CT but no red flags today and feel this would be low yield given his history.  Medications/Fluids: None ordered  Most recent vital signs reviewed and are as follows: BP 124/87   Pulse 90   Temp 98.9 F (37.2 C)   Resp 14   SpO2 95%   Initial impression: Right-sided low back pain with radicular features  Home treatment plan: Patient was counseled on back pain precautions and advised to do activity as tolerated but avoid strenuous activity and do not lift, push, or pull heavy objects more than 10 pounds for the next week.  Patient counseled to use ice or heat on back as needed for pain and spasm.    Medications prescribed: As patient is on long-term anticoagulation, will need to avoid NSAIDs.  Patient has "prediabetes" per records.  Will give trial of prednisone and muscle relaxer.   Robaxin for muscle pain and spasm. Patient counseled on proper use of muscle relaxant medication.  They were requested not to drink alcohol, drive any vehicle, or do any dangerous activities while taking this medication due to potential drowsiness and unintended.  Patient verbalized understanding.  Return instructions discussed with patient: Urged to return with worsening severe pain, loss of bowel or bladder control, trouble walking, development of weakness in the legs, or with any other concerns.  Follow-up instructions discussed with patient: Patient urged to follow-up with PCP if pain does not improve with treatment  and rest or if pain becomes recurrent.                             Medical Decision Making Risk Prescription drug management.   Patient with back pain with radicular features.  He has a history of the same, status post lumbar fusion.  No neurological deficits. Patient is ambulatory. No warning symptoms of back pain including: fecal incontinence, urinary retention or overflow incontinence, night sweats, waking from sleep with back pain, unexplained fevers or weight loss, h/o cancer, IVDU, recent trauma. No concern for cauda equina, epidural abscess, or other serious cause of back pain. Conservative measures such as rest, ice/heat and pain medicine indicated with PCP follow-up if no improvement with conservative management.          Final Clinical Impression(s) / ED Diagnoses Final diagnoses:  Low back pain radiating to right lower extremity    Rx / DC Orders ED Discharge Orders          Ordered    methocarbamol (ROBAXIN) 500 MG tablet  Every 8 hours PRN        08/13/22 1539    predniSONE (DELTASONE) 20 MG tablet        08/13/22 1539              Renne Crigler, PA-C 08/13/22 1551    Tanda Rockers A, DO 08/13/22 1559

## 2022-08-13 NOTE — Discharge Instructions (Signed)
Please read and follow all provided instructions.  Your diagnoses today include:  1. Low back pain radiating to right lower extremity    Tests performed today include: Vital signs - see below for your results today  Medications prescribed:  Robaxin (methocarbamol) - muscle relaxer medication  DO NOT drive or perform any activities that require you to be awake and alert because this medicine can make you drowsy.   Prednisone - steroid medicine   It is best to take this medication in the morning to prevent sleeping problems. If you are diabetic, monitor your blood sugar closely and stop taking Prednisone if blood sugar is over 300. Take with food to prevent stomach upset.   Take any prescribed medications only as directed.  Home care instructions:  Follow any educational materials contained in this packet Please rest, use ice or heat on your back for the next several days Do not lift, push, pull anything more than 10 pounds for the next week  Follow-up instructions: Please follow-up with your primary care provider in the next 1 week for further evaluation of your symptoms.   Return instructions:  SEEK IMMEDIATE MEDICAL ATTENTION IF YOU HAVE: New numbness, tingling, weakness, or problem with the use of your arms or legs Severe back pain not relieved with medications Loss control of your bowels or bladder Increasing pain in any areas of the body (such as chest or abdominal pain) Shortness of breath, dizziness, or fainting.  Worsening nausea (feeling sick to your stomach), vomiting, fever, or sweats Any other emergent concerns regarding your health   Additional Information:  Your vital signs today were: BP 124/87   Pulse 90   Temp 98.9 F (37.2 C)   Resp 14   SpO2 95%  If your blood pressure (BP) was elevated above 135/85 this visit, please have this repeated by your doctor within one month. --------------

## 2022-08-15 ENCOUNTER — Other Ambulatory Visit (HOSPITAL_COMMUNITY): Payer: Self-pay

## 2022-08-15 NOTE — Progress Notes (Signed)
Paramedicine Encounter    Patient ID: Justin [name], male    DOB: February 27, 1966, 57 y.o.   MRN: [id number]   Complaints-went to er for back/leg pain different than the DVT pain   Edema-rt leg some   Compliance with meds-yes   Pill box filled-not used  If so, by whom-n/a  Refills needed-not at this time   All meds affordable at this time.  Reminded him of clinic visit appoint next month.   Pt reports he is doing ok. He had to go to ER the other day for back pain/leg pain but this felt differently than his DVT pains in the past.  He got muscle relaxer and prednisone-just got the prednisone so hasn't started yet due to holiday the other day.   He denies increased sob, no dizziness, no c/p. Appetite ok. Pt denies any bleeding issues.  He reports he feels tired the last 2 days.  He has been back trying to use his CPAP.   He does drive at times, but then he gets friends or someone to take him sometimes.  Pt does not use pill box per preference.   He somehow got [place] insurance and he doesn't know why or how that got there.  He can't see his pain doc until that is cleared from the system. He did contact Child psychotherapist and it showed that he still has medicaid.  This all happened last week-he is in need to see his pain doc asap so I told him to call the office and see if it had been cleared up yet.  Will f/u in a couple wks.   BP (!) 128/92   Pulse 84   Resp 16   Wt 212 lb (96.2 kg)   SpO2 99%   BMI 31.31 kg/m  Weight yesterday-? Last visit weight-214 @ clinic   Patient Care Team: Pa, [facility] as PCP - General (Internal Medicine) Christell Constant, MD as PCP - Cardiology (Cardiology)  Patient Active Problem List   Diagnosis Date Noted   Pain in right leg 08/06/2022   Fatigue 07/31/2022   History of DVT (deep vein thrombosis) 07/17/2022   Tremors of nervous system 07/17/2022   Nonischemic cardiomyopathy (HCC) 05/18/2022   Chronic systolic CHF (congestive heart  failure) (HCC) 05/18/2022   Snoring 04/04/2022   Normocytic anemia 12/22/2021   History of pulmonary embolism 10/19/2021   Status post lumbar spinal fusion 01/03/2018   Lumbar stenosis 12/23/2017   Spondylolisthesis, lumbar region 06/25/2017   Achilles tendinitis of left lower extremity 12/07/2016   Achilles tendinitis, left leg 05/17/2016   Obstructive sleep apnea on CPAP 03/07/2015   Achilles rupture, left 10/21/2014   Bipolar disorder, unspecified (HCC) 01/07/2013   Headache(784.0) 01/07/2013   Chronic anticoagulation 01/04/2013    Current Outpatient Medications:    carvedilol (COREG) 6.25 MG tablet, Take 1 tablet (6.25 mg total) by mouth 2 (two) times daily., Disp: 60 tablet, Rfl: 11   dabigatran (PRADAXA) 150 MG CAPS capsule, Take 1 capsule (150 mg total) by mouth 2 (two) times daily., Disp: 60 capsule, Rfl: 0   doxepin (SINEQUAN) 75 MG capsule, Take 75 mg by mouth as needed (sleep)., Disp: , Rfl:    empagliflozin (JARDIANCE) 10 MG TABS tablet, Take 1 tablet (10 mg total) by mouth daily before breakfast., Disp: 30 tablet, Rfl: 11   hydrOXYzine (ATARAX) 25 MG tablet, Take 25 mg by mouth 3 (three) times daily as needed for anxiety., Disp: , Rfl:    methocarbamol (ROBAXIN)  500 MG tablet, Take 2 tablets (1,000 mg total) by mouth every 8 (eight) hours as needed for muscle spasms., Disp: 30 tablet, Rfl: 0   mirtazapine (REMERON) 45 MG tablet, Take 45 mg by mouth at bedtime., Disp: , Rfl:    pravastatin (PRAVACHOL) 10 MG tablet, Take 10 mg by mouth daily., Disp: , Rfl:    predniSONE (DELTASONE) 20 MG tablet, 3 Tabs PO Days 1-3, then 2 tabs PO Days 4-6, then 1 tab PO Day 7-9, then Half Tab PO Day 10-12, Disp: 20 tablet, Rfl: 0   QUEtiapine (SEROQUEL XR) 400 MG 24 hr tablet, Take 800 mg by mouth at bedtime., Disp: , Rfl:    sacubitril-valsartan (ENTRESTO) 49-51 MG, Take 1 tablet by mouth 2 (two) times daily., Disp: 60 tablet, Rfl: 0   spironolactone (ALDACTONE) 25 MG tablet, Take 1 tablet (25  mg total) by mouth daily., Disp: 30 tablet, Rfl: 11   HYDROcodone-acetaminophen (NORCO) 7.5-325 MG tablet, as needed for severe pain., Disp: , Rfl:  Allergies  Allergen Reactions   Lyrica [Pregabalin] Other (See Comments)    Hallucinations      Social History   Socioeconomic History   Marital status: Single    Spouse name: Not on file   Number of children: Not on file   Years of education: Not on file   Highest education level: Not on file  Occupational History   Occupation: disability  Tobacco Use   Smoking status: Never   Smokeless tobacco: Never  Vaping Use   Vaping Use: Never used  Substance and Sexual Activity   Alcohol use: No   Drug use: No   Sexual activity: Not on file  Other Topics Concern   Not on file  Social History Narrative   FROM [place], [place]    Lives in a 2 story apartment.  His cousin is currently staying with him.  Has 4 children.  On disability for the past 10 years for pulmonary embolism, bipolar disease.  Education: high school.   Social Determinants of Health   Financial Resource Strain: Not on file  Food Insecurity: No Food Insecurity (07/17/2022)   Hunger Vital Sign    Worried About Running Out of Food in the Last Year: Never true    Ran Out of Food in the Last Year: Never true  Transportation Needs: No Transportation Needs (07/17/2022)   PRAPARE - Administrator, Civil Service (Medical): No    Lack of Transportation (Non-Medical): No  Physical Activity: Not on file  Stress: Not on file  Social Connections: Not on file  Intimate Partner Violence: Not At Risk (07/17/2022)   Humiliation, Afraid, Rape, and Kick questionnaire    Fear of Current or Ex-Partner: No    Emotionally Abused: No    Physically Abused: No    Sexually Abused: No    Physical Exam      Future Appointments  Date Time Provider Department Center  09/06/2022  2:00 PM [facility] PA/NP [facility] None  10/19/2022  2:00 PM [facility] None  10/19/2022   2:40 PM Jaci Standard, MD [facility] None       Kerry Hough, Paramedic [phone] [facility] Paramedic  08/15/22

## 2022-08-29 ENCOUNTER — Telehealth: Payer: Self-pay

## 2022-08-29 MED ORDER — VITAMIN B-12 100 MCG PO TABS: 100.0000 ug | ORAL_TABLET | Freq: Every day | ORAL | 3 refills | Status: DC

## 2022-08-29 NOTE — Telephone Encounter (Signed)
The patient has been notified of the result and verbalized understanding.  All questions (if any) were answered. Macie Burows, RN 08/29/2022 10:43 AM   Result sent to PCP via Epic routing.

## 2022-08-29 NOTE — Telephone Encounter (Signed)
-----   Message from Christell Constant, MD sent at 08/05/2022  4:25 PM EDT ----- Anemia with low B12 100 mcg daily Send to PCP, may need IM injection

## 2022-09-04 NOTE — Progress Notes (Addendum)
PCP: Pa, [facility] Cardiology: Dr. Katrinka Blazing HF Cardiology: Dr. Shirlee Latch  Justin [name] is a 57 y.o. with history of PE/DVT, bipolar disorder, and chronic systolic CHF was referred by Dr. Katrinka Blazing for evaluation of CHF. Patient has been on anticoagulation long-term for his h/o PE/DVT.  He does not miss doses.  Suspected clotting disorder, being worked up by hematology for possible antiphospholipid antibody syndrome.  He had initial PE in 2009, Eliquis started.  He had DVT on left while on Eliquis and switched to coumadin. In 9/23, he had a spontaneous left thigh hematoma while INR supratherapeutic.  Warfarin was held and eventually switched to Pradaxa   Due to progressive dyspnea over the last few months, echo was done.  This showed EF 20-25%, severe LV dilation, normal RV, moderate-severe Justin. Patient had had coronary CTA in 12/23 with calcium score 49 and mild disease noted in LAD.    TEE in 3/24 showed EF 25-30%, moderate LV dilation, mildly decreased RV function, only mild Justin.  LHC/RHC in 3/24 showed mild nonobstructive CAD, normal filling pressures and preserved cardiac output.   Admitted 07/18/22 with tremors. Saw Pyschiatry. Lithium was stopped.   Follow up 5/24, had LE pain and off Pradaxa x few days. Concerned for DVT and arranged for urgent dopplers, which showed no DVT.   Today he returns for HF follow up. Overall feeling fine. He has been fatigued recently. He has some ankle swelling. He has dyspnea walking further distances on flat ground. He has rare atypical chest pain, not with exertion. Denies palpitations, dizziness, abnormal bleeding or PND/Orthopnea. Appetite ok. No fever or chills. Weight at home 210 pounds. Taking all medications. Followed by HF Paramedicine. He has follow up next week with Neurosurgery regarding his spinal cord stimulator.   REDs: 37%  ECG (personally reviewed): NSR 78 bpm, QTc 510 msec  Labs (12/23): Pro-BNP 547 Labs (2/24): K 4, creatinine 1.16, BNP 89 Labs  (4/24): K 4, creatinine 1.38   PMH: 1. HTN 2. Sleep study in 1/24 did not show significant OSA.  3. H/o DVT and PE: PE 2009 with Eliquis started, DVT on left in 7/23 while on Eliquis, switched to warfarin.  Had large left thigh intramuscular hematoma with supratherapeutic INR.  Now on Pradaxa.  - Venous dopplers in 2/24 showed no DVT 4. Bipolar disorder: previously on Li.  5. H/o subdural hematoma 10/14 6. Chronic systolic CHF: Echo (1/24) with EF 20-25%, severe LV dilation, normal RV, moderate-severe Justin.  - Coronary CTA (12/23): Calcium score 49, mild stenosis in LAD.  - TEE (3/24): EF 25-30%, moderate LV dilation, mildly decreased RV function, only mild Justin. - LHC/RHC (3/24): Mild nonobstructive CAD, mean RA 2, PAP 35/17, mean PCWP 12, CI 2.69 7. Spontaneous large left thigh intramuscular hematoma in setting of supratherapeutic INR on warfarin.  8. ABIs (2/24): normal  FH: No cardiac problems that he knows of.   Social History   Socioeconomic History   Marital status: Single    Spouse name: Not on file   Number of children: Not on file   Years of education: Not on file   Highest education level: Not on file  Occupational History   Occupation: disability  Tobacco Use   Smoking status: Never   Smokeless tobacco: Never  Vaping Use   Vaping Use: Never used  Substance and Sexual Activity   Alcohol use: No   Drug use: No   Sexual activity: Not on file  Other Topics Concern   Not on  file  Social History Narrative   FROM [place], [place]    Lives in a 2 story apartment.  His cousin is currently staying with him.  Has 4 children.  On disability for the past 10 years for pulmonary embolism, bipolar disease.  Education: high school.   Social Determinants of Health   Financial Resource Strain: Not on file  Food Insecurity: No Food Insecurity (07/17/2022)   Hunger Vital Sign    Worried About Running Out of Food in the Last Year: Never true    Ran Out of Food in the Last Year: Never  true  Transportation Needs: No Transportation Needs (07/17/2022)   PRAPARE - Administrator, Civil Service (Medical): No    Lack of Transportation (Non-Medical): No  Physical Activity: Not on file  Stress: Not on file  Social Connections: Not on file  Intimate Partner Violence: Not At Risk (07/17/2022)   Humiliation, Afraid, Rape, and Kick questionnaire    Fear of Current or Ex-Partner: No    Emotionally Abused: No    Physically Abused: No    Sexually Abused: No   ROS: All systems reviewed and negative except as per HPI.   Current Outpatient Medications  Medication Sig Dispense Refill   carvedilol (COREG) 6.25 MG tablet Take 1 tablet (6.25 mg total) by mouth 2 (two) times daily. 60 tablet 11   dabigatran (PRADAXA) 150 MG CAPS capsule Take 1 capsule (150 mg total) by mouth 2 (two) times daily. 60 capsule 0   doxepin (SINEQUAN) 75 MG capsule Take 75 mg by mouth as needed (sleep).     empagliflozin (JARDIANCE) 10 MG TABS tablet Take 1 tablet (10 mg total) by mouth daily before breakfast. 30 tablet 11   HYDROcodone-acetaminophen (NORCO) 7.5-325 MG tablet as needed for severe pain.     hydrOXYzine (ATARAX) 25 MG tablet Take 25 mg by mouth 3 (three) times daily as needed for anxiety.     methocarbamol (ROBAXIN) 500 MG tablet Take 2 tablets (1,000 mg total) by mouth every 8 (eight) hours as needed for muscle spasms. 30 tablet 0   mirtazapine (REMERON) 45 MG tablet Take 45 mg by mouth at bedtime.     QUEtiapine (SEROQUEL XR) 400 MG 24 hr tablet Take 800 mg by mouth at bedtime.     sacubitril-valsartan (ENTRESTO) 49-51 MG Take 1 tablet by mouth 2 (two) times daily. 60 tablet 0   spironolactone (ALDACTONE) 25 MG tablet Take 1 tablet (25 mg total) by mouth daily. 30 tablet 11   vitamin B-12 (CYANOCOBALAMIN) 100 MCG tablet Take 1 tablet (100 mcg total) by mouth daily. 90 tablet 3   No current facility-administered medications for this encounter.   BP (!) 130/90   Pulse 80   Wt 97.3  kg (214 lb 6.4 oz)   SpO2 97%   BMI 31.66 kg/m   Wt Readings from Last 3 Encounters:  09/05/22 97.3 kg (214 lb 6.4 oz)  08/15/22 96.2 kg (212 lb)  08/06/22 97.3 kg (214 lb 9.6 oz)   Physical Exam General:  NAD. No resp difficulty, walked into clinic HEENT: Normal Neck: Supple. No JVD. Carotids 2+ bilat; no bruits. No lymphadenopathy or thryomegaly appreciated. Cor: PMI nondisplaced. Regular rate & rhythm. No rubs, gallops or murmurs. TTP to mid-sternum Lungs: Clear Abdomen: Soft, nontender, nondistended. No hepatosplenomegaly. No bruits or masses. Good bowel sounds. Extremities: No cyanosis, clubbing, rash, trace pedal edema Neuro: Alert & oriented x 3, cranial nerves grossly intact. Moves all 4 extremities  w/o difficulty. Affect pleasant.  Assessment/plan: 1. Venous thromboembolism: PE in 2009, DVT in 7/23 while on Eliquis.  Switched to warfarin and had spontaneous left thigh hematoma while INR supratherapeutic on warfarin. LE dopplers (4/24) negative for DVT. Now on Pradaxa.   - Continue Pradaxa.  2. Chronic systolic CHF: Nonischemic cardiomyopathy.  Echo (1/24) with EF 20-25%, severe LV dilation, normal RV, moderate-severe Justin. TEE in 3/24 showed EF 25-30%, moderate LV dilation, mildly decreased RV function, only mild Justin.  LHC/RHC in 3/24 with mild nonobstructive CAD, normal filling pressures, preserved cardiac output.  He denies history of ETOH or drugs, no family history of cardiomyopathy or SCD.  He has a history of HTN. NYHA II. Volume status mildly elevated, REDs 37%. Suspect recent chest discomfort related to volume - Increase Entresto to 97/103 mg bid. BMET/BNP today, repeat BMET in 10-14 days - Will give Rx for Lasix 40 mg PRN weight gain/dyspnea/swelling - Continue Jardiance 10 mg daily.  - Continue spironolactone 25 mg daily.  - Continue Coreg 6.25 mg bid.  - We will arrange for cardiac MRI after his spinal stimulator battery is changed and device is thus MRI compatible. I  asked him to notify clinic if/when he is scheduled for batter change. - Repeat echo next visit.  3. Mitral regurgitation: Moderate-severe on 1/24 echo.  However, TEE in 3/24 showed only mild Justin.  4. HTN: BP controlled.   - Increase Entresto as above.  Follow up in 2-3 months with Dr. Shirlee Latch + echo  [facility] FNP-BC  09/05/2022

## 2022-09-05 ENCOUNTER — Encounter (HOSPITAL_COMMUNITY): Payer: Self-pay

## 2022-09-05 ENCOUNTER — Ambulatory Visit (HOSPITAL_COMMUNITY)
Admission: RE | Admit: 2022-09-05 | Discharge: 2022-09-05 | Disposition: A | Discharge status: Home / Self Care | Payer: 59 | Source: Ambulatory Visit | Attending: Family Medicine | Admitting: Family Medicine

## 2022-09-05 VITALS — BP 130/90 | HR 80 | Wt 214.4 lb

## 2022-09-05 DIAGNOSIS — Z79899 Other long term (current) drug therapy: Secondary | ICD-10-CM | POA: Diagnosis not present

## 2022-09-05 DIAGNOSIS — I2510 Atherosclerotic heart disease of native coronary artery without angina pectoris: Secondary | ICD-10-CM | POA: Diagnosis not present

## 2022-09-05 DIAGNOSIS — Z86718 Personal history of other venous thrombosis and embolism: Secondary | ICD-10-CM | POA: Diagnosis not present

## 2022-09-05 DIAGNOSIS — I10 Essential (primary) hypertension: Secondary | ICD-10-CM

## 2022-09-05 DIAGNOSIS — Z7902 Long term (current) use of antithrombotics/antiplatelets: Secondary | ICD-10-CM | POA: Diagnosis not present

## 2022-09-05 DIAGNOSIS — I428 Other cardiomyopathies: Secondary | ICD-10-CM | POA: Insufficient documentation

## 2022-09-05 DIAGNOSIS — Z7984 Long term (current) use of oral hypoglycemic drugs: Secondary | ICD-10-CM | POA: Diagnosis not present

## 2022-09-05 DIAGNOSIS — I340 Nonrheumatic mitral (valve) insufficiency: Secondary | ICD-10-CM | POA: Insufficient documentation

## 2022-09-05 DIAGNOSIS — Z86711 Personal history of pulmonary embolism: Secondary | ICD-10-CM | POA: Diagnosis not present

## 2022-09-05 DIAGNOSIS — I5022 Chronic systolic (congestive) heart failure: Secondary | ICD-10-CM | POA: Insufficient documentation

## 2022-09-05 DIAGNOSIS — I110 Hypertensive heart disease with heart failure: Secondary | ICD-10-CM | POA: Diagnosis not present

## 2022-09-05 LAB — BASIC METABOLIC PANEL
Anion gap: 8 (ref 5–15)
BUN: 6 mg/dL (ref 6–20)
CO2: 25 mmol/L (ref 22–32)
Calcium: 8.6 mg/dL — ABNORMAL LOW (ref 8.9–10.3)
Chloride: 106 mmol/L (ref 98–111)
Creatinine, Ser: 1.16 mg/dL (ref 0.61–1.24)
GFR, Estimated: >60 mL/min (ref >60)
Glucose, Bld: 103 mg/dL — ABNORMAL HIGH (ref 70–99)
Potassium: 3.7 mmol/L (ref 3.5–5.1)
Sodium: 139 mmol/L (ref 135–145)

## 2022-09-05 LAB — BRAIN NATRIURETIC PEPTIDE: B Natriuretic Peptide: 59 pg/mL (ref 0.0–100.0)

## 2022-09-05 MED ORDER — FUROSEMIDE 40 MG PO TABS: 40.0000 mg | ORAL_TABLET | ORAL | 3 refills | Status: DC | PRN

## 2022-09-05 MED ORDER — ENTRESTO 97-103 MG PO TABS: 1.0000 | ORAL_TABLET | Freq: Two times a day (BID) | ORAL | 11 refills | Status: DC

## 2022-09-05 NOTE — Progress Notes (Signed)
ReDS Vest / Clip - 09/05/22 1400       ReDS Vest / Clip   Station Marker C    Ruler Value 28    ReDS Value Range Moderate volume overload    ReDS Actual Value 37

## 2022-09-05 NOTE — Patient Instructions (Signed)
Medication Changes:  INCREASE Entresto to 97/103 twice a day.   TAKE lasix 40 mg (1 tablet) as needed for a weight gain of 3 lbs in 24 hours or 5 lbs within a week.  *If you need a refill on your cardiac medications before your next appointment, please call your pharmacy*  Lab Work:  Labs done today, your results will be available in MyChart, we will contact you for abnormal readings.  Repeat blood work in 2 weeks.   Testing/Procedures:  Your physician has requested that you have an echocardiogram. Echocardiography is a painless test that uses sound waves to create images of your heart. It provides your doctor with information about the size and shape of your heart and how well your heart's chambers and valves are working. This procedure takes approximately one hour. There are no restrictions for this procedure. Please do NOT wear cologne, perfume, aftershave, or lotions (deodorant is allowed). Please arrive 15 minutes prior to your appointment time.   PLEASE CALL us WHEN THE SPINAL STIMULATION BATTERY HAS BEEN CHANGED.   Follow-Up in:   Your physician recommends that you schedule a follow-up appointment in: 3 months with Dr. Shirlee Latch   Do the following things EVERYDAY: Weigh yourself in the morning before breakfast. Write it down and keep it in a log. Take your medicines as prescribed Eat low salt foods--Limit salt (sodium) to 2000 mg per day.  Stay as active as you can everyday Limit all fluids for the day to less than 2 liters    Need to Contact us:  If you have any questions or concerns before your next appointment please send [place] a message through [place] or call our office at [phone].    TO LEAVE A MESSAGE FOR THE NURSE SELECT OPTION 2, PLEASE LEAVE A MESSAGE INCLUDING: YOUR NAME DATE OF BIRTH CALL BACK NUMBER REASON FOR CALL**this is important as we prioritize the call backs  YOU WILL RECEIVE A CALL BACK THE SAME DAY AS LONG AS YOU CALL BEFORE 4:00 PM   At the  [facility], you and your health needs are our priority. As part of our continuing mission to provide you with exceptional heart care, we have created designated Provider Care Teams. These Care Teams include your primary Cardiologist (physician) and Advanced Practice Providers (APPs- Physician Assistants and Nurse Practitioners) who all work together to provide you with the care you need, when you need it.   You may see any of the following providers on your designated Care Team at your next follow up: Dr Arvilla Meres Dr Marca Ancona Dr. Marcos Eke, NP Robbie Lis, [place] [facility] [place], [place] Brynda Peon, NP Karle Plumber, PharmD   Please be sure to bring in all your medications bottles to every appointment.    Thank you for choosing [facility]

## 2022-09-05 NOTE — Addendum Note (Signed)
Encounter addended by: Jacklynn Ganong, FNP on: 09/05/2022 3:13 PM  Actions taken: Clinical Note Signed

## 2022-09-06 ENCOUNTER — Encounter (HOSPITAL_COMMUNITY): Payer: 59

## 2022-09-06 ENCOUNTER — Other Ambulatory Visit (HOSPITAL_COMMUNITY): Payer: Self-pay

## 2022-09-06 NOTE — Progress Notes (Signed)
Paramedicine Encounter    Patient ID: Justin [name], male    DOB: 23-Aug-1965, 57 y.o.   MRN: [id number]   Complaints-tired more easily   Edema-slight   Compliance with meds-yes  Pill box filled-doesn't use  If so, by whom-n/a  Refills needed-none   Pt was seen in clinic yesterday, his entresto was increased to 97-103 and lasix added PRN. His REDS was 37%. Weight up a few lbs. He has many bottles of the 49-51 entresto so advised him to double up on those and once he gets from pharm to take 1 BID at that point.  No recent falls. No issues with other meds.   He reports compliance with meds.  Misses maybe one dose a week.  He reports feeling more tired lately.  He is due tor echo at next visit.   He is going to f/u about getting spinal battery change out to get compatible with MRI.  He reports breathing is doing ok. Takes his time to get to where he is going, summer heat makes it worse also.  He has not started the increased entresto dose.  He said he would be able to p/u meds today.  Will f/u next week.     BP 102/80   Pulse 82   Resp 16   Wt 215 lb (97.5 kg)   SpO2 98%   BMI 31.75 kg/m  Weight yesterday-214 @ clinic  Last visit weight-212  Patient Care Team: Pa, [facility] as PCP - General (Internal Medicine) Christell Constant, MD as PCP - Cardiology (Cardiology)  Patient Active Problem List   Diagnosis Date Noted   Pain in right leg 08/06/2022   Fatigue 07/31/2022   History of DVT (deep vein thrombosis) 07/17/2022   Tremors of nervous system 07/17/2022   Nonischemic cardiomyopathy (HCC) 05/18/2022   Chronic systolic CHF (congestive heart failure) (HCC) 05/18/2022   Snoring 04/04/2022   Normocytic anemia 12/22/2021   History of pulmonary embolism 10/19/2021   Status post lumbar spinal fusion 01/03/2018   Lumbar stenosis 12/23/2017   Spondylolisthesis, lumbar region 06/25/2017   Achilles tendinitis of left lower extremity 12/07/2016   Achilles  tendinitis, left leg 05/17/2016   Obstructive sleep apnea on CPAP 03/07/2015   Achilles rupture, left 10/21/2014   Bipolar disorder, unspecified (HCC) 01/07/2013   Headache(784.0) 01/07/2013   Chronic anticoagulation 01/04/2013    Current Outpatient Medications:    carvedilol (COREG) 6.25 MG tablet, Take 1 tablet (6.25 mg total) by mouth 2 (two) times daily., Disp: 60 tablet, Rfl: 11   dabigatran (PRADAXA) 150 MG CAPS capsule, Take 1 capsule (150 mg total) by mouth 2 (two) times daily., Disp: 60 capsule, Rfl: 0   doxepin (SINEQUAN) 75 MG capsule, Take 75 mg by mouth as needed (sleep)., Disp: , Rfl:    empagliflozin (JARDIANCE) 10 MG TABS tablet, Take 1 tablet (10 mg total) by mouth daily before breakfast., Disp: 30 tablet, Rfl: 11   furosemide (LASIX) 40 MG tablet, Take 1 tablet (40 mg total) by mouth as needed for fluid or edema (take if a weight gain of 3 lbs within 24 hours or 5 lbs in a week.)., Disp: 90 tablet, Rfl: 3   hydrOXYzine (ATARAX) 25 MG tablet, Take 25 mg by mouth 3 (three) times daily as needed for anxiety., Disp: , Rfl:    methocarbamol (ROBAXIN) 500 MG tablet, Take 2 tablets (1,000 mg total) by mouth every 8 (eight) hours as needed for muscle spasms., Disp: 30 tablet, Rfl: 0  mirtazapine (REMERON) 45 MG tablet, Take 45 mg by mouth at bedtime., Disp: , Rfl:    QUEtiapine (SEROQUEL XR) 400 MG 24 hr tablet, Take 800 mg by mouth at bedtime., Disp: , Rfl:    sacubitril-valsartan (ENTRESTO) 97-103 MG, Take 1 tablet by mouth 2 (two) times daily., Disp: 60 tablet, Rfl: 11   spironolactone (ALDACTONE) 25 MG tablet, Take 1 tablet (25 mg total) by mouth daily., Disp: 30 tablet, Rfl: 11   vitamin B-12 (CYANOCOBALAMIN) 100 MCG tablet, Take 1 tablet (100 mcg total) by mouth daily., Disp: 90 tablet, Rfl: 3   HYDROcodone-acetaminophen (NORCO) 7.5-325 MG tablet, as needed for severe pain. (Patient not taking: Reported on 09/06/2022), Disp: , Rfl:  Allergies  Allergen Reactions   Lyrica  [Pregabalin] Other (See Comments)    Hallucinations      Social History   Socioeconomic History   Marital status: Single    Spouse name: Not on file   Number of children: Not on file   Years of education: Not on file   Highest education level: Not on file  Occupational History   Occupation: disability  Tobacco Use   Smoking status: Never   Smokeless tobacco: Never  Vaping Use   Vaping Use: Never used  Substance and Sexual Activity   Alcohol use: No   Drug use: No   Sexual activity: Not on file  Other Topics Concern   Not on file  Social History Narrative   FROM [place], [place]    Lives in a 2 story apartment.  His cousin is currently staying with him.  Has 4 children.  On disability for the past 10 years for pulmonary embolism, bipolar disease.  Education: high school.   Social Determinants of Health   Financial Resource Strain: Not on file  Food Insecurity: No Food Insecurity (07/17/2022)   Hunger Vital Sign    Worried About Running Out of Food in the Last Year: Never true    Ran Out of Food in the Last Year: Never true  Transportation Needs: No Transportation Needs (07/17/2022)   PRAPARE - Administrator, Civil Service (Medical): No    Lack of Transportation (Non-Medical): No  Physical Activity: Not on file  Stress: Not on file  Social Connections: Not on file  Intimate Partner Violence: Not At Risk (07/17/2022)   Humiliation, Afraid, Rape, and Kick questionnaire    Fear of Current or Ex-Partner: No    Emotionally Abused: No    Physically Abused: No    Sexually Abused: No    Physical Exam      Future Appointments  Date Time Provider Department Center  09/19/2022  2:15 PM [facility] LAB [facility] None  10/19/2022  2:00 PM [facility] None  10/19/2022  2:40 PM Jaci Standard, MD [facility] None  12/11/2022  2:00 PM [facility] ECHO OP 1 [facility]  12/11/2022  3:20 PM Laurey Morale, MD [facility] None       Kerry Hough,  Paramedic [phone] [facility] Paramedic  09/06/22

## 2022-09-18 ENCOUNTER — Telehealth (HOSPITAL_COMMUNITY): Payer: Self-pay

## 2022-09-19 ENCOUNTER — Ambulatory Visit (HOSPITAL_COMMUNITY)
Admission: RE | Admit: 2022-09-19 | Discharge: 2022-09-19 | Disposition: A | Discharge status: Home / Self Care | Payer: MEDICAID | Source: Ambulatory Visit | Attending: Cardiology | Admitting: Cardiology

## 2022-09-19 DIAGNOSIS — I5022 Chronic systolic (congestive) heart failure: Secondary | ICD-10-CM | POA: Insufficient documentation

## 2022-09-19 LAB — BASIC METABOLIC PANEL
Anion gap: 7 (ref 5–15)
BUN: 8 mg/dL (ref 6–20)
CO2: 29 mmol/L (ref 22–32)
Calcium: 8.7 mg/dL — ABNORMAL LOW (ref 8.9–10.3)
Chloride: 102 mmol/L (ref 98–111)
Creatinine, Ser: 1.4 mg/dL — ABNORMAL HIGH (ref 0.61–1.24)
GFR, Estimated: 58 mL/min — ABNORMAL LOW (ref >60)
Glucose, Bld: 168 mg/dL — ABNORMAL HIGH (ref 70–99)
Potassium: 3.1 mmol/L — ABNORMAL LOW (ref 3.5–5.1)
Sodium: 138 mmol/L (ref 135–145)

## 2022-09-26 ENCOUNTER — Telehealth (HOSPITAL_COMMUNITY): Payer: Self-pay

## 2022-09-26 DIAGNOSIS — I5022 Chronic systolic (congestive) heart failure: Secondary | ICD-10-CM

## 2022-09-26 MED ORDER — POTASSIUM CHLORIDE CRYS ER 20 MEQ PO TBCR: EXTENDED_RELEASE_TABLET | ORAL | 0 refills | Status: DC

## 2022-09-26 NOTE — Telephone Encounter (Signed)
Patient advised and verbalized understanding,lab appointment scheduled,lab orders entered. New Rx sent into patients pharmacy.   Meds ordered this encounter  Medications   potassium chloride SA (KLOR-CON M20) 20 MEQ tablet    Sig: Take 2 tablets (40 mEq total) by mouth 2 (two) times daily for 1 day, THEN 2 tablets (40 mEq total) daily.    Dispense:  64 tablet    Refill:  0   Orders Placed This Encounter  Procedures   Basic metabolic panel    Standing Status:   Future    Standing Expiration Date:   09/26/2023    Order Specific Question:   Release to patient    Answer:   Immediate    Order Specific Question:   Release to patient    Answer:   Immediate [1]

## 2022-09-26 NOTE — Telephone Encounter (Signed)
-----   Message from Jacklynn Ganong, [place] sent at 09/19/2022  4:31 PM EDT ----- K is low. Please make sure he is taking his spiro 25 mg daily.  Take 40 KCL bid x 1 day, then start 40 KCL daily.  Repeat BMET early next week.

## 2022-09-27 ENCOUNTER — Other Ambulatory Visit (HOSPITAL_COMMUNITY): Payer: Self-pay

## 2022-09-27 NOTE — Progress Notes (Signed)
I arrived at pts home for our home visit appointment.  Pts door was closed, its normally open-  I knocked several times and no answer. I had called and texted also and no answer/response.   Will f/u next week.   Kerry Hough, EMT-Paramedic  [phone] 09/27/2022

## 2022-09-28 ENCOUNTER — Other Ambulatory Visit: Payer: Self-pay | Admitting: Physician Assistant

## 2022-10-01 NOTE — Telephone Encounter (Signed)
Reached out to pt to sch home visit and to f/u on med change.  No response/no answer.   Kerry Hough, EMT-Paramedic  [phone] 09/18/2022

## 2022-10-05 ENCOUNTER — Ambulatory Visit (HOSPITAL_COMMUNITY)
Admission: RE | Admit: 2022-10-05 | Discharge: 2022-10-05 | Disposition: A | Discharge status: Home / Self Care | Payer: MEDICAID | Source: Ambulatory Visit | Attending: Cardiology | Admitting: Cardiology

## 2022-10-05 DIAGNOSIS — I5022 Chronic systolic (congestive) heart failure: Secondary | ICD-10-CM | POA: Insufficient documentation

## 2022-10-05 LAB — BASIC METABOLIC PANEL
Anion gap: 9 (ref 5–15)
BUN: 6 mg/dL (ref 6–20)
CO2: 26 mmol/L (ref 22–32)
Calcium: 9 mg/dL (ref 8.9–10.3)
Chloride: 103 mmol/L (ref 98–111)
Creatinine, Ser: 1.46 mg/dL — ABNORMAL HIGH (ref 0.61–1.24)
GFR, Estimated: 55 mL/min — ABNORMAL LOW (ref >60)
Glucose, Bld: 124 mg/dL — ABNORMAL HIGH (ref 70–99)
Potassium: 3.9 mmol/L (ref 3.5–5.1)
Sodium: 138 mmol/L (ref 135–145)

## 2022-10-08 ENCOUNTER — Other Ambulatory Visit (HOSPITAL_COMMUNITY): Payer: Self-pay

## 2022-10-08 ENCOUNTER — Telehealth (HOSPITAL_COMMUNITY): Payer: Self-pay

## 2022-10-08 NOTE — Progress Notes (Unsigned)
Paramedicine Encounter    Patient ID: Justin [name], male    DOB: June 15, 1965, 57 y.o.   MRN: [id number]   Complaints-none presently, sharp c/p a few days ago.  Edema-none   Compliance with meds-reports he is   Pill box filled-no doesn't use, we talked about the use for this  If so, by whom-doesn't use   Refills needed-yes he is calling in while I am here--remeron, spiro and 6.25mg  of carvedilol   Pt reports he is doing ok. He said he was having sharp pains around his left chest around the nipple area a few days ago.  he said it went away on his own. He said he was just laying around when it started. He denies any strenuous work, heavy lifting  to pull a muscle. He denied any sob at that time. He is having moving pains from his back to his legs.  He denies increased sob, no dizziness.  Pt reports appetite ok.  Pt denies any bleeding issues.    He was able to bring his pill bottles this time previously he has said he needed to get things from the pharmacy-however he kept going back and forth to the room to gather more meds as we went down list  Carvedilol-has the 3.125mg  BID -we talked about this-he said he had the other pill bottle somewhere but got it called in during visit and it was able to be filled.  Out of remeron-he will call in.   Lasix-he takes a couple a week on average-he then said he takes it daily-but then says just when he needs it--advised him to take PRN  Cleda Daub bottle is empty  He has a pravastatin bottle but says he doesn't take it but can't recall as to who or why he has that decision.  Sent message to triage regarding this. Will f/u.  Weights doing good. No edema noted. No other issues or complaints.   BP 102/78   Pulse 85   Resp 16   Wt 210 lb (95.3 kg)   SpO2 98%   BMI 31.01 kg/m  Weight yesterday--? Last visit weight-215   Patient Care Team: Pa, [facility] as PCP - General (Internal Medicine) Christell Constant, MD as PCP - Cardiology  (Cardiology)  Patient Active Problem List   Diagnosis Date Noted   Pain in right leg 08/06/2022   Fatigue 07/31/2022   History of DVT (deep vein thrombosis) 07/17/2022   Tremors of nervous system 07/17/2022   Nonischemic cardiomyopathy (HCC) 05/18/2022   Chronic systolic CHF (congestive heart failure) (HCC) 05/18/2022   Snoring 04/04/2022   Normocytic anemia 12/22/2021   History of pulmonary embolism 10/19/2021   Status post lumbar spinal fusion 01/03/2018   Lumbar stenosis 12/23/2017   Spondylolisthesis, lumbar region 06/25/2017   Achilles tendinitis of left lower extremity 12/07/2016   Achilles tendinitis, left leg 05/17/2016   Obstructive sleep apnea on CPAP 03/07/2015   Achilles rupture, left 10/21/2014   Bipolar disorder, unspecified (HCC) 01/07/2013   Headache(784.0) 01/07/2013   Chronic anticoagulation 01/04/2013    Current Outpatient Medications:    carvedilol (COREG) 6.25 MG tablet, Take 1 tablet (6.25 mg total) by mouth 2 (two) times daily., Disp: 60 tablet, Rfl: 11   cetirizine (ZYRTEC) 10 MG tablet, Take 10 mg by mouth daily., Disp: , Rfl:    dabigatran (PRADAXA) 150 MG CAPS capsule, TAKE 1 CAPSULE(150 MG) BY MOUTH TWICE DAILY, Disp: 180 capsule, Rfl: 3   doxepin (SINEQUAN) 75 MG capsule, Take 75  mg by mouth as needed (sleep)., Disp: , Rfl:    empagliflozin (JARDIANCE) 10 MG TABS tablet, Take 1 tablet (10 mg total) by mouth daily before breakfast., Disp: 30 tablet, Rfl: 11   furosemide (LASIX) 40 MG tablet, Take 1 tablet (40 mg total) by mouth as needed for fluid or edema (take if a weight gain of 3 lbs within 24 hours or 5 lbs in a week.)., Disp: 90 tablet, Rfl: 3   hydrOXYzine (ATARAX) 25 MG tablet, Take 25 mg by mouth 3 (three) times daily as needed for anxiety., Disp: , Rfl:    methocarbamol (ROBAXIN) 500 MG tablet, Take 2 tablets (1,000 mg total) by mouth every 8 (eight) hours as needed for muscle spasms., Disp: 30 tablet, Rfl: 0   mirtazapine (REMERON) 45 MG  tablet, Take 45 mg by mouth at bedtime., Disp: , Rfl:    potassium chloride SA (KLOR-CON M20) 20 MEQ tablet, Take 2 tablets (40 mEq total) by mouth 2 (two) times daily for 1 day, THEN 2 tablets (40 mEq total) daily., Disp: 64 tablet, Rfl: 0   QUEtiapine (SEROQUEL XR) 400 MG 24 hr tablet, Take 800 mg by mouth at bedtime., Disp: , Rfl:    sacubitril-valsartan (ENTRESTO) 97-103 MG, Take 1 tablet by mouth 2 (two) times daily., Disp: 60 tablet, Rfl: 11   spironolactone (ALDACTONE) 25 MG tablet, Take 1 tablet (25 mg total) by mouth daily., Disp: 30 tablet, Rfl: 11   tiZANidine (ZANAFLEX) 4 MG tablet, Take 4 mg by mouth every 6 (six) hours as needed for muscle spasms., Disp: , Rfl:    vitamin B-12 (CYANOCOBALAMIN) 100 MCG tablet, Take 1 tablet (100 mcg total) by mouth daily., Disp: 90 tablet, Rfl: 3   HYDROcodone-acetaminophen (NORCO) 7.5-325 MG tablet, as needed for severe pain. (Patient not taking: Reported on 09/06/2022), Disp: , Rfl:  Allergies  Allergen Reactions   Lyrica [Pregabalin] Other (See Comments)    Hallucinations      Social History   Socioeconomic History   Marital status: Single    Spouse name: Not on file   Number of children: Not on file   Years of education: Not on file   Highest education level: Not on file  Occupational History   Occupation: disability  Tobacco Use   Smoking status: Never   Smokeless tobacco: Never  Vaping Use   Vaping status: Never Used  Substance and Sexual Activity   Alcohol use: No   Drug use: No   Sexual activity: Not on file  Other Topics Concern   Not on file  Social History Narrative   FROM [place], [place]    Lives in a 2 story apartment.  His cousin is currently staying with him.  Has 4 children.  On disability for the past 10 years for pulmonary embolism, bipolar disease.  Education: high school.   Social Determinants of Health   Financial Resource Strain: Not on file  Food Insecurity: No Food Insecurity (07/17/2022)   Hunger Vital  Sign    Worried About Running Out of Food in the Last Year: Never true    Ran Out of Food in the Last Year: Never true  Transportation Needs: No Transportation Needs (07/17/2022)   PRAPARE - Administrator, Civil Service (Medical): No    Lack of Transportation (Non-Medical): No  Physical Activity: Not on file  Stress: Not on file  Social Connections: Unknown (07/31/2021)   Received from [facility]   Social Network    Social  Network: Not on file  Intimate Partner Violence: Not At Risk (07/17/2022)   Humiliation, Afraid, Rape, and Kick questionnaire    Fear of Current or Ex-Partner: No    Emotionally Abused: No    Physically Abused: No    Sexually Abused: No    Physical Exam      Future Appointments  Date Time Provider Department Center  10/19/2022  2:00 PM [facility] None  10/19/2022  2:40 PM Jaci Standard, MD [facility] None  12/11/2022  2:00 PM [facility] ECHO OP 1 [facility]  12/11/2022  3:20 PM Laurey Morale, MD [facility] None       Kerry Hough, Paramedic [phone] [facility] Paramedic  10/09/22

## 2022-10-08 NOTE — Telephone Encounter (Signed)
Surgical clearance faxed to [facility] on 10/08/22 at [phone]

## 2022-10-13 ENCOUNTER — Ambulatory Visit (HOSPITAL_COMMUNITY)
Admission: EM | Admit: 2022-10-13 | Discharge: 2022-10-13 | Disposition: A | Discharge status: Home / Self Care | Payer: MEDICAID | Attending: Physician Assistant | Admitting: Physician Assistant

## 2022-10-13 ENCOUNTER — Encounter (HOSPITAL_COMMUNITY): Payer: Self-pay

## 2022-10-13 DIAGNOSIS — S46311A Strain of muscle, fascia and tendon of triceps, right arm, initial encounter: Secondary | ICD-10-CM

## 2022-10-13 MED ORDER — HYDROCODONE-ACETAMINOPHEN 5-325 MG PO TABS: 1.0000 | ORAL_TABLET | Freq: Four times a day (QID) | ORAL | 0 refills | Status: DC | PRN

## 2022-10-13 NOTE — ED Triage Notes (Signed)
Patient here today with c/o right posterior upper arm pain that started this morning upon waking. Patient moved arm outward to stretch and felt a pop on the back of his upper arm. There is some swelling and he cannot straighten his arm.

## 2022-10-13 NOTE — ED Provider Notes (Signed)
[facility]   MRN: [id number] DOB: 19-Oct-1965  Subjective:   Justin [name] is a 56 y.o. male presenting for right arm pain since this morning.  Patient states that he was starting to do his chores around the house this morning when he stretched his right arm outward and felt a pop along the back of his arm.  Immediately had swelling and cannot fully extend his arm.  Currently holding his arm across his chest in flexion.   He is left-hand dominant.  Rates pain 10/10.   No current facility-administered medications for this encounter.  Current Outpatient Medications:    carvedilol (COREG) 6.25 MG tablet, Take 1 tablet (6.25 mg total) by mouth 2 (two) times daily., Disp: 60 tablet, Rfl: 11   dabigatran (PRADAXA) 150 MG CAPS capsule, TAKE 1 CAPSULE(150 MG) BY MOUTH TWICE DAILY, Disp: 180 capsule, Rfl: 3   doxepin (SINEQUAN) 75 MG capsule, Take 75 mg by mouth as needed (sleep)., Disp: , Rfl:    empagliflozin (JARDIANCE) 10 MG TABS tablet, Take 1 tablet (10 mg total) by mouth daily before breakfast., Disp: 30 tablet, Rfl: 11   furosemide (LASIX) 40 MG tablet, Take 1 tablet (40 mg total) by mouth as needed for fluid or edema (take if a weight gain of 3 lbs within 24 hours or 5 lbs in a week.)., Disp: 90 tablet, Rfl: 3   HYDROcodone-acetaminophen (NORCO/VICODIN) 5-325 MG tablet, Take 1 tablet by mouth every 6 (six) hours as needed for moderate pain or severe pain., Disp: 20 tablet, Rfl: 0   hydrOXYzine (ATARAX) 25 MG tablet, Take 25 mg by mouth 3 (three) times daily as needed for anxiety., Disp: , Rfl:    mirtazapine (REMERON) 45 MG tablet, Take 45 mg by mouth at bedtime., Disp: , Rfl:    potassium chloride SA (KLOR-CON M20) 20 MEQ tablet, Take 2 tablets (40 mEq total) by mouth 2 (two) times daily for 1 day, THEN 2 tablets (40 mEq total) daily., Disp: 64 tablet, Rfl: 0   QUEtiapine (SEROQUEL XR) 400 MG 24 hr tablet, Take 800 mg by mouth at bedtime., Disp: , Rfl:     sacubitril-valsartan (ENTRESTO) 97-103 MG, Take 1 tablet by mouth 2 (two) times daily., Disp: 60 tablet, Rfl: 11   spironolactone (ALDACTONE) 25 MG tablet, Take 1 tablet (25 mg total) by mouth daily., Disp: 30 tablet, Rfl: 11   cetirizine (ZYRTEC) 10 MG tablet, Take 10 mg by mouth daily., Disp: , Rfl:    vitamin B-12 (CYANOCOBALAMIN) 100 MCG tablet, Take 1 tablet (100 mcg total) by mouth daily., Disp: 90 tablet, Rfl: 3   Allergies  Allergen Reactions   Lyrica [Pregabalin] Other (See Comments)    Hallucinations    Past Medical History:  Diagnosis Date   Achilles rupture, left    Acute deep vein thrombosis (DVT) of right peroneal vein (HCC) 10/19/2021   Bipolar 1 disorder (HCC)    Dental caries    periodontitis   Pneumonia    Pulmonary embolism (HCC) 05/18/2007   SDH (subdural hematoma) (HCC) 01/04/2013   Sleep apnea    wears CPAP   Subdural hematoma (HCC) 01/04/2013   in setting of supratherapeutic INR   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     Past Surgical History:  Procedure Laterality Date   ACHILLES TENDON SURGERY Left 10/21/2014   Procedure: Left Achilles Reconstruction;  Surgeon: Nadara Mustard, MD;  Location: [facility] OR;  Service: Orthopedics;  Laterality: Left;   APPENDECTOMY  CARDIAC CATHETERIZATION  03/04/2018   [facility]: Normal coronaries, LVEF estimated at 40%, medical Rx   CRANIOTOMY N/A 01/19/2013   Procedure: CRANIOTOMY HEMATOMA EVACUATION SUBDURAL;  Surgeon: Hewitt Shorts, MD;  Location: [facility] NEURO ORS;  Service: Neurosurgery;  Laterality: N/A;   CYSTECTOMY     right head   ELBOW SURGERY     right   FRACTURE SURGERY     finger   I & D EXTREMITY Left 12/07/2016   Procedure: LEFT ACHILLES DEBRIDEMENT;  Surgeon: Nadara Mustard, MD;  Location: [facility] OR;  Service: Orthopedics;  Laterality: Left;   LUMBAR FUSION  12/23/2017   L5 GILL PROCEDURE, RIGHT L5-S1, TRANSFORAMIAL LUMBAR INTERBODY FUSION, BILATERAL LATERAL FUSION, PEDICLE INSTRUMENTATION    MULTIPLE EXTRACTIONS WITH ALVEOLOPLASTY N/A 03/07/2015   Procedure: MULTIPLE EXTRACTION WITH ALVEOLOPLASTY;  Surgeon: [name], DDS;  Location: [facility] OR;  Service: Oral Surgery;  Laterality: N/A;   RIGHT/LEFT HEART CATH AND CORONARY ANGIOGRAPHY N/A 05/18/2022   Procedure: RIGHT/LEFT HEART CATH AND CORONARY ANGIOGRAPHY;  Surgeon: Laurey Morale, MD;  Location: [facility] INVASIVE CV LAB;  Service: Cardiovascular;  Laterality: N/A;   SPINAL CORD STIMULATOR INSERTION N/A 05/18/2019   Procedure: LUMBAR SPINAL CORD STIMULATOR INSERTION;  Surgeon: Odette Fraction, MD;  Location: [facility] OR;  Service: Neurosurgery;  Laterality: N/A;  Thoracic/Lumbar   SPINAL CORD STIMULATOR INSERTION N/A 04/06/2020   Procedure: Revision of spinal cord stimulator;  Surgeon: Renaldo Fiddler, MD;  Location: [facility] OR;  Service: Neurosurgery;  Laterality: N/A;   TEE WITHOUT CARDIOVERSION N/A 05/18/2022   Procedure: TRANSESOPHAGEAL ECHOCARDIOGRAM (TEE);  Surgeon: Laurey Morale, MD;  Location: [facility] ENDOSCOPY;  Service: Cardiovascular;  Laterality: N/A;    Family History  Problem Relation Age of Onset   Hypertension Mother    Stroke Mother    Multiple sclerosis Sister    Down syndrome Son     Social History   Tobacco Use   Smoking status: Never   Smokeless tobacco: Never  Vaping Use   Vaping status: Never Used  Substance Use Topics   Alcohol use: No   Drug use: No    ROS REFER TO HPI FOR PERTINENT POSITIVES AND NEGATIVES   Objective:   Vitals: BP 99/67 (BP Location: Left Arm)   Pulse 93   Temp 98.6 F (37 C) (Oral)   Resp 16   Ht 5\' 9"  (1.753 m)   Wt 210 lb (95.3 kg)   SpO2 93%   BMI 31.01 kg/m   Physical Exam Vitals and nursing note reviewed.  Constitutional:      General: He is in acute distress (in pain).     Appearance: Normal appearance.  Musculoskeletal:        General: Swelling and tenderness present.     Right upper arm: Tenderness (distal triceps very tender to palpation; palpable bulge) present.      Comments: I am able to passively extend the elbow to about 150 degrees, then he pulls back because of pain. Pain with any resistance against the triceps.   Skin:    Findings: No rash.  Neurological:     General: No focal deficit present.     Mental Status: He is alert.  Psychiatric:        Mood and Affect: Mood normal.     No results found for this or any previous visit (from the past 24 hour(s)).  Assessment and Plan :   I have reviewed the PDMP during this encounter.  1. Rupture of right  triceps tendon, initial encounter    I strongly suspect a distal right triceps tendon rupture, or very severe strain.  He did not fall, and given the history provided today, I do not feel that an x-ray is necessary at this time.  Ultrasound would be preferred method to fully evaluate, which is not available at the urgent care.  I was able to get in touch with Dr. Denyse Amass at [facility] sports medicine and he advised a posterior arm splint, sling, follow-up with them next week. Pt will rest this weekend. Ice to area as needed. Norco as directed for moderate to severe pain, Pt aware of risks vs benefits and possible adverse reactions. He won't drive with this medication. RTC precautions discussed.     AllwardtCrist Infante, PA-C 10/13/22 1531

## 2022-10-13 NOTE — Discharge Instructions (Signed)
Good to meet you today.  I think you may have a right tendon triceps rupture.  Please keep the splint on.  Wear the sling for comfort.  You can take the Norco as directed to help with severe pain.  Do not drive with this medication.  Try to rest and ice this weekend.  Follow-up with [facility] next week.  Please call for an appointment.

## 2022-10-13 NOTE — Progress Notes (Signed)
Orthopedic Tech Progress Note Patient Details:  Justin [name] 12/08/1965 [id number]  Ortho Devices Type of Ortho Device: Long arm splint, Arm sling Ortho Device/Splint Location: RUE Ortho Device/Splint Interventions: Application   Post Interventions Patient Tolerated: Well Instructions Provided: Care of device  [name] E Jaron Czarnecki 10/13/2022, 3:10 PM

## 2022-10-16 ENCOUNTER — Encounter: Payer: Self-pay | Admitting: Family Medicine

## 2022-10-16 ENCOUNTER — Ambulatory Visit (INDEPENDENT_AMBULATORY_CARE_PROVIDER_SITE_OTHER): Payer: MEDICAID | Admitting: Family Medicine

## 2022-10-16 ENCOUNTER — Other Ambulatory Visit: Payer: Self-pay

## 2022-10-16 VITALS — BP 138/84 | HR 95 | Ht 69.0 in | Wt 210.0 lb

## 2022-10-16 DIAGNOSIS — S46311A Strain of muscle, fascia and tendon of triceps, right arm, initial encounter: Secondary | ICD-10-CM

## 2022-10-16 NOTE — Patient Instructions (Signed)
Thank you for coming in today.   I've referred you to Occupational Therapy.  Let us know if you don't hear from them in one week.   You should her from Dr Serena Croissant office soon.   Let me know if this is not working.   Use the splint if you get a lot worse.

## 2022-10-16 NOTE — Progress Notes (Unsigned)
I, Justin [name], CMA acting as a scribe for Justin Graham, MD.  Justin [name] is a 57 y.o. male who presents to [facility] today for injury to the right triceps. Pt was seen at Urgent Care on 10/13/22 and reported the following;   he was starting to do his chores around the house on 05/15/22 when he stretched his right arm outward and felt a pop along the back of his arm.  Immediately had swelling and could not fully extend his arm.  Improves when holding his arm across his chest in flexion.   Today pt reports continued pain in the arm and elbow. In sling and cast today. Taking Hydrocodone-PAP.  Hx of tendon rupture of the achilles. Hx of blood clots in the lungs.  Current spinal cord stimulator that is not MRI compatible.   Pertinent review of systems: No fevers or chills  Relevant historical information: Not currently working.  Patient stays home taking care of his adult child with Down syndrome.   Exam:  BP 138/84   Pulse 95   Ht 5\' 9"  (1.753 m)   Wt 210 lb (95.3 kg)   SpO2 97%   BMI 31.01 kg/m  General: Well Developed, well nourished, and in no acute distress.   MSK: Right elbow normal. Tender palpation distal triceps. Range of motion generally intact. Strength somewhat reduced resisted elbow extension with pain. Pulses cap refill sensation intact distally.    Lab and Radiology Results  Diagnostic Limited MSK Ultrasound of: Right triceps distal Apparent partial disruption distal triceps tendon.  No full-thickness tear is present. Impression: Concern partial-thickness tear of triceps.     Assessment and Plan: 57 y.o. male with right elbow pain occurring with an extension injury.  Ultrasound today is concerning for partial triceps tendon tear.  Thankfully he does not have a full-thickness tear on ultrasound.  Ideally I would like to visualize this with an MRI however he has a device that is not MRI compatible implanted in his body.   Plan to refer to occupational therapy.  For now however we will also get a second opinion with Dr. Rosetta Posner.  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  I think a second opinion about necessity for surgical repair here is warranted.  I do not think he is going to need surgery but therapy should be helpful.   PDMP not reviewed this encounter. Orders Placed This Encounter  Procedures   [place] LIMITED JOINT SPACE STRUCTURES UP RIGHT(NO LINKED CHARGES)    Order Specific Question:   Reason for Exam (SYMPTOM  OR DIAGNOSIS REQUIRED)    Answer:   right shoulder pain    Order Specific Question:   Preferred imaging location?    Answer:   [facility] referral to Orthopedic Surgery    Referral Priority:   Routine    Referral Type:   Surgical    Referral Reason:   Specialty Services Required    Referred to Provider:   Huel Cote, MD    Requested Specialty:   Orthopedic Surgery    Number of Visits Requested:   1   Ambulatory referral to Occupational Therapy    Referral Priority:   Routine    Referral Type:   Occupational Therapy    Referral Reason:   Specialty Services Required    Requested Specialty:   Occupational Therapy    Number of Visits Requested:  1   No orders of the defined types were placed in this encounter.    Discussed warning signs or symptoms. Please see discharge instructions. Patient expresses understanding.   The above documentation has been reviewed and is accurate and complete Justin [name], M.D.

## 2022-10-17 ENCOUNTER — Telehealth (HOSPITAL_COMMUNITY): Payer: Self-pay | Admitting: Licensed Clinical Social Worker

## 2022-10-17 NOTE — Telephone Encounter (Signed)
HF Paramedicine Team Based Care Meeting  Three Months Post Enrollment Assessment  HF MD- NA  HF NP - Amy Clegg NP-C   [facility] HF Paramedicine  Katie Vicente Males  [facility] admit within the last 30 days for heart failure? no  Medications concerns? Still having some medication errors.  Starting doing pill boxes with him so working on education  [facility] Referrals Placed: n/a  Barriers to discharge? Continued education on medication management    Burna Sis, LCSW Clinical Social Worker [facility] Desk#: [phone] Cell#: [phone]

## 2022-10-19 ENCOUNTER — Other Ambulatory Visit: Payer: Commercial Managed Care - HMO

## 2022-10-19 ENCOUNTER — Ambulatory Visit: Payer: Commercial Managed Care - HMO | Admitting: Hematology and Oncology

## 2022-10-22 ENCOUNTER — Other Ambulatory Visit: Payer: Self-pay | Admitting: Neurological Surgery

## 2022-10-23 ENCOUNTER — Inpatient Hospital Stay: Payer: MEDICAID | Attending: Hematology and Oncology

## 2022-10-23 ENCOUNTER — Inpatient Hospital Stay (HOSPITAL_BASED_OUTPATIENT_CLINIC_OR_DEPARTMENT_OTHER): Payer: MEDICAID | Admitting: Hematology and Oncology

## 2022-10-23 ENCOUNTER — Other Ambulatory Visit: Payer: Self-pay | Admitting: Hematology and Oncology

## 2022-10-23 ENCOUNTER — Other Ambulatory Visit: Payer: Self-pay | Admitting: *Deleted

## 2022-10-23 DIAGNOSIS — I82451 Acute embolism and thrombosis of right peroneal vein: Secondary | ICD-10-CM

## 2022-10-23 DIAGNOSIS — Z86711 Personal history of pulmonary embolism: Secondary | ICD-10-CM

## 2022-10-23 DIAGNOSIS — Z86718 Personal history of other venous thrombosis and embolism: Secondary | ICD-10-CM | POA: Insufficient documentation

## 2022-10-23 DIAGNOSIS — Z7901 Long term (current) use of anticoagulants: Secondary | ICD-10-CM | POA: Insufficient documentation

## 2022-10-23 LAB — CBC WITH DIFFERENTIAL (CANCER CENTER ONLY)
Abs Immature Granulocytes: 0.01 10*3/uL (ref 0.00–0.07)
Basophils Absolute: 0.1 10*3/uL (ref 0.0–0.1)
Basophils Relative: 1 %
Eosinophils Absolute: 0.2 10*3/uL (ref 0.0–0.5)
Eosinophils Relative: 3 %
HCT: 41 % (ref 39.0–52.0)
Hemoglobin: 13.9 g/dL (ref 13.0–17.0)
Immature Granulocytes: 0 %
Lymphocytes Relative: 35 %
Lymphs Abs: 2.3 10*3/uL (ref 0.7–4.0)
MCH: 27.6 pg (ref 26.0–34.0)
MCHC: 33.9 g/dL (ref 30.0–36.0)
MCV: 81.5 fL (ref 80.0–100.0)
Monocytes Absolute: 0.5 10*3/uL (ref 0.1–1.0)
Monocytes Relative: 7 %
Neutro Abs: 3.5 10*3/uL (ref 1.7–7.7)
Neutrophils Relative %: 54 %
Platelet Count: 273 10*3/uL (ref 150–400)
RBC: 5.03 MIL/uL (ref 4.22–5.81)
RDW: 13.8 % (ref 11.5–15.5)
WBC Count: 6.6 10*3/uL (ref 4.0–10.5)
nRBC: 0 % (ref 0.0–0.2)

## 2022-10-23 LAB — CMP (CANCER CENTER ONLY)
ALT: 10 U/L (ref 0–44)
AST: 15 U/L (ref 15–41)
Albumin: 4.6 g/dL (ref 3.5–5.0)
Alkaline Phosphatase: 96 U/L (ref 38–126)
Anion gap: 9 (ref 5–15)
BUN: 11 mg/dL (ref 6–20)
CO2: 25 mmol/L (ref 22–32)
Calcium: 9.3 mg/dL (ref 8.9–10.3)
Chloride: 103 mmol/L (ref 98–111)
Creatinine: 1.72 mg/dL — ABNORMAL HIGH (ref 0.61–1.24)
GFR, Estimated: 45 mL/min — ABNORMAL LOW (ref >60)
Glucose, Bld: 163 mg/dL — ABNORMAL HIGH (ref 70–99)
Potassium: 4.2 mmol/L (ref 3.5–5.1)
Sodium: 137 mmol/L (ref 135–145)
Total Bilirubin: 0.4 mg/dL (ref 0.3–1.2)
Total Protein: 7.8 g/dL (ref 6.5–8.1)

## 2022-10-23 NOTE — Progress Notes (Signed)
[facility] [phone]   [phone]  PROGRESS NOTE  Patient Care Team: Pa, [facility] as PCP - General (Internal Medicine) Christell Constant, MD as PCP - Cardiology (Cardiology)  Hematological/Oncological History 1) March 2009: Diagnosed with pulmonary embolism with right heart strain. Started on anticoagulation with coumadin 2) October 2014: Developed subdural hematoma. INR 5.07 while on coumadin. Neurosurgery requested to hold anticoagulation. Coagulapathy was reversed with vitamin K and FFP. IV filter was placed by IR.  3) November 2014: Underwent right front parietal craniotomy with subdural hematoma evacuation. 4) 09/23/2021: While on Eliquis therapy (duration unknown), developed acute DVT in right peroneal veins. Switch to Lovenox 1 mg/kg twice daily 5) 10/12/2021: Returned to ED for worsening right leg pain. Repeat doppler US showed new clot progression further into the left peroneal vein as compared to 09/23/2021 study.  6) 10/18/2021: Establish care with [facility] with Dr. Leonides Schanz and Georga Kaufmann PA-C  -Labs showed  elevated beta-2-glycoprotein antibodies   -Switched to coumadin and INR levels managed by PCP.  7) 12/08/2021-12/10/2021: Admitted for spontaneous large left thigh intramuscular hematoma in the setting of supratherapeutic INR greater than 7.  Coumadin was discontinued and patient was switched to Eliquis.   CHIEF COMPLAINTS/PURPOSE OF CONSULTATION:  "History of PE and recent right lower extremity DVT "  HISTORY OF PRESENTING ILLNESS:  Justin [name] 57 y.o. male returns for a follow up for history of PE/DVT. He was last seen on 04/20/2022.  In the interim since her last visit he transitioned again to Pradaxa therapy twice daily.  On exam today, Justin [name] reports he is feeling tired today.  He reports his energy levels have been relatively low lately.  He reports that he has had his ups and downs in the last 6 months.  He  notes he has surgery next week upcoming for a battery exchange on his pacemaker.  He reports that he is tolerating his Pradaxa well with no bleeding, bruising, or dark stools.  He is not having any chest pain or shortness of breath.  He is having little bit of puffiness in his right ankle but no pain or discomfort.  He reports the blood thinner price is very reasonable and he is paying $0 a month.  Overall he is willing and able to continue on anticoagulation therapy at this time.  He is not having any signs or symptoms of recurrent VTE.  He denies fevers, chills, sweats, shortness of breath, chest pain or cough. He has no other complaints.  Rest of the 10 point ROS is below.  MEDICAL HISTORY:  Past Medical History:  Diagnosis Date   Achilles rupture, left    Acute deep vein thrombosis (DVT) of right peroneal vein (HCC) 10/19/2021   Bipolar 1 disorder (HCC)    Dental caries    periodontitis   Pneumonia    Pulmonary embolism (HCC) 05/18/2007   SDH (subdural hematoma) (HCC) 01/04/2013   Sleep apnea    wears CPAP   Subdural hematoma (HCC) 01/04/2013   in setting of supratherapeutic INR   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    SURGICAL HISTORY: Past Surgical History:  Procedure Laterality Date   ACHILLES TENDON SURGERY Left 10/21/2014   Procedure: Left Achilles Reconstruction;  Surgeon: Nadara Mustard, MD;  Location: [facility] OR;  Service: Orthopedics;  Laterality: Left;   APPENDECTOMY     CARDIAC CATHETERIZATION  03/04/2018   [facility]: Normal coronaries, LVEF estimated at 40%, medical Rx   CRANIOTOMY N/A  01/19/2013   Procedure: CRANIOTOMY HEMATOMA EVACUATION SUBDURAL;  Surgeon: Hewitt Shorts, MD;  Location: [facility] NEURO ORS;  Service: Neurosurgery;  Laterality: N/A;   CYSTECTOMY     right head   ELBOW SURGERY     right   FRACTURE SURGERY     finger   I & D EXTREMITY Left 12/07/2016   Procedure: LEFT ACHILLES DEBRIDEMENT;  Surgeon: Nadara Mustard, MD;  Location: [facility] OR;   Service: Orthopedics;  Laterality: Left;   LUMBAR FUSION  12/23/2017   L5 GILL PROCEDURE, RIGHT L5-S1, TRANSFORAMIAL LUMBAR INTERBODY FUSION, BILATERAL LATERAL FUSION, PEDICLE INSTRUMENTATION   MULTIPLE EXTRACTIONS WITH ALVEOLOPLASTY N/A 03/07/2015   Procedure: MULTIPLE EXTRACTION WITH ALVEOLOPLASTY;  Surgeon: [name], DDS;  Location: [facility] OR;  Service: Oral Surgery;  Laterality: N/A;   RIGHT/LEFT HEART CATH AND CORONARY ANGIOGRAPHY N/A 05/18/2022   Procedure: RIGHT/LEFT HEART CATH AND CORONARY ANGIOGRAPHY;  Surgeon: Laurey Morale, MD;  Location: [facility] INVASIVE CV LAB;  Service: Cardiovascular;  Laterality: N/A;   SPINAL CORD STIMULATOR INSERTION N/A 05/18/2019   Procedure: LUMBAR SPINAL CORD STIMULATOR INSERTION;  Surgeon: Odette Fraction, MD;  Location: [facility] OR;  Service: Neurosurgery;  Laterality: N/A;  Thoracic/Lumbar   SPINAL CORD STIMULATOR INSERTION N/A 04/06/2020   Procedure: Revision of spinal cord stimulator;  Surgeon: Renaldo Fiddler, MD;  Location: [facility] OR;  Service: Neurosurgery;  Laterality: N/A;   TEE WITHOUT CARDIOVERSION N/A 05/18/2022   Procedure: TRANSESOPHAGEAL ECHOCARDIOGRAM (TEE);  Surgeon: Laurey Morale, MD;  Location: [facility] ENDOSCOPY;  Service: Cardiovascular;  Laterality: N/A;    SOCIAL HISTORY: Social History   Socioeconomic History   Marital status: Single    Spouse name: Not on file   Number of children: Not on file   Years of education: Not on file   Highest education level: Not on file  Occupational History   Occupation: disability  Tobacco Use   Smoking status: Never   Smokeless tobacco: Never  Vaping Use   Vaping status: Never Used  Substance and Sexual Activity   Alcohol use: No   Drug use: No   Sexual activity: Not on file  Other Topics Concern   Not on file  Social History Narrative   FROM [place], [place]    Lives in a 2 story apartment.  His cousin is currently staying with him.  Has 4 children.  On disability for the past 10 years for pulmonary  embolism, bipolar disease.  Education: high school.   Social Determinants of Health   Financial Resource Strain: Not on file  Food Insecurity: No Food Insecurity (07/17/2022)   Hunger Vital Sign    Worried About Running Out of Food in the Last Year: Never true    Ran Out of Food in the Last Year: Never true  Transportation Needs: No Transportation Needs (07/17/2022)   PRAPARE - Administrator, Civil Service (Medical): No    Lack of Transportation (Non-Medical): No  Physical Activity: Not on file  Stress: Not on file  Social Connections: Unknown (07/31/2021)   Received from [facility]   Social Network    Social Network: Not on file  Intimate Partner Violence: Not At Risk (07/17/2022)   Humiliation, Afraid, Rape, and Kick questionnaire    Fear of Current or Ex-Partner: No    Emotionally Abused: No    Physically Abused: No    Sexually Abused: No    FAMILY HISTORY: Family History  Problem Relation Age of Onset   Hypertension Mother  Stroke Mother    Multiple sclerosis Sister    Down syndrome Son     ALLERGIES:  is allergic to lyrica [pregabalin].  MEDICATIONS:  Current Outpatient Medications  Medication Sig Dispense Refill   carvedilol (COREG) 6.25 MG tablet Take 1 tablet (6.25 mg total) by mouth 2 (two) times daily. 60 tablet 11   dabigatran (PRADAXA) 150 MG CAPS capsule TAKE 1 CAPSULE(150 MG) BY MOUTH TWICE DAILY 180 capsule 3   doxepin (SINEQUAN) 75 MG capsule Take 75 mg by mouth at bedtime.     empagliflozin (JARDIANCE) 10 MG TABS tablet Take 1 tablet (10 mg total) by mouth daily before breakfast. 30 tablet 11   furosemide (LASIX) 40 MG tablet Take 1 tablet (40 mg total) by mouth as needed for fluid or edema (take if a weight gain of 3 lbs within 24 hours or 5 lbs in a week.). 90 tablet 3   mirtazapine (REMERON) 45 MG tablet Take 45 mg by mouth at bedtime.     potassium chloride SA (KLOR-CON M20) 20 MEQ tablet Take 2 tablets (40 mEq total) by mouth 2 (two)  times daily for 1 day, THEN 2 tablets (40 mEq total) daily. 64 tablet 0   QUEtiapine (SEROQUEL XR) 400 MG 24 hr tablet Take 800 mg by mouth at bedtime.     sacubitril-valsartan (ENTRESTO) 97-103 MG Take 1 tablet by mouth 2 (two) times daily. 60 tablet 11   spironolactone (ALDACTONE) 25 MG tablet Take 1 tablet (25 mg total) by mouth daily. (Patient taking differently: Take 12.5 mg by mouth daily.) 30 tablet 11   tiZANidine (ZANAFLEX) 4 MG tablet Take 4 mg by mouth 2 (two) times daily as needed for muscle spasms.     empagliflozin (JARDIANCE) 25 MG TABS tablet Take 25 mg by mouth daily.     HYDROcodone-acetaminophen (NORCO/VICODIN) 5-325 MG tablet Take 1 tablet by mouth every 6 (six) hours as needed for moderate pain or severe pain. (Patient not taking: Reported on 10/23/2022) 20 tablet 0   No current facility-administered medications for this visit.    REVIEW OF SYSTEMS:   Constitutional: ( - ) fevers, ( - )  chills , ( - ) night sweats Eyes: ( - ) blurriness of vision, ( - ) double vision, ( - ) watery eyes Ears, nose, mouth, throat, and face: ( - ) mucositis, ( - ) sore throat Respiratory: ( - ) cough, ( - ) dyspnea, ( - ) wheezes Cardiovascular: ( - ) palpitation, ( +) chest discomfort, ( + ) lower extremity swelling Gastrointestinal:  ( - ) nausea, ( - ) heartburn, ( - ) change in bowel habits Skin: ( - ) abnormal skin rashes Lymphatics: ( - ) new lymphadenopathy, ( - ) easy bruising Neurological: ( - ) numbness, ( - ) tingling, ( - ) new weaknesses Behavioral/Psych: ( - ) mood change, ( - ) new changes  All other systems were reviewed with the patient and are negative.  PHYSICAL EXAMINATION: ECOG PERFORMANCE STATUS: 1 - Symptomatic but completely ambulatory  There were no vitals filed for this visit.   There were no vitals filed for this visit.    GENERAL: well appearing male in NAD  SKIN: skin color, texture, turgor are normal, no rashes or significant lesions EYES: conjunctiva  are pink and non-injected, sclera clear NECK: supple, non-tender LUNGS: clear to auscultation and percussion with normal breathing effort HEART: regular rate & rhythm and no murmurs.  Musculoskeletal: no cyanosis of digits and  no clubbing  PSYCH: alert & oriented x 3, fluent speech NEURO: no focal motor/sensory deficits  LABORATORY DATA:  I have reviewed the data as listed    Latest Ref Rng & Units 10/23/2022    1:10 PM 07/31/2022   10:07 AM 07/17/2022    3:06 AM  CBC  WBC 4.0 - 10.5 K/uL 6.6   13.0   Hemoglobin 13.0 - 17.0 g/dL 16.1   09.6   Hematocrit 39.0 - 52.0 % 41.0  36.5  38.8   Platelets 150 - 400 K/uL 273   236        Latest Ref Rng & Units 10/05/2022    2:55 PM 09/19/2022    2:02 PM 09/05/2022    3:05 PM  CMP  Glucose 70 - 99 mg/dL 045  409  811   BUN 6 - 20 mg/dL 6  8  6    Creatinine 0.61 - 1.24 mg/dL 9.14  7.82  9.56   Sodium 135 - 145 mmol/L 138  138  139   Potassium 3.5 - 5.1 mmol/L 3.9  3.1  3.7   Chloride 98 - 111 mmol/L 103  102  106   CO2 22 - 32 mmol/L 26  29  25    Calcium 8.9 - 10.3 mg/dL 9.0  8.7  8.6    RADIOGRAPHIC STUDIES: I have personally reviewed the radiological images as listed and agreed with the findings in the report. No results found.  ASSESSMENT & PLAN Justin [name] is a 57 y.o. male who presents to the clinc for history of pulmonary embolism and recent acute DVT of right lower extremity. We reviewed provoking factors that can cause VTEs includin prolonged travel/immobility, surgery (particular abdominal or orthropedic), trauma,  and pregnancy/ estrogen containing birth control. After a detailed history and review of the records there is no clear provoking factor for this patient's VTE.  Patients with unprovoked VTEs have up to 25% recurrence after 5 years and 36% at 10 years, with 4% of these clots being fatal (BMJ [phone]). Therefore the formal recommendation for unprovoked VTE's is lifelong anticoagulation, as the cause may not be  transient or reversible.   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  --Since there is evidence of clot progression seen on 10/12/2021 while on Lovenox therapy, patient was switched to Coumadin.  --In September 2023, patient was found to have spontaneous large left thigh intramuscular hematoma in the setting of supratherapeutic INR greater than 7.  Coumadin was discontinued and patient was switched to Eliquis.  --Discussed that since patient failed Eliquis therapy in the past and unable to reach therapeutic dose with Coumadin, recommend Pradaxa 150 mg twice daily.   -- Based on the patient's beta-2 glycoprotein labs his findings are most consistent with antiphospholipid antibody syndrome.  Given that he only has beta-2 glycoprotein's positive and in no anticardiolipin's it should be appropriate to continue Pradaxa therapy.  (High risk APS requires Coumadin therapy). PLAN: --Labs today show white blood cell 6.6, hemoglobin 13.9, MCV 81.5, and platelets of 273 -- continue Pradaxa 150 mg twice daily --RTC in 6 months' time with strict return precautions for overt signs of bleeding.    No orders of the defined types were placed in this encounter.   All questions were answered. The patient knows to call  the clinic with any problems, questions or concerns.  I have spent a total of 25 minutes minutes of face-to-face and non-face-to-face time, preparing to see the patient,  performing a medically appropriate examination, counseling and educating the patient, ordering medications/test,  documenting clinical information in the electronic health record and care coordination.   Ulysees Barns, MD Department of Hematology/Oncology [facility] Phone: [phone] Pager: [phone] Email: [email or web address]

## 2022-10-24 ENCOUNTER — Telehealth: Payer: Self-pay | Admitting: Hematology and Oncology

## 2022-10-24 ENCOUNTER — Ambulatory Visit (HOSPITAL_BASED_OUTPATIENT_CLINIC_OR_DEPARTMENT_OTHER): Payer: MEDICAID | Admitting: Orthopaedic Surgery

## 2022-10-25 ENCOUNTER — Other Ambulatory Visit (HOSPITAL_COMMUNITY): Payer: Self-pay | Admitting: Family Medicine

## 2022-10-25 ENCOUNTER — Other Ambulatory Visit (HOSPITAL_COMMUNITY): Payer: Self-pay

## 2022-10-25 NOTE — Progress Notes (Signed)
Paramedicine Encounter    Patient ID: Justin [name], male    DOB: 1965/08/02, 57 y.o.   MRN: [id number]   Complaints-feeling more tired for past few days   Edema-none   Compliance with meds-reports he takes meds off and on   Pill box filled-starting pill box today If so, by whom-paramedic   Refills needed-doxepin, potassium, 10mg  jardiance, seroquel  Pt reports he is doing ok.  He is having his surgery to fix this battery on Tuesday so he can move forward to be able for him to get MRI to look into his back issues.  Breathing is doing ok. Denies c/p no dizziness. Appetite ok. He does report feeling full faster.  He is down 2 lbs from 2 wks ago.   Pt agreeable to pill box so I filled that up today.   Meds reviewed-he had his bottles to me today- he has a 25mg  jardiance bottle but shows he should be on 10mg . I called pharm and it was last filled in April but then his pain doc sent in 25mg  in June and he really hasn't been taking that. The past few visits he has not been able to produce all the pill bottles so I was not able to confirm some of these meds until today.  I called triage at [facility]F clinic and its agreeable for him to go back to the 10mg .   His doxepin is good thru today to sat pm.  Potassium is missing for next wed am dose.  Cleda Daub he was taking 1/2 due to the instructions on the bottle-whenever it was increased the pharm didn't fill it so he just followed what was on bottle.  He will p/u refills tomor.  London Pepper will have to be ordered per pharmacy and will be in tomor.   He has the pravastatin bottle but not really been taking it, he dont know when the last time he took it. Bottle is dated in June, he doesn't recall why he stopped taking it.  The last time his lipids were checked they were elevated, so I suggested him to start back taking it.   His HR on palpitation was slow, but with weaker irregular pulses but EKG showed 92 with regular complexes and EKG was same as it was  back in June. No changes noted.    BP 110/70   Pulse (!) 46   Resp 16   Wt 208 lb (94.3 kg)   SpO2 97%   BMI 30.72 kg/m  Weight yesterday-? Last visit weight-210  Patient Care Team: Pa, [facility] as PCP - General (Internal Medicine) Christell Constant, MD as PCP - Cardiology (Cardiology)  Patient Active Problem List   Diagnosis Date Noted   Pain in right leg 08/06/2022   Fatigue 07/31/2022   History of DVT (deep vein thrombosis) 07/17/2022   Tremors of nervous system 07/17/2022   Nonischemic cardiomyopathy (HCC) 05/18/2022   Chronic systolic CHF (congestive heart failure) (HCC) 05/18/2022   Snoring 04/04/2022   Normocytic anemia 12/22/2021   History of pulmonary embolism 10/19/2021   Status post lumbar spinal fusion 01/03/2018   Lumbar stenosis 12/23/2017   Spondylolisthesis, lumbar region 06/25/2017   Achilles tendinitis of left lower extremity 12/07/2016   Achilles tendinitis, left leg 05/17/2016   Obstructive sleep apnea on CPAP 03/07/2015   Achilles rupture, left 10/21/2014   Bipolar disorder, unspecified (HCC) 01/07/2013   Headache(784.0) 01/07/2013   Chronic anticoagulation 01/04/2013    Current Outpatient Medications:    carvedilol (  COREG) 6.25 MG tablet, Take 1 tablet (6.25 mg total) by mouth 2 (two) times daily., Disp: 60 tablet, Rfl: 11   dabigatran (PRADAXA) 150 MG CAPS capsule, TAKE 1 CAPSULE(150 MG) BY MOUTH TWICE DAILY, Disp: 180 capsule, Rfl: 3   doxepin (SINEQUAN) 75 MG capsule, Take 75 mg by mouth at bedtime., Disp: , Rfl:    empagliflozin (JARDIANCE) 10 MG TABS tablet, Take 1 tablet (10 mg total) by mouth daily before breakfast., Disp: 30 tablet, Rfl: 11   furosemide (LASIX) 40 MG tablet, Take 1 tablet (40 mg total) by mouth as needed for fluid or edema (take if a weight gain of 3 lbs within 24 hours or 5 lbs in a week.)., Disp: 90 tablet, Rfl: 3   mirtazapine (REMERON) 45 MG tablet, Take 45 mg by mouth at bedtime., Disp: , Rfl:    potassium  chloride SA (KLOR-CON M20) 20 MEQ tablet, Take 2 tablets (40 mEq total) by mouth 2 (two) times daily for 1 day, THEN 2 tablets (40 mEq total) daily., Disp: 64 tablet, Rfl: 0   pravastatin (PRAVACHOL) 10 MG tablet, Take 10 mg by mouth at bedtime., Disp: , Rfl:    QUEtiapine (SEROQUEL XR) 400 MG 24 hr tablet, Take 800 mg by mouth at bedtime., Disp: , Rfl:    sacubitril-valsartan (ENTRESTO) 97-103 MG, Take 1 tablet by mouth 2 (two) times daily., Disp: 60 tablet, Rfl: 11   spironolactone (ALDACTONE) 25 MG tablet, Take 1 tablet (25 mg total) by mouth daily., Disp: 30 tablet, Rfl: 11   tiZANidine (ZANAFLEX) 4 MG tablet, Take 4 mg by mouth 2 (two) times daily as needed for muscle spasms., Disp: , Rfl:    empagliflozin (JARDIANCE) 25 MG TABS tablet, Take 25 mg by mouth daily. (Patient not taking: Reported on 10/25/2022), Disp: , Rfl:    HYDROcodone-acetaminophen (NORCO/VICODIN) 5-325 MG tablet, Take 1 tablet by mouth every 6 (six) hours as needed for moderate pain or severe pain. (Patient not taking: Reported on 10/23/2022), Disp: 20 tablet, Rfl: 0 Allergies  Allergen Reactions   Lyrica [Pregabalin] Other (See Comments)    Hallucinations      Social History   Socioeconomic History   Marital status: Single    Spouse name: Not on file   Number of children: Not on file   Years of education: Not on file   Highest education level: Not on file  Occupational History   Occupation: disability  Tobacco Use   Smoking status: Never   Smokeless tobacco: Never  Vaping Use   Vaping status: Never Used  Substance and Sexual Activity   Alcohol use: No   Drug use: No   Sexual activity: Not on file  Other Topics Concern   Not on file  Social History Narrative   FROM [place], [place]    Lives in a 2 story apartment.  His cousin is currently staying with him.  Has 4 children.  On disability for the past 10 years for pulmonary embolism, bipolar disease.  Education: high school.   Social Determinants of Health    Financial Resource Strain: Not on file  Food Insecurity: No Food Insecurity (07/17/2022)   Hunger Vital Sign    Worried About Running Out of Food in the Last Year: Never true    Ran Out of Food in the Last Year: Never true  Transportation Needs: No Transportation Needs (07/17/2022)   PRAPARE - Administrator, Civil Service (Medical): No    Lack of Transportation (Non-Medical): No  Physical Activity: Not on file  Stress: Not on file  Social Connections: Unknown (07/31/2021)   Received from [facility]   Social Network    Social Network: Not on file  Intimate Partner Violence: Not At Risk (07/17/2022)   Humiliation, Afraid, Rape, and Kick questionnaire    Fear of Current or Ex-Partner: No    Emotionally Abused: No    Physically Abused: No    Sexually Abused: No    Physical Exam      Future Appointments  Date Time Provider Department Center  12/11/2022  2:00 PM [facility] ECHO OP 1 [facility]  12/11/2022  3:20 PM Laurey Morale, MD [facility] None  04/25/2023  2:30 PM [facility] None  04/25/2023  3:00 PM Jaci Standard, MD [facility] None       Kerry Hough, Paramedic [phone] [facility] Paramedic  10/25/22

## 2022-10-26 ENCOUNTER — Encounter (HOSPITAL_COMMUNITY): Payer: Self-pay | Admitting: Neurological Surgery

## 2022-10-26 ENCOUNTER — Other Ambulatory Visit: Payer: Self-pay

## 2022-10-26 NOTE — Anesthesia Preprocedure Evaluation (Signed)
Anesthesia Evaluation  Patient identified by MRN, date of birth, ID band Patient awake    Reviewed: Allergy & Precautions, NPO status , Patient's Chart, lab work & pertinent test results, reviewed documented beta blocker date and time   Airway Mallampati: I  TM Distance: >3 FB Neck ROM: Full   Comment: Previous grade I view with MAC 4, easy mask Dental  (+) Chipped, Missing, Dental Advisory Given, Edentulous Upper, Upper Dentures,    Pulmonary sleep apnea and Continuous Positive Airway Pressure Ventilation , PE (05/2007)   Pulmonary exam normal breath sounds clear to auscultation       Cardiovascular + CAD (non-obstructive), +CHF (EF 25-30%; on carvedilol, Jardiance, furosemide, Entresto, spironolactone) and + DVT  Normal cardiovascular exam+ dysrhythmias (prolonged QT) + Valvular Problems/Murmurs (mild) MR  Rhythm:Regular Rate:Normal  R/LHC 05/18/2022:   Mid LAD lesion is 20% stenosed.   1. Mild nonobstructive CAD.  2. Normal PCWP and RA pressure, mildly elevated LVEDP.  3. Preserved cardiac output.     Well-compensated nonischemic cardiomyopathy.  Will need cardiac MRI when battery is changed in his spinal stimulator to make it MRI compatible.   TTE 05/18/2022: IMPRESSIONS    1. No LV thrombus. Left ventricular ejection fraction, by estimation, is  25 to 30%. The left ventricle has severely decreased function. The left  ventricle demonstrates global hypokinesis. The left ventricular internal  cavity size was moderately dilated.   2. Right ventricular systolic function is mildly reduced. The right  ventricular size is normal.   3. Left atrial size was moderately dilated. No left atrial/left atrial  appendage thrombus was detected.   4. The mitral valve is normal in structure. Mild mitral valve  regurgitation, PISA ERO 0.11 cm^2. No systolic flow reversal in the  pulmonary vein systolic doppler pattern. No evidence of mitral  stenosis.   5. The aortic valve is tricuspid. Aortic valve regurgitation is not  visualized. No aortic stenosis is present.   6. No PFO or ASD by color doppler.      Neuro/Psych  Headaches PSYCHIATRIC DISORDERS   Bipolar Disorder   H/o SDH in setting of supratherapeutic INR in 2014  Has SCS for chronic pain  Neuromuscular disease (lumbar stenosis)    GI/Hepatic   Endo/Other    Renal/GU      Musculoskeletal   Abdominal   Peds  Hematology  (+) Blood dyscrasia, anemia Antiphospholipid antibody syndrome  Lab Results      Component                Value               Date                      WBC                      6.6                 10/23/2022                HGB                      13.9                10/23/2022                HCT  41.0                10/23/2022                MCV                      81.5                10/23/2022                PLT                      273                 10/23/2022              Anesthesia Other Findings Last Pradaxa: 8/12  Reproductive/Obstetrics                             Anesthesia Physical Anesthesia Plan  ASA: 4  Anesthesia Plan: General   Post-op Pain Management: Tylenol PO (pre-op)*   Induction: Intravenous  PONV Risk Score and Plan: 2 and Ondansetron, Dexamethasone and Treatment may vary due to age or medical condition  Airway Management Planned: Oral ETT  Additional Equipment:   Intra-op Plan:   Post-operative Plan: Extubation in OR  Informed Consent:      Dental advisory given  Plan Discussed with: CRNA and Anesthesiologist  Anesthesia Plan Comments: (Risks of general anesthesia discussed including, but not limited to, sore throat, hoarse voice, chipped/damaged teeth, injury to vocal cords, nausea and vomiting, allergic reactions, lung infection, heart attack, stroke, and death. All questions answered.   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  Recent right lower extremity DVT 09/23/2021 while on Eliquis.  He was switched back to Coumadin but developed spontaneous large left thigh intramuscular hematoma in the setting of supratherapeutic INR greater than 7.  He was back to Eliquis.  However, given previous failure and Eliquis he was ultimately placed on Pradaxa 150 mg twice daily.  Workup has been consistent with antiphospholipid antibody syndrome.  Last seen by Dr. Leonides Schanz 10/23/2022, recommend continue Pradaxa and follow-up in 6 months.  Follows cardiology for history of nonischemic cardiomyopathy/HFrEF and HTN. TEE 05/2022 showed EF 25 to 30%, moderate LV dilation, mildly decreased RV function, only mild MR.  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  Last seen by Prince Rome, FNP on 09/05/2022.  He was maintained on Jardiance, spironolactone, and Coreg.  His Entresto was increased.  He was prescribed Lasix 40 mg to use as needed.  Discussed that they would arrange for cardiac MRI after he had his spinal stimulator battery is changed out to an MRI compatible unit.  Patient reports last dose Pradaxa 10/28/2022.  CMP and CBC 10/23/2022 reviewed, creatinine elevated 1.72 (baseline appears to be ~1.4), otherwise unremarkable.  Patient will need to have surgery evaluation.  EKG 09/05/2022: NSR.  Rate 78.  Minimal voltage criteria for LVH, may be normal variant.  T wave abnormality, consider lateral ischemia.  Prolonged QT (QTcB 510)  Right/left heart cath 05/18/2022:    Mid LAD lesion is 20% stenosed.  1. Mild nonobstructive CAD.  2. Normal PCWP and RA pressure, mildly elevated LVEDP.  3. Preserved cardiac output.    Well-compensated nonischemic cardiomyopathy.  Will need cardiac MRI when battery is changed in his spinal stimulator to  make it MRI compatible.   TEE 05/18/2022: 1. No LV thrombus. Left ventricular ejection fraction, by estimation, is  25 to 30%. The left ventricle has  severely decreased function. The left  ventricle demonstrates global hypokinesis. The left ventricular internal  cavity size was moderately dilated.  2. Right ventricular systolic function is mildly reduced. The right  ventricular size is normal.  3. Left atrial size was moderately dilated. No left atrial/left atrial  appendage thrombus was detected.  4. The mitral valve is normal in structure. Mild mitral valve  regurgitation, PISA ERO 0.11 cm^2. No systolic flow reversal in the  pulmonary vein systolic doppler pattern. No evidence of mitral stenosis.  5. The aortic valve is tricuspid. Aortic valve regurgitation is not  visualized. No aortic stenosis is present.  6. No PFO or ASD by color doppler.   )        Anesthesia Quick Evaluation

## 2022-10-26 NOTE — Progress Notes (Signed)
Spoke with pt for pre-op call. Pt has hx of heart failure and his cardiologist is Dr. Izora Ribas. He denies hx of HTN or Diabetes. He has a hx of DVT and is on Pradaxa. He states he will hold it 1 day prior to surgery. Last dose will be Sunday 10/28/22. Pt instructed to hold the Jardiance 72 hours prior to surgery. His last dose will be today, 10/26/22.   Shower instructions given to pt and he voiced understanding.   Chart sent to PA for review

## 2022-10-26 NOTE — Progress Notes (Signed)
Anesthesia Chart Review: Same-day workup  Follows with hematology for history of recurrent unprovoked DVT/PE.  Recent right lower extremity DVT 09/23/2021 while on Eliquis.  He was switched back to Coumadin but developed spontaneous large left thigh intramuscular hematoma in the setting of supratherapeutic INR greater than 7.  He was back to Eliquis.  However, given previous failure and Eliquis he was ultimately placed on Pradaxa 150 mg twice daily.  Workup has been consistent with antiphospholipid antibody syndrome.  Last seen by Dr. Leonides Schanz 10/23/2022, recommend continue Pradaxa and follow-up in 6 months.  Follows cardiology for history of nonischemic cardiomyopathy/HFrEF and HTN. TEE 05/2022 showed EF 25 to 30%, moderate LV dilation, mildly decreased RV function, only mild MR.  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  Last seen by Prince Rome, FNP on 09/05/2022.  He was maintained on Jardiance, spironolactone, and Coreg.  His Entresto was increased.  He was prescribed Lasix 40 mg to use as needed.  Discussed that they would arrange for cardiac MRI after he had his spinal stimulator battery is changed out to an MRI compatible unit.  Patient reports last dose Pradaxa 10/28/2022.  CMP and CBC 10/23/2022 reviewed, creatinine elevated 1.72 (baseline appears to be ~1.4), otherwise unremarkable.  Patient will need to have surgery evaluation.  EKG 09/05/2022: NSR.  Rate 78.  Minimal voltage criteria for LVH, may be normal variant.  T wave abnormality, consider lateral ischemia.  Prolonged QT (QTcB 510)  Right/left heart cath 05/18/2022:    Mid LAD lesion is 20% stenosed.   1. Mild nonobstructive CAD.  2. Normal PCWP and RA pressure, mildly elevated LVEDP.  3. Preserved cardiac output.     Well-compensated nonischemic cardiomyopathy.  Will need cardiac MRI when battery is changed in his spinal stimulator to make it MRI compatible.   TEE 05/18/2022:  1. No LV thrombus. Left ventricular ejection fraction,  by estimation, is  25 to 30%. The left ventricle has severely decreased function. The left  ventricle demonstrates global hypokinesis. The left ventricular internal  cavity size was moderately dilated.   2. Right ventricular systolic function is mildly reduced. The right  ventricular size is normal.   3. Left atrial size was moderately dilated. No left atrial/left atrial  appendage thrombus was detected.   4. The mitral valve is normal in structure. Mild mitral valve  regurgitation, PISA ERO 0.11 cm^2. No systolic flow reversal in the  pulmonary vein systolic doppler pattern. No evidence of mitral stenosis.   5. The aortic valve is tricuspid. Aortic valve regurgitation is not  visualized. No aortic stenosis is present.   6. No PFO or ASD by color doppler.     Zannie [facility]/Anesthesiology Phone [phone] 10/26/2022 12:30 PM

## 2022-10-30 ENCOUNTER — Encounter (HOSPITAL_COMMUNITY): Payer: Self-pay | Admitting: Neurological Surgery

## 2022-10-30 ENCOUNTER — Ambulatory Visit (HOSPITAL_COMMUNITY): Payer: MEDICAID | Admitting: Physician Assistant

## 2022-10-30 ENCOUNTER — Ambulatory Visit (HOSPITAL_COMMUNITY)
Admission: RE | Admit: 2022-10-30 | Discharge: 2022-10-30 | Disposition: A | Discharge status: Home / Self Care | Payer: MEDICAID | Attending: Neurological Surgery | Admitting: Neurological Surgery

## 2022-10-30 ENCOUNTER — Encounter (HOSPITAL_COMMUNITY)
Admission: RE | Disposition: A | Discharge status: Home / Self Care | Payer: Self-pay | Source: Home / Self Care | Attending: Neurological Surgery

## 2022-10-30 DIAGNOSIS — I110 Hypertensive heart disease with heart failure: Secondary | ICD-10-CM | POA: Diagnosis not present

## 2022-10-30 DIAGNOSIS — Z79899 Other long term (current) drug therapy: Secondary | ICD-10-CM | POA: Diagnosis not present

## 2022-10-30 DIAGNOSIS — Z7901 Long term (current) use of anticoagulants: Secondary | ICD-10-CM | POA: Insufficient documentation

## 2022-10-30 DIAGNOSIS — T85113A Breakdown (mechanical) of implanted electronic neurostimulator, generator, initial encounter: Secondary | ICD-10-CM | POA: Diagnosis not present

## 2022-10-30 DIAGNOSIS — G4730 Sleep apnea, unspecified: Secondary | ICD-10-CM | POA: Insufficient documentation

## 2022-10-30 DIAGNOSIS — F319 Bipolar disorder, unspecified: Secondary | ICD-10-CM | POA: Insufficient documentation

## 2022-10-30 DIAGNOSIS — Z7984 Long term (current) use of oral hypoglycemic drugs: Secondary | ICD-10-CM | POA: Diagnosis not present

## 2022-10-30 DIAGNOSIS — Z86718 Personal history of other venous thrombosis and embolism: Secondary | ICD-10-CM | POA: Insufficient documentation

## 2022-10-30 DIAGNOSIS — Y752 Prosthetic and other implants, materials and neurological devices associated with adverse incidents: Secondary | ICD-10-CM | POA: Insufficient documentation

## 2022-10-30 DIAGNOSIS — I428 Other cardiomyopathies: Secondary | ICD-10-CM | POA: Insufficient documentation

## 2022-10-30 DIAGNOSIS — I5020 Unspecified systolic (congestive) heart failure: Secondary | ICD-10-CM | POA: Insufficient documentation

## 2022-10-30 HISTORY — PX: SPINAL CORD STIMULATOR REMOVAL: SHX5379

## 2022-10-30 HISTORY — DX: Heart failure, unspecified: I50.9

## 2022-10-30 LAB — SURGICAL PCR SCREEN
MRSA, PCR: NEGATIVE
Staphylococcus aureus: POSITIVE — AB

## 2022-10-30 SURGERY — LUMBAR SPINAL CORD STIMULATOR REMOVAL
Anesthesia: General

## 2022-10-30 MED ORDER — LIDOCAINE-EPINEPHRINE 1 %-1:100000 IJ SOLN
INTRAMUSCULAR | Status: AC
  Filled 2022-10-30: qty 1

## 2022-10-30 MED ORDER — BUPIVACAINE HCL (PF) 0.5 % IJ SOLN
INTRAMUSCULAR | Status: AC
  Filled 2022-10-30: qty 30

## 2022-10-30 MED ORDER — PROPOFOL 10 MG/ML IV BOLUS
INTRAVENOUS | Status: DC | PRN
  Administered 2022-10-30: 100 mg via INTRAVENOUS

## 2022-10-30 MED ORDER — OXYCODONE HCL 5 MG PO TABS: 5.0000 mg | ORAL_TABLET | Freq: Once | ORAL | Status: DC | PRN

## 2022-10-30 MED ORDER — MIDAZOLAM HCL 2 MG/2ML IJ SOLN
INTRAMUSCULAR | Status: AC
  Filled 2022-10-30: qty 2

## 2022-10-30 MED ORDER — PHENYLEPHRINE 80 MCG/ML (10ML) SYRINGE FOR IV PUSH (FOR BLOOD PRESSURE SUPPORT)
PREFILLED_SYRINGE | INTRAVENOUS | Status: AC
  Filled 2022-10-30: qty 10

## 2022-10-30 MED ORDER — EPHEDRINE 5 MG/ML INJ
INTRAVENOUS | Status: AC
  Filled 2022-10-30: qty 5

## 2022-10-30 MED ORDER — PHENYLEPHRINE HCL-NACL 20-0.9 MG/250ML-% IV SOLN
INTRAVENOUS | Status: DC | PRN
  Administered 2022-10-30: 40 ug/min via INTRAVENOUS

## 2022-10-30 MED ORDER — EPHEDRINE SULFATE-NACL 50-0.9 MG/10ML-% IV SOSY
PREFILLED_SYRINGE | INTRAVENOUS | Status: DC | PRN
  Administered 2022-10-30: 5 mg via INTRAVENOUS
  Administered 2022-10-30 (×2): 10 mg via INTRAVENOUS

## 2022-10-30 MED ORDER — LIDOCAINE-EPINEPHRINE 1 %-1:100000 IJ SOLN
INTRAMUSCULAR | Status: DC | PRN
  Administered 2022-10-30: 10 mL

## 2022-10-30 MED ORDER — LIDOCAINE 2% (20 MG/ML) 5 ML SYRINGE
INTRAMUSCULAR | Status: DC | PRN
  Administered 2022-10-30: 100 mg via INTRAVENOUS

## 2022-10-30 MED ORDER — AMISULPRIDE (ANTIEMETIC) 5 MG/2ML IV SOLN: 10.0000 mg | Freq: Once | INTRAVENOUS | Status: DC | PRN

## 2022-10-30 MED ORDER — MIDAZOLAM HCL 2 MG/2ML IJ SOLN
INTRAMUSCULAR | Status: DC | PRN
  Administered 2022-10-30: 2 mg via INTRAVENOUS

## 2022-10-30 MED ORDER — LIDOCAINE 2% (20 MG/ML) 5 ML SYRINGE
INTRAMUSCULAR | Status: AC
  Filled 2022-10-30: qty 5

## 2022-10-30 MED ORDER — CEFAZOLIN SODIUM-DEXTROSE 2-4 GM/100ML-% IV SOLN
2.0000 g | INTRAVENOUS | Status: AC
  Administered 2022-10-30: 2 g via INTRAVENOUS
  Filled 2022-10-30: qty 100

## 2022-10-30 MED ORDER — OXYCODONE HCL 5 MG/5ML PO SOLN: 5.0000 mg | Freq: Once | ORAL | Status: DC | PRN

## 2022-10-30 MED ORDER — ROCURONIUM BROMIDE 10 MG/ML (PF) SYRINGE
PREFILLED_SYRINGE | INTRAVENOUS | Status: AC
  Filled 2022-10-30: qty 10

## 2022-10-30 MED ORDER — PROPOFOL 10 MG/ML IV BOLUS
INTRAVENOUS | Status: AC
  Filled 2022-10-30: qty 20

## 2022-10-30 MED ORDER — PHENYLEPHRINE 80 MCG/ML (10ML) SYRINGE FOR IV PUSH (FOR BLOOD PRESSURE SUPPORT)
PREFILLED_SYRINGE | INTRAVENOUS | Status: DC | PRN
  Administered 2022-10-30 (×4): 160 ug via INTRAVENOUS
  Administered 2022-10-30: 80 ug via INTRAVENOUS

## 2022-10-30 MED ORDER — CHLORHEXIDINE GLUCONATE 0.12 % MT SOLN
15.0000 mL | OROMUCOSAL | Status: AC
  Filled 2022-10-30: qty 15

## 2022-10-30 MED ORDER — BACITRACIN ZINC 500 UNIT/GM EX OINT
TOPICAL_OINTMENT | CUTANEOUS | Status: AC
  Filled 2022-10-30: qty 28.35

## 2022-10-30 MED ORDER — ROCURONIUM BROMIDE 10 MG/ML (PF) SYRINGE
PREFILLED_SYRINGE | INTRAVENOUS | Status: DC | PRN
  Administered 2022-10-30: 20 mg via INTRAVENOUS
  Administered 2022-10-30: 60 mg via INTRAVENOUS

## 2022-10-30 MED ORDER — CHLORHEXIDINE GLUCONATE 0.12 % MT SOLN
OROMUCOSAL | Status: AC
  Administered 2022-10-30: 15 mL via OROMUCOSAL
  Filled 2022-10-30: qty 15

## 2022-10-30 MED ORDER — CHLORHEXIDINE GLUCONATE CLOTH 2 % EX PADS: 6.0000 | MEDICATED_PAD | Freq: Once | CUTANEOUS | Status: DC

## 2022-10-30 MED ORDER — SUGAMMADEX SODIUM 200 MG/2ML IV SOLN
INTRAVENOUS | Status: DC | PRN
  Administered 2022-10-30: 200 mg via INTRAVENOUS

## 2022-10-30 MED ORDER — FENTANYL CITRATE (PF) 250 MCG/5ML IJ SOLN
INTRAMUSCULAR | Status: AC
  Filled 2022-10-30: qty 5

## 2022-10-30 MED ORDER — FENTANYL CITRATE (PF) 100 MCG/2ML IJ SOLN: 25.0000 ug | INTRAMUSCULAR | Status: DC | PRN

## 2022-10-30 MED ORDER — FENTANYL CITRATE (PF) 250 MCG/5ML IJ SOLN
INTRAMUSCULAR | Status: DC | PRN
  Administered 2022-10-30: 100 ug via INTRAVENOUS
  Administered 2022-10-30: 50 ug via INTRAVENOUS

## 2022-10-30 MED ORDER — ONDANSETRON HCL 4 MG/2ML IJ SOLN
INTRAMUSCULAR | Status: DC | PRN
  Administered 2022-10-30: 4 mg via INTRAVENOUS

## 2022-10-30 MED ORDER — DEXAMETHASONE SODIUM PHOSPHATE 10 MG/ML IJ SOLN
INTRAMUSCULAR | Status: DC | PRN
  Administered 2022-10-30: 10 mg via INTRAVENOUS

## 2022-10-30 MED ORDER — ACETAMINOPHEN 500 MG PO TABS
1000.0000 mg | ORAL_TABLET | Freq: Once | ORAL | Status: AC
  Administered 2022-10-30: 1000 mg via ORAL
  Filled 2022-10-30: qty 2

## 2022-10-30 MED ORDER — LACTATED RINGERS IV SOLN: INTRAVENOUS | Status: DC | PRN

## 2022-10-30 SURGICAL SUPPLY — 46 items
ADH SKN CLS APL DERMABOND .7 (GAUZE/BANDAGES/DRESSINGS) ×1
APL SKNCLS STERI-STRIP NONHPOA (GAUZE/BANDAGES/DRESSINGS)
BAG COUNTER SPONGE SURGICOUNT (BAG) ×1 IMPLANT
BAG SPNG CNTER NS LX DISP (BAG) ×1
BENZOIN TINCTURE PRP APPL 2/3 (GAUZE/BANDAGES/DRESSINGS) IMPLANT
BLADE CLIPPER SURG (BLADE) IMPLANT
CONTROL REMOTE FREELINK ALPHA (NEUROSURGERY SUPPLIES) IMPLANT
DERMABOND ADVANCED .7 DNX12 (GAUZE/BANDAGES/DRESSINGS) IMPLANT
DRAPE LAPAROTOMY 100X72X124 (DRAPES) ×1 IMPLANT
DURAPREP 26ML APPLICATOR (WOUND CARE) ×1 IMPLANT
ELECT REM PT RETURN 9FT ADLT (ELECTROSURGICAL) ×1
ELECTRODE REM PT RTRN 9FT ADLT (ELECTROSURGICAL) ×1 IMPLANT
GAUZE 4X4 16PLY ~~LOC~~+RFID DBL (SPONGE) IMPLANT
GLOVE BIOGEL PI IND STRL 8.5 (GLOVE) ×1 IMPLANT
GLOVE ECLIPSE 8.5 STRL (GLOVE) ×1 IMPLANT
GLOVE EXAM NITRILE XL STR (GLOVE) IMPLANT
GOWN STRL REUS W/ TWL LRG LVL3 (GOWN DISPOSABLE) IMPLANT
GOWN STRL REUS W/ TWL XL LVL3 (GOWN DISPOSABLE) ×1 IMPLANT
GOWN STRL REUS W/TWL 2XL LVL3 (GOWN DISPOSABLE) ×1 IMPLANT
GOWN STRL REUS W/TWL LRG LVL3 (GOWN DISPOSABLE)
GOWN STRL REUS W/TWL XL LVL3 (GOWN DISPOSABLE) ×1
KIT BASIN OR (CUSTOM PROCEDURE TRAY) ×1 IMPLANT
KIT CHARGING PRECISION NEURO (KITS) IMPLANT
KIT IPG ALPHA WAVEWRITER (Stimulator) IMPLANT
KIT TURNOVER KIT B (KITS) ×1 IMPLANT
NDL HYPO 22X1.5 SAFETY MO (MISCELLANEOUS) ×1 IMPLANT
NEEDLE HYPO 22X1.5 SAFETY MO (MISCELLANEOUS) ×1 IMPLANT
NS IRRIG 1000ML POUR BTL (IV SOLUTION) ×1 IMPLANT
PACK LAMINECTOMY NEURO (CUSTOM PROCEDURE TRAY) ×1 IMPLANT
PAD ARMBOARD 7.5X6 YLW CONV (MISCELLANEOUS) ×1 IMPLANT
SPIKE FLUID TRANSFER (MISCELLANEOUS) ×1 IMPLANT
SPONGE SURGIFOAM ABS GEL SZ50 (HEMOSTASIS) ×1 IMPLANT
SPONGE T-LAP 4X18 ~~LOC~~+RFID (SPONGE) IMPLANT
STAPLER SKIN PROX WIDE 3.9 (STAPLE) IMPLANT
STRIP CLOSURE SKIN 1/2X4 (GAUZE/BANDAGES/DRESSINGS) IMPLANT
SUT ETHILON 4 0 PS 2 18 (SUTURE) IMPLANT
SUT SILK 0 TIES 10X30 (SUTURE) IMPLANT
SUT VIC AB 0 CT1 18XCR BRD8 (SUTURE) ×1 IMPLANT
SUT VIC AB 0 CT1 8-18 (SUTURE) ×1
SUT VIC AB 2-0 CP2 18 (SUTURE) ×1 IMPLANT
SUT VIC AB 3-0 SH 8-18 (SUTURE) ×1 IMPLANT
SUT VIC AB 4-0 RB1 18 (SUTURE) ×1 IMPLANT
SYR CONTROL 10ML LL (SYRINGE) ×1 IMPLANT
TOWEL GREEN STERILE (TOWEL DISPOSABLE) ×1 IMPLANT
TOWEL GREEN STERILE FF (TOWEL DISPOSABLE) ×1 IMPLANT
WATER STERILE IRR 1000ML POUR (IV SOLUTION) ×1 IMPLANT

## 2022-10-30 NOTE — Anesthesia Postprocedure Evaluation (Signed)
Anesthesia Post Note  Patient: Justin [name]  Procedure(s) Performed: SCS REVISION OF GENERATOR     Patient location during evaluation: PACU Anesthesia Type: General Level of consciousness: awake Pain management: pain level controlled Vital Signs Assessment: post-procedure vital signs reviewed and stable Respiratory status: spontaneous breathing, nonlabored ventilation and respiratory function stable Cardiovascular status: blood pressure returned to baseline and stable Postop Assessment: no apparent nausea or vomiting Anesthetic complications: no   No notable events documented.  Last Vitals:  Vitals:   10/30/22 1645 10/30/22 1700  BP: 126/80 123/86  Pulse: 81 74  Resp: 15 13  Temp:  (!) 36.3 C  SpO2: 99% 97%    Last Pain:  Vitals:   10/30/22 1618  TempSrc:   PainSc: Asleep                 Linton Rump

## 2022-10-30 NOTE — Op Note (Signed)
Date of surgery: 10/30/2022 Preoperative diagnosis: Nonfunctioning spinal cord stimulator generator Postoperative diagnosis: Same Procedure: Replacement of spinal cord stimulator battery and generator. Surgeon: Barnett Abu Anesthesia: General Endotracheal Indications: Justin [name] is a 57 year old individual who has had good relief from a functioning spinal cord stimulator.  The stimulator appears to be failing and the patient also needs to undergo MRI study.  Advised that he have the generator and battery replaced with an RIA compatible generator.  Procedure the patient was brought to the operating room supine on the stretcher.  After the smooth induction of general tracheal anesthesia he was carefully turned prone on parallel rolls.  The back was prepped with alcohol DuraPrep and draped in a sterile fashion over the area of the generator implant and also the site of his laminectomy for placement of the paddle electrode.  An elliptical incision was made around his previous scar and this was excised.  Car was noted to be rather thick and ropey.  Cutaneous tissues was dissected until pouch was reached and this was noted to be a very fibrous encapsulation for the spinal cord generator.  This was carefully dissected with care being taken to protect the area of the lead entry into the generator.  The pocket was extremely fibrous and it required cautery and dissection purply in order to open the pouch adequately enough to mobilized the generator.  Ultimately was mobilized so that it could be removed however the area around the lead insertion was carefully dissected last so as not to put any tension on the electrodes.  The electrodes were then loosened from the generator and removed.  The electrodes could then be removed through subfascial tissue into a clear space.  The capsule was noted to be very densely adherent to the generator that was present and the capsule was then removed partially on the superior aspect  of her was very thick and dense removal of part the capsule required sharp dissection removing the capsular wall into the subcutaneous tissue.  Once the subcutaneous pocket was felt to be large enough and adequate enough for placement of the new generator leads were cleaned and connected to the new generator.  Impedances were checked and it was noted that all the leads were in continuity.  With this the generator was placed into the subcutaneous pocket.  3-0 Vicryl was used to close the fascia over the pocket and then 3 and 4-0 Vicryl was used to close the subcuticular skin.  A running 4-0 nylon suture was used to close the final layer.  Bacitracin ointment with a dry sterile dressing was placed over this area.  Blood loss was nil.

## 2022-10-30 NOTE — Anesthesia Procedure Notes (Signed)
Procedure Name: Intubation Date/Time: 10/30/2022 2:57 PM  Performed by: Linton Rump, MDPre-anesthesia Checklist: Patient identified, Emergency Drugs available, Suction available and Patient being monitored Patient Re-evaluated:Patient Re-evaluated prior to induction Oxygen Delivery Method: Circle system utilized Preoxygenation: Pre-oxygenation with 100% oxygen Induction Type: IV induction Ventilation: Mask ventilation without difficulty Laryngoscope Size: Mac and 4 Grade View: Grade II Tube type: Oral Tube size: 7.5 mm Number of attempts: 1 Airway Equipment and Method: Stylet and Oral airway Placement Confirmation: ETT inserted through vocal cords under direct vision, positive ETCO2 and breath sounds checked- equal and bilateral Secured at: 23 cm Tube secured with: Tape Dental Injury: Teeth and Oropharynx as per pre-operative assessment

## 2022-10-30 NOTE — H&P (Signed)
Justin [name] is an 57 y.o. male.   Chief Complaint: Failure of spinal cord stimulator HPI: Patient is a 57 year old individual whose had significant back pain that has been mitigated with the use of a spinal cord stimulator.  Also has a significant cardiac history and is been advised that he should undergo MRI of his cardiac area however because of his spinal cord stimulator he is not been able to undergo this.  Patient notes that he does get relief from the use of the spinal cord stimulator however recently is been noted that the battery is failing.  He has been advised that the battery will need to be changed and he desires to have this done with an MRI compatible spinal cord stimulator.  He is now admitted for that procedure.  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    Past Surgical History:  Procedure Laterality Date   ACHILLES TENDON SURGERY Left 10/21/2014   Procedure: Left Achilles Reconstruction;  Surgeon: Nadara Mustard, MD;  Location: [facility] OR;  Service: Orthopedics;  Laterality: Left;   APPENDECTOMY     CARDIAC CATHETERIZATION  03/04/2018   [facility]: Normal coronaries, LVEF estimated at 40%, medical Rx   CRANIOTOMY N/A 01/19/2013   Procedure: CRANIOTOMY HEMATOMA EVACUATION SUBDURAL;  Surgeon: Hewitt Shorts, MD;  Location: [facility] NEURO ORS;  Service: Neurosurgery;  Laterality: N/A;   CYSTECTOMY     right head   ELBOW SURGERY     right   FRACTURE SURGERY     finger   I & D EXTREMITY Left 12/07/2016   Procedure: LEFT ACHILLES  DEBRIDEMENT;  Surgeon: Nadara Mustard, MD;  Location: [facility] OR;  Service: Orthopedics;  Laterality: Left;   LUMBAR FUSION  12/23/2017   L5 GILL PROCEDURE, RIGHT L5-S1, TRANSFORAMIAL LUMBAR INTERBODY FUSION, BILATERAL LATERAL FUSION, PEDICLE INSTRUMENTATION   MULTIPLE EXTRACTIONS WITH ALVEOLOPLASTY N/A 03/07/2015   Procedure: MULTIPLE EXTRACTION WITH ALVEOLOPLASTY;  Surgeon: [name], DDS;  Location: [facility] OR;  Service: Oral Surgery;  Laterality: N/A;   RIGHT/LEFT HEART CATH AND CORONARY ANGIOGRAPHY N/A 05/18/2022   Procedure: RIGHT/LEFT HEART CATH AND CORONARY ANGIOGRAPHY;  Surgeon: Laurey Morale, MD;  Location: [facility] INVASIVE CV LAB;  Service: Cardiovascular;  Laterality: N/A;   SPINAL CORD STIMULATOR INSERTION N/A 05/18/2019   Procedure: LUMBAR SPINAL CORD STIMULATOR INSERTION;  Surgeon: Odette Fraction, MD;  Location: [facility] OR;  Service: Neurosurgery;  Laterality: N/A;  Thoracic/Lumbar   SPINAL CORD STIMULATOR INSERTION N/A 04/06/2020   Procedure: Revision of spinal cord stimulator;  Surgeon: Renaldo Fiddler, MD;  Location: [facility] OR;  Service: Neurosurgery;  Laterality: N/A;   TEE WITHOUT CARDIOVERSION N/A 05/18/2022   Procedure: TRANSESOPHAGEAL ECHOCARDIOGRAM (TEE);  Surgeon: Laurey Morale, MD;  Location: [facility] ENDOSCOPY;  Service: Cardiovascular;  Laterality: N/A;    Family History  Problem Relation Age of Onset   Hypertension Mother    Stroke Mother    Multiple sclerosis Sister    Down syndrome Son    Social History:  reports that he has never smoked. He has been exposed to tobacco smoke. He has never used smokeless tobacco. He reports that he does not drink alcohol and does not use drugs.  Allergies:  Allergies  Allergen Reactions   Lyrica [Pregabalin] Other (See Comments)    Hallucinations    No medications prior to admission.    No results found for this or any previous visit (from the past 48 hour(s)). No results found.  Review of Systems  Musculoskeletal:  Positive for back pain and  myalgias.  All other systems reviewed and are negative.   There were no vitals taken for this visit. Physical Exam Constitutional:      Appearance: Normal appearance.  HENT:     Head: Normocephalic and atraumatic.     Right Ear: Tympanic membrane, ear canal and external ear normal.     Left Ear: Tympanic membrane, ear canal and external ear normal.     Nose: Nose normal.     Mouth/Throat:     Mouth: Mucous membranes are dry.     Pharynx: Oropharynx is clear.  Eyes:     Extraocular Movements: Extraocular movements intact.     Conjunctiva/sclera: Conjunctivae normal.     Pupils: Pupils are equal, round, and reactive to light.  Cardiovascular:     Rate and Rhythm: Normal rate and regular rhythm.     Pulses: Normal pulses.  Pulmonary:     Effort: Pulmonary effort is normal.     Breath sounds: Normal breath sounds.  Abdominal:     General: Abdomen is flat. Bowel sounds are normal.     Palpations: Abdomen is soft.  Musculoskeletal:        General: Normal range of motion.     Cervical back: Normal range of motion and neck supple.  Skin:    General: Skin is warm and dry.     Capillary Refill: Capillary refill takes less than 2 seconds.  Neurological:     General: No focal deficit present.     Mental Status: He is alert.  Psychiatric:        Mood and Affect: Mood normal.        Behavior: Behavior normal.        Thought Content: Thought content normal.        Judgment: Judgment normal.      Assessment/Plan Failure of spinal cord stimulator generator.  Plan replacement of spinal cord stimulator generator with MRI compatible spinal cord stimulator generator  Stefani Dama, MD 10/30/2022, 7:53 AM

## 2022-10-30 NOTE — Transfer of Care (Signed)
Immediate Anesthesia Transfer of Care Note  Patient: Justin [name]  Procedure(s) Performed: SCS REVISION OF GENERATOR  Patient Location: PACU  Anesthesia Type:General  Level of Consciousness: awake  Airway & Oxygen Therapy: Patient Spontanous Breathing and Patient connected to face mask oxygen  Post-op Assessment: Report given to RN and Post -op Vital signs reviewed and stable  Post vital signs: Reviewed and stable  Last Vitals:  Vitals Value Taken Time  BP 133/91 10/30/22 1617  Temp    Pulse 93 10/30/22 1619  Resp 14 10/30/22 1619  SpO2 97 % 10/30/22 1619  Vitals shown include unfiled device data.  Last Pain:  Vitals:   10/30/22 1103  TempSrc:   PainSc: 8       Patients Stated Pain Goal: 5 (10/30/22 1103)  Complications: No notable events documented.

## 2022-10-30 NOTE — Discharge Summary (Signed)
Physician Discharge Summary  Patient ID: Justin [name] MRN: [id number] DOB/AGE: 1966/03/02 57 y.o.  Admit date: 10/30/2022 Discharge date: 10/30/2022  Admission Diagnoses: Nonfunctioning spinal cord stimulator  Discharge Diagnoses: Nonfunctioning spinal cord stimulator Active Problems:   * No active hospital problems. *   Discharged Condition: good  Hospital Course: Patient was admitted to undergo revision of a spinal cord stimulator which she tolerated without problem.  Consults: None  Significant Diagnostic Studies: None  Treatments: surgery: See op note  Discharge Exam: Blood pressure 120/79, pulse 83, temperature 98.2 F (36.8 C), temperature source Oral, resp. rate 18, height 5\' 9"  (1.753 m), weight 97.5 kg, SpO2 97%. Incision is clean and dry Station and gait are intact.  Disposition: Discharge disposition: 01-Home or Self Care       Discharge Instructions     Call MD for:  redness, tenderness, or signs of infection (pain, swelling, redness, odor or [place]/yellow discharge around incision site)   Complete by: As directed    Call MD for:  severe uncontrolled pain   Complete by: As directed    Call MD for:  temperature >100.4   Complete by: As directed    Diet - low sodium heart healthy   Complete by: As directed    Discharge instructions   Complete by: As directed    Return in 10 days for suture removal   Increase activity slowly   Complete by: As directed       Allergies as of 10/30/2022       Reactions   Lyrica [pregabalin] Other (See Comments)   Hallucinations        Medication List     TAKE these medications    carvedilol 6.25 MG tablet Commonly known as: Coreg Take 1 tablet (6.25 mg total) by mouth 2 (two) times daily.   dabigatran 150 MG Caps capsule Commonly known as: PRADAXA TAKE 1 CAPSULE(150 MG) BY MOUTH TWICE DAILY   doxepin 75 MG capsule Commonly known as: SINEQUAN Take 75 mg by mouth at bedtime.   Jardiance 25 MG Tabs  tablet Generic drug: empagliflozin Take 25 mg by mouth daily.   empagliflozin 10 MG Tabs tablet Commonly known as: Jardiance Take 1 tablet (10 mg total) by mouth daily before breakfast.   Entresto 97-103 MG Generic drug: sacubitril-valsartan Take 1 tablet by mouth 2 (two) times daily.   furosemide 40 MG tablet Commonly known as: LASIX Take 1 tablet (40 mg total) by mouth as needed for fluid or edema (take if a weight gain of 3 lbs within 24 hours or 5 lbs in a week.).   HYDROcodone-acetaminophen 5-325 MG tablet Commonly known as: NORCO/VICODIN Take 1 tablet by mouth every 6 (six) hours as needed for moderate pain or severe pain.   mirtazapine 45 MG tablet Commonly known as: REMERON Take 45 mg by mouth at bedtime.   potassium chloride SA 20 MEQ tablet Commonly known as: KLOR-CON M TAKE 2 TABLETS(40 MEQ) BY MOUTH TWICE DAILY FOR 1 DAY THEN TAKE 2 TABLETS(40 MEQ) BY MOUTH DAILY   pravastatin 10 MG tablet Commonly known as: PRAVACHOL Take 10 mg by mouth at bedtime.   QUEtiapine 400 MG 24 hr tablet Commonly known as: SEROQUEL XR Take 800 mg by mouth at bedtime.   spironolactone 25 MG tablet Commonly known as: Aldactone Take 1 tablet (25 mg total) by mouth daily.   tiZANidine 4 MG tablet Commonly known as: ZANAFLEX Take 4 mg by mouth 2 (two) times daily as needed for  muscle spasms.         Signed: Shary Key  10/30/2022, 4:16 PM

## 2022-10-30 NOTE — Interval H&P Note (Signed)
History and Physical Interval Note:  10/30/2022 2:14 PM  Justin [name]  has presented today for surgery, with the diagnosis of CHRONIC PAIN SYNDROME.  The various methods of treatment have been discussed with the patient and family. After consideration of risks, benefits and other options for treatment, the patient has consented to  Procedure(s) with comments: SCS REVISION OF GENERATOR (N/A) - 3C as a surgical intervention.  The patient's history has been reviewed, patient examined, no change in status, stable for surgery.  I have reviewed the patient's chart and labs.  Questions were answered to the patient's satisfaction.     Stefani Dama

## 2022-11-01 ENCOUNTER — Other Ambulatory Visit (HOSPITAL_COMMUNITY): Payer: Self-pay

## 2022-11-01 NOTE — Progress Notes (Signed)
Paramedicine Encounter    Patient ID: Justin [name], male    DOB: Sep 08, 1965, 57 y.o.   MRN: [id number]   Complaints-back pain from recent surgery   Edema-none  Compliance with meds-?  Pill box filled-he filled up AM dose  If so, by whom-I filled pm dose and checked am pills   He filled up am meds---there is still 2 of the 49-51 in there-I thought we had finished those bottles last week   Does not have the pravastatin bottle.  Refills [phone] Will get these called in next week.    Pt had his back surgery to fix the battery to that device so now he can have the scans needed. He does have pain with that, he said he called into the office to see if they can send something in for him.   He denies dizziness, c/p. No edema noted. He said when he does bend over for a minute and then get back up he does feel light headed  Meds verified and pill box refilled.  BP 112/88   Pulse 95   Resp 16   SpO2 97%  Weight yesterday-? Last visit weight-208  Patient Care Team: Pa, [facility] as PCP - General (Internal Medicine) Christell Constant, MD as PCP - Cardiology (Cardiology)  Patient Active Problem List   Diagnosis Date Noted   Pain in right leg 08/06/2022   Fatigue 07/31/2022   History of DVT (deep vein thrombosis) 07/17/2022   Tremors of nervous system 07/17/2022   Nonischemic cardiomyopathy (HCC) 05/18/2022   Chronic systolic CHF (congestive heart failure) (HCC) 05/18/2022   Snoring 04/04/2022   Normocytic anemia 12/22/2021   History of pulmonary embolism 10/19/2021   Status post lumbar spinal fusion 01/03/2018   Lumbar stenosis 12/23/2017   Spondylolisthesis, lumbar region 06/25/2017   Achilles tendinitis of left lower extremity 12/07/2016   Achilles tendinitis, left leg 05/17/2016   Obstructive sleep apnea on CPAP 03/07/2015   Achilles rupture, left 10/21/2014   Bipolar disorder, unspecified (HCC) 01/07/2013   Headache(784.0) 01/07/2013   Chronic  anticoagulation 01/04/2013    Current Outpatient Medications:    carvedilol (COREG) 6.25 MG tablet, Take 1 tablet (6.25 mg total) by mouth 2 (two) times daily., Disp: 60 tablet, Rfl: 11   dabigatran (PRADAXA) 150 MG CAPS capsule, TAKE 1 CAPSULE(150 MG) BY MOUTH TWICE DAILY, Disp: 180 capsule, Rfl: 3   doxepin (SINEQUAN) 75 MG capsule, Take 75 mg by mouth at bedtime., Disp: , Rfl:    empagliflozin (JARDIANCE) 10 MG TABS tablet, Take 1 tablet (10 mg total) by mouth daily before breakfast., Disp: 30 tablet, Rfl: 11   furosemide (LASIX) 40 MG tablet, Take 1 tablet (40 mg total) by mouth as needed for fluid or edema (take if a weight gain of 3 lbs within 24 hours or 5 lbs in a week.)., Disp: 90 tablet, Rfl: 3   mirtazapine (REMERON) 45 MG tablet, Take 45 mg by mouth at bedtime., Disp: , Rfl:    potassium chloride SA (KLOR-CON M) 20 MEQ tablet, TAKE 2 TABLETS(40 MEQ) BY MOUTH TWICE DAILY FOR 1 DAY THEN TAKE 2 TABLETS(40 MEQ) BY MOUTH DAILY, Disp: 64 tablet, Rfl: 0   pravastatin (PRAVACHOL) 10 MG tablet, Take 10 mg by mouth at bedtime., Disp: , Rfl:    QUEtiapine (SEROQUEL XR) 400 MG 24 hr tablet, Take 800 mg by mouth at bedtime., Disp: , Rfl:    sacubitril-valsartan (ENTRESTO) 97-103 MG, Take 1 tablet by mouth 2 (two) times daily.,  Disp: 60 tablet, Rfl: 11   spironolactone (ALDACTONE) 25 MG tablet, Take 1 tablet (25 mg total) by mouth daily., Disp: 30 tablet, Rfl: 11   tiZANidine (ZANAFLEX) 4 MG tablet, Take 4 mg by mouth 2 (two) times daily as needed for muscle spasms., Disp: , Rfl:    empagliflozin (JARDIANCE) 25 MG TABS tablet, Take 25 mg by mouth daily. (Patient not taking: Reported on 11/01/2022), Disp: , Rfl:    HYDROcodone-acetaminophen (NORCO/VICODIN) 5-325 MG tablet, Take 1 tablet by mouth every 6 (six) hours as needed for moderate pain or severe pain., Disp: 20 tablet, Rfl: 0 Allergies  Allergen Reactions   Lyrica [Pregabalin] Other (See Comments)    Hallucinations      Social History    Socioeconomic History   Marital status: Single    Spouse name: Not on file   Number of children: Not on file   Years of education: Not on file   Highest education level: Not on file  Occupational History   Occupation: disability  Tobacco Use   Smoking status: Never    Passive exposure: Past   Smokeless tobacco: Never  Vaping Use   Vaping status: Never Used  Substance and Sexual Activity   Alcohol use: No   Drug use: No   Sexual activity: Not on file  Other Topics Concern   Not on file  Social History Narrative   FROM [place], [place]    Lives in a 2 story apartment.  His cousin is currently staying with him.  Has 4 children.  On disability for the past 10 years for pulmonary embolism, bipolar disease.  Education: high school.   Social Determinants of Health   Financial Resource Strain: Not on file  Food Insecurity: No Food Insecurity (07/17/2022)   Hunger Vital Sign    Worried About Running Out of Food in the Last Year: Never true    Ran Out of Food in the Last Year: Never true  Transportation Needs: No Transportation Needs (07/17/2022)   PRAPARE - Administrator, Civil Service (Medical): No    Lack of Transportation (Non-Medical): No  Physical Activity: Not on file  Stress: Not on file  Social Connections: Unknown (07/31/2021)   Received from [facility]   Social Network    Social Network: Not on file  Intimate Partner Violence: Not At Risk (07/17/2022)   Humiliation, Afraid, Rape, and Kick questionnaire    Fear of Current or Ex-Partner: No    Emotionally Abused: No    Physically Abused: No    Sexually Abused: No    Physical Exam      Future Appointments  Date Time Provider Department Center  12/11/2022  2:00 PM [facility] ECHO OP 1 [facility]  12/11/2022  3:20 PM Laurey Morale, MD [facility] None  04/25/2023  2:30 PM [facility] LAB [facility] None  04/25/2023  3:00 PM Jaci Standard, MD [facility] None       Kerry Hough,  Paramedic [phone] [facility] Paramedic  11/01/22

## 2022-11-02 ENCOUNTER — Encounter (HOSPITAL_COMMUNITY): Payer: Self-pay | Admitting: Neurological Surgery

## 2022-11-06 ENCOUNTER — Telehealth (HOSPITAL_COMMUNITY): Payer: Self-pay

## 2022-11-06 ENCOUNTER — Other Ambulatory Visit: Payer: Self-pay | Admitting: Neurological Surgery

## 2022-11-06 NOTE — Telephone Encounter (Signed)
The following medications were called into [facility]-  -Pravastatin -Mirtazapine   Maralyn Sago, EMT-Paramedic [phone] 11/06/2022

## 2022-11-15 ENCOUNTER — Other Ambulatory Visit (HOSPITAL_COMMUNITY): Payer: Self-pay

## 2022-11-15 ENCOUNTER — Telehealth: Payer: Self-pay | Admitting: Pulmonary Disease

## 2022-11-15 ENCOUNTER — Telehealth (HOSPITAL_COMMUNITY): Payer: Self-pay | Admitting: Cardiology

## 2022-11-15 DIAGNOSIS — I5022 Chronic systolic (congestive) heart failure: Secondary | ICD-10-CM

## 2022-11-15 NOTE — Telephone Encounter (Signed)
Per para medicine called to report Patient has completed spinal stimulator battery change out. Now MRI compatible    Per Dr Shirlee Latch - We will arrange for cardiac MRI after his spinal stimulator battery is changed and device is thus MRI compatible.    Order placed-CHECK OUT ROUTER COMPLETED for pre cert  Will attempt to get scheduled prior to follow up 9/24, so results can be review at appointment.

## 2022-11-15 NOTE — Telephone Encounter (Signed)
[facility] states they fax'd twice for supplies once on 8/12 and then on 8/26. Did we get the request?  Megan @ [phone]

## 2022-11-15 NOTE — Progress Notes (Signed)
Paramedicine Encounter    Patient ID: Justin [name], male    DOB: 1966-01-30, 57 y.o.   MRN: [id number]   Complaints-tired  Edema-none  Compliance with meds-yes   Pill box filled-yes If so, by whom-paramedic   Refills needed- [phone] Entresto-[phone]  Pt reports he is doing ok. He is working part time at [employer] part time. He reports he feels ok while working.  Similar to maintenence work   Need to send message to triage ref he now is able to have the scan that dr Shirlee Latch wanted him to have since he got his battery changed out from his device to his back  The heat does get him dizzy at times.  Pt denies c/p, no increased sob.  He did have his meds today.  He has been taking lasix daily from the bottle-I have not been putting them in pill box-which we have talked about this many times before and to only take PRN but he again has been taking daily-a lot of urination. So we discussed again that its only if needed and discussed those symptoms to look for.  His weight is decreasing, appetite good. Think he is just losing too much fluids. Will f/u with him next week.    BP 118/70   Pulse 72   Resp 16   Wt 194 lb (88 kg)   SpO2 98%   BMI 28.65 kg/m  Weight yesterday-? Last visit weight-208  Patient Care Team: Pa, [facility] as PCP - General (Internal Medicine) Christell Constant, MD as PCP - Cardiology (Cardiology)  Patient Active Problem List   Diagnosis Date Noted   Pain in right leg 08/06/2022   Fatigue 07/31/2022   History of DVT (deep vein thrombosis) 07/17/2022   Tremors of nervous system 07/17/2022   Nonischemic cardiomyopathy (HCC) 05/18/2022   Chronic systolic CHF (congestive heart failure) (HCC) 05/18/2022   Snoring 04/04/2022   Normocytic anemia 12/22/2021   History of pulmonary embolism 10/19/2021   Status post lumbar spinal fusion 01/03/2018   Lumbar stenosis 12/23/2017   Spondylolisthesis, lumbar region 06/25/2017   Achilles  tendinitis of left lower extremity 12/07/2016   Achilles tendinitis, left leg 05/17/2016   Obstructive sleep apnea on CPAP 03/07/2015   Achilles rupture, left 10/21/2014   Bipolar disorder, unspecified (HCC) 01/07/2013   Headache(784.0) 01/07/2013   Chronic anticoagulation 01/04/2013    Current Outpatient Medications:    carvedilol (COREG) 6.25 MG tablet, Take 1 tablet (6.25 mg total) by mouth 2 (two) times daily., Disp: 60 tablet, Rfl: 11   dabigatran (PRADAXA) 150 MG CAPS capsule, TAKE 1 CAPSULE(150 MG) BY MOUTH TWICE DAILY, Disp: 180 capsule, Rfl: 3   doxepin (SINEQUAN) 75 MG capsule, Take 75 mg by mouth at bedtime., Disp: , Rfl:    empagliflozin (JARDIANCE) 10 MG TABS tablet, Take 1 tablet (10 mg total) by mouth daily before breakfast., Disp: 30 tablet, Rfl: 11   mirtazapine (REMERON) 45 MG tablet, Take 45 mg by mouth at bedtime., Disp: , Rfl:    potassium chloride SA (KLOR-CON M) 20 MEQ tablet, TAKE 2 TABLETS(40 MEQ) BY MOUTH TWICE DAILY FOR 1 DAY THEN TAKE 2 TABLETS(40 MEQ) BY MOUTH DAILY, Disp: 64 tablet, Rfl: 0   pravastatin (PRAVACHOL) 10 MG tablet, Take 10 mg by mouth at bedtime., Disp: , Rfl:    QUEtiapine (SEROQUEL XR) 400 MG 24 hr tablet, Take 800 mg by mouth at bedtime., Disp: , Rfl:    sacubitril-valsartan (ENTRESTO) 97-103 MG, Take 1 tablet by mouth  2 (two) times daily., Disp: 60 tablet, Rfl: 11   spironolactone (ALDACTONE) 25 MG tablet, Take 1 tablet (25 mg total) by mouth daily., Disp: 30 tablet, Rfl: 11   tiZANidine (ZANAFLEX) 4 MG tablet, Take 4 mg by mouth 2 (two) times daily as needed for muscle spasms., Disp: , Rfl:    empagliflozin (JARDIANCE) 25 MG TABS tablet, Take 25 mg by mouth daily. (Patient not taking: Reported on 11/01/2022), Disp: , Rfl:    furosemide (LASIX) 40 MG tablet, Take 1 tablet (40 mg total) by mouth as needed for fluid or edema (take if a weight gain of 3 lbs within 24 hours or 5 lbs in a week.)., Disp: 90 tablet, Rfl: 3   HYDROcodone-acetaminophen  (NORCO/VICODIN) 5-325 MG tablet, Take 1 tablet by mouth every 6 (six) hours as needed for moderate pain or severe pain., Disp: 20 tablet, Rfl: 0 Allergies  Allergen Reactions   Lyrica [Pregabalin] Other (See Comments)    Hallucinations      Social History   Socioeconomic History   Marital status: Single    Spouse name: Not on file   Number of children: Not on file   Years of education: Not on file   Highest education level: Not on file  Occupational History   Occupation: disability  Tobacco Use   Smoking status: Never    Passive exposure: Past   Smokeless tobacco: Never  Vaping Use   Vaping status: Never Used  Substance and Sexual Activity   Alcohol use: No   Drug use: No   Sexual activity: Not on file  Other Topics Concern   Not on file  Social History Narrative   FROM [place], [place]    Lives in a 2 story apartment.  His cousin is currently staying with him.  Has 4 children.  On disability for the past 10 years for pulmonary embolism, bipolar disease.  Education: high school.   Social Determinants of Health   Financial Resource Strain: Not on file  Food Insecurity: No Food Insecurity (07/17/2022)   Hunger Vital Sign    Worried About Running Out of Food in the Last Year: Never true    Ran Out of Food in the Last Year: Never true  Transportation Needs: No Transportation Needs (07/17/2022)   PRAPARE - Administrator, Civil Service (Medical): No    Lack of Transportation (Non-Medical): No  Physical Activity: Not on file  Stress: Not on file  Social Connections: Unknown (07/31/2021)   Received from [facility]   Social Network    Social Network: Not on file  Intimate Partner Violence: Not At Risk (07/17/2022)   Humiliation, Afraid, Rape, and Kick questionnaire    Fear of Current or Ex-Partner: No    Emotionally Abused: No    Physically Abused: No    Sexually Abused: No    Physical Exam      Future Appointments  Date Time Provider Department  Center  12/11/2022  2:00 PM [facility] ECHO OP 1 [facility]ECHOL[facility]  12/11/2022  3:20 PM Laurey Morale, MD [facility] None  04/25/2023  2:30 PM [facility] LAB [facility] None  04/25/2023  3:00 PM Jaci Standard, MD [facility] None       Kerry Hough, Paramedic [phone] [facility] Paramedic  11/15/22

## 2022-11-21 NOTE — Telephone Encounter (Signed)
Requested request be refaxed.

## 2022-11-22 ENCOUNTER — Other Ambulatory Visit (HOSPITAL_COMMUNITY): Payer: Self-pay | Admitting: Neurological Surgery

## 2022-11-22 ENCOUNTER — Telehealth (HOSPITAL_COMMUNITY): Payer: Self-pay

## 2022-11-22 DIAGNOSIS — G894 Chronic pain syndrome: Secondary | ICD-10-CM

## 2022-11-22 NOTE — Telephone Encounter (Signed)
Pt called in to report he needs to cancel his appointment with me today due to his gf's mom is sick and he needs to help them do some things.   Will f/u next week.   Kerry Hough, EMT-Paramedic  [phone] 11/22/2022

## 2022-11-26 ENCOUNTER — Other Ambulatory Visit (HOSPITAL_COMMUNITY): Payer: Self-pay | Admitting: Neurological Surgery

## 2022-11-26 DIAGNOSIS — G894 Chronic pain syndrome: Secondary | ICD-10-CM

## 2022-11-28 ENCOUNTER — Ambulatory Visit (HOSPITAL_COMMUNITY)
Admission: RE | Admit: 2022-11-28 | Discharge: 2022-11-28 | Disposition: A | Discharge status: Home / Self Care | Payer: MEDICAID | Source: Ambulatory Visit | Attending: Neurological Surgery | Admitting: Neurological Surgery

## 2022-11-28 DIAGNOSIS — G894 Chronic pain syndrome: Secondary | ICD-10-CM | POA: Insufficient documentation

## 2022-11-29 ENCOUNTER — Other Ambulatory Visit (HOSPITAL_COMMUNITY): Payer: Self-pay

## 2022-11-29 NOTE — Progress Notes (Signed)
Paramedicine Encounter    Patient ID: Justin [name], male    DOB: March 19, 1966, 57 y.o.   MRN: [id number]   Complaints-leg pain, lower back pain   Edema-some to left leg in calf   Compliance with meds-yes  Pill box filled-yes  If so, by whom-paramedic   Refills needed-entresto, mirtazapine   -entresto is missing in 4 days-he said there are several meds from pharm to p/u  -mirtazapine-missing in bedtime dose   He is able to place where needed, I left a note for him for where they need to go.  Pt denies increased sob, no dizziness, no c/p. No palpitations.  Meds verified and pill box filled with the exception of written above.  He c/o pain to his lower back that is going down his rt leg. There is slight swelling to it. He has good pedal pulses, legs feel same temp to touch.  He said he may go to urgent care to be eval for his leg pain.     BP (!) 104/0   Pulse 78   Resp 18   Wt 196 lb (88.9 kg)   SpO2 98%   BMI 28.94 kg/m  Weight yesterday-? Last visit weight-194  Patient Care Team: Pa, [facility] as PCP - General (Internal Medicine) Christell Constant, MD as PCP - Cardiology (Cardiology)  Patient Active Problem List   Diagnosis Date Noted   Pain in right leg 08/06/2022   Fatigue 07/31/2022   History of DVT (deep vein thrombosis) 07/17/2022   Tremors of nervous system 07/17/2022   Nonischemic cardiomyopathy (HCC) 05/18/2022   Chronic systolic CHF (congestive heart failure) (HCC) 05/18/2022   Snoring 04/04/2022   Normocytic anemia 12/22/2021   History of pulmonary embolism 10/19/2021   Status post lumbar spinal fusion 01/03/2018   Lumbar stenosis 12/23/2017   Spondylolisthesis, lumbar region 06/25/2017   Achilles tendinitis of left lower extremity 12/07/2016   Achilles tendinitis, left leg 05/17/2016   Obstructive sleep apnea on CPAP 03/07/2015   Achilles rupture, left 10/21/2014   Bipolar disorder, unspecified (HCC) 01/07/2013   Headache(784.0) 01/07/2013    Chronic anticoagulation 01/04/2013    Current Outpatient Medications:    carvedilol (COREG) 6.25 MG tablet, Take 1 tablet (6.25 mg total) by mouth 2 (two) times daily., Disp: 60 tablet, Rfl: 11   dabigatran (PRADAXA) 150 MG CAPS capsule, TAKE 1 CAPSULE(150 MG) BY MOUTH TWICE DAILY, Disp: 180 capsule, Rfl: 3   doxepin (SINEQUAN) 75 MG capsule, Take 75 mg by mouth at bedtime., Disp: , Rfl:    empagliflozin (JARDIANCE) 10 MG TABS tablet, Take 1 tablet (10 mg total) by mouth daily before breakfast., Disp: 30 tablet, Rfl: 11   empagliflozin (JARDIANCE) 25 MG TABS tablet, Take 25 mg by mouth daily. (Patient not taking: Reported on 11/01/2022), Disp: , Rfl:    furosemide (LASIX) 40 MG tablet, Take 1 tablet (40 mg total) by mouth as needed for fluid or edema (take if a weight gain of 3 lbs within 24 hours or 5 lbs in a week.)., Disp: 90 tablet, Rfl: 3   HYDROcodone-acetaminophen (NORCO/VICODIN) 5-325 MG tablet, Take 1 tablet by mouth every 6 (six) hours as needed for moderate pain or severe pain., Disp: 20 tablet, Rfl: 0   mirtazapine (REMERON) 45 MG tablet, Take 45 mg by mouth at bedtime., Disp: , Rfl:    potassium chloride SA (KLOR-CON M) 20 MEQ tablet, TAKE 2 TABLETS(40 MEQ) BY MOUTH TWICE DAILY FOR 1 DAY THEN TAKE 2 TABLETS(40 MEQ) BY  MOUTH DAILY, Disp: 64 tablet, Rfl: 0   pravastatin (PRAVACHOL) 10 MG tablet, Take 10 mg by mouth at bedtime., Disp: , Rfl:    QUEtiapine (SEROQUEL XR) 400 MG 24 hr tablet, Take 800 mg by mouth at bedtime., Disp: , Rfl:    sacubitril-valsartan (ENTRESTO) 97-103 MG, Take 1 tablet by mouth 2 (two) times daily., Disp: 60 tablet, Rfl: 11   spironolactone (ALDACTONE) 25 MG tablet, Take 1 tablet (25 mg total) by mouth daily., Disp: 30 tablet, Rfl: 11   tiZANidine (ZANAFLEX) 4 MG tablet, Take 4 mg by mouth 2 (two) times daily as needed for muscle spasms., Disp: , Rfl:  Allergies  Allergen Reactions   Lyrica [Pregabalin] Other (See Comments)    Hallucinations      Social  History   Socioeconomic History   Marital status: Single    Spouse name: Not on file   Number of children: Not on file   Years of education: Not on file   Highest education level: Not on file  Occupational History   Occupation: disability  Tobacco Use   Smoking status: Never    Passive exposure: Past   Smokeless tobacco: Never  Vaping Use   Vaping status: Never Used  Substance and Sexual Activity   Alcohol use: No   Drug use: No   Sexual activity: Not on file  Other Topics Concern   Not on file  Social History Narrative   FROM [place], [place]    Lives in a 2 story apartment.  His cousin is currently staying with him.  Has 4 children.  On disability for the past 10 years for pulmonary embolism, bipolar disease.  Education: high school.   Social Determinants of Health   Financial Resource Strain: Not on file  Food Insecurity: No Food Insecurity (07/17/2022)   Hunger Vital Sign    Worried About Running Out of Food in the Last Year: Never true    Ran Out of Food in the Last Year: Never true  Transportation Needs: No Transportation Needs (07/17/2022)   PRAPARE - Administrator, Civil Service (Medical): No    Lack of Transportation (Non-Medical): No  Physical Activity: Not on file  Stress: Not on file  Social Connections: Unknown (07/31/2021)   Received [facility]   Social Network    Social Network: Not on file  Intimate Partner Violence: Not At Risk (07/17/2022)   Humiliation, Afraid, Rape, and Kick questionnaire    Fear of Current or Ex-Partner: No    Emotionally Abused: No    Physically Abused: No    Sexually Abused: No    Physical Exam      Future Appointments  Date Time Provider Department Center  12/11/2022  2:00 PM [facility] ECHO OP 1 MC-ECHOLAB [facility]  12/11/2022  3:20 PM Laurey Morale, MD [facility] None  01/28/2023  3:00 PM MC-MR 1 MC-MRI [facility]  04/25/2023  2:30 PM [facility] LAB [facility] None  04/25/2023  3:00 PM Jaci Standard, MD [facility]  None       Kerry Hough, Paramedic [phone] [facility] Paramedic  11/29/22

## 2022-12-05 ENCOUNTER — Encounter (HOSPITAL_COMMUNITY): Payer: Self-pay

## 2022-12-05 ENCOUNTER — Ambulatory Visit (HOSPITAL_COMMUNITY): Payer: MEDICAID

## 2022-12-05 ENCOUNTER — Ambulatory Visit (HOSPITAL_COMMUNITY)
Admission: RE | Admit: 2022-12-05 | Discharge: 2022-12-05 | Disposition: A | Discharge status: Home / Self Care | Payer: MEDICAID | Source: Ambulatory Visit | Attending: Neurological Surgery | Admitting: Neurological Surgery

## 2022-12-05 DIAGNOSIS — G894 Chronic pain syndrome: Secondary | ICD-10-CM | POA: Diagnosis present

## 2022-12-05 MED ORDER — GADOBUTROL 1 MMOL/ML IV SOLN
9.0000 mL | Freq: Once | INTRAVENOUS | Status: AC | PRN
  Administered 2022-12-05: 9 mL via INTRAVENOUS

## 2022-12-11 ENCOUNTER — Ambulatory Visit (HOSPITAL_COMMUNITY)
Admission: RE | Admit: 2022-12-11 | Discharge: 2022-12-11 | Disposition: A | Discharge status: Home / Self Care | Payer: MEDICAID | Source: Ambulatory Visit | Attending: Cardiology | Admitting: Cardiology

## 2022-12-11 ENCOUNTER — Ambulatory Visit (HOSPITAL_BASED_OUTPATIENT_CLINIC_OR_DEPARTMENT_OTHER)
Admission: RE | Admit: 2022-12-11 | Discharge: 2022-12-11 | Disposition: A | Discharge status: Home / Self Care | Payer: MEDICAID | Source: Ambulatory Visit | Attending: Cardiology | Admitting: Cardiology

## 2022-12-11 ENCOUNTER — Encounter (HOSPITAL_COMMUNITY): Payer: Self-pay | Admitting: Cardiology

## 2022-12-11 VITALS — BP 122/80 | HR 95 | Wt 213.4 lb

## 2022-12-11 DIAGNOSIS — N183 Chronic kidney disease, stage 3 unspecified: Secondary | ICD-10-CM | POA: Insufficient documentation

## 2022-12-11 DIAGNOSIS — I5022 Chronic systolic (congestive) heart failure: Secondary | ICD-10-CM

## 2022-12-11 DIAGNOSIS — Z7984 Long term (current) use of oral hypoglycemic drugs: Secondary | ICD-10-CM | POA: Diagnosis not present

## 2022-12-11 DIAGNOSIS — I428 Other cardiomyopathies: Secondary | ICD-10-CM | POA: Diagnosis not present

## 2022-12-11 DIAGNOSIS — Z86718 Personal history of other venous thrombosis and embolism: Secondary | ICD-10-CM | POA: Diagnosis not present

## 2022-12-11 DIAGNOSIS — I130 Hypertensive heart and chronic kidney disease with heart failure and stage 1 through stage 4 chronic kidney disease, or unspecified chronic kidney disease: Secondary | ICD-10-CM | POA: Diagnosis not present

## 2022-12-11 DIAGNOSIS — R0602 Shortness of breath: Secondary | ICD-10-CM | POA: Diagnosis present

## 2022-12-11 DIAGNOSIS — I340 Nonrheumatic mitral (valve) insufficiency: Secondary | ICD-10-CM | POA: Insufficient documentation

## 2022-12-11 DIAGNOSIS — I2510 Atherosclerotic heart disease of native coronary artery without angina pectoris: Secondary | ICD-10-CM | POA: Insufficient documentation

## 2022-12-11 DIAGNOSIS — G4733 Obstructive sleep apnea (adult) (pediatric): Secondary | ICD-10-CM | POA: Insufficient documentation

## 2022-12-11 DIAGNOSIS — Z7902 Long term (current) use of antithrombotics/antiplatelets: Secondary | ICD-10-CM | POA: Insufficient documentation

## 2022-12-11 DIAGNOSIS — Z86711 Personal history of pulmonary embolism: Secondary | ICD-10-CM | POA: Insufficient documentation

## 2022-12-11 DIAGNOSIS — Z79899 Other long term (current) drug therapy: Secondary | ICD-10-CM | POA: Diagnosis not present

## 2022-12-11 LAB — CBC
HCT: 36.3 % — ABNORMAL LOW (ref 39.0–52.0)
Hemoglobin: 11.9 g/dL — ABNORMAL LOW (ref 13.0–17.0)
MCH: 27.8 pg (ref 26.0–34.0)
MCHC: 32.8 g/dL (ref 30.0–36.0)
MCV: 84.8 fL (ref 80.0–100.0)
Platelets: 268 10*3/uL (ref 150–400)
RBC: 4.28 MIL/uL (ref 4.22–5.81)
RDW: 15.4 % (ref 11.5–15.5)
WBC: 7.2 10*3/uL (ref 4.0–10.5)
nRBC: 0 % (ref 0.0–0.2)

## 2022-12-11 LAB — BASIC METABOLIC PANEL
Anion gap: 9 (ref 5–15)
BUN: 7 mg/dL (ref 6–20)
CO2: 26 mmol/L (ref 22–32)
Calcium: 8.8 mg/dL — ABNORMAL LOW (ref 8.9–10.3)
Chloride: 104 mmol/L (ref 98–111)
Creatinine, Ser: 1.62 mg/dL — ABNORMAL HIGH (ref 0.61–1.24)
GFR, Estimated: 49 mL/min — ABNORMAL LOW (ref >60)
Glucose, Bld: 82 mg/dL (ref 70–99)
Potassium: 4.1 mmol/L (ref 3.5–5.1)
Sodium: 139 mmol/L (ref 135–145)

## 2022-12-11 LAB — ECHOCARDIOGRAM COMPLETE
AR max vel: 2.69 cm2
AV Area VTI: 2.91 cm2
AV Area mean vel: 2.61 cm2
AV Mean grad: 4 mmHg
AV Peak grad: 6.2 mmHg
Ao pk vel: 1.24 m/s
Area-P 1/2: 5.75 cm2
S' Lateral: 5.4 cm

## 2022-12-11 LAB — TSH: TSH: 0.188 u[IU]/mL — ABNORMAL LOW (ref 0.350–4.500)

## 2022-12-11 LAB — BRAIN NATRIURETIC PEPTIDE: B Natriuretic Peptide: 26.7 pg/mL (ref 0.0–100.0)

## 2022-12-11 MED ORDER — CARVEDILOL 12.5 MG PO TABS: 12.5000 mg | ORAL_TABLET | Freq: Two times a day (BID) | ORAL | 11 refills | Status: DC

## 2022-12-11 NOTE — Patient Instructions (Signed)
INCREASE Carvedilol to 12.5 mg Twice daily  Labs done today, your results will be available in MyChart, we will contact you for abnormal readings.  Your physician recommends that you schedule a follow-up appointment in: 2 months  If you have any questions or concerns before your next appointment please send [place] a message through [place] or call our office at [phone].    TO LEAVE A MESSAGE FOR THE NURSE SELECT OPTION 2, PLEASE LEAVE A MESSAGE INCLUDING: YOUR NAME DATE OF BIRTH CALL BACK NUMBER REASON FOR CALL**this is important as we prioritize the call backs  YOU WILL RECEIVE A CALL BACK THE SAME DAY AS LONG AS YOU CALL BEFORE 4:00 PM  At the [facility], you and your health needs are our priority. As part of our continuing mission to provide you with exceptional heart care, we have created designated Provider Care Teams. These Care Teams include your primary Cardiologist (physician) and Advanced Practice Providers (APPs- Physician Assistants and Nurse Practitioners) who all work together to provide you with the care you need, when you need it.   You may see any of the following providers on your designated Care Team at your next follow up: Dr Arvilla Meres Dr Marca Ancona Dr. Marcos Eke, NP Robbie Lis, [place] [facility] [place], [place] Brynda Peon, NP Karle Plumber, PharmD   Please be sure to bring in all your medications bottles to every appointment.    Thank you for choosing [facility]

## 2022-12-12 ENCOUNTER — Ambulatory Visit (HOSPITAL_COMMUNITY)
Admission: RE | Admit: 2022-12-12 | Discharge: 2022-12-12 | Disposition: A | Discharge status: Home / Self Care | Payer: MEDICAID | Source: Ambulatory Visit | Attending: Cardiology | Admitting: Cardiology

## 2022-12-12 ENCOUNTER — Telehealth (HOSPITAL_COMMUNITY): Payer: Self-pay

## 2022-12-12 DIAGNOSIS — R7989 Other specified abnormal findings of blood chemistry: Secondary | ICD-10-CM | POA: Diagnosis present

## 2022-12-12 DIAGNOSIS — I5022 Chronic systolic (congestive) heart failure: Secondary | ICD-10-CM

## 2022-12-12 LAB — T4, FREE: Free T4: 0.54 ng/dL — ABNORMAL LOW (ref 0.61–1.12)

## 2022-12-12 NOTE — Telephone Encounter (Signed)
-----   Message from Justin [name] sent at 12/12/2022  8:16 AM EDT ----- TSH is very low.  This may be why he feels tired, suspect hyperthyroidism. Needs to return for blood draw for check of free T3 and free T4 to confirm.  Would make referral to [facility] endocrinology.

## 2022-12-12 NOTE — Progress Notes (Signed)
PCP: [facility], [facility] Cardiology: Dr. Katrinka Blazing HF Cardiology: Dr. Shirlee Latch  [name] is a 57 y.o. with history of PE/DVT, bipolar disorder, and chronic systolic CHF was referred by Dr. Katrinka Blazing for evaluation of CHF. Patient has been on anticoagulation long-term for his h/o PE/DVT.  He does not miss doses.  Suspected clotting disorder, being worked up by hematology for possible antiphospholipid antibody syndrome.  He had initial PE in 2009, Eliquis started.  He had DVT on left while on Eliquis and switched to coumadin. In 9/23, he had a spontaneous left thigh hematoma while INR supratherapeutic.  Warfarin was held and eventually switched to Pradaxa   Due to progressive dyspnea, echo was done in 1/24.  This showed EF 20-25%, severe LV dilation, normal RV, moderate-severe MR. Patient had had coronary CTA in 12/23 with calcium score 49 and mild disease noted in LAD.    TEE in 3/24 showed EF 25-30%, moderate LV dilation, mildly decreased RV function, only mild MR.  LHC/RHC in 3/24 showed mild nonobstructive CAD, normal filling pressures and preserved cardiac output.   Admitted 07/18/22 with tremors. Saw Pyschiatry. Lithium was stopped.   Echo was done today and reviewed, EF 30-35% with global hypokinesis, normal RV, no significant MR.    Today he returns for HF follow up. Weight down 1 lb.  Gets fatigued and sleepy easily, falls asleep during the day despite regular CPAP use.  He is short of breath walking out to his truck but does a lot of walking and lifting with his work and seems to do ok with this.  He is tired after walking up stairs.  No chest pain.  No lightheadedness or palpitations.   ECG (personally reviewed): NSR, LAFB, LVH, poor RWP  Labs (12/23): Pro-BNP 547 Labs (2/24): K 4, creatinine 1.16, BNP 89 Labs (4/24): K 4, creatinine 1.38  Labs (8/24): K 4.2, creatinine 1.72  PMH: 1. HTN 2. OSA: Uses CPAP.   3. H/o DVT and PE: PE 2009 with Eliquis started, DVT on left in 7/23 while on Eliquis,  switched to warfarin.  Had large left thigh intramuscular hematoma with supratherapeutic INR.  Now on Pradaxa.  - Venous dopplers in 2/24 showed no DVT 4. Bipolar disorder: previously on Li.  5. H/o subdural hematoma 10/14 6. Chronic systolic CHF: Echo (1/24) with EF 20-25%, severe LV dilation, normal RV, moderate-severe MR.  - Coronary CTA (12/23): Calcium score 49, mild stenosis in LAD.  - TEE (3/24): EF 25-30%, moderate LV dilation, mildly decreased RV function, only mild MR. - LHC/RHC (3/24): Mild nonobstructive CAD, mean RA 2, PAP 35/17, mean PCWP 12, CI 2.69 - Echo (9/24): EF 30-35% with global hypokinesis, normal RV, no significant MR.   7. Spontaneous large left thigh intramuscular hematoma in setting of supratherapeutic INR on warfarin.  8. ABIs (2/24): normal 9. CKD stage 3  FH: No cardiac problems that he knows of.   Social History   Socioeconomic History   Marital status: Single    Spouse name: Not on file   Number of children: Not on file   Years of education: Not on file   Highest education level: Not on file  Occupational History   Occupation: disability  Tobacco Use   Smoking status: Never    Passive exposure: Past   Smokeless tobacco: Never  Vaping Use   Vaping status: Never Used  Substance and Sexual Activity   Alcohol use: No   Drug use: No   Sexual activity: Not on file  Other Topics Concern   Not on file  Social History Narrative   FROM [place], [place]    Lives in a 2 story apartment.  His cousin is currently staying with him.  Has 4 children.  On disability for the past 10 years for pulmonary embolism, bipolar disease.  Education: high school.   Social Determinants of Health   Financial Resource Strain: Not on file  Food Insecurity: No Food Insecurity (07/17/2022)   Hunger Vital Sign    Worried About Running Out of Food in the Last Year: Never true    Ran Out of Food in the Last Year: Never true  Transportation Needs: No Transportation Needs  (07/17/2022)   PRAPARE - Administrator, Civil Service (Medical): No    Lack of Transportation (Non-Medical): No  Physical Activity: Not on file  Stress: Not on file  Social Connections: Unknown (07/31/2021)   [facility], [facility]   Social Network    Social Network: Not on file  Intimate Partner Violence: Not At Risk (07/17/2022)   Humiliation, Afraid, Rape, and Kick questionnaire    Fear of Current or Ex-Partner: No    Emotionally Abused: No    Physically Abused: No    Sexually Abused: No   ROS: All systems reviewed and negative except as per HPI.   Current Outpatient Medications  Medication Sig Dispense Refill   dabigatran (PRADAXA) 150 MG CAPS capsule TAKE 1 CAPSULE(150 MG) BY MOUTH TWICE DAILY 180 capsule 3   doxepin (SINEQUAN) 75 MG capsule Take 75 mg by mouth at bedtime.     empagliflozin (JARDIANCE) 10 MG TABS tablet Take 1 tablet (10 mg total) by mouth daily before breakfast. 30 tablet 11   empagliflozin (JARDIANCE) 25 MG TABS tablet Take 25 mg by mouth daily.     furosemide (LASIX) 40 MG tablet Take 1 tablet (40 mg total) by mouth as needed for fluid or edema (take if a weight gain of 3 lbs within 24 hours or 5 lbs in a week.). 90 tablet 3   mirtazapine (REMERON) 45 MG tablet Take 45 mg by mouth at bedtime.     potassium chloride SA (KLOR-CON M) 20 MEQ tablet TAKE 2 TABLETS(40 MEQ) BY MOUTH TWICE DAILY FOR 1 DAY THEN TAKE 2 TABLETS(40 MEQ) BY MOUTH DAILY 64 tablet 0   pravastatin (PRAVACHOL) 10 MG tablet Take 10 mg by mouth at bedtime.     QUEtiapine (SEROQUEL XR) 400 MG 24 hr tablet Take 800 mg by mouth at bedtime.     sacubitril-valsartan (ENTRESTO) 97-103 MG Take 1 tablet by mouth 2 (two) times daily. 60 tablet 11   spironolactone (ALDACTONE) 25 MG tablet Take 1 tablet (25 mg total) by mouth daily. 30 tablet 11   tiZANidine (ZANAFLEX) 4 MG tablet Take 4 mg by mouth 2 (two) times daily as needed for muscle spasms.     carvedilol (COREG) 12.5  MG tablet Take 1 tablet (12.5 mg total) by mouth 2 (two) times daily. 60 tablet 11   No current facility-administered medications for this encounter.   BP 122/80   Pulse 95   Wt 96.8 kg (213 lb 6.4 oz)   SpO2 95%   BMI 31.51 kg/m   Wt Readings from Last 3 Encounters:  12/11/22 96.8 kg (213 lb 6.4 oz)  11/29/22 88.9 kg (196 lb)  11/15/22 88 kg (194 lb)   General: NAD Neck: No JVD, no thyromegaly or thyroid nodule.  Lungs: Clear to auscultation bilaterally  with normal respiratory effort. CV: Nondisplaced PMI.  Heart regular S1/S2, no S3/S4, no murmur.  No peripheral edema.  No carotid bruit.  Normal pedal pulses.  Abdomen: Soft, nontender, no hepatosplenomegaly, no distention.  Skin: Intact without lesions or rashes.  Neurologic: Alert and oriented x 3.  Psych: Flat affect. Extremities: No clubbing or cyanosis.  HEENT: Normal.   Assessment/plan: 1. Venous thromboembolism: PE in 2009, DVT in 7/23 while on Eliquis.  Switched to warfarin and had spontaneous left thigh hematoma while INR supratherapeutic on warfarin. LE dopplers (4/24) negative for DVT. Now on Pradaxa.   - Continue Pradaxa. CBC today.  2. Chronic systolic CHF: Nonischemic cardiomyopathy.  Echo (1/24) with EF 20-25%, severe LV dilation, normal RV, moderate-severe MR. TEE in 3/24 showed EF 25-30%, moderate LV dilation, mildly decreased RV function, only mild MR.  LHC/RHC in 3/24 with mild nonobstructive CAD, normal filling pressures, preserved cardiac output.  Echo today showed EF 30-35% with global hypokinesis, normal RV, no significant MR.  He denies history of ETOH or drugs, no family history of cardiomyopathy or SCD.  He has a history of HTN. NYHA II probably. He is not volume overloaded on exam. - Continue Entresto 97/103 mg bid. BMET/BNP today.  - He has Lasix for prn use, has not used.  - Continue Jardiance 10 mg daily.  - Continue spironolactone 25 mg daily.  - Increase Coreg to 12.5 mg bid.   - He is getting a  cardiac MRI in 11/24.  Will reassess after this to decide on need for ICD.  3. Mitral regurgitation: Moderate-severe on 1/24 echo.  However, TEE in 3/24 showed only mild MR. Echo today showed minimal MR.  4. HTN: BP controlled.   5. OSA: Using CPAP but still with daytime sleepiness/fatigue.  Will see if I can have him evaluated by Dr. Mayford Knife.   Followup after cardiac MRI.   Marca Ancona  12/12/2022

## 2022-12-12 NOTE — Telephone Encounter (Signed)
Spoke with patient regarding the following results. Patient made aware and patient verbalized understanding.   Patient will come today for Free T3 and T4-this is scheduled and referral placed to endocrinology.  Lab orderes placed.

## 2022-12-13 LAB — T3, FREE: T3, Free: 2.4 pg/mL (ref 2.0–4.4)

## 2022-12-14 ENCOUNTER — Telehealth (HOSPITAL_COMMUNITY): Payer: Self-pay

## 2022-12-14 NOTE — Telephone Encounter (Signed)
Spoke with patient regarding the following results. Patient made aware and patient verbalized understanding.   Referral is already in.   Advised patient to call back to office with any issues, questions, or concerns. Patient verbalized understanding.

## 2022-12-14 NOTE — Telephone Encounter (Signed)
-----   Message from [employer] sent at 12/13/2022  4:47 PM EDT ----- Low TSH, normal T3, low T4, abnormal thyroid pattern, does not fit hypo- or hyper-thyroidism.  Would refer to [facility] endocrinology to assess.

## 2022-12-18 ENCOUNTER — Telehealth: Payer: Self-pay | Admitting: *Deleted

## 2022-12-18 NOTE — Telephone Encounter (Signed)
Reached out to the patient to find out his dme and he states it is [employer]. Reached out to [employer] and French Ana states the patient is non-compliant with cpap usage. His device is not transmitting meaning it is probably unplugged and it last transmitted on 03/09/2020. I was not able to get a download.

## 2022-12-18 NOTE — Telephone Encounter (Signed)
-----   Message from Armanda Magic sent at 12/12/2022  8:23 AM EDT ----- Coralee North can you get me a download on this patient? ----- Message ----- From: Laurey Morale, MD Sent: 12/12/2022   8:02 AM EDT To: Quintella Reichert, MD  Traci, this patient is using CPAP but still has a lot of daytime sleepiness and falls asleep during the day.  Could you get him in or assess his machine to see if he is getting adequate CPAP?

## 2022-12-19 ENCOUNTER — Other Ambulatory Visit (HOSPITAL_COMMUNITY): Payer: Self-pay

## 2022-12-19 NOTE — Telephone Encounter (Signed)
Patient returned staff call. 

## 2022-12-19 NOTE — Telephone Encounter (Signed)
Maralyn Sago, Paramedic  Reesa Chew, CMA Spoke to Florentina Addison and she saw Mr. Mohrmann in the home today and reports he used his CPAP for the first time last night- she will continue to educate on compliance and encourage same!

## 2022-12-19 NOTE — Telephone Encounter (Signed)
There was no days of data on patients download. Patient was encouraged to continue to wear his device at night for at least 2 weeks  and at that time I will pull another download.

## 2022-12-19 NOTE — Progress Notes (Unsigned)
Paramedicine Encounter    Patient ID: Justin [name], male    DOB: [birth year], 57 y.o.   MRN: [id number]   Complaints-nothing new from clinic visit   Edema-very slight to ankles   Compliance with meds-unsure--but reports ye   Pill box filled-yes  If so, by whom-paramedic   Refills needed- Jardiance  Emtresto  Does not have the spiro bottle  So his pill box will be missing the spiro and entresto  These 3 were called in while I was here today.  They will be ready in an hour.  Advised him what was missing and where it was needed- he understood. Told him to call me if he had any questions. He did admit that he is missing doses of his meds after I asked about his potassium that was for a 30 day supply that got filled over 2 months ago but still has some in it.  That was discussed and he didn't really have a clear reasoning behind him missing doses.     Pt was seen in clinic last week. I was going to meet him there but he was seen early so I missed the appointment.  Pts carvedilol was increased to 12.5mg   He reports that he is back using his CPAP. Used first time last night in a long time.  He was referred to endo-he has not heard from them yet.  He reports having his meds today.   Will f/u next week.    BP (!) 120/90   Pulse 78   Resp 16   Wt 210 lb (95.3 kg)   SpO2 98%   BMI 31.01 kg/m  Weight yesterday-210  Last visit weight-213 @ clinic   Patient Care Team: Pa, [facility] as PCP - General (Internal Medicine) Christell Constant, MD as PCP - Cardiology (Cardiology)  Patient Active Problem List   Diagnosis Date Noted   Pain in right leg 08/06/2022   Fatigue 07/31/2022   History of DVT (deep vein thrombosis) 07/17/2022   Tremors of nervous system 07/17/2022   Nonischemic cardiomyopathy (HCC) 05/18/2022   Chronic systolic CHF (congestive heart failure) (HCC) 05/18/2022   Snoring 04/04/2022   Normocytic anemia 12/22/2021   History of pulmonary embolism  10/19/2021   Status post lumbar spinal fusion 01/03/2018   Lumbar stenosis 12/23/2017   Spondylolisthesis, lumbar region 06/25/2017   Achilles tendinitis of left lower extremity 12/07/2016   Achilles tendinitis, left leg 05/17/2016   Obstructive sleep apnea on CPAP 03/07/2015   Achilles rupture, left 10/21/2014   Bipolar disorder, unspecified (HCC) 01/07/2013   Headache 01/07/2013   Chronic anticoagulation 01/04/2013    Current Outpatient Medications:    carvedilol (COREG) 12.5 MG tablet, Take 1 tablet (12.5 mg total) by mouth 2 (two) times daily., Disp: 60 tablet, Rfl: 11   doxepin (SINEQUAN) 75 MG capsule, Take 75 mg by mouth at bedtime., Disp: , Rfl:    empagliflozin (JARDIANCE) 10 MG TABS tablet, Take 1 tablet (10 mg total) by mouth daily before breakfast., Disp: 30 tablet, Rfl: 11   mirtazapine (REMERON) 45 MG tablet, Take 45 mg by mouth at bedtime., Disp: , Rfl:    potassium chloride SA (KLOR-CON M) 20 MEQ tablet, TAKE 2 TABLETS(40 MEQ) BY MOUTH TWICE DAILY FOR 1 DAY THEN TAKE 2 TABLETS(40 MEQ) BY MOUTH DAILY, Disp: 64 tablet, Rfl: 0   pravastatin (PRAVACHOL) 10 MG tablet, Take 10 mg by mouth at bedtime., Disp: , Rfl:    QUEtiapine (SEROQUEL XR) 400 MG 24  hr tablet, Take 800 mg by mouth at bedtime., Disp: , Rfl:    sacubitril-valsartan (ENTRESTO) 97-103 MG, Take 1 tablet by mouth 2 (two) times daily., Disp: 60 tablet, Rfl: 11   tiZANidine (ZANAFLEX) 4 MG tablet, Take 4 mg by mouth 2 (two) times daily as needed for muscle spasms., Disp: , Rfl:    dabigatran (PRADAXA) 150 MG CAPS capsule, TAKE 1 CAPSULE(150 MG) BY MOUTH TWICE DAILY, Disp: 180 capsule, Rfl: 3   empagliflozin (JARDIANCE) 25 MG TABS tablet, Take 25 mg by mouth daily. (Patient not taking: Reported on 12/19/2022), Disp: , Rfl:    furosemide (LASIX) 40 MG tablet, Take 1 tablet (40 mg total) by mouth as needed for fluid or edema (take if a weight gain of 3 lbs within 24 hours or 5 lbs in a week.). (Patient not taking: Reported  on 12/19/2022), Disp: 90 tablet, Rfl: 3   spironolactone (ALDACTONE) 25 MG tablet, Take 1 tablet (25 mg total) by mouth daily., Disp: 30 tablet, Rfl: 11 Allergies  Allergen Reactions   Lyrica [Pregabalin] Other (See Comments)    Hallucinations      Social History   Socioeconomic History   Marital status: Single    Spouse name: Not on file   Number of children: Not on file   Years of education: Not on file   Highest education level: Not on file  Occupational History   Occupation: disability  Tobacco Use   Smoking status: Never    Passive exposure: Past   Smokeless tobacco: Never  Vaping Use   Vaping status: Never Used  Substance and Sexual Activity   Alcohol use: No   Drug use: No   Sexual activity: Not on file  Other Topics Concern   Not on file  Social History Narrative   FROM [place], [place]    Lives in a 2 story apartment.  His cousin is currently staying with him.  Has 4 children.  On disability for the past 10 years for pulmonary embolism, bipolar disease.  Education: high school.   Social Determinants of Health   Financial Resource Strain: Not on file  Food Insecurity: No Food Insecurity (07/17/2022)   Hunger Vital Sign    Worried About Running Out of Food in the Last Year: Never true    Ran Out of Food in the Last Year: Never true  Transportation Needs: No Transportation Needs (07/17/2022)   PRAPARE - Administrator, Civil Service (Medical): No    Lack of Transportation (Non-Medical): No  Physical Activity: Not on file  Stress: Not on file  Social Connections: Unknown (07/31/2021)   Received from [facility], [facility]   Social Network    Social Network: Not on file  Intimate Partner Violence: Not At Risk (07/17/2022)   Humiliation, Afraid, Rape, and Kick questionnaire    Fear of Current or Ex-Partner: No    Emotionally Abused: No    Physically Abused: No    Sexually Abused: No    Physical Exam      Future Appointments  Date Time  Provider Department Center  01/28/2023  3:00 PM [facility]-MR 1 [facility]  02/06/2023 11:40 AM Laurey Morale, MD [facility] None  04/25/2023  2:30 PM [facility] LAB [facility] None  04/25/2023  3:00 PM Jaci Standard, MD [facility] None       Kerry Hough, Paramedic [phone] [facility] Paramedic  12/19/22

## 2023-01-02 ENCOUNTER — Other Ambulatory Visit (HOSPITAL_COMMUNITY): Payer: Self-pay

## 2023-01-02 NOTE — Progress Notes (Signed)
Paramedicine Encounter    Patient ID: Justin [name], male    DOB: 07/14/1965, 57 y.o.   MRN: [id number]   Complaints-chronic back pain   Edema-none   Compliance with meds-misses some   Pill box filled-yes  If so, by whom=paramedic   Refills needed-potassium and doxepin   Pt reports he is doing ok. He has chronic back pain, he reports doc told him recently that he is going to need more surgery soon.  He denies increased sob, no dizziness, no c/p.  No meds today.  Weight stable. Appetite ok.  He is being more compliant with meds and cpap.    Meds verified and pill box refilled.  He is going to call to get those refills done.   BP (!) 130/92   Pulse 88   Resp 16   Wt 212 lb (96.2 kg)   SpO2 98%   BMI 31.31 kg/m  Weight yesterday-? Last visit weight-210  Patient Care Team: Pa, [facility] as PCP - General (Internal Medicine) Christell Constant, MD as PCP - Cardiology (Cardiology)  Patient Active Problem List   Diagnosis Date Noted   Pain in right leg 08/06/2022   Fatigue 07/31/2022   History of DVT (deep vein thrombosis) 07/17/2022   Tremors of nervous system 07/17/2022   Nonischemic cardiomyopathy (HCC) 05/18/2022   Chronic systolic CHF (congestive heart failure) (HCC) 05/18/2022   Snoring 04/04/2022   Normocytic anemia 12/22/2021   History of pulmonary embolism 10/19/2021   Status post lumbar spinal fusion 01/03/2018   Lumbar stenosis 12/23/2017   Spondylolisthesis, lumbar region 06/25/2017   Achilles tendinitis of left lower extremity 12/07/2016   Achilles tendinitis, left leg 05/17/2016   Obstructive sleep apnea on CPAP 03/07/2015   Achilles rupture, left 10/21/2014   Bipolar disorder, unspecified (HCC) 01/07/2013   Headache 01/07/2013   Chronic anticoagulation 01/04/2013    Current Outpatient Medications:    carvedilol (COREG) 12.5 MG tablet, Take 1 tablet (12.5 mg total) by mouth 2 (two) times daily., Disp: 60 tablet, Rfl: 11   dabigatran  (PRADAXA) 150 MG CAPS capsule, TAKE 1 CAPSULE(150 MG) BY MOUTH TWICE DAILY, Disp: 180 capsule, Rfl: 3   doxepin (SINEQUAN) 75 MG capsule, Take 75 mg by mouth at bedtime., Disp: , Rfl:    empagliflozin (JARDIANCE) 10 MG TABS tablet, Take 1 tablet (10 mg total) by mouth daily before breakfast., Disp: 30 tablet, Rfl: 11   empagliflozin (JARDIANCE) 25 MG TABS tablet, Take 25 mg by mouth daily. (Patient not taking: Reported on 12/19/2022), Disp: , Rfl:    furosemide (LASIX) 40 MG tablet, Take 1 tablet (40 mg total) by mouth as needed for fluid or edema (take if a weight gain of 3 lbs within 24 hours or 5 lbs in a week.). (Patient not taking: Reported on 12/19/2022), Disp: 90 tablet, Rfl: 3   mirtazapine (REMERON) 45 MG tablet, Take 45 mg by mouth at bedtime., Disp: , Rfl:    potassium chloride SA (KLOR-CON M) 20 MEQ tablet, TAKE 2 TABLETS(40 MEQ) BY MOUTH TWICE DAILY FOR 1 DAY THEN TAKE 2 TABLETS(40 MEQ) BY MOUTH DAILY, Disp: 64 tablet, Rfl: 0   pravastatin (PRAVACHOL) 10 MG tablet, Take 10 mg by mouth at bedtime., Disp: , Rfl:    QUEtiapine (SEROQUEL XR) 400 MG 24 hr tablet, Take 800 mg by mouth at bedtime., Disp: , Rfl:    sacubitril-valsartan (ENTRESTO) 97-103 MG, Take 1 tablet by mouth 2 (two) times daily., Disp: 60 tablet, Rfl: 11  spironolactone (ALDACTONE) 25 MG tablet, Take 1 tablet (25 mg total) by mouth daily., Disp: 30 tablet, Rfl: 11   tiZANidine (ZANAFLEX) 4 MG tablet, Take 4 mg by mouth 2 (two) times daily as needed for muscle spasms., Disp: , Rfl:  Allergies  Allergen Reactions   Lyrica [Pregabalin] Other (See Comments)    Hallucinations      Social History   Socioeconomic History   Marital status: Single    Spouse name: Not on file   Number of children: Not on file   Years of education: Not on file   Highest education level: Not on file  Occupational History   Occupation: disability  Tobacco Use   Smoking status: Never    Passive exposure: Past   Smokeless tobacco: Never   Vaping Use   Vaping status: Never Used  Substance and Sexual Activity   Alcohol use: No   Drug use: No   Sexual activity: Not on file  Other Topics Concern   Not on file  Social History Narrative   FROM [place], [place]    Lives in a 2 story apartment.  His cousin is currently staying with him.  Has 4 children.  On disability for the past 10 years for pulmonary embolism, bipolar disease.  Education: high school.   Social Determinants of Health   Financial Resource Strain: Not on file  Food Insecurity: No Food Insecurity (07/17/2022)   Hunger Vital Sign    Worried About Running Out of Food in the Last Year: Never true    Ran Out of Food in the Last Year: Never true  Transportation Needs: No Transportation Needs (07/17/2022)   PRAPARE - Administrator, Civil Service (Medical): No    Lack of Transportation (Non-Medical): No  Physical Activity: Not on file  Stress: Not on file  Social Connections: Unknown (07/31/2021)   [facility], [facility]   Social Network    Social Network: Not on file  Intimate Partner Violence: Unknown (07/17/2022)   Humiliation, Afraid, Rape, and Kick questionnaire    Fear of Current or Ex-Partner: No    Emotionally Abused: No    Physically Abused: Not on file    Sexually Abused: Not on file    Physical Exam      Future Appointments  Date Time Provider Department Center  01/28/2023  3:00 PM [facility]-MR 1 [facility]MRI [facility]  02/06/2023 11:40 AM Laurey Morale, MD [facility] None  04/25/2023  2:30 PM [facility] LAB [facility] None  04/25/2023  3:00 PM Jaci Standard, MD [facility] None       Kerry Hough, Paramedic [phone] [facility] Paramedic  01/02/23

## 2023-01-08 ENCOUNTER — Other Ambulatory Visit: Payer: Self-pay | Admitting: Neurological Surgery

## 2023-01-10 ENCOUNTER — Other Ambulatory Visit (HOSPITAL_COMMUNITY): Payer: Self-pay

## 2023-01-10 ENCOUNTER — Other Ambulatory Visit (HOSPITAL_COMMUNITY): Payer: Self-pay | Admitting: Family Medicine

## 2023-01-10 NOTE — Progress Notes (Signed)
Paramedicine Encounter    Patient ID: Justin [name], male    DOB: 12/10/1965, 57 y.o.   MRN: [id number]   Complaints-none   Edema-none   Compliance with meds-reports so   Pill box filled-yes If so, by whom-he started the AM side of meds, I completed pill box   Refills needed-potassium, doxepin  He called those in during visit.     Pt reports he is doing ok. He denies c/p, no increased sob. No dizziness. He is going out of town next week for a few days. Will see him on Tuesday to complete pill box again for his trip.  He is having surgery on his back next month.  Meds verified and pill box refilled.    BP 102/72   Pulse 86   Resp 16   Wt 212 lb (96.2 kg)   SpO2 97%   BMI 31.31 kg/m  Weight yesterday-? Last visit weight-212  Patient Care Team: Pa, [facility] as PCP - General (Internal Medicine) Christell Constant, MD as PCP - Cardiology (Cardiology)  Patient Active Problem List   Diagnosis Date Noted   Pain in right leg 08/06/2022   Fatigue 07/31/2022   History of DVT (deep vein thrombosis) 07/17/2022   Tremors of nervous system 07/17/2022   Nonischemic cardiomyopathy (HCC) 05/18/2022   Chronic systolic CHF (congestive heart failure) (HCC) 05/18/2022   Snoring 04/04/2022   Normocytic anemia 12/22/2021   History of pulmonary embolism 10/19/2021   Status post lumbar spinal fusion 01/03/2018   Lumbar stenosis 12/23/2017   Spondylolisthesis, lumbar region 06/25/2017   Achilles tendinitis of left lower extremity 12/07/2016   Achilles tendinitis, left leg 05/17/2016   Obstructive sleep apnea on CPAP 03/07/2015   Achilles rupture, left 10/21/2014   Bipolar disorder, unspecified (HCC) 01/07/2013   Headache 01/07/2013   Chronic anticoagulation 01/04/2013    Current Outpatient Medications:    carvedilol (COREG) 12.5 MG tablet, Take 1 tablet (12.5 mg total) by mouth 2 (two) times daily., Disp: 60 tablet, Rfl: 11   dabigatran (PRADAXA) 150 MG CAPS capsule, TAKE  1 CAPSULE(150 MG) BY MOUTH TWICE DAILY, Disp: 180 capsule, Rfl: 3   doxepin (SINEQUAN) 75 MG capsule, Take 75 mg by mouth at bedtime., Disp: , Rfl:    empagliflozin (JARDIANCE) 10 MG TABS tablet, Take 1 tablet (10 mg total) by mouth daily before breakfast., Disp: 30 tablet, Rfl: 11   furosemide (LASIX) 40 MG tablet, Take 1 tablet (40 mg total) by mouth as needed for fluid or edema (take if a weight gain of 3 lbs within 24 hours or 5 lbs in a week.)., Disp: 90 tablet, Rfl: 3   mirtazapine (REMERON) 45 MG tablet, Take 45 mg by mouth at bedtime., Disp: , Rfl:    potassium chloride SA (KLOR-CON M) 20 MEQ tablet, TAKE 2 TABLETS(40 MEQ) BY MOUTH TWICE DAILY FOR 1 DAY THEN TAKE 2 TABLETS(40 MEQ) BY MOUTH DAILY, Disp: 64 tablet, Rfl: 0   pravastatin (PRAVACHOL) 10 MG tablet, Take 10 mg by mouth at bedtime., Disp: , Rfl:    QUEtiapine (SEROQUEL XR) 400 MG 24 hr tablet, Take 800 mg by mouth at bedtime., Disp: , Rfl:    sacubitril-valsartan (ENTRESTO) 97-103 MG, Take 1 tablet by mouth 2 (two) times daily., Disp: 60 tablet, Rfl: 11   spironolactone (ALDACTONE) 25 MG tablet, Take 1 tablet (25 mg total) by mouth daily., Disp: 30 tablet, Rfl: 11   tiZANidine (ZANAFLEX) 4 MG tablet, Take 4 mg by mouth 2 (two)  times daily as needed for muscle spasms., Disp: , Rfl:  Allergies  Allergen Reactions   Lyrica [Pregabalin] Other (See Comments)    Hallucinations      Social History   Socioeconomic History   Marital status: Significant Other    Spouse name: Not on file   Number of children: 4   Years of education: Not on file   Highest education level: Not on file  Occupational History   Occupation: disability  Tobacco Use   Smoking status: Never    Passive exposure: Past   Smokeless tobacco: Never  Vaping Use   Vaping status: Never Used  Substance and Sexual Activity   Alcohol use: No   Drug use: No   Sexual activity: Not on file  Other Topics Concern   Not on file  Social History Narrative   [place]    Lives in a 2 story apartment.  His cousin is currently staying with him.  Has 4 children.  On disability for the past 10 years for pulmonary embolism, bipolar disease.  Education: high school.   Social Determinants of Health   Financial Resource Strain: Not on file  Food Insecurity: No Food Insecurity (07/17/2022)   Hunger Vital Sign    Worried About Running Out of Food in the Last Year: Never true    Ran Out of Food in the Last Year: Never true  Transportation Needs: No Transportation Needs (07/17/2022)   PRAPARE - Administrator, Civil Service (Medical): No    Lack of Transportation (Non-Medical): No  Physical Activity: Not on file  Stress: Not on file  Social Connections: Unknown (07/31/2021)   Received [facility]   Social Network    Social Network: Not on file  Intimate Partner Violence: Unknown (07/17/2022)   Humiliation, Afraid, Rape, and Kick questionnaire    Fear of Current or Ex-Partner: No    Emotionally Abused: No    Physically Abused: Not on file    Sexually Abused: Not on file    Physical Exam      Future Appointments  Date Time Provider Department Center  01/28/2023  3:00 PM [facility]-MR 1 [facility]MRI [facility]  02/06/2023 11:40 AM Laurey Morale, MD [facility] None  04/25/2023  2:30 PM [facility] LAB [facility] None  04/25/2023  3:00 PM Jaci Standard, MD [facility] None       Kerry Hough, Paramedic [phone] [facility] Paramedic  01/15/23

## 2023-01-11 DIAGNOSIS — G4733 Obstructive sleep apnea (adult) (pediatric): Secondary | ICD-10-CM | POA: Diagnosis not present

## 2023-01-11 NOTE — Progress Notes (Signed)
Surgical Instructions   Your procedure is scheduled on Tuesday January 22, 2023. Report to [facility] Main Entrance "A" at 9:55 A.M., then check in with the Admitting office. Any questions or running late day of surgery: call [phone]  Questions prior to your surgery date: call [phone], Monday-Friday, 8am-4pm. If you experience any cold or flu symptoms such as cough, fever, chills, shortness of breath, etc. between now and your scheduled surgery, please notify us at the above number.     Remember:  Do not eat or drink after midnight the night before your surgery  Take these medicines the morning of surgery with A SIP OF WATER  carvedilol (COREG)   May take these medicines IF NEEDED: tiZANidine (ZANAFLEX)   Follow your surgeon's instructions on when to stop dabigatran (PRADAXA) .  If no instructions were given by your surgeon then you will need to call the office to get those instructions.    DO NOT TAKE YOUR empagliflozin (JARDIANCE) 72 HOURS PRIOR TO SURGERY, WITH THE LAST DOSE BEING 01/18/2023.     One week prior to surgery, STOP taking any Aspirin (unless otherwise instructed by your surgeon) Aleve, Naproxen, Ibuprofen, Motrin, Advil, Goody's, BC's, all herbal medications, fish oil, and non-prescription vitamins.                     Do NOT Smoke (Tobacco/Vaping) for 24 hours prior to your procedure.  If you use a CPAP at night, you may bring your mask/headgear for your overnight stay.   You will be asked to remove any contacts, glasses, piercing's, hearing aid's, dentures/partials prior to surgery. Please bring cases for these items if needed.    Patients discharged the day of surgery will not be allowed to drive home, and someone needs to stay with them for 24 hours.  SURGICAL WAITING ROOM VISITATION Patients may have no more than 2 support people in the waiting area - these visitors may rotate.   Pre-op nurse will coordinate an appropriate time for 1 ADULT support  person, who may not rotate, to accompany patient in pre-op.  Children under the age of 24 must have an adult with them who is not the patient and must remain in the main waiting area with an adult.  If the patient needs to stay at the hospital during part of their recovery, the visitor guidelines for inpatient rooms apply.  Please refer to the [facility] website for the visitor guidelines for any additional information.   If you received a COVID test during your pre-op visit  it is requested that you wear a mask when out in public, stay away from anyone that may not be feeling well and notify your surgeon if you develop symptoms. If you have been in contact with anyone that has tested positive in the last 10 days please notify you surgeon.      Pre-operative CHG Bathing Instructions   You can play a key role in reducing the risk of infection after surgery. Your skin needs to be as free of germs as possible. You can reduce the number of germs on your skin by washing with CHG (chlorhexidine gluconate) soap before surgery. CHG is an antiseptic soap that kills germs and continues to kill germs even after washing.   DO NOT use if you have an allergy to chlorhexidine/CHG or antibacterial soaps. If your skin becomes reddened or irritated, stop using the CHG and notify one of our RNs at [phone].  TAKE A SHOWER THE NIGHT BEFORE SURGERY AND THE DAY OF SURGERY    Please keep in mind the following:  DO NOT shave, including legs and underarms, 48 hours prior to surgery.   You may shave your face before/day of surgery.  Place clean sheets on your bed the night before surgery Use a clean washcloth (not used since being washed) for each shower. DO NOT sleep with pet's night before surgery.  CHG Shower Instructions:  Wash your face and private area with normal soap. If you choose to wash your hair, wash first with your normal shampoo.  After you use shampoo/soap, rinse your hair and  body thoroughly to remove shampoo/soap residue.  Turn the water OFF and apply half the bottle of CHG soap to a CLEAN washcloth.  Apply CHG soap ONLY FROM YOUR NECK DOWN TO YOUR TOES (washing for 3-5 minutes)  DO NOT use CHG soap on face, private areas, open wounds, or sores.  Pay special attention to the area where your surgery is being performed.  If you are having back surgery, having someone wash your back for you may be helpful. Wait 2 minutes after CHG soap is applied, then you may rinse off the CHG soap.  Pat dry with a clean towel  Put on clean pajamas    Additional instructions for the day of surgery: DO NOT APPLY any lotions, deodorants or cologne.    Do not wear jewelry Do not bring valuables to the hospital. [facility] is not responsible for valuables/personal belongings. Put on clean/comfortable clothes.  Please brush your teeth.  Ask your nurse before applying any prescription medications to the skin.

## 2023-01-14 ENCOUNTER — Encounter (HOSPITAL_COMMUNITY)
Admission: RE | Admit: 2023-01-14 | Discharge: 2023-01-14 | Disposition: A | Discharge status: Home / Self Care | Payer: MEDICAID | Source: Ambulatory Visit | Attending: Neurological Surgery | Admitting: Neurological Surgery

## 2023-01-14 ENCOUNTER — Encounter (HOSPITAL_COMMUNITY): Payer: Self-pay

## 2023-01-14 ENCOUNTER — Other Ambulatory Visit: Payer: Self-pay

## 2023-01-14 VITALS — BP 99/73 | HR 77 | Temp 97.8°F | Resp 17 | Ht 70.0 in | Wt 216.7 lb

## 2023-01-14 DIAGNOSIS — Z01818 Encounter for other preprocedural examination: Secondary | ICD-10-CM | POA: Insufficient documentation

## 2023-01-14 DIAGNOSIS — I5022 Chronic systolic (congestive) heart failure: Secondary | ICD-10-CM | POA: Diagnosis not present

## 2023-01-14 HISTORY — DX: Essential (primary) hypertension: I10

## 2023-01-14 HISTORY — DX: Depression, unspecified: F32.A

## 2023-01-14 LAB — CBC
HCT: 37.6 % — ABNORMAL LOW (ref 39.0–52.0)
Hemoglobin: 12.3 g/dL — ABNORMAL LOW (ref 13.0–17.0)
MCH: 28.2 pg (ref 26.0–34.0)
MCHC: 32.7 g/dL (ref 30.0–36.0)
MCV: 86.2 fL (ref 80.0–100.0)
Platelets: 246 10*3/uL (ref 150–400)
RBC: 4.36 MIL/uL (ref 4.22–5.81)
RDW: 14.3 % (ref 11.5–15.5)
WBC: 6.6 10*3/uL (ref 4.0–10.5)
nRBC: 0 % (ref 0.0–0.2)

## 2023-01-14 LAB — BASIC METABOLIC PANEL
Anion gap: 7 (ref 5–15)
BUN: 10 mg/dL (ref 6–20)
CO2: 25 mmol/L (ref 22–32)
Calcium: 8.5 mg/dL — ABNORMAL LOW (ref 8.9–10.3)
Chloride: 108 mmol/L (ref 98–111)
Creatinine, Ser: 1.21 mg/dL (ref 0.61–1.24)
GFR, Estimated: >60 mL/min (ref >60)
Glucose, Bld: 149 mg/dL — ABNORMAL HIGH (ref 70–99)
Potassium: 3.7 mmol/L (ref 3.5–5.1)
Sodium: 140 mmol/L (ref 135–145)

## 2023-01-14 LAB — SURGICAL PCR SCREEN
MRSA, PCR: NEGATIVE
Staphylococcus aureus: POSITIVE — AB

## 2023-01-14 NOTE — Progress Notes (Signed)
PCP - [facility], [place] (pt states he does not see a specific provider there) Cardiologist - Dr. Katrinka Blazing HF- Dr. Shirlee Latch  PPM/ICD - denies  Pt was instructed to bring his spinal cord stimulator remote  Chest x-ray - 07/16/22 EKG - 12/11/22 Stress Test - denies ECHO - 12/11/22 Cardiac Cath - 05/18/22  Sleep Study - OSA+ CPAP - nightly, pressure settings 5  DM- denies  Blood Thinner Instructions: Pt was instructed to hold Pradaxa the day before surgery and the morning of surgery. Last dose will be 11/3  Aspirin Instructions: n/a  ERAS Protcol - no, NPO   COVID TEST- n/a   Anesthesia review: yes, cadiac hx, spinal cord stimulator remote, on Pradaxa for DVTs/PEs. Fayrene Fearing is aware of this pt  Patient denies shortness of breath, fever, cough and chest pain at PAT appointment   All instructions explained to the patient, with a verbal understanding of the material. Patient agrees to go over the instructions while at home for a better understanding.  The opportunity to ask questions was provided.

## 2023-01-14 NOTE — Progress Notes (Signed)
Surgical Instructions   Your procedure is scheduled on Tuesday January 22, 2023. Report to [facility] Main Entrance "A" at 9:55 A.M., then check in with the Admitting office. Any questions or running late day of surgery: call [phone]  Questions prior to your surgery date: call [phone], Monday-Friday, 8am-4pm. If you experience any cold or flu symptoms such as cough, fever, chills, shortness of breath, etc. between now and your scheduled surgery, please notify us at the above number.     Remember:  Do not eat or drink after midnight the night before your surgery  Take these medicines the morning of surgery with A SIP OF WATER  carvedilol (COREG)   May take these medicines IF NEEDED: tiZANidine (ZANAFLEX)   Follow your surgeon's instructions on when to stop dabigatran (PRADAXA) .  If no instructions were given by your surgeon then you will need to call the office to get those instructions.    DO NOT TAKE YOUR empagliflozin (JARDIANCE) 72 HOURS PRIOR TO SURGERY, WITH THE LAST DOSE BEING 01/18/2023.     One week prior to surgery, STOP taking any Aspirin (unless otherwise instructed by your surgeon) Aleve, Naproxen, Ibuprofen, Motrin, Advil, Goody's, BC's, all herbal medications, fish oil, and non-prescription vitamins.                     Do NOT Smoke (Tobacco/Vaping) for 24 hours prior to your procedure.  If you use a CPAP at night, you may bring your mask/headgear for your overnight stay.   You will be asked to remove any contacts, glasses, piercing's, hearing aid's, dentures/partials prior to surgery. Please bring cases for these items if needed.    Patients discharged the day of surgery will not be allowed to drive home, and someone needs to stay with them for 24 hours.  SURGICAL WAITING ROOM VISITATION Patients may have no more than 2 support people in the waiting area - these visitors may rotate.   Pre-op nurse will coordinate an appropriate time for 1 ADULT support  person, who may not rotate, to accompany patient in pre-op.  Children under the age of 6 must have an adult with them who is not the patient and must remain in the main waiting area with an adult.  If the patient needs to stay at the hospital during part of their recovery, the visitor guidelines for inpatient rooms apply.  Please refer to the [facility] website for the visitor guidelines for any additional information.   If you received a COVID test during your pre-op visit  it is requested that you wear a mask when out in public, stay away from anyone that may not be feeling well and notify your surgeon if you develop symptoms. If you have been in contact with anyone that has tested positive in the last 10 days please notify you surgeon.      Pre-operative 5 CHG Bathing Instructions   You can play a key role in reducing the risk of infection after surgery. Your skin needs to be as free of germs as possible. You can reduce the number of germs on your skin by washing with CHG (chlorhexidine gluconate) soap before surgery. CHG is an antiseptic soap that kills germs and continues to kill germs even after washing.   DO NOT use if you have an allergy to chlorhexidine/CHG or antibacterial soaps. If your skin becomes reddened or irritated, stop using the CHG and notify one of our RNs at [phone].   Please  shower with the CHG soap starting 4 days before surgery using the following schedule:     Please keep in mind the following:  DO NOT shave, including legs and underarms, starting the day of your first shower.   You may shave your face at any point before/day of surgery.  Place clean sheets on your bed the day you start using CHG soap. Use a clean washcloth (not used since being washed) for each shower. DO NOT sleep with pets once you start using the CHG.   CHG Shower Instructions:  Wash your face and private area with normal soap. If you choose to wash your hair, wash first with your  normal shampoo.  After you use shampoo/soap, rinse your hair and body thoroughly to remove shampoo/soap residue.  Turn the water OFF and apply about 3 tablespoons (45 ml) of CHG soap to a CLEAN washcloth.  Apply CHG soap ONLY FROM YOUR NECK DOWN TO YOUR TOES (washing for 3-5 minutes)  DO NOT use CHG soap on face, private areas, open wounds, or sores.  Pay special attention to the area where your surgery is being performed.  If you are having back surgery, having someone wash your back for you may be helpful. Wait 2 minutes after CHG soap is applied, then you may rinse off the CHG soap.  Pat dry with a clean towel  Put on clean clothes/pajamas   If you choose to wear lotion, please use ONLY the CHG-compatible lotions on the back of this paper.   Additional instructions for the day of surgery: DO NOT APPLY any lotions, deodorants, cologne, or perfumes.   Do not bring valuables to the hospital. [facility] is not responsible for any belongings/valuables. Do not wear nail polish, gel polish, artificial nails, or any other type of covering on natural nails (fingers and toes) Do not wear jewelry or makeup Put on clean/comfortable clothes.  Please brush your teeth.  Ask your nurse before applying any prescription medications to the skin.     CHG Compatible Lotions   Aveeno Moisturizing lotion  Cetaphil Moisturizing Cream  Cetaphil Moisturizing Lotion  Clairol Herbal Essence Moisturizing Lotion, Dry Skin  Clairol Herbal Essence Moisturizing Lotion, Extra Dry Skin  Clairol Herbal Essence Moisturizing Lotion, Normal Skin  Curel Age Defying Therapeutic Moisturizing Lotion with Alpha Hydroxy  Curel Extreme Care Body Lotion  Curel Soothing Hands Moisturizing Hand Lotion  Curel Therapeutic Moisturizing Cream, Fragrance-Free  Curel Therapeutic Moisturizing Lotion, Fragrance-Free  Curel Therapeutic Moisturizing Lotion, Original Formula  Eucerin Daily Replenishing Lotion  Eucerin Dry Skin  Therapy Plus Alpha Hydroxy Crme  Eucerin Dry Skin Therapy Plus Alpha Hydroxy Lotion  Eucerin Original Crme  Eucerin Original Lotion  Eucerin Plus Crme Eucerin Plus Lotion  Eucerin TriLipid Replenishing Lotion  Keri Anti-Bacterial Hand Lotion  Keri Deep Conditioning Original Lotion Dry Skin Formula Softly Scented  Keri Deep Conditioning Original Lotion, Fragrance Free Sensitive Skin Formula  Keri Lotion Fast Absorbing Fragrance Free Sensitive Skin Formula  Keri Lotion Fast Absorbing Softly Scented Dry Skin Formula  Keri Original Lotion  Keri Skin Renewal Lotion Keri Silky Smooth Lotion  Keri Silky Smooth Sensitive Skin Lotion  Nivea Body Creamy Conditioning Oil  Nivea Body Extra Enriched Teacher, adult education Moisturizing Lotion Nivea Crme  Nivea Skin Firming Lotion  NutraDerm 30 Skin Lotion  NutraDerm Skin Lotion  NutraDerm Therapeutic Skin Cream  NutraDerm Therapeutic Skin Lotion  ProShield Protective Hand Cream  Provon moisturizing lotion  Please read over the following fact sheets that you were given.  Additional instructions for the day of surgery: DO NOT APPLY any lotions, deodorants or cologne.    Do not wear jewelry Do not bring valuables to the hospital. [facility] is not responsible for valuables/personal belongings. Put on clean/comfortable clothes.  Please brush your teeth.  Ask your nurse before applying any prescription medications to the skin.

## 2023-01-15 ENCOUNTER — Other Ambulatory Visit (HOSPITAL_COMMUNITY): Payer: Self-pay

## 2023-01-15 NOTE — Progress Notes (Signed)
Anesthesia Chart Review:  Follows with hematology for history of recurrent unprovoked DVT/PE.  Recent right lower extremity DVT 09/23/2021 while on Eliquis.  He was switched back to Coumadin but developed spontaneous large left thigh intramuscular hematoma in the setting of supratherapeutic INR greater than 7.  He was back to Eliquis.  However, given previous failure and Eliquis he was ultimately placed on Pradaxa 150 mg twice daily.  Workup has been consistent with antiphospholipid antibody syndrome.  Last seen by Dr. Leonides Schanz 10/23/2022, recommend continue Pradaxa and follow-up in 6 months.   Follows cardiology for history of nonischemic cardiomyopathy/HFrEF and HTN. 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  Echo 12/11/22 showed EF 30-35%, grade 1dd. Last seen by Dr. Shirlee Latch 12/11/22. He was continued on Entresto 97/103 mg bid, Jardiance 10 mg daily, spironolactone 25 mg daily, prn Lasix. Coreg was increased to 12.5 mg daily. Pt recently underwent removal of spinal cord stimulator so that he could have cardiac MRI to determine need for ICD. MRI currently scheduled for 01/28/23. I reached out to Dr. Shirlee Latch and he advised cardiac MRI did not need to be completed prior to surgery, acceptable to proceed, moderate risk from cardiac standpoint.    Patient reports last dose Pradaxa 01/20/23.   CMP and CBC 10/23/2022 reviewed, creatinine elevated 1.72 (baseline appears to be ~1.4), otherwise unremarkable.   Patient will need to have surgery evaluation.   EKG 09/05/2022: NSR.  Rate 78.  Minimal voltage criteria for LVH, may be normal variant.  T wave abnormality, consider lateral ischemia.  Prolonged QT (QTcB 510)   TTE 12/11/22:  1. Left ventricular ejection fraction, by estimation, is 30 to 35%. The  left ventricle has moderately decreased function. The left ventricle  demonstrates global hypokinesis. The left ventricular internal cavity size  was mildly dilated. Left ventricular  diastolic parameters are  consistent with Grade I diastolic dysfunction  (impaired relaxation).   2. Right ventricular systolic function is normal. The right ventricular  size is normal. Tricuspid regurgitation signal is inadequate for assessing  PA pressure.   3. Left atrial size was mildly dilated.   4. The mitral valve is normal in structure. No evidence of mitral valve  regurgitation. No evidence of mitral stenosis.   5. The aortic valve is tricuspid. Aortic valve regurgitation is not  visualized. No aortic stenosis is present.   6. The inferior vena cava is normal in size with greater than 50%  respiratory variability, suggesting right atrial pressure of 3 mmHg.   Right/left heart cath 05/18/2022:    Mid LAD lesion is 20% stenosed.   1. Mild nonobstructive CAD.  2. Normal PCWP and RA pressure, mildly elevated LVEDP.  3. Preserved cardiac output.     Well-compensated nonischemic cardiomyopathy.  Will need cardiac MRI when battery is changed in his spinal stimulator to make it MRI compatible.       Zannie Cove [facility] Short Stay Center/Anesthesiology Phone [phone] 01/15/2023 2:41 PM

## 2023-01-15 NOTE — Progress Notes (Signed)
Opened in error; duplicate.

## 2023-01-15 NOTE — Progress Notes (Signed)
Paramedicine Encounter    Patient ID: Justin [name], male    DOB: 1965-07-13, 57 y.o.   MRN: [id number]   Came by today for a visit to med rec pill box prior to his out of town trip, but he said airline wanted him to keep them in the bottles. I did print off a list for him to keep in pill bag with the bottles.  So no pill box done today. Confirmed he had all of his meds and he did.   Next week when he returns he will go to surgery the next day for his back. He reports he is to hold pradaxa.  I am off next tues and thur so I will f/u with him the next week.    Patient Care Team: Pa, [facility] as PCP - General (Internal Medicine) Christell Constant, MD as PCP - Cardiology (Cardiology)  Patient Active Problem List   Diagnosis Date Noted   Pain in right leg 08/06/2022   Fatigue 07/31/2022   History of DVT (deep vein thrombosis) 07/17/2022   Tremors of nervous system 07/17/2022   Nonischemic cardiomyopathy (HCC) 05/18/2022   Chronic systolic CHF (congestive heart failure) (HCC) 05/18/2022   Snoring 04/04/2022   Normocytic anemia 12/22/2021   History of pulmonary embolism 10/19/2021   Status post lumbar spinal fusion 01/03/2018   Lumbar stenosis 12/23/2017   Spondylolisthesis, lumbar region 06/25/2017   Achilles tendinitis of left lower extremity 12/07/2016   Achilles tendinitis, left leg 05/17/2016   Obstructive sleep apnea on CPAP 03/07/2015   Achilles rupture, left 10/21/2014   Bipolar disorder, unspecified (HCC) 01/07/2013   Headache 01/07/2013   Chronic anticoagulation 01/04/2013    Current Outpatient Medications:    carvedilol (COREG) 12.5 MG tablet, Take 1 tablet (12.5 mg total) by mouth 2 (two) times daily., Disp: 60 tablet, Rfl: 11   dabigatran (PRADAXA) 150 MG CAPS capsule, TAKE 1 CAPSULE(150 MG) BY MOUTH TWICE DAILY, Disp: 180 capsule, Rfl: 3   doxepin (SINEQUAN) 75 MG capsule, Take 75 mg by mouth at bedtime., Disp: , Rfl:    empagliflozin (JARDIANCE) 10 MG  TABS tablet, Take 1 tablet (10 mg total) by mouth daily before breakfast., Disp: 30 tablet, Rfl: 11   furosemide (LASIX) 40 MG tablet, Take 1 tablet (40 mg total) by mouth as needed for fluid or edema (take if a weight gain of 3 lbs within 24 hours or 5 lbs in a week.)., Disp: 90 tablet, Rfl: 3   mirtazapine (REMERON) 45 MG tablet, Take 45 mg by mouth at bedtime., Disp: , Rfl:    potassium chloride SA (KLOR-CON M) 20 MEQ tablet, TAKE 2 TABLETS(40 MEQ) BY MOUTH TWICE DAILY FOR 1 DAY THEN TAKE 2 TABLETS(40 MEQ) BY MOUTH DAILY, Disp: 64 tablet, Rfl: 0   pravastatin (PRAVACHOL) 10 MG tablet, Take 10 mg by mouth at bedtime., Disp: , Rfl:    QUEtiapine (SEROQUEL XR) 400 MG 24 hr tablet, Take 800 mg by mouth at bedtime., Disp: , Rfl:    sacubitril-valsartan (ENTRESTO) 97-103 MG, Take 1 tablet by mouth 2 (two) times daily., Disp: 60 tablet, Rfl: 11   spironolactone (ALDACTONE) 25 MG tablet, Take 1 tablet (25 mg total) by mouth daily., Disp: 30 tablet, Rfl: 11   tiZANidine (ZANAFLEX) 4 MG tablet, Take 4 mg by mouth 2 (two) times daily as needed for muscle spasms., Disp: , Rfl:  Allergies  Allergen Reactions   Lyrica [Pregabalin] Other (See Comments)    Hallucinations  Social History   Socioeconomic History   Marital status: Significant Other    Spouse name: Not on file   Number of children: 4   Years of education: Not on file   Highest education level: Not on file  Occupational History   Occupation: disability  Tobacco Use   Smoking status: Never    Passive exposure: Past   Smokeless tobacco: Never  Vaping Use   Vaping status: Never Used  Substance and Sexual Activity   Alcohol use: No   Drug use: No   Sexual activity: Not on file  Other Topics Concern   Not on file  Social History Narrative   FROM [place], [place]    Lives in a 2 story apartment.  His cousin is currently staying with him.  Has 4 children.  On disability for the past 10 years for pulmonary embolism, bipolar disease.   Education: high school.   Social Determinants of Health   Financial Resource Strain: Not on file  Food Insecurity: No Food Insecurity (07/17/2022)   Hunger Vital Sign    Worried About Running Out of Food in the Last Year: Never true    Ran Out of Food in the Last Year: Never true  Transportation Needs: No Transportation Needs (07/17/2022)   PRAPARE - Administrator, Civil Service (Medical): No    Lack of Transportation (Non-Medical): No  Physical Activity: Not on file  Stress: Not on file  Social Connections: Unknown (07/31/2021)   Received from [facility]   Social Network    Social Network: Not on file  Intimate Partner Violence: Unknown (07/17/2022)   Humiliation, Afraid, Rape, and Kick questionnaire    Fear of Current or Ex-Partner: No    Emotionally Abused: No    Physically Abused: Not on file    Sexually Abused: Not on file    Physical Exam      Future Appointments  Date Time Provider Department Center  01/28/2023  3:00 PM [facility] 1 [facility]  02/06/2023 11:40 AM Laurey Morale, MD [facility] None  04/25/2023  2:30 PM [facility] LAB [facility] None  04/25/2023  3:00 PM Jaci Standard, MD [facility] None       Kerry Hough, Paramedic [phone] [facility] Paramedic  01/15/23

## 2023-01-15 NOTE — Anesthesia Preprocedure Evaluation (Addendum)
Anesthesia Evaluation  Patient identified by MRN, date of birth, ID band Patient awake    Reviewed: Allergy & Precautions, NPO status , Patient's Chart, lab work & pertinent test results  History of Anesthesia Complications Negative for: history of anesthetic complications  Airway Mallampati: II  TM Distance: >3 FB Neck ROM: Full    Dental  (+) Teeth Intact, Dental Advisory Given,    Pulmonary neg shortness of breath, sleep apnea , neg COPD, neg recent URI, PE   breath sounds clear to auscultation       Cardiovascular hypertension, (-) angina + CAD and +CHF  (-) Past MI  Rhythm:Regular   1. Left ventricular ejection fraction, by estimation, is 30 to 35%. The  left ventricle has moderately decreased function. The left ventricle  demonstrates global hypokinesis. The left ventricular internal cavity size  was mildly dilated. Left ventricular  diastolic parameters are consistent with Grade I diastolic dysfunction  (impaired relaxation).   2. Right ventricular systolic function is normal. The right ventricular  size is normal. Tricuspid regurgitation signal is inadequate for assessing  PA pressure.   3. Left atrial size was mildly dilated.   4. The mitral valve is normal in structure. No evidence of mitral valve  regurgitation. No evidence of mitral stenosis.   5. The aortic valve is tricuspid. Aortic valve regurgitation is not  visualized. No aortic stenosis is present.   6. The inferior vena cava is normal in size with greater than 50%  respiratory variability, suggesting right atrial pressure of 3 mmHg.     Conclusion      Mid LAD lesion is 20% stenosed.   1. Mild nonobstructive CAD.  2. Normal PCWP and RA pressure, mildly elevated LVEDP.  3. Preserved cardiac output.     Well-compensated nonischemic cardiomyopathy.  Will need cardiac MRI when battery is changed in his spinal stimulator to make it MRI compatible.        Neuro/Psych  Headaches PSYCHIATRIC DISORDERS  Depression Bipolar Disorder      GI/Hepatic negative GI ROS, Neg liver ROS,,,  Endo/Other  negative endocrine ROS    Renal/GU negative Renal ROSLab Results      Component                Value               Date                      NA                       140                 01/14/2023                K                        3.7                 01/14/2023                CO2                      25                  01/14/2023                GLUCOSE  149 (H)             01/14/2023                BUN                      10                  01/14/2023                CREATININE               1.21                01/14/2023                CALCIUM                  8.5 (L)             01/14/2023                EGFR                     75                  02/26/2022                GFRNONAA                 >60                 01/14/2023                Musculoskeletal   Abdominal   Peds  Hematology  (+) Blood dyscrasia, anemia Lab Results      Component                Value               Date                      WBC                      6.6                 01/14/2023                HGB                      12.3 (L)            01/14/2023                HCT                      37.6 (L)            01/14/2023                MCV                      86.2                01/14/2023                PLT                      246  01/14/2023              Anesthesia Other Findings   Reproductive/Obstetrics                              Anesthesia Physical Anesthesia Plan  ASA: 3  Anesthesia Plan: General   Post-op Pain Management: Ofirmev IV (intra-op)*   Induction: Intravenous  PONV Risk Score and Plan: Ondansetron and Dexamethasone  Airway Management Planned: Oral ETT  Additional Equipment: None  Intra-op Plan:   Post-operative Plan: Extubation in OR  Informed Consent: I  have reviewed the patients History and Physical, chart, labs and discussed the procedure including the risks, benefits and alternatives for the proposed anesthesia with the patient or authorized representative who has indicated his/her understanding and acceptance.     Dental advisory given  Plan Discussed with: CRNA  Anesthesia Plan Comments: (PAT note by Antionette Poles, PA-C: Follows with hematology for history of recurrent unprovoked DVT/PE.  Recent right lower extremity DVT 09/23/2021 while on Eliquis.  He was switched back to Coumadin but developed spontaneous large left thigh intramuscular hematoma in the setting of supratherapeutic INR greater than 7.  He was back to Eliquis.  However, given previous failure and Eliquis he was ultimately placed on Pradaxa 150 mg twice daily.  Workup has been consistent with antiphospholipid antibody syndrome.  Last seen by Dr. Leonides Schanz 10/23/2022, recommend continue Pradaxa and follow-up in 6 months.   Follows cardiology for history of nonischemic cardiomyopathy/HFrEF and HTN. 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  Echo 12/11/22 showed EF 30-35%, grade 1dd. Last seen by Dr. Shirlee Latch 12/11/22. He was continued on Entresto 97/103 mg bid, Jardiance 10 mg daily, spironolactone 25 mg daily, prn Lasix. Coreg was increased to 12.5 mg daily. Pt recently underwent removal of spinal cord stimulator so that he could have cardiac MRI to determine need for ICD. MRI currently scheduled for 01/28/23. I reached out to Dr. Shirlee Latch and he advised cardiac MRI did not need to be completed prior to surgery, acceptable to proceed, moderate risk from cardiac standpoint.    Patient reports last dose Pradaxa 01/20/23.   CMP and CBC 10/23/2022 reviewed, creatinine elevated 1.72 (baseline appears to be ~1.4), otherwise unremarkable.   Patient will need to have surgery evaluation.   EKG 09/05/2022: NSR.  Rate 78.  Minimal voltage criteria for LVH, may be normal variant.  T wave abnormality,  consider lateral ischemia.  Prolonged QT (QTcB 510)   TTE 12/11/22:  1. Left ventricular ejection fraction, by estimation, is 30 to 35%. The  left ventricle has moderately decreased function. The left ventricle  demonstrates global hypokinesis. The left ventricular internal cavity size  was mildly dilated. Left ventricular  diastolic parameters are consistent with Grade I diastolic dysfunction  (impaired relaxation).   2. Right ventricular systolic function is normal. The right ventricular  size is normal. Tricuspid regurgitation signal is inadequate for assessing  PA pressure.   3. Left atrial size was mildly dilated.   4. The mitral valve is normal in structure. No evidence of mitral valve  regurgitation. No evidence of mitral stenosis.   5. The aortic valve is tricuspid. Aortic valve regurgitation is not  visualized. No aortic stenosis is present.   6. The inferior vena cava is normal in size with greater than 50%  respiratory variability, suggesting right atrial pressure of 3 mmHg.   Right/left heart cath 05/18/2022:    Mid LAD lesion is  20% stenosed.   1. Mild nonobstructive CAD.  2. Normal PCWP and RA pressure, mildly elevated LVEDP.  3. Preserved cardiac output.     Well-compensated nonischemic cardiomyopathy.  Will need cardiac MRI when battery is changed in his spinal stimulator to make it MRI compatible.     )         Anesthesia Quick Evaluation

## 2023-01-21 ENCOUNTER — Other Ambulatory Visit: Payer: Self-pay | Admitting: Neurological Surgery

## 2023-01-22 ENCOUNTER — Other Ambulatory Visit: Payer: Self-pay

## 2023-01-22 ENCOUNTER — Observation Stay (HOSPITAL_COMMUNITY)
Admission: RE | Admit: 2023-01-22 | Discharge: 2023-01-22 | Disposition: A | Discharge status: Home / Self Care | Payer: 59 | Attending: Neurological Surgery | Admitting: Neurological Surgery

## 2023-01-22 ENCOUNTER — Encounter (HOSPITAL_COMMUNITY): Payer: Self-pay | Admitting: Neurological Surgery

## 2023-01-22 ENCOUNTER — Telehealth (HOSPITAL_COMMUNITY): Payer: Self-pay | Admitting: *Deleted

## 2023-01-22 ENCOUNTER — Encounter (HOSPITAL_COMMUNITY)
Admission: RE | Disposition: A | Discharge status: Home / Self Care | Payer: Self-pay | Source: Home / Self Care | Attending: Neurological Surgery

## 2023-01-22 ENCOUNTER — Inpatient Hospital Stay (HOSPITAL_COMMUNITY): Payer: 59

## 2023-01-22 ENCOUNTER — Inpatient Hospital Stay (HOSPITAL_COMMUNITY): Payer: 59 | Admitting: Anesthesiology

## 2023-01-22 ENCOUNTER — Inpatient Hospital Stay (HOSPITAL_COMMUNITY): Payer: 59 | Admitting: Physician Assistant

## 2023-01-22 DIAGNOSIS — G4730 Sleep apnea, unspecified: Secondary | ICD-10-CM | POA: Insufficient documentation

## 2023-01-22 DIAGNOSIS — Z820 Family history of epilepsy and other diseases of the nervous system: Secondary | ICD-10-CM | POA: Diagnosis not present

## 2023-01-22 DIAGNOSIS — I428 Other cardiomyopathies: Secondary | ICD-10-CM | POA: Diagnosis present

## 2023-01-22 DIAGNOSIS — Z79899 Other long term (current) drug therapy: Secondary | ICD-10-CM | POA: Diagnosis not present

## 2023-01-22 DIAGNOSIS — Z823 Family history of stroke: Secondary | ICD-10-CM | POA: Diagnosis not present

## 2023-01-22 DIAGNOSIS — I509 Heart failure, unspecified: Secondary | ICD-10-CM | POA: Diagnosis not present

## 2023-01-22 DIAGNOSIS — I2510 Atherosclerotic heart disease of native coronary artery without angina pectoris: Secondary | ICD-10-CM | POA: Diagnosis not present

## 2023-01-22 DIAGNOSIS — T8484XA Pain due to internal orthopedic prosthetic devices, implants and grafts, initial encounter: Secondary | ICD-10-CM | POA: Diagnosis present

## 2023-01-22 DIAGNOSIS — Z7902 Long term (current) use of antithrombotics/antiplatelets: Secondary | ICD-10-CM | POA: Diagnosis not present

## 2023-01-22 DIAGNOSIS — Z86711 Personal history of pulmonary embolism: Secondary | ICD-10-CM | POA: Diagnosis not present

## 2023-01-22 DIAGNOSIS — F319 Bipolar disorder, unspecified: Secondary | ICD-10-CM | POA: Diagnosis present

## 2023-01-22 DIAGNOSIS — L720 Epidermal cyst: Secondary | ICD-10-CM | POA: Diagnosis not present

## 2023-01-22 DIAGNOSIS — Z86718 Personal history of other venous thrombosis and embolism: Secondary | ICD-10-CM | POA: Diagnosis not present

## 2023-01-22 DIAGNOSIS — Y848 Other medical procedures as the cause of abnormal reaction of the patient, or of later complication, without mention of misadventure at the time of the procedure: Secondary | ICD-10-CM | POA: Diagnosis present

## 2023-01-22 DIAGNOSIS — Z7901 Long term (current) use of anticoagulants: Secondary | ICD-10-CM

## 2023-01-22 DIAGNOSIS — I5022 Chronic systolic (congestive) heart failure: Secondary | ICD-10-CM | POA: Diagnosis present

## 2023-01-22 DIAGNOSIS — Z8249 Family history of ischemic heart disease and other diseases of the circulatory system: Secondary | ICD-10-CM

## 2023-01-22 DIAGNOSIS — Y828 Other medical devices associated with adverse incidents: Secondary | ICD-10-CM | POA: Diagnosis not present

## 2023-01-22 DIAGNOSIS — J449 Chronic obstructive pulmonary disease, unspecified: Secondary | ICD-10-CM | POA: Diagnosis not present

## 2023-01-22 DIAGNOSIS — I110 Hypertensive heart disease with heart failure: Secondary | ICD-10-CM | POA: Diagnosis present

## 2023-01-22 DIAGNOSIS — Z7984 Long term (current) use of oral hypoglycemic drugs: Secondary | ICD-10-CM | POA: Diagnosis not present

## 2023-01-22 DIAGNOSIS — M5416 Radiculopathy, lumbar region: Secondary | ICD-10-CM | POA: Diagnosis present

## 2023-01-22 DIAGNOSIS — L723 Sebaceous cyst: Secondary | ICD-10-CM | POA: Diagnosis present

## 2023-01-22 DIAGNOSIS — Z8279 Family history of other congenital malformations, deformations and chromosomal abnormalities: Secondary | ICD-10-CM

## 2023-01-22 DIAGNOSIS — D1722 Benign lipomatous neoplasm of skin and subcutaneous tissue of left arm: Secondary | ICD-10-CM | POA: Diagnosis present

## 2023-01-22 HISTORY — PX: LIPOMA EXCISION: SHX5283

## 2023-01-22 HISTORY — PX: HARDWARE REMOVAL: SHX979

## 2023-01-22 SURGERY — REMOVAL, HARDWARE
Anesthesia: General | Site: Spine Lumbar | Laterality: Right

## 2023-01-22 MED ORDER — ACETAMINOPHEN 160 MG/5ML PO SOLN: 1000.0000 mg | Freq: Once | ORAL | Status: DC | PRN

## 2023-01-22 MED ORDER — SUGAMMADEX SODIUM 200 MG/2ML IV SOLN
INTRAVENOUS | Status: DC | PRN
  Administered 2023-01-22: 200 mg via INTRAVENOUS

## 2023-01-22 MED ORDER — EPHEDRINE 5 MG/ML INJ
INTRAVENOUS | Status: AC
  Filled 2023-01-22: qty 5

## 2023-01-22 MED ORDER — DEXAMETHASONE SODIUM PHOSPHATE 10 MG/ML IJ SOLN
INTRAMUSCULAR | Status: DC | PRN
  Administered 2023-01-22: 10 mg via INTRAVENOUS

## 2023-01-22 MED ORDER — FLEET ENEMA RE ENEM: 1.0000 | ENEMA | Freq: Once | RECTAL | Status: DC | PRN

## 2023-01-22 MED ORDER — OXYCODONE HCL 5 MG/5ML PO SOLN
5.0000 mg | Freq: Once | ORAL | Status: DC | PRN
Start: 2023-01-22 — End: 2023-01-22

## 2023-01-22 MED ORDER — PHENOL 1.4 % MT LIQD: 1.0000 | OROMUCOSAL | Status: DC | PRN

## 2023-01-22 MED ORDER — CHLORHEXIDINE GLUCONATE CLOTH 2 % EX PADS: 6.0000 | MEDICATED_PAD | Freq: Every day | CUTANEOUS | Status: DC

## 2023-01-22 MED ORDER — LIDOCAINE 2% (20 MG/ML) 5 ML SYRINGE
INTRAMUSCULAR | Status: AC
  Filled 2023-01-22: qty 5

## 2023-01-22 MED ORDER — DOCUSATE SODIUM 100 MG PO CAPS: 100.0000 mg | ORAL_CAPSULE | Freq: Two times a day (BID) | ORAL | Status: DC

## 2023-01-22 MED ORDER — SODIUM CHLORIDE 0.9% FLUSH
3.0000 mL | INTRAVENOUS | Status: DC | PRN
Start: 2023-01-22 — End: 2023-01-23

## 2023-01-22 MED ORDER — MIDAZOLAM HCL 2 MG/2ML IJ SOLN
INTRAMUSCULAR | Status: AC
  Filled 2023-01-22: qty 2

## 2023-01-22 MED ORDER — MUPIROCIN 2 % EX OINT
TOPICAL_OINTMENT | CUTANEOUS | Status: AC
  Filled 2023-01-22: qty 22

## 2023-01-22 MED ORDER — FENTANYL CITRATE (PF) 250 MCG/5ML IJ SOLN
INTRAMUSCULAR | Status: AC
  Filled 2023-01-22: qty 5

## 2023-01-22 MED ORDER — ACETAMINOPHEN 500 MG PO TABS: 1000.0000 mg | ORAL_TABLET | Freq: Once | ORAL | Status: DC | PRN

## 2023-01-22 MED ORDER — PHENYLEPHRINE 80 MCG/ML (10ML) SYRINGE FOR IV PUSH (FOR BLOOD PRESSURE SUPPORT)
PREFILLED_SYRINGE | INTRAVENOUS | Status: DC | PRN
  Administered 2023-01-22: 80 ug via INTRAVENOUS
  Administered 2023-01-22: 160 ug via INTRAVENOUS
  Administered 2023-01-22 (×5): 80 ug via INTRAVENOUS

## 2023-01-22 MED ORDER — OXYCODONE-ACETAMINOPHEN 5-325 MG PO TABS
1.0000 | ORAL_TABLET | ORAL | Status: DC | PRN
  Administered 2023-01-22: 1 via ORAL
  Filled 2023-01-22: qty 1

## 2023-01-22 MED ORDER — ONDANSETRON HCL 4 MG/2ML IJ SOLN
INTRAMUSCULAR | Status: AC
  Filled 2023-01-22: qty 2

## 2023-01-22 MED ORDER — MIDAZOLAM HCL 2 MG/2ML IJ SOLN
INTRAMUSCULAR | Status: DC | PRN
  Administered 2023-01-22: 2 mg via INTRAVENOUS

## 2023-01-22 MED ORDER — SPIRONOLACTONE 25 MG PO TABS
25.0000 mg | ORAL_TABLET | Freq: Every day | ORAL | Status: DC
  Administered 2023-01-22: 25 mg via ORAL
  Filled 2023-01-22: qty 1

## 2023-01-22 MED ORDER — GLYCOPYRROLATE 0.2 MG/ML IJ SOLN
INTRAMUSCULAR | Status: DC | PRN
  Administered 2023-01-22: .2 mg via INTRAVENOUS

## 2023-01-22 MED ORDER — PROPOFOL 10 MG/ML IV BOLUS
INTRAVENOUS | Status: AC
  Filled 2023-01-22: qty 20

## 2023-01-22 MED ORDER — DEXMEDETOMIDINE HCL IN NACL 80 MCG/20ML IV SOLN
INTRAVENOUS | Status: DC | PRN
  Administered 2023-01-22: 12 ug via INTRAVENOUS

## 2023-01-22 MED ORDER — PRAVASTATIN SODIUM 10 MG PO TABS: 10.0000 mg | ORAL_TABLET | Freq: Every day | ORAL | Status: DC

## 2023-01-22 MED ORDER — PROPOFOL 10 MG/ML IV BOLUS
INTRAVENOUS | Status: DC | PRN
  Administered 2023-01-22: 90 mg via INTRAVENOUS
  Administered 2023-01-22: 20 mg via INTRAVENOUS

## 2023-01-22 MED ORDER — SODIUM CHLORIDE 0.9 % IV SOLN: 250.0000 mL | INTRAVENOUS | Status: DC

## 2023-01-22 MED ORDER — GLYCOPYRROLATE PF 0.2 MG/ML IJ SOSY
PREFILLED_SYRINGE | INTRAMUSCULAR | Status: AC
  Filled 2023-01-22: qty 1

## 2023-01-22 MED ORDER — PHENYLEPHRINE HCL (PRESSORS) 10 MG/ML IV SOLN
INTRAVENOUS | Status: AC
  Filled 2023-01-22: qty 1

## 2023-01-22 MED ORDER — POLYETHYLENE GLYCOL 3350 17 G PO PACK: 17.0000 g | PACK | Freq: Every day | ORAL | Status: DC | PRN

## 2023-01-22 MED ORDER — POTASSIUM CHLORIDE CRYS ER 20 MEQ PO TBCR: 40.0000 meq | EXTENDED_RELEASE_TABLET | Freq: Every day | ORAL | Status: DC

## 2023-01-22 MED ORDER — OXYCODONE HCL 5 MG PO TABS: 5.0000 mg | ORAL_TABLET | Freq: Once | ORAL | Status: DC | PRN

## 2023-01-22 MED ORDER — VASOPRESSIN 20 UNIT/ML IV SOLN
INTRAVENOUS | Status: AC
  Filled 2023-01-22: qty 1

## 2023-01-22 MED ORDER — DEXAMETHASONE SODIUM PHOSPHATE 10 MG/ML IJ SOLN
INTRAMUSCULAR | Status: AC
  Filled 2023-01-22: qty 1

## 2023-01-22 MED ORDER — ONDANSETRON HCL 4 MG/2ML IJ SOLN
4.0000 mg | Freq: Four times a day (QID) | INTRAMUSCULAR | Status: DC | PRN
Start: 2023-01-22 — End: 2023-01-23

## 2023-01-22 MED ORDER — CHLORHEXIDINE GLUCONATE CLOTH 2 % EX PADS: 6.0000 | MEDICATED_PAD | Freq: Once | CUTANEOUS | Status: DC

## 2023-01-22 MED ORDER — ROCURONIUM BROMIDE 10 MG/ML (PF) SYRINGE
PREFILLED_SYRINGE | INTRAVENOUS | Status: DC | PRN
  Administered 2023-01-22: 60 mg via INTRAVENOUS

## 2023-01-22 MED ORDER — MENTHOL 3 MG MT LOZG: 1.0000 | LOZENGE | OROMUCOSAL | Status: DC | PRN

## 2023-01-22 MED ORDER — FUROSEMIDE 40 MG PO TABS: 40.0000 mg | ORAL_TABLET | Freq: Every day | ORAL | Status: DC | PRN

## 2023-01-22 MED ORDER — PHENYLEPHRINE 80 MCG/ML (10ML) SYRINGE FOR IV PUSH (FOR BLOOD PRESSURE SUPPORT)
PREFILLED_SYRINGE | INTRAVENOUS | Status: AC
  Filled 2023-01-22: qty 10

## 2023-01-22 MED ORDER — DOXEPIN HCL 25 MG PO CAPS
75.0000 mg | ORAL_CAPSULE | Freq: Every day | ORAL | Status: DC
  Filled 2023-01-22: qty 1
  Filled 2023-01-22: qty 3

## 2023-01-22 MED ORDER — VASOPRESSIN 20 UNIT/ML IV SOLN
INTRAVENOUS | Status: DC | PRN
  Administered 2023-01-22 (×4): 1 [IU] via INTRAVENOUS

## 2023-01-22 MED ORDER — THROMBIN 5000 UNITS EX SOLR
CUTANEOUS | Status: AC
  Filled 2023-01-22: qty 5000

## 2023-01-22 MED ORDER — TIZANIDINE HCL 4 MG PO TABS
4.0000 mg | ORAL_TABLET | Freq: Two times a day (BID) | ORAL | Status: DC | PRN
  Administered 2023-01-22: 4 mg via ORAL
  Filled 2023-01-22: qty 1

## 2023-01-22 MED ORDER — MIRTAZAPINE 30 MG PO TABS
45.0000 mg | ORAL_TABLET | Freq: Every day | ORAL | Status: DC
  Filled 2023-01-22: qty 1

## 2023-01-22 MED ORDER — CEFAZOLIN SODIUM-DEXTROSE 2-4 GM/100ML-% IV SOLN
2.0000 g | Freq: Three times a day (TID) | INTRAVENOUS | Status: DC
  Administered 2023-01-22: 2 g via INTRAVENOUS
  Filled 2023-01-22: qty 100

## 2023-01-22 MED ORDER — QUETIAPINE FUMARATE ER 300 MG PO TB24
800.0000 mg | ORAL_TABLET | Freq: Every day | ORAL | Status: DC
  Filled 2023-01-22: qty 1

## 2023-01-22 MED ORDER — EMPAGLIFLOZIN 10 MG PO TABS: 10.0000 mg | ORAL_TABLET | Freq: Every day | ORAL | Status: DC

## 2023-01-22 MED ORDER — ACETAMINOPHEN 325 MG PO TABS: 650.0000 mg | ORAL_TABLET | ORAL | Status: DC | PRN

## 2023-01-22 MED ORDER — EPHEDRINE SULFATE-NACL 50-0.9 MG/10ML-% IV SOSY
PREFILLED_SYRINGE | INTRAVENOUS | Status: DC | PRN
  Administered 2023-01-22: 10 mg via INTRAVENOUS
  Administered 2023-01-22: 5 mg via INTRAVENOUS
  Administered 2023-01-22: 10 mg via INTRAVENOUS

## 2023-01-22 MED ORDER — LIDOCAINE 2% (20 MG/ML) 5 ML SYRINGE
INTRAMUSCULAR | Status: DC | PRN
  Administered 2023-01-22: 60 mg via INTRAVENOUS

## 2023-01-22 MED ORDER — MUPIROCIN 2 % EX OINT
1.0000 | TOPICAL_OINTMENT | Freq: Two times a day (BID) | CUTANEOUS | Status: DC
  Administered 2023-01-22: 1 via NASAL

## 2023-01-22 MED ORDER — ONDANSETRON HCL 4 MG/2ML IJ SOLN
INTRAMUSCULAR | Status: DC | PRN
  Administered 2023-01-22: 4 mg via INTRAVENOUS

## 2023-01-22 MED ORDER — ACETAMINOPHEN 10 MG/ML IV SOLN: 1000.0000 mg | Freq: Once | INTRAVENOUS | Status: DC | PRN

## 2023-01-22 MED ORDER — LIDOCAINE-EPINEPHRINE 1 %-1:100000 IJ SOLN
INTRAMUSCULAR | Status: DC | PRN
  Administered 2023-01-22: 9 mL
  Administered 2023-01-22: 8 mL

## 2023-01-22 MED ORDER — FENTANYL CITRATE (PF) 250 MCG/5ML IJ SOLN
INTRAMUSCULAR | Status: DC | PRN
  Administered 2023-01-22 (×2): 50 ug via INTRAVENOUS

## 2023-01-22 MED ORDER — SACUBITRIL-VALSARTAN 97-103 MG PO TABS
1.0000 | ORAL_TABLET | Freq: Two times a day (BID) | ORAL | Status: DC
  Filled 2023-01-22: qty 1

## 2023-01-22 MED ORDER — BUPIVACAINE HCL (PF) 0.5 % IJ SOLN
INTRAMUSCULAR | Status: DC | PRN
  Administered 2023-01-22: 9 mL
  Administered 2023-01-22: 8 mL

## 2023-01-22 MED ORDER — BUPIVACAINE HCL (PF) 0.5 % IJ SOLN
INTRAMUSCULAR | Status: AC
  Filled 2023-01-22: qty 30

## 2023-01-22 MED ORDER — SODIUM CHLORIDE 0.9% FLUSH: 3.0000 mL | Freq: Two times a day (BID) | INTRAVENOUS | Status: DC

## 2023-01-22 MED ORDER — 0.9 % SODIUM CHLORIDE (POUR BTL) OPTIME
TOPICAL | Status: DC | PRN
  Administered 2023-01-22: 1000 mL

## 2023-01-22 MED ORDER — BISACODYL 10 MG RE SUPP: 10.0000 mg | Freq: Every day | RECTAL | Status: DC | PRN

## 2023-01-22 MED ORDER — CARVEDILOL 12.5 MG PO TABS: 12.5000 mg | ORAL_TABLET | Freq: Two times a day (BID) | ORAL | Status: DC

## 2023-01-22 MED ORDER — ACETAMINOPHEN 650 MG RE SUPP: 650.0000 mg | RECTAL | Status: DC | PRN

## 2023-01-22 MED ORDER — SENNA 8.6 MG PO TABS
1.0000 | ORAL_TABLET | Freq: Two times a day (BID) | ORAL | Status: DC
Start: 2023-01-22 — End: 2023-01-23

## 2023-01-22 MED ORDER — ORAL CARE MOUTH RINSE: 15.0000 mL | Freq: Once | OROMUCOSAL | Status: AC

## 2023-01-22 MED ORDER — LIDOCAINE-EPINEPHRINE 1 %-1:100000 IJ SOLN
INTRAMUSCULAR | Status: AC
Start: 2023-01-22 — End: ?
  Filled 2023-01-22: qty 1

## 2023-01-22 MED ORDER — ONDANSETRON HCL 4 MG PO TABS: 4.0000 mg | ORAL_TABLET | Freq: Four times a day (QID) | ORAL | Status: DC | PRN

## 2023-01-22 MED ORDER — CEFAZOLIN SODIUM-DEXTROSE 2-4 GM/100ML-% IV SOLN
2.0000 g | INTRAVENOUS | Status: AC
  Administered 2023-01-22: 2 g via INTRAVENOUS
  Filled 2023-01-22: qty 100

## 2023-01-22 MED ORDER — THROMBIN 5000 UNITS EX SOLR: OROMUCOSAL | Status: DC | PRN

## 2023-01-22 MED ORDER — ROCURONIUM BROMIDE 10 MG/ML (PF) SYRINGE
PREFILLED_SYRINGE | INTRAVENOUS | Status: AC
  Filled 2023-01-22: qty 10

## 2023-01-22 MED ORDER — HYDROCODONE-ACETAMINOPHEN 5-325 MG PO TABS: 1.0000 | ORAL_TABLET | Freq: Four times a day (QID) | ORAL | 0 refills | Status: DC | PRN

## 2023-01-22 MED ORDER — FENTANYL CITRATE (PF) 100 MCG/2ML IJ SOLN: 25.0000 ug | INTRAMUSCULAR | Status: DC | PRN

## 2023-01-22 MED ORDER — LACTATED RINGERS IV SOLN
INTRAVENOUS | Status: DC
Start: 2023-01-22 — End: 2023-01-22

## 2023-01-22 MED ORDER — CHLORHEXIDINE GLUCONATE 0.12 % MT SOLN
15.0000 mL | Freq: Once | OROMUCOSAL | Status: AC
  Administered 2023-01-22: 15 mL via OROMUCOSAL
  Filled 2023-01-22: qty 15

## 2023-01-22 SURGICAL SUPPLY — 51 items
ADH SKN CLS APL DERMABOND .7 (GAUZE/BANDAGES/DRESSINGS) ×4
APL SKNCLS STERI-STRIP NONHPOA (GAUZE/BANDAGES/DRESSINGS)
BAG COUNTER SPONGE SURGICOUNT (BAG) ×2 IMPLANT
BAG SPNG CNTER NS LX DISP (BAG) ×2
BENZOIN TINCTURE PRP APPL 2/3 (GAUZE/BANDAGES/DRESSINGS) IMPLANT
BLADE CLIPPER SURG (BLADE) IMPLANT
CANISTER SUCT 3000ML PPV (MISCELLANEOUS) ×2 IMPLANT
DERMABOND ADVANCED .7 DNX12 (GAUZE/BANDAGES/DRESSINGS) IMPLANT
DRAPE C-ARM 42X72 X-RAY (DRAPES) IMPLANT
DRAPE LAPAROTOMY 100X72 PEDS (DRAPES) IMPLANT
DRAPE LAPAROTOMY 100X72X124 (DRAPES) IMPLANT
DRSG OPSITE POSTOP 3X4 (GAUZE/BANDAGES/DRESSINGS) IMPLANT
DURAPREP 26ML APPLICATOR (WOUND CARE) ×2 IMPLANT
ELECT BLADE 4.0 EZ CLEAN MEGAD (MISCELLANEOUS) ×2
ELECT REM PT RETURN 9FT ADLT (ELECTROSURGICAL) ×2
ELECTRODE BLDE 4.0 EZ CLN MEGD (MISCELLANEOUS) ×2 IMPLANT
ELECTRODE REM PT RTRN 9FT ADLT (ELECTROSURGICAL) ×2 IMPLANT
GAUZE 4X4 16PLY ~~LOC~~+RFID DBL (SPONGE) IMPLANT
GAUZE SPONGE 4X4 12PLY STRL (GAUZE/BANDAGES/DRESSINGS) ×2 IMPLANT
GLOVE BIOGEL PI IND STRL 8.5 (GLOVE) ×2 IMPLANT
GLOVE ECLIPSE 8.5 STRL (GLOVE) ×2 IMPLANT
GOWN STRL REUS W/ TWL LRG LVL3 (GOWN DISPOSABLE) ×2 IMPLANT
GOWN STRL REUS W/ TWL XL LVL3 (GOWN DISPOSABLE) ×2 IMPLANT
GOWN STRL REUS W/TWL 2XL LVL3 (GOWN DISPOSABLE) ×2 IMPLANT
GOWN STRL REUS W/TWL LRG LVL3 (GOWN DISPOSABLE) ×2
GOWN STRL REUS W/TWL XL LVL3 (GOWN DISPOSABLE) ×2
KIT BASIN OR (CUSTOM PROCEDURE TRAY) ×2 IMPLANT
KIT TURNOVER KIT B (KITS) ×2 IMPLANT
MARKER SKIN DUAL TIP RULER LAB (MISCELLANEOUS) ×2 IMPLANT
NDL HYPO 22X1.5 SAFETY MO (MISCELLANEOUS) ×2 IMPLANT
NEEDLE HYPO 22X1.5 SAFETY MO (MISCELLANEOUS) ×2 IMPLANT
NS IRRIG 1000ML POUR BTL (IV SOLUTION) ×2 IMPLANT
PACK LAMINECTOMY NEURO (CUSTOM PROCEDURE TRAY) ×2 IMPLANT
PAD ARMBOARD 7.5X6 YLW CONV (MISCELLANEOUS) ×2 IMPLANT
RASP 3.0MM (RASP) IMPLANT
SPIKE FLUID TRANSFER (MISCELLANEOUS) ×2 IMPLANT
SPONGE SURGIFOAM ABS GEL SZ50 (HEMOSTASIS) ×2 IMPLANT
SPONGE T-LAP 4X18 ~~LOC~~+RFID (SPONGE) IMPLANT
STAPLER SKIN PROX WIDE 3.9 (STAPLE) IMPLANT
STRIP CLOSURE SKIN 1/2X4 (GAUZE/BANDAGES/DRESSINGS) IMPLANT
SUT VIC AB 0 CT1 18XCR BRD8 (SUTURE) ×2 IMPLANT
SUT VIC AB 0 CT1 8-18 (SUTURE) ×2
SUT VIC AB 2-0 CP2 18 (SUTURE) ×2 IMPLANT
SUT VIC AB 3-0 SH 8-18 (SUTURE) ×2 IMPLANT
SUT VIC AB 4-0 RB1 18 (SUTURE) ×2 IMPLANT
SWAB COLLECTION DEVICE MRSA (MISCELLANEOUS) IMPLANT
SWAB CULTURE ESWAB REG 1ML (MISCELLANEOUS) IMPLANT
TAPE CLOTH SURG 4X10 WHT LF (GAUZE/BANDAGES/DRESSINGS) IMPLANT
TOWEL GREEN STERILE (TOWEL DISPOSABLE) ×2 IMPLANT
TOWEL GREEN STERILE FF (TOWEL DISPOSABLE) ×2 IMPLANT
WATER STERILE IRR 1000ML POUR (IV SOLUTION) ×2 IMPLANT

## 2023-01-22 NOTE — Transfer of Care (Signed)
Immediate Anesthesia Transfer of Care Note  Patient: Justin [name]  Procedure(s) Performed: REMOVAL OF RIGHT LUMBAR FIVE-SACRAL ONE HARDWARE (Right: Spine Lumbar) RESECTION OF LIPOMA (Left: Shoulder)  Patient Location: PACU  Anesthesia Type:General  Level of Consciousness: awake  Airway & Oxygen Therapy: Patient Spontanous Breathing and Patient connected to nasal cannula oxygen  Post-op Assessment: Report given to RN  Post vital signs: stable  Last Vitals:  Vitals Value Taken Time  BP 131/93 01/22/23 1517  Temp    Pulse 104 01/22/23 1522  Resp 14 01/22/23 1522  SpO2 100 % 01/22/23 1522  Vitals shown include unfiled device data.  Last Pain:  Vitals:   01/22/23 1024  TempSrc:   PainSc: 8          Complications: No notable events documented.

## 2023-01-22 NOTE — H&P (Signed)
Justin [name] is an 57 y.o. male.   Chief Complaint: Chronic right lumbar radiculopathy  HPI: Patient is a 57 year old male who has had previous decompression fusion at L5-S1.  Since the time of his surgery is complaining of right lumbar radiculopathy in a L5 distribution.  Careful review of his films demonstrates that the L5 screw may have some medial cut out which may irritate the L5 nerve root.  After careful consideration of his options I advised surgical removal of the hardware on the right side.  He is now admitted for that procedure.  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  He has had chronic complaints of neck and shoulder pain for a long period of time and he desires to have the lipoma removed.  He is now also scheduled to have the lipoma resected at the same time as pedicle hardware is being removed.  Past Medical History:  Diagnosis Date   Achilles rupture, left    Acute deep vein thrombosis (DVT) of right peroneal vein (HCC) 10/19/2021   Bipolar 1 disorder (HCC)    CHF (congestive heart failure) (HCC)    Dental caries    periodontitis   Depression    Hypertension    Pneumonia    Pulmonary embolism (HCC) 05/18/2007   SDH (subdural hematoma) (HCC) 01/04/2013   Sleep apnea    wears CPAP   Subdural hematoma (HCC) 01/04/2013   in setting of supratherapeutic INR   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    Past Surgical History:  Procedure Laterality Date   ACHILLES TENDON SURGERY Left 10/21/2014   Procedure: Left Achilles Reconstruction;  Surgeon: Nadara Mustard, MD;  Location: [facility] OR;  Service: Orthopedics;  Laterality: Left;   APPENDECTOMY     CARDIAC CATHETERIZATION  03/04/2018   [facility]: Normal coronaries, LVEF estimated at 40%, medical Rx   CRANIOTOMY N/A 01/19/2013   Procedure: CRANIOTOMY HEMATOMA EVACUATION SUBDURAL;  Surgeon: Hewitt Shorts, MD;  Location: [facility] NEURO ORS;  Service:  Neurosurgery;  Laterality: N/A;   CYSTECTOMY     right head   ELBOW SURGERY     right   FRACTURE SURGERY Right    finger- 1st digit right hand   I & D EXTREMITY Left 12/07/2016   Procedure: LEFT ACHILLES DEBRIDEMENT;  Surgeon: Nadara Mustard, MD;  Location: [facility] OR;  Service: Orthopedics;  Laterality: Left;   LUMBAR FUSION  12/23/2017   L5 GILL PROCEDURE, RIGHT L5-S1, TRANSFORAMIAL LUMBAR INTERBODY FUSION, BILATERAL LATERAL FUSION, PEDICLE INSTRUMENTATION   MULTIPLE EXTRACTIONS WITH ALVEOLOPLASTY N/A 03/07/2015   Procedure: MULTIPLE EXTRACTION WITH ALVEOLOPLASTY;  Surgeon: [name], DDS;  Location: [facility] OR;  Service: Oral Surgery;  Laterality: N/A;   RIGHT/LEFT HEART CATH AND CORONARY ANGIOGRAPHY N/A 05/18/2022   Procedure: RIGHT/LEFT HEART CATH AND CORONARY ANGIOGRAPHY;  Surgeon: Laurey Morale, MD;  Location: [facility] INVASIVE CV LAB;  Service: Cardiovascular;  Laterality: N/A;   SPINAL CORD STIMULATOR INSERTION N/A 05/18/2019   Procedure: LUMBAR SPINAL CORD STIMULATOR INSERTION;  Surgeon: Odette Fraction, MD;  Location: [facility] OR;  Service: Neurosurgery;  Laterality: N/A;  Thoracic/Lumbar   SPINAL CORD STIMULATOR INSERTION N/A 04/06/2020   Procedure: Revision of spinal cord stimulator;  Surgeon: Renaldo Fiddler, MD;  Location: [facility] OR;  Service: Neurosurgery;  Laterality: N/A;   SPINAL CORD STIMULATOR REMOVAL N/A 10/30/2022   Procedure: SCS REVISION OF GENERATOR;  Surgeon: Barnett Abu, MD;  Location: [facility] OR;  Service: Neurosurgery;  Laterality: N/A;  3C   TEE WITHOUT CARDIOVERSION N/A 05/18/2022   Procedure: TRANSESOPHAGEAL ECHOCARDIOGRAM (TEE);  Surgeon: Laurey Morale, MD;  Location: [facility] ENDOSCOPY;  Service: Cardiovascular;  Laterality: N/A;    Family History  Problem Relation Age of Onset   Hypertension Mother    Stroke Mother    Multiple sclerosis Sister    Down syndrome Son    Social History:  reports that he has never smoked. He has been exposed to tobacco smoke. He has never used  smokeless tobacco. He reports that he does not drink alcohol and does not use drugs.  Allergies:  Allergies  Allergen Reactions   Lyrica [Pregabalin] Other (See Comments)    Hallucinations    Medications Prior to Admission  Medication Sig Dispense Refill   carvedilol (COREG) 12.5 MG tablet Take 1 tablet (12.5 mg total) by mouth 2 (two) times daily. 60 tablet 11   dabigatran (PRADAXA) 150 MG CAPS capsule TAKE 1 CAPSULE(150 MG) BY MOUTH TWICE DAILY 180 capsule 3   doxepin (SINEQUAN) 75 MG capsule Take 75 mg by mouth at bedtime.     empagliflozin (JARDIANCE) 10 MG TABS tablet Take 1 tablet (10 mg total) by mouth daily before breakfast. 30 tablet 11   furosemide (LASIX) 40 MG tablet Take 1 tablet (40 mg total) by mouth as needed for fluid or edema (take if a weight gain of 3 lbs within 24 hours or 5 lbs in a week.). 90 tablet 3   mirtazapine (REMERON) 45 MG tablet Take 45 mg by mouth at bedtime.     pravastatin (PRAVACHOL) 10 MG tablet Take 10 mg by mouth at bedtime.     QUEtiapine (SEROQUEL XR) 400 MG 24 hr tablet Take 800 mg by mouth at bedtime.     sacubitril-valsartan (ENTRESTO) 97-103 MG Take 1 tablet by mouth 2 (two) times daily. 60 tablet 11   spironolactone (ALDACTONE) 25 MG tablet Take 1 tablet (25 mg total) by mouth daily. 30 tablet 11   tiZANidine (ZANAFLEX) 4 MG tablet Take 4 mg by mouth 2 (two) times daily as needed for muscle spasms.     potassium chloride SA (KLOR-CON M) 20 MEQ tablet TAKE 2 TABLETS(40 MEQ) BY MOUTH TWICE DAILY FOR 1 DAY THEN TAKE 2 TABLETS(40 MEQ) BY MOUTH DAILY 64 tablet 0    No results found for this or any previous visit (from the past 48 hour(s)). No results found.  Review of Systems  Musculoskeletal:  Positive for back pain and neck pain.  All other systems reviewed and are negative.   Blood pressure (!) 143/95, pulse 75, temperature 98.7 F (37.1 C), temperature source Oral, resp. rate 18, height 5\' 10"  (1.778 m), weight 97.5 kg, SpO2  100%. Physical Exam Constitutional:      Appearance: Normal appearance.  HENT:     Head: Normocephalic and atraumatic.     Right Ear: Tympanic membrane, ear canal and external ear normal.     Left Ear: Tympanic membrane, ear canal and external ear normal.     Nose: Nose normal.     Mouth/Throat:     Mouth: Mucous membranes are moist.  Eyes:     Extraocular Movements: Extraocular movements intact.     Conjunctiva/sclera: Conjunctivae normal.     Pupils: Pupils are equal, round, and reactive to light.  Cardiovascular:     Rate and Rhythm: Normal rate.  Pulmonary:     Effort: Pulmonary effort is normal.     Breath  sounds: Normal breath sounds.  Abdominal:     General: Abdomen is flat. Bowel sounds are normal.     Palpations: Abdomen is soft.  Musculoskeletal:        General: Normal range of motion.     Cervical back: Normal range of motion and neck supple.  Skin:    General: Skin is warm and dry.     Capillary Refill: Capillary refill takes less than 2 seconds.  Neurological:     General: No focal deficit present.     Mental Status: He is alert.  Psychiatric:        Mood and Affect: Mood normal.        Behavior: Behavior normal.        Thought Content: Thought content normal.        Judgment: Judgment normal.      Assessment/Plan Painful orthopedic hardware L5-S1.  Lipoma left shoulder.  Plan: Removal of hardware from L5-S1.  Resection of lipoma from left shoulder.  Stefani Dama, MD 01/22/2023, 12:35 PM

## 2023-01-22 NOTE — Op Note (Signed)
Date of surgery: 01/22/2023 Preoperative diagnosis: #1.  Painful orthopedic hardware right L5-S1. 2.  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 Postoperative diagnosis: Same Procedure:1.  Removal of L5 and S1 pedicle screws on the right side of the spine using Metrix retractor and fluoroscopic guidance. 2.  Resection of lipoma and sebaceous cyst from left shoulder region dimensions 10 x 10 x 4 cm. Surgeon: Barnett Abu Anesthesia: General endotracheal Indications: The patient is a 57 year old individual whose had significant pain with a right lumbar radiculopathy.  Years ago he underwent posterior decompression and fusion at L5-S1 follow-up of his hardware demonstrates that his right L5 screw appears to be slightly displaced inferiorly into the foramen.  I have advised that removal of the screw may alleviate some of his painful symptoms.  The patient also noted that he had a large lipoma that was getting bigger on examination the lipomas associated with a sebaceous cyst and it measures 10 x 10 by about 4 cm in depth.  He desired to have this removed and he is consented for this procedure also.  Procedure: The patient was brought to the operating room supine on the stretcher.  After the smooth induction of general endotracheal anesthesia he was carefully placed prone on the operating table.  The bony prominences were appropriately padded and protected.  Then the area of the lipoma was carefully traced off to the center on the left side of his shoulder.  It was contiguous with a sebaceous cyst that protruded to the skin.  An elliptical incision was made around the sebaceous cyst and the incision was carried cephalad to encompass the entire length of the lipoma.  The area was then cleansed with alcohol and DuraPrep and draped in a sterile fashion as was the region of the lower lumbar spine over the region of the right posterior suprailiac crest.  After  an appropriate timeout was performed fluoroscopic guidance was used to localize the screws on the right side at L5-S1.  The area was infiltrated with about 5 cc of lidocaine with epinephrine mixed 50-50 with half percent Marcaine and the dissection was carried down in the chosen area and a K wire was then passed to palpate the area of the hardware.  The K wire was centered in the top of the screw and then a series of dilators was passed over this to the 18 mm diameter.  A 6 cm tube with 18 mm of opening was then placed over this and the internal tubes are removed.  Through this aperture then I could see the fascia overlying top screw of L5-S1 on the right side.  This was cleared with the monopolar cautery and suction.  The Of the screw was identified and this was removed.  The the retractor system was then moved to the lower screw using cautery to dissect the tissue free.  The lower Was removed.  Then by working along the rod the entirety of the rod was exposed and the rod was able to be removed by lifting it.  It was brought out through the tube.  The tube was then placed over each individual screw and in the depth of the soft tissue inside the screw was cleared and the screw was removed this was first done at L5 and S1.  The tube was then removed.  Hemostasis was obtained easily.  The wound was closed with #1 Vicryl in the depth for a single  stitch and 3-0 Vicryl was used in the subcuticular with some 4-0 Vicryl in a subcuticular closure also.  An additional 10 cc of half percent Marcaine was injected into the depth for good anesthetic effect.  Next the area of the lipoma in the shoulder region was addressed with about 3 cc of lidocaine being injected into this region this was the mixture with half percent Marcaine.  Then the elliptical incision was made around the sebaceous cyst and extended cephalad.  It was taken down through the subcutaneous tissue into the subcutaneous fat.  Then by dissecting in the  subcutaneous fat the area of the sebaceous cyst could be palpated and this was carefully dissected without rupturing into it.  This became contiguous with the lipoma and this was further dissected laterally superiorly and inferiorly.  Once the entirety of the lipoma was excised it was sent for pathologic examination.  Hemostasis in the surrounding tissues was obtained with the bipolar and monopolar cautery.  Care was taken to make sure no lipomatous tissue remained in the wound and when verified then the deep layer was closed with #1 and 2-0 Vicryl.  The subcutaneous layer was closed with 3-0 Vicryl and 4-0 Vicryl was used in subcuticular closure.  A pressure dressing was applied to this region.  Patient tolerated procedure well blood loss is estimated less than 15 cc and the patient was returned to recovery room stable condition.

## 2023-01-22 NOTE — Discharge Summary (Signed)
Date of admission: 01/22/2023 Date of discharge: 01/22/2023 Admitting diagnosis: Painful orthopedic hardware lumbar spine, lipoma interscapular region measuring 10 x 10 x 4 cm Discharge in final diagnosis: Painful orthopedic hardware lumbar spine.  Lipoma interscapular region measuring 10 x 10 x 4 cm. Procedures performed: Removal of pedicle screw fixation L5-S1 on the right using Metrix retractor fluoroscopic guidance.  Resection of lipoma and sebaceous cyst from interscapular region. Condition on discharge: Improved Follow-up: Patient will be seen in outpatient clinic in 2 weeks time.

## 2023-01-22 NOTE — Discharge Instructions (Addendum)
Wound Care Keep incision covered and dry until post op day 3. You may remove the Honeycomb dressing on post op day 3. Leave steri-strips on back.  They will fall off by themselves. Do not put any creams, lotions, or ointments on incision. You are fine to shower. Let water run over incision and pat dry.   Activity Walk each and every day, increasing distance each day. No lifting greater than 8 lbs.  No lifting no bending no twisting    Diet Resume your normal diet.   Return to Work Will be discussed at your follow up appointment.  Call Your Doctor If Any of These Occur Redness, drainage, or swelling at the wound.  Temperature greater than 101 degrees. Severe pain not relieved by pain medication. Incision starts to come apart.  Follow Up Appt Call [phone] if you have one or any problem.

## 2023-01-22 NOTE — Anesthesia Procedure Notes (Addendum)
Procedure Name: Intubation Date/Time: 01/22/2023 1:18 PM  Performed by: Einar Grad, CRNAPre-anesthesia Checklist: Patient identified, Emergency Drugs available, Suction available, Patient being monitored and Timeout performed Patient Re-evaluated:Patient Re-evaluated prior to induction Oxygen Delivery Method: Circle system utilized Preoxygenation: Pre-oxygenation with 100% oxygen Induction Type: IV induction Ventilation: Mask ventilation without difficulty Laryngoscope Size: Mac and 4 Grade View: Grade I Tube type: Oral Tube size: 8.0 mm Number of attempts: 1 Airway Equipment and Method: Stylet Placement Confirmation: ETT inserted through vocal cords under direct vision, positive ETCO2, CO2 detector and breath sounds checked- equal and bilateral Secured at: 23 cm Tube secured with: Tape Dental Injury: Teeth and Oropharynx as per pre-operative assessment  Comments: Upper denture plate removed prior to intubation, given to nurse

## 2023-01-22 NOTE — Progress Notes (Signed)
Patient alert and oriented, mae's well, voiding adequate amount of urine, swallowing without difficulty, no c/o pain at time of discharge. Patient discharged home with family. Script and discharged instructions given to patient. Patient and family stated understanding of instructions given. Patient has an appointment with Dr. Elsner  

## 2023-01-23 ENCOUNTER — Encounter (HOSPITAL_COMMUNITY): Payer: Self-pay | Admitting: Neurological Surgery

## 2023-01-23 NOTE — Anesthesia Postprocedure Evaluation (Signed)
Anesthesia Post Note  Patient: Justin [name]  Procedure(s) Performed: REMOVAL OF RIGHT LUMBAR FIVE-SACRAL ONE HARDWARE (Right: Spine Lumbar) RESECTION OF LIPOMA (Left: Shoulder)     Patient location during evaluation: PACU Anesthesia Type: General Level of consciousness: awake and alert Pain management: pain level controlled Vital Signs Assessment: post-procedure vital signs reviewed and stable Respiratory status: spontaneous breathing, nonlabored ventilation and respiratory function stable Cardiovascular status: blood pressure returned to baseline and stable Postop Assessment: no apparent nausea or vomiting Anesthetic complications: no   No notable events documented.  Last Vitals:  Vitals:   01/22/23 1600 01/22/23 1629  BP: 118/85 (!) 130/94  Pulse: 92 84  Resp: 15 18  Temp:  36.7 C  SpO2: 99% 97%                   Justin [name]

## 2023-01-24 ENCOUNTER — Encounter (HOSPITAL_COMMUNITY): Payer: Self-pay

## 2023-01-24 LAB — SURGICAL PATHOLOGY

## 2023-01-25 ENCOUNTER — Telehealth (HOSPITAL_COMMUNITY): Payer: Self-pay | Admitting: *Deleted

## 2023-01-25 NOTE — Telephone Encounter (Signed)
Reaching out to patient to offer assistance regarding upcoming cardiac imaging study; pt verbalizes understanding of appt date/time, parking situation and where to check in, and verified current allergies; name and call back number provided for further questions should they arise  Larey Brick RN Navigator Cardiac Imaging [facility] [phone] office [phone] cell  Patient states he's had an MRI in the past without incident. He reports a spinal cord stimulator and is aware to bring his remote fully charged to appt.

## 2023-01-28 ENCOUNTER — Ambulatory Visit (HOSPITAL_COMMUNITY)
Admission: RE | Admit: 2023-01-28 | Discharge: 2023-01-28 | Disposition: A | Discharge status: Home / Self Care | Payer: MEDICAID | Source: Ambulatory Visit | Attending: Cardiology | Admitting: Cardiology

## 2023-01-28 ENCOUNTER — Encounter (HOSPITAL_COMMUNITY): Payer: Self-pay

## 2023-01-28 DIAGNOSIS — I5022 Chronic systolic (congestive) heart failure: Secondary | ICD-10-CM

## 2023-01-30 ENCOUNTER — Other Ambulatory Visit (HOSPITAL_COMMUNITY): Payer: Self-pay | Admitting: Cardiology

## 2023-01-30 ENCOUNTER — Ambulatory Visit (HOSPITAL_COMMUNITY)
Admission: RE | Admit: 2023-01-30 | Discharge: 2023-01-30 | Disposition: A | Discharge status: Home / Self Care | Payer: 59 | Source: Ambulatory Visit | Attending: Cardiology | Admitting: Cardiology

## 2023-01-30 DIAGNOSIS — I5022 Chronic systolic (congestive) heart failure: Secondary | ICD-10-CM

## 2023-01-30 MED ORDER — GADOBUTROL 1 MMOL/ML IV SOLN
10.0000 mL | Freq: Once | INTRAVENOUS | Status: AC | PRN
Start: 2023-01-30 — End: 2023-01-30
  Administered 2023-01-30: 10 mL via INTRAVENOUS

## 2023-01-31 ENCOUNTER — Other Ambulatory Visit (HOSPITAL_COMMUNITY): Payer: Self-pay

## 2023-01-31 NOTE — Progress Notes (Signed)
Paramedicine Encounter    Patient ID: Justin [name], male    DOB: [birth year], 57 y.o.   MRN: [id number]   Complaints-BACK SENSITIVE FROM SURGERY   Edema-none   Compliance with meds-reports compliance but ???  Pill box filled-YES it was half way filled already and I just filled it back up  If so, by whom-paramedic   Refills needed-mirtazapine- has it for thurs, fri and sat   Pt reports he is doing ok. He had a nice time out of town, came back for his back surgery. Healing well. He reports still sensitive when getting in and out of vehicle.    His weight is up  from past few wks. Appetite good.  He had MRI done yesterday.  He does report some concern that when he wears his glasses on top of his head then it leaves an indention almost pitting like to his head.  Questionable med compliance-discrepency with pill dates on bottles and refills needed.   He has clinic appoint next week, will f/u at that appoint.   BP (!) 110/56   Pulse 90   Resp 16   Wt 219 lb (99.3 kg)   SpO2 98%   BMI 31.42 kg/m  Weight yesterday-? Last visit weight-212  Patient Care Team: [facility], [facility] as PCP - General (Internal Medicine) Christell Constant, MD as PCP - Cardiology (Cardiology)  Patient Active Problem List   Diagnosis Date Noted   Painful orthopaedic hardware [facility]) 01/22/2023   Pain in right leg 08/06/2022   Fatigue 07/31/2022   History of DVT (deep vein thrombosis) 07/17/2022   Tremors of nervous system 07/17/2022   Nonischemic cardiomyopathy (HCC) 05/18/2022   Chronic systolic CHF (congestive heart failure) (HCC) 05/18/2022   Snoring 04/04/2022   Normocytic anemia 12/22/2021   History of pulmonary embolism 10/19/2021   Status post lumbar spinal fusion 01/03/2018   Lumbar stenosis 12/23/2017   Spondylolisthesis, lumbar region 06/25/2017   Achilles tendinitis of left lower extremity 12/07/2016   Achilles tendinitis, left leg 05/17/2016   Obstructive sleep apnea on CPAP  03/07/2015   Achilles rupture, left 10/21/2014   Bipolar disorder, unspecified (HCC) 01/07/2013   Headache 01/07/2013   Chronic anticoagulation 01/04/2013    Current Outpatient Medications:    carvedilol (COREG) 12.5 MG tablet, Take 1 tablet (12.5 mg total) by mouth 2 (two) times daily., Disp: 60 tablet, Rfl: 11   dabigatran (PRADAXA) 150 MG CAPS capsule, TAKE 1 CAPSULE(150 MG) BY MOUTH TWICE DAILY, Disp: 180 capsule, Rfl: 3   doxepin (SINEQUAN) 75 MG capsule, Take 75 mg by mouth at bedtime., Disp: , Rfl:    empagliflozin (JARDIANCE) 10 MG TABS tablet, Take 1 tablet (10 mg total) by mouth daily before breakfast., Disp: 30 tablet, Rfl: 11   furosemide (LASIX) 40 MG tablet, Take 1 tablet (40 mg total) by mouth as needed for fluid or edema (take if a weight gain of 3 lbs within 24 hours or 5 lbs in a week.)., Disp: 90 tablet, Rfl: 3   mirtazapine (REMERON) 45 MG tablet, Take 45 mg by mouth at bedtime., Disp: , Rfl:    potassium chloride SA (KLOR-CON M) 20 MEQ tablet, TAKE 2 TABLETS(40 MEQ) BY MOUTH TWICE DAILY FOR 1 DAY THEN TAKE 2 TABLETS(40 MEQ) BY MOUTH DAILY, Disp: 64 tablet, Rfl: 0   pravastatin (PRAVACHOL) 10 MG tablet, Take 10 mg by mouth at bedtime., Disp: , Rfl:    QUEtiapine (SEROQUEL XR) 400 MG 24 hr tablet, Take 800 mg by  mouth at bedtime., Disp: , Rfl:    sacubitril-valsartan (ENTRESTO) 97-103 MG, Take 1 tablet by mouth 2 (two) times daily., Disp: 60 tablet, Rfl: 11   spironolactone (ALDACTONE) 25 MG tablet, Take 1 tablet (25 mg total) by mouth daily., Disp: 30 tablet, Rfl: 11   tiZANidine (ZANAFLEX) 4 MG tablet, Take 4 mg by mouth 2 (two) times daily as needed for muscle spasms., Disp: , Rfl:    HYDROcodone-acetaminophen (NORCO/VICODIN) 5-325 MG tablet, Take 1 tablet by mouth every 6 (six) hours as needed for moderate pain (pain score 4-6)., Disp: 10 tablet, Rfl: 0 Allergies  Allergen Reactions   Lyrica [Pregabalin] Other (See Comments)    Hallucinations      Social History    Socioeconomic History   Marital status: Significant Other    Spouse name: Not on file   Number of children: 4   Years of education: Not on file   Highest education level: Not on file  Occupational History   Occupation: disability  Tobacco Use   Smoking status: Never    Passive exposure: Past   Smokeless tobacco: Never  Vaping Use   Vaping status: Never Used  Substance and Sexual Activity   Alcohol use: No   Drug use: No   Sexual activity: Not on file  Other Topics Concern   Not on file  Social History Narrative   FROM [place]    Lives in a 2 story apartment.  His cousin is currently staying with him.  Has 4 children.  On disability for the past 10 years for pulmonary embolism, bipolar disease.  Education: high school.   Social Determinants of Health   Financial Resource Strain: Not on file  Food Insecurity: No Food Insecurity (07/17/2022)   Hunger Vital Sign    Worried About Running Out of Food in the Last Year: Never true    Ran Out of Food in the Last Year: Never true  Transportation Needs: No Transportation Needs (07/17/2022)   PRAPARE - Administrator, Civil Service (Medical): No    Lack of Transportation (Non-Medical): No  Physical Activity: Not on file  Stress: Not on file  Social Connections: Unknown (07/31/2021)   Received from [facility], [facility]   Social Network    Social Network: Not on file  Intimate Partner Violence: Unknown (07/17/2022)   Humiliation, Afraid, Rape, and Kick questionnaire    Fear of Current or Ex-Partner: No    Emotionally Abused: No    Physically Abused: Not on file    Sexually Abused: Not on file    Physical Exam      Future Appointments  Date Time Provider Department Center  02/06/2023 11:40 AM Laurey Morale, MD [facility] None  04/25/2023  2:30 PM [facility] None  04/25/2023  3:00 PM Jaci Standard, MD [facility] None       Kerry Hough, Paramedic [phone] [facility] Paramedic  01/31/23

## 2023-02-06 ENCOUNTER — Encounter (HOSPITAL_COMMUNITY): Payer: Self-pay | Admitting: Cardiology

## 2023-02-06 ENCOUNTER — Ambulatory Visit (HOSPITAL_COMMUNITY)
Admission: RE | Admit: 2023-02-06 | Discharge: 2023-02-06 | Disposition: A | Discharge status: Home / Self Care | Payer: 59 | Source: Ambulatory Visit | Attending: Cardiology | Admitting: Cardiology

## 2023-02-06 ENCOUNTER — Other Ambulatory Visit (HOSPITAL_COMMUNITY): Payer: Self-pay

## 2023-02-06 VITALS — BP 110/70 | HR 85 | Wt 221.4 lb

## 2023-02-06 DIAGNOSIS — R9431 Abnormal electrocardiogram [ECG] [EKG]: Secondary | ICD-10-CM | POA: Diagnosis not present

## 2023-02-06 DIAGNOSIS — I130 Hypertensive heart and chronic kidney disease with heart failure and stage 1 through stage 4 chronic kidney disease, or unspecified chronic kidney disease: Secondary | ICD-10-CM | POA: Insufficient documentation

## 2023-02-06 DIAGNOSIS — I340 Nonrheumatic mitral (valve) insufficiency: Secondary | ICD-10-CM | POA: Insufficient documentation

## 2023-02-06 DIAGNOSIS — I5022 Chronic systolic (congestive) heart failure: Secondary | ICD-10-CM | POA: Diagnosis not present

## 2023-02-06 DIAGNOSIS — Z83718 Other family history of colon polyps: Secondary | ICD-10-CM | POA: Insufficient documentation

## 2023-02-06 DIAGNOSIS — G4733 Obstructive sleep apnea (adult) (pediatric): Secondary | ICD-10-CM | POA: Diagnosis not present

## 2023-02-06 DIAGNOSIS — I428 Other cardiomyopathies: Secondary | ICD-10-CM | POA: Diagnosis not present

## 2023-02-06 DIAGNOSIS — N183 Chronic kidney disease, stage 3 unspecified: Secondary | ICD-10-CM | POA: Diagnosis not present

## 2023-02-06 DIAGNOSIS — F319 Bipolar disorder, unspecified: Secondary | ICD-10-CM | POA: Diagnosis not present

## 2023-02-06 DIAGNOSIS — Z7901 Long term (current) use of anticoagulants: Secondary | ICD-10-CM | POA: Diagnosis not present

## 2023-02-06 DIAGNOSIS — Z86711 Personal history of pulmonary embolism: Secondary | ICD-10-CM | POA: Diagnosis not present

## 2023-02-06 DIAGNOSIS — Z83711 Family history of hyperplastic colon polyps: Secondary | ICD-10-CM | POA: Diagnosis not present

## 2023-02-06 LAB — BASIC METABOLIC PANEL
Anion gap: 6 (ref 5–15)
BUN: 8 mg/dL (ref 6–20)
CO2: 25 mmol/L (ref 22–32)
Calcium: 9 mg/dL (ref 8.9–10.3)
Chloride: 108 mmol/L (ref 98–111)
Creatinine, Ser: 1.36 mg/dL — ABNORMAL HIGH (ref 0.61–1.24)
GFR, Estimated: 60 mL/min — ABNORMAL LOW (ref >60)
Glucose, Bld: 126 mg/dL — ABNORMAL HIGH (ref 70–99)
Potassium: 4.6 mmol/L (ref 3.5–5.1)
Sodium: 139 mmol/L (ref 135–145)

## 2023-02-06 LAB — BRAIN NATRIURETIC PEPTIDE: B Natriuretic Peptide: 57.8 pg/mL (ref 0.0–100.0)

## 2023-02-06 MED ORDER — CARVEDILOL 12.5 MG PO TABS: 18.7500 mg | ORAL_TABLET | Freq: Two times a day (BID) | ORAL | 3 refills | Status: DC

## 2023-02-06 NOTE — Patient Instructions (Signed)
INCREASE  Carvedilol to 18.75 mg ( 1 1/2 Tab) Twice daily  Labs done today, your results will be available in MyChart, we will contact you for abnormal readings.  You have been referred the electrophysiologist. They will call you to arrange your appointment.  Your physician recommends that you schedule a follow-up appointment in: 2 months.  If you have any questions or concerns before your next appointment please send [place] a message through [place] or call our office at [phone].    TO LEAVE A MESSAGE FOR THE NURSE SELECT OPTION 2, PLEASE LEAVE A MESSAGE INCLUDING: YOUR NAME DATE OF BIRTH CALL BACK NUMBER REASON FOR CALL**this is important as we prioritize the call backs  YOU WILL RECEIVE A CALL BACK THE SAME DAY AS LONG AS YOU CALL BEFORE 4:00 PM  At the [facility], you and your health needs are our priority. As part of our continuing mission to provide you with exceptional heart care, we have created designated Provider Care Teams. These Care Teams include your primary Cardiologist (physician) and Advanced Practice Providers (APPs- Physician Assistants and Nurse Practitioners) who all work together to provide you with the care you need, when you need it.   You may see any of the following providers on your designated Care Team at your next follow up: Dr Arvilla Meres Dr Marca Ancona Dr. Dorthula Nettles Dr. Clearnce Hasten Amy Filbert Schilder, NP Robbie Lis, [facility] [place], [place] Brynda Peon, NP [place] Lee, NP Karle Plumber, PharmD   Please be sure to bring in all your medications bottles to every appointment.    Thank you for choosing [facility]

## 2023-02-06 NOTE — Progress Notes (Signed)
PCP: [facility], [facility] Cardiology: Dr. Katrinka Blazing HF Cardiology: Dr. Shirlee Latch  Justin [name] is a 57 y.o. with history of PE/DVT, bipolar disorder, and chronic systolic CHF was referred by Dr. Katrinka Blazing for evaluation of CHF. Patient has been on anticoagulation long-term for his h/o PE/DVT.  He does not miss doses.  Suspected clotting disorder, being worked up by hematology for possible antiphospholipid antibody syndrome.  He had initial PE in 2009, Eliquis started.  He had DVT on left while on Eliquis and switched to coumadin. In 9/23, he had a spontaneous left thigh hematoma while INR supratherapeutic.  Warfarin was held and eventually switched to Pradaxa   Due to progressive dyspnea, echo was done in 1/24.  This showed EF 20-25%, severe LV dilation, normal RV, moderate-severe Justin. Patient had had coronary CTA in 12/23 with calcium score 49 and mild disease noted in LAD.    TEE in 3/24 showed EF 25-30%, moderate LV dilation, mildly decreased RV function, only mild Justin.  LHC/RHC in 3/24 showed mild nonobstructive CAD, normal filling pressures and preserved cardiac output.   Admitted 07/18/22 with tremors. Saw Pyschiatry. Lithium was stopped.   Echo in 9/24 showed EF 30-35% with global hypokinesis, normal RV, no significant Justin.    Cardiac MRI in 11/24 showed LV EF 30% with mild LV dilation, RV EF 45%, subtle mid-wall LGE in the septum (nonspecific, seen with dilated cardiomyopathy), ECV 33%.   Today he returns for HF follow up. He had recent back surgery and is recovering from this.  He has generalized fatigue but no dyspnea walking on flat ground.  He is using CPAP.  Weight is up, he has not been very active since his back surgery.  No orthopnea/PND.  No lightheadedness. No chest pain. Cares for son with Down Syndrome.   ECG (personally reviewed): NSR, anterolateral TWIs  Labs (12/23): Pro-BNP 547 Labs (2/24): K 4, creatinine 1.16, BNP 89 Labs (4/24): K 4, creatinine 1.38  Labs (8/24): K 4.2, creatinine  1.72 Labs (10/24): K 3.7, creatinine 1.21  PMH: 1. HTN 2. OSA: Uses CPAP.   3. H/o DVT and PE: PE 2009 with Eliquis started, DVT on left in 7/23 while on Eliquis, switched to warfarin.  Had large left thigh intramuscular hematoma with supratherapeutic INR.  Now on Pradaxa.  - Venous dopplers in 2/24 showed no DVT 4. Bipolar disorder: previously on Li.  5. H/o subdural hematoma 10/14 6. Chronic systolic CHF: Echo (1/24) with EF 20-25%, severe LV dilation, normal RV, moderate-severe Justin.  - Coronary CTA (12/23): Calcium score 49, mild stenosis in LAD.  - TEE (3/24): EF 25-30%, moderate LV dilation, mildly decreased RV function, only mild Justin. - LHC/RHC (3/24): Mild nonobstructive CAD, mean RA 2, PAP 35/17, mean PCWP 12, CI 2.69 - Echo (9/24): EF 30-35% with global hypokinesis, normal RV, no significant Justin.   - Cardiac MRI (11/24): LV EF 30% with mild LV dilation, RV EF 45%, subtle mid-wall LGE in the septum (nonspecific, seen with dilated cardiomyopathy), ECV 33%. 7. Spontaneous large left thigh intramuscular hematoma in setting of supratherapeutic INR on warfarin.  8. ABIs (2/24): normal 9. CKD stage 3  FH: No cardiac problems that he knows of.   Social History   Socioeconomic History   Marital status: Significant Other    Spouse name: Not on file   Number of children: 4   Years of education: Not on file   Highest education level: Not on file  Occupational History   Occupation: disability  Tobacco Use   Smoking status: Never    Passive exposure: Past   Smokeless tobacco: Never  Vaping Use   Vaping status: Never Used  Substance and Sexual Activity   Alcohol use: No   Drug use: No   Sexual activity: Not on file  Other Topics Concern   Not on file  Social History Narrative   FROM [place], [place]    Lives in a 2 story apartment.  His cousin is currently staying with him.  Has 4 children.  On disability for the past 10 years for pulmonary embolism, bipolar disease.  Education:  high school.   Social Determinants of Health   Financial Resource Strain: Not on file  Food Insecurity: No Food Insecurity (07/17/2022)   Hunger Vital Sign    Worried About Running Out of Food in the Last Year: Never true    Ran Out of Food in the Last Year: Never true  Transportation Needs: No Transportation Needs (07/17/2022)   PRAPARE - Administrator, Civil Service (Medical): No    Lack of Transportation (Non-Medical): No  Physical Activity: Not on file  Stress: Not on file  Social Connections: Unknown (07/31/2021)   [facility], [facility]   Social Network    Social Network: Not on file  Intimate Partner Violence: Unknown (07/17/2022)   Humiliation, Afraid, Rape, and Kick questionnaire    Fear of Current or Ex-Partner: No    Emotionally Abused: No    Physically Abused: Not on file    Sexually Abused: Not on file   ROS: All systems reviewed and negative except as per HPI.   Current Outpatient Medications  Medication Sig Dispense Refill   dabigatran (PRADAXA) 150 MG CAPS capsule TAKE 1 CAPSULE(150 MG) BY MOUTH TWICE DAILY 180 capsule 3   doxepin (SINEQUAN) 75 MG capsule Take 75 mg by mouth at bedtime.     empagliflozin (JARDIANCE) 10 MG TABS tablet Take 1 tablet (10 mg total) by mouth daily before breakfast. 30 tablet 11   furosemide (LASIX) 40 MG tablet Take 1 tablet (40 mg total) by mouth as needed for fluid or edema (take if a weight gain of 3 lbs within 24 hours or 5 lbs in a week.). 90 tablet 3   mirtazapine (REMERON) 45 MG tablet Take 45 mg by mouth at bedtime.     potassium chloride SA (KLOR-CON M) 20 MEQ tablet Take 40 mEq by mouth daily.     pravastatin (PRAVACHOL) 10 MG tablet Take 10 mg by mouth at bedtime.     QUEtiapine (SEROQUEL XR) 400 MG 24 hr tablet Take 800 mg by mouth at bedtime.     sacubitril-valsartan (ENTRESTO) 97-103 MG Take 1 tablet by mouth 2 (two) times daily. 60 tablet 11   spironolactone (ALDACTONE) 25 MG tablet Take 1  tablet (25 mg total) by mouth daily. 30 tablet 11   tiZANidine (ZANAFLEX) 4 MG tablet Take 4 mg by mouth 2 (two) times daily as needed for muscle spasms.     carvedilol (COREG) 12.5 MG tablet Take 1.5 tablets (18.75 mg total) by mouth 2 (two) times daily. 200 tablet 3   No current facility-administered medications for this encounter.   BP 110/70   Pulse 85   Wt 100.4 kg (221 lb 6.4 oz)   SpO2 96%   BMI 31.77 kg/m   Wt Readings from Last 3 Encounters:  02/06/23 100.4 kg (221 lb 6.4 oz)  01/31/23 99.3 kg (219 lb)  01/22/23  97.5 kg (215 lb)   General: NAD Neck: No JVD, no thyromegaly or thyroid nodule.  Lungs: Clear to auscultation bilaterally with normal respiratory effort. CV: Nondisplaced PMI.  Heart regular S1/S2, no S3/S4, no murmur.  No peripheral edema.  No carotid bruit.  Normal pedal pulses.  Abdomen: Soft, nontender, no hepatosplenomegaly, no distention.  Skin: Intact without lesions or rashes.  Neurologic: Alert and oriented x 3.  Psych: Normal affect. Extremities: No clubbing or cyanosis.  HEENT: Normal.   Assessment/plan: 1. Venous thromboembolism: PE in 2009, DVT in 7/23 while on Eliquis.  Switched to warfarin and had spontaneous left thigh hematoma while INR supratherapeutic on warfarin. LE dopplers (4/24) negative for DVT. Now on Pradaxa.   - Continue Pradaxa.  2. Chronic systolic CHF: Nonischemic cardiomyopathy.  Echo (1/24) with EF 20-25%, severe LV dilation, normal RV, moderate-severe Justin. TEE in 3/24 showed EF 25-30%, moderate LV dilation, mildly decreased RV function, only mild Justin.  LHC/RHC in 3/24 with mild nonobstructive CAD, normal filling pressures, preserved cardiac output.  Echo in 9/24 showed EF 30-35% with global hypokinesis, normal RV, no significant Justin.  Cardiac MRI in 11/24 showed  LV EF 30% with mild LV dilation, RV EF 45%, subtle mid-wall LGE in the septum (nonspecific, seen with dilated cardiomyopathy), ECV 33%.   He denies history of ETOH or drugs, no  family history of cardiomyopathy or SCD.  He has a history of HTN. NYHA class II, not volume overloaded on exam. - Continue Entresto 97/103 mg bid. BMET/BNP today.  - He has Lasix for prn use, has not used.  - Continue Jardiance 10 mg daily.  - Continue spironolactone 25 mg daily.  - Increase Coreg to 18.75 mg bid.   - EF persistently < 35% with narrow QRS.  I will refer to EP for ICD.   3. Mitral regurgitation: Moderate-severe on 1/24 echo.  However, TEE in 3/24 showed only mild Justin. Echo in 9/24 showed minimal Justin.  4. HTN: BP controlled.   5. OSA: Using CPAP.   Followup 2 months with APP.   Marca Ancona  02/06/2023

## 2023-02-06 NOTE — Progress Notes (Addendum)
Paramedicine Encounter   Patient ID: Justin [name] , male,   DOB: [birth year],57 y.o.,  MRN: [id number]   Met patient in clinic today with provider.  Weight @ clinic-221 B/P-110/70 P-85 [id number]  Cardiac MRI- 30% to left side  Rt side looked good.  He is a candidate for defib to be inserted.  He is going to be sent to EP to get this done.   Med changes-increase carvedilol 18.75mg  BID Will go out tomor to make these changes. He did not bring pill box and meds with him.    Labs done today.    Kerry Hough, EMT-Paramedic [phone] 02/06/2023

## 2023-02-07 ENCOUNTER — Other Ambulatory Visit (HOSPITAL_COMMUNITY): Payer: Self-pay

## 2023-02-07 NOTE — Progress Notes (Signed)
  Electrophysiology Office Note:   Date:  02/07/2023  ID:  SALIM ALONGE, DOB 1965/08/31, MRN [id number]  Primary Cardiologist: Christell Constant, MD Electrophysiologist: Nobie Putnam, MD      History of Present Illness:   Justin [name] is a 57 y.o. male with h/o PE/DVT, bipolar disorder, and chronic systolic heart failure who is being seen today for ICD evaluation at the request of Dr. Shirlee Latch.   Patient had developed ongoing shortness of breath an echocardiogram was performed in January 2024.  This showed an LVEF of 20 to 25% with severe LV dilatation.  Patient was started on GDMT.  He has been compliant with his medications.  Repeat cardiac MRI on 11/24 showed LVEF of 30%. Patient is doing relatively well. 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 He has no new or acute complaints today.   Review of systems complete and found to be negative unless listed in HPI.   EP Information / Studies Reviewed:    EKG is not ordered today. EKG from 02/06/23 reviewed which showed sinus rhythm with PACs and QRS duration of 96 ms    Echo 12/11/2022: Moderately decreased LV function, LVEF 30 to 35%.  Global hypokinesis.  Grade 1 diastolic dysfunction. Normal RV for size and function. Mildly dilated left atrium. No significant valvular disease.  MRI 01/30/23: IMPRESSION: 1.  Mildly dilated left ventricle with global hypokinesis, EF 30%.   2.  Normal RV size with mild hypokinesis, EF 45%.   3. There was a subtle mid-wall stripe of LGE in the basal-mid septum. This is nonspecific and can be seen in dilated cardiomyopathies.   4. Mildly elevated extracellular volume percentage at 33%, suggesting elevated myocardial fibrotic content.   Physical Exam:   VS:  There were no vitals taken for this visit.   Wt Readings from Last 3 Encounters:  02/06/23 221 lb 6.4 oz (100.4 kg)  01/31/23 219 lb (99.3 kg)  01/22/23 215 lb (97.5 kg)     GEN: Well nourished, well developed in  no acute distress NECK: No JVD; No carotid bruits CARDIAC: Normal rate and regular rhythm.  RESPIRATORY:  Clear to auscultation without rales, wheezing or rhonchi  ABDOMEN: Soft, non-tender, non-distended EXTREMITIES:  No edema; No deformity   ASSESSMENT AND PLAN:   Justin [name] is a 57 y.o. male with h/o PE/DVT, bipolar disorder, and chronic systolic heart failure who is being seen today for ICD evaluation at the request of Dr. Shirlee Latch.  Patient has been on guideline directed medical therapy for greater than 90 days and his EF remains below 35%.  He has an underlying narrow QRS.  He is an appropriate candidate for primary prevention ICD.  # Chronic systolic heart failure: NYHA class II.  LVEF 30% by MRI. # Non-ischemic cardiomyopathy: -Continue GDMT and excellent care with our heart failure colleagues. -Patient screened for S-ICD and passed. Given his narrow QRS with no anticipated pacing needs, no VT history, and primary prevention indication, we have chosen S-ICD to minimize long term infectious risks.  -Explained risks, benefits, and alternatives to ICD implantation, including but not limited to bleeding, infection, damage to heart or lungs, heart attack, stroke, or death.  Pt verbalized understanding and agrees to proceed if indicated.    Total time of encounter: 50 minutes total time of encounter, including chart review, face-to-face patient care, coordination of care and counseling regarding high complexity medical decision making.   Signed, Nobie Putnam, MD

## 2023-02-07 NOTE — Progress Notes (Signed)
Came out today for med rec post med changes yesterday from clinic and to fill pill box.  It appears no missed doses this week.   Meds verified and pill box refilled.   Refills needed- Doxepin -he called that in while I was here  Kerry Hough, EMT-Paramedic  [phone] 02/07/2023

## 2023-02-08 ENCOUNTER — Encounter: Payer: Self-pay | Admitting: Cardiology

## 2023-02-08 ENCOUNTER — Other Ambulatory Visit: Payer: Self-pay

## 2023-02-08 ENCOUNTER — Ambulatory Visit: Payer: 59 | Attending: Cardiology | Admitting: Cardiology

## 2023-02-08 VITALS — BP 102/70 | HR 84 | Ht 70.0 in | Wt 226.6 lb

## 2023-02-08 DIAGNOSIS — I5022 Chronic systolic (congestive) heart failure: Secondary | ICD-10-CM

## 2023-02-08 DIAGNOSIS — I428 Other cardiomyopathies: Secondary | ICD-10-CM | POA: Diagnosis not present

## 2023-02-08 NOTE — Patient Instructions (Addendum)
Medication Instructions:  Your physician recommends that you continue on your current medications as directed. Please refer to the Current Medication list given to you today.  *If you need a refill on your cardiac medications before your next appointment, please call your pharmacy*  Lab Work: BMET and CBC  If you have labs (blood work) drawn today and your tests are completely normal, you will receive your results only by: MyChart Message (if you have MyChart) OR A paper copy in the mail If you have any lab test that is abnormal or we need to change your treatment, we will call you to review the results.  Testing/Procedures: Your physician has recommended that you have a defibrillator inserted. An implantable cardioverter defibrillator (ICD) is a small device that is placed in your chest or, in rare cases, your abdomen. This device uses electrical pulses or shocks to help control life-threatening, irregular heartbeats that could lead the heart to suddenly stop beating (sudden cardiac arrest). Leads are attached to the ICD that goes into your heart. This is done in the hospital and usually requires an overnight stay. Please see the instruction sheet given to you today for more information.   Follow-Up: At [facility], you and your health needs are our priority.  As part of our continuing mission to provide you with exceptional heart care, we have created designated Provider Care Teams.  These Care Teams include your primary Cardiologist (physician) and Advanced Practice Providers (APPs -  Physician Assistants and Nurse Practitioners) who all work together to provide you with the care you need, when you need it.   Your next appointment:   We will call you to schedule your follow up appointments

## 2023-02-13 ENCOUNTER — Other Ambulatory Visit (HOSPITAL_COMMUNITY): Payer: Self-pay

## 2023-02-13 NOTE — Progress Notes (Signed)
Paramedicine Encounter    Patient ID: Justin [name], male    DOB: 1965/06/01, 57 y.o.   MRN: [id number]   Complaints-none   Edema-none   Compliance with meds-missed 2 pm doses   Pill box filled-yes If so, by whom-paramedic   Refills needed- Jardiance  entresto    Pt reports he is doing good. He went to the EP appoint and has defib placement sch for 12/13.   Pt reports denies increased sob, no c/p. No dizziness. No edema noted. Appetite good.   Meds verified and pill box refilled.  Will f/u next week.   BP 118/78   Pulse 72   Resp 14   Wt 220 lb (99.8 kg)   SpO2 98%   BMI 31.57 kg/m  Weight yesterday-? Last visit weight-221 @ clinic   Patient Care Team: Pa, [facility] as PCP - General (Internal Medicine) Christell Constant, MD as PCP - Cardiology (Cardiology) Nobie Putnam, MD as PCP - Electrophysiology (Cardiology)  Patient Active Problem List   Diagnosis Date Noted   Painful orthopaedic hardware [facility]) 01/22/2023   Pain in right leg 08/06/2022   Fatigue 07/31/2022   History of DVT (deep vein thrombosis) 07/17/2022   Tremors of nervous system 07/17/2022   Nonischemic cardiomyopathy (HCC) 05/18/2022   Chronic systolic CHF (congestive heart failure) (HCC) 05/18/2022   Snoring 04/04/2022   Normocytic anemia 12/22/2021   History of pulmonary embolism 10/19/2021   Status post lumbar spinal fusion 01/03/2018   Lumbar stenosis 12/23/2017   Spondylolisthesis, lumbar region 06/25/2017   Achilles tendinitis of left lower extremity 12/07/2016   Achilles tendinitis, left leg 05/17/2016   Obstructive sleep apnea on CPAP 03/07/2015   Achilles rupture, left 10/21/2014   Bipolar disorder, unspecified (HCC) 01/07/2013   Headache 01/07/2013   Chronic anticoagulation 01/04/2013    Current Outpatient Medications:    carvedilol (COREG) 12.5 MG tablet, Take 1.5 tablets (18.75 mg total) by mouth 2 (two) times daily., Disp: 200 tablet, Rfl: 3   dabigatran (PRADAXA)  150 MG CAPS capsule, TAKE 1 CAPSULE(150 MG) BY MOUTH TWICE DAILY (Patient taking differently: No sig reported), Disp: 180 capsule, Rfl: 3   doxepin (SINEQUAN) 75 MG capsule, Take 75 mg by mouth at bedtime., Disp: , Rfl:    empagliflozin (JARDIANCE) 10 MG TABS tablet, Take 1 tablet (10 mg total) by mouth daily before breakfast., Disp: 30 tablet, Rfl: 11   furosemide (LASIX) 40 MG tablet, Take 1 tablet (40 mg total) by mouth as needed for fluid or edema (take if a weight gain of 3 lbs within 24 hours or 5 lbs in a week.)., Disp: 90 tablet, Rfl: 3   mirtazapine (REMERON) 45 MG tablet, Take 45 mg by mouth at bedtime., Disp: , Rfl:    potassium chloride SA (KLOR-CON M) 20 MEQ tablet, Take 40 mEq by mouth daily., Disp: , Rfl:    pravastatin (PRAVACHOL) 10 MG tablet, Take 10 mg by mouth at bedtime., Disp: , Rfl:    QUEtiapine (SEROQUEL XR) 400 MG 24 hr tablet, Take 800 mg by mouth at bedtime., Disp: , Rfl:    sacubitril-valsartan (ENTRESTO) 97-103 MG, Take 1 tablet by mouth 2 (two) times daily., Disp: 60 tablet, Rfl: 11   spironolactone (ALDACTONE) 25 MG tablet, Take 1 tablet (25 mg total) by mouth daily., Disp: 30 tablet, Rfl: 11   tiZANidine (ZANAFLEX) 4 MG tablet, Take 4 mg by mouth 2 (two) times daily as needed for muscle spasms., Disp: , Rfl:  hydrOXYzine (ATARAX) 25 MG tablet, Take 25 mg by mouth 3 (three) times daily as needed., Disp: , Rfl:  Allergies  Allergen Reactions   Lyrica [Pregabalin] Other (See Comments)    Hallucinations      Social History   Socioeconomic History   Marital status: Significant Other    Spouse name: Not on file   Number of children: 4   Years of education: Not on file   Highest education level: Not on file  Occupational History   Occupation: disability  Tobacco Use   Smoking status: Never    Passive exposure: Past   Smokeless tobacco: Never  Vaping Use   Vaping status: Never Used  Substance and Sexual Activity   Alcohol use: No   Drug use: No    Sexual activity: Not on file  Other Topics Concern   Not on file  Social History Narrative   FROM [place], [place]    Lives in a 2 story apartment.  His cousin is currently staying with him.  Has 4 children.  On disability for the past 10 years for pulmonary embolism, bipolar disease.  Education: high school.   Social Determinants of Health   Financial Resource Strain: Not on file  Food Insecurity: No Food Insecurity (07/17/2022)   Hunger Vital Sign    Worried About Running Out of Food in the Last Year: Never true    Ran Out of Food in the Last Year: Never true  Transportation Needs: No Transportation Needs (07/17/2022)   PRAPARE - Administrator, Civil Service (Medical): No    Lack of Transportation (Non-Medical): No  Physical Activity: Not on file  Stress: Not on file  Social Connections: Unknown (07/31/2021)   [facility] from [facility]   Social Network    Social Network: Not on file  Intimate Partner Violence: Unknown (07/17/2022)   Humiliation, Afraid, Rape, and Kick questionnaire    Fear of Current or Ex-Partner: No    Emotionally Abused: No    Physically Abused: Not on file    Sexually Abused: Not on file    Physical Exam      Future Appointments  Date Time Provider Department Center  04/03/2023 11:00 AM [facility] PA/NP [facility] None  04/25/2023  2:30 PM [facility] None  04/25/2023  3:00 PM Jaci Standard, MD [facility] None       Kerry Hough, Paramedic [phone] [facility] Paramedic  02/13/23

## 2023-02-21 ENCOUNTER — Telehealth (HOSPITAL_COMMUNITY): Payer: Self-pay

## 2023-02-26 ENCOUNTER — Other Ambulatory Visit (HOSPITAL_COMMUNITY): Payer: Self-pay

## 2023-02-26 NOTE — Progress Notes (Signed)
Paramedicine Encounter    Patient ID: Justin [name], male    DOB: March 10, 1966, 57 y.o.   MRN: [id number]   Complaints-tired   Edema-none   Compliance with meds-not really   Pill box filled-yes  If so, by whom-paramedic   -leaving out the jardiance and pradaxa since surgery is on Friday and unsure when he will be home or the d/c instructions--I will f/u early next week.   Refills needed-mirtazapine, potassium, seroquel, spiro, entresto  Entresto--has it thru tues pm to thurs pm dosing.  -called these into the pharmacy while I was here.   Pt reports he is doing ok. He is nervous about his upcoming defib placement this Friday.  He -was suppose to have his last dose of pradaxa last night but he did take it this morning. He is also to hold his jardiance last dose Monday also.  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 It has not happened since that day. He reports feeling very sleepy and drowsy the past few days. He reports he is sleeping at night. This is happening even before he gets to take his bedtime dose of meds.  I did not get to see him last week to me getting behind with my work schedule and emergent calls and him having to go to work.  So he has not been taking the carvedilol correctly.  He has been taking out of both bottles and jst one pill from one bottle. Which would likely explain the tiredness lately. So he does need closer med management. I think he does good until meds are changed and he has more than one bottle of the same med.  He has not been taking it daily though. It seems very sporadically that he is taking any of his HF meds this past week. Including on taking 1 tab of his potassium.  I got him to take the rest of his AM meds while I was here. HR elevated. He said he jumped up this morning thinking he had ot work but he didn't. But again he has not been taking Heart meds daily.  I asked him to contact me  once he gets meds from pharmacy and I will come back and place entresto in pill box where needed.  I reminded him that the pradaxa and jardiance is his job to resume those meds once they tell him to start back.  Appetite good, no n/v, no bloating, no edema.      BP (!) 130/90   Pulse (!) 108   Resp 16   Wt 224 lb (101.6 kg)   SpO2 97%   BMI 32.14 kg/m  Weight yesterday-? Last visit weight-220   Patient Care Team: Pa, [facility] as PCP - General (Internal Medicine) Christell Constant, MD as PCP - Cardiology (Cardiology) Nobie Putnam, MD as PCP - Electrophysiology (Cardiology)  Patient Active Problem List   Diagnosis Date Noted   Painful orthopaedic hardware [facility]) 01/22/2023   Pain in right leg 08/06/2022   Fatigue 07/31/2022   History of DVT (deep vein thrombosis) 07/17/2022   Tremors of nervous system 07/17/2022   Nonischemic cardiomyopathy (HCC) 05/18/2022   Chronic systolic CHF (congestive heart failure) (HCC) 05/18/2022   Snoring 04/04/2022   Normocytic anemia 12/22/2021   History of pulmonary embolism 10/19/2021   Status post lumbar spinal fusion 01/03/2018   Lumbar stenosis 12/23/2017   Spondylolisthesis, lumbar region 06/25/2017   Achilles  tendinitis of left lower extremity 12/07/2016   Achilles tendinitis, left leg 05/17/2016   Obstructive sleep apnea on CPAP 03/07/2015   Achilles rupture, left 10/21/2014   Bipolar disorder, unspecified (HCC) 01/07/2013   Headache 01/07/2013   Chronic anticoagulation 01/04/2013    Current Outpatient Medications:    carvedilol (COREG) 12.5 MG tablet, Take 1.5 tablets (18.75 mg total) by mouth 2 (two) times daily., Disp: 200 tablet, Rfl: 3   dabigatran (PRADAXA) 150 MG CAPS capsule, TAKE 1 CAPSULE(150 MG) BY MOUTH TWICE DAILY (Patient taking differently: No sig reported), Disp: 180 capsule, Rfl: 3   doxepin (SINEQUAN) 75 MG capsule, Take 75 mg by mouth at bedtime., Disp: , Rfl:    empagliflozin (JARDIANCE) 10 MG TABS  tablet, Take 1 tablet (10 mg total) by mouth daily before breakfast., Disp: 30 tablet, Rfl: 11   furosemide (LASIX) 40 MG tablet, Take 1 tablet (40 mg total) by mouth as needed for fluid or edema (take if a weight gain of 3 lbs within 24 hours or 5 lbs in a week.)., Disp: 90 tablet, Rfl: 3   hydrOXYzine (ATARAX) 25 MG tablet, Take 25 mg by mouth 3 (three) times daily as needed., Disp: , Rfl:    mirtazapine (REMERON) 45 MG tablet, Take 45 mg by mouth at bedtime., Disp: , Rfl:    potassium chloride SA (KLOR-CON M) 20 MEQ tablet, Take 40 mEq by mouth daily., Disp: , Rfl:    pravastatin (PRAVACHOL) 10 MG tablet, Take 10 mg by mouth at bedtime., Disp: , Rfl:    QUEtiapine (SEROQUEL XR) 400 MG 24 hr tablet, Take 800 mg by mouth at bedtime., Disp: , Rfl:    sacubitril-valsartan (ENTRESTO) 97-103 MG, Take 1 tablet by mouth 2 (two) times daily., Disp: 60 tablet, Rfl: 11   spironolactone (ALDACTONE) 25 MG tablet, Take 1 tablet (25 mg total) by mouth daily., Disp: 30 tablet, Rfl: 11   tiZANidine (ZANAFLEX) 4 MG tablet, Take 4 mg by mouth 2 (two) times daily as needed for muscle spasms., Disp: , Rfl:  Allergies  Allergen Reactions   Lyrica [Pregabalin] Other (See Comments)    Hallucinations      Social History   Socioeconomic History   Marital status: Significant Other    Spouse name: Not on file   Number of children: 4   Years of education: Not on file   Highest education level: Not on file  Occupational History   Occupation: disability  Tobacco Use   Smoking status: Never    Passive exposure: Past   Smokeless tobacco: Never  Vaping Use   Vaping status: Never Used  Substance and Sexual Activity   Alcohol use: No   Drug use: No   Sexual activity: Not on file  Other Topics Concern   Not on file  Social History Narrative   FROM [place], [place]    Lives in a 2 story apartment.  His cousin is currently staying with him.  Has 4 children.  On disability for the past 10 years for pulmonary  embolism, bipolar disease.  Education: high school.   Social Determinants of Health   Financial Resource Strain: Not on file  Food Insecurity: No Food Insecurity (07/17/2022)   Hunger Vital Sign    Worried About Running Out of Food in the Last Year: Never true    Ran Out of Food in the Last Year: Never true  Transportation Needs: No Transportation Needs (07/17/2022)   PRAPARE - Transportation    Lack of Transportation (  Medical): No    Lack of Transportation (Non-Medical): No  Physical Activity: Not on file  Stress: Not on file  Social Connections: Unknown (07/31/2021)   Received from [facility], [facility]   Social Network    Social Network: Not on file  Intimate Partner Violence: Unknown (07/17/2022)   Humiliation, Afraid, Rape, and Kick questionnaire    Fear of Current or Ex-Partner: No    Emotionally Abused: No    Physically Abused: Not on file    Sexually Abused: Not on file    Physical Exam      Future Appointments  Date Time Provider Department Center  04/03/2023 11:00 AM [facility] PA/NP [facility] None  04/25/2023  2:30 PM [facility] LAB [facility] None  04/25/2023  3:00 PM Jaci Standard, MD [facility] None       Kerry Hough, Paramedic [phone] [facility] Paramedic  02/26/23

## 2023-02-26 NOTE — Telephone Encounter (Signed)
I had pt on schedule for today however with multiple emergency calls today I got behind schedule and he had to go to work the time I was able to see him so will f/u next week.   Kerry Hough, EMT-Paramedic  [phone] 02/21/2023

## 2023-03-01 ENCOUNTER — Ambulatory Visit (HOSPITAL_BASED_OUTPATIENT_CLINIC_OR_DEPARTMENT_OTHER): Payer: 59 | Admitting: Anesthesiology

## 2023-03-01 ENCOUNTER — Ambulatory Visit (HOSPITAL_COMMUNITY)
Admission: RE | Admit: 2023-03-01 | Discharge: 2023-03-02 | Disposition: A | Discharge status: Home / Self Care | Payer: 59 | Attending: Cardiology | Admitting: Cardiology

## 2023-03-01 ENCOUNTER — Other Ambulatory Visit: Payer: Self-pay

## 2023-03-01 ENCOUNTER — Encounter (HOSPITAL_COMMUNITY)
Admission: RE | Disposition: A | Discharge status: Home / Self Care | Payer: Self-pay | Source: Home / Self Care | Attending: Cardiology

## 2023-03-01 ENCOUNTER — Ambulatory Visit (HOSPITAL_COMMUNITY): Payer: 59 | Admitting: Anesthesiology

## 2023-03-01 DIAGNOSIS — I110 Hypertensive heart disease with heart failure: Secondary | ICD-10-CM | POA: Insufficient documentation

## 2023-03-01 DIAGNOSIS — I509 Heart failure, unspecified: Secondary | ICD-10-CM | POA: Diagnosis not present

## 2023-03-01 DIAGNOSIS — I429 Cardiomyopathy, unspecified: Secondary | ICD-10-CM | POA: Diagnosis not present

## 2023-03-01 DIAGNOSIS — Z86718 Personal history of other venous thrombosis and embolism: Secondary | ICD-10-CM | POA: Insufficient documentation

## 2023-03-01 DIAGNOSIS — I5020 Unspecified systolic (congestive) heart failure: Secondary | ICD-10-CM | POA: Diagnosis present

## 2023-03-01 DIAGNOSIS — G4730 Sleep apnea, unspecified: Secondary | ICD-10-CM | POA: Insufficient documentation

## 2023-03-01 DIAGNOSIS — I5022 Chronic systolic (congestive) heart failure: Secondary | ICD-10-CM | POA: Diagnosis not present

## 2023-03-01 DIAGNOSIS — I428 Other cardiomyopathies: Secondary | ICD-10-CM | POA: Diagnosis not present

## 2023-03-01 DIAGNOSIS — Z95810 Presence of automatic (implantable) cardiac defibrillator: Secondary | ICD-10-CM | POA: Diagnosis not present

## 2023-03-01 DIAGNOSIS — I2510 Atherosclerotic heart disease of native coronary artery without angina pectoris: Secondary | ICD-10-CM | POA: Insufficient documentation

## 2023-03-01 HISTORY — PX: SUBQ ICD IMPLANT: EP1223

## 2023-03-01 LAB — CBC WITH DIFFERENTIAL/PLATELET
Abs Immature Granulocytes: 0.03 10*3/uL (ref 0.00–0.07)
Basophils Absolute: 0 10*3/uL (ref 0.0–0.1)
Basophils Relative: 1 %
Eosinophils Absolute: 0.1 10*3/uL (ref 0.0–0.5)
Eosinophils Relative: 2 %
HCT: 40.5 % (ref 39.0–52.0)
Hemoglobin: 13.9 g/dL (ref 13.0–17.0)
Immature Granulocytes: 1 %
Lymphocytes Relative: 28 %
Lymphs Abs: 1.7 10*3/uL (ref 0.7–4.0)
MCH: 28.9 pg (ref 26.0–34.0)
MCHC: 34.3 g/dL (ref 30.0–36.0)
MCV: 84.2 fL (ref 80.0–100.0)
Monocytes Absolute: 0.4 10*3/uL (ref 0.1–1.0)
Monocytes Relative: 7 %
Neutro Abs: 3.7 10*3/uL (ref 1.7–7.7)
Neutrophils Relative %: 61 %
Platelets: 237 10*3/uL (ref 150–400)
RBC: 4.81 MIL/uL (ref 4.22–5.81)
RDW: 13.2 % (ref 11.5–15.5)
WBC: 6.1 10*3/uL (ref 4.0–10.5)
nRBC: 0 % (ref 0.0–0.2)

## 2023-03-01 LAB — BASIC METABOLIC PANEL
Anion gap: 11 (ref 5–15)
BUN: 12 mg/dL (ref 6–20)
CO2: 23 mmol/L (ref 22–32)
Calcium: 9.5 mg/dL (ref 8.9–10.3)
Chloride: 108 mmol/L (ref 98–111)
Creatinine, Ser: 1.5 mg/dL — ABNORMAL HIGH (ref 0.61–1.24)
GFR, Estimated: 53 mL/min — ABNORMAL LOW (ref >60)
Glucose, Bld: 116 mg/dL — ABNORMAL HIGH (ref 70–99)
Potassium: 4.4 mmol/L (ref 3.5–5.1)
Sodium: 142 mmol/L (ref 135–145)

## 2023-03-01 SURGERY — SUBQ ICD IMPLANT
Anesthesia: General

## 2023-03-01 MED ORDER — BUPIVACAINE HCL (PF) 0.25 % IJ SOLN
INTRAMUSCULAR | Status: DC | PRN
  Administered 2023-03-01: 60 mL
  Administered 2023-03-01: 30 mL

## 2023-03-01 MED ORDER — SODIUM CHLORIDE 0.9 % IV SOLN
INTRAVENOUS | Status: AC
  Filled 2023-03-01: qty 2

## 2023-03-01 MED ORDER — PHENYLEPHRINE 80 MCG/ML (10ML) SYRINGE FOR IV PUSH (FOR BLOOD PRESSURE SUPPORT)
PREFILLED_SYRINGE | INTRAVENOUS | Status: DC | PRN
  Administered 2023-03-01 (×2): 80 ug via INTRAVENOUS

## 2023-03-01 MED ORDER — ROCURONIUM BROMIDE 10 MG/ML (PF) SYRINGE
PREFILLED_SYRINGE | INTRAVENOUS | Status: DC | PRN
  Administered 2023-03-01: 40 mg via INTRAVENOUS
  Administered 2023-03-01: 20 mg via INTRAVENOUS

## 2023-03-01 MED ORDER — PROPOFOL 10 MG/ML IV BOLUS
INTRAVENOUS | Status: DC | PRN
  Administered 2023-03-01: 150 mg via INTRAVENOUS

## 2023-03-01 MED ORDER — BUPIVACAINE HCL (PF) 0.25 % IJ SOLN
INTRAMUSCULAR | Status: AC
  Filled 2023-03-01: qty 30

## 2023-03-01 MED ORDER — FENTANYL CITRATE (PF) 250 MCG/5ML IJ SOLN
INTRAMUSCULAR | Status: DC | PRN
  Administered 2023-03-01: 100 ug via INTRAVENOUS

## 2023-03-01 MED ORDER — SUGAMMADEX SODIUM 200 MG/2ML IV SOLN
INTRAVENOUS | Status: DC | PRN
  Administered 2023-03-01: 200 mg via INTRAVENOUS

## 2023-03-01 MED ORDER — CEFAZOLIN SODIUM-DEXTROSE 2-4 GM/100ML-% IV SOLN
2.0000 g | INTRAVENOUS | Status: AC
  Administered 2023-03-01: 2 g via INTRAVENOUS

## 2023-03-01 MED ORDER — SODIUM CHLORIDE 0.9 % IV SOLN: INTRAVENOUS | Status: DC

## 2023-03-01 MED ORDER — SACUBITRIL-VALSARTAN 97-103 MG PO TABS
1.0000 | ORAL_TABLET | Freq: Two times a day (BID) | ORAL | Status: DC
  Administered 2023-03-02: 1 via ORAL
  Filled 2023-03-01 (×2): qty 1

## 2023-03-01 MED ORDER — HYDROXYZINE HCL 25 MG PO TABS: 25.0000 mg | ORAL_TABLET | Freq: Three times a day (TID) | ORAL | Status: DC | PRN

## 2023-03-01 MED ORDER — ACETAMINOPHEN 325 MG PO TABS
325.0000 mg | ORAL_TABLET | ORAL | Status: DC | PRN
  Administered 2023-03-01 – 2023-03-02 (×3): 650 mg via ORAL
  Filled 2023-03-01 (×2): qty 2

## 2023-03-01 MED ORDER — POVIDONE-IODINE 10 % EX SWAB
2.0000 | Freq: Once | CUTANEOUS | Status: AC
  Administered 2023-03-01: 2 via TOPICAL

## 2023-03-01 MED ORDER — CARVEDILOL 6.25 MG PO TABS
18.7500 mg | ORAL_TABLET | Freq: Two times a day (BID) | ORAL | Status: DC
  Administered 2023-03-01 – 2023-03-02 (×2): 18.75 mg via ORAL
  Filled 2023-03-01 (×2): qty 1

## 2023-03-01 MED ORDER — SPIRONOLACTONE 25 MG PO TABS
25.0000 mg | ORAL_TABLET | Freq: Every day | ORAL | Status: DC
  Administered 2023-03-02: 25 mg via ORAL
  Filled 2023-03-01: qty 1

## 2023-03-01 MED ORDER — HEPARIN (PORCINE) IN NACL 1000-0.9 UT/500ML-% IV SOLN
INTRAVENOUS | Status: DC | PRN
  Administered 2023-03-01: 500 mL

## 2023-03-01 MED ORDER — ACETAMINOPHEN 325 MG PO TABS
ORAL_TABLET | ORAL | Status: AC
  Filled 2023-03-01: qty 2

## 2023-03-01 MED ORDER — EMPAGLIFLOZIN 10 MG PO TABS
10.0000 mg | ORAL_TABLET | Freq: Every day | ORAL | Status: DC
  Administered 2023-03-02: 10 mg via ORAL
  Filled 2023-03-01: qty 1

## 2023-03-01 MED ORDER — ONDANSETRON HCL 4 MG/2ML IJ SOLN
INTRAMUSCULAR | Status: DC | PRN
  Administered 2023-03-01: 4 mg via INTRAVENOUS

## 2023-03-01 MED ORDER — CEFAZOLIN SODIUM-DEXTROSE 2-4 GM/100ML-% IV SOLN
INTRAVENOUS | Status: AC
  Filled 2023-03-01: qty 100

## 2023-03-01 MED ORDER — ONDANSETRON HCL 4 MG/2ML IJ SOLN: 4.0000 mg | Freq: Four times a day (QID) | INTRAMUSCULAR | Status: DC | PRN

## 2023-03-01 MED ORDER — DOXEPIN HCL 25 MG PO CAPS
75.0000 mg | ORAL_CAPSULE | Freq: Every day | ORAL | Status: DC
  Administered 2023-03-01: 75 mg via ORAL
  Filled 2023-03-01: qty 1
  Filled 2023-03-01 (×2): qty 3

## 2023-03-01 MED ORDER — LIDOCAINE 2% (20 MG/ML) 5 ML SYRINGE
INTRAMUSCULAR | Status: DC | PRN
  Administered 2023-03-01: 60 mg via INTRAVENOUS

## 2023-03-01 MED ORDER — CEFAZOLIN SODIUM-DEXTROSE 1-4 GM/50ML-% IV SOLN
1.0000 g | Freq: Four times a day (QID) | INTRAVENOUS | Status: AC
  Administered 2023-03-01 – 2023-03-02 (×3): 1 g via INTRAVENOUS
  Filled 2023-03-01 (×3): qty 50

## 2023-03-01 MED ORDER — MIRTAZAPINE 30 MG PO TABS
45.0000 mg | ORAL_TABLET | Freq: Every day | ORAL | Status: DC
  Administered 2023-03-01: 45 mg via ORAL
  Filled 2023-03-01 (×2): qty 1

## 2023-03-01 MED ORDER — SODIUM CHLORIDE 0.9 % IV SOLN
80.0000 mg | INTRAVENOUS | Status: DC
  Administered 2023-03-01: 80 mg

## 2023-03-01 MED ORDER — QUETIAPINE FUMARATE ER 300 MG PO TB24
800.0000 mg | ORAL_TABLET | Freq: Every day | ORAL | Status: DC
  Administered 2023-03-01: 800 mg via ORAL
  Filled 2023-03-01 (×2): qty 1

## 2023-03-01 SURGICAL SUPPLY — 5 items
CABLE SURGICAL S-101-97-12 (CABLE) ×1 IMPLANT
ICD SUBCU MRI EMBLEM A219 (ICD Generator) IMPLANT
LEAD SUBQU EMBLEM 3501 (Pacemaker) IMPLANT
PAD DEFIB RADIO PHYSIO CONN (PAD) ×1 IMPLANT
TRAY PACEMAKER INSERTION (PACKS) ×1 IMPLANT

## 2023-03-01 NOTE — Plan of Care (Signed)

## 2023-03-01 NOTE — H&P (Signed)
@  ZOXWRUEA@ Electrophysiology Office Note:    Date:  03/01/2023   ID:  Justin STEEVES, DOB 1966-01-26, MRN [id number]  [facility] Cardiologist:  Christell Constant, MD  [facility] Electrophysiologist:  Nobie Putnam, MD   Referring MD: Lanier Prude, MD   Chief Complaint: chronic systolic heart failure  History of Present Illness:      [name] is a 57yo man who I am seeing today for an evaluation of chronic systolic heart failure. He is scheduled for a subcutaneous ICD today. He follows with Dr Jimmey Ralph as an outpatient. No pacing needs and has a narrow QRS. cMR in November hsowed an EF 30%. He reports NYHA II symptoms associated with his HF.     Their past medical, social and family history was reveiwed.   ROS:   Please see the history of present illness.    All other systems reviewed and are negative.    Physical Exam:    VS:  BP (!) 120/91   Pulse 98   Temp 98.9 F (37.2 C) (Oral)   Resp 17   Ht 5\' 9"  (1.753 m)   Wt 99.8 kg   SpO2 95%   BMI 32.49 kg/m     Wt Readings from Last 3 Encounters:  03/01/23 99.8 kg  02/26/23 101.6 kg  02/13/23 99.8 kg    GEN: no distress CARD: RRR, No MRG RESP: No IWOB. CTAB.       ASSESSMENT AND PLAN:    1. Chronic systolic CHF (congestive heart failure) (HCC)     NYHA II symptoms. Persistently reduced LV function despite treatment with GDMT. Plan for subcutaneous ICD today. He held his dabigatran for 3 days prior to today's procedure.  The patient has an non ischemic CM (EF 30%), NYHA Class III CHF, and CAD.  He is referred by Dr Jimmey Ralph for risk stratification of sudden death and consideration of ICD implantation.  At this time, he meets criteria for ICD implantation for primary prevention of sudden death.  I have had a thorough discussion with the patient reviewing options.  The patient and their family (if available) have had opportunities to ask questions and have them answered. The patient and I have  decided together through a shared decision making process to proceed with ICD implant at this time.    Risks, benefits, alternatives to ICD implantation were discussed in detail with the patient today. The patient understands that the risks include but are not limited to bleeding, infection, pneumothorax, perforation, tamponade, vascular damage, renal failure, MI, stroke, death, inappropriate shocks, and lead dislodgement and wishes to proceed.  We will therefore schedule device implantation at the next available time.   Plan for [employer] subcutaneous ICD.   Signed, Rossie Muskrat. Lalla Brothers, MD, [facility], [facility] 03/01/2023 1:02 PM    Electrophysiology [facility]

## 2023-03-01 NOTE — Anesthesia Postprocedure Evaluation (Signed)
Anesthesia Post Note  Patient: Justin [name]  Procedure(s) Performed: SUBQ ICD IMPLANT     Patient location during evaluation: Cath Lab Anesthesia Type: General Level of consciousness: awake and alert Pain management: pain level controlled Vital Signs Assessment: post-procedure vital signs reviewed and stable Respiratory status: spontaneous breathing, nonlabored ventilation and respiratory function stable Cardiovascular status: blood pressure returned to baseline and stable Postop Assessment: no apparent nausea or vomiting Anesthetic complications: no  No notable events documented.  Last Vitals:  Vitals:   03/01/23 1700 03/01/23 1709  BP: (!) 140/100   Pulse: 83   Resp: (!) 23   Temp:  36.6 C  SpO2: 100%     Last Pain:  Vitals:   03/01/23 1709  TempSrc: Temporal  PainSc:                  Keli Buehner,W. EDMOND

## 2023-03-01 NOTE — Anesthesia Procedure Notes (Signed)
Procedure Name: Intubation Date/Time: 03/01/2023 2:43 PM  Performed by: Hessie Diener, CRNAPre-anesthesia Checklist: Patient identified, Emergency Drugs available, Suction available and Patient being monitored Patient Re-evaluated:Patient Re-evaluated prior to induction Oxygen Delivery Method: Circle System Utilized Preoxygenation: Pre-oxygenation with 100% oxygen Induction Type: IV induction Ventilation: Mask ventilation without difficulty Laryngoscope Size: Mac and 3 Grade View: Grade II Tube type: Oral Tube size: 7.5 mm Number of attempts: 1 Airway Equipment and Method: Stylet and Oral airway Placement Confirmation: ETT inserted through vocal cords under direct vision, positive ETCO2 and breath sounds checked- equal and bilateral Tube secured with: Tape Dental Injury: Teeth and Oropharynx as per pre-operative assessment

## 2023-03-01 NOTE — Transfer of Care (Signed)
Immediate Anesthesia Transfer of Care Note  Patient: Justin [name]  Procedure(s) Performed: SUBQ ICD IMPLANT  Patient Location: PACU  Anesthesia Type:General  Level of Consciousness: awake  Airway & Oxygen Therapy: Patient Spontanous Breathing and Patient connected to face mask oxygen  Post-op Assessment: Post -op Vital signs reviewed and stable  Post vital signs: stable  Last Vitals:  Vitals Value Taken Time  BP    Temp 35.7 C 03/01/23 1635  Pulse 88 03/01/23 1635  Resp 15 03/01/23 1635  SpO2 99 % 03/01/23 1635  Vitals shown include unfiled device data.  Last Pain:  Vitals:   03/01/23 1635  TempSrc: Temporal  PainSc: Asleep         Complications: No notable events documented.

## 2023-03-01 NOTE — Anesthesia Preprocedure Evaluation (Addendum)
Anesthesia Evaluation  Patient identified by MRN, date of birth, ID band Patient awake    Reviewed: Allergy & Precautions, NPO status , Patient's Chart, lab work & pertinent test results  Airway Mallampati: II  TM Distance: >3 FB Neck ROM: Full    Dental no notable dental hx.    Pulmonary sleep apnea and Continuous Positive Airway Pressure Ventilation , PE   Pulmonary exam normal        Cardiovascular hypertension, Pt. on home beta blockers and Pt. on medications +CHF and + DVT  Normal cardiovascular exam  TTE 2024 1. Left ventricular ejection fraction, by estimation, is 30 to 35%. The  left ventricle has moderately decreased function. The left ventricle  demonstrates global hypokinesis. The left ventricular internal cavity size  was mildly dilated. Left ventricular  diastolic parameters are consistent with Grade I diastolic dysfunction  (impaired relaxation).   2. Right ventricular systolic function is normal. The right ventricular  size is normal. Tricuspid regurgitation signal is inadequate for assessing  PA pressure.   3. Left atrial size was mildly dilated.   4. The mitral valve is normal in structure. No evidence of mitral valve  regurgitation. No evidence of mitral stenosis.   5. The aortic valve is tricuspid. Aortic valve regurgitation is not  visualized. No aortic stenosis is present.   6. The inferior vena cava is normal in size with greater than 50%  respiratory variability, suggesting right atrial pressure of 3 mmHg.     Neuro/Psych  Headaches PSYCHIATRIC DISORDERS  Depression Bipolar Disorder      GI/Hepatic negative GI ROS, Neg liver ROS,,,  Endo/Other  negative endocrine ROS    Renal/GU negative Renal ROS  negative genitourinary   Musculoskeletal negative musculoskeletal ROS (+)    Abdominal   Peds  Hematology  (+) Blood dyscrasia (pradaxa)   Anesthesia Other Findings    Reproductive/Obstetrics                             Anesthesia Physical Anesthesia Plan  ASA: 4  Anesthesia Plan: General   Post-op Pain Management: Minimal or no pain anticipated   Induction: Intravenous  PONV Risk Score and Plan: 2 and Midazolam, Dexamethasone and Ondansetron  Airway Management Planned: Oral ETT  Additional Equipment:   Intra-op Plan:   Post-operative Plan: Extubation in OR  Informed Consent: I have reviewed the patients History and Physical, chart, labs and discussed the procedure including the risks, benefits and alternatives for the proposed anesthesia with the patient or authorized representative who has indicated his/her understanding and acceptance.     Dental advisory given  Plan Discussed with: CRNA  Anesthesia Plan Comments:        Anesthesia Quick Evaluation

## 2023-03-02 ENCOUNTER — Ambulatory Visit (HOSPITAL_COMMUNITY): Payer: 59

## 2023-03-02 ENCOUNTER — Encounter (HOSPITAL_COMMUNITY): Payer: Self-pay | Admitting: Cardiology

## 2023-03-02 DIAGNOSIS — I2510 Atherosclerotic heart disease of native coronary artery without angina pectoris: Secondary | ICD-10-CM | POA: Diagnosis not present

## 2023-03-02 DIAGNOSIS — Z95810 Presence of automatic (implantable) cardiac defibrillator: Secondary | ICD-10-CM | POA: Diagnosis not present

## 2023-03-02 DIAGNOSIS — I428 Other cardiomyopathies: Secondary | ICD-10-CM | POA: Diagnosis not present

## 2023-03-02 DIAGNOSIS — G4730 Sleep apnea, unspecified: Secondary | ICD-10-CM | POA: Diagnosis not present

## 2023-03-02 DIAGNOSIS — I5022 Chronic systolic (congestive) heart failure: Secondary | ICD-10-CM | POA: Diagnosis not present

## 2023-03-02 DIAGNOSIS — R0989 Other specified symptoms and signs involving the circulatory and respiratory systems: Secondary | ICD-10-CM | POA: Diagnosis not present

## 2023-03-02 DIAGNOSIS — I429 Cardiomyopathy, unspecified: Secondary | ICD-10-CM | POA: Diagnosis not present

## 2023-03-02 DIAGNOSIS — Z86718 Personal history of other venous thrombosis and embolism: Secondary | ICD-10-CM | POA: Diagnosis not present

## 2023-03-02 DIAGNOSIS — I517 Cardiomegaly: Secondary | ICD-10-CM | POA: Diagnosis not present

## 2023-03-02 DIAGNOSIS — R918 Other nonspecific abnormal finding of lung field: Secondary | ICD-10-CM | POA: Diagnosis not present

## 2023-03-02 DIAGNOSIS — I110 Hypertensive heart disease with heart failure: Secondary | ICD-10-CM | POA: Diagnosis not present

## 2023-03-02 MED ORDER — TRAMADOL HCL 50 MG PO TABS: 50.0000 mg | ORAL_TABLET | Freq: Four times a day (QID) | ORAL | Status: DC | PRN

## 2023-03-02 MED ORDER — POTASSIUM CHLORIDE CRYS ER 20 MEQ PO TBCR: EXTENDED_RELEASE_TABLET | ORAL | Status: DC

## 2023-03-02 NOTE — Discharge Instructions (Addendum)
   Supplemental Discharge Instructions following  Subcutaneous Defibrillator Implantation  MEDICATIONS Resume pRADAXA ON dECEMBER 19  Activity No lifting anything greater than 5 lbs for 10 days. No strenuous / vigorous activity for 48 hours. No driving for 48 hours. You may begin driving on Tuesday, March 05, 2023.  WOUND CARE Keep the wound area clean and dry.  You cannot shower for 7 days. You may shower on Saturday March 09, 2023. No soaking in tub bath, swimming pool or hot tub for 10-14 days until wounds are completely healed.  DO NOT apply any creams, oils, or ointments to the wound area. If you notice any drainage or discharge from the wound, any swelling or bruising at the site, or you develop a fever > 101? F after you are discharged home, call the office at once.  Special Instructions You are still able to use cellular telephones; use the ear opposite the side where you have your pacemaker/defibrillator.  Avoid carrying your cellular phone near your device. When traveling through airports, show security personnel your identification card to avoid being screened in the metal detectors.  Ask the security personnel to use the hand wand. Avoid arc welding equipment, MRI testing (magnetic resonance imaging), TENS units (transcutaneous nerve stimulators).  Call the office for questions about other devices. Avoid electrical appliances that are in poor condition or are not properly grounded. Microwave ovens are safe to be near or to operate.  Additional information for defibrillator patients should your device go off: If your device goes off ONCE and you feel fine afterward, notify the device clinic nurses. If your device goes off ONCE and you do not feel well afterward, call 911. If your device goes off TWICE, call 911. If your device goes off THREE times in one day, call 911.  DO NOT DRIVE YOURSELF OR A FAMILY MEMBER WITH A DEFIBRILLATOR TO THE HOSPITAL--CALL 911.

## 2023-03-02 NOTE — Progress Notes (Signed)
   Rounding Note    Patient Name: Justin [name] Date of Encounter: 03/02/2023  [facility] Cardiologist: Christell Constant, MD   Subjective   NAEO. Doing well after S-ICD implant yesterday. Minimal pain this AM.  Vital Signs    Vitals:   03/02/23 0000 03/02/23 0152 03/02/23 0355 03/02/23 0400  BP: 97/70 109/66  114/81  Pulse: 90 87  91  Resp: 16 16 20 20   Temp: 98.2 F (36.8 C) 98.4 F (36.9 C)  98.3 F (36.8 C)  TempSrc: Oral Oral  Oral  SpO2: 97% 98%  98%  Weight:      Height:        Intake/Output Summary (Last 24 hours) at 03/02/2023 0925 Last data filed at 03/02/2023 0500 Gross per 24 hour  Intake 900 ml  Output 1820 ml  Net -920 ml      03/01/2023   12:51 PM 02/26/2023   10:03 AM 02/13/2023   10:59 AM  Last 3 Weights  Weight (lbs) 220 lb 224 lb 220 lb  Weight (kg) 99.791 kg 101.606 kg 99.791 kg      Telemetry    Personally Reviewed  ECG    Personally Reviewed  Physical Exam    GEN: No acute distress.   Cardiac: RRR, no murmurs, rubs, or gallops. L chest without hematoma. No significant pain with palpation. Respiratory: Clear to auscultation bilaterally. Psych: Normal affect   Assessment & Plan    #Chronic systolic heart failure #NICM #ICD in situ Doing well after S-ICD implant yesterday. Hold anticoagulation until Mar 07, 2023.  OK to discharge today.    Sheria Lang T. Lalla Brothers, MD, [facility], [facility] Cardiac Electrophysiology

## 2023-03-02 NOTE — Discharge Summary (Signed)
Discharge Summary    Patient ID: Justin [name] MRN: [id number]; DOB: 01-Dec-1965  Admit date: 03/01/2023 Discharge date: 03/02/2023  PCP:  [facility]   [facility] Providers Cardiologist:  Christell Constant, MD  Electrophysiologist:  Nobie Putnam, MD       Discharge Diagnoses    Principal Problem:   ICD (implantable cardioverter-defibrillator) in place Active Problems:   Nonischemic cardiomyopathy (HCC)   HFrEF (heart failure with reduced ejection fraction) (HCC)  Diagnostic Studies/Procedures    Subcutaneous ICD implantation 03/01/2023 CONCLUSIONS:   1.  Chronic systolic heart failure secondary to nonischemic cardiomyopathy  2.  Successful subcutaneous ICD implantation  3.  Successful defibrillation threshold testing  4.  No early apparent complications.   5.  Hold anticoagulation for 5 days (okay to restart March 07, 2023) _____________   History of Present Illness     Justin [name] is a 57 y.o. male with (HFrEF) heart failure with reduced ejection fraction with an EF of 30% secondary to non-ischemic cardiomyopathy, prior PE/DVT, hypertension, chronic kidney disease. His ejection fraction remained less than 35% despite optimal GDMT. He was referred by Dr. Jimmey Ralph to Dr. Lalla Brothers for [place]-ICD implantation.   Hospital Course     Consultants: none    The patient presented to [facility] on 03/01/23 and underwent successful [place]-ICD implantation by Dr. Lalla Brothers. There were no early complications. He was seen by Dr. Lalla Brothers this morning. CXR this morning showed [place]-ICD in place. He is felt to be stable for discharge to home. He can resume his anticoagulation (Pradaxa) on 03/07/2023. He has a wound check with the device clinic on March 21, 2023 @ 3:20 PM and follow up with Otilio Saber, PA-C on 06/11/2023 @ 10 AM.    Did the patient have an acute coronary syndrome (MI, NSTEMI, STEMI, etc) this admission?:  No                               Did the patient  have a percutaneous coronary intervention (stent / angioplasty)?:  No.    _____________  Discharge Vitals Blood pressure 101/73, pulse 91, temperature 98.2 F (36.8 C), temperature source Oral, resp. rate 18, height 5\' 9"  (1.753 m), weight 99.8 kg, SpO2 98%.  Filed Weights   03/01/23 1251  Weight: 99.8 kg    Labs & Radiologic Studies    CBC Recent Labs    03/01/23 1252  WBC 6.1  NEUTROABS 3.7  HGB 13.9  HCT 40.5  MCV 84.2  PLT 237   Basic Metabolic Panel Recent Labs    40/98/11 1252  NA 142  K 4.4  CL 108  CO2 23  GLUCOSE 116*  BUN 12  CREATININE 1.50*  CALCIUM 9.5  _____________  DG Chest 2 View Result Date: 03/02/2023 CLINICAL DATA:  57 year old male status post ICD implant. EXAM: CHEST - 2 VIEW COMPARISON:  Chest x-ray 07/16/2022. FINDINGS: New electronic device projecting over the lower left hemithorax with lead extending into the midline superficial to the sternum, compatible with implantable defibrillator. Small amount of gas around the device in the left chest wall noted. Lung volumes are low. Opacities in the right lung base likely reflect subsegmental atelectasis. No definite consolidative airspace disease. No pleural effusions. No pneumothorax. No evidence of pulmonary edema. Heart size is mildly enlarged. Upper mediastinal contours are within normal limits. IMPRESSION: 1. Low lung volumes with probable subsegmental atelectasis in the right lung  base. 2. New implanted defibrillator in position, as above. Electronically Signed   By: Trudie Reed M.D.   On: 03/02/2023 09:58   EP PPM/ICD IMPLANT Result Date: 03/01/2023 CONCLUSIONS:  1.  Chronic systolic heart failure secondary to nonischemic cardiomyopathy  2.  Successful subcutaneous ICD implantation  3.  Successful defibrillation threshold testing  4.  No early apparent complications.  5.  Hold anticoagulation for 5 days (okay to restart March 07, 2023)   Disposition   Pt is being discharged home today  in good condition.  Follow-up Plans & Appointments   Discharge Instructions     (HEART FAILURE PATIENTS) Call MD:  Anytime you have any of the following symptoms: 1) 3 pound weight gain in 24 hours or 5 pounds in 1 week 2) shortness of breath, with or without a dry hacking cough 3) swelling in the hands, feet or stomach 4) if you have to sleep on extra pillows at night in order to breathe.   Complete by: As directed    Diet - low sodium heart healthy   Complete by: As directed    Discharge instructions   Complete by: As directed    See instructions on AVS.   Discharge wound care:   Complete by: As directed    See instructions on AVS   Driving Restrictions   Complete by: As directed    See instructions on AVS   Increase activity slowly   Complete by: As directed    Lifting restrictions   Complete by: As directed    See instructions on AVS        Discharge Medications   Allergies as of 03/02/2023       Reactions   Lyrica [pregabalin] Other (See Comments)   Hallucinations        Medication List     PAUSE taking these medications    dabigatran 150 MG Caps capsule Wait to take this until: March 07, 2023 Commonly known as: PRADAXA TAKE 1 CAPSULE(150 MG) BY MOUTH TWICE DAILY       TAKE these medications    carvedilol 12.5 MG tablet Commonly known as: Coreg Take 1.5 tablets (18.75 mg total) by mouth 2 (two) times daily.   cyclobenzaprine 10 MG tablet Commonly known as: FLEXERIL Take 10 mg by mouth 3 (three) times daily as needed for muscle spasms.   doxepin 75 MG capsule Commonly known as: SINEQUAN Take 75 mg by mouth at bedtime.   empagliflozin 10 MG Tabs tablet Commonly known as: Jardiance Take 1 tablet (10 mg total) by mouth daily before breakfast.   Entresto 97-103 MG Generic drug: sacubitril-valsartan Take 1 tablet by mouth 2 (two) times daily.   furosemide 40 MG tablet Commonly known as: LASIX Take 1 tablet (40 mg total) by mouth as needed  for fluid or edema (take if a weight gain of 3 lbs within 24 hours or 5 lbs in a week.).   hydrOXYzine 25 MG tablet Commonly known as: ATARAX Take 25 mg by mouth 3 (three) times daily as needed for anxiety.   mirtazapine 45 MG tablet Commonly known as: REMERON Take 45 mg by mouth at bedtime.   potassium chloride SA 20 MEQ tablet Commonly known as: KLOR-CON M Take as directed. What changed:  how much to take how to take this when to take this additional instructions   QUEtiapine 400 MG 24 hr tablet Commonly known as: SEROQUEL XR Take 800 mg by mouth at bedtime.   spironolactone 25 MG  tablet Commonly known as: Aldactone Take 1 tablet (25 mg total) by mouth daily.               Discharge Care Instructions  (From admission, onward)           Start     Ordered   03/02/23 0000  Discharge wound care:       Comments: See instructions on AVS   03/02/23 1102               Outstanding Labs/Studies   Duration of Discharge Encounter   Greater than 30 minutes including physician time.  Signed, Tereso Newcomer, PA-C 03/02/2023, 11:05 AM

## 2023-03-04 ENCOUNTER — Encounter (HOSPITAL_COMMUNITY): Payer: Self-pay | Admitting: Cardiology

## 2023-03-04 ENCOUNTER — Telehealth: Payer: Self-pay

## 2023-03-04 NOTE — Telephone Encounter (Signed)
Follow-up after same day discharge: Implant date: 03/01/2023 MD: CL Device: ICD BSX Location: L CHEST   Wound check visit: YES 90 day MD follow-up: YES  Remote Transmission received:YES  Dressing/sling removed: N/A  Confirm OAC restart on: N/A  Please continue to monitor your cardiac device site for redness, swelling, and drainage. Call the device clinic at [phone] if you experience these symptoms, fever/chills, or have questions about your device.   Remote monitoring is used to monitor your cardiac device from home. This monitoring is scheduled every 91 days by our office. It allows [place] to keep an eye on the functioning of your device to ensure it is working properly.   LVM for pt to call device clinic

## 2023-03-06 ENCOUNTER — Other Ambulatory Visit (HOSPITAL_COMMUNITY): Payer: Self-pay | Admitting: Family Medicine

## 2023-03-06 ENCOUNTER — Other Ambulatory Visit (HOSPITAL_COMMUNITY): Payer: Self-pay

## 2023-03-06 NOTE — Progress Notes (Signed)
Paramedicine Encounter    Patient ID: Justin [name], male    DOB: 02/05/1966, 57 y.o.   MRN: [id number]   Complaints-none   Edema-none   Compliance with meds-not sure what he's been doing since he been home   Pill box filled-yes  If so, by whom-paramedic   Refills needed-doxepin, pravastatin , potassium, pradaxa, entresto was 49-51mg     Pt had ICD placed this past Friday. He still has the dressings on both incisions. He has a smaller bandage over his abd area and then a larger one to the left side area, that one has old blood noted thru the bandage. He was speaking to nurse at office ref the bandages and it was advised he could take off the bandages but leave the steri-strips in place. He asked that I help him do that, so I removed the tegaderm bandage and under it was the steristrips which was left in place.  He got the 49-51 mg from pharmacy- he is on the 97-103.   Pt denies increased sob, no dizziness, no c/p. Meds verified and pill box refilled. He speaks confusingly about his pill box use. He got the entresto and had opened it, which it was not needed in pill box--so I asked if he took some from the bottle along with the pill box and at first he said yes, then after confirming what he just said, then he said he wasn't sure what he is doing. I recommended he move to bubble packs once things gets settled down for him, and he is open to moving pharmacies since [facility] doesn't do the bubble packs. So will re-evaluate in 1-43mths and get that transitioned over for him to avoid this confusion and that pharmacy sends in old doses and he has taken same med from both bottle and when he had meds in pill box, so not sure what was going on there. But I think this will help with the confusion.     BP 122/82   Pulse 82   Resp 16   Wt 222 lb (100.7 kg)   SpO2 98%   BMI 32.78 kg/m  Weight yesterday--? Last visit weight-224  Patient Care Team: Pa, [facility] as PCP - General (Internal  Medicine) Christell Constant, MD as PCP - Cardiology (Cardiology) Nobie Putnam, MD as PCP - Electrophysiology (Cardiology)  Patient Active Problem List   Diagnosis Date Noted   ICD (implantable cardioverter-defibrillator) in place 03/01/2023   Painful orthopaedic hardware (HCC) 01/22/2023   Pain in right leg 08/06/2022   Fatigue 07/31/2022   History of DVT (deep vein thrombosis) 07/17/2022   Tremors of nervous system 07/17/2022   Nonischemic cardiomyopathy (HCC) 05/18/2022   HFrEF (heart failure with reduced ejection fraction) (HCC) 05/18/2022   Snoring 04/04/2022   Normocytic anemia 12/22/2021   History of pulmonary embolism 10/19/2021   Status post lumbar spinal fusion 01/03/2018   Lumbar stenosis 12/23/2017   Spondylolisthesis, lumbar region 06/25/2017   Achilles tendinitis of left lower extremity 12/07/2016   Achilles tendinitis, left leg 05/17/2016   Obstructive sleep apnea on CPAP 03/07/2015   Achilles rupture, left 10/21/2014   Bipolar disorder, unspecified (HCC) 01/07/2013   Headache 01/07/2013   Chronic anticoagulation 01/04/2013    Current Outpatient Medications:    carvedilol (COREG) 12.5 MG tablet, Take 1.5 tablets (18.75 mg total) by mouth 2 (two) times daily., Disp: 200 tablet, Rfl: 3   cyclobenzaprine (FLEXERIL) 10 MG tablet, Take 10 mg by mouth 3 (three) times daily as  needed for muscle spasms., Disp: , Rfl:    doxepin (SINEQUAN) 75 MG capsule, Take 75 mg by mouth at bedtime., Disp: , Rfl:    empagliflozin (JARDIANCE) 10 MG TABS tablet, Take 1 tablet (10 mg total) by mouth daily before breakfast., Disp: 30 tablet, Rfl: 11   mirtazapine (REMERON) 45 MG tablet, Take 45 mg by mouth at bedtime., Disp: , Rfl:    QUEtiapine (SEROQUEL XR) 400 MG 24 hr tablet, Take 800 mg by mouth at bedtime., Disp: , Rfl:    sacubitril-valsartan (ENTRESTO) 97-103 MG, Take 1 tablet by mouth 2 (two) times daily., Disp: 60 tablet, Rfl: 11   spironolactone (ALDACTONE) 25 MG tablet,  Take 1 tablet (25 mg total) by mouth daily., Disp: 30 tablet, Rfl: 11   [Paused] dabigatran (PRADAXA) 150 MG CAPS capsule, TAKE 1 CAPSULE(150 MG) BY MOUTH TWICE DAILY, Disp: 180 capsule, Rfl: 3   furosemide (LASIX) 40 MG tablet, Take 1 tablet (40 mg total) by mouth as needed for fluid or edema (take if a weight gain of 3 lbs within 24 hours or 5 lbs in a week.)., Disp: 90 tablet, Rfl: 3   hydrOXYzine (ATARAX) 25 MG tablet, Take 25 mg by mouth 3 (three) times daily as needed for anxiety., Disp: , Rfl:    potassium chloride SA (KLOR-CON M) 20 MEQ tablet, Take as directed., Disp: , Rfl:  Allergies  Allergen Reactions   Lyrica [Pregabalin] Other (See Comments)    Hallucinations      Social History   Socioeconomic History   Marital status: Significant Other    Spouse name: Not on file   Number of children: 4   Years of education: Not on file   Highest education level: Not on file  Occupational History   Occupation: disability  Tobacco Use   Smoking status: Never    Passive exposure: Past   Smokeless tobacco: Never  Vaping Use   Vaping status: Never Used  Substance and Sexual Activity   Alcohol use: No   Drug use: No   Sexual activity: Not on file  Other Topics Concern   Not on file  Social History Narrative   FROM [place], [place]    Lives in a 2 story apartment.  His cousin is currently staying with him.  Has 4 children.  On disability for the past 10 years for pulmonary embolism, bipolar disease.  Education: high school.   Social Drivers of Corporate investment banker Strain: Not on file  Food Insecurity: Patient Declined (03/01/2023)   Hunger Vital Sign    Worried About Running Out of Food in the Last Year: Patient declined    Ran Out of Food in the Last Year: Patient declined  Transportation Needs: No Transportation Needs (03/01/2023)   PRAPARE - Administrator, Civil Service (Medical): No    Lack of Transportation (Non-Medical): No  Physical Activity: Not on  file  Stress: Not on file  Social Connections: Unknown (07/31/2021)   Received from [facility], [facility]   Social Network    Social Network: Not on file  Intimate Partner Violence: Not At Risk (03/01/2023)   Humiliation, Afraid, Rape, and Kick questionnaire    Fear of Current or Ex-Partner: No    Emotionally Abused: No    Physically Abused: No    Sexually Abused: No    Physical Exam      Future Appointments  Date Time Provider Department Center  03/21/2023  3:20 PM [facility] DEVICE 1 [facility]  04/03/2023 11:00 AM [facility] PA/NP [facility] None  04/25/2023  2:30 PM [facility] LAB [facility] None  04/25/2023  3:00 PM Jaci Standard, MD [facility] None  06/03/2023  7:20 AM [facility] DEVICE REMOTES [facility]  06/11/2023 10:00 AM Graciella Freer, PA-C [facility]  09/02/2023  7:20 AM [facility] DEVICE REMOTES [facility]  12/02/2023  7:20 AM [facility] DEVICE REMOTES [facility]  03/02/2024  7:20 AM [facility] DEVICE REMOTES [facility]  06/01/2024  7:20 AM [facility] DEVICE REMOTES [facility]       Kerry Hough, Paramedic [phone] [facility] Paramedic  03/06/23

## 2023-03-06 NOTE — Telephone Encounter (Signed)
I let the pt know he can take the clear dressing off. And he starts [facility] 03/07/2023.

## 2023-03-11 ENCOUNTER — Other Ambulatory Visit (HOSPITAL_COMMUNITY): Payer: Self-pay | Admitting: Family Medicine

## 2023-03-11 ENCOUNTER — Other Ambulatory Visit (HOSPITAL_COMMUNITY): Payer: Self-pay

## 2023-03-11 MED ORDER — PRAVASTATIN SODIUM 10 MG PO TABS: 10.0000 mg | ORAL_TABLET | Freq: Every day | ORAL | 6 refills | Status: DC

## 2023-03-11 NOTE — Progress Notes (Signed)
Paramedicine Encounter    Patient ID: Justin [name], male    DOB: 08-18-1965, 57 y.o.   MRN: [id number]   Came out today for med rec since this is a holiday week.  He reports doing ok, still tender to the incision sites.  Pharmacy did not give him potassium, pravastatin or pradaxa as I requested last week. She said potassium was denied.  Sent message to triage ref this and also his pravastatin.  Will f/u next week.  He is missing pradaxa in one PM dose and missing potassium in 2 doses. Advised him to where these need to go, these are easy since they are big and can easily see where they need to go.   Meds verified and pill box refilled.  Will f/u next week.    Patient Care Team: Pa, [facility] as PCP - General (Internal Medicine) Christell Constant, MD as PCP - Cardiology (Cardiology) Nobie Putnam, MD as PCP - Electrophysiology (Cardiology)  Patient Active Problem List   Diagnosis Date Noted   ICD (implantable cardioverter-defibrillator) in place 03/01/2023   Painful orthopaedic hardware (HCC) 01/22/2023   Pain in right leg 08/06/2022   Fatigue 07/31/2022   History of DVT (deep vein thrombosis) 07/17/2022   Tremors of nervous system 07/17/2022   Nonischemic cardiomyopathy (HCC) 05/18/2022   HFrEF (heart failure with reduced ejection fraction) (HCC) 05/18/2022   Snoring 04/04/2022   Normocytic anemia 12/22/2021   History of pulmonary embolism 10/19/2021   Status post lumbar spinal fusion 01/03/2018   Lumbar stenosis 12/23/2017   Spondylolisthesis, lumbar region 06/25/2017   Achilles tendinitis of left lower extremity 12/07/2016   Achilles tendinitis, left leg 05/17/2016   Obstructive sleep apnea on CPAP 03/07/2015   Achilles rupture, left 10/21/2014   Bipolar disorder, unspecified (HCC) 01/07/2013   Headache 01/07/2013   Chronic anticoagulation 01/04/2013    Current Outpatient Medications:    carvedilol (COREG) 12.5 MG tablet, Take 1.5 tablets (18.75 mg total)  by mouth 2 (two) times daily., Disp: 200 tablet, Rfl: 3   cyclobenzaprine (FLEXERIL) 10 MG tablet, Take 10 mg by mouth 3 (three) times daily as needed for muscle spasms., Disp: , Rfl:    dabigatran (PRADAXA) 150 MG CAPS capsule, TAKE 1 CAPSULE(150 MG) BY MOUTH TWICE DAILY, Disp: 180 capsule, Rfl: 3   doxepin (SINEQUAN) 75 MG capsule, Take 75 mg by mouth at bedtime., Disp: , Rfl:    empagliflozin (JARDIANCE) 10 MG TABS tablet, Take 1 tablet (10 mg total) by mouth daily before breakfast., Disp: 30 tablet, Rfl: 11   hydrOXYzine (ATARAX) 25 MG tablet, Take 25 mg by mouth 3 (three) times daily as needed for anxiety., Disp: , Rfl:    mirtazapine (REMERON) 45 MG tablet, Take 45 mg by mouth at bedtime., Disp: , Rfl:    potassium chloride SA (KLOR-CON M) 20 MEQ tablet, Take as directed., Disp: , Rfl:    pravastatin (PRAVACHOL) 10 MG tablet, Take 10 mg by mouth at bedtime., Disp: , Rfl:    QUEtiapine (SEROQUEL XR) 400 MG 24 hr tablet, Take 800 mg by mouth at bedtime., Disp: , Rfl:    sacubitril-valsartan (ENTRESTO) 97-103 MG, Take 1 tablet by mouth 2 (two) times daily., Disp: 60 tablet, Rfl: 11   spironolactone (ALDACTONE) 25 MG tablet, Take 1 tablet (25 mg total) by mouth daily., Disp: 30 tablet, Rfl: 11   furosemide (LASIX) 40 MG tablet, Take 1 tablet (40 mg total) by mouth as needed for fluid or edema (take if a  weight gain of 3 lbs within 24 hours or 5 lbs in a week.). (Patient not taking: Reported on 03/11/2023), Disp: 90 tablet, Rfl: 3 Allergies  Allergen Reactions   Lyrica [Pregabalin] Other (See Comments)    Hallucinations      Social History   Socioeconomic History   Marital status: Significant Other    Spouse name: Not on file   Number of children: 4   Years of education: Not on file   Highest education level: Not on file  Occupational History   Occupation: disability  Tobacco Use   Smoking status: Never    Passive exposure: Past   Smokeless tobacco: Never  Vaping Use   Vaping  status: Never Used  Substance and Sexual Activity   Alcohol use: No   Drug use: No   Sexual activity: Not on file  Other Topics Concern   Not on file  Social History Narrative   FROM [place], [place]    Lives in a 2 story apartment.  His cousin is currently staying with him.  Has 4 children.  On disability for the past 10 years for pulmonary embolism, bipolar disease.  Education: high school.   Social Drivers of Corporate investment banker Strain: Not on file  Food Insecurity: Patient Declined (03/01/2023)   Hunger Vital Sign    Worried About Running Out of Food in the Last Year: Patient declined    Ran Out of Food in the Last Year: Patient declined  Transportation Needs: No Transportation Needs (03/01/2023)   PRAPARE - Administrator, Civil Service (Medical): No    Lack of Transportation (Non-Medical): No  Physical Activity: Not on file  Stress: Not on file  Social Connections: Unknown (07/31/2021)   Received from [facility], [facility]   Social Network    Social Network: Not on file  Intimate Partner Violence: Not At Risk (03/01/2023)   Humiliation, Afraid, Rape, and Kick questionnaire    Fear of Current or Ex-Partner: No    Emotionally Abused: No    Physically Abused: No    Sexually Abused: No    Physical Exam      Future Appointments  Date Time Provider Department Center  03/21/2023  3:20 PM [facility] DEVICE 1 [facility]  04/03/2023 11:00 AM [facility] PA/NP [facility] None  04/25/2023  2:30 PM [facility] LAB [facility] None  04/25/2023  3:00 PM Jaci Standard, MD [facility] None  06/03/2023  7:20 AM [facility] DEVICE REMOTES [facility]  06/11/2023 10:00 AM Graciella Freer, PA-C [facility]  09/02/2023  7:20 AM [facility] DEVICE REMOTES [facility]  12/02/2023  7:20 AM [facility] DEVICE REMOTES [facility]  03/02/2024  7:20 AM [facility] DEVICE REMOTES [facility]   06/01/2024  7:20 AM [facility] DEVICE REMOTES [facility]       Kerry Hough, Paramedic [phone] [facility] Paramedic  03/11/23

## 2023-03-19 ENCOUNTER — Other Ambulatory Visit (HOSPITAL_COMMUNITY): Payer: Self-pay

## 2023-03-19 NOTE — Progress Notes (Signed)
 Paramedicine Encounter    Patient ID: Justin [name], male    DOB: 20-Oct-1965, 57 y.o.   MRN: [id number]   Complaints-none   Edema-none   Compliance with meds-yes appears so   Pill box filled-yes  If so, by whom-paramedic  Refills needed-none     Pt reports he is doing good. He reports still some tenderness to the device incision area, but overall doing well.  The steri strips came off of the larger incision site. No bleeding noted. Looks like it is healing good.  Pt is still good with changing pharmacies. He has all meds at this time so moving forward any refills needed then will get [facility] to fill and eventually move to bubble packs.  Meds verified and pill box refilled.    BP 136/80   Pulse 76   Resp 16   Wt 226 lb (102.5 kg) Comment: with big shoes jeans and sweatshirt  SpO2 97%   BMI 33.37 kg/m  Weight yesterday-? Last visit weight-222  Patient Care Team: Pa, [facility] as PCP - General (Internal Medicine) Santo Stanly LABOR, MD as PCP - Cardiology (Cardiology) Kennyth Chew, MD as PCP - Electrophysiology (Cardiology)  Patient Active Problem List   Diagnosis Date Noted   ICD (implantable cardioverter-defibrillator) in place 03/01/2023   Painful orthopaedic hardware (HCC) 01/22/2023   Pain in right leg 08/06/2022   Fatigue 07/31/2022   History of DVT (deep vein thrombosis) 07/17/2022   Tremors of nervous system 07/17/2022   Nonischemic cardiomyopathy (HCC) 05/18/2022   HFrEF (heart failure with reduced ejection fraction) (HCC) 05/18/2022   Snoring 04/04/2022   Normocytic anemia 12/22/2021   History of pulmonary embolism 10/19/2021   Status post lumbar spinal fusion 01/03/2018   Lumbar stenosis 12/23/2017   Spondylolisthesis, lumbar region 06/25/2017   Achilles tendinitis of left lower extremity 12/07/2016   Achilles tendinitis, left leg 05/17/2016   Obstructive sleep apnea on CPAP 03/07/2015   Achilles rupture, left 10/21/2014   Bipolar  disorder, unspecified (HCC) 01/07/2013   Headache 01/07/2013   Chronic anticoagulation 01/04/2013    Current Outpatient Medications:    carvedilol  (COREG ) 12.5 MG tablet, Take 1.5 tablets (18.75 mg total) by mouth 2 (two) times daily., Disp: 200 tablet, Rfl: 3   cyclobenzaprine  (FLEXERIL ) 10 MG tablet, Take 10 mg by mouth 3 (three) times daily as needed for muscle spasms., Disp: , Rfl:    dabigatran  (PRADAXA ) 150 MG CAPS capsule, TAKE 1 CAPSULE(150 MG) BY MOUTH TWICE DAILY, Disp: 180 capsule, Rfl: 3   doxepin  (SINEQUAN ) 75 MG capsule, Take 75 mg by mouth at bedtime., Disp: , Rfl:    empagliflozin  (JARDIANCE ) 10 MG TABS tablet, Take 1 tablet (10 mg total) by mouth daily before breakfast., Disp: 30 tablet, Rfl: 11   hydrOXYzine  (ATARAX ) 25 MG tablet, Take 25 mg by mouth 3 (three) times daily as needed for anxiety., Disp: , Rfl:    mirtazapine  (REMERON ) 45 MG tablet, Take 45 mg by mouth at bedtime., Disp: , Rfl:    potassium chloride  SA (KLOR-CON  M) 20 MEQ tablet, Take 2 tablets (40 mEq total) by mouth daily., Disp: 60 tablet, Rfl: 3   pravastatin  (PRAVACHOL ) 10 MG tablet, Take 1 tablet (10 mg total) by mouth at bedtime., Disp: 30 tablet, Rfl: 6   QUEtiapine  (SEROQUEL  XR) 400 MG 24 hr tablet, Take 800 mg by mouth at bedtime., Disp: , Rfl:    sacubitril -valsartan  (ENTRESTO ) 97-103 MG, Take 1 tablet by mouth 2 (two) times daily., Disp: 60  tablet, Rfl: 11   spironolactone  (ALDACTONE ) 25 MG tablet, Take 1 tablet (25 mg total) by mouth daily., Disp: 30 tablet, Rfl: 11   furosemide  (LASIX ) 40 MG tablet, Take 1 tablet (40 mg total) by mouth as needed for fluid or edema (take if a weight gain of 3 lbs within 24 hours or 5 lbs in a week.). (Patient not taking: Reported on 03/11/2023), Disp: 90 tablet, Rfl: 3 Allergies  Allergen Reactions   Lyrica [Pregabalin] Other (See Comments)    Hallucinations      Social History   Socioeconomic History   Marital status: Significant Other    Spouse name: Not  on file   Number of children: 4   Years of education: Not on file   Highest education level: Not on file  Occupational History   Occupation: disability  Tobacco Use   Smoking status: Never    Passive exposure: Past   Smokeless tobacco: Never  Vaping Use   Vaping status: Never Used  Substance and Sexual Activity   Alcohol use: No   Drug use: No   Sexual activity: Not on file  Other Topics Concern   Not on file  Social History Narrative   FROM [place], [place]    Lives in a 2 story apartment.  His cousin is currently staying with him.  Has 4 children.  On disability for the past 10 years for pulmonary embolism, bipolar disease.  Education: high school.   Social Drivers of Corporate Investment Banker Strain: Not on file  Food Insecurity: Patient Declined (03/01/2023)   Hunger Vital Sign    Worried About Running Out of Food in the Last Year: Patient declined    Ran Out of Food in the Last Year: Patient declined  Transportation Needs: No Transportation Needs (03/01/2023)   PRAPARE - Administrator, Civil Service (Medical): No    Lack of Transportation (Non-Medical): No  Physical Activity: Not on file  Stress: Not on file  Social Connections: Unknown (07/31/2021)   Received from [facility], [facility]   Social Network    Social Network: Not on file  Intimate Partner Violence: Not At Risk (03/01/2023)   Humiliation, Afraid, Rape, and Kick questionnaire    Fear of Current or Ex-Partner: No    Emotionally Abused: No    Physically Abused: No    Sexually Abused: No    Physical Exam      Future Appointments  Date Time Provider Department Center  03/21/2023  3:20 PM [facility] DEVICE 1 [facility]  04/03/2023 11:00 AM [facility] PA/NP [facility] None  04/25/2023  2:30 PM [facility] None  04/25/2023  3:00 PM Federico Norleen ONEIDA MADISON, MD [facility] None  06/03/2023  7:20 AM [facility] DEVICE REMOTES [facility]  06/11/2023 10:00 AM  Lesia Ozell Barter, PA-C [facility]  09/02/2023  7:20 AM [facility] DEVICE REMOTES [facility]  12/02/2023  7:20 AM [facility] DEVICE REMOTES [facility]  03/02/2024  7:20 AM [facility] DEVICE REMOTES [facility]  06/01/2024  7:20 AM [facility] DEVICE REMOTES [facility]       Izetta Quivers, Paramedic [phone] [facility] Paramedic  03/19/23

## 2023-03-21 ENCOUNTER — Ambulatory Visit: Payer: 59 | Attending: Cardiovascular Disease

## 2023-03-21 ENCOUNTER — Telehealth: Payer: Self-pay | Admitting: Hematology and Oncology

## 2023-03-21 DIAGNOSIS — I428 Other cardiomyopathies: Secondary | ICD-10-CM | POA: Diagnosis not present

## 2023-03-21 LAB — CUP PACEART INCLINIC DEVICE CHECK
Date Time Interrogation Session: 20250102161421
Implantable Lead Connection Status: 753985
Implantable Lead Implant Date: 20241213
Implantable Lead Location: 753862
Implantable Lead Model: 3501
Implantable Lead Serial Number: 238967
Implantable Pulse Generator Implant Date: 20241213
Pulse Gen Serial Number: 316377

## 2023-03-21 NOTE — Telephone Encounter (Signed)
 Rescheduled appointments per provider on call. Patient is aware of changes made to the appointments.

## 2023-03-21 NOTE — Patient Instructions (Signed)
   After Your ICD (Implantable Cardiac Defibrillator)    Monitor your defibrillator site for redness, swelling, and drainage. Call the device clinic at [phone] if you experience these symptoms or fever/chills.  Your incision was closed with Steri-strips or staples:  You may shower 7 days after your procedure and wash your incision with soap and water. Avoid lotions, ointments, or perfumes over your incision until it is well-healed.  You may use a hot tub or a pool after your wound check appointment if the incision is completely closed.   There are no other restrictions in arm movement after your wound check appointment.  Your ICD is designed to protect you from life threatening heart rhythms. Because of this, you may receive a shock.   1 shock with no symptoms:  Call the office during business hours. 1 shock with symptoms (chest pain, chest pressure, dizziness, lightheadedness, shortness of breath, overall feeling unwell):  Call 911. If you experience 2 or more shocks in 24 hours:  Call 911. If you receive a shock, you should not drive.  [place] DMV - no driving for 6 months if you receive appropriate therapy from your ICD.   ICD Alerts:  Some alerts are vibratory and others beep. These are NOT emergencies. Please call our office to let us know. If this occurs at night or on weekends, it can wait until the next business day. Send a remote transmission.  If your device is capable of reading fluid status (for heart failure), you will be offered monthly monitoring to review this with you.   Remote monitoring is used to monitor your ICD from home. This monitoring is scheduled every 91 days by our office. It allows [place] to keep an eye on the functioning of your device to ensure it is working properly. You will routinely see your Electrophysiologist annually (more often if necessary).

## 2023-03-21 NOTE — Progress Notes (Addendum)
 Subcutaneous ICD check in clinic. 0 untreated episodes; 0 treated episodes; 0 shocks delivered. Electrode impedance status okay. No programming changes. Remaining longevity to ERI 100%.  S-ICD impedance 60 OHMS.  Wound check of implant sites performed.  Wound healing WNL, edges well approximated, no signs of redness, swelling or infection.  Patient given wound care and monitoring instructions, he has device clinic number if any concerns.   Note: patient has an implanted spinal TENS unit.  He will be transmitting weekly on Monday's per normal S-ICD transmission schedule.  Will monitor for any signs of interference and determine next steps.  This was reviewed with Joey, [facility] rep and this was his recommendation.

## 2023-03-26 ENCOUNTER — Other Ambulatory Visit (HOSPITAL_COMMUNITY): Payer: Self-pay

## 2023-03-26 NOTE — Progress Notes (Signed)
 Paramedicine Encounter    Patient ID: Justin [name], male    DOB: [birth year], 58 y.o.   MRN: [id number]   Complaints-none   Edema-none   Compliance with meds-yes  Pill box filled-yes  If so, by whom-pill box   Refills needed-mirtazapine , spiro----[facility]   Pt reports he is doing ok. He still getting used to his device. He went for a check up and all went well. No changes.  He denies any increased sob, no dizziness, no c/p.  Not weighing daily.  Pt reaching out to [facility] while I was here to verify his f/u appoint.  Meds verified and pill box refilled.    BP 118/84   Pulse 88   Resp 16   Wt 226 lb (102.5 kg)   SpO2 97%   BMI 33.37 kg/m  Weight yesterday--? Last visit weight-226  Patient Care Team: Pa, [facility] as PCP - General (Internal Medicine) Santo Stanly LABOR, MD as PCP - Cardiology (Cardiology) Kennyth Chew, MD as PCP - Electrophysiology (Cardiology)  Patient Active Problem List   Diagnosis Date Noted   ICD (implantable cardioverter-defibrillator) in place 03/01/2023   Painful orthopaedic hardware (HCC) 01/22/2023   Pain in right leg 08/06/2022   Fatigue 07/31/2022   History of DVT (deep vein thrombosis) 07/17/2022   Tremors of nervous system 07/17/2022   Nonischemic cardiomyopathy (HCC) 05/18/2022   HFrEF (heart failure with reduced ejection fraction) (HCC) 05/18/2022   Snoring 04/04/2022   Normocytic anemia 12/22/2021   History of pulmonary embolism 10/19/2021   Status post lumbar spinal fusion 01/03/2018   Lumbar stenosis 12/23/2017   Spondylolisthesis, lumbar region 06/25/2017   Achilles tendinitis of left lower extremity 12/07/2016   Achilles tendinitis, left leg 05/17/2016   Obstructive sleep apnea on CPAP 03/07/2015   Achilles rupture, left 10/21/2014   Bipolar disorder, unspecified (HCC) 01/07/2013   Headache 01/07/2013   Chronic anticoagulation 01/04/2013    Current Outpatient Medications:    carvedilol   (COREG ) 12.5 MG tablet, Take 1.5 tablets (18.75 mg total) by mouth 2 (two) times daily., Disp: 200 tablet, Rfl: 3   dabigatran  (PRADAXA ) 150 MG CAPS capsule, TAKE 1 CAPSULE(150 MG) BY MOUTH TWICE DAILY, Disp: 180 capsule, Rfl: 3   doxepin  (SINEQUAN ) 75 MG capsule, Take 75 mg by mouth at bedtime., Disp: , Rfl:    empagliflozin  (JARDIANCE ) 10 MG TABS tablet, Take 1 tablet (10 mg total) by mouth daily before breakfast., Disp: 30 tablet, Rfl: 11   hydrOXYzine  (ATARAX ) 25 MG tablet, Take 25 mg by mouth 3 (three) times daily as needed for anxiety., Disp: , Rfl:    mirtazapine  (REMERON ) 45 MG tablet, Take 45 mg by mouth at bedtime., Disp: , Rfl:    potassium chloride  SA (KLOR-CON  M) 20 MEQ tablet, Take 2 tablets (40 mEq total) by mouth daily. (Patient taking differently: Take 40 mEq by mouth daily.), Disp: 60 tablet, Rfl: 3   pravastatin  (PRAVACHOL ) 10 MG tablet, Take 1 tablet (10 mg total) by mouth at bedtime., Disp: 30 tablet, Rfl: 6   QUEtiapine  (SEROQUEL  XR) 400 MG 24 hr tablet, Take 800 mg by mouth at bedtime., Disp: , Rfl:    sacubitril -valsartan  (ENTRESTO ) 97-103 MG, Take 1 tablet by mouth 2 (two) times daily., Disp: 60 tablet, Rfl: 11   spironolactone  (ALDACTONE ) 25 MG tablet, Take 1 tablet (25 mg total) by mouth daily., Disp: 30 tablet, Rfl: 11   cyclobenzaprine  (FLEXERIL ) 10 MG tablet, Take 10 mg by mouth 3 (three) times  daily as needed for muscle spasms. (Patient not taking: Reported on 03/26/2023), Disp: , Rfl:    furosemide  (LASIX ) 40 MG tablet, Take 1 tablet (40 mg total) by mouth as needed for fluid or edema (take if a weight gain of 3 lbs within 24 hours or 5 lbs in a week.). (Patient not taking: Reported on 03/11/2023), Disp: 90 tablet, Rfl: 3 Allergies  Allergen Reactions   Lyrica [Pregabalin] Other (See Comments)    Hallucinations      Social History   Socioeconomic History   Marital status: Significant Other    Spouse name: Not on file   Number of children: 4   Years of  education: Not on file   Highest education level: Not on file  Occupational History   Occupation: disability  Tobacco Use   Smoking status: Never    Passive exposure: Past   Smokeless tobacco: Never  Vaping Use   Vaping status: Never Used  Substance and Sexual Activity   Alcohol use: No   Drug use: No   Sexual activity: Not on file  Other Topics Concern   Not on file  Social History Narrative   FROM [place], [place]    Lives in a 2 story apartment.  His cousin is currently staying with him.  Has 4 children.  On disability for the past 10 years for pulmonary embolism, bipolar disease.  Education: high school.   Social Drivers of Corporate Investment Banker Strain: Not on file  Food Insecurity: Patient Declined (03/01/2023)   Hunger Vital Sign    Worried About Running Out of Food in the Last Year: Patient declined    Ran Out of Food in the Last Year: Patient declined  Transportation Needs: No Transportation Needs (03/01/2023)   PRAPARE - Administrator, Civil Service (Medical): No    Lack of Transportation (Non-Medical): No  Physical Activity: Not on file  Stress: Not on file  Social Connections: Unknown (07/31/2021)   [facility], [facility]   Social Network    Social Network: Not on file  Intimate Partner Violence: Not At Risk (03/01/2023)   Humiliation, Afraid, Rape, and Kick questionnaire    Fear of Current or Ex-Partner: No    Emotionally Abused: No    Physically Abused: No    Sexually Abused: No    Physical Exam      Future Appointments  Date Time Provider Department Center  04/03/2023 11:00 AM [facility] PA/NP [facility] None  05/08/2023  2:30 PM [facility] None  05/08/2023  3:00 PM Federico Norleen ONEIDA MADISON, MD [facility] None  06/03/2023  7:20 AM [facility] DEVICE REMOTES [facility]  06/11/2023 10:00 AM Lesia Ozell Barter, PA-C [facility]  09/02/2023  7:20 AM [facility] DEVICE REMOTES  [facility]  12/02/2023  7:20 AM [facility] DEVICE REMOTES [facility]  03/02/2024  7:20 AM [facility] DEVICE REMOTES [facility]  06/01/2024  7:20 AM [facility] DEVICE REMOTES [facility]       Izetta Quivers, Paramedic [phone] [facility] Paramedic  03/26/23

## 2023-03-29 DIAGNOSIS — E559 Vitamin D deficiency, unspecified: Secondary | ICD-10-CM | POA: Diagnosis not present

## 2023-03-29 DIAGNOSIS — Z0000 Encounter for general adult medical examination without abnormal findings: Secondary | ICD-10-CM | POA: Diagnosis not present

## 2023-03-29 DIAGNOSIS — E7849 Other hyperlipidemia: Secondary | ICD-10-CM | POA: Diagnosis not present

## 2023-03-29 DIAGNOSIS — Z125 Encounter for screening for malignant neoplasm of prostate: Secondary | ICD-10-CM | POA: Diagnosis not present

## 2023-03-29 NOTE — Progress Notes (Signed)
 PCP: Pa, [facility] Cardiology: Dr. Felipe Horton HF Cardiology: Dr. Mitzie Anda  CC: HF follow up  Justin [name] is a 58 y.o. with history of PE/DVT, bipolar disorder, and chronic systolic CHF was referred by Dr. Felipe Horton for evaluation of CHF. Patient has been on anticoagulation long-term for his h/o PE/DVT.  He does not miss doses.  Suspected clotting disorder, being worked up by hematology for possible antiphospholipid antibody syndrome.  He had initial PE in 2009, Eliquis  started.  He had DVT on left while on Eliquis  and switched to coumadin . In 9/23, he had a spontaneous left thigh hematoma while INR supratherapeutic.  Warfarin was held and eventually switched to Pradaxa    Due to progressive dyspnea, echo was done in 1/24.  This showed EF 20-25%, severe LV dilation, normal RV, moderate-severe Justin. Patient had had coronary CTA in 12/23 with calcium score 49 and mild disease noted in LAD.    TEE in 3/24 showed EF 25-30%, moderate LV dilation, mildly decreased RV function, only mild Justin.  LHC/RHC in 3/24 showed mild nonobstructive CAD, normal filling pressures and preserved cardiac output.   Admitted 07/18/22 with tremors. Saw Pyschiatry. Lithium  was stopped.   Echo in 9/24 showed EF 30-35% with global hypokinesis, normal RV, no significant Justin.    Cardiac MRI in 11/24 showed LV EF 30% with mild LV dilation, RV EF 45%, subtle mid-wall LGE in the septum (nonspecific, seen with dilated cardiomyopathy), ECV 33%.   S/p sQ ICD placement 12/24.  Today he returns for HF follow up. Overall feeling fine. Main complaint is he gets "zapped" with static electricity everyday when he touches his charger or metal. This started since his ICD implantation. He has notified [employer] reps and EP. He is not SOB walking on flat ground, but he has occasional days where he is more SOB. Went back to work  yesterday and felt dizzy, no syncope. Denies palpitations, CP, edema, or PND/Orthopnea. Appetite ok. No fever or chills. Weight at home  225 pounds. Taking all medications. Wearing CPAP. Cares for son with Down Syndrome. Works in Investment banker, corporate at [employer].  ECG (personally reviewed): none ordered today.  Labs (12/23): Pro-BNP 547 Labs (2/24): K 4, creatinine 1.16, BNP 89 Labs (4/24): K 4, creatinine 1.38  Labs (8/24): K 4.2, creatinine 1.72 Labs (10/24): K 3.7, creatinine 1.21 Labs (12/24): K 4.4, creatinine 1.50  PMH: 1. HTN 2. OSA: Uses CPAP.   3. H/o DVT and PE: PE 2009 with Eliquis  started, DVT on left in 7/23 while on Eliquis , switched to warfarin.  Had large left thigh intramuscular hematoma with supratherapeutic INR.  Now on Pradaxa .  - Venous dopplers in 2/24 showed no DVT 4. Bipolar disorder: previously on Li.  5. H/o subdural hematoma 10/14 6. Chronic systolic CHF: Echo (1/24) with EF 20-25%, severe LV dilation, normal RV, moderate-severe Justin.  - Coronary CTA (12/23): Calcium score 49, mild stenosis in LAD.  - TEE (3/24): EF 25-30%, moderate LV dilation, mildly decreased RV function, only mild Justin. - LHC/RHC (3/24): Mild nonobstructive CAD, mean RA 2, PAP 35/17, mean PCWP 12, CI 2.69 - Echo (9/24): EF 30-35% with global hypokinesis, normal RV, no significant Justin.   - Cardiac MRI (11/24): LV EF 30% with mild LV dilation, RV EF 45%, subtle mid-wall LGE in the septum (nonspecific, seen with dilated cardiomyopathy), ECV 33%. - s/p sQ ICD placement 12/24 7. Spontaneous large left thigh intramuscular hematoma in setting of supratherapeutic INR on warfarin.  8. ABIs (2/24): normal 9. CKD stage  3  FH: No cardiac problems that he knows of.   Social History   Socioeconomic History   Marital status: Significant Other    Spouse name: Not on file   Number of children: 4   Years of education: Not on file   Highest education level: Not on file  Occupational History   Occupation: disability  Tobacco Use   Smoking status: Never    Passive exposure: Past   Smokeless tobacco: Never  Vaping Use   Vaping status: Never Used   Substance and Sexual Activity   Alcohol use: No   Drug use: No   Sexual activity: Not on file  Other Topics Concern   Not on file  Social History Narrative   FROM [place], [place]    Lives in a 2 story apartment.  His cousin is currently staying with him.  Has 4 children.  On disability for the past 10 years for pulmonary embolism, bipolar disease.  Education: high school.   Social Drivers of Corporate investment banker Strain: Not on file  Food Insecurity: Patient Declined (03/01/2023)   Hunger Vital Sign    Worried About Running Out of Food in the Last Year: Patient declined    Ran Out of Food in the Last Year: Patient declined  Transportation Needs: No Transportation Needs (03/01/2023)   PRAPARE - Administrator, Civil Service (Medical): No    Lack of Transportation (Non-Medical): No  Physical Activity: Not on file  Stress: Not on file  Social Connections: Unknown (07/31/2021)   Received [facility]   Social Network    Social Network: Not on file  Intimate Partner Violence: Not At Risk (03/01/2023)   Humiliation, Afraid, Rape, and Kick questionnaire    Fear of Current or Ex-Partner: No    Emotionally Abused: No    Physically Abused: No    Sexually Abused: No   ROS: All systems reviewed and negative except as per HPI.   Current Outpatient Medications  Medication Sig Dispense Refill   carvedilol  (COREG ) 12.5 MG tablet Take 1.5 tablets (18.75 mg total) by mouth 2 (two) times daily. 200 tablet 3   cephALEXin  (KEFLEX ) 500 MG capsule Take 1,000 mg by mouth 2 (two) times daily.     cyclobenzaprine  (FLEXERIL ) 10 MG tablet Take 10 mg by mouth 3 (three) times daily as needed for muscle spasms.     dabigatran  (PRADAXA ) 150 MG CAPS capsule Take 1 capsule (150 mg total) by mouth 2 (two) times daily. 180 capsule 3   doxepin  (SINEQUAN ) 75 MG capsule Take 75 mg by mouth at bedtime.     empagliflozin  (JARDIANCE ) 10 MG TABS tablet Take 1 tablet (10 mg total)  by mouth daily before breakfast. 30 tablet 11   furosemide  (LASIX ) 40 MG tablet Take 1 tablet (40 mg total) by mouth as needed for fluid or edema (take if a weight gain of 3 lbs within 24 hours or 5 lbs in a week.). 90 tablet 3   hydrOXYzine  (ATARAX ) 25 MG tablet Take 25 mg by mouth 3 (three) times daily as needed for anxiety.     mirtazapine  (REMERON ) 45 MG tablet Take 45 mg by mouth at bedtime.     potassium chloride  SA (KLOR-CON  M) 20 MEQ tablet Take 2 tablets (40 mEq total) by mouth daily. 60 tablet 3   pravastatin  (PRAVACHOL ) 10 MG tablet Take 1 tablet (10 mg total) by mouth at bedtime. 30 tablet 11   QUEtiapine  (SEROQUEL   XR) 400 MG 24 hr tablet Take 800 mg by mouth at bedtime.     sacubitril -valsartan  (ENTRESTO ) 97-103 MG Take 1 tablet by mouth 2 (two) times daily. 60 tablet 11   spironolactone  (ALDACTONE ) 25 MG tablet Take 1 tablet (25 mg total) by mouth daily. 30 tablet 11   No current facility-administered medications for this encounter.   BP 102/70   Pulse 80   Wt 105.6 kg (232 lb 12.8 oz)   SpO2 95%   BMI 34.38 kg/m   Wt Readings from Last 3 Encounters:  04/03/23 105.6 kg (232 lb 12.8 oz)  04/01/23 102.1 kg (225 lb)  03/26/23 102.5 kg (226 lb)   Physical Exam General:  NAD. No resp difficulty, walked into clinic HEENT: Normal Neck: Supple. No JVD. Carotids 2+ bilat; no bruits. No lymphadenopathy or thryomegaly appreciated. Cor: PMI nondisplaced. Regular rate & rhythm. No rubs, gallops or murmurs. Lungs: Clear Abdomen: Soft, nontender, nondistended. No hepatosplenomegaly. No bruits or masses. Good bowel sounds. Extremities: No cyanosis, clubbing, rash, edema Neuro: Alert & oriented x 3, cranial nerves grossly intact. Moves all 4 extremities w/o difficulty. Affect pleasant.  Assessment/plan: 1. Venous thromboembolism: PE in 2009, DVT in 7/23 while on Eliquis .  Switched to warfarin and had spontaneous left thigh hematoma while INR supratherapeutic on warfarin. LE dopplers  (4/24) negative for DVT. Now on Pradaxa .   - Continue Pradaxa .  2. Chronic systolic CHF: Nonischemic cardiomyopathy.  Echo (1/24) with EF 20-25%, severe LV dilation, normal RV, moderate-severe Justin. TEE in 3/24 showed EF 25-30%, moderate LV dilation, mildly decreased RV function, only mild Justin.  LHC/RHC in 3/24 with mild nonobstructive CAD, normal filling pressures, preserved cardiac output.  Echo in 9/24 showed EF 30-35% with global hypokinesis, normal RV, no significant Justin.  Cardiac MRI in 11/24 showed  LV EF 30% with mild LV dilation, RV EF 45%, subtle mid-wall LGE in the septum (nonspecific, seen with dilated cardiomyopathy), ECV 33%.   He denies history of ETOH or drugs, no family history of cardiomyopathy or SCD.  He has a history of HTN. Now s/p SQ ICD placement (12/24): NYHA class II, not volume overloaded on exam. - Continue Entresto  97/103 mg bid. BMET/BNP today.  - Continue Jardiance  10 mg daily. No GU symptoms. - Continue spironolactone  25 mg daily.  - Continue Coreg  18.75 mg bid.   - He has Lasix  for prn use, has not used.  3. Mitral regurgitation: Moderate-severe on 1/24 echo.  However, TEE in 3/24 showed only mild Justin. Echo in 9/24 showed minimal Justin.  4. HTN: BP controlled.   5. OSA: Using CPAP.   Follow up in 3-4 months with Dr. Mitzie Anda. I asked him to notify [employer] reps about static shocks (? Interference with TENS unit)  Arlice Bene [facility] FNP-BC 04/03/2023

## 2023-04-01 ENCOUNTER — Other Ambulatory Visit (HOSPITAL_COMMUNITY): Payer: Self-pay

## 2023-04-01 NOTE — Progress Notes (Signed)
Paramedicine Encounter    Patient ID: Justin [name], male    DOB: 02-Aug-1965, 58 y.o.   MRN: [id number]   Complaints-tenderness to device   Edema-none  Compliance with meds-yes  Pill box filled-yes  If so, by whom-paramedic  Refills needed-entresto , jardiance , doxepin , quetiapine , mirtazepine   Pt reports he is doing ok, he went to see PCP last week, Dr. Shelda was concerned his device may be infected and he prescribed antibiotic. He did start taking them already.  I did relay this info to his cardiology office that placed this device.  It didn't seem more swollen than the last time I seen him and there was no drainage or issues at the incision site.  Pt denies increased sob, no dizziness, no c/p. Weight good. Denies bloated. Appetite good. His weight has actually increased from a few months ago due to improved appetite.  Meds verified and pill box refilled.   [facility] will deliver meds listed above.    BP 106/70   Pulse 74   Resp 16   Wt 225 lb (102.1 kg)   SpO2 100%   BMI 33.23 kg/m  Weight yesterday-? Last visit weight-226  Patient Care Team: Pa, [facility] as PCP - General (Internal Medicine) Santo Stanly LABOR, MD as PCP - Cardiology (Cardiology) Kennyth Chew, MD as PCP - Electrophysiology (Cardiology)  Patient Active Problem List   Diagnosis Date Noted   ICD (implantable cardioverter-defibrillator) in place 03/01/2023   Painful orthopaedic hardware (HCC) 01/22/2023   Pain in right leg 08/06/2022   Fatigue 07/31/2022   History of DVT (deep vein thrombosis) 07/17/2022   Tremors of nervous system 07/17/2022   Nonischemic cardiomyopathy (HCC) 05/18/2022   HFrEF (heart failure with reduced ejection fraction) (HCC) 05/18/2022   Snoring 04/04/2022   Normocytic anemia 12/22/2021   History of pulmonary embolism 10/19/2021   Status post lumbar spinal fusion 01/03/2018   Lumbar stenosis 12/23/2017   Spondylolisthesis, lumbar region 06/25/2017    Achilles tendinitis of left lower extremity 12/07/2016   Achilles tendinitis, left leg 05/17/2016   Obstructive sleep apnea on CPAP 03/07/2015   Achilles rupture, left 10/21/2014   Bipolar disorder, unspecified (HCC) 01/07/2013   Headache 01/07/2013   Chronic anticoagulation 01/04/2013    Current Outpatient Medications:    cephALEXin  (KEFLEX ) 500 MG capsule, Take 1,000 mg by mouth 2 (two) times daily., Disp: , Rfl:    doxepin  (SINEQUAN ) 75 MG capsule, Take 75 mg by mouth at bedtime., Disp: , Rfl:    mirtazapine  (REMERON ) 45 MG tablet, Take 45 mg by mouth at bedtime., Disp: , Rfl:    QUEtiapine  (SEROQUEL  XR) 400 MG 24 hr tablet, Take 800 mg by mouth at bedtime., Disp: , Rfl:    carvedilol  (COREG ) 12.5 MG tablet, Take 1.5 tablets (18.75 mg total) by mouth 2 (two) times daily., Disp: 200 tablet, Rfl: 3   cyclobenzaprine  (FLEXERIL ) 10 MG tablet, Take 10 mg by mouth 3 (three) times daily as needed for muscle spasms. (Patient not taking: Reported on 03/26/2023), Disp: , Rfl:    dabigatran  (PRADAXA ) 150 MG CAPS capsule, Take 1 capsule (150 mg total) by mouth 2 (two) times daily. (Patient not taking: Reported on 04/02/2023), Disp: 180 capsule, Rfl: 3   empagliflozin  (JARDIANCE ) 10 MG TABS tablet, Take 1 tablet (10 mg total) by mouth daily before breakfast., Disp: 30 tablet, Rfl: 11   furosemide  (LASIX ) 40 MG tablet, Take 1 tablet (40 mg total) by mouth as needed for fluid or edema (take if  a weight gain of 3 lbs within 24 hours or 5 lbs in a week.). (Patient not taking: Reported on 04/02/2023), Disp: 90 tablet, Rfl: 3   hydrOXYzine  (ATARAX ) 25 MG tablet, Take 25 mg by mouth 3 (three) times daily as needed for anxiety. (Patient not taking: Reported on 04/02/2023), Disp: , Rfl:    potassium chloride  SA (KLOR-CON  M) 20 MEQ tablet, Take 2 tablets (40 mEq total) by mouth daily., Disp: 60 tablet, Rfl: 3   pravastatin  (PRAVACHOL ) 10 MG tablet, Take 1 tablet (10 mg total) by mouth at bedtime., Disp: 30 tablet, Rfl:  11   sacubitril -valsartan  (ENTRESTO ) 97-103 MG, Take 1 tablet by mouth 2 (two) times daily., Disp: 60 tablet, Rfl: 11   spironolactone  (ALDACTONE ) 25 MG tablet, Take 1 tablet (25 mg total) by mouth daily., Disp: 30 tablet, Rfl: 11 Allergies  Allergen Reactions   Lyrica [Pregabalin] Other (See Comments)    Hallucinations      Social History   Socioeconomic History   Marital status: Significant Other    Spouse name: Not on file   Number of children: 4   Years of education: Not on file   Highest education level: Not on file  Occupational History   Occupation: disability  Tobacco Use   Smoking status: Never    Passive exposure: Past   Smokeless tobacco: Never  Vaping Use   Vaping status: Never Used  Substance and Sexual Activity   Alcohol use: No   Drug use: No   Sexual activity: Not on file  Other Topics Concern   Not on file  Social History Narrative   FROM [place], [place]    Lives in a 2 story apartment.  His cousin is currently staying with him.  Has 4 children.  On disability for the past 10 years for pulmonary embolism, bipolar disease.  Education: high school.   Social Drivers of Corporate Investment Banker Strain: Not on file  Food Insecurity: Patient Declined (03/01/2023)   Hunger Vital Sign    Worried About Running Out of Food in the Last Year: Patient declined    Ran Out of Food in the Last Year: Patient declined  Transportation Needs: No Transportation Needs (03/01/2023)   PRAPARE - Administrator, Civil Service (Medical): No    Lack of Transportation (Non-Medical): No  Physical Activity: Not on file  Stress: Not on file  Social Connections: Unknown (07/31/2021)   Received from [facility]   Social Network    Social Network: Not on file  Intimate Partner Violence: Not At Risk (03/01/2023)   Humiliation, Afraid, Rape, and Kick questionnaire    Fear of Current or Ex-Partner: No    Emotionally Abused: No    Physically Abused: No     Sexually Abused: No    Physical Exam      Future Appointments  Date Time Provider Department Center  04/03/2023 11:00 AM [facility] PA/NP [facility] None  05/08/2023  2:30 PM [facility] LAB [facility] None  05/08/2023  3:00 PM Federico Norleen ONEIDA MADISON, MD [facility] None  06/03/2023  7:20 AM [facility] DEVICE REMOTES [facility]  06/11/2023 10:00 AM Lesia Ozell Barter, PA-C [facility]  09/02/2023  7:20 AM [facility] DEVICE REMOTES [facility]  12/02/2023  7:20 AM [facility] DEVICE REMOTES [facility]  03/02/2024  7:20 AM [facility] DEVICE REMOTES [facility]  06/01/2024  7:20 AM [facility] DEVICE REMOTES [facility]       Izetta Quivers, Paramedic [phone] [facility] Paramedic  04/02/23 

## 2023-04-02 ENCOUNTER — Telehealth (HOSPITAL_COMMUNITY): Payer: Self-pay | Admitting: Cardiology

## 2023-04-02 MED ORDER — PRAVASTATIN SODIUM 10 MG PO TABS: 10.0000 mg | ORAL_TABLET | Freq: Every day | ORAL | 11 refills | Status: DC

## 2023-04-02 MED ORDER — POTASSIUM CHLORIDE CRYS ER 20 MEQ PO TBCR: 40.0000 meq | EXTENDED_RELEASE_TABLET | Freq: Every day | ORAL | 3 refills | Status: DC

## 2023-04-02 MED ORDER — SPIRONOLACTONE 25 MG PO TABS: 25.0000 mg | ORAL_TABLET | Freq: Every day | ORAL | 11 refills | Status: DC

## 2023-04-02 MED ORDER — DABIGATRAN ETEXILATE MESYLATE 150 MG PO CAPS: 150.0000 mg | ORAL_CAPSULE | Freq: Two times a day (BID) | ORAL | 3 refills | Status: DC

## 2023-04-02 MED ORDER — FUROSEMIDE 40 MG PO TABS: 40.0000 mg | ORAL_TABLET | ORAL | 3 refills | Status: DC | PRN

## 2023-04-02 MED ORDER — EMPAGLIFLOZIN 10 MG PO TABS: 10.0000 mg | ORAL_TABLET | Freq: Every day | ORAL | 11 refills | Status: DC

## 2023-04-02 MED ORDER — CARVEDILOL 12.5 MG PO TABS: 18.7500 mg | ORAL_TABLET | Freq: Two times a day (BID) | ORAL | 3 refills | Status: DC

## 2023-04-02 MED ORDER — ENTRESTO 97-103 MG PO TABS: 1.0000 | ORAL_TABLET | Freq: Two times a day (BID) | ORAL | 11 refills | Status: DC

## 2023-04-02 NOTE — Telephone Encounter (Signed)
-----   Message from [facility] Katie L sent at 04/01/2023 10:13 AM EST ----- Regarding: rx's We are switching pharmacies and moving to [facility]-  Can you send spiro over with new rx?  If you notice any other heart meds that dont have refills will you send those along too?    Thanks,  Izetta Quivers, EMT-Paramedic  [phone] 04/01/2023

## 2023-04-02 NOTE — Telephone Encounter (Signed)
 Rx sent

## 2023-04-03 ENCOUNTER — Telehealth: Payer: Self-pay | Admitting: Cardiology

## 2023-04-03 ENCOUNTER — Ambulatory Visit (HOSPITAL_COMMUNITY)
Admission: RE | Admit: 2023-04-03 | Discharge: 2023-04-03 | Disposition: A | Discharge status: Home / Self Care | Payer: 59 | Source: Ambulatory Visit | Attending: Family Medicine | Admitting: Family Medicine

## 2023-04-03 ENCOUNTER — Other Ambulatory Visit (HOSPITAL_COMMUNITY): Payer: Self-pay

## 2023-04-03 ENCOUNTER — Encounter (HOSPITAL_COMMUNITY): Payer: Self-pay

## 2023-04-03 VITALS — BP 102/70 | HR 80 | Wt 232.8 lb

## 2023-04-03 DIAGNOSIS — G4733 Obstructive sleep apnea (adult) (pediatric): Secondary | ICD-10-CM | POA: Diagnosis not present

## 2023-04-03 DIAGNOSIS — Z86711 Personal history of pulmonary embolism: Secondary | ICD-10-CM | POA: Insufficient documentation

## 2023-04-03 DIAGNOSIS — Z7902 Long term (current) use of antithrombotics/antiplatelets: Secondary | ICD-10-CM | POA: Insufficient documentation

## 2023-04-03 DIAGNOSIS — Z95810 Presence of automatic (implantable) cardiac defibrillator: Secondary | ICD-10-CM | POA: Insufficient documentation

## 2023-04-03 DIAGNOSIS — I420 Dilated cardiomyopathy: Secondary | ICD-10-CM | POA: Diagnosis not present

## 2023-04-03 DIAGNOSIS — R42 Dizziness and giddiness: Secondary | ICD-10-CM | POA: Insufficient documentation

## 2023-04-03 DIAGNOSIS — Z79899 Other long term (current) drug therapy: Secondary | ICD-10-CM | POA: Insufficient documentation

## 2023-04-03 DIAGNOSIS — N183 Chronic kidney disease, stage 3 unspecified: Secondary | ICD-10-CM | POA: Insufficient documentation

## 2023-04-03 DIAGNOSIS — Z86718 Personal history of other venous thrombosis and embolism: Secondary | ICD-10-CM

## 2023-04-03 DIAGNOSIS — Z7984 Long term (current) use of oral hypoglycemic drugs: Secondary | ICD-10-CM | POA: Insufficient documentation

## 2023-04-03 DIAGNOSIS — I340 Nonrheumatic mitral (valve) insufficiency: Secondary | ICD-10-CM | POA: Diagnosis not present

## 2023-04-03 DIAGNOSIS — I5022 Chronic systolic (congestive) heart failure: Secondary | ICD-10-CM | POA: Diagnosis not present

## 2023-04-03 DIAGNOSIS — I130 Hypertensive heart and chronic kidney disease with heart failure and stage 1 through stage 4 chronic kidney disease, or unspecified chronic kidney disease: Secondary | ICD-10-CM | POA: Diagnosis not present

## 2023-04-03 DIAGNOSIS — I10 Essential (primary) hypertension: Secondary | ICD-10-CM

## 2023-04-03 LAB — BASIC METABOLIC PANEL
Anion gap: 5 (ref 5–15)
BUN: 16 mg/dL (ref 6–20)
CO2: 26 mmol/L (ref 22–32)
Calcium: 9 mg/dL (ref 8.9–10.3)
Chloride: 110 mmol/L (ref 98–111)
Creatinine, Ser: 1.57 mg/dL — ABNORMAL HIGH (ref 0.61–1.24)
GFR, Estimated: 50 mL/min — ABNORMAL LOW (ref >60)
Glucose, Bld: 119 mg/dL — ABNORMAL HIGH (ref 70–99)
Potassium: 4.4 mmol/L (ref 3.5–5.1)
Sodium: 141 mmol/L (ref 135–145)

## 2023-04-03 LAB — BRAIN NATRIURETIC PEPTIDE: B Natriuretic Peptide: 30.5 pg/mL (ref 0.0–100.0)

## 2023-04-03 NOTE — Patient Instructions (Addendum)
 Thank you for coming in today  If you had labs drawn today, any labs that are abnormal the clinic will call you No news is good news  Medications: No changes  Follow up appointments:  Your physician recommends that you schedule a follow-up appointment in:  3-4 months With Dr. Mclean  You will receive a reminder letter in the mail a few months in advance. If you don't receive a letter, please call our office to schedule the follow-up appointment. Please call in February/March 2025 for April/May appointment   Do the following things EVERYDAY: Weigh yourself in the morning before breakfast. Write it down and keep it in a log. Take your medicines as prescribed Eat low salt foods--Limit salt (sodium) to 2000 mg per day.  Stay as active as you can everyday Limit all fluids for the day to less than 2 liters   At the [facility], you and your health needs are our priority. As part of our continuing mission to provide you with exceptional heart care, we have created designated Provider Care Teams. These Care Teams include your primary Cardiologist (physician) and Advanced Practice Providers (APPs- Physician Assistants and Nurse Practitioners) who all work together to provide you with the care you need, when you need it.   You may see any of the following providers on your designated Care Team at your next follow up: Dr Jules Oar Dr Peder Bourdon Dr. Mimi Alt, NP Ruddy Corral, [place] [facility], [place] Dennise Fitz, NP Luster Salters, PharmD   Please be sure to bring in all your medications bottles to every appointment.    Thank you for choosing [facility]  If you have any questions or concerns before your next appointment please send us  a message through [place] or call our office at [phone].    TO LEAVE A MESSAGE FOR THE NURSE SELECT OPTION 2, PLEASE LEAVE A MESSAGE  INCLUDING: YOUR NAME DATE OF BIRTH CALL BACK NUMBER REASON FOR CALL**this is important as we prioritize the call backs  YOU WILL RECEIVE A CALL BACK THE SAME DAY AS LONG AS YOU CALL BEFORE 4:00 PM

## 2023-04-03 NOTE — Telephone Encounter (Signed)
  1. Has your device fired? Yes- he says he keeps getting zapped  2. Is you device beeping?   3. Are you experiencing draining or swelling at device site?   4. Are you calling to see if we received your device transmission?   5. Have you passed out?    Please route to Device Clinic Pool

## 2023-04-03 NOTE — Progress Notes (Signed)
 Paramedicine Encounter   Patient ID: Justin [name] , male,   DOB: 10/05/1965,57 y.o.,  MRN: [id number]   Met patient in clinic today with provider.  Weight @ clinic-232 B/P-102/70 P-80 SP02-95  Med changes-  Pt went back to work yesterday. He did get light headed but was doing a lot at work.   He has complained recently about static in his house when he touches charger cord for phone and metal items, Camilo Cella referred him to speaking to his EP or the rep of his device about that, but doesn't see any interference between the devices.  I went ahead and topped off his pill box.  Winter weather is potential for next week.  I am going to take his meds to pharmacy to start the bubble packs.     Alfonza Angry, EMT-Paramedic [phone] 04/03/2023

## 2023-04-03 NOTE — Telephone Encounter (Signed)
 Patient reports of feeling static when he touches a door, light switch, cord, etc. For approx. 1 week. Pt Remote transmission received 04/01/23 and shows normal device function. Spoke to [employer] with [employer] who advises he is not aware of the device causing static electricity.   Patient does have a spinal cord stimulator. Advised patient to contact MD office & company to inquire further and let them know he has an ICD that was placed more recently. Pt voiced understanding and will call back with follow up.

## 2023-04-04 ENCOUNTER — Telehealth: Payer: Self-pay

## 2023-04-04 NOTE — Telephone Encounter (Signed)
-----   Message from Nurse Richarda Osmond sent at 04/03/2023  7:08 AM EST ----- Regarding: FW: f/u  ----- Message ----- From: Kerry Hough, Paramedic Sent: 04/02/2023  10:51 AM EST To: Wiliam Ke, RN Subject: f/u                                            Good morning,  I just wanted to pass along that he seen his PCP last week and that provider thought maybe his device may be infected b/c it was swollen and placed him on keflex.  I didn't think it was more swollen than the last time I seen him and when he was seen in your office.  There is no drainage or anything at the incision site.  But figured you may want to know and to see if you need to see him back sooner than his next scheduled appointment?   He does see dr Shirlee Latch tomor also.    Kerry Hough, EMT-Paramedic  [phone] 04/02/2023

## 2023-04-04 NOTE — Telephone Encounter (Signed)
Spoke to pt who advises no redness, swelling or drainage noted at this time. States it is painful to lay on his left side. Explained to patient this is to be expected as this device is newly implanted and the device itself is large in size. Advised he needs to refrain from laying on left side for sometime. Device clinic apt made for next available apt 04/10/23 @ 8:40 am. Location, date and time discussed with patient. Patient advised to call if he has further questions or concerns between now and his wound check.

## 2023-04-08 DIAGNOSIS — G4733 Obstructive sleep apnea (adult) (pediatric): Secondary | ICD-10-CM | POA: Diagnosis not present

## 2023-04-10 ENCOUNTER — Other Ambulatory Visit (HOSPITAL_COMMUNITY): Payer: Self-pay

## 2023-04-10 ENCOUNTER — Ambulatory Visit: Payer: MEDICAID | Attending: Cardiology

## 2023-04-10 DIAGNOSIS — I5022 Chronic systolic (congestive) heart failure: Secondary | ICD-10-CM

## 2023-04-10 NOTE — Patient Instructions (Signed)
Follow up as scheduled.  

## 2023-04-10 NOTE — Progress Notes (Signed)
Pt seen in device clinic to evaluate incision site for potential infection.  Device site healing WNL.  Incision is well healed.  No open areas, redness, swelling or drainage.  Pt states the area continues to be a little sore.  Advised this was normal with this type of implant and this should continue to improve.    Pt main complaint is regarding static shocks.  Per Pt he called [employer] to report static shocks and was advised to turn off his spinal stimulator for 1 week to evaluate for improvement.  Per Pt he is not sure it has made a difference.  He does notice that he has had increased back pain and would like to resume therapy with  his spinal stimulator.  Advised this nurse would reach out to [facility] for further clarification.    Outreach made to [employer].  Technical services believes that the increased static electricity shocks are related to cold weather and low humidity rather than a recent ICD implant.  Returned call to Pt.  Advised of above.  He will turn his spinal stimulator back on.  Advised to call back with further needs.

## 2023-04-10 NOTE — Progress Notes (Signed)
Paramedicine Encounter    Patient ID: Justin [name], male    DOB: 01/28/1966, 59 y.o.   MRN: [id number]   Complaints-none  Edema-none  Compliance with meds-yes   Pill box filled-started bubble packs   Refills needed-none      Pt reports he is doing ok.  He denies increased sob, he is back at work. He does have some times he gets dizzy but is able to take breaks when needed. No issues with that with employer. Advised him to take it easy and slow.  No c/p. He still has tenderness to device area. Going for f/u on that today.  No bleeding or drainage to the incision area.   I p/u his bubble packs from pharmacy and brought to him. I verified meds were correct per med list and they were.  I explained to him how to use and he seemed to understand easily.  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 But will take it a visit at a time.   BP 118/70   Pulse 84   Resp 16   Wt 230 lb (104.3 kg)   SpO2 99%   BMI 33.97 kg/m  Weight yesterday-? Last visit weight-232  Patient Care Team: Pa, [facility] as PCP - General (Internal Medicine) Christell Constant, MD as PCP - Cardiology (Cardiology) Nobie Putnam, MD as PCP - Electrophysiology (Cardiology)  Patient Active Problem List   Diagnosis Date Noted   ICD (implantable cardioverter-defibrillator) in place 03/01/2023   Painful orthopaedic hardware (HCC) 01/22/2023   Pain in right leg 08/06/2022   Fatigue 07/31/2022   History of DVT (deep vein thrombosis) 07/17/2022   Tremors of nervous system 07/17/2022   Nonischemic cardiomyopathy (HCC) 05/18/2022   HFrEF (heart failure with reduced ejection fraction) (HCC) 05/18/2022   Snoring 04/04/2022   Normocytic anemia 12/22/2021   History of pulmonary embolism 10/19/2021   Status post lumbar spinal fusion 01/03/2018   Lumbar stenosis 12/23/2017   Spondylolisthesis, lumbar region 06/25/2017   Achilles  tendinitis of left lower extremity 12/07/2016   Achilles tendinitis, left leg 05/17/2016   Obstructive sleep apnea on CPAP 03/07/2015   Achilles rupture, left 10/21/2014   Bipolar disorder, unspecified (HCC) 01/07/2013   Headache 01/07/2013   Chronic anticoagulation 01/04/2013    Current Outpatient Medications:    carvedilol (COREG) 12.5 MG tablet, Take 1.5 tablets (18.75 mg total) by mouth 2 (two) times daily., Disp: 200 tablet, Rfl: 3   cephALEXin (KEFLEX) 500 MG capsule, Take 1,000 mg by mouth 2 (two) times daily., Disp: , Rfl:    dabigatran (PRADAXA) 150 MG CAPS capsule, Take 1 capsule (150 mg total) by mouth 2 (two) times daily., Disp: 180 capsule, Rfl: 3   doxepin (SINEQUAN) 75 MG capsule, Take 75 mg by mouth at bedtime., Disp: , Rfl:    empagliflozin (JARDIANCE) 10 MG TABS tablet, Take 1 tablet (10 mg total) by mouth daily before breakfast., Disp: 30 tablet, Rfl: 11   furosemide (LASIX) 40 MG tablet, Take 1 tablet (40 mg total) by mouth as needed for fluid or edema (take if a weight gain of 3 lbs within 24 hours or 5 lbs in a week.)., Disp: 90 tablet, Rfl: 3   hydrOXYzine (ATARAX) 25 MG tablet, Take 25 mg by mouth 3 (three) times daily as needed for anxiety., Disp: , Rfl:    mirtazapine (REMERON) 45 MG tablet, Take 45 mg by  mouth at bedtime., Disp: , Rfl:    potassium chloride SA (KLOR-CON M) 20 MEQ tablet, Take 2 tablets (40 mEq total) by mouth daily. (Patient taking differently: Take 40 mEq by mouth daily. Patient takes 1 tablet by mouth in the morning and evening.), Disp: 60 tablet, Rfl: 3   pravastatin (PRAVACHOL) 10 MG tablet, Take 1 tablet (10 mg total) by mouth at bedtime., Disp: 30 tablet, Rfl: 11   QUEtiapine (SEROQUEL XR) 400 MG 24 hr tablet, Take 800 mg by mouth at bedtime., Disp: , Rfl:    sacubitril-valsartan (ENTRESTO) 97-103 MG, Take 1 tablet by mouth 2 (two) times daily., Disp: 60 tablet, Rfl: 11   spironolactone (ALDACTONE) 25 MG tablet, Take 1 tablet (25 mg total) by  mouth daily., Disp: 30 tablet, Rfl: 11 Allergies  Allergen Reactions   Lyrica [Pregabalin] Other (See Comments)    Hallucinations      Social History   Socioeconomic History   Marital status: Significant Other    Spouse name: Not on file   Number of children: 4   Years of education: Not on file   Highest education level: Not on file  Occupational History   Occupation: disability  Tobacco Use   Smoking status: Never    Passive exposure: Past   Smokeless tobacco: Never  Vaping Use   Vaping status: Never Used  Substance and Sexual Activity   Alcohol use: No   Drug use: No   Sexual activity: Not on file  Other Topics Concern   Not on file  Social History Narrative   FROM [place]    Lives in a 2 story apartment.  His cousin is currently staying with him.  Has 4 children.  On disability for the past 10 years for pulmonary embolism, bipolar disease.  Education: high school.   Social Drivers of Corporate investment banker Strain: Not on file  Food Insecurity: Patient Declined (03/01/2023)   Hunger Vital Sign    Worried About Running Out of Food in the Last Year: Patient declined    Ran Out of Food in the Last Year: Patient declined  Transportation Needs: No Transportation Needs (03/01/2023)   PRAPARE - Administrator, Civil Service (Medical): No    Lack of Transportation (Non-Medical): No  Physical Activity: Not on file  Stress: Not on file  Social Connections: Unknown (07/31/2021)   Received from [facility], [facility]   Social Network    Social Network: Not on file  Intimate Partner Violence: Not At Risk (03/01/2023)   Humiliation, Afraid, Rape, and Kick questionnaire    Fear of Current or Ex-Partner: No    Emotionally Abused: No    Physically Abused: No    Sexually Abused: No    Physical Exam      Future Appointments  Date Time Provider Department Center  05/08/2023  2:30 PM [facility] None  05/08/2023  3:00 PM  Jaci Standard, MD [facility] None  06/03/2023  7:20 AM [facility] DEVICE REMOTES [facility]  06/11/2023 10:00 AM Graciella Freer, PA-C [facility]  09/02/2023  7:20 AM [facility] DEVICE REMOTES [facility]  12/02/2023  7:20 AM [facility] DEVICE REMOTES [facility]  03/02/2024  7:20 AM [facility] DEVICE REMOTES [facility]  06/01/2024  7:20 AM [facility] DEVICE REMOTES [facility]       Kerry Hough, Paramedic [phone] [facility] Paramedic  04/10/23

## 2023-04-25 ENCOUNTER — Other Ambulatory Visit: Payer: MEDICAID

## 2023-04-25 ENCOUNTER — Ambulatory Visit: Payer: MEDICAID | Admitting: Hematology and Oncology

## 2023-04-25 ENCOUNTER — Other Ambulatory Visit (HOSPITAL_COMMUNITY): Payer: Self-pay

## 2023-04-25 NOTE — Progress Notes (Signed)
 Paramedicine Encounter    Patient ID: Justin [name], male    DOB: 11/14/1965, 58 y.o.   MRN: [id number]   Complaints-none  Edema-none   Compliance with meds-yes   Pill box filled-on bubble packs now  If so, by whom-n/a   Refills needed-2 more wks of meds left   Pt reports he is doing well. He denies increased sob, no dizziness, no c/p. No edema noted. He reports he is loving the bubble packs and easy to use.   I will see him in another couple wks and if things are continuing to move in forward direction then will discuss d/c from paramedicine but will review at that time.   BP 102/70   Pulse 86   Resp 16   Wt 225 lb (102.1 kg)   SpO2 98%   BMI 33.23 kg/m  Weight yesterday-? Last visit weight-230  Patient Care Team: Pa, [facility] as PCP - General (Internal Medicine) Santo Stanly LABOR, MD as PCP - Cardiology (Cardiology) Kennyth Chew, MD as PCP - Electrophysiology (Cardiology)  Patient Active Problem List   Diagnosis Date Noted   ICD (implantable cardioverter-defibrillator) in place 03/01/2023   Painful orthopaedic hardware (HCC) 01/22/2023   Pain in right leg 08/06/2022   Fatigue 07/31/2022   History of DVT (deep vein thrombosis) 07/17/2022   Tremors of nervous system 07/17/2022   Nonischemic cardiomyopathy (HCC) 05/18/2022   HFrEF (heart failure with reduced ejection fraction) (HCC) 05/18/2022   Snoring 04/04/2022   Normocytic anemia 12/22/2021   History of pulmonary embolism 10/19/2021   Status post lumbar spinal fusion 01/03/2018   Lumbar stenosis 12/23/2017   Spondylolisthesis, lumbar region 06/25/2017   Achilles tendinitis of left lower extremity 12/07/2016   Achilles tendinitis, left leg 05/17/2016   Obstructive sleep apnea on CPAP 03/07/2015   Achilles rupture, left 10/21/2014   Bipolar disorder, unspecified (HCC) 01/07/2013   Headache 01/07/2013   Chronic anticoagulation 01/04/2013    Current Outpatient Medications:    carvedilol  (COREG )  12.5 MG tablet, Take 1.5 tablets (18.75 mg total) by mouth 2 (two) times daily., Disp: 200 tablet, Rfl: 3   cephALEXin  (KEFLEX ) 500 MG capsule, Take 1,000 mg by mouth 2 (two) times daily., Disp: , Rfl:    dabigatran  (PRADAXA ) 150 MG CAPS capsule, Take 1 capsule (150 mg total) by mouth 2 (two) times daily., Disp: 180 capsule, Rfl: 3   doxepin  (SINEQUAN ) 75 MG capsule, Take 75 mg by mouth at bedtime., Disp: , Rfl:    empagliflozin  (JARDIANCE ) 10 MG TABS tablet, Take 1 tablet (10 mg total) by mouth daily before breakfast., Disp: 30 tablet, Rfl: 11   furosemide  (LASIX ) 40 MG tablet, Take 1 tablet (40 mg total) by mouth as needed for fluid or edema (take if a weight gain of 3 lbs within 24 hours or 5 lbs in a week.)., Disp: 90 tablet, Rfl: 3   hydrOXYzine  (ATARAX ) 25 MG tablet, Take 25 mg by mouth 3 (three) times daily as needed for anxiety., Disp: , Rfl:    mirtazapine  (REMERON ) 45 MG tablet, Take 45 mg by mouth at bedtime., Disp: , Rfl:    potassium chloride  SA (KLOR-CON  M) 20 MEQ tablet, Take 2 tablets (40 mEq total) by mouth daily. (Patient taking differently: Take 40 mEq by mouth daily. Patient takes 1 tablet by mouth in the morning and evening.), Disp: 60 tablet, Rfl: 3   pravastatin  (PRAVACHOL ) 10 MG tablet, Take 1 tablet (10 mg total) by mouth at bedtime., Disp: 30 tablet, Rfl:  11   QUEtiapine  (SEROQUEL  XR) 400 MG 24 hr tablet, Take 800 mg by mouth at bedtime., Disp: , Rfl:    sacubitril -valsartan  (ENTRESTO ) 97-103 MG, Take 1 tablet by mouth 2 (two) times daily., Disp: 60 tablet, Rfl: 11   spironolactone  (ALDACTONE ) 25 MG tablet, Take 1 tablet (25 mg total) by mouth daily., Disp: 30 tablet, Rfl: 11 Allergies  Allergen Reactions   Lyrica [Pregabalin] Other (See Comments)    Hallucinations      Social History   Socioeconomic History   Marital status: Significant Other    Spouse name: Not on file   Number of children: 4   Years of education: Not on file   Highest education level: Not on  file  Occupational History   Occupation: disability  Tobacco Use   Smoking status: Never    Passive exposure: Past   Smokeless tobacco: Never  Vaping Use   Vaping status: Never Used  Substance and Sexual Activity   Alcohol use: No   Drug use: No   Sexual activity: Not on file  Other Topics Concern   Not on file  Social History Narrative   FROM [place]    Lives in a 2 story apartment.  His cousin is currently staying with him.  Has 4 children.  On disability for the past 10 years for pulmonary embolism, bipolar disease.  Education: high school.   Social Drivers of Corporate Investment Banker Strain: Not on file  Food Insecurity: Patient Declined (03/01/2023)   Hunger Vital Sign    Worried About Running Out of Food in the Last Year: Patient declined    Ran Out of Food in the Last Year: Patient declined  Transportation Needs: No Transportation Needs (03/01/2023)   PRAPARE - Administrator, Civil Service (Medical): No    Lack of Transportation (Non-Medical): No  Physical Activity: Not on file  Stress: Not on file  Social Connections: Unknown (07/31/2021)   Received from [facility]   Social Network    Social Network: Not on file  Intimate Partner Violence: Not At Risk (03/01/2023)   Humiliation, Afraid, Rape, and Kick questionnaire    Fear of Current or Ex-Partner: No    Emotionally Abused: No    Physically Abused: No    Sexually Abused: No    Physical Exam      Future Appointments  Date Time Provider Department Center  05/08/2023  2:30 PM [facility] None  05/08/2023  3:00 PM Federico Norleen ONEIDA MADISON, MD [facility] None  06/03/2023  7:20 AM [facility] DEVICE REMOTES [facility]  06/11/2023 10:00 AM Lesia Ozell Barter, PA-C [facility]  09/02/2023  7:20 AM [facility] DEVICE REMOTES [facility]  12/02/2023  7:20 AM [facility] DEVICE REMOTES [facility]  03/02/2024   7:20 AM [facility] DEVICE REMOTES [facility]  06/01/2024  7:20 AM [facility] DEVICE REMOTES [facility]       Izetta Quivers, Paramedic [phone] [facility] Paramedic  04/25/23

## 2023-05-06 NOTE — Progress Notes (Deleted)
 [facility] [phone]   [phone]  PROGRESS NOTE  Patient Care Team: Pa, [facility] as PCP - General (Internal Medicine) Christell Constant, MD as PCP - Cardiology (Cardiology) Nobie Putnam, MD as PCP - Electrophysiology (Cardiology)  Hematological/Oncological History 1) March 2009: Diagnosed with pulmonary embolism with right heart strain. Started on anticoagulation with coumadin 2) October 2014: Developed subdural hematoma. INR 5.07 while on coumadin. Neurosurgery requested to hold anticoagulation. Coagulapathy was reversed with vitamin K and FFP. IV filter was placed by IR.  3) November 2014: Underwent right front parietal craniotomy with subdural hematoma evacuation. 4) 09/23/2021: While on Eliquis therapy (duration unknown), developed acute DVT in right peroneal veins. Switch to Lovenox 1 mg/kg twice daily 5) 10/12/2021: Returned to ED for worsening right leg pain. Repeat doppler US showed new clot progression further into the left peroneal vein as compared to 09/23/2021 study.  6) 10/18/2021: Establish care with [facility] Hematology with Dr. Leonides Schanz and Georga Kaufmann PA-C  -Labs showed  elevated beta-2-glycoprotein antibodies   -Switched to coumadin and INR levels managed by PCP.  7) 12/08/2021-12/10/2021: Admitted for spontaneous large left thigh intramuscular hematoma in the setting of supratherapeutic INR greater than 7.  Coumadin was discontinued and patient was switched to Eliquis.   CHIEF COMPLAINTS/PURPOSE OF CONSULTATION:  "History of PE and recent right lower extremity DVT "  HISTORY OF PRESENTING ILLNESS:  Justin [name] 58 y.o. male returns for a follow up for history of PE/DVT. He was last seen on 04/20/2022.  In the interim since her last visit he transitioned again to Pradaxa therapy twice daily.  On exam today, Justin [name] reports he is feeling tired today.  He reports his energy levels have been relatively low lately.  He reports that  he has had his ups and downs in the last 6 months.  He notes he has surgery next week upcoming for a battery exchange on his pacemaker.  He reports that he is tolerating his Pradaxa well with no bleeding, bruising, or dark stools.  He is not having any chest pain or shortness of breath.  He is having little bit of puffiness in his right ankle but no pain or discomfort.  He reports the blood thinner price is very reasonable and he is paying $0 a month.  Overall he is willing and able to continue on anticoagulation therapy at this time.  He is not having any signs or symptoms of recurrent VTE.  He denies fevers, chills, sweats, shortness of breath, chest pain or cough. He has no other complaints.  Rest of the 10 point ROS is below.  MEDICAL HISTORY:  Past Medical History:  Diagnosis Date   Achilles rupture, left    Acute deep vein thrombosis (DVT) of right peroneal vein (HCC) 10/19/2021   Bipolar 1 disorder (HCC)    CHF (congestive heart failure) (HCC)    Dental caries    periodontitis   Depression    Hypertension    Pneumonia    Pulmonary embolism (HCC) 05/18/2007   SDH (subdural hematoma) (HCC) 01/04/2013   Sleep apnea    wears CPAP   Subdural hematoma (HCC) 01/04/2013   in setting of supratherapeutic INR   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    SURGICAL HISTORY: Past Surgical History:  Procedure Laterality Date   ACHILLES TENDON SURGERY Left 10/21/2014   Procedure: Left Achilles Reconstruction;  Surgeon: Nadara Mustard, MD;  Location: [facility] OR;  Service: Orthopedics;  Laterality: Left;   APPENDECTOMY  CARDIAC CATHETERIZATION  03/04/2018   [facility]: Normal coronaries, LVEF estimated at 40%, medical Rx   CRANIOTOMY N/A 01/19/2013   Procedure: CRANIOTOMY HEMATOMA EVACUATION SUBDURAL;  Surgeon: Hewitt Shorts, MD;  Location: [facility] NEURO ORS;  Service: Neurosurgery;  Laterality: N/A;   CYSTECTOMY     right head   ELBOW SURGERY     right   FRACTURE SURGERY Right     finger- 1st digit right hand   HARDWARE REMOVAL Right 01/22/2023   Procedure: REMOVAL OF RIGHT LUMBAR FIVE-SACRAL ONE HARDWARE;  Surgeon: Barnett Abu, MD;  Location: [facility] OR;  Service: Neurosurgery;  Laterality: Right;   I & D EXTREMITY Left 12/07/2016   Procedure: LEFT ACHILLES DEBRIDEMENT;  Surgeon: Nadara Mustard, MD;  Location: [facility] OR;  Service: Orthopedics;  Laterality: Left;   LIPOMA EXCISION Left 01/22/2023   Procedure: RESECTION OF LIPOMA;  Surgeon: Barnett Abu, MD;  Location: [facility] OR;  Service: Neurosurgery;  Laterality: Left;   LUMBAR FUSION  12/23/2017   L5 GILL PROCEDURE, RIGHT L5-S1, TRANSFORAMIAL LUMBAR INTERBODY FUSION, BILATERAL LATERAL FUSION, PEDICLE INSTRUMENTATION   MULTIPLE EXTRACTIONS WITH ALVEOLOPLASTY N/A 03/07/2015   Procedure: MULTIPLE EXTRACTION WITH ALVEOLOPLASTY;  Surgeon: [name], DDS;  Location: [facility] OR;  Service: Oral Surgery;  Laterality: N/A;   RIGHT/LEFT HEART CATH AND CORONARY ANGIOGRAPHY N/A 05/18/2022   Procedure: RIGHT/LEFT HEART CATH AND CORONARY ANGIOGRAPHY;  Surgeon: Laurey Morale, MD;  Location: [facility] INVASIVE CV LAB;  Service: Cardiovascular;  Laterality: N/A;   SPINAL CORD STIMULATOR INSERTION N/A 05/18/2019   Procedure: LUMBAR SPINAL CORD STIMULATOR INSERTION;  Surgeon: Odette Fraction, MD;  Location: [facility] OR;  Service: Neurosurgery;  Laterality: N/A;  Thoracic/Lumbar   SPINAL CORD STIMULATOR INSERTION N/A 04/06/2020   Procedure: Revision of spinal cord stimulator;  Surgeon: Renaldo Fiddler, MD;  Location: [facility] OR;  Service: Neurosurgery;  Laterality: N/A;   SPINAL CORD STIMULATOR REMOVAL N/A 10/30/2022   Procedure: SCS REVISION OF GENERATOR;  Surgeon: Barnett Abu, MD;  Location: [facility] OR;  Service: Neurosurgery;  Laterality: N/A;  3C   SUBQ ICD IMPLANT N/A 03/01/2023   Procedure: SUBQ ICD IMPLANT;  Surgeon: Lanier Prude, MD;  Location: [facility] INVASIVE CV LAB;  Service: Cardiovascular;  Laterality: N/A;   TEE WITHOUT CARDIOVERSION N/A 05/18/2022    Procedure: TRANSESOPHAGEAL ECHOCARDIOGRAM (TEE);  Surgeon: Laurey Morale, MD;  Location: [facility] ENDOSCOPY;  Service: Cardiovascular;  Laterality: N/A;    SOCIAL HISTORY: Social History   Socioeconomic History   Marital status: Significant Other    Spouse name: Not on file   Number of children: 4   Years of education: Not on file   Highest education level: Not on file  Occupational History   Occupation: disability  Tobacco Use   Smoking status: Never    Passive exposure: Past   Smokeless tobacco: Never  Vaping Use   Vaping status: Never Used  Substance and Sexual Activity   Alcohol use: No   Drug use: No   Sexual activity: Not on file  Other Topics Concern   Not on file  Social History Narrative   FROM [place], [place]    Lives in a 2 story apartment.  His cousin is currently staying with him.  Has 4 children.  On disability for the past 10 years for pulmonary embolism, bipolar disease.  Education: high school.   Social Drivers of Corporate investment banker Strain: Not on file  Food Insecurity: Patient Declined (03/01/2023)   Hunger Vital Sign  Worried About Programme researcher, broadcasting/film/video in the Last Year: Patient declined    Barista in the Last Year: Patient declined  Transportation Needs: No Transportation Needs (03/01/2023)   PRAPARE - Administrator, Civil Service (Medical): No    Lack of Transportation (Non-Medical): No  Physical Activity: Not on file  Stress: Not on file  Social Connections: Unknown (07/31/2021)   Received from [facility]   Social Network    Social Network: Not on file  Intimate Partner Violence: Not At Risk (03/01/2023)   Humiliation, Afraid, Rape, and Kick questionnaire    Fear of Current or Ex-Partner: No    Emotionally Abused: No    Physically Abused: No    Sexually Abused: No    FAMILY HISTORY: Family History  Problem Relation Age of Onset   Hypertension Mother    Stroke Mother    Multiple sclerosis Sister     Down syndrome Son     ALLERGIES:  is allergic to lyrica [pregabalin].  MEDICATIONS:  Current Outpatient Medications  Medication Sig Dispense Refill   carvedilol (COREG) 12.5 MG tablet Take 1.5 tablets (18.75 mg total) by mouth 2 (two) times daily. 200 tablet 3   cephALEXin (KEFLEX) 500 MG capsule Take 1,000 mg by mouth 2 (two) times daily.     dabigatran (PRADAXA) 150 MG CAPS capsule Take 1 capsule (150 mg total) by mouth 2 (two) times daily. 180 capsule 3   doxepin (SINEQUAN) 75 MG capsule Take 75 mg by mouth at bedtime.     empagliflozin (JARDIANCE) 10 MG TABS tablet Take 1 tablet (10 mg total) by mouth daily before breakfast. 30 tablet 11   furosemide (LASIX) 40 MG tablet Take 1 tablet (40 mg total) by mouth as needed for fluid or edema (take if a weight gain of 3 lbs within 24 hours or 5 lbs in a week.). 90 tablet 3   hydrOXYzine (ATARAX) 25 MG tablet Take 25 mg by mouth 3 (three) times daily as needed for anxiety.     mirtazapine (REMERON) 45 MG tablet Take 45 mg by mouth at bedtime.     potassium chloride SA (KLOR-CON M) 20 MEQ tablet Take 2 tablets (40 mEq total) by mouth daily. (Patient taking differently: Take 40 mEq by mouth daily. Patient takes 1 tablet by mouth in the morning and evening.) 60 tablet 3   pravastatin (PRAVACHOL) 10 MG tablet Take 1 tablet (10 mg total) by mouth at bedtime. 30 tablet 11   QUEtiapine (SEROQUEL XR) 400 MG 24 hr tablet Take 800 mg by mouth at bedtime.     sacubitril-valsartan (ENTRESTO) 97-103 MG Take 1 tablet by mouth 2 (two) times daily. 60 tablet 11   spironolactone (ALDACTONE) 25 MG tablet Take 1 tablet (25 mg total) by mouth daily. 30 tablet 11   No current facility-administered medications for this visit.    REVIEW OF SYSTEMS:   Constitutional: ( - ) fevers, ( - )  chills , ( - ) night sweats Eyes: ( - ) blurriness of vision, ( - ) double vision, ( - ) watery eyes Ears, nose, mouth, throat, and face: ( - ) mucositis, ( - ) sore  throat Respiratory: ( - ) cough, ( - ) dyspnea, ( - ) wheezes Cardiovascular: ( - ) palpitation, ( +) chest discomfort, ( + ) lower extremity swelling Gastrointestinal:  ( - ) nausea, ( - ) heartburn, ( - ) change in bowel habits Skin: ( - )  abnormal skin rashes Lymphatics: ( - ) new lymphadenopathy, ( - ) easy bruising Neurological: ( - ) numbness, ( - ) tingling, ( - ) new weaknesses Behavioral/Psych: ( - ) mood change, ( - ) new changes  All other systems were reviewed with the patient and are negative.  PHYSICAL EXAMINATION: ECOG PERFORMANCE STATUS: 1 - Symptomatic but completely ambulatory  There were no vitals filed for this visit.   There were no vitals filed for this visit.    GENERAL: well appearing male in NAD  SKIN: skin color, texture, turgor are normal, no rashes or significant lesions EYES: conjunctiva are pink and non-injected, sclera clear NECK: supple, non-tender LUNGS: clear to auscultation and percussion with normal breathing effort HEART: regular rate & rhythm and no murmurs.  Musculoskeletal: no cyanosis of digits and no clubbing  PSYCH: alert & oriented x 3, fluent speech NEURO: no focal motor/sensory deficits  LABORATORY DATA:  I have reviewed the data as listed    Latest Ref Rng & Units 03/01/2023   12:52 PM 01/14/2023    9:35 AM 12/11/2022    3:02 PM  CBC  WBC 4.0 - 10.5 K/uL 6.1  6.6  7.2   Hemoglobin 13.0 - 17.0 g/dL 24.4  01.0  27.2   Hematocrit 39.0 - 52.0 % 40.5  37.6  36.3   Platelets 150 - 400 K/uL 237  246  268        Latest Ref Rng & Units 04/03/2023   11:41 AM 03/01/2023   12:52 PM 02/06/2023   12:37 PM  CMP  Glucose 70 - 99 mg/dL 536  644  034   BUN 6 - 20 mg/dL 16  12  8    Creatinine 0.61 - 1.24 mg/dL 7.42  5.95  6.38   Sodium 135 - 145 mmol/L 141  142  139   Potassium 3.5 - 5.1 mmol/L 4.4  4.4  4.6   Chloride 98 - 111 mmol/L 110  108  108   CO2 22 - 32 mmol/L 26  23  25    Calcium 8.9 - 10.3 mg/dL 9.0  9.5  9.0     RADIOGRAPHIC STUDIES: I have personally reviewed the radiological images as listed and agreed with the findings in the report. No results found.  ASSESSMENT & PLAN Justin [name] is a 58 y.o. male who presents to the clinc for history of pulmonary embolism and recent acute DVT of right lower extremity. We reviewed provoking factors that can cause VTEs includin prolonged travel/immobility, surgery (particular abdominal or orthropedic), trauma,  and pregnancy/ estrogen containing birth control. After a detailed history and review of the records there is no clear provoking factor for this patient's VTE.  Patients with unprovoked VTEs have up to 25% recurrence after 5 years and 36% at 10 years, with 4% of these clots being fatal ([phone]). Therefore the formal recommendation for unprovoked VTE's is lifelong anticoagulation, as the cause may not be transient or reversible.   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  --Since there is evidence of clot progression seen on 10/12/2021 while on Lovenox therapy, patient was switched to Coumadin.  --In September 2023, patient was found to have spontaneous large left thigh intramuscular hematoma in the setting of supratherapeutic INR greater than 7.  Coumadin was discontinued  and patient was switched to Eliquis.  --Discussed that since patient failed Eliquis therapy in the past and unable to reach therapeutic dose with Coumadin, recommend Pradaxa 150 mg twice daily.   -- Based on the patient's beta-2 glycoprotein labs his findings are most consistent with antiphospholipid antibody syndrome.  Given that he only has beta-2 glycoprotein's positive and in no anticardiolipin's it should be appropriate to continue Pradaxa  therapy.  (High risk APS requires Coumadin therapy). PLAN: --Labs today show white blood cell ***.  -- continue Pradaxa 150 mg twice daily --RTC in 6 months' time with strict return precautions for overt signs of bleeding.    No orders of the defined types were placed in this encounter.   All questions were answered. The patient knows to call the clinic with any problems, questions or concerns.  I have spent a total of 25 minutes minutes of face-to-face and non-face-to-face time, preparing to see the patient,  performing a medically appropriate examination, counseling and educating the patient, ordering medications/test,  documenting clinical information in the electronic health record and care coordination.   Ulysees Barns, MD Department of Hematology/Oncology [facility] at [facility] Phone: [phone] Pager: [phone] Email: [email or web address]

## 2023-05-07 ENCOUNTER — Inpatient Hospital Stay: Payer: No Typology Code available for payment source | Attending: Physician Assistant

## 2023-05-07 ENCOUNTER — Telehealth: Payer: Self-pay | Admitting: Hematology and Oncology

## 2023-05-07 ENCOUNTER — Telehealth (HOSPITAL_COMMUNITY): Payer: Self-pay | Admitting: Pharmacy Technician

## 2023-05-07 ENCOUNTER — Other Ambulatory Visit (HOSPITAL_COMMUNITY): Payer: Self-pay

## 2023-05-07 DIAGNOSIS — M79604 Pain in right leg: Secondary | ICD-10-CM | POA: Diagnosis not present

## 2023-05-07 DIAGNOSIS — Z86711 Personal history of pulmonary embolism: Secondary | ICD-10-CM | POA: Insufficient documentation

## 2023-05-07 DIAGNOSIS — I82451 Acute embolism and thrombosis of right peroneal vein: Secondary | ICD-10-CM

## 2023-05-07 DIAGNOSIS — Z86718 Personal history of other venous thrombosis and embolism: Secondary | ICD-10-CM | POA: Insufficient documentation

## 2023-05-07 DIAGNOSIS — Z7901 Long term (current) use of anticoagulants: Secondary | ICD-10-CM | POA: Diagnosis not present

## 2023-05-07 LAB — CBC WITH DIFFERENTIAL (CANCER CENTER ONLY)
Abs Immature Granulocytes: 0.01 10*3/uL (ref 0.00–0.07)
Basophils Absolute: 0.1 10*3/uL (ref 0.0–0.1)
Basophils Relative: 1 %
Eosinophils Absolute: 0.1 10*3/uL (ref 0.0–0.5)
Eosinophils Relative: 3 %
HCT: 39.4 % (ref 39.0–52.0)
Hemoglobin: 13.2 g/dL (ref 13.0–17.0)
Immature Granulocytes: 0 %
Lymphocytes Relative: 50 %
Lymphs Abs: 2.6 10*3/uL (ref 0.7–4.0)
MCH: 28 pg (ref 26.0–34.0)
MCHC: 33.5 g/dL (ref 30.0–36.0)
MCV: 83.5 fL (ref 80.0–100.0)
Monocytes Absolute: 0.4 10*3/uL (ref 0.1–1.0)
Monocytes Relative: 8 %
Neutro Abs: 2 10*3/uL (ref 1.7–7.7)
Neutrophils Relative %: 38 %
Platelet Count: 217 10*3/uL (ref 150–400)
RBC: 4.72 MIL/uL (ref 4.22–5.81)
RDW: 13.7 % (ref 11.5–15.5)
WBC Count: 5.2 10*3/uL (ref 4.0–10.5)
nRBC: 0 % (ref 0.0–0.2)

## 2023-05-07 LAB — CMP (CANCER CENTER ONLY)
ALT: 11 U/L (ref 0–44)
AST: 13 U/L — ABNORMAL LOW (ref 15–41)
Albumin: 4.3 g/dL (ref 3.5–5.0)
Alkaline Phosphatase: 116 U/L (ref 38–126)
Anion gap: 4 — ABNORMAL LOW (ref 5–15)
BUN: 11 mg/dL (ref 6–20)
CO2: 28 mmol/L (ref 22–32)
Calcium: 8.9 mg/dL (ref 8.9–10.3)
Chloride: 109 mmol/L (ref 98–111)
Creatinine: 1.24 mg/dL (ref 0.61–1.24)
GFR, Estimated: >60 mL/min (ref >60)
Glucose, Bld: 116 mg/dL — ABNORMAL HIGH (ref 70–99)
Potassium: 4.2 mmol/L (ref 3.5–5.1)
Sodium: 141 mmol/L (ref 135–145)
Total Bilirubin: 0.3 mg/dL (ref 0.0–1.2)
Total Protein: 7.1 g/dL (ref 6.5–8.1)

## 2023-05-07 NOTE — Telephone Encounter (Signed)
Patient Advocate Encounter   Received notification from [employer] that prior authorization for [place] Pepper is required.   PA submitted on CoverMyMeds Key [id number] Status is pending   Will continue to follow.

## 2023-05-08 ENCOUNTER — Ambulatory Visit: Payer: MEDICAID | Admitting: Hematology and Oncology

## 2023-05-08 ENCOUNTER — Inpatient Hospital Stay (HOSPITAL_BASED_OUTPATIENT_CLINIC_OR_DEPARTMENT_OTHER): Payer: MEDICAID | Admitting: Physician Assistant

## 2023-05-08 ENCOUNTER — Other Ambulatory Visit: Payer: MEDICAID

## 2023-05-08 DIAGNOSIS — M79604 Pain in right leg: Secondary | ICD-10-CM

## 2023-05-08 DIAGNOSIS — Z86718 Personal history of other venous thrombosis and embolism: Secondary | ICD-10-CM

## 2023-05-08 NOTE — Telephone Encounter (Signed)
Advanced Heart Failure Patient Advocate Encounter  Prior Authorization for [place] Pepper has been approved.    PA# [id number] Effective dates: 05/07/23 through 05/07/24  Archer Asa, CPhT

## 2023-05-08 NOTE — Progress Notes (Signed)
[facility] [phone]   [phone]  PROGRESS NOTE  Patient Care Team: [facility], [facility] as PCP - General (Internal Medicine) Christell Constant, MD as PCP - Cardiology (Cardiology) Nobie Putnam, MD as PCP - Electrophysiology (Cardiology)  I connected with Justin [name]  on 02/19/25by telephone visit and verified that I am speaking with the correct person using two identifiers.   I discussed the limitations, risks, security and privacy concerns of performing an evaluation and management service by telemedicine and the availability of in-person appointments. I also discussed with the patient that there may be a patient responsible charge related to this service. The patient expressed understanding and agreed to proceed.   Patient's location: Home Provider's location: Office  Hematological/Oncological History 1) March 2009: Diagnosed with pulmonary embolism with right heart strain. Started on anticoagulation with coumadin 2) October 2014: Developed subdural hematoma. INR 5.07 while on coumadin. Neurosurgery requested to hold anticoagulation. Coagulapathy was reversed with vitamin K and FFP. IV filter was placed by IR.  3) November 2014: Underwent right front parietal craniotomy with subdural hematoma evacuation. 4) 09/23/2021: While on Eliquis therapy (duration unknown), developed acute DVT in right peroneal veins. Switch to Lovenox 1 mg/kg twice daily 5) 10/12/2021: Returned to ED for worsening right leg pain. Repeat doppler US showed new clot progression further into the left peroneal vein as compared to 09/23/2021 study.  6) 10/18/2021: Establish care with [name] Hematology with Dr. Leonides Schanz and Georga Kaufmann PA-C  -Labs showed  elevated beta-2-glycoprotein antibodies   -Switched to coumadin and INR levels managed by PCP.  7) 12/08/2021-12/10/2021: Admitted for spontaneous large left thigh intramuscular hematoma in the setting of supratherapeutic INR  greater than 7.  Coumadin was discontinued and patient was switched to Eliquis.   CHIEF COMPLAINTS/PURPOSE OF CONSULTATION:  "History of PE and recent right lower extremity DVT "  HISTORY OF PRESENTING ILLNESS:  Justin [name] 58 y.o. male returns for a follow up for history of PE/DVT. He was last seen on 10/23/2022.  In the interim since hislast visit, he continues on Pradaxa therapy twice daily.  On exam today, Mr. Cavitt reports having some bruising in the right leg recently which has resolved He denies any signs of active bleeding He reports having pain in his right leg over the last few months. He has stable shortness of breath with exertion but none at rest.   He denies fevers, chills, sweats, shortness of breath, chest pain or cough. He has no other complaints.  Rest of the 10 point ROS is below.  MEDICAL HISTORY:  Past Medical History:  Diagnosis Date   Achilles rupture, left    Acute deep vein thrombosis (DVT) of right peroneal vein (HCC) 10/19/2021   Bipolar 1 disorder (HCC)    CHF (congestive heart failure) (HCC)    Dental caries    periodontitis   Depression    Hypertension    Pneumonia    Pulmonary embolism (HCC) 05/18/2007   SDH (subdural hematoma) (HCC) 01/04/2013   Sleep apnea    wears CPAP   Subdural hematoma (HCC) 01/04/2013   in setting of supratherapeutic INR   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    SURGICAL HISTORY: Past Surgical History:  Procedure Laterality Date   ACHILLES TENDON SURGERY Left 10/21/2014   Procedure: Left Achilles Reconstruction;  Surgeon: Nadara Mustard, MD;  Location: [facility] OR;  Service: Orthopedics;  Laterality: Left;   APPENDECTOMY     CARDIAC CATHETERIZATION  03/04/2018   [facility]: Normal coronaries, LVEF estimated at 40%, medical Rx   CRANIOTOMY N/A 01/19/2013   Procedure: CRANIOTOMY HEMATOMA EVACUATION SUBDURAL;  Surgeon: Hewitt Shorts, MD;  Location: [facility] NEURO ORS;  Service: Neurosurgery;  Laterality: N/A;    CYSTECTOMY     right head   ELBOW SURGERY     right   FRACTURE SURGERY Right    finger- 1st digit right hand   HARDWARE REMOVAL Right 01/22/2023   Procedure: REMOVAL OF RIGHT LUMBAR FIVE-SACRAL ONE HARDWARE;  Surgeon: Barnett Abu, MD;  Location: [facility] OR;  Service: Neurosurgery;  Laterality: Right;   I & D EXTREMITY Left 12/07/2016   Procedure: LEFT ACHILLES DEBRIDEMENT;  Surgeon: Nadara Mustard, MD;  Location: [facility] OR;  Service: Orthopedics;  Laterality: Left;   LIPOMA EXCISION Left 01/22/2023   Procedure: RESECTION OF LIPOMA;  Surgeon: Barnett Abu, MD;  Location: [facility] OR;  Service: Neurosurgery;  Laterality: Left;   LUMBAR FUSION  12/23/2017   L5 GILL PROCEDURE, RIGHT L5-S1, TRANSFORAMIAL LUMBAR INTERBODY FUSION, BILATERAL LATERAL FUSION, PEDICLE INSTRUMENTATION   MULTIPLE EXTRACTIONS WITH ALVEOLOPLASTY N/A 03/07/2015   Procedure: MULTIPLE EXTRACTION WITH ALVEOLOPLASTY;  Surgeon: [name], DDS;  Location: [facility] OR;  Service: Oral Surgery;  Laterality: N/A;   RIGHT/LEFT HEART CATH AND CORONARY ANGIOGRAPHY N/A 05/18/2022   Procedure: RIGHT/LEFT HEART CATH AND CORONARY ANGIOGRAPHY;  Surgeon: Laurey Morale, MD;  Location: [facility] INVASIVE CV LAB;  Service: Cardiovascular;  Laterality: N/A;   SPINAL CORD STIMULATOR INSERTION N/A 05/18/2019   Procedure: LUMBAR SPINAL CORD STIMULATOR INSERTION;  Surgeon: Odette Fraction, MD;  Location: [facility] OR;  Service: Neurosurgery;  Laterality: N/A;  Thoracic/Lumbar   SPINAL CORD STIMULATOR INSERTION N/A 04/06/2020   Procedure: Revision of spinal cord stimulator;  Surgeon: Renaldo Fiddler, MD;  Location: [facility] OR;  Service: Neurosurgery;  Laterality: N/A;   SPINAL CORD STIMULATOR REMOVAL N/A 10/30/2022   Procedure: SCS REVISION OF GENERATOR;  Surgeon: Barnett Abu, MD;  Location: [facility] OR;  Service: Neurosurgery;  Laterality: N/A;  3C   SUBQ ICD IMPLANT N/A 03/01/2023   Procedure: SUBQ ICD IMPLANT;  Surgeon: Lanier Prude, MD;  Location: [facility] INVASIVE CV LAB;  Service:  Cardiovascular;  Laterality: N/A;   TEE WITHOUT CARDIOVERSION N/A 05/18/2022   Procedure: TRANSESOPHAGEAL ECHOCARDIOGRAM (TEE);  Surgeon: Laurey Morale, MD;  Location: [facility] ENDOSCOPY;  Service: Cardiovascular;  Laterality: N/A;    SOCIAL HISTORY: Social History   Socioeconomic History   Marital status: Significant Other    Spouse name: Not on file   Number of children: 4   Years of education: Not on file   Highest education level: Not on file  Occupational History   Occupation: disability  Tobacco Use   Smoking status: Never    Passive exposure: Past   Smokeless tobacco: Never  Vaping Use   Vaping status: Never Used  Substance and Sexual Activity   Alcohol use: No   Drug use: No   Sexual activity: Not on file  Other Topics Concern   Not on file  Social History Narrative   FROM [place], [place]    Lives in a 2 story apartment.  His cousin is currently staying with him.  Has 4 children.  On disability for the past 10 years for pulmonary embolism, bipolar disease.  Education: high school.   Social Drivers of Corporate investment banker Strain: Not on file  Food Insecurity: Patient Declined (03/01/2023)   Hunger Vital Sign    Worried About Running Out of Food  in the Last Year: Patient declined    Ran Out of Food in the Last Year: Patient declined  Transportation Needs: No Transportation Needs (03/01/2023)   PRAPARE - Administrator, Civil Service (Medical): No    Lack of Transportation (Non-Medical): No  Physical Activity: Not on file  Stress: Not on file  Social Connections: Unknown (07/31/2021)   Received from [facility]   Social Network    Social Network: Not on file  Intimate Partner Violence: Not At Risk (03/01/2023)   Humiliation, Afraid, Rape, and Kick questionnaire    Fear of Current or Ex-Partner: No    Emotionally Abused: No    Physically Abused: No    Sexually Abused: No    FAMILY HISTORY: Family History  Problem Relation Age  of Onset   Hypertension Mother    Stroke Mother    Multiple sclerosis Sister    Down syndrome Son     ALLERGIES:  is allergic to lyrica [pregabalin].  MEDICATIONS:  Current Outpatient Medications  Medication Sig Dispense Refill   carvedilol (COREG) 12.5 MG tablet Take 1.5 tablets (18.75 mg total) by mouth 2 (two) times daily. 200 tablet 3   cephALEXin (KEFLEX) 500 MG capsule Take 1,000 mg by mouth 2 (two) times daily.     dabigatran (PRADAXA) 150 MG CAPS capsule Take 1 capsule (150 mg total) by mouth 2 (two) times daily. 180 capsule 3   doxepin (SINEQUAN) 75 MG capsule Take 75 mg by mouth at bedtime.     empagliflozin (JARDIANCE) 10 MG TABS tablet Take 1 tablet (10 mg total) by mouth daily before breakfast. 30 tablet 11   furosemide (LASIX) 40 MG tablet Take 1 tablet (40 mg total) by mouth as needed for fluid or edema (take if a weight gain of 3 lbs within 24 hours or 5 lbs in a week.). 90 tablet 3   hydrOXYzine (ATARAX) 25 MG tablet Take 25 mg by mouth 3 (three) times daily as needed for anxiety.     mirtazapine (REMERON) 45 MG tablet Take 45 mg by mouth at bedtime.     potassium chloride SA (KLOR-CON M) 20 MEQ tablet Take 2 tablets (40 mEq total) by mouth daily. (Patient taking differently: Take 40 mEq by mouth daily. Patient takes 1 tablet by mouth in the morning and evening.) 60 tablet 3   pravastatin (PRAVACHOL) 10 MG tablet Take 1 tablet (10 mg total) by mouth at bedtime. 30 tablet 11   QUEtiapine (SEROQUEL XR) 400 MG 24 hr tablet Take 800 mg by mouth at bedtime.     sacubitril-valsartan (ENTRESTO) 97-103 MG Take 1 tablet by mouth 2 (two) times daily. 60 tablet 11   spironolactone (ALDACTONE) 25 MG tablet Take 1 tablet (25 mg total) by mouth daily. 30 tablet 11   No current facility-administered medications for this visit.    REVIEW OF SYSTEMS:   Constitutional: ( - ) fevers, ( - )  chills , ( - ) night sweats Eyes: ( - ) blurriness of vision, ( - ) double vision, ( - ) watery  eyes Ears, nose, mouth, throat, and face: ( - ) mucositis, ( - ) sore throat Respiratory: ( - ) cough, ( - ) dyspnea, ( - ) wheezes Cardiovascular: ( - ) palpitation, ( +) chest discomfort, ( + ) lower extremity swelling Gastrointestinal:  ( - ) nausea, ( - ) heartburn, ( - ) change in bowel habits Skin: ( - ) abnormal skin rashes Lymphatics: ( - )  new lymphadenopathy, ( - ) easy bruising Neurological: ( - ) numbness, ( - ) tingling, ( - ) new weaknesses Behavioral/Psych: ( - ) mood change, ( - ) new changes  All other systems were reviewed with the patient and are negative.  PHYSICAL EXAMINATION: Not performed due to virtual visit.  LABORATORY DATA:  I have reviewed the data as listed    Latest Ref Rng & Units 05/07/2023   11:28 AM 03/01/2023   12:52 PM 01/14/2023    9:35 AM  CBC  WBC 4.0 - 10.5 K/uL 5.2  6.1  6.6   Hemoglobin 13.0 - 17.0 g/dL 28.4  13.2  44.0   Hematocrit 39.0 - 52.0 % 39.4  40.5  37.6   Platelets 150 - 400 K/uL 217  237  246        Latest Ref Rng & Units 05/07/2023   11:28 AM 04/03/2023   11:41 AM 03/01/2023   12:52 PM  CMP  Glucose 70 - 99 mg/dL 102  725  366   BUN 6 - 20 mg/dL 11  16  12    Creatinine 0.61 - 1.24 mg/dL 4.40  3.47  4.25   Sodium 135 - 145 mmol/L 141  141  142   Potassium 3.5 - 5.1 mmol/L 4.2  4.4  4.4   Chloride 98 - 111 mmol/L 109  110  108   CO2 22 - 32 mmol/L 28  26  23    Calcium 8.9 - 10.3 mg/dL 8.9  9.0  9.5   Total Protein 6.5 - 8.1 g/dL 7.1     Total Bilirubin 0.0 - 1.2 mg/dL 0.3     Alkaline Phos 38 - 126 U/L 116     AST 15 - 41 U/L 13     ALT 0 - 44 U/L 11      RADIOGRAPHIC STUDIES: I have personally reviewed the radiological images as listed and agreed with the findings in the report. No results found.  ASSESSMENT & PLAN AMARIUS TOTO is a 58 y.o. male who presents to the clinc for history of pulmonary embolism and recent acute DVT of right lower extremity. We reviewed provoking factors that can cause VTEs includin  prolonged travel/immobility, surgery (particular abdominal or orthropedic), trauma,  and pregnancy/ estrogen containing birth control. After a detailed history and review of the records there is no clear provoking factor for this patient's VTE.  Patients with unprovoked VTEs have up to 25% recurrence after 5 years and 36% at 10 years, with 4% of these clots being fatal ([phone]). Therefore the formal recommendation for unprovoked VTE's is lifelong anticoagulation, as the cause may not be transient or reversible.   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  --Since there is evidence of clot progression seen on 10/12/2021 while on Lovenox therapy, patient was switched to Coumadin.  --In September 2023, patient was found to have spontaneous large left thigh intramuscular hematoma in the setting of supratherapeutic INR greater than 7.  Coumadin was discontinued and patient was switched to Eliquis.  --Discussed that since patient failed Eliquis therapy in the past and unable to reach therapeutic dose with Coumadin, recommend Pradaxa 150 mg twice daily.   -- Based on the patient's beta-2 glycoprotein labs his findings are most consistent with antiphospholipid antibody syndrome.  Given that he only has beta-2 glycoprotein's positive and in no anticardiolipin's it should be appropriate to continue Pradaxa therapy.  (High risk APS requires Coumadin therapy). PLAN: --Labs from yesterday show white blood cell 5.2, Hgb 13.2, Plt 217, creatinine and LFTs in range.  -- continue Pradaxa 150 mg twice daily -- will obtain repeat RLE DVT study due to persistent right leg pain. Also need to evaluate for right inguinal LAD is present as previously noted in study from 08/07/2022. Patient  denies any palpable inguinal LAD.  --RTC in 6 months with labs unless above study requires any intervention.    No orders of the defined types were placed in this encounter.   All questions were answered. The patient knows to call the clinic with any problems, questions or concerns.  I have spent a total of 25 minutes minutes of non-face-to-face time, preparing to see the patient,  performing a medically appropriate examination, counseling and educating the patient, ordering medications/test,  documenting clinical information in the electronic health record and care coordination.   Georga Kaufmann PA-C Dept of Hematology and Oncology [facility] Phone: [phone]

## 2023-05-09 ENCOUNTER — Ambulatory Visit (HOSPITAL_COMMUNITY)
Admission: RE | Admit: 2023-05-09 | Discharge: 2023-05-09 | Disposition: A | Discharge status: Home / Self Care | Payer: 59 | Source: Ambulatory Visit | Attending: Physician Assistant | Admitting: Physician Assistant

## 2023-05-09 DIAGNOSIS — Z86718 Personal history of other venous thrombosis and embolism: Secondary | ICD-10-CM | POA: Diagnosis not present

## 2023-05-15 ENCOUNTER — Other Ambulatory Visit (HOSPITAL_COMMUNITY): Payer: Self-pay

## 2023-05-15 NOTE — Progress Notes (Signed)
 Paramedicine Encounter    Patient ID: Justin [name], male    DOB: December 02, 1965, 58 y.o.   MRN: [id number]   Complaints-leg cramp-negative for DVT on 2/20   Edema-none   Compliance with meds-yes   Pill box filled-has bubble packs now  If so, by whom-pharmacy   Refills needed-no   Pt reports he is doing well. He is loving the bubble packs.  He is back at work, he does c/o leg cramp to his left leg and had DVT  study on that leg on 2/20 and it was negative.  He denies increased sob, no dizziness, no c/p. No edema noted. Appetite good. Almost too good.  At this time he is more independent and managing meds, appoints etc. He is stable for d/c of paramedicine. Will discuss with amy and jenna.     Patient is now discharged from [employer].  Patient has/has not met the following goals:  Yes :Patient expresses basic understanding of medications and what they are for Yes :Patient able to verbalize heart failure specific dietary/fluid restrictions Yes :Patient is aware of who to call if they have medical concerns or if they need to schedule or change appts Yes :Patient has a scale for daily weights and weighs regularly Yes :Patient able to verbalize concerning symptoms when they should call the HF clinic (weight gain ranges, etc) Yes :Patient has a PCP and has seen within the past year or has upcoming appt Yes :Patient has reliable access to getting their medications Yes :Patient has shown they are able to reorder medications reliably No :Patient has had admission in past 30 days- if yes how many? No :Patient has had admission in past 90 days- if yes how many?  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    BP 110/78   Pulse 77   Resp 16   Wt 226 lb (102.5 kg)   SpO2 99%   BMI 33.37 kg/m  Weight yesterday-226 Last visit weight-225  Patient Care Team: Pa, [facility] as PCP - General (Internal Medicine) Christell Constant, MD as PCP - Cardiology (Cardiology) Nobie Putnam, MD as PCP - Electrophysiology (Cardiology)  Patient Active Problem List   Diagnosis Date Noted   ICD (implantable cardioverter-defibrillator) in place 03/01/2023   Painful orthopaedic hardware (HCC) 01/22/2023   Pain in right leg 08/06/2022   Fatigue 07/31/2022   History of DVT (deep vein thrombosis) 07/17/2022   Tremors of nervous system 07/17/2022   Nonischemic cardiomyopathy (HCC) 05/18/2022   HFrEF (heart failure with reduced ejection fraction) (HCC) 05/18/2022   Snoring 04/04/2022   Normocytic anemia 12/22/2021   History of pulmonary embolism 10/19/2021   Status post lumbar spinal fusion 01/03/2018   Lumbar stenosis 12/23/2017   Spondylolisthesis, lumbar region 06/25/2017   Achilles tendinitis of left lower extremity 12/07/2016   Achilles tendinitis, left leg 05/17/2016   Obstructive sleep apnea on CPAP 03/07/2015   Achilles rupture, left 10/21/2014   Bipolar disorder, unspecified (HCC) 01/07/2013   Headache 01/07/2013   Chronic anticoagulation 01/04/2013    Current Outpatient Medications:    carvedilol (COREG) 12.5 MG tablet, Take 1.5 tablets (18.75 mg total) by mouth 2 (two) times daily., Disp: 200 tablet, Rfl: 3   cephALEXin (KEFLEX) 500 MG capsule, Take 1,000 mg by mouth 2 (two) times daily., Disp: , Rfl:    dabigatran (PRADAXA) 150 MG CAPS capsule, Take 1 capsule (150 mg total) by mouth 2 (two) times daily., Disp: 180 capsule, Rfl:  3   doxepin (SINEQUAN) 75 MG capsule, Take 75 mg by mouth at bedtime., Disp: , Rfl:    empagliflozin (JARDIANCE) 10 MG TABS tablet, Take 1 tablet (10 mg total) by mouth daily before breakfast., Disp: 30 tablet, Rfl: 11   furosemide (LASIX) 40 MG tablet, Take 1 tablet (40 mg total) by mouth as needed for fluid or edema (take if a weight gain of 3 lbs within 24 hours or 5 lbs in a week.)., Disp: 90 tablet, Rfl: 3   hydrOXYzine (ATARAX) 25 MG tablet, Take 25 mg by mouth 3 (three) times  daily as needed for anxiety., Disp: , Rfl:    mirtazapine (REMERON) 45 MG tablet, Take 45 mg by mouth at bedtime., Disp: , Rfl:    potassium chloride SA (KLOR-CON M) 20 MEQ tablet, Take 2 tablets (40 mEq total) by mouth daily. (Patient taking differently: Take 40 mEq by mouth daily. Patient takes 1 tablet by mouth in the morning and evening.), Disp: 60 tablet, Rfl: 3   pravastatin (PRAVACHOL) 10 MG tablet, Take 1 tablet (10 mg total) by mouth at bedtime., Disp: 30 tablet, Rfl: 11   QUEtiapine (SEROQUEL XR) 400 MG 24 hr tablet, Take 800 mg by mouth at bedtime., Disp: , Rfl:    sacubitril-valsartan (ENTRESTO) 97-103 MG, Take 1 tablet by mouth 2 (two) times daily., Disp: 60 tablet, Rfl: 11   spironolactone (ALDACTONE) 25 MG tablet, Take 1 tablet (25 mg total) by mouth daily., Disp: 30 tablet, Rfl: 11 Allergies  Allergen Reactions   Lyrica [Pregabalin] Other (See Comments)    Hallucinations      Social History   Socioeconomic History   Marital status: Significant Other    Spouse name: Not on file   Number of children: 4   Years of education: Not on file   Highest education level: Not on file  Occupational History   Occupation: disability  Tobacco Use   Smoking status: Never    Passive exposure: Past   Smokeless tobacco: Never  Vaping Use   Vaping status: Never Used  Substance and Sexual Activity   Alcohol use: No   Drug use: No   Sexual activity: Not on file  Other Topics Concern   Not on file  Social History Narrative   FROM [place], [place]    Lives in a 2 story apartment.  His cousin is currently staying with him.  Has 4 children.  On disability for the past 10 years for pulmonary embolism, bipolar disease.  Education: high school.   Social Drivers of Corporate investment banker Strain: Not on file  Food Insecurity: Patient Declined (03/01/2023)   Hunger Vital Sign    Worried About Running Out of Food in the Last Year: Patient declined    Ran Out of Food in the Last Year:  Patient declined  Transportation Needs: No Transportation Needs (03/01/2023)   PRAPARE - Administrator, Civil Service (Medical): No    Lack of Transportation (Non-Medical): No  Physical Activity: Not on file  Stress: Not on file  Social Connections: Unknown (07/31/2021)   [facility]   Social Network    Social Network: Not on file  Intimate Partner Violence: Not At Risk (03/01/2023)   Humiliation, Afraid, Rape, and Kick questionnaire    Fear of Current or Ex-Partner: No    Emotionally Abused: No    Physically Abused: No    Sexually Abused: No    Physical Exam  Future Appointments  Date Time Provider Department Center  06/03/2023  7:20 AM [facility] DEVICE REMOTES [facility]  06/11/2023 10:00 AM Graciella Freer, PA-C [facility]  09/02/2023  7:20 AM [facility] DEVICE REMOTES [facility]  11/06/2023 10:45 AM [facility] LAB [facility] None  11/06/2023 11:20 AM Jaci Standard, MD [facility] None  12/02/2023  7:20 AM [facility] DEVICE REMOTES [facility]  03/02/2024  7:20 AM [facility] DEVICE REMOTES [facility]  06/01/2024  7:20 AM [facility] DEVICE REMOTES [facility]       Kerry Hough, Paramedic [phone] [facility] Paramedic  05/15/23

## 2023-06-03 ENCOUNTER — Ambulatory Visit (INDEPENDENT_AMBULATORY_CARE_PROVIDER_SITE_OTHER): Payer: MEDICAID

## 2023-06-03 DIAGNOSIS — I428 Other cardiomyopathies: Secondary | ICD-10-CM

## 2023-06-03 DIAGNOSIS — I5022 Chronic systolic (congestive) heart failure: Secondary | ICD-10-CM | POA: Diagnosis not present

## 2023-06-03 LAB — CUP PACEART REMOTE DEVICE CHECK
Battery Remaining Percentage: 98 %
Date Time Interrogation Session: 20250317101300
HighPow Impedance: 70 Ohm
Implantable Lead Connection Status: 753985
Implantable Lead Implant Date: 20241213
Implantable Lead Location: 753862
Implantable Lead Model: 3501
Implantable Lead Serial Number: 238967
Implantable Pulse Generator Implant Date: 20241213
Pulse Gen Serial Number: 316377

## 2023-06-07 NOTE — Progress Notes (Signed)
  Electrophysiology Office Note:   ID:  Justin [name], DOB 1966-02-01, MRN [id number]  Primary Cardiologist: Christell Constant, MD Electrophysiologist: Lanier Prude, MD      History of Present Illness:   Justin [name] is a 58 y.o. male with h/o PE/DVT, bipolar, and chronic systolic CHF seen today for routine electrophysiology followup.   Since last being seen in our clinic the patient reports doing well overall. Still having some static shocks around the house. Understands it is not likely from the device. He denies chest pain, palpitations, dyspnea, PND, orthopnea, nausea, vomiting, dizziness, syncope, edema, weight gain, or early satiety.   Review of systems complete and found to be negative unless listed in HPI.   EP Information / Studies Reviewed:    EKG is not ordered today. EKG from 01/2023 reviewed which showed NSR at 83 bpm with PACs       ICD Interrogation-  reviewed in detail today,  See PACEART report.  Arrhythmia/Device History [employer] S-ICD 02/2023 for CHF/NICM   Physical Exam:   VS:  BP 108/82   Pulse 88   Ht 5\' 9"  (1.753 m)   Wt 227 lb (103 kg)   SpO2 96%   BMI 33.52 kg/m    Wt Readings from Last 3 Encounters:  06/11/23 227 lb (103 kg)  05/15/23 226 lb (102.5 kg)  04/25/23 225 lb (102.1 kg)     GEN: No acute distress  NECK: No JVD; No carotid bruits CARDIAC: Regular rate and rhythm, no murmurs, rubs, gallops RESPIRATORY:  Clear to auscultation without rales, wheezing or rhonchi  ABDOMEN: Soft, non-tender, non-distended EXTREMITIES:  No edema; No deformity   ASSESSMENT AND PLAN:    Chronic systolic CHF  s/p Environmental manager S-ICD  NICM euvolemic today Stable on an appropriate medical regimen Normal ICD function See Pace Art report No changes today  Disposition:   Follow up with Dr. Lalla Brothers in 12 months   Signed, Graciella Freer, PA-C

## 2023-06-11 ENCOUNTER — Ambulatory Visit: Payer: MEDICAID | Attending: Student | Admitting: Student

## 2023-06-11 ENCOUNTER — Encounter: Payer: Self-pay | Admitting: Student

## 2023-06-11 VITALS — BP 108/82 | HR 88 | Ht 69.0 in | Wt 227.0 lb

## 2023-06-11 DIAGNOSIS — I428 Other cardiomyopathies: Secondary | ICD-10-CM

## 2023-06-11 DIAGNOSIS — I5022 Chronic systolic (congestive) heart failure: Secondary | ICD-10-CM | POA: Diagnosis not present

## 2023-06-11 LAB — CUP PACEART INCLINIC DEVICE CHECK
Date Time Interrogation Session: 20250325110203
Implantable Lead Connection Status: 753985
Implantable Lead Implant Date: 20241213
Implantable Lead Location: 753862
Implantable Lead Model: 3501
Implantable Lead Serial Number: 238967
Implantable Pulse Generator Implant Date: 20241213
Pulse Gen Serial Number: 316377

## 2023-06-11 NOTE — Patient Instructions (Addendum)
 Medication Instructions:  Your physician recommends that you continue on your current medications as directed. Please refer to the Current Medication list given to you today.  *If you need a refill on your cardiac medications before your next appointment, please call your pharmacy*  Lab Work: None ordered If you have labs (blood work) drawn today and your tests are completely normal, you will receive your results only by: MyChart Message (if you have MyChart) OR A paper copy in the mail If you have any lab test that is abnormal or we need to change your treatment, we will call you to review the results.  Follow-Up: At [facility], you and your health needs are our priority.  As part of our continuing mission to provide you with exceptional heart care, we have created designated Provider Care Teams.  These Care Teams include your primary Cardiologist (physician) and Advanced Practice Providers (APPs -  Physician Assistants and Nurse Practitioners) who all work together to provide you with the care you need, when you need it.  Your next appointment:   1 year(s)  Provider:   Nobie Putnam, MD   Schedule follow up with heart failure clinic for April 2025

## 2023-07-02 ENCOUNTER — Other Ambulatory Visit (HOSPITAL_COMMUNITY): Payer: Self-pay

## 2023-07-02 DIAGNOSIS — I5022 Chronic systolic (congestive) heart failure: Secondary | ICD-10-CM

## 2023-07-02 MED ORDER — POTASSIUM CHLORIDE CRYS ER 20 MEQ PO TBCR: 40.0000 meq | EXTENDED_RELEASE_TABLET | Freq: Every day | ORAL | 5 refills | Status: DC

## 2023-07-03 ENCOUNTER — Ambulatory Visit (HOSPITAL_COMMUNITY)
Admission: EM | Admit: 2023-07-03 | Discharge: 2023-07-03 | Disposition: A | Discharge status: Home / Self Care | Payer: MEDICAID | Attending: Emergency Medicine | Admitting: Emergency Medicine

## 2023-07-03 ENCOUNTER — Encounter (HOSPITAL_COMMUNITY): Payer: Self-pay

## 2023-07-03 DIAGNOSIS — R519 Headache, unspecified: Secondary | ICD-10-CM | POA: Diagnosis not present

## 2023-07-03 MED ORDER — KETOROLAC TROMETHAMINE 30 MG/ML IJ SOLN
INTRAMUSCULAR | Status: AC
  Filled 2023-07-03: qty 1

## 2023-07-03 MED ORDER — DEXAMETHASONE SODIUM PHOSPHATE 10 MG/ML IJ SOLN
10.0000 mg | Freq: Once | INTRAMUSCULAR | Status: AC
  Administered 2023-07-03: 10 mg via INTRAMUSCULAR

## 2023-07-03 MED ORDER — KETOROLAC TROMETHAMINE 30 MG/ML IJ SOLN
15.0000 mg | Freq: Once | INTRAMUSCULAR | Status: AC
  Administered 2023-07-03: 15 mg via INTRAMUSCULAR

## 2023-07-03 MED ORDER — DEXAMETHASONE SODIUM PHOSPHATE 10 MG/ML IJ SOLN
INTRAMUSCULAR | Status: AC
  Filled 2023-07-03: qty 1

## 2023-07-03 NOTE — ED Provider Notes (Signed)
 [facility]    CSN: [id number] Arrival date & time: 07/03/23  1051      History   Chief Complaint Chief Complaint  Patient presents with   Headache    HPI Justin [name] is a 58 y.o. male.   Patient presents with headache that began yesterday.  Patient rates pain 9 out of 10.  Denies nausea, vomiting, blurred vision, photophobia, dizziness, weakness, numbness, confusion, and slurred speech.  Patient states that he believes his headache could be related to recent mold exposure in his home.  Patient states that he noticed an area to the top of his head that appeared to be a bruise, that has since resolved.  Patient does take Pradaxa daily and has been taking this for years without any issues.  Patient denies any recent falls or hitting his head.   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   Painful orthopaedic hardware (HCC) 01/22/2023   Pain in right leg 08/06/2022   Fatigue 07/31/2022   History of DVT (deep vein thrombosis) 07/17/2022   Tremors of nervous system 07/17/2022   Nonischemic cardiomyopathy (HCC) 05/18/2022   HFrEF (heart failure with reduced ejection fraction) (HCC) 05/18/2022   Snoring 04/04/2022   Normocytic anemia 12/22/2021   History of pulmonary embolism 10/19/2021   Status post lumbar spinal fusion 01/03/2018   Lumbar stenosis  12/23/2017   Spondylolisthesis, lumbar region 06/25/2017   Achilles tendinitis of left lower extremity 12/07/2016   Achilles tendinitis, left leg 05/17/2016   Obstructive sleep apnea on CPAP 03/07/2015   Achilles rupture, left 10/21/2014   Bipolar disorder, unspecified (HCC) 01/07/2013   Headache 01/07/2013   Chronic anticoagulation 01/04/2013    Past Surgical History:  Procedure Laterality Date   ACHILLES TENDON SURGERY Left 10/21/2014   Procedure: Left Achilles Reconstruction;  Surgeon: Nadara Mustard, MD;  Location: [facility] OR;  Service: Orthopedics;  Laterality: Left;   APPENDECTOMY     CARDIAC CATHETERIZATION  03/04/2018   [facility]: Normal coronaries, LVEF estimated at 40%, medical Rx   CRANIOTOMY N/A 01/19/2013   Procedure: CRANIOTOMY HEMATOMA EVACUATION SUBDURAL;  Surgeon: Hewitt Shorts, MD;  Location: [facility];  Service: Neurosurgery;  Laterality: N/A;   CYSTECTOMY     right head   ELBOW SURGERY     right   FRACTURE SURGERY Right    finger- 1st digit right hand   HARDWARE REMOVAL Right 01/22/2023   Procedure: REMOVAL OF RIGHT LUMBAR FIVE-SACRAL ONE HARDWARE;  Surgeon: Barnett Abu, MD;  Location: [facility] OR;  Service: Neurosurgery;  Laterality: Right;   I & D EXTREMITY Left 12/07/2016   Procedure: LEFT ACHILLES DEBRIDEMENT;  Surgeon: Nadara Mustard, MD;  Location: [facility] OR;  Service: Orthopedics;  Laterality: Left;   LIPOMA EXCISION Left 01/22/2023   Procedure: RESECTION OF LIPOMA;  Surgeon: Barnett Abu, MD;  Location: [facility] OR;  Service: Neurosurgery;  Laterality: Left;   LUMBAR FUSION  12/23/2017   L5 GILL PROCEDURE, RIGHT L5-S1, TRANSFORAMIAL LUMBAR INTERBODY FUSION, BILATERAL LATERAL FUSION, PEDICLE INSTRUMENTATION   MULTIPLE EXTRACTIONS WITH ALVEOLOPLASTY N/A 03/07/2015   Procedure: MULTIPLE EXTRACTION WITH ALVEOLOPLASTY;  Surgeon: [name], DDS;  Location: [facility] OR;  Service: Oral Surgery;  Laterality: N/A;   RIGHT/LEFT HEART CATH AND CORONARY ANGIOGRAPHY N/A  05/18/2022   Procedure: RIGHT/LEFT HEART CATH AND CORONARY ANGIOGRAPHY;  Surgeon: Laurey Morale, MD;  Location: [facility] INVASIVE CV LAB;  Service: Cardiovascular;  Laterality: N/A;   SPINAL CORD STIMULATOR INSERTION N/A 05/18/2019   Procedure: LUMBAR SPINAL CORD STIMULATOR INSERTION;  Surgeon: Odette Fraction, MD;  Location: [facility] OR;  Service: Neurosurgery;  Laterality: N/A;  Thoracic/Lumbar   SPINAL CORD STIMULATOR INSERTION N/A 04/06/2020   Procedure: Revision of spinal cord stimulator;  Surgeon: Renaldo Fiddler, MD;  Location: [facility] OR;  Service: Neurosurgery;  Laterality: N/A;   SPINAL CORD STIMULATOR REMOVAL N/A 10/30/2022   Procedure: SCS REVISION OF GENERATOR;  Surgeon: Barnett Abu, MD;  Location: [facility] OR;  Service: Neurosurgery;  Laterality: N/A;  3C   SUBQ ICD IMPLANT N/A 03/01/2023   Procedure: SUBQ ICD IMPLANT;  Surgeon: Lanier Prude, MD;  Location: [facility] INVASIVE CV LAB;  Service: Cardiovascular;  Laterality: N/A;   TEE WITHOUT CARDIOVERSION N/A 05/18/2022   Procedure: TRANSESOPHAGEAL ECHOCARDIOGRAM (TEE);  Surgeon: Laurey Morale, MD;  Location: [facility] ENDOSCOPY;  Service: Cardiovascular;  Laterality: N/A;       Home Medications    Prior to Admission medications   Medication Sig Start Date End Date Taking? Authorizing Provider  carvedilol (COREG) 12.5 MG tablet Take 1.5 tablets (18.75 mg total) by mouth 2 (two) times daily. 04/02/23 04/01/24 Yes Laurey Morale, MD  dabigatran (PRADAXA) 150 MG CAPS capsule Take 1 capsule (150 mg total) by mouth 2 (two) times daily. 04/02/23  Yes Laurey Morale, MD  doxepin (SINEQUAN) 75 MG capsule Take 75 mg by mouth at bedtime. 07/24/22  Yes [provider]  empagliflozin (JARDIANCE) 10 MG TABS tablet Take 1 tablet (10 mg total) by mouth daily before breakfast. 04/02/23  Yes Laurey Morale, MD  hydrOXYzine (ATARAX) 25 MG tablet Take 25 mg by mouth 3 (three) times daily as needed for anxiety. 11/21/22  Yes [provider]  mirtazapine  (REMERON) 45 MG tablet Take 45 mg by mouth at bedtime.   Yes [provider]  potassium chloride SA (KLOR-CON M) 20 MEQ tablet Take 2 tablets (40 mEq total) by mouth daily. 07/02/23  Yes Laurey Morale, MD  pravastatin (PRAVACHOL) 10 MG tablet Take 1 tablet (10 mg total) by mouth at bedtime. 04/02/23  Yes Laurey Morale, MD  QUEtiapine (SEROQUEL XR) 400 MG 24 hr tablet Take 800 mg by mouth at bedtime. 11/16/21  Yes [provider]  sacubitril-valsartan (ENTRESTO) 97-103 MG Take 1 tablet by mouth 2 (two) times daily. 04/02/23  Yes Laurey Morale, MD  spironolactone (ALDACTONE) 25 MG tablet Take 1 tablet (25 mg total) by mouth daily. 04/02/23 04/01/24 Yes Laurey Morale, MD  furosemide (LASIX) 40 MG tablet Take 1 tablet (40 mg total) by mouth as needed for fluid or edema (take if a weight gain of 3 lbs within 24 hours or 5 lbs in a week.). 04/02/23 07/01/23  Laurey Morale, MD    Family History Family History  Problem Relation Age of Onset  Hypertension Mother    Stroke Mother    Multiple sclerosis Sister    Down syndrome Son     Social History Social History   Tobacco Use   Smoking status: Never    Passive exposure: Past   Smokeless tobacco: Never  Vaping Use   Vaping status: Never Used  Substance Use Topics   Alcohol use: No   Drug use: No     Allergies   Lyrica [pregabalin]   Review of Systems Review of Systems  Neurological:  Positive for headaches.   Per HPI  Physical Exam Triage Vital Signs ED Triage Vitals  Encounter Vitals Group     BP 07/03/23 1206 128/86     Systolic BP Percentile --      Diastolic BP Percentile --      Pulse Rate 07/03/23 1206 88     Resp 07/03/23 1206 16     Temp 07/03/23 1206 98.1 F (36.7 C)     Temp Source 07/03/23 1206 Oral     SpO2 07/03/23 1206 94 %     Weight --      Height --      Head Circumference --      Peak Flow --      Pain Score 07/03/23 1213 0     Pain Loc --      Pain Education --       Exclude from Growth Chart --    No data found.  Updated Vital Signs BP 128/86 (BP Location: Left Arm)   Pulse 88   Temp 98.1 F (36.7 C) (Oral)   Resp 16   SpO2 94%   Visual Acuity Right Eye Distance:   Left Eye Distance:   Bilateral Distance:    Right Eye Near:   Left Eye Near:    Bilateral Near:     Physical Exam Vitals and nursing note reviewed.  Constitutional:      General: He is awake. He is not in acute distress.    Appearance: Normal appearance. He is well-developed and well-groomed. He is not ill-appearing.  HENT:     Head: Normocephalic.     Right Ear: Tympanic membrane, ear canal and external ear normal.     Left Ear: Tympanic membrane, ear canal and external ear normal.     Nose: Nose normal.  Eyes:     Extraocular Movements: Extraocular movements intact.     Conjunctiva/sclera: Conjunctivae normal.     Pupils: Pupils are equal, round, and reactive to light.  Musculoskeletal:        General: Normal range of motion.     Cervical back: Normal range of motion and neck supple.  Skin:    General: Skin is warm and dry.  Neurological:     General: No focal deficit present.     Mental Status: He is alert and oriented to person, place, and time. Mental status is at baseline.     GCS: GCS eye subscore is 4. GCS verbal subscore is 5. GCS motor subscore is 6.     Cranial Nerves: Cranial nerves 2-12 are intact.     Sensory: Sensation is intact.     Motor: Motor function is intact.     Coordination: Coordination is intact.     Gait: Gait is intact.  Psychiatric:        Behavior: Behavior is cooperative.      UC Treatments / Results  Labs (all labs ordered are listed, but only abnormal results are  displayed) Labs Reviewed - No data to display  EKG   Radiology No results found.  Procedures Procedures (including critical care time)  Medications Ordered in UC Medications  ketorolac (TORADOL) 30 MG/ML injection 15 mg (15 mg Intramuscular Given 07/03/23  1247)  dexamethasone (DECADRON) injection 10 mg (10 mg Intramuscular Given 07/03/23 1248)    Initial Impression / Assessment and Plan / UC Course  I have reviewed the triage vital signs and the nursing notes.  Pertinent labs & imaging results that were available during my care of the patient were reviewed by me and considered in my medical decision making (see chart for details).     Patient is well-appearing.  Vital stable.  No bruising noted on exam.  EOMI and PERRLA.  GCS 15.  No neurodeficits noted on exam.  Given low-dose Toradol and Decadron in clinic for bad headache.  Discussed bleeding risks and precautions.  Discussed taking Tylenol as needed for headache.  Discussed strict ER precautions. Final Clinical Impressions(s) / UC Diagnoses   Final diagnoses:  Bad headache     Discharge Instructions      You were given an injection of Toradol which is an anti-inflammatory and an injection of Decadron which is a steroid to help with inflammation causing your headache.  Toradol can increase your risk of bleeding considering you are currently on a blood thinner.  If you notice blood in stool, increased bruising, blood in urine, or bleeding from gums please seek immediate medical treatment in the emergency department.  Otherwise you can take 650 mg of Tylenol every 6-8 hours as needed for headache.  If your headache persists, worsens, or you develop blurred vision, weakness, numbness, confusion, or slurred speech please seek immediate medical treatment in the emergency department.    ED Prescriptions   None    PDMP not reviewed this encounter.   Levora Reas A, NP 07/03/23 [phone]

## 2023-07-03 NOTE — ED Triage Notes (Signed)
 Patient presents to office for headache and bruise on his forehead. Patient states he has been expose to mold in his apartment.

## 2023-07-03 NOTE — Discharge Instructions (Addendum)
 You were given an injection of Toradol which is an anti-inflammatory and an injection of Decadron which is a steroid to help with inflammation causing your headache.  Toradol can increase your risk of bleeding considering you are currently on a blood thinner.  If you notice blood in stool, increased bruising, blood in urine, or bleeding from gums please seek immediate medical treatment in the emergency department.  Otherwise you can take 650 mg of Tylenol every 6-8 hours as needed for headache.  If your headache persists, worsens, or you develop blurred vision, weakness, numbness, confusion, or slurred speech please seek immediate medical treatment in the emergency department.

## 2023-07-18 NOTE — Progress Notes (Signed)
 Remote ICD transmission.

## 2023-08-06 ENCOUNTER — Encounter (HOSPITAL_COMMUNITY): Payer: MEDICAID | Admitting: Cardiology

## 2023-08-13 ENCOUNTER — Emergency Department (HOSPITAL_BASED_OUTPATIENT_CLINIC_OR_DEPARTMENT_OTHER)
Admission: EM | Admit: 2023-08-13 | Discharge: 2023-08-13 | Disposition: A | Discharge status: Home / Self Care | Payer: MEDICAID | Attending: Emergency Medicine | Admitting: Emergency Medicine

## 2023-08-13 ENCOUNTER — Emergency Department (HOSPITAL_BASED_OUTPATIENT_CLINIC_OR_DEPARTMENT_OTHER): Payer: MEDICAID | Admitting: Radiology

## 2023-08-13 ENCOUNTER — Encounter (HOSPITAL_BASED_OUTPATIENT_CLINIC_OR_DEPARTMENT_OTHER): Payer: Self-pay | Admitting: Emergency Medicine

## 2023-08-13 ENCOUNTER — Other Ambulatory Visit: Payer: Self-pay

## 2023-08-13 DIAGNOSIS — S76312A Strain of muscle, fascia and tendon of the posterior muscle group at thigh level, left thigh, initial encounter: Secondary | ICD-10-CM | POA: Insufficient documentation

## 2023-08-13 DIAGNOSIS — W010XXA Fall on same level from slipping, tripping and stumbling without subsequent striking against object, initial encounter: Secondary | ICD-10-CM | POA: Diagnosis not present

## 2023-08-13 DIAGNOSIS — M79605 Pain in left leg: Secondary | ICD-10-CM | POA: Diagnosis present

## 2023-08-13 MED ORDER — OXYCODONE HCL 5 MG PO TABS: 5.0000 mg | ORAL_TABLET | ORAL | 0 refills | Status: DC | PRN

## 2023-08-13 NOTE — ED Provider Notes (Signed)
 [place] EMERGENCY DEPARTMENT AT [facility] Provider Note   CSN: [id number] Arrival date & time: 08/13/23  1857     History  Chief Complaint  Patient presents with   Justin [name] is a 58 y.o. male.  58 year old male presents today for a fall that occurred just prior to arrival.  He states there was slippery material on the sidewalk that caused him to fall.  He is on Pradaxa .  Denies any head injury.  Has been limping since.  No other complaints.  He feels he pulled a muscle.  The history is provided by the patient. No language interpreter was used.       Home Medications Prior to Admission medications   Medication Sig Start Date End Date Taking? Authorizing Provider  oxyCODONE  (ROXICODONE ) 5 MG immediate release tablet Take 1 tablet (5 mg total) by mouth every 4 (four) hours as needed for severe pain (pain score 7-10). 08/13/23  Yes Ceclia Cohens, Olive Motyka, PA-C  carvedilol  (COREG ) 12.5 MG tablet Take 1.5 tablets (18.75 mg total) by mouth 2 (two) times daily. 04/02/23 04/01/24  Darlis Eisenmenger, MD  dabigatran  (PRADAXA ) 150 MG CAPS capsule Take 1 capsule (150 mg total) by mouth 2 (two) times daily. 04/02/23   Darlis Eisenmenger, MD  doxepin  (SINEQUAN ) 75 MG capsule Take 75 mg by mouth at bedtime. 07/24/22   [provider]  empagliflozin  (JARDIANCE ) 10 MG TABS tablet Take 1 tablet (10 mg total) by mouth daily before breakfast. 04/02/23   Darlis Eisenmenger, MD  furosemide  (LASIX ) 40 MG tablet Take 1 tablet (40 mg total) by mouth as needed for fluid or edema (take if a weight gain of 3 lbs within 24 hours or 5 lbs in a week.). 04/02/23 07/01/23  Darlis Eisenmenger, MD  hydrOXYzine  (ATARAX ) 25 MG tablet Take 25 mg by mouth 3 (three) times daily as needed for anxiety. 11/21/22   [provider]  mirtazapine  (REMERON ) 45 MG tablet Take 45 mg by mouth at bedtime.    [provider]  potassium chloride  SA (KLOR-CON  M) 20 MEQ tablet Take 2 tablets (40 mEq total) by  mouth daily. 07/02/23   Darlis Eisenmenger, MD  pravastatin  (PRAVACHOL ) 10 MG tablet Take 1 tablet (10 mg total) by mouth at bedtime. 04/02/23   Darlis Eisenmenger, MD  QUEtiapine  (SEROQUEL  XR) 400 MG 24 hr tablet Take 800 mg by mouth at bedtime. 11/16/21   [provider]  sacubitril -valsartan  (ENTRESTO ) 97-103 MG Take 1 tablet by mouth 2 (two) times daily. 04/02/23   Darlis Eisenmenger, MD  spironolactone  (ALDACTONE ) 25 MG tablet Take 1 tablet (25 mg total) by mouth daily. 04/02/23 04/01/24  Darlis Eisenmenger, MD      Allergies    Lyrica [pregabalin]    Review of Systems   Review of Systems  Constitutional:  Negative for fever.  Musculoskeletal:  Positive for myalgias.  Neurological:  Negative for syncope.  All other systems reviewed and are negative.   Physical Exam Updated Vital Signs BP 135/88 (BP Location: Right Arm)   Pulse 85   Temp 98 F (36.7 C) (Oral)   Resp 18   Ht 5\' 9"  (1.753 m)   Wt 104.3 kg   SpO2 94%   BMI 33.97 kg/m  Physical Exam Vitals and nursing note reviewed.  Constitutional:      General: He is not in acute distress.    Appearance: Normal appearance. He is not ill-appearing.  HENT:  Head: Normocephalic and atraumatic.     Nose: Nose normal.  Eyes:     Conjunctiva/sclera: Conjunctivae normal.  Pulmonary:     Effort: Pulmonary effort is normal. No respiratory distress.  Musculoskeletal:        General: No deformity.     Comments: Good range of motion bilateral lower extremities.  Ambulates with an antalgic gait.  Neurovascularly intact bilateral lower extremities.  No hematoma or other acute concerns.  Skin:    Findings: No rash.  Neurological:     Mental Status: He is alert.     ED Results / Procedures / Treatments   Labs (all labs ordered are listed, but only abnormal results are displayed) Labs Reviewed - No data to display  EKG None  Radiology DG Femur Min 2 Views Left Result Date: 08/13/2023 CLINICAL DATA:  Pain.  Slip and fall.  EXAM: LEFT FEMUR 2 VIEWS COMPARISON:  None Available. FINDINGS: There is no evidence of fracture or other focal bone lesions. The cortical margins of the femur are intact. Hip and knee alignment are maintained. Soft tissues are unremarkable. IMPRESSION: Negative radiographs of the left femur. Electronically Signed   By: Chadwick Colonel M.D.   On: 08/13/2023 19:59    Procedures Procedures    Medications Ordered in ED Medications - No data to display  ED Course/ Medical Decision Making/ A&P                                 Medical Decision Making Amount and/or Complexity of Data Reviewed Radiology: ordered.  Risk Prescription drug management.   58 year old male presents today for concern of left leg pain after a fall.  Neurovascularly intact.  Exam overall reassuring.  Likely a muscle strain.  Knee immobilizer, crutches given.  Return precaution discussed.  Oxycodone  given for short course.  Referral to orthopedist given.  Patient stable for discharge.  Discharged in stable condition.   Final Clinical Impression(s) / ED Diagnoses Final diagnoses:  Strain of left hamstring muscle, initial encounter    Rx / DC Orders ED Discharge Orders          Ordered    oxyCODONE  (ROXICODONE ) 5 MG immediate release tablet  Every 4 hours PRN        08/13/23 2114              Lucina Sabal, PA-C 08/13/23 2121    Lowery Rue, DO 08/13/23 2152

## 2023-08-13 NOTE — ED Triage Notes (Signed)
 Pt via POV after a slip and fall on thinners. Pt takes pradaxa . He says he felt something pull when he landed and now has pain in his left posterior leg. Denies hitting head, no LOC. Pt ambulatory to triage.

## 2023-08-13 NOTE — Discharge Instructions (Addendum)
 No concerning findings on x-ray.  You likely have a muscle strain.  This does take a while to heal.  Exam is overall reassuring.  I have given you a knee immobilizer and crutches to offload this leg.  You can bear weight as you can tolerate.  Follow-up with your orthopedist.  Return for any emergent symptoms. Pain medicine will make you drowsy.  Do not drive or do anything else dangerous after taking this medicine.  Primarily take 1000 mg of Tylenol  every 6 hours.  Use warm compress to help relax the muscles.  You can also use cold compress.  Specially if you notice that hematoma cold compress would work better.

## 2023-09-03 ENCOUNTER — Emergency Department (HOSPITAL_BASED_OUTPATIENT_CLINIC_OR_DEPARTMENT_OTHER): Payer: MEDICAID | Admitting: Radiology

## 2023-09-03 ENCOUNTER — Encounter (HOSPITAL_BASED_OUTPATIENT_CLINIC_OR_DEPARTMENT_OTHER): Payer: Self-pay | Admitting: Emergency Medicine

## 2023-09-03 ENCOUNTER — Emergency Department (HOSPITAL_BASED_OUTPATIENT_CLINIC_OR_DEPARTMENT_OTHER): Payer: MEDICAID

## 2023-09-03 ENCOUNTER — Other Ambulatory Visit: Payer: Self-pay

## 2023-09-03 ENCOUNTER — Emergency Department (HOSPITAL_BASED_OUTPATIENT_CLINIC_OR_DEPARTMENT_OTHER)
Admission: EM | Admit: 2023-09-03 | Discharge: 2023-09-03 | Disposition: A | Discharge status: Home / Self Care | Payer: MEDICAID | Source: Home / Self Care | Attending: Emergency Medicine | Admitting: Emergency Medicine

## 2023-09-03 DIAGNOSIS — Z79899 Other long term (current) drug therapy: Secondary | ICD-10-CM | POA: Insufficient documentation

## 2023-09-03 DIAGNOSIS — R202 Paresthesia of skin: Secondary | ICD-10-CM | POA: Diagnosis not present

## 2023-09-03 DIAGNOSIS — M25572 Pain in left ankle and joints of left foot: Secondary | ICD-10-CM | POA: Insufficient documentation

## 2023-09-03 DIAGNOSIS — I110 Hypertensive heart disease with heart failure: Secondary | ICD-10-CM | POA: Insufficient documentation

## 2023-09-03 DIAGNOSIS — M25472 Effusion, left ankle: Secondary | ICD-10-CM | POA: Diagnosis present

## 2023-09-03 DIAGNOSIS — R200 Anesthesia of skin: Secondary | ICD-10-CM | POA: Insufficient documentation

## 2023-09-03 DIAGNOSIS — I509 Heart failure, unspecified: Secondary | ICD-10-CM | POA: Insufficient documentation

## 2023-09-03 MED ORDER — ACETAMINOPHEN 325 MG PO TABS
650.0000 mg | ORAL_TABLET | Freq: Once | ORAL | Status: AC
  Administered 2023-09-03: 650 mg via ORAL
  Filled 2023-09-03: qty 2

## 2023-09-03 MED ORDER — METHYLPREDNISOLONE 4 MG PO TBPK: ORAL_TABLET | ORAL | 0 refills | Status: DC

## 2023-09-03 NOTE — Discharge Instructions (Signed)
 As discussed, your workup today was reassuring.  Ultrasound of your leg showed no clot or DVT.  X-ray did not show any obvious bony abnormality.  You have great pulses to your foot so do not think this is related to decreased blood flow.  It does not appear infectious either.  Will treat your symptoms with a an ankle brace as well as a Medrol  Dosepak.  Recommend using your crutches at home to offload the foot/ankle.  Recommend follow-up with your primary care for reassessment.  Please do not hesitate to return to the emergency department if the worrisome signs and symptoms we discussed become apparent.

## 2023-09-03 NOTE — ED Triage Notes (Signed)
 Left leg from midcalf to toes  Pain/ numb + palp pulse,  Woke up this AM with symptoms

## 2023-09-03 NOTE — ED Provider Notes (Signed)
 [place] EMERGENCY DEPARTMENT AT [facility] Provider Note   CSN: [id number] Arrival date & time: 09/03/23  1532     Patient presents with: Foot Pain   Justin [name] is a 58 y.o. male.    Foot Pain     58 year old male presents emergency department with complaints of left ankle swelling, pain, tingling sensation of left foot.  States that he woke up this morning with swelling to his left ankle.  When he went to step on his left foot, noticed tingling sensation in his toes and had pain in his left ankle.  States he does notice some pain going up to the bottom part of his left calf with some swelling there to swell.  Denies any known injury//fall/traumas.  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  Denies any new disturbance, gait abnormality, slurred speech, facial droop, weakness or sensory deficits in upper or lower extremities otherwise.  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 Authorizing Provider  carvedilol  (COREG ) 12.5 MG tablet Take 1.5 tablets (18.75 mg total) by mouth 2 (two) times daily. 04/02/23 04/01/24  Darlis Eisenmenger, MD  dabigatran  (PRADAXA ) 150 MG CAPS capsule Take 1 capsule (150 mg total) by mouth 2 (two) times daily. 04/02/23   Darlis Eisenmenger, MD  doxepin  (SINEQUAN ) 75 MG capsule Take 75 mg by mouth at bedtime. 07/24/22   [provider]  empagliflozin  (JARDIANCE ) 10 MG TABS tablet Take 1 tablet (10 mg total) by mouth daily before breakfast. 04/02/23   Darlis Eisenmenger, MD  furosemide  (LASIX ) 40 MG tablet Take 1 tablet (40 mg total) by mouth as needed for fluid or edema (take if a weight gain of 3 lbs within 24 hours or 5 lbs in a week.). 04/02/23  07/01/23  Darlis Eisenmenger, MD  hydrOXYzine  (ATARAX ) 25 MG tablet Take 25 mg by mouth 3 (three) times daily as needed for anxiety. 11/21/22   [provider]  mirtazapine  (REMERON ) 45 MG tablet Take 45 mg by mouth at bedtime.    [provider]  oxyCODONE  (ROXICODONE ) 5 MG immediate release tablet Take 1 tablet (5 mg total) by mouth every 4 (four) hours as needed for severe pain (pain score 7-10). 08/13/23   Lucina Sabal, PA-C  potassium chloride  SA (KLOR-CON  M) 20 MEQ tablet Take 2 tablets (40 mEq total) by mouth daily. 07/02/23   Darlis Eisenmenger, MD  pravastatin  (PRAVACHOL ) 10 MG tablet Take 1 tablet (10 mg total) by mouth at bedtime. 04/02/23   Darlis Eisenmenger, MD  QUEtiapine  (SEROQUEL  XR) 400 MG 24 hr tablet Take 800 mg by mouth at bedtime. 11/16/21   [provider]  sacubitril -valsartan  (ENTRESTO ) 97-103 MG Take 1 tablet by mouth 2 (two) times daily. 04/02/23   Darlis Eisenmenger, MD  spironolactone  (ALDACTONE ) 25 MG tablet Take 1 tablet (25 mg total) by mouth daily. 04/02/23 04/01/24  Darlis Eisenmenger, MD    Allergies: Lyrica [pregabalin]    Review of Systems  All other systems reviewed and are negative.   Updated Vital Signs BP (!) 135/93 (BP Location: Right Arm)   Pulse 92   Temp 98 F (36.7 C)   Resp 16   SpO2 95%   Physical Exam Vitals and nursing note  reviewed.  Constitutional:      General: He is not in acute distress.    Appearance: He is well-developed.  HENT:     Head: Normocephalic and atraumatic.   Eyes:     Conjunctiva/sclera: Conjunctivae normal.    Cardiovascular:     Rate and Rhythm: Normal rate and regular rhythm.     Heart sounds: No murmur heard. Pulmonary:     Effort: Pulmonary effort is normal. No respiratory distress.     Breath sounds: Normal breath sounds.  Abdominal:     Palpations: Abdomen is soft.     Tenderness: There is no abdominal tenderness.   Musculoskeletal:        General: No swelling.     Cervical back: Neck  supple.     Comments: Full range of motion of left ankle with pain elicited with dorsiflexion.  Tenderness over left deltoid ligament area with overlying swelling.  No obvious pitting edema of left lower extremity.  Pedal and posttibial tibial pulses 2+ bilaterally.  No overlying erythema, palpable chest induration.  Patient was with decreased sensation on dorsal aspect of foot from about mid forefoot distally as well as plantarly in similar area.  Cap refill of digits less than 2 seconds.   Skin:    General: Skin is warm and dry.     Capillary Refill: Capillary refill takes less than 2 seconds.   Neurological:     Mental Status: He is alert.   Psychiatric:        Mood and Affect: Mood normal.     (all labs ordered are listed, but only abnormal results are displayed) Labs Reviewed - No data to display  EKG: None  Radiology: No results found.   Procedures   Medications Ordered in the ED - No data to display                                  Medical Decision Making Amount and/or Complexity of Data Reviewed Radiology: ordered.  Risk OTC drugs. Prescription drug management.   This patient presents to the ED for concern of left ankle pain/swelling/tingling, this involves an extensive number of treatment options, and is a complaint that carries with it a high risk of complications and morbidity.  The differential diagnosis includes fracture, strain/pain, dislocation, ligament/tendon injury, gastric abnormalities, DVT, ischemic limb, septic arthritis, cellulitis, necrotizing infection, other  Co morbidities that complicate the patient evaluation  See HPI   Additional history obtained:  Additional history obtained from EMR External records from outside source obtained and reviewed including hospital records   Lab Tests:  N/a   Imaging Studies ordered:  I ordered imaging studies including left ankle x-ray, left DVT I independently visualized and interpreted imaging  which showed  Left ankle x-ray: No abnormality Left DVT: No acute abnormality I agree with the radiologist interpretation   Cardiac Monitoring: / EKG:  The patient was maintained on a cardiac monitor.  I personally viewed and interpreted the cardiac monitored which showed an underlying rhythm of: Sinus rhythm   Consultations Obtained:  N/a   Problem List / ED Course / Critical interventions / Medication management  Left ankle pain I ordered medication including Tylenol   Reevaluation of the patient after these medicines showed that the patient improved I have reviewed the patients home medicines and have made adjustments as needed   Social Determinants of Health:  Denies tobacco,'s or illicit drug use.  Test / Admission - Considered:  Left ankle pain/swelling/tingling of left foot Vitals signs within normal range and stable throughout visit. Imaging studies significant for: See above 58 year old male presents emergency department with complaints of left ankle swelling, pain, tingling sensation of left foot.  States that he woke up this morning with swelling to his left ankle.  When he went to step on his left foot, noticed tingling sensation in his toes and had pain in his left ankle.  States he does notice some pain going up to the bottom part of his left calf with some swelling there to swell.  Denies any known injury//fall/traumas.  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  Denies any new disturbance, gait abnormality, slurred speech, facial droop, weakness or sensory deficits in upper or lower extremities otherwise. On exam, swelling to left ankle with tenderness over her deltoid ligament is present.  No pulse deficits to suggest ischemic limb.  No overlying skin changes concerning for secondary infectious process.  Decree sensation present in forefoot as above.  DVT ultrasound was obtained given patient's extensive history  of DVT/PE which was negative for DVT.  X-ray performed of left ankle was negative for any fracture or dislocation.  Query possible ankle sprain despite not having reported mechanism of injury given where patient is tender with localized area of swelling.  Will place patient in an ankle ASO lace up brace.  Has crutches at home to help offload foot/ankle.  Will trial Medrol  Dosepak given patient's chronic anticoagulation on Pradaxa  so we will avoid NSAIDs.  Will recommend close follow-up with PCP in the outpatient setting for reassessment.  Treatment plan discussed with patient and he was understanding was agreeable to said plan.  Patient overall well-appearing, afebrile in no acute distress. Worrisome signs and symptoms were discussed with the patient, and the patient acknowledged understanding to return to the ED if noticed. Patient was stable upon discharge.       Final diagnoses:  None    ED Discharge Orders     None          Edna Butter, [place] 09/03/23 1729    Tonya Fredrickson, MD 09/04/23 1049

## 2023-09-08 ENCOUNTER — Encounter (HOSPITAL_COMMUNITY): Payer: Self-pay

## 2023-09-08 ENCOUNTER — Ambulatory Visit (HOSPITAL_COMMUNITY)
Admission: EM | Admit: 2023-09-08 | Discharge: 2023-09-08 | Disposition: A | Discharge status: Home / Self Care | Payer: MEDICAID | Attending: Emergency Medicine | Admitting: Emergency Medicine

## 2023-09-08 DIAGNOSIS — M5441 Lumbago with sciatica, right side: Secondary | ICD-10-CM

## 2023-09-08 MED ORDER — BACLOFEN 10 MG PO TABS: 10.0000 mg | ORAL_TABLET | Freq: Three times a day (TID) | ORAL | 0 refills | Status: DC

## 2023-09-08 MED ORDER — PREDNISONE 20 MG PO TABS: 20.0000 mg | ORAL_TABLET | Freq: Every day | ORAL | 0 refills | Status: AC

## 2023-09-08 NOTE — Discharge Instructions (Signed)
 Take 1 tablet of prednisone  once daily for 5 days to help with inflammation causing your pain You can take 1 tablet of baclofen every 8 hours as needed for additional pain relief.  Otherwise alternate between ice and heat as needed for pain and do some gentle stretching to avoid becoming stiff. I have attached [facility] that you can follow-up with if your pain continues Follow-up with your primary care provider or return here as needed.

## 2023-09-08 NOTE — ED Triage Notes (Signed)
 Patient here today with c/o LB pain X 3 days. Pain radiates down right leg. Increase pain when going from sitting to standing.

## 2023-09-08 NOTE — ED Provider Notes (Signed)
 [facility]    CSN: [id number] Arrival date & time: 09/08/23  1350      History   Chief Complaint Chief Complaint  Patient presents with   Back Pain    HPI Justin [name] is a 58 y.o. male.   Patient presents with right sided low back pain x 3 days.  Patient states that pain radiates down his right leg.  Patient states that the pain increases when he goes from a sitting to standing position.  Patient denies any recent falls or injuries.  Patient denies history of back pain or sciatica. Patient denies taking anything for pain.  The history is provided by the patient and medical records.  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   Painful orthopaedic hardware (HCC) 01/22/2023   Pain in right leg 08/06/2022   Fatigue 07/31/2022   History of DVT (deep vein thrombosis) 07/17/2022   Tremors of nervous system 07/17/2022   Nonischemic cardiomyopathy (HCC) 05/18/2022   HFrEF (heart failure with reduced ejection fraction) (HCC) 05/18/2022   Snoring 04/04/2022   Normocytic anemia 12/22/2021   History of pulmonary embolism 10/19/2021   Status post lumbar spinal fusion 01/03/2018   Lumbar stenosis 12/23/2017   Spondylolisthesis, lumbar region 06/25/2017   Achilles tendinitis of left lower extremity 12/07/2016   Achilles tendinitis, left leg 05/17/2016    Obstructive sleep apnea on CPAP 03/07/2015   Achilles rupture, left 10/21/2014   Bipolar disorder, unspecified (HCC) 01/07/2013   Headache 01/07/2013   Chronic anticoagulation 01/04/2013    Past Surgical History:  Procedure Laterality Date   ACHILLES TENDON SURGERY Left 10/21/2014   Procedure: Left Achilles Reconstruction;  Surgeon: Jerona Harden GAILS, MD;  Location: [facility] OR;  Service: Orthopedics;  Laterality: Left;   APPENDECTOMY     CARDIAC CATHETERIZATION  03/04/2018   [facility]: Normal coronaries, LVEF estimated at 40%, medical Rx   CRANIOTOMY N/A 01/19/2013   Procedure: CRANIOTOMY HEMATOMA EVACUATION SUBDURAL;  Surgeon: Lamar LELON Peaches, MD;  Location: [facility];  Service: Neurosurgery;  Laterality: N/A;   CYSTECTOMY     right head   ELBOW SURGERY     right   FRACTURE SURGERY Right    finger- 1st digit right hand   HARDWARE REMOVAL Right 01/22/2023   Procedure: REMOVAL OF RIGHT LUMBAR FIVE-SACRAL ONE HARDWARE;  Surgeon: Colon Shove, MD;  Location: [facility] OR;  Service: Neurosurgery;  Laterality: Right;   I & D EXTREMITY Left 12/07/2016   Procedure: LEFT ACHILLES DEBRIDEMENT;  Surgeon: Harden Jerona GAILS, MD;  Location: [facility] OR;  Service: Orthopedics;  Laterality: Left;   LIPOMA EXCISION Left 01/22/2023   Procedure: RESECTION OF LIPOMA;  Surgeon: Colon Shove, MD;  Location: [facility] OR;  Service: Neurosurgery;  Laterality: Left;   LUMBAR FUSION  12/23/2017   L5 GILL PROCEDURE,  RIGHT L5-S1, TRANSFORAMIAL LUMBAR INTERBODY FUSION, BILATERAL LATERAL FUSION, PEDICLE INSTRUMENTATION   MULTIPLE EXTRACTIONS WITH ALVEOLOPLASTY N/A 03/07/2015   Procedure: MULTIPLE EXTRACTION WITH ALVEOLOPLASTY;  Surgeon: Glendia Primrose, DDS;  Location: [facility] OR;  Service: Oral Surgery;  Laterality: N/A;   RIGHT/LEFT HEART CATH AND CORONARY ANGIOGRAPHY N/A 05/18/2022   Procedure: RIGHT/LEFT HEART CATH AND CORONARY ANGIOGRAPHY;  Surgeon: Rolan Ezra RAMAN, MD;  Location: [facility] INVASIVE CV LAB;  Service: Cardiovascular;   Laterality: N/A;   SPINAL CORD STIMULATOR INSERTION N/A 05/18/2019   Procedure: LUMBAR SPINAL CORD STIMULATOR INSERTION;  Surgeon: Mindi Mt, MD;  Location: [facility] OR;  Service: Neurosurgery;  Laterality: N/A;  Thoracic/Lumbar   SPINAL CORD STIMULATOR INSERTION N/A 04/06/2020   Procedure: Revision of spinal cord stimulator;  Surgeon: Darlis Deatrice RAMAN, MD;  Location: [facility] OR;  Service: Neurosurgery;  Laterality: N/A;   SPINAL CORD STIMULATOR REMOVAL N/A 10/30/2022   Procedure: SCS REVISION OF GENERATOR;  Surgeon: Colon Shove, MD;  Location: [facility] OR;  Service: Neurosurgery;  Laterality: N/A;  3C   SUBQ ICD IMPLANT N/A 03/01/2023   Procedure: SUBQ ICD IMPLANT;  Surgeon: Cindie Ole DASEN, MD;  Location: [facility] INVASIVE CV LAB;  Service: Cardiovascular;  Laterality: N/A;   TEE WITHOUT CARDIOVERSION N/A 05/18/2022   Procedure: TRANSESOPHAGEAL ECHOCARDIOGRAM (TEE);  Surgeon: Rolan Ezra RAMAN, MD;  Location: [facility] ENDOSCOPY;  Service: Cardiovascular;  Laterality: N/A;       Home Medications    Prior to Admission medications   Medication Sig Start Date End Date Taking? Authorizing Provider  baclofen (LIORESAL) 10 MG tablet Take 1 tablet (10 mg total) by mouth 3 (three) times daily. 09/08/23  Yes Johnie, Chanah Tidmore A, NP  predniSONE  (DELTASONE ) 20 MG tablet Take 1 tablet (20 mg total) by mouth daily for 5 days. 09/08/23 09/13/23 Yes Catlynn Grondahl A, NP  carvedilol  (COREG ) 12.5 MG tablet Take 1.5 tablets (18.75 mg total) by mouth 2 (two) times daily. 04/02/23 04/01/24  Rolan Ezra RAMAN, MD  dabigatran  (PRADAXA ) 150 MG CAPS capsule Take 1 capsule (150 mg total) by mouth 2 (two) times daily. 04/02/23   Rolan Ezra RAMAN, MD  doxepin  (SINEQUAN ) 75 MG capsule Take 75 mg by mouth at bedtime. 07/24/22   [provider]  empagliflozin  (JARDIANCE ) 10 MG TABS tablet Take 1 tablet (10 mg total) by mouth daily before breakfast. 04/02/23   Rolan Ezra RAMAN, MD  furosemide  (LASIX ) 40 MG tablet Take 1 tablet (40 mg total)  by mouth as needed for fluid or edema (take if a weight gain of 3 lbs within 24 hours or 5 lbs in a week.). Patient not taking: Reported on 09/08/2023 04/02/23 07/01/23  Rolan Ezra RAMAN, MD  hydrOXYzine  (ATARAX ) 25 MG tablet Take 25 mg by mouth 3 (three) times daily as needed for anxiety. 11/21/22   [provider]  mirtazapine  (REMERON ) 45 MG tablet Take 45 mg by mouth at bedtime.    [provider]  pravastatin  (PRAVACHOL ) 10 MG tablet Take 1 tablet (10 mg total) by mouth at bedtime. 04/02/23   Rolan Ezra RAMAN, MD  QUEtiapine  (SEROQUEL  XR) 400 MG 24 hr tablet Take 800 mg by mouth at bedtime. 11/16/21   [provider]  sacubitril -valsartan  (ENTRESTO ) 97-103 MG Take 1 tablet by mouth 2 (two) times daily. 04/02/23   Rolan Ezra RAMAN, MD    Family History Family History  Problem Relation Age of Onset   Hypertension Mother    Stroke Mother    Multiple sclerosis Sister  Down syndrome Son     Social History Social History   Tobacco Use   Smoking status: Never    Passive exposure: Past   Smokeless tobacco: Never  Vaping Use   Vaping status: Never Used  Substance Use Topics   Alcohol use: No   Drug use: No     Allergies   Lyrica [pregabalin]   Review of Systems Review of Systems  Musculoskeletal:  Positive for back pain.   Per HPI  Physical Exam Triage Vital Signs ED Triage Vitals  Encounter Vitals Group     BP 09/08/23 1415 113/79     Girls Systolic BP Percentile --      Girls Diastolic BP Percentile --      Boys Systolic BP Percentile --      Boys Diastolic BP Percentile --      Pulse Rate 09/08/23 1415 95     Resp --      Temp 09/08/23 1415 98.6 F (37 C)     Temp Source 09/08/23 1415 Oral     SpO2 09/08/23 1415 95 %     Weight --      Height --      Head Circumference --      Peak Flow --      Pain Score 09/08/23 1418 9     Pain Loc --      Pain Education --      Exclude from Growth Chart --    No data found.  Updated Vital  Signs BP 113/79 (BP Location: Right Arm)   Pulse 95   Temp 98.6 F (37 C) (Oral)   SpO2 95%   Visual Acuity Right Eye Distance:   Left Eye Distance:   Bilateral Distance:    Right Eye Near:   Left Eye Near:    Bilateral Near:     Physical Exam Vitals and nursing note reviewed.  Constitutional:      General: He is awake. He is not in acute distress.    Appearance: Normal appearance. He is well-developed and well-groomed. He is not ill-appearing.   Musculoskeletal:     Cervical back: Normal.     Thoracic back: Normal.     Lumbar back: Tenderness present. No swelling, edema, deformity, spasms or bony tenderness. Normal range of motion. Positive right straight leg raise test.       Back:   Skin:    General: Skin is warm and dry.   Neurological:     Mental Status: He is alert.   Psychiatric:        Behavior: Behavior is cooperative.      UC Treatments / Results  Labs (all labs ordered are listed, but only abnormal results are displayed) Labs Reviewed - No data to display  EKG   Radiology No results found.  Procedures Procedures (including critical care time)  Medications Ordered in UC Medications - No data to display  Initial Impression / Assessment and Plan / UC Course  I have reviewed the triage vital signs and the nursing notes.  Pertinent labs & imaging results that were available during my care of the patient were reviewed by me and considered in my medical decision making (see chart for details).     Patient is well-appearing.  Vitals are stable.  Upon assessment tenderness noted to right low back.  Positive right straight leg raise test noted on exam.  Patient is amatory without difficulty.  Without decreased range of motion, swelling, or obvious  deformity noted.  Prescribed short course of low-dose prednisone  burst due to patient being on blood thinners.  Prescribed baclofen as needed for muscle pain and spasms.  Recommended alternate between ice  and heat as needed.  Given information for orthopedic follow-up.  Discussed follow-up and return precautions. Final Clinical Impressions(s) / UC Diagnoses   Final diagnoses:  Acute right-sided low back pain with right-sided sciatica     Discharge Instructions      Take 1 tablet of prednisone  once daily for 5 days to help with inflammation causing your pain You can take 1 tablet of baclofen every 8 hours as needed for additional pain relief.  Otherwise alternate between ice and heat as needed for pain and do some gentle stretching to avoid becoming stiff. I have attached [facility] that you can follow-up with if your pain continues Follow-up with your primary care provider or return here as needed.    ED Prescriptions     Medication Sig Dispense Auth. Provider   baclofen (LIORESAL) 10 MG tablet Take 1 tablet (10 mg total) by mouth 3 (three) times daily. 30 each Johnie Flaming A, NP   predniSONE  (DELTASONE ) 20 MG tablet Take 1 tablet (20 mg total) by mouth daily for 5 days. 5 tablet Johnie Flaming A, NP      PDMP not reviewed this encounter.   Johnie Flaming A, NP 09/08/23 [phone]

## 2023-09-13 IMAGING — XA DG MYELOGRAPHY LUMBAR INJ LUMBOSACRAL
6 of 12 series · 6 of 12 positions shown · non-contrast
Comparison: 01/05/2021 CT. Radiography 04/06/2020.  CT 01/19/2020.

CLINICAL DATA: Previous L5-S1 fusion. Development of severe pain
radiating down the right leg.
TECHNIQUE: Contiguous axial images were obtained through the Lumbar spine after
the intrathecal infusion of infusion. Coronal and sagittal
reconstructions were obtained of the axial image sets.

[Series 1: w lumbar spine lat · 0.15mm/px · 1 of 1 slices shown]
[im 1/1]
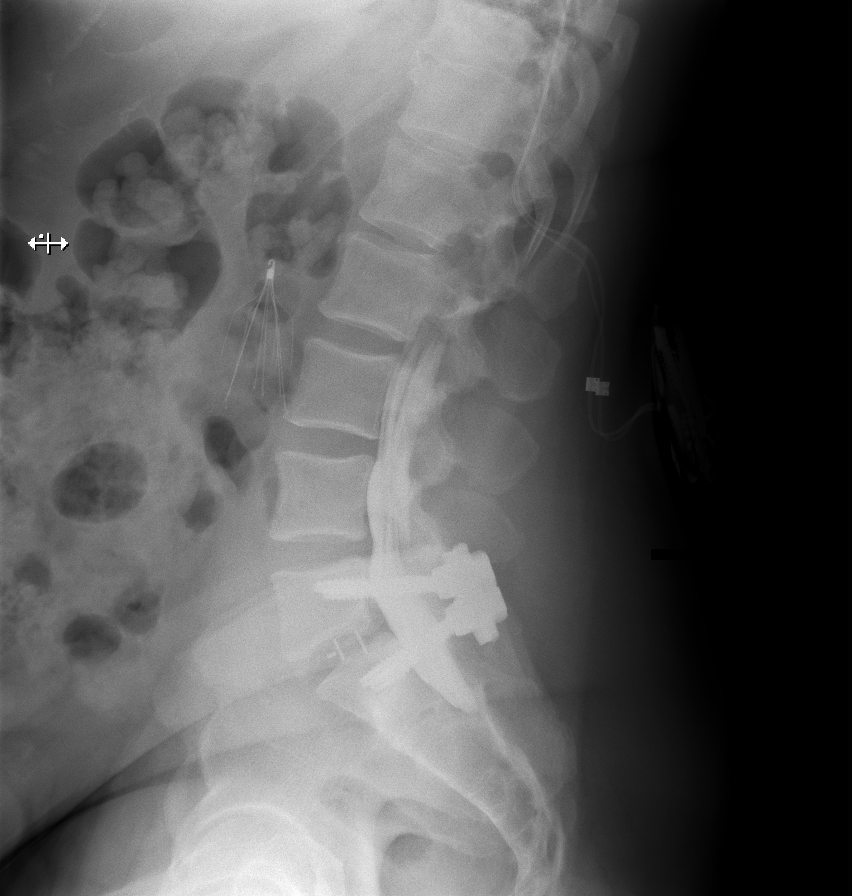

[Series 1: vasc standard · 1 of 1 slices shown (1 of 3)]
[im 1/1]
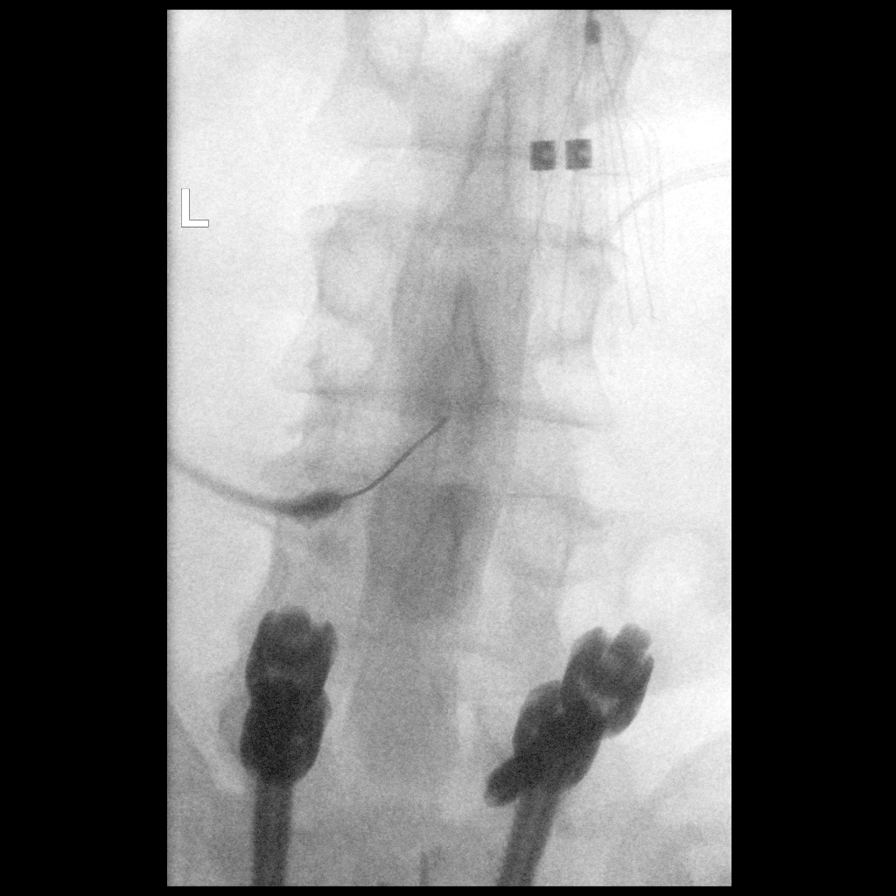

[Series 2: vasc standard · 1 of 1 slices shown (2 of 3)]
[im 1/1]
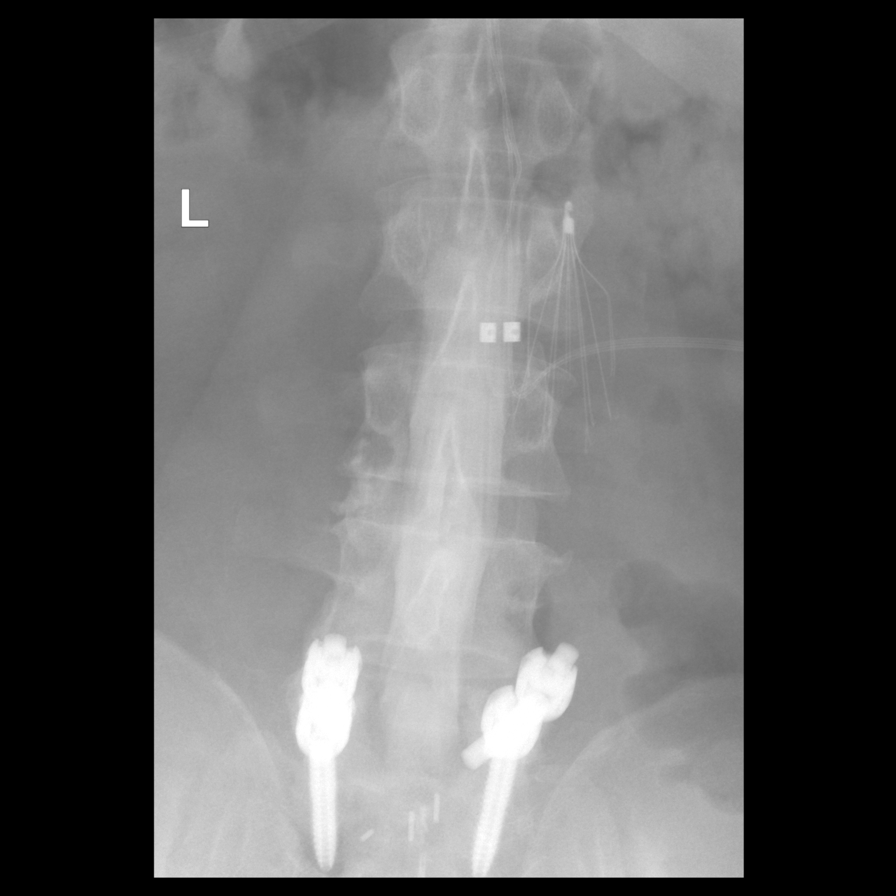

[Series 2: w lumbar spine flexion · 0.15mm/px · 1 of 1 slices shown]
[im 1/1]
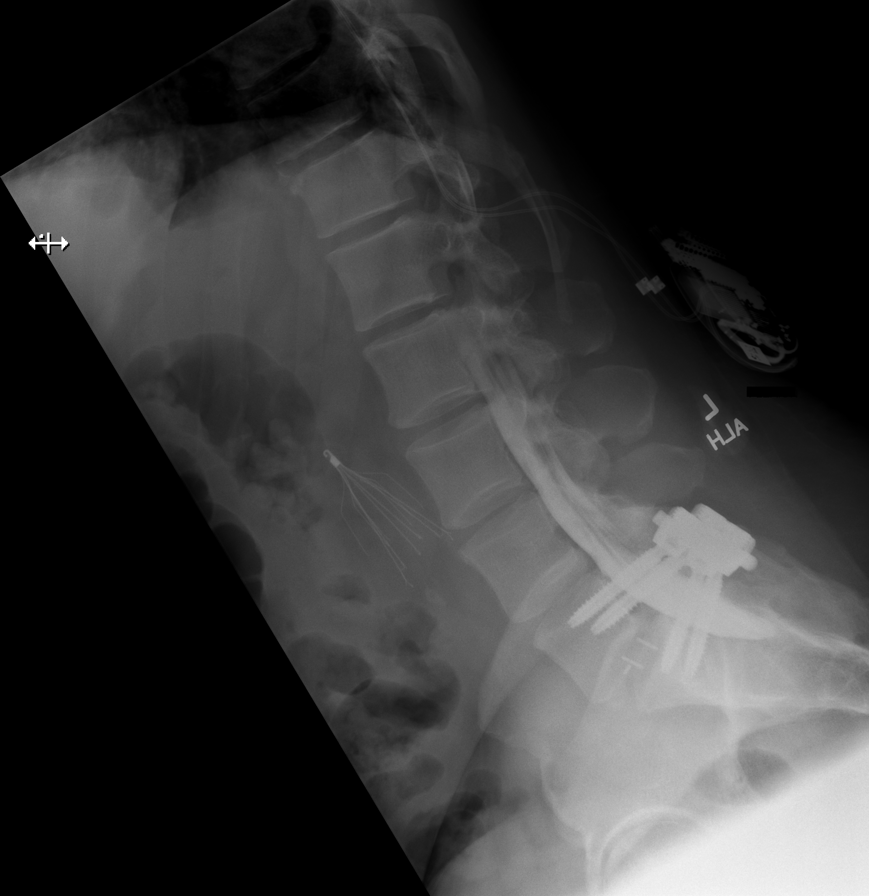

[Series 3: w lumbar spine extension · 0.15mm/px · 1 of 1 slices shown]
[im 1/1]
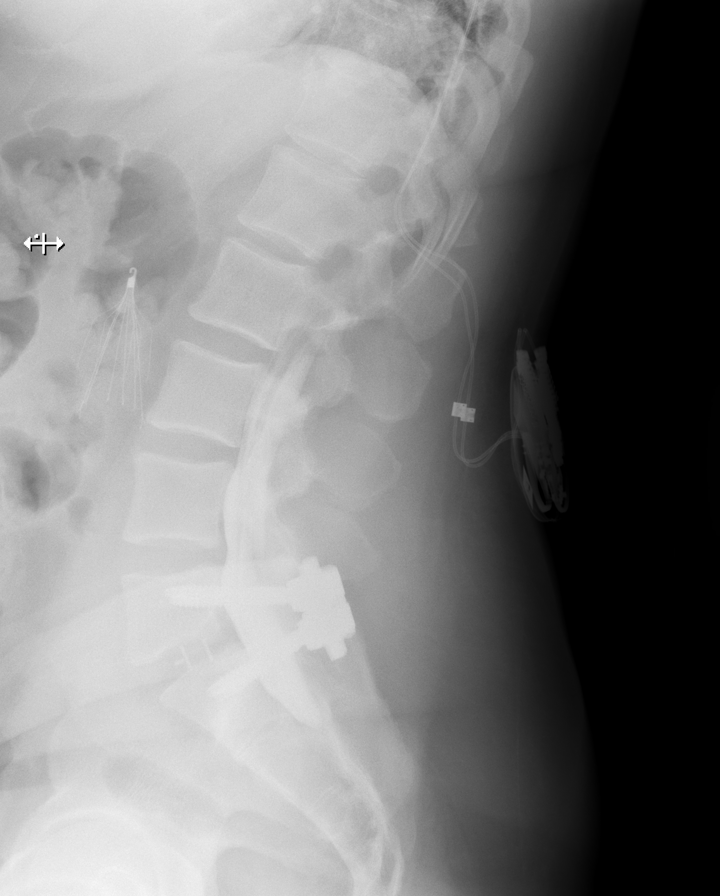

[Series 3: vasc standard · 1 of 1 slices shown (3 of 3)]
[im 1/1]
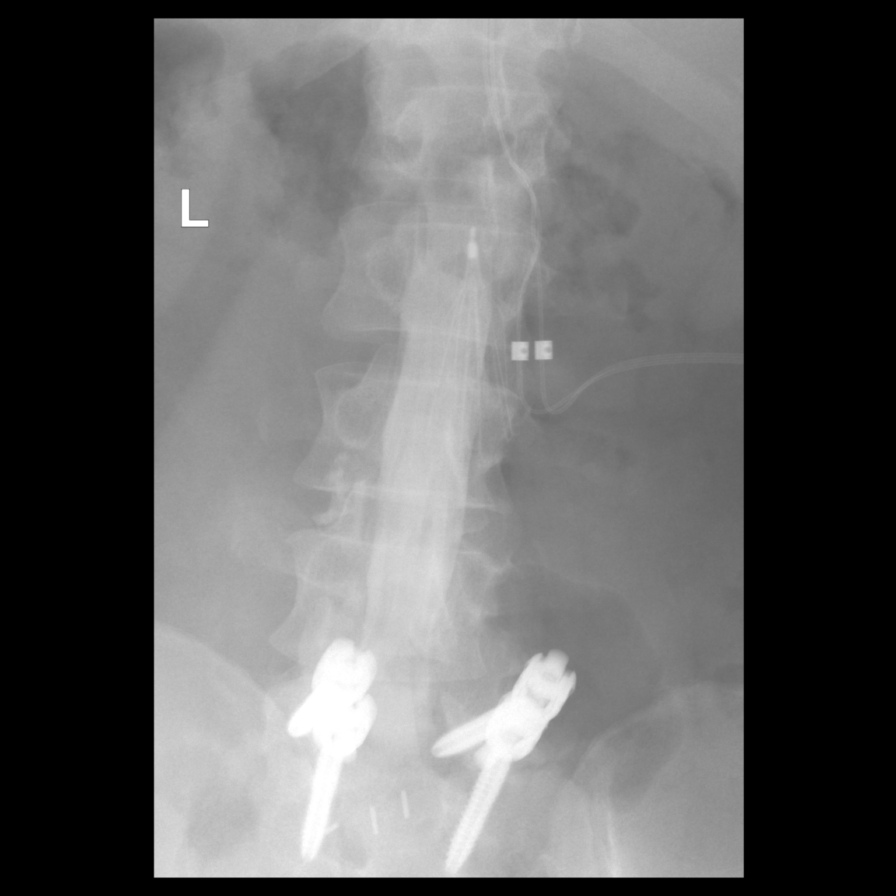

[6 of 12 positions shown; findings below may reference images not displayed]

EXAM:
LUMBAR MYELOGRAM

FLUOROSCOPY:
1 minute 25 seconds.  330.63 micro gray meter squared

PROCEDURE:
After thorough discussion of risks and benefits of the procedure
including bleeding, infection, injury to nerves, blood vessels,
adjacent structures as well as headache and CSF leak, written and
oral informed consent was obtained. Consent was obtained by Dr. Mondaine
Balbuena. Time out form was completed.

Patient was positioned prone on the fluoroscopy table. Local
anesthesia was provided with 1% lidocaine without epinephrine after
prepped and draped in the usual sterile fashion. Puncture was
performed at L3-4 using a 5 inch 22 gauge spinal needle via left
paramedian approach. Using a single pass through the dura, the
needle was placed within the thecal sac, with return of clear CSF.
15 mL of Isovue [id number]-[id number] was injected into the thecal sac, with normal
opacification of the nerve roots and cauda equina consistent with
free flow within the subarachnoid space.

I personally performed the lumbar puncture and administered the
intrathecal contrast. I also personally performed acquisition of the
myelogram images.
FINDINGS: LUMBAR MYELOGRAM FINDINGS:

No abnormality seen at L3-4 or above.

Small anterior extradural defect at L4-5. No apparent compressive
stenosis.

Previous discectomy and fusion procedure at L5-S1. No apparent
compressive stenosis. No motion seen with flexion extension.

CT LUMBAR MYELOGRAM FINDINGS:

T11-12: Minimal disc bulge. Mild facet osteoarthritis. No
compressive stenosis. Dorsal neurostimulator passing in the midline.

T12-L1 through L3-4: Normal. Minimal mixed injection at the needle
placement site.

L4-5: Mild bulging of the disc. Mild facet osteoarthritis.
Mild/minimal narrowing of the lateral recesses and foramina but
without visible neural compression.

L5-S1: Previous posterior decompression, diskectomy and fusion.
Apparent solid union without evidence of motion. No central canal
stenosis. Some right foraminal narrowing due to small osteophytes
and probable bulging disc material, but this appears similar to
previous exams.

Mild/minimal sacroiliac osteoarthritis.
IMPRESSION: Previous posterior decompression, diskectomy and fusion at L5-S1.
Fusion appears solid. No hardware malpositioning or loosening. No
canal stenosis.

Some right foraminal narrowing due to encroachment by osteophytes
and what probably represents bulging disc material. This appears
similar to prior exams however.

L4-5 shows a minor disc bulge and mild facet hypertrophy, with mild
narrowing of the lateral recesses and foramina but without apparent
compressive stenosis.

## 2023-09-14 ENCOUNTER — Encounter (HOSPITAL_COMMUNITY): Payer: Self-pay

## 2023-09-14 ENCOUNTER — Ambulatory Visit (HOSPITAL_COMMUNITY)
Admission: EM | Admit: 2023-09-14 | Discharge: 2023-09-14 | Disposition: A | Discharge status: Home / Self Care | Payer: MEDICAID | Attending: Physician Assistant | Admitting: Physician Assistant

## 2023-09-14 DIAGNOSIS — M5441 Lumbago with sciatica, right side: Secondary | ICD-10-CM

## 2023-09-14 MED ORDER — KETOROLAC TROMETHAMINE 30 MG/ML IJ SOLN
30.0000 mg | Freq: Once | INTRAMUSCULAR | Status: AC
  Administered 2023-09-14: 30 mg via INTRAMUSCULAR

## 2023-09-14 MED ORDER — KETOROLAC TROMETHAMINE 30 MG/ML IJ SOLN
INTRAMUSCULAR | Status: AC
  Filled 2023-09-14: qty 1

## 2023-09-14 MED ORDER — CYCLOBENZAPRINE HCL 10 MG PO TABS: 10.0000 mg | ORAL_TABLET | Freq: Two times a day (BID) | ORAL | 0 refills | Status: DC | PRN

## 2023-09-14 MED ORDER — PREDNISONE 10 MG PO TABS: 40.0000 mg | ORAL_TABLET | Freq: Every day | ORAL | 0 refills | Status: AC

## 2023-09-14 NOTE — Discharge Instructions (Signed)
 Take prednisone  as prescribed. Avoid NSAIDs like ibuprofen  or Aleve  while taking  prednisone .  Can take Tylenol  as needed. Recommend ice to affected area and gentle stretching. If no improvement or symptoms become worse please return for evaluation.

## 2023-09-14 NOTE — ED Provider Notes (Signed)
 [facility]    CSN: [id number] Arrival date & time: 09/14/23  1418      History   Chief Complaint Chief Complaint  Patient presents with   Back Pain    HPI LINDELL RENFREW is a 58 y.o. male.   Patient complains of right lower back pain that started yesterday.  He reports he slipped and caught himself with his arm while at worksite.  He did not fall or land on his back.  He reports right sided lower back pain and tenderness with radiation down the right leg.  Does report some numbness and tingling in his right foot but denies weakness, loss of bowel or bladder control.  Reports sitting makes the pain worse.  He has been taking nothing for the symptoms.  Does report similar situation in the past which resolved with medications in time.    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   Painful orthopaedic hardware (HCC) 01/22/2023   Pain in right leg 08/06/2022   Fatigue 07/31/2022   History of DVT (deep vein thrombosis) 07/17/2022   Tremors of nervous system 07/17/2022   Nonischemic cardiomyopathy (HCC) 05/18/2022   HFrEF (heart failure with reduced ejection fraction) (HCC) 05/18/2022   Snoring 04/04/2022   Normocytic anemia 12/22/2021   History of pulmonary embolism 10/19/2021   Status post lumbar spinal fusion 01/03/2018   Lumbar stenosis 12/23/2017   Spondylolisthesis,  lumbar region 06/25/2017   Achilles tendinitis of left lower extremity 12/07/2016   Achilles tendinitis, left leg 05/17/2016   Obstructive sleep apnea on CPAP 03/07/2015   Achilles rupture, left 10/21/2014   Bipolar disorder, unspecified (HCC) 01/07/2013   Headache 01/07/2013   Chronic anticoagulation 01/04/2013    Past Surgical History:  Procedure Laterality Date   ACHILLES TENDON SURGERY Left 10/21/2014   Procedure: Left Achilles Reconstruction;  Surgeon: Jerona Harden GAILS, MD;  Location: [facility] OR;  Service: Orthopedics;  Laterality: Left;   APPENDECTOMY     CARDIAC CATHETERIZATION  03/04/2018   [facility]: Normal coronaries, LVEF estimated at 40%, medical Rx   CRANIOTOMY N/A 01/19/2013   Procedure: CRANIOTOMY HEMATOMA EVACUATION SUBDURAL;  Surgeon: Lamar LELON Peaches, MD;  Location: [facility];  Service: Neurosurgery;  Laterality: N/A;   CYSTECTOMY     right head   ELBOW SURGERY     right   FRACTURE SURGERY Right    finger- 1st digit right hand   HARDWARE REMOVAL Right 01/22/2023   Procedure: REMOVAL OF RIGHT LUMBAR FIVE-SACRAL ONE HARDWARE;  Surgeon: Colon Shove, MD;  Location: [facility] OR;  Service: Neurosurgery;  Laterality: Right;   I & D EXTREMITY Left 12/07/2016   Procedure: LEFT ACHILLES DEBRIDEMENT;  Surgeon: Harden Jerona GAILS, MD;  Location: [facility] OR;  Service: Orthopedics;  Laterality: Left;   LIPOMA EXCISION Left 01/22/2023   Procedure: RESECTION OF  LIPOMA;  Surgeon: Colon Shove, MD;  Location: [facility] OR;  Service: Neurosurgery;  Laterality: Left;   LUMBAR FUSION  12/23/2017   L5 GILL PROCEDURE, RIGHT L5-S1, TRANSFORAMIAL LUMBAR INTERBODY FUSION, BILATERAL LATERAL FUSION, PEDICLE INSTRUMENTATION   MULTIPLE EXTRACTIONS WITH ALVEOLOPLASTY N/A 03/07/2015   Procedure: MULTIPLE EXTRACTION WITH ALVEOLOPLASTY;  Surgeon: Glendia Primrose, DDS;  Location: [facility] OR;  Service: Oral Surgery;  Laterality: N/A;   RIGHT/LEFT HEART CATH AND CORONARY ANGIOGRAPHY N/A 05/18/2022   Procedure: RIGHT/LEFT  HEART CATH AND CORONARY ANGIOGRAPHY;  Surgeon: Rolan Ezra RAMAN, MD;  Location: [facility] INVASIVE CV LAB;  Service: Cardiovascular;  Laterality: N/A;   SPINAL CORD STIMULATOR INSERTION N/A 05/18/2019   Procedure: LUMBAR SPINAL CORD STIMULATOR INSERTION;  Surgeon: Mindi Mt, MD;  Location: [facility] OR;  Service: Neurosurgery;  Laterality: N/A;  Thoracic/Lumbar   SPINAL CORD STIMULATOR INSERTION N/A 04/06/2020   Procedure: Revision of spinal cord stimulator;  Surgeon: Darlis Deatrice RAMAN, MD;  Location: [facility] OR;  Service: Neurosurgery;  Laterality: N/A;   SPINAL CORD STIMULATOR REMOVAL N/A 10/30/2022   Procedure: SCS REVISION OF GENERATOR;  Surgeon: Colon Shove, MD;  Location: [facility] OR;  Service: Neurosurgery;  Laterality: N/A;  3C   SUBQ ICD IMPLANT N/A 03/01/2023   Procedure: SUBQ ICD IMPLANT;  Surgeon: Cindie Ole DASEN, MD;  Location: [facility] INVASIVE CV LAB;  Service: Cardiovascular;  Laterality: N/A;   TEE WITHOUT CARDIOVERSION N/A 05/18/2022   Procedure: TRANSESOPHAGEAL ECHOCARDIOGRAM (TEE);  Surgeon: Rolan Ezra RAMAN, MD;  Location: [facility] ENDOSCOPY;  Service: Cardiovascular;  Laterality: N/A;       Home Medications    Prior to Admission medications   Medication Sig Start Date End Date Taking? Authorizing Provider  baclofen  (LIORESAL ) 10 MG tablet Take 1 tablet (10 mg total) by mouth 3 (three) times daily. 09/08/23  Yes Johnie, Carley A, NP  carvedilol  (COREG ) 12.5 MG tablet Take 1.5 tablets (18.75 mg total) by mouth 2 (two) times daily. 04/02/23 04/01/24 Yes Rolan Ezra RAMAN, MD  cyclobenzaprine  (FLEXERIL ) 10 MG tablet Take 1 tablet (10 mg total) by mouth 2 (two) times daily as needed for muscle spasms. 09/14/23  Yes Ward, Harlene PEDLAR, PA-C  dabigatran  (PRADAXA ) 150 MG CAPS capsule Take 1 capsule (150 mg total) by mouth 2 (two) times daily. 04/02/23  Yes Rolan Ezra RAMAN, MD  doxepin  (SINEQUAN ) 75 MG capsule Take 75 mg by mouth at bedtime. 07/24/22  Yes [provider]  empagliflozin  (JARDIANCE ) 10 MG  TABS tablet Take 1 tablet (10 mg total) by mouth daily before breakfast. 04/02/23  Yes Rolan Ezra RAMAN, MD  hydrOXYzine  (ATARAX ) 25 MG tablet Take 25 mg by mouth 3 (three) times daily as needed for anxiety. 11/21/22  Yes [provider]  mirtazapine  (REMERON ) 45 MG tablet Take 45 mg by mouth at bedtime.   Yes [provider]  pravastatin  (PRAVACHOL ) 10 MG tablet Take 1 tablet (10 mg total) by mouth at bedtime. 04/02/23  Yes Rolan Ezra RAMAN, MD  predniSONE  (DELTASONE ) 10 MG tablet Take 4 tablets (40 mg total) by mouth daily for 5 days. 09/14/23 09/19/23 Yes Ward, Harlene PEDLAR, PA-C  QUEtiapine  (SEROQUEL  XR) 400 MG 24 hr tablet Take 800 mg by mouth at bedtime. 11/16/21  Yes [provider]  sacubitril -valsartan  (ENTRESTO ) 97-103 MG Take 1 tablet by mouth 2 (two) times daily. 04/02/23  Yes Rolan Ezra RAMAN, MD  furosemide  (LASIX ) 40 MG tablet Take 1 tablet (40 mg total) by mouth as needed for fluid or edema (take if a weight  gain of 3 lbs within 24 hours or 5 lbs in a week.). Patient not taking: Reported on 09/08/2023 04/02/23 07/01/23  Rolan Ezra RAMAN, MD    Family History Family History  Problem Relation Age of Onset   Hypertension Mother    Stroke Mother    Multiple sclerosis Sister    Down syndrome Son     Social History Social History   Tobacco Use   Smoking status: Never    Passive exposure: Past   Smokeless tobacco: Never  Vaping Use   Vaping status: Never Used  Substance Use Topics   Alcohol use: No   Drug use: No     Allergies   Lyrica [pregabalin]   Review of Systems Review of Systems  Constitutional:  Negative for chills and fever.  HENT:  Negative for ear pain and sore throat.   Eyes:  Negative for pain and visual disturbance.  Respiratory:  Negative for cough and shortness of breath.   Cardiovascular:  Negative for chest pain and palpitations.  Gastrointestinal:  Negative for abdominal pain and vomiting.  Genitourinary:  Negative for dysuria and  hematuria.  Musculoskeletal:  Positive for back pain. Negative for arthralgias.  Skin:  Negative for color change and rash.  Neurological:  Negative for seizures and syncope.  All other systems reviewed and are negative.    Physical Exam Triage Vital Signs ED Triage Vitals  Encounter Vitals Group     BP 09/14/23 1449 (!) 97/59     Girls Systolic BP Percentile --      Girls Diastolic BP Percentile --      Boys Systolic BP Percentile --      Boys Diastolic BP Percentile --      Pulse Rate 09/14/23 1449 100     Resp 09/14/23 1449 19     Temp 09/14/23 1449 97.7 F (36.5 C)     Temp Source 09/14/23 1449 Oral     SpO2 09/14/23 1449 94 %     Weight 09/14/23 1447 230 lb (104.3 kg)     Height 09/14/23 1447 5' 9 (1.753 m)     Head Circumference --      Peak Flow --      Pain Score 09/14/23 1447 10     Pain Loc --      Pain Education --      Exclude from Growth Chart --    No data found.  Updated Vital Signs BP (!) 97/59 (BP Location: Left Arm)   Pulse 100   Temp 97.7 F (36.5 C) (Oral)   Resp 19   Ht 5' 9 (1.753 m)   Wt 230 lb (104.3 kg)   SpO2 94%   BMI 33.97 kg/m   Visual Acuity Right Eye Distance:   Left Eye Distance:   Bilateral Distance:    Right Eye Near:   Left Eye Near:    Bilateral Near:     Physical Exam Vitals and nursing note reviewed.  Constitutional:      General: He is not in acute distress.    Appearance: He is well-developed.  HENT:     Head: Normocephalic and atraumatic.   Eyes:     Conjunctiva/sclera: Conjunctivae normal.    Cardiovascular:     Rate and Rhythm: Normal rate and regular rhythm.     Heart sounds: No murmur heard. Pulmonary:     Effort: Pulmonary effort is normal. No respiratory distress.     Breath sounds: Normal breath sounds.  Abdominal:  Palpations: Abdomen is soft.     Tenderness: There is no abdominal tenderness.   Musculoskeletal:        General: No swelling.     Cervical back: Neck supple.     Comments:  Right lumbar paraspinal musculature tenderness with palpation and spasm on exam.  Positive right straight leg raise.   Skin:    General: Skin is warm and dry.     Capillary Refill: Capillary refill takes less than 2 seconds.   Neurological:     Mental Status: He is alert.   Psychiatric:        Mood and Affect: Mood normal.      UC Treatments / Results  Labs (all labs ordered are listed, but only abnormal results are displayed) Labs Reviewed - No data to display  EKG   Radiology No results found.  Procedures Procedures (including critical care time)  Medications Ordered in UC Medications  ketorolac  (TORADOL ) 30 MG/ML injection 30 mg (has no administration in time range)    Initial Impression / Assessment and Plan / UC Course  I have reviewed the triage vital signs and the nursing notes.  Pertinent labs & imaging results that were available during my care of the patient were reviewed by me and considered in my medical decision making (see chart for details).     Right-sided back pain with spasm.  Toradol  given in clinic today.  Prescribed prednisone  course and muscle relaxer Flexeril .  Supportive care discussed.  Return precautions discussed.  Patient with no red flag symptoms here in clinic today. Final Clinical Impressions(s) / UC Diagnoses   Final diagnoses:  Acute right-sided low back pain with right-sided sciatica     Discharge Instructions      Take prednisone  as prescribed. Avoid NSAIDs like ibuprofen  or Aleve  while taking  prednisone .  Can take Tylenol  as needed. Recommend ice to affected area and gentle stretching. If no improvement or symptoms become worse please return for evaluation.   ED Prescriptions     Medication Sig Dispense Auth. Provider   cyclobenzaprine  (FLEXERIL ) 10 MG tablet Take 1 tablet (10 mg total) by mouth 2 (two) times daily as needed for muscle spasms. 20 tablet Ward, Luisa Louk Z, PA-C   predniSONE  (DELTASONE ) 10 MG tablet Take 4  tablets (40 mg total) by mouth daily for 5 days. 20 tablet Ward, Balen Woolum Z, PA-C      PDMP not reviewed this encounter.   Ward, Harlene PEDLAR, PA-C 09/14/23 [phone]

## 2023-09-14 NOTE — ED Triage Notes (Signed)
 Pt states that he fell and injured is back x1 day

## 2023-09-24 ENCOUNTER — Encounter: Payer: Self-pay | Admitting: Physician Assistant

## 2023-09-24 ENCOUNTER — Ambulatory Visit: Payer: MEDICAID | Admitting: Physician Assistant

## 2023-09-24 VITALS — BP 103/62 | HR 68 | Ht 73.0 in | Wt 230.0 lb

## 2023-09-24 DIAGNOSIS — M5441 Lumbago with sciatica, right side: Secondary | ICD-10-CM

## 2023-09-24 MED ORDER — GABAPENTIN 100 MG PO CAPS: 100.0000 mg | ORAL_CAPSULE | Freq: Two times a day (BID) | ORAL | 0 refills | Status: DC

## 2023-09-24 MED ORDER — KETOROLAC TROMETHAMINE 60 MG/2ML IM SOLN
60.0000 mg | Freq: Once | INTRAMUSCULAR | Status: AC
  Administered 2023-09-25: 60 mg via INTRAMUSCULAR

## 2023-09-24 NOTE — Patient Instructions (Addendum)
 VISIT SUMMARY:  You came in today because of worsening back pain that radiates to your legs. The pain is mainly on the lower right side of your back and goes down your right leg, causing numbness in your toes and shooting pain up the back of your leg. Recently, the pain has also started to affect your left side. You mentioned that the pain gets worse when driving, getting up, or lying down. You initially sought care at urgent care and received an injection and a steroid taper, which did not provide significant relief. Muscle relaxers were also prescribed but were ineffective. You recall slipping at your complex, which may have triggered the pain.  YOUR PLAN:  -LOW BACK PAIN WITH RADICULOPATHY: You have chronic low back pain that radiates to your legs, likely due to nerve involvement. This condition can cause pain, numbness, and tingling in your legs. To help manage your pain, we administered a Ketorolac  injection today. We also prescribed gabapentin , which you should take twice daily to help with nerve pain. We have ordered a lumbar spine x-ray to get a better look at your back, and we will notify you of the results. Additionally, we are referring you to an orthopedic specialist for further evaluation. They will contact you to schedule an appointment.  INSTRUCTIONS:  Please take gabapentin  twice daily as prescribed. We will notify you of your x-ray results. An orthopedic specialist will contact you to schedule an appointment for further evaluation.  Sciatica  Sciatica is pain, numbness, weakness, or tingling along the path of the sciatic nerve. The sciatic nerve starts in the lower back and runs down the back of each leg. The nerve controls the muscles in the lower leg and in the back of the knee. It also provides feeling (sensation) to the back of the thigh, the lower leg, and the sole of the foot. Sciatica is a symptom of another medical condition that pinches or puts pressure on the sciatic  nerve. Sciatica most often only affects one side of the body. Sciatica usually goes away on its own or with treatment. In some cases, sciatica may come back (recur). What are the causes? This condition is caused by pressure on the sciatic nerve or pinching of the nerve. This may be the result of: A disk in between the bones of the spine bulging out too far (herniated disk). Age-related changes in the spinal disks. A pain disorder that affects a muscle in the buttock. Extra bone growth near the sciatic nerve. A break (fracture) of the pelvis. Pregnancy. Tumor. This is rare. What increases the risk? The following factors may make you more likely to develop this condition: Playing sports that place pressure or stress on the spine. Having poor strength and flexibility. A history of back injury or surgery. Sitting for long periods of time. Doing activities that involve repetitive bending or lifting. Obesity. What are the signs or symptoms? Symptoms can vary from mild to very severe. They may include: Any of the following problems in the lower back, leg, hip, or buttock: Mild tingling, numbness, or dull aches. Burning sensations. Sharp pains. Numbness in the back of the calf or the sole of the foot. Leg weakness. Severe back pain that makes movement difficult. Symptoms may get worse when you cough, sneeze, or laugh, or when you sit or stand for long periods of time. How is this diagnosed? This condition may be diagnosed based on: Your symptoms and medical history. A physical exam. Blood tests. Imaging tests, such  as: X-rays. An MRI. A CT scan. How is this treated? In many cases, this condition improves on its own without treatment. However, treatment may include: Reducing or modifying physical activity. Exercising, including strengthening and stretching. Icing and applying heat to the affected area. Medicines that help to: Relieve pain and swelling. Relax your  muscles. Injections of medicines that help to relieve pain and inflammation (steroids) around the sciatic nerve. Surgery. Follow these instructions at home: Medicines Take over-the-counter and prescription medicines only as told by your health care provider. Ask your health care provider if the medicine prescribed to you requires you to avoid driving or using heavy machinery. Managing pain     If directed, put ice on the affected area. To do this: Put ice in a plastic bag. Place a towel between your skin and the bag. Leave the ice on for 20 minutes, 2-3 times a day. If your skin turns bright red, remove the ice right away to prevent skin damage. The risk of skin damage is higher if you cannot feel pain, heat, or cold. If directed, apply heat to the affected area as often as told by your health care provider. Use the heat source that your health care provider recommends, such as a moist heat pack or a heating pad. Place a towel between your skin and the heat source. Leave the heat on for 20-30 minutes. If your skin turns bright red, remove the heat right away to prevent burns. The risk of burns is higher if you cannot feel pain, heat, or cold. Activity  Return to your normal activities as told by your health care provider. Ask your health care provider what activities are safe for you. Avoid activities that make your symptoms worse. Take brief periods of rest throughout the day. When you rest for longer periods, mix in some mild activity or stretching between periods of rest. This will help to prevent stiffness and pain. Avoid sitting for long periods of time without moving. Get up and move around at least one time each hour. Exercise and stretch regularly as told by your health care provider. Do not lift anything that is heavier than 10 lb (4.5 kg) until your health care provider says that it is safe. When you do not have symptoms, you should still avoid heavy lifting, especially  repetitive heavy lifting. When you lift objects, always use proper lifting technique, which includes: Bending your knees. Keeping the load close to your body. Avoiding twisting. General instructions Maintain a healthy weight. Excess weight puts extra stress on your back. Wear supportive, comfortable shoes. Avoid wearing high heels. Avoid sleeping on a mattress that is too soft or too hard. A mattress that is firm enough to support your back when you sleep may help to reduce your pain. Contact a health care provider if: Your pain is not controlled by medicine. Your pain does not improve or gets worse. Your pain lasts longer than 4 weeks. You have unexplained weight loss. Get help right away if: You are not able to control when you urinate or have bowel movements (incontinence). You have: Weakness in your lower back, pelvis, buttocks, or legs that gets worse. Redness or swelling of your back. A burning sensation when you urinate. Summary Sciatica is pain, numbness, weakness, or tingling along the path of the sciatic nerve, which may include the lower back, legs, hips, and buttocks. This condition is caused by pressure on the sciatic nerve or pinching of the nerve. Treatment often includes rest, exercise,  medicines, and applying ice or heat. This information is not intended to replace advice given to you by your health care provider. Make sure you discuss any questions you have with your health care provider. Document Revised: 06/12/2021 Document Reviewed: 06/12/2021 Elsevier Patient Education  2024 [employer].

## 2023-09-24 NOTE — Progress Notes (Unsigned)
 New Patient Office Visit  Subjective    Patient ID: Justin [name], male    DOB: Aug 13, 1965  Age: 58 y.o. MRN: [id number]  CC:  Chief Complaint  Patient presents with   Back Pain    Pain radiates down right leg causing numbness to leg and toes    Discussed the use of AI scribe software for clinical note transcription with the patient, who gave verbal consent to proceed.  History of Present Illness   Justin [name] is a 58 year old male who presents with worsening back pain radiating to the legs.  The pain is located on the lower right side of his back and radiates down his right leg, causing numbness in his toes and shooting pain up the back of his leg. Recently, the pain has also started to cross over to the left side. The pain intensifies when driving, getting up, or lying down. He initially sought care at urgent care on June 22nd and returned on June 28th, where he received an injection for pain and a steroid taper, which did not provide significant relief. Muscle relaxers were also prescribed but were ineffective. He recalls slipping at his complex, which he believes may have triggered the pain, as he felt something 'popping' in his back at that time.    Outpatient Encounter Medications as of 09/24/2023  Medication Sig   carvedilol  (COREG ) 12.5 MG tablet Take 1.5 tablets (18.75 mg total) by mouth 2 (two) times daily.   dabigatran  (PRADAXA ) 150 MG CAPS capsule Take 1 capsule (150 mg total) by mouth 2 (two) times daily.   doxepin  (SINEQUAN ) 75 MG capsule Take 75 mg by mouth at bedtime.   empagliflozin  (JARDIANCE ) 10 MG TABS tablet Take 1 tablet (10 mg total) by mouth daily before breakfast.   gabapentin  (NEURONTIN ) 100 MG capsule Take 1 capsule (100 mg total) by mouth 2 (two) times daily.   hydrOXYzine  (ATARAX ) 25 MG tablet Take 25 mg by mouth 3 (three) times daily as needed for anxiety.   mirtazapine  (REMERON ) 45 MG tablet Take 45 mg by mouth at bedtime.   pravastatin  (PRAVACHOL )  10 MG tablet Take 1 tablet (10 mg total) by mouth at bedtime.   QUEtiapine  (SEROQUEL  XR) 400 MG 24 hr tablet Take 800 mg by mouth at bedtime.   sacubitril -valsartan  (ENTRESTO ) 97-103 MG Take 1 tablet by mouth 2 (two) times daily.   baclofen  (LIORESAL ) 10 MG tablet Take 1 tablet (10 mg total) by mouth 3 (three) times daily.   cyclobenzaprine  (FLEXERIL ) 10 MG tablet Take 1 tablet (10 mg total) by mouth 2 (two) times daily as needed for muscle spasms.   furosemide  (LASIX ) 40 MG tablet Take 1 tablet (40 mg total) by mouth as needed for fluid or edema (take if a weight gain of 3 lbs within 24 hours or 5 lbs in a week.). (Patient not taking: Reported on 09/24/2023)   [EXPIRED] ketorolac  (TORADOL ) injection 60 mg    No facility-administered encounter medications on file as of 09/24/2023.    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    Past Surgical History:  Procedure Laterality Date   ACHILLES TENDON SURGERY Left 10/21/2014   Procedure: Left Achilles Reconstruction;  Surgeon: Jerona Harden GAILS, MD;  Location: [facility] OR;  Service: Orthopedics;  Laterality: Left;   APPENDECTOMY     CARDIAC CATHETERIZATION  03/04/2018   [facility]: Normal coronaries, LVEF estimated at 40%, medical Rx   CRANIOTOMY N/A 01/19/2013   Procedure: CRANIOTOMY HEMATOMA EVACUATION SUBDURAL;  Surgeon: Lamar LELON Peaches, MD;  Location: [facility] NEURO ORS;  Service: Neurosurgery;  Laterality: N/A;   CYSTECTOMY     right head   ELBOW SURGERY     right    FRACTURE SURGERY Right    finger- 1st digit right hand   HARDWARE REMOVAL Right 01/22/2023   Procedure: REMOVAL OF RIGHT LUMBAR FIVE-SACRAL ONE HARDWARE;  Surgeon: Colon Shove, MD;  Location: [facility] OR;  Service: Neurosurgery;  Laterality: Right;   I & D EXTREMITY Left 12/07/2016   Procedure: LEFT ACHILLES DEBRIDEMENT;  Surgeon: Harden Jerona GAILS, MD;  Location: [facility] OR;  Service: Orthopedics;  Laterality: Left;   LIPOMA EXCISION Left 01/22/2023   Procedure: RESECTION OF LIPOMA;  Surgeon: Colon Shove, MD;  Location: [facility] OR;  Service: Neurosurgery;  Laterality: Left;   LUMBAR FUSION  12/23/2017   L5 GILL PROCEDURE, RIGHT L5-S1, TRANSFORAMIAL LUMBAR INTERBODY FUSION, BILATERAL LATERAL FUSION, PEDICLE INSTRUMENTATION   MULTIPLE EXTRACTIONS WITH ALVEOLOPLASTY N/A 03/07/2015   Procedure: MULTIPLE EXTRACTION WITH ALVEOLOPLASTY;  Surgeon: Glendia Primrose, DDS;  Location: [facility] OR;  Service: Oral Surgery;  Laterality: N/A;   RIGHT/LEFT HEART CATH AND CORONARY ANGIOGRAPHY N/A 05/18/2022   Procedure: RIGHT/LEFT HEART CATH AND CORONARY ANGIOGRAPHY;  Surgeon: Rolan Ezra RAMAN, MD;  Location: [facility] INVASIVE CV LAB;  Service: Cardiovascular;  Laterality: N/A;   SPINAL CORD STIMULATOR INSERTION N/A 05/18/2019   Procedure: LUMBAR SPINAL CORD STIMULATOR INSERTION;  Surgeon: Mindi Mt, MD;  Location: [facility] OR;  Service: Neurosurgery;  Laterality: N/A;  Thoracic/Lumbar   SPINAL CORD STIMULATOR INSERTION N/A 04/06/2020   Procedure: Revision of spinal cord stimulator;  Surgeon: Darlis Deatrice RAMAN, MD;  Location: [facility] OR;  Service: Neurosurgery;  Laterality: N/A;   SPINAL CORD STIMULATOR REMOVAL N/A 10/30/2022   Procedure: SCS REVISION OF GENERATOR;  Surgeon: Colon Shove, MD;  Location: [facility] OR;  Service: Neurosurgery;  Laterality: N/A;  3C   SUBQ ICD IMPLANT N/A 03/01/2023   Procedure: SUBQ ICD IMPLANT;  Surgeon: Cindie Ole DASEN, MD;  Location: [facility] INVASIVE CV LAB;  Service: Cardiovascular;  Laterality: N/A;   TEE WITHOUT  CARDIOVERSION N/A 05/18/2022   Procedure: TRANSESOPHAGEAL ECHOCARDIOGRAM (TEE);  Surgeon: Rolan Ezra RAMAN, MD;  Location: [facility] ENDOSCOPY;  Service: Cardiovascular;  Laterality: N/A;    Family History  Problem Relation Age of Onset   Hypertension Mother    Stroke Mother    Multiple sclerosis Sister    Down syndrome Son     Social History   Socioeconomic History   Marital status: Significant Other    Spouse name: Not on file   Number of children: 4   Years of education: Not on file   Highest education level: Not on file  Occupational History   Occupation: disability  Tobacco Use   Smoking status: Never    Passive exposure: Past   Smokeless tobacco: Never  Vaping Use   Vaping status: Never Used  Substance and Sexual Activity  Alcohol use: No   Drug use: No   Sexual activity: Not on file  Other Topics Concern   Not on file  Social History Narrative   [place]    Lives in a 2 story apartment.  His cousin is currently staying with him.  Has 4 children.  On disability for the past 10 years for pulmonary embolism, bipolar disease.  Education: high school.   Social Drivers of Corporate investment banker Strain: Not on file  Food Insecurity: Patient Declined (03/01/2023)   Hunger Vital Sign    Worried About Running Out of Food in the Last Year: Patient declined    Ran Out of Food in the Last Year: Patient declined  Transportation Needs: No Transportation Needs (03/01/2023)   PRAPARE - Administrator, Civil Service (Medical): No    Lack of Transportation (Non-Medical): No  Physical Activity: Not on file  Stress: Not on file  Social Connections: Unknown (07/31/2021)   Received from [facility]   Social Network    Social Network: Not on file  Intimate Partner Violence: Not At Risk (03/01/2023)   Humiliation, Afraid, Rape, and Kick questionnaire    Fear of Current or Ex-Partner: No    Emotionally Abused: No    Physically Abused: No    Sexually  Abused: No    Review of Systems  Constitutional: Negative.   HENT: Negative.    Eyes: Negative.   Respiratory:  Negative for shortness of breath.   Cardiovascular:  Negative for chest pain.  Gastrointestinal: Negative.   Genitourinary: Negative.   Musculoskeletal:  Positive for back pain.  Skin: Negative.   Neurological: Negative.   Endo/Heme/Allergies: Negative.   Psychiatric/Behavioral: Negative.          Objective    BP 103/62 (BP Location: Left Arm, Patient Position: Sitting, Cuff Size: Large)   Pulse 68   Ht 6' 1 (1.854 m)   Wt 230 lb (104.3 kg)   SpO2 96%   BMI 30.34 kg/m   Physical Exam Vitals and nursing note reviewed.  Constitutional:      Appearance: Normal appearance.  HENT:     Head: Normocephalic and atraumatic.     Right Ear: External ear normal.     Left Ear: External ear normal.     Nose: Nose normal.     Mouth/Throat:     Mouth: Mucous membranes are moist.     Pharynx: Oropharynx is clear.  Eyes:     Conjunctiva/sclera: Conjunctivae normal.     Pupils: Pupils are equal, round, and reactive to light.  Cardiovascular:     Rate and Rhythm: Normal rate and regular rhythm.     Pulses: Normal pulses.     Heart sounds: Normal heart sounds.  Pulmonary:     Effort: Pulmonary effort is normal.     Breath sounds: Normal breath sounds.  Musculoskeletal:     Cervical back: Normal, normal range of motion and neck supple.     Thoracic back: Normal.     Lumbar back: Decreased range of motion.     Comments: Pain elicited with ROM testing   Skin:    General: Skin is warm and dry.  Neurological:     General: No focal deficit present.     Mental Status: He is alert and oriented to person, place, and time.  Psychiatric:        Mood and Affect: Mood normal.        Behavior: Behavior  normal.        Thought Content: Thought content normal.        Judgment: Judgment normal.         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 -  Administer Ketorolac  injection for pain relief. - Prescribe gabapentin  twice daily for nerve pain. - Order lumbar spine x-ray. - Refer to orthopedics for further evaluation.    I have reviewed the patient's medical history (PMH, PSH, Social History, Family History, Medications, and allergies) , and have been updated if relevant. I spent 30 minutes reviewing chart and  face to face time with patient.   Return if symptoms worsen or fail to improve.   Kirk RAMAN Mayers, PA-C

## 2023-09-25 ENCOUNTER — Encounter: Payer: Self-pay | Admitting: Physician Assistant

## 2023-09-25 DIAGNOSIS — M5441 Lumbago with sciatica, right side: Secondary | ICD-10-CM | POA: Diagnosis not present

## 2023-10-14 ENCOUNTER — Other Ambulatory Visit: Payer: Self-pay

## 2023-10-14 ENCOUNTER — Emergency Department (HOSPITAL_BASED_OUTPATIENT_CLINIC_OR_DEPARTMENT_OTHER): Payer: MEDICAID

## 2023-10-14 ENCOUNTER — Encounter (HOSPITAL_BASED_OUTPATIENT_CLINIC_OR_DEPARTMENT_OTHER): Payer: Self-pay | Admitting: Emergency Medicine

## 2023-10-14 ENCOUNTER — Emergency Department (HOSPITAL_BASED_OUTPATIENT_CLINIC_OR_DEPARTMENT_OTHER)
Admission: EM | Admit: 2023-10-14 | Discharge: 2023-10-14 | Disposition: A | Discharge status: Home / Self Care | Payer: MEDICAID | Attending: Emergency Medicine | Admitting: Emergency Medicine

## 2023-10-14 DIAGNOSIS — M5431 Sciatica, right side: Secondary | ICD-10-CM | POA: Insufficient documentation

## 2023-10-14 MED ORDER — PREDNISONE 10 MG (21) PO TBPK
ORAL_TABLET | Freq: Every day | ORAL | 0 refills | Status: DC
Start: 2023-10-14 — End: 2023-11-14

## 2023-10-14 MED ORDER — HYDROCODONE-ACETAMINOPHEN 5-325 MG PO TABS
1.0000 | ORAL_TABLET | Freq: Once | ORAL | Status: AC
  Administered 2023-10-14: 1 via ORAL
  Filled 2023-10-14: qty 1

## 2023-10-14 MED ORDER — OXYCODONE-ACETAMINOPHEN 5-325 MG PO TABS: 1.0000 | ORAL_TABLET | Freq: Four times a day (QID) | ORAL | 0 refills | Status: DC | PRN

## 2023-10-14 MED ORDER — LIDOCAINE 5 % EX PTCH
1.0000 | MEDICATED_PATCH | CUTANEOUS | 0 refills | Status: DC
Start: 2023-10-14 — End: 2023-11-30

## 2023-10-14 NOTE — ED Provider Notes (Signed)
 [place] EMERGENCY DEPARTMENT AT [facility] Provider Note   CSN: [id number] Arrival date & time: 10/14/23  1442    Patient presents with: Foot Pain   Justin [name] is a 58 y.o. male here for evaluation of possible blood clot.  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  States he has small discolored area on his bilateral feet.  Noted yesterday.  He denies any recent injury or trauma. He states he has had some right leg pain which goes from his calf into his gluteal region.  He did have a fall previously followed by orthopedics- Seen 6/22,6/28,7/8.  Has an appointment in 3 days (7/31) for recheck.  No chest pain, shortness of breath, syncope, numbness, weakness.  No lower extremity swelling, erythema or warmth.   HPI     Prior to Admission medications   Medication Sig Start Date End Date Taking? Authorizing Provider  lidocaine  (LIDODERM ) 5 % Place 1 patch onto the skin daily. Remove & Discard patch within 12 hours or as directed by MD 10/14/23  Yes Claira Jeter A, PA-C  oxyCODONE -acetaminophen  (PERCOCET/ROXICET) 5-325 MG tablet Take 1 tablet by mouth every 6 (six) hours as needed for severe pain (pain score 7-10). 10/14/23  Yes Naseem Adler A, PA-C  predniSONE  (STERAPRED UNI-PAK 21 TAB) 10 MG (21) TBPK tablet Take by mouth daily. Take 6 tabs by mouth daily  for 1 days, then 5 tabs for 1 days, then 4 tabs for 1 days, then 3 tabs for 1 days, 2 tabs for 1 days, then 1 tab by mouth daily for 1 days 10/14/23  Yes Ariyanna Oien A, PA-C  baclofen  (LIORESAL ) 10 MG tablet Take 1 tablet (10 mg total) by mouth 3 (three) times daily. 09/08/23   Johnie Flaming A, NP  carvedilol  (COREG ) 12.5 MG tablet Take 1.5 tablets (18.75 mg total) by mouth 2 (two) times daily. 04/02/23 04/01/24  Rolan Ezra RAMAN, MD  cyclobenzaprine  (FLEXERIL ) 10 MG tablet Take 1 tablet (10 mg total) by mouth 2 (two) times daily as needed for muscle spasms. 09/14/23   Ward, Harlene PEDLAR, PA-C  dabigatran  (PRADAXA )  150 MG CAPS capsule Take 1 capsule (150 mg total) by mouth 2 (two) times daily. 04/02/23   Rolan Ezra RAMAN, MD  doxepin  (SINEQUAN ) 75 MG capsule Take 75 mg by mouth at bedtime. 07/24/22   [provider]  empagliflozin  (JARDIANCE ) 10 MG TABS tablet Take 1 tablet (10 mg total) by mouth daily before breakfast. 04/02/23   Rolan Ezra RAMAN, MD  furosemide  (LASIX ) 40 MG tablet Take 1 tablet (40 mg total) by mouth as needed for fluid or edema (take if a weight gain of 3 lbs within 24 hours or 5 lbs in a week.). Patient not taking: Reported on 09/24/2023 04/02/23 07/01/23  Rolan Ezra RAMAN, MD  gabapentin  (NEURONTIN ) 100 MG capsule Take 1 capsule (100 mg total) by mouth 2 (two) times daily. 09/24/23   Mayers, Cari S, PA-C  hydrOXYzine  (ATARAX ) 25 MG tablet Take 25 mg by mouth 3 (three) times daily as needed for anxiety. 11/21/22   [provider]  mirtazapine  (REMERON ) 45 MG tablet Take 45 mg by mouth at bedtime.    [provider]  pravastatin  (PRAVACHOL ) 10 MG tablet Take 1 tablet (10 mg total) by mouth at bedtime. 04/02/23   Rolan Ezra RAMAN, MD  QUEtiapine  (SEROQUEL  XR) 400 MG 24 hr tablet Take 800 mg by mouth at bedtime. 11/16/21   [provider]  sacubitril -valsartan  (ENTRESTO )  97-103 MG Take 1 tablet by mouth 2 (two) times daily. 04/02/23   Rolan Ezra RAMAN, MD    Allergies: Lyrica [pregabalin]    Review of Systems  Constitutional: Negative.   HENT: Negative.    Respiratory: Negative.    Cardiovascular: Negative.   Gastrointestinal: Negative.   Genitourinary: Negative.   Musculoskeletal:        RLE pain  Skin:        Skin discoloration BIL feet  Neurological: Negative.   All other systems reviewed and are negative.   Updated Vital Signs BP 107/79 (BP Location: Right Arm)   Pulse 88   Temp 97.9 F (36.6 C) (Oral)   Resp 18   SpO2 98%   Physical Exam Vitals and nursing note reviewed.  Constitutional:      General: He is not in acute distress.     Appearance: He is well-developed. He is not ill-appearing or diaphoretic.  HENT:     Head: Atraumatic.  Eyes:     Pupils: Pupils are equal, round, and reactive to light.  Cardiovascular:     Rate and Rhythm: Normal rate and regular rhythm.     Pulses: Normal pulses.     Heart sounds: Normal heart sounds.  Pulmonary:     Effort: Pulmonary effort is normal. No respiratory distress.     Breath sounds: Normal breath sounds.  Abdominal:     General: Bowel sounds are normal. There is no distension.     Palpations: Abdomen is soft.     Tenderness: There is no abdominal tenderness. There is no guarding or rebound.  Musculoskeletal:        General: Normal range of motion.     Cervical back: Normal range of motion and neck supple.     Comments: No midline C/T/L tenderness.  Tenderness right piriformis. Neg SLR.  Compartman soft, full range of motion no bony tenderness lower extremity  Skin:    General: Skin is warm and dry.     Comments: 3 cm area on bilateral feet with darkened skin pigmentation.  There is no fluctuance, induration, erythema, warmth, rashes or lesions.  Neurological:     General: No focal deficit present.     Mental Status: He is alert and oriented to person, place, and time.     (all labs ordered are listed, but only abnormal results are displayed) Labs Reviewed - No data to display  EKG: None  Radiology: US  Venous Img Lower Bilateral (DVT) Result Date: 10/14/2023 CLINICAL DATA:  144615 Pain 144615 EXAM: BILATERAL LOWER EXTREMITY VENOUS DOPPLER ULTRASOUND TECHNIQUE: Gray-scale sonography with graded compression, as well as color Doppler and duplex ultrasound were performed to evaluate the lower extremity deep venous systems from the level of the common femoral vein and including the common femoral, femoral, profunda femoral, popliteal and calf veins including the posterior tibial, peroneal and gastrocnemius veins when visible. The superficial great saphenous vein was  also interrogated. Spectral Doppler was utilized to evaluate flow at rest and with distal augmentation maneuvers in the common femoral, femoral and popliteal veins. COMPARISON:  September 03, 2023 FINDINGS: RIGHT LOWER EXTREMITY Common Femoral Vein: No evidence of thrombus. Normal compressibility, respiratory phasicity and response to augmentation. Saphenofemoral Junction: No evidence of thrombus. Normal compressibility and flow on color Doppler imaging. Profunda Femoral Vein: No evidence of thrombus. Normal compressibility and flow on color Doppler imaging. Femoral Vein: No evidence of thrombus. Normal compressibility, respiratory phasicity and response to augmentation. Popliteal Vein: No evidence of thrombus.  Normal compressibility, respiratory phasicity and response to augmentation. Calf Veins: No evidence of thrombus. Normal compressibility and flow on color Doppler imaging. Superficial Great Saphenous Vein: No evidence of thrombus. Normal compressibility. Other Findings:  None. LEFT LOWER EXTREMITY Common Femoral Vein: No evidence of thrombus. Normal compressibility, respiratory phasicity and response to augmentation. Saphenofemoral Junction: No evidence of thrombus. Normal compressibility and flow on color Doppler imaging. Profunda Femoral Vein: No evidence of thrombus. Normal compressibility and flow on color Doppler imaging. Femoral Vein: No evidence of thrombus. Normal compressibility, respiratory phasicity and response to augmentation. Popliteal Vein: No evidence of thrombus. Normal compressibility, respiratory phasicity and response to augmentation. Calf Veins: No evidence of thrombus. Normal compressibility and flow on color Doppler imaging. Superficial Great Saphenous Vein: No evidence of thrombus. Normal compressibility. Other Findings:  None. IMPRESSION: Negative for deep venous thrombosis within both legs. Electronically Signed   By: Rogelia Myers M.D.   On: 10/14/2023 19:18     Procedures    Medications Ordered in the ED  HYDROcodone -acetaminophen  (NORCO/VICODIN) 5-325 MG per tablet 1 tablet (1 tablet Oral Given 10/14/23 2269)   58 year old here for evaluation of concern for blood clots.  History of similar.  He is chronically anticoagulated.  He has not missed any doses.  Yesterday he noted some darkened skin on the bilateral superior aspect of his plantar aspects of his feet.  These were nontender.  He does some chronic pain over the last month to his right lower extremity after an injury.  He is seen by urgent care, PCP and orthopedics for similar.  He is following up with Ortho in 3 days.  Symptoms to his right lower extremity do sound like sciatica.  He has no chest pain or shortness of breath.  He is neurovascularly intact. Will plan on ultrasound to rule out DVT however as previously mentioned I suspect likely his right lower extremity pain is from his sciatica.  He has no infectious process on exam.  Equal pulses.  Low suspicion for ischemia.  No red flags for back pain, no symptoms to suggest cauda equina, discitis, osteomyelitis, transverse mass, psoas abscess.  Given his lack of bony tenderness I have low suspicion for occult fracture or dislocation.  2+ DP pulses bilaterally low suspicion for arterial occlusion.  He has no open sores to suggest bony infection such as osteomyelitis.  No crepitus to suggest gas-forming organism.  Dark discoloration of the skin I suspect is likely from rubbing from his shoe wear.   Imaging personally viewed and interpreted:  US  shows no DVT  Discussed results with patient.  His leg pain is likely sciatica.  Will treat with steroids, pain medicine and lidocaine  patches.  He has Ortho follow-up in a few days.  Regarding the discoloration to his feet.  Likely from rubbing footwear.  No evidence of infection or any acute abnormality.  Will have him follow-up outpatient, return for any worsening symptoms  The patient has been appropriately medically  screened and/or stabilized in the ED. I have low suspicion for any other emergent medical condition which would require further screening, evaluation or treatment in the ED or require inpatient management.  Patient is hemodynamically stable and in no acute distress.  Patient able to ambulate in department prior to ED.  Evaluation does not show acute pathology that would require ongoing or additional emergent interventions while in the emergency department or further inpatient treatment.  I have discussed the diagnosis with the patient and answered all questions.  Pain  is been managed while in the emergency department and patient has no further complaints prior to discharge.  Patient is comfortable with plan discussed in room and is stable for discharge at this time.  I have discussed strict return precautions for returning to the emergency department.  Patient was encouraged to follow-up with PCP/specialist refer to at discharge.    Clinical Course as of 10/14/23 2006  Mon Oct 14, 2023  [address] [facility] [BH]    Clinical Course User Index [BH] Edie Rosebud LABOR, PA-C                                 Medical Decision Making Amount and/or Complexity of Data Reviewed External Data Reviewed: labs, radiology and notes. Radiology: ordered and independent interpretation performed. Decision-making details documented in ED Course.  Risk OTC drugs. Prescription drug management. Decision regarding hospitalization. Diagnosis or treatment significantly limited by social determinants of health.       Final diagnoses:  Sciatica of right side    ED Discharge Orders          Ordered    oxyCODONE -acetaminophen  (PERCOCET/ROXICET) 5-325 MG tablet  Every 6 hours PRN        10/14/23 2003    lidocaine  (LIDODERM ) 5 %  Every 24 hours        10/14/23 2003    predniSONE  (STERAPRED UNI-PAK 21 TAB) 10 MG (21) TBPK tablet  Daily        10/14/23 [address], Claron Rosencrans A, PA-C 10/14/23  2006    Dreama Longs, MD 10/15/23 1039

## 2023-10-14 NOTE — ED Triage Notes (Signed)
 C/o possible clot in both feet since last night. Points to small discolored area. No swelling or warmth noted in feet.

## 2023-10-14 NOTE — Discharge Instructions (Signed)
 It was a pleasure taking care of you here today  Your ultrasound did not show any significant findings  You likely have sciatica which is a cause of your right leg pain.  I have given you a steroid taper pack, lidocaine  patches and pain medicine.  Keep your orthopedic appointment in a few days.  Return for any worsening symptoms

## 2023-10-17 ENCOUNTER — Ambulatory Visit (INDEPENDENT_AMBULATORY_CARE_PROVIDER_SITE_OTHER): Payer: MEDICAID | Admitting: Physical Medicine and Rehabilitation

## 2023-10-17 ENCOUNTER — Encounter: Payer: Self-pay | Admitting: Physical Medicine and Rehabilitation

## 2023-10-17 ENCOUNTER — Other Ambulatory Visit (INDEPENDENT_AMBULATORY_CARE_PROVIDER_SITE_OTHER): Payer: MEDICAID

## 2023-10-17 DIAGNOSIS — G894 Chronic pain syndrome: Secondary | ICD-10-CM

## 2023-10-17 DIAGNOSIS — M961 Postlaminectomy syndrome, not elsewhere classified: Secondary | ICD-10-CM

## 2023-10-17 DIAGNOSIS — M545 Low back pain, unspecified: Secondary | ICD-10-CM | POA: Diagnosis not present

## 2023-10-17 DIAGNOSIS — S39012A Strain of muscle, fascia and tendon of lower back, initial encounter: Secondary | ICD-10-CM

## 2023-10-17 NOTE — Progress Notes (Signed)
 Justin [name] - 58 y.o. male MRN [id number]  Date of birth: 20-Feb-1966  Office Visit Note: Visit Date: 10/17/2023 PCP: [facility] Referred by: Mayers, Kirk RAMAN, PA-C  Subjective: Chief Complaint  Patient presents with   Lower Back - Pain   HPI: Justin [name] is a 58 y.o. male who comes in today per the request of Dr. Kirk Sage, PA for evaluation of chronic, worsening and severe bilateral lower back pain. Pain ongoing for several years, worsened over the past couple of months after mechanical fall outside at apartment complex in June. States he slipped on sewer water, no injuries noted. He was evaluated at urgent care post fall. His lower back pain worsens with prolonged sitting and driving. He describes pain as sharp sensation, currently rates as 9 out of 10. Some relief of pain with home exercise regimen, rest and use of medications. He is currently taking Percocet that he was prescribed after emergency room visit several days ago. No relief of pain with flexeril  and gabapentin . No history of formal physical therapy. Lumbar MRI imaging from September of shows posterior lumbar interbody fusion at L5-S1, no severe nerve impingement, no high grade spinal canal stenosis. History of multiple lumbar surgeries, ultimately did have spinal cord stimulator placed [employer]). Surgeries can be further reviewed under procedures tab. Patient denies focal weakness, numbness and tingling.     Review of Systems  Musculoskeletal:  Positive for back pain.  Neurological:  Negative for tingling, sensory change, focal weakness and weakness.  All other systems reviewed and are negative.  Otherwise per HPI.  Assessment & Plan: Visit Diagnoses:    ICD-10-CM   1. Chronic bilateral low back pain without sciatica  M54.50 XR Lumbar Spine 2-3 Views   G89.29 XR Thoracic Spine 2 View    2. Acute myofascial strain of lumbar region, initial encounter  S39.012A     3. Post laminectomy syndrome   M96.1 XR Lumbar Spine 2-3 Views    XR Thoracic Spine 2 View    4. Chronic pain syndrome  G89.4 XR Lumbar Spine 2-3 Views    XR Thoracic Spine 2 View       Plan: Findings:  Chronic, worsening and severe bilateral lower back pain. His pain increased after mechanical fall in June. Patient continues to have severe pain despite good conservative therapies such as home exercise regimen, rest and use of medications. Patients clinical presentation and exam are complex. His pain could be more of a myofascial strain related to fall. Other differentials include facet mediated pain, there is facet arthropathy noted at L4-L5. I would like to arrange for his spinal cord stimulator to be re-programmed. I will reach out to representative with [facility] to get this visit scheduled. I also obtained thoracic and lumbar radiographs in the office today. No fractures noted to thoracic/lumbar spine. He could also follow up with Dr. Colon to get his opinion. Other options include re-grouping with formal physical therapy and chronic pain management. Patient can continue with current medication regimen. He has no questions at this time. No red flag symptoms noted on exam today.     Meds & Orders: No orders of the defined types were placed in this encounter.   Orders Placed This Encounter  Procedures   XR Lumbar Spine 2-3 Views   XR Thoracic Spine 2 View    Follow-up: Return for Nurse visit for spinal cord stimulator re-programming.   Procedures: No procedures performed  Clinical History: Narrative & Impression CLINICAL DATA:  Chronic low back pain.   EXAM: MRI LUMBAR SPINE WITHOUT AND WITH CONTRAST   TECHNIQUE: Multiplanar and multiecho pulse sequences of the lumbar spine were obtained without and with intravenous contrast.   CONTRAST:  9mL GADAVIST  GADOBUTROL  1 MMOL/ML IV SOLN   COMPARISON:  11/21/2018   FINDINGS: Segmentation:  Standard.   Alignment:  Physiologic.   Vertebrae: No  acute fracture, evidence of discitis, or aggressive bone lesion.   Conus medullaris and cauda equina: Conus extends to the L1 level. Conus and cauda equina appear normal.   Paraspinal and other soft tissues: No acute paraspinal abnormality.   Disc levels:   Disc spaces: Posterior lumbar interbody fusion at L5-S1. Remainder the disc spaces are maintained.   T12-L1: No significant disc bulge. No neural foraminal stenosis. No central canal stenosis.   L1-L2: No significant disc bulge. No neural foraminal stenosis. No central canal stenosis.   L2-L3: No significant disc bulge. Mild bilateral facet arthropathy. No foraminal or central canal stenosis.   L3-L4: No significant disc bulge. No neural foraminal stenosis. No central canal stenosis.   L4-L5: Broad-based disc bulge. Mild bilateral facet arthropathy. No foraminal stenosis. Mild right lateral recess narrowing. No spinal stenosis.   L5-S1: Interbody fusion. Prior laminectomy. No foraminal or central canal stenosis.   IMPRESSION: 1. At L4-5 there is a broad-based disc bulge. Mild bilateral facet arthropathy. No foraminal stenosis. Mild right lateral recess narrowing. 2. Posterior lumbar interbody fusion at L5-S1. No foraminal or central canal stenosis. 3. No acute osseous injury of the lumbar spine.     Electronically Signed   By: Julaine Blanch M.D.   On: 12/24/2022 09:54   He reports that he has never smoked. He has been exposed to tobacco smoke. He has never used smokeless tobacco. No results for input(s): HGBA1C, LABURIC in the last 8760 hours.  Objective:  VS:  HT:    WT:   BMI:     BP:   HR: bpm  TEMP: ( )  RESP:  Physical Exam Vitals and nursing note reviewed.  HENT:     Head: Normocephalic and atraumatic.     Right Ear: External ear normal.     Left Ear: External ear normal.     Nose: Nose normal.     Mouth/Throat:     Mouth: Mucous membranes are moist.  Eyes:     Extraocular Movements:  Extraocular movements intact.  Cardiovascular:     Rate and Rhythm: Normal rate.     Pulses: Normal pulses.  Pulmonary:     Effort: Pulmonary effort is normal.  Abdominal:     General: Abdomen is flat. There is no distension.  Musculoskeletal:        General: Tenderness present.     Cervical back: Normal range of motion.     Comments: Patient rises from seated position to standing without difficulty. Pain noted with facet loading and lumbar extension. 5/5 strength noted with bilateral hip flexion, knee flexion/extension, ankle dorsiflexion/plantarflexion and EHL. No clonus noted bilaterally. No pain upon palpation of greater trochanters. No pain with internal/external rotation of bilateral hips. Sensation intact bilaterally. Myofascial tenderness noted to bilateral lumbar paraspinal regions. Negative slump test bilaterally. Ambulates without aid, gait steady.     Skin:    General: Skin is warm and dry.     Capillary Refill: Capillary refill takes less than 2 seconds.  Neurological:     General: No focal deficit present.  Mental Status: He is alert and oriented to person, place, and time.  Psychiatric:        Mood and Affect: Mood normal.        Behavior: Behavior normal.     Ortho Exam  Imaging: XR Lumbar Spine 2-3 Views Result Date: 10/17/2023 AP and lateral radiographs of lumbar spine show posterior lumbar interbody fusion at L5-S1. No spondylolisthesis. No significant biforaminal narrowing noted. No fractures noted.  XR Thoracic Spine 2 View Result Date: 10/17/2023 AP and lateral radiographs show normal thoracic kyphosis. Bilateral percutaneous spinal cord stimulator leads noted at superior aspect of T7 vertebral body. No fractures noted.     Past Medical/Family/Surgical/Social History: Medications & Allergies reviewed per EMR, new medications updated. Patient Active Problem List   Diagnosis Date Noted   ICD (implantable cardioverter-defibrillator) in place 03/01/2023    Painful orthopaedic hardware (HCC) 01/22/2023   Pain in right leg 08/06/2022   Fatigue 07/31/2022   History of DVT (deep vein thrombosis) 07/17/2022   Tremors of nervous system 07/17/2022   Nonischemic cardiomyopathy (HCC) 05/18/2022   HFrEF (heart failure with reduced ejection fraction) (HCC) 05/18/2022   Snoring 04/04/2022   Normocytic anemia 12/22/2021   History of pulmonary embolism 10/19/2021   Status post lumbar spinal fusion 01/03/2018   Lumbar stenosis 12/23/2017   Spondylolisthesis, lumbar region 06/25/2017   Achilles tendinitis of left lower extremity 12/07/2016   Achilles tendinitis, left leg 05/17/2016   Obstructive sleep apnea on CPAP 03/07/2015   Achilles rupture, left 10/21/2014   Bipolar disorder, unspecified (HCC) 01/07/2013   Headache 01/07/2013   Chronic anticoagulation 01/04/2013   Past Medical History:  Diagnosis Date   Achilles rupture, left    Acute deep vein thrombosis (DVT) of right peroneal vein (HCC) 10/19/2021   Bipolar 1 disorder (HCC)    CHF (congestive heart failure) (HCC)    Dental caries    periodontitis   Depression    Hypertension    Pneumonia    Pulmonary embolism (HCC) 05/18/2007   SDH (subdural hematoma) (HCC) 01/04/2013   Sleep apnea    wears CPAP   Subdural hematoma (HCC) 01/04/2013   in setting of supratherapeutic INR   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   Family History  Problem Relation Age of Onset   Hypertension Mother    Stroke Mother    Multiple sclerosis Sister    Down syndrome Son    Past Surgical History:  Procedure Laterality Date   ACHILLES TENDON SURGERY Left 10/21/2014   Procedure: Left Achilles Reconstruction;  Surgeon: Jerona Harden GAILS, MD;  Location: [facility];  Service: Orthopedics;  Laterality: Left;   APPENDECTOMY     CARDIAC CATHETERIZATION  03/04/2018   [facility]: Normal coronaries, LVEF estimated at 40%, medical Rx   CRANIOTOMY N/A 01/19/2013   Procedure: CRANIOTOMY HEMATOMA EVACUATION  SUBDURAL;  Surgeon: Lamar LELON Peaches, MD;  Location: [facility] NEURO [facility]S;  Service: Neurosurgery;  Laterality: N/A;   CYSTECTOMY     right head   ELBOW SURGERY     right   FRACTURE SURGERY Right    finger- 1st digit right hand   HARDWARE REMOVAL Right 01/22/2023   Procedure: REMOVAL OF RIGHT LUMBAR FIVE-SACRAL ONE HARDWARE;  Surgeon: Colon Shove, MD;  Location: [facility] OR;  Service: Neurosurgery;  Laterality: Right;   I & D EXTREMITY Left 12/07/2016   Procedure: LEFT ACHILLES DEBRIDEMENT;  Surgeon: Harden Jerona GAILS, MD;  Location: [facility] OR;  Service: Orthopedics;  Laterality: Left;   LIPOMA EXCISION Left 01/22/2023  Procedure: RESECTION OF LIPOMA;  Surgeon: Colon Shove, MD;  Location: [facility] OR;  Service: Neurosurgery;  Laterality: Left;   LUMBAR FUSION  12/23/2017   L5 GILL PROCEDURE, RIGHT L5-S1, TRANSFORAMIAL LUMBAR INTERBODY FUSION, BILATERAL LATERAL FUSION, PEDICLE INSTRUMENTATION   MULTIPLE EXTRACTIONS WITH ALVEOLOPLASTY N/A 03/07/2015   Procedure: MULTIPLE EXTRACTION WITH ALVEOLOPLASTY;  Surgeon: Glendia Primrose, DDS;  [facility];  Service: Oral Surgery;  Laterality: N/A;   RIGHT/LEFT HEART CATH AND CORONARY ANGIOGRAPHY N/A 05/18/2022   Procedure: RIGHT/LEFT HEART CATH AND CORONARY ANGIOGRAPHY;  Surgeon: Rolan Ezra RAMAN, MD;  Location: [facility] INVASIVE CV LAB;  Service: Cardiovascular;  Laterality: N/A;   SPINAL CORD STIMULATOR INSERTION N/A 05/18/2019   Procedure: LUMBAR SPINAL CORD STIMULATOR INSERTION;  Surgeon: Mindi Mt, MD;  Location: [facility] OR;  Service: Neurosurgery;  Laterality: N/A;  Thoracic/Lumbar   SPINAL CORD STIMULATOR INSERTION N/A 04/06/2020   Procedure: Revision of spinal cord stimulator;  Surgeon: Darlis Deatrice RAMAN, MD;  Location: [facility] OR;  Service: Neurosurgery;  Laterality: N/A;   SPINAL CORD STIMULATOR REMOVAL N/A 10/30/2022   Procedure: SCS REVISION OF GENERATOR;  Surgeon: Colon Shove, MD;  Location[facility];  Service: Neurosurgery;  Laterality: N/A;  3C   SUBQ ICD IMPLANT N/A  03/01/2023   Procedure: SUBQ ICD IMPLANT;  Surgeon: Cindie Ole DASEN, MD;  Location: [facility] INVASIVE CV LAB;  Service: Cardiovascular;  Laterality: N/A;   TEE WITHOUT CARDIOVERSION N/A 05/18/2022   Procedure: TRANSESOPHAGEAL ECHOCARDIOGRAM (TEE);  Surgeon: Rolan Ezra RAMAN, MD;  Location: [facility] ENDOSCOPY;  Service: Cardiovascular;  Laterality: N/A;   Social History   Occupational History   Occupation: disability  Tobacco Use   Smoking status: Never    Passive exposure: Past   Smokeless tobacco: Never  Vaping Use   Vaping status: Never Used  Substance and Sexual Activity   Alcohol use: No   Drug use: No   Sexual activity: Not on file

## 2023-10-17 NOTE — Progress Notes (Signed)
 Pain Scale   Average Pain 9 Patient advising he has lower back pain radiating to his right hip area. Patient advising his pain is constant.        +Driver, -BT, -Dye Allergies.

## 2023-10-28 ENCOUNTER — Ambulatory Visit (INDEPENDENT_AMBULATORY_CARE_PROVIDER_SITE_OTHER): Payer: MEDICAID

## 2023-10-28 DIAGNOSIS — I5022 Chronic systolic (congestive) heart failure: Secondary | ICD-10-CM | POA: Diagnosis not present

## 2023-10-29 ENCOUNTER — Telehealth: Payer: Self-pay | Admitting: Cardiology

## 2023-10-29 NOTE — Telephone Encounter (Signed)
 Spoke with pt via phone regarding a return to work note. Pt stated he has not worked since the placement of his defibrillator in December of 2024 and that he needs a note releasing him to work. Pt was told his request would be sent to Jodie Passey, PA-C and his covering for their assistance. Pt verbalized understanding. All questions if any were answered.

## 2023-10-29 NOTE — Telephone Encounter (Signed)
 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  Patient states he needs a note from cardiology stating he can return to work without restrictions (if appropriate). He just completed orientation and needs this return to work note to get put on the schedule before school starts.

## 2023-10-29 NOTE — Telephone Encounter (Signed)
 Reason for walk-in: Walk-in Reasons: form/record drop-off (for provider mailbox or FMLA, etc.) Pt requesting letter to return to work.  If patient is requesting to be seen today, or if patient is having symptoms:  What symptoms are being reported (if any)?  N/A  Route to triage pool and ensure Teams message has been sent to the Triage Walk-In chat.  3.   For medication samples, medication refills, HIM requests, appointment requests, lab-related requests, or form/record drop-off, please route to the appropriate pool.

## 2023-10-30 ENCOUNTER — Encounter: Payer: Self-pay | Admitting: Physician Assistant

## 2023-10-30 LAB — CUP PACEART REMOTE DEVICE CHECK
Battery Remaining Percentage: 92 %
Date Time Interrogation Session: 20250811173300
HighPow Impedance: 75 Ohm
Implantable Lead Connection Status: 753985
Implantable Lead Implant Date: 20241213
Implantable Lead Location: 753862
Implantable Lead Model: 3501
Implantable Lead Serial Number: 238967
Implantable Pulse Generator Implant Date: 20241213
Pulse Gen Serial Number: 316377

## 2023-11-01 ENCOUNTER — Telehealth: Payer: Self-pay | Admitting: Cardiology

## 2023-11-01 NOTE — Telephone Encounter (Signed)
 Paperwork printed and given to pt

## 2023-11-01 NOTE — Telephone Encounter (Signed)
 Pt here to pick up return to work form.

## 2023-11-05 ENCOUNTER — Other Ambulatory Visit: Payer: Self-pay | Admitting: Hematology and Oncology

## 2023-11-05 DIAGNOSIS — Z86718 Personal history of other venous thrombosis and embolism: Secondary | ICD-10-CM

## 2023-11-05 NOTE — Progress Notes (Signed)
 No show

## 2023-11-06 ENCOUNTER — Inpatient Hospital Stay: Payer: MEDICAID | Admitting: Hematology and Oncology

## 2023-11-06 ENCOUNTER — Inpatient Hospital Stay: Payer: MEDICAID

## 2023-11-06 DIAGNOSIS — Z86718 Personal history of other venous thrombosis and embolism: Secondary | ICD-10-CM

## 2023-11-06 DIAGNOSIS — M79604 Pain in right leg: Secondary | ICD-10-CM

## 2023-11-06 DIAGNOSIS — I82451 Acute embolism and thrombosis of right peroneal vein: Secondary | ICD-10-CM

## 2023-11-14 ENCOUNTER — Ambulatory Visit (HOSPITAL_COMMUNITY)
Admission: EM | Admit: 2023-11-14 | Discharge: 2023-11-14 | Disposition: A | Discharge status: Home / Self Care | Payer: MEDICAID | Attending: Family Medicine | Admitting: Family Medicine

## 2023-11-14 ENCOUNTER — Encounter (HOSPITAL_COMMUNITY): Payer: Self-pay

## 2023-11-14 DIAGNOSIS — M5416 Radiculopathy, lumbar region: Secondary | ICD-10-CM | POA: Diagnosis not present

## 2023-11-14 MED ORDER — METHYLPREDNISOLONE 4 MG PO TBPK
ORAL_TABLET | ORAL | 0 refills | Status: DC
Start: 2023-11-14 — End: 2023-11-30

## 2023-11-14 MED ORDER — HYDROCODONE-ACETAMINOPHEN 5-325 MG PO TABS: 1.0000 | ORAL_TABLET | Freq: Four times a day (QID) | ORAL | 0 refills | Status: DC | PRN

## 2023-11-14 NOTE — ED Triage Notes (Signed)
 Patient c/o right lower back pain and states the pain radiates down the right leg to the knee. Patient states when driving his right leg tightens and becomes numb x 4 days.  Patient denies taking any medication for his symptoms.

## 2023-11-14 NOTE — Discharge Instructions (Signed)
 Be aware, you have been prescribed pain medications that may cause drowsiness. While taking this medication, do not take any other medications containing acetaminophen (Tylenol). Do not combine with alcohol or recreational drugs. Please do not drive, operate heavy machinery, or take part in activities that require making important decisions while on this medication as your judgement may be clouded.

## 2023-11-14 NOTE — ED Provider Notes (Signed)
 [facility]   [id number] 11/14/23 Arrival Time: 1438  ASSESSMENT & PLAN:  1. Acute radicular low back pain    Able to ambulate here and hemodynamically stable. No indication for plain imaging of back at this time.  Meds ordered this encounter  Medications   methylPREDNISolone  (MEDROL  DOSEPAK) 4 MG TBPK tablet    Sig: Take as directed.    Dispense:  1 each    Refill:  0   HYDROcodone -acetaminophen  (NORCO/VICODIN) 5-325 MG tablet    Sig: Take 1 tablet by mouth every 6 (six) hours as needed for moderate pain (pain score 4-6) or severe pain (pain score 7-10).    Dispense:  10 tablet    Refill:  0   Work/school excuse note: not needed. Medication sedation precautions given. Encourage ROM/movement as tolerated.  Recommend:  Follow-up Information     Schedule an appointment as soon as possible for a visit  with [facility].   Why: If worsening or failing to improve as anticipated. Contact information: [address] [place] [address] [phone]                Reviewed expectations re: course of current medical issues. Questions answered. Outlined signs and symptoms indicating need for more acute intervention. Patient verbalized understanding. After Visit Summary given.   SUBJECTIVE: History from: patient.  Justin [name] is a 58 y.o. male who presents with complaint of right lower back pain that radiates down the right leg to the knee. Patient states when driving his right leg tightens and becomes numb; first noted approx 4 days ago. Denies back injury. Normal bowel/bladder habit changes.   OBJECTIVE:  Vitals:   11/14/23 1453  BP: 98/74  Pulse: 95  Resp: 16  Temp: 98.6 F (37 C)  TempSrc: Oral  SpO2: 95%    General appearance: alert; no distress HEENT: [place]; AT Neck: supple with FROM; without midline tenderness CV: regular Lungs: unlabored respirations; speaks full sentences without difficulty Abdomen: soft, non-tender;  non-distended Back: no specific tenderness to palpation; FROM at waist; bruising: none; without midline tenderness; does report pain with SLR Extremities: without edema; symmetrical without gross deformities; normal ROM of bilateral LE Skin: warm and dry Neurologic: normal gait; normal sensation and strength of bilateral LE Psychological: alert and cooperative; normal mood and affect   Allergies  Allergen Reactions   Lyrica [Pregabalin] Other (See Comments)    Hallucinations    Past Medical History:  Diagnosis Date   Achilles rupture, left    Acute deep vein thrombosis (DVT) of right peroneal vein (HCC) 10/19/2021   Bipolar 1 disorder (HCC)    CHF (congestive heart failure) (HCC)    Dental caries    periodontitis   Depression    Hypertension    Pneumonia    Pulmonary embolism (HCC) 05/18/2007   SDH (subdural hematoma) (HCC) 01/04/2013   Sleep apnea    wears CPAP   Subdural hematoma (HCC) 01/04/2013   in setting of supratherapeutic INR   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   Social History   Socioeconomic History   Marital status: Significant Other    Spouse name: Not on file   Number of children: 4   Years of education: Not on file   Highest education level: Not on file  Occupational History   Occupation: disability  Tobacco Use   Smoking status: Never    Passive exposure: Past   Smokeless tobacco: Never  Vaping Use   Vaping status: Never Used  Substance and Sexual Activity   Alcohol use: No   Drug use: No   Sexual activity: Not on file  Other Topics Concern   Not on file  Social History Narrative   [place]    Lives in a 2 story apartment.  His cousin is currently staying with him.  Has 4 children.  On disability for the past 10 years for pulmonary embolism, bipolar disease.  Education: high school.   Social Drivers of Corporate investment banker Strain: Not on file  Food Insecurity: Patient Declined (03/01/2023)   Hunger Vital Sign     Worried About Running Out of Food in the Last Year: Patient declined    Ran Out of Food in the Last Year: Patient declined  Transportation Needs: No Transportation Needs (03/01/2023)   PRAPARE - Administrator, Civil Service (Medical): No    Lack of Transportation (Non-Medical): No  Physical Activity: Not on file  Stress: Not on file  Social Connections: Unknown (07/31/2021)   Received from [facility]   Social Network    Social Network: Not on file  Intimate Partner Violence: Not At Risk (03/01/2023)   Humiliation, Afraid, Rape, and Kick questionnaire    Fear of Current or Ex-Partner: No    Emotionally Abused: No    Physically Abused: No    Sexually Abused: No   Family History  Problem Relation Age of Onset   Hypertension Mother    Stroke Mother    Multiple sclerosis Sister    Down syndrome Son    Past Surgical History:  Procedure Laterality Date   ACHILLES TENDON SURGERY Left 10/21/2014   Procedure: Left Achilles Reconstruction;  Surgeon: Jerona Harden GAILS, MD;  Location[facility];  Service: Orthopedics;  Laterality: Left;   APPENDECTOMY     CARDIAC CATHETERIZATION  03/04/2018   [facility]: Normal coronaries, LVEF estimated at 40%, medical Rx   CRANIOTOMY N/A 01/19/2013   Procedure: CRANIOTOMY HEMATOMA EVACUATION SUBDURAL;  Surgeon: Lamar LELON Peaches, MD;  Location: [facility];  Service: Neurosurgery;  Laterality: N/A;   CYSTECTOMY     right head   ELBOW SURGERY     right   FRACTURE SURGERY Right    finger- 1st digit right hand   HARDWARE REMOVAL Right 01/22/2023   Procedure: REMOVAL OF RIGHT LUMBAR FIVE-SACRAL ONE HARDWARE;  Surgeon: Colon Shove, MD;  Location: [facility];  Service: Neurosurgery;  Laterality: Right;   I & D EXTREMITY Left 12/07/2016   Procedure: LEFT ACHILLES DEBRIDEMENT;  Surgeon: Harden Jerona GAILS, MD;  Location: [facility] OR;  Service: Orthopedics;  Laterality: Left;   LIPOMA EXCISION Left 01/22/2023   Procedure: RESECTION OF LIPOMA;  Surgeon:  Colon Shove, MD;  Location[facility];  Service: Neurosurgery;  Laterality: Left;   LUMBAR FUSION  12/23/2017   L5 GILL PROCEDURE, RIGHT L5-S1, TRANSFORAMIAL LUMBAR INTERBODY FUSION, BILATERAL LATERAL FUSION, PEDICLE INSTRUMENTATION   MULTIPLE EXTRACTIONS WITH ALVEOLOPLASTY N/A 03/07/2015   Procedure: MULTIPLE EXTRACTION WITH ALVEOLOPLASTY;  Surgeon: Glendia Primrose, DDS;  Location: [facility] OR;  Service: Oral Surgery;  Laterality: N/A;   RIGHT/LEFT HEART CATH AND CORONARY ANGIOGRAPHY N/A 05/18/2022   Procedure: RIGHT/LEFT HEART CATH AND CORONARY ANGIOGRAPHY;  Surgeon: Rolan Ezra RAMAN, MD;  Location: [facility] INVASIVE CV LAB;  Service: Cardiovascular;  Laterality: N/A;   SPINAL CORD STIMULATOR INSERTION N/A 05/18/2019   Procedure: LUMBAR SPINAL CORD STIMULATOR INSERTION;  Surgeon: Mindi Mt, MD;  Location: [facility] OR;  Service: Neurosurgery;  Laterality: N/A;  Thoracic/Lumbar   SPINAL CORD STIMULATOR INSERTION N/A 04/06/2020   Procedure: Revision of spinal cord stimulator;  Surgeon: Darlis Deatrice RAMAN, MD;  Location: [facility] OR;  Service: Neurosurgery;  Laterality: N/A;   SPINAL CORD STIMULATOR REMOVAL N/A 10/30/2022   Procedure: SCS REVISION OF GENERATOR;  Surgeon: Colon Shove, MD;  Location[facility];  Service: Neurosurgery;  Laterality: N/A;  3C   SUBQ ICD IMPLANT N/A 03/01/2023   Procedure: SUBQ ICD IMPLANT;  Surgeon: Cindie Ole DASEN, MD;  Location: [facility] INVASIVE CV LAB;  Service: Cardiovascular;  Laterality: N/A;   TEE WITHOUT CARDIOVERSION N/A 05/18/2022   Procedure: TRANSESOPHAGEAL ECHOCARDIOGRAM (TEE);  Surgeon: Rolan Ezra RAMAN, MD;  Location: [facility] ENDOSCOPY;  Service: Cardiovascular;  Laterality: N/A;      Rolinda Rogue, MD 11/14/23 [phone]

## 2023-11-21 ENCOUNTER — Other Ambulatory Visit (HOSPITAL_COMMUNITY): Payer: Self-pay | Admitting: Cardiology

## 2023-11-30 ENCOUNTER — Encounter (HOSPITAL_COMMUNITY): Payer: Self-pay

## 2023-11-30 ENCOUNTER — Ambulatory Visit (HOSPITAL_COMMUNITY)
Admission: EM | Admit: 2023-11-30 | Discharge: 2023-11-30 | Disposition: A | Discharge status: Home / Self Care | Payer: MEDICAID | Attending: Emergency Medicine | Admitting: Emergency Medicine

## 2023-11-30 DIAGNOSIS — M79661 Pain in right lower leg: Secondary | ICD-10-CM | POA: Diagnosis present

## 2023-11-30 DIAGNOSIS — Z86718 Personal history of other venous thrombosis and embolism: Secondary | ICD-10-CM | POA: Diagnosis present

## 2023-11-30 LAB — COMPREHENSIVE METABOLIC PANEL WITH GFR
ALT: 15 U/L (ref 0–44)
AST: 29 U/L (ref 15–41)
Albumin: 4 g/dL (ref 3.5–5.0)
Alkaline Phosphatase: 93 U/L (ref 38–126)
Anion gap: 10 (ref 5–15)
BUN: 8 mg/dL (ref 6–20)
CO2: 24 mmol/L (ref 22–32)
Calcium: 9 mg/dL (ref 8.9–10.3)
Chloride: 105 mmol/L (ref 98–111)
Creatinine, Ser: 1.3 mg/dL — ABNORMAL HIGH (ref 0.61–1.24)
GFR, Estimated: >60 mL/min (ref >60)
Glucose, Bld: 94 mg/dL (ref 70–99)
Potassium: 4.7 mmol/L (ref 3.5–5.1)
Sodium: 139 mmol/L (ref 135–145)
Total Bilirubin: 0.7 mg/dL (ref 0.0–1.2)
Total Protein: 7.3 g/dL (ref 6.5–8.1)

## 2023-11-30 LAB — CBC WITH DIFFERENTIAL/PLATELET
Abs Immature Granulocytes: 0.01 K/uL (ref 0.00–0.07)
Basophils Absolute: 0.1 K/uL (ref 0.0–0.1)
Basophils Relative: 1 %
Eosinophils Absolute: 0.1 K/uL (ref 0.0–0.5)
Eosinophils Relative: 2 %
HCT: 41.3 % (ref 39.0–52.0)
Hemoglobin: 13.7 g/dL (ref 13.0–17.0)
Immature Granulocytes: 0 %
Lymphocytes Relative: 33 %
Lymphs Abs: 2 K/uL (ref 0.7–4.0)
MCH: 27.6 pg (ref 26.0–34.0)
MCHC: 33.2 g/dL (ref 30.0–36.0)
MCV: 83.3 fL (ref 80.0–100.0)
Monocytes Absolute: 0.4 K/uL (ref 0.1–1.0)
Monocytes Relative: 7 %
Neutro Abs: 3.4 K/uL (ref 1.7–7.7)
Neutrophils Relative %: 57 %
Platelets: 229 K/uL (ref 150–400)
RBC: 4.96 MIL/uL (ref 4.22–5.81)
RDW: 13.6 % (ref 11.5–15.5)
WBC: 6.1 K/uL (ref 4.0–10.5)
nRBC: 0 % (ref 0.0–0.2)

## 2023-11-30 LAB — BRAIN NATRIURETIC PEPTIDE: B Natriuretic Peptide: 12.5 pg/mL (ref 0.0–100.0)

## 2023-11-30 NOTE — ED Triage Notes (Addendum)
 Patient here today with c/o right lower leg pain since yesterday. Patient is on a blood thinner and has a h/o blood clots. No known injury. Patient has increased pain with weightbearing.

## 2023-11-30 NOTE — ED Provider Notes (Signed)
 [facility]    CSN: [id number] Arrival date & time: 11/30/23  1153      History   Chief Complaint Chief Complaint  Patient presents with   Leg Pain    HPI Justin [name] is a 58 y.o. male.   Patient presents to clinic over concern of right calf pain that started yesterday.  Is having pain with ambulation in the posterior calf.  Does not notice any obvious swelling.  Has not had any injury, trauma or falls.  History of bipolar 1 disorder, DVT, CHF, depression, hypertension, pneumonia, PE, subdural hematoma, sleep apnea, and has an ICD.   Currently anticoagulated with Pradaxa .  Denies missing doses or late doses.  Was scheduled for basic labs in August, was unable to make it to his appointment.  Has not had any chest pain.  Reports mild shortness of breath.  The history is provided by the patient and medical records.  Leg Pain   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   Painful orthopaedic hardware (HCC) 01/22/2023   Pain in right leg 08/06/2022   Fatigue 07/31/2022   History of DVT (deep vein thrombosis) 07/17/2022   Tremors of nervous system 07/17/2022   Nonischemic cardiomyopathy (HCC) 05/18/2022   HFrEF (heart failure with reduced ejection fraction) (HCC) 05/18/2022   Snoring 04/04/2022   Normocytic anemia 12/22/2021   History of pulmonary embolism 10/19/2021    Status post lumbar spinal fusion 01/03/2018   Lumbar stenosis 12/23/2017   Spondylolisthesis, lumbar region 06/25/2017   Achilles tendinitis of left lower extremity 12/07/2016   Achilles tendinitis, left leg 05/17/2016   Obstructive sleep apnea on CPAP 03/07/2015   Achilles rupture, left 10/21/2014   Bipolar disorder, unspecified (HCC) 01/07/2013   Headache 01/07/2013   Chronic anticoagulation 01/04/2013    Past Surgical History:  Procedure Laterality Date   ACHILLES TENDON SURGERY Left 10/21/2014   Procedure: Left Achilles Reconstruction;  Surgeon: Jerona Harden GAILS, MD;  Location: [facility];  Service: Orthopedics;  Laterality: Left;   APPENDECTOMY     CARDIAC CATHETERIZATION  03/04/2018   [facility]: Normal coronaries, LVEF estimated at 40%, medical Rx   CRANIOTOMY N/A 01/19/2013   Procedure: CRANIOTOMY HEMATOMA EVACUATION SUBDURAL;  Surgeon: Lamar LELON Peaches, MD;  Location: [facility];  Service: Neurosurgery;  Laterality: N/A;   CYSTECTOMY     right head   ELBOW SURGERY     right   FRACTURE SURGERY Right    finger- 1st digit right hand   HARDWARE REMOVAL Right 01/22/2023   Procedure: REMOVAL OF RIGHT LUMBAR FIVE-SACRAL ONE HARDWARE;  Surgeon: Colon Shove, MD;  Location: [facility];  Service: Neurosurgery;  Laterality: Right;   I & D EXTREMITY Left 12/07/2016   Procedure: LEFT ACHILLES DEBRIDEMENT;  Surgeon: Harden Jerona GAILS, MD;  Location: [facility] OR;  Service: Orthopedics;  Laterality: Left;   LIPOMA EXCISION Left 01/22/2023   Procedure: RESECTION OF LIPOMA;  Surgeon: Colon Shove, MD;  Location: [facility];  Service: Neurosurgery;  Laterality: Left;   LUMBAR FUSION  12/23/2017   L5 GILL PROCEDURE, RIGHT L5-S1, TRANSFORAMIAL LUMBAR INTERBODY FUSION, BILATERAL LATERAL FUSION, PEDICLE INSTRUMENTATION   MULTIPLE EXTRACTIONS WITH ALVEOLOPLASTY N/A 03/07/2015   Procedure: MULTIPLE EXTRACTION WITH ALVEOLOPLASTY;  Surgeon: Glendia Primrose, DDS;  Location: [facility];  Service: Oral Surgery;  Laterality:  N/A;   RIGHT/LEFT HEART CATH AND CORONARY ANGIOGRAPHY N/A 05/18/2022   Procedure: RIGHT/LEFT HEART CATH AND CORONARY ANGIOGRAPHY;  Surgeon: Rolan Ezra RAMAN, MD;  Location: [facility] INVASIVE CV LAB;  Service: Cardiovascular;  Laterality: N/A;   SPINAL CORD STIMULATOR INSERTION N/A 05/18/2019   Procedure: LUMBAR SPINAL CORD STIMULATOR INSERTION;  Surgeon: Mindi Mt, MD;  Location: [facility] OR;  Service: Neurosurgery;  Laterality: N/A;  Thoracic/Lumbar   SPINAL CORD STIMULATOR INSERTION N/A 04/06/2020   Procedure: Revision of spinal cord stimulator;  Surgeon: Darlis Deatrice RAMAN, MD;  Location: [facility] OR;  Service: Neurosurgery;  Laterality: N/A;   SPINAL CORD STIMULATOR REMOVAL N/A 10/30/2022   Procedure: SCS REVISION OF GENERATOR;  Surgeon: Colon Shove, MD;  Location: [facility] OR;  Service: Neurosurgery;  Laterality: N/A;  3C   SUBQ ICD IMPLANT N/A 03/01/2023   Procedure: SUBQ ICD IMPLANT;  Surgeon: Cindie Ole DASEN, MD;  Location: [facility] INVASIVE CV LAB;  Service: Cardiovascular;  Laterality: N/A;   TEE WITHOUT CARDIOVERSION N/A 05/18/2022   Procedure: TRANSESOPHAGEAL ECHOCARDIOGRAM (TEE);  Surgeon: Rolan Ezra RAMAN, MD;  Location: [facility] ENDOSCOPY;  Service: Cardiovascular;  Laterality: N/A;       Home Medications    Prior to Admission medications   Medication Sig Start Date End Date Taking? Authorizing Provider  carvedilol  (COREG ) 12.5 MG tablet TAKE 1 & 1/2 TABLETS (18.75 MG TOTAL) BY MOUTH 2 (TWO) TIMES DAILY (AM+PM) 11/21/23   Rolan Ezra RAMAN, MD  dabigatran  (PRADAXA ) 150 MG CAPS capsule Take 1 capsule (150 mg total) by mouth 2 (two) times daily. 04/02/23   Rolan Ezra RAMAN, MD  doxepin  (SINEQUAN ) 75 MG capsule Take 75 mg by mouth at bedtime. 07/24/22   [provider]  empagliflozin  (JARDIANCE ) 10 MG TABS tablet Take 1 tablet (10 mg total) by mouth daily before breakfast. 04/02/23   Rolan Ezra RAMAN, MD  HYDROcodone -acetaminophen  (NORCO/VICODIN) 5-325 MG tablet Take 1 tablet by mouth every 6 (six) hours  as needed for moderate pain (pain score 4-6) or severe pain (pain score 7-10). 11/14/23   Rolinda Rogue, MD  hydrOXYzine  (ATARAX ) 25 MG tablet Take 25 mg by mouth 3 (three) times daily as needed for anxiety. 11/21/22   [provider]  mirtazapine  (REMERON ) 45 MG tablet Take 45 mg by mouth at bedtime.    [provider]  pravastatin  (PRAVACHOL ) 10 MG tablet Take 1 tablet (10 mg total) by mouth at bedtime. 04/02/23   Rolan Ezra RAMAN, MD  QUEtiapine  (SEROQUEL  XR) 400 MG 24 hr tablet Take 800 mg by mouth at bedtime. 11/16/21   [provider]  sacubitril -valsartan  (ENTRESTO ) 97-103 MG Take 1 tablet by mouth 2 (two) times daily. 04/02/23   Rolan Ezra RAMAN, MD    Family History Family History  Problem Relation Age of Onset   Hypertension Mother    Stroke Mother    Multiple sclerosis Sister    Down syndrome Son     Social History Social History   Tobacco Use   Smoking status: Never  Passive exposure: Past   Smokeless tobacco: Never  Vaping Use   Vaping status: Never Used  Substance Use Topics   Alcohol use: No   Drug use: No     Allergies   Lyrica [pregabalin]   Review of Systems Review of Systems  Per HPI  Physical Exam Triage Vital Signs ED Triage Vitals  Encounter Vitals Group     BP 11/30/23 1258 120/72     Girls Systolic BP Percentile --      Girls Diastolic BP Percentile --      Boys Systolic BP Percentile --      Boys Diastolic BP Percentile --      Pulse Rate 11/30/23 1258 87     Resp 11/30/23 1258 16     Temp 11/30/23 1258 98.2 F (36.8 C)     Temp Source 11/30/23 1258 Oral     SpO2 11/30/23 1258 96 %     Weight --      Height --      Head Circumference --      Peak Flow --      Pain Score 11/30/23 1300 10     Pain Loc --      Pain Education --      Exclude from Growth Chart --    No data found.  Updated Vital Signs BP 120/72 (BP Location: Left Arm)   Pulse 87   Temp 98.2 F (36.8 C) (Oral)   Resp 16   SpO2 96%    Visual Acuity Right Eye Distance:   Left Eye Distance:   Bilateral Distance:    Right Eye Near:   Left Eye Near:    Bilateral Near:     Physical Exam Vitals and nursing note reviewed.  Constitutional:      Appearance: Normal appearance.  HENT:     Head: Normocephalic and atraumatic.     Right Ear: External ear normal.     Left Ear: External ear normal.     Nose: Nose normal.     Mouth/Throat:     Mouth: Mucous membranes are moist.  Eyes:     Conjunctiva/sclera: Conjunctivae normal.  Cardiovascular:     Rate and Rhythm: Normal rate and regular rhythm.     Heart sounds: Normal heart sounds. No murmur heard. Pulmonary:     Effort: Pulmonary effort is normal. No respiratory distress.     Breath sounds: Normal breath sounds.  Musculoskeletal:        General: Tenderness present. No swelling, deformity or signs of injury.     Right lower leg: No edema.     Left lower leg: No edema.     Comments: Positive homans sign on right side   Skin:    General: Skin is warm and dry.  Neurological:     General: No focal deficit present.     Mental Status: He is alert and oriented to person, place, and time.  Psychiatric:        Mood and Affect: Mood normal.        Behavior: Behavior normal. Behavior is cooperative.      UC Treatments / Results  Labs (all labs ordered are listed, but only abnormal results are displayed) Labs Reviewed  COMPREHENSIVE METABOLIC PANEL WITH GFR  CBC WITH DIFFERENTIAL/PLATELET  BRAIN NATRIURETIC PEPTIDE    EKG   Radiology No results found.  Procedures Procedures (including critical care time)  Medications Ordered in UC Medications - No data to display  Initial  Impression / Assessment and Plan / UC Course  I have reviewed the triage vital signs and the nursing notes.  Pertinent labs & imaging results that were available during my care of the patient were reviewed by me and considered in my medical decision making (see chart for  details).  Vitals and triage reviewed, patient is hemodynamically stable.  Lungs vesicular, heart with regular rate and rhythm.   Does not appear to have pitting edema or calf swelling.  Entire leg is not swollen.  Does have tenderness of the calf and positive Homans' sign.  Currently anticoagulated.  Will send for DVT study to rule this out.  Patient endorses intermittent mild shortness of breath.  Basic labs obtained including BNP.  History of heart failure.  Without pitting edema or edema bilateral lower extremities.  Oxygenation 96% room air, no acute distress and able to speak full sentences.  Plan of care, follow-up care and strict emergency precautions given if symptoms evolve.  No questions at this time.     Final Clinical Impressions(s) / UC Diagnoses   Final diagnoses:  History of DVT (deep vein thrombosis)  Right calf pain     Discharge Instructions      I am scheduling you for an ultrasound to rule out another DVT.  For any pain in the meantime you can take 500 mg of Tylenol  every 6 hours as needed and try to stay off of the leg.  If symptoms persist despite normal imaging please follow-up with your primary care provider early next week.  If symptoms worsen before you can get in for your ultrasound seek immediate care at the nearest emergency department.    ED Prescriptions   None    PDMP not reviewed this encounter.   Dreama, Rosemond Lyttle  N, FNP 11/30/23 1356

## 2023-11-30 NOTE — Discharge Instructions (Addendum)
 I am scheduling you for an ultrasound to rule out another DVT.  For any pain in the meantime you can take 500 mg of Tylenol  every 6 hours as needed and try to stay off of the leg.  If symptoms persist despite normal imaging please follow-up with your primary care provider early next week.  If symptoms worsen before you can get in for your ultrasound seek immediate care at the nearest emergency department.

## 2023-12-01 ENCOUNTER — Ambulatory Visit (HOSPITAL_BASED_OUTPATIENT_CLINIC_OR_DEPARTMENT_OTHER)
Admission: RE | Admit: 2023-12-01 | Discharge: 2023-12-01 | Disposition: A | Discharge status: Home / Self Care | Payer: MEDICAID | Source: Ambulatory Visit | Attending: Emergency Medicine | Admitting: Emergency Medicine

## 2023-12-01 ENCOUNTER — Ambulatory Visit (HOSPITAL_COMMUNITY): Payer: Self-pay | Admitting: Emergency Medicine

## 2023-12-01 ENCOUNTER — Encounter (HOSPITAL_COMMUNITY): Payer: Self-pay | Admitting: Emergency Medicine

## 2023-12-01 ENCOUNTER — Emergency Department (HOSPITAL_COMMUNITY)
Admission: EM | Admit: 2023-12-01 | Discharge: 2023-12-01 | Disposition: A | Discharge status: Home / Self Care | Payer: MEDICAID | Source: Ambulatory Visit | Attending: Emergency Medicine | Admitting: Emergency Medicine

## 2023-12-01 ENCOUNTER — Other Ambulatory Visit: Payer: Self-pay

## 2023-12-01 DIAGNOSIS — I82451 Acute embolism and thrombosis of right peroneal vein: Secondary | ICD-10-CM | POA: Diagnosis not present

## 2023-12-01 DIAGNOSIS — M79604 Pain in right leg: Secondary | ICD-10-CM

## 2023-12-01 DIAGNOSIS — I509 Heart failure, unspecified: Secondary | ICD-10-CM | POA: Diagnosis not present

## 2023-12-01 DIAGNOSIS — I110 Hypertensive heart disease with heart failure: Secondary | ICD-10-CM | POA: Diagnosis not present

## 2023-12-01 DIAGNOSIS — M79661 Pain in right lower leg: Secondary | ICD-10-CM | POA: Insufficient documentation

## 2023-12-01 DIAGNOSIS — Z86718 Personal history of other venous thrombosis and embolism: Secondary | ICD-10-CM | POA: Insufficient documentation

## 2023-12-01 MED ORDER — RIVAROXABAN 20 MG PO TABS: 20.0000 mg | ORAL_TABLET | Freq: Every day | ORAL | 0 refills | Status: AC

## 2023-12-01 MED ORDER — RIVAROXABAN 20 MG PO TABS
20.0000 mg | ORAL_TABLET | Freq: Once | ORAL | Status: AC
  Administered 2023-12-01: 20 mg via ORAL
  Filled 2023-12-01: qty 1

## 2023-12-01 NOTE — Progress Notes (Signed)
 VASCULAR LAB    Right lower extremity venous duplex has been performed.  See CV proc for preliminary results.  Positive for acute DVT,this tech returned to ED for admission and gave verbal report to triage and to [facility], charge RN  RACHEL, ALBERTA, RVT 12/01/2023, 2:19 PM

## 2023-12-01 NOTE — ED Provider Triage Note (Signed)
 Emergency Medicine Provider Triage Evaluation Note  Justin [name] , a 58 y.o. male  was evaluated in triage.  Pt complains of DVT. Here with right lower leg swelling and pain.  He is anticoagulated with Pradaxa , denies any missed doses.  No recent travel or surgeries. Hx of previous DVT, reports that he has been on warfarin and Eliquis  previously before being transitioned to pradaxa  about 1 year ago. Went to UC yesterday and had US  that showed +DVT. He does not think he has seen hematology previously. Denies chest pain or shortness of breath.  Review of Systems  Positive:  Negative:   Physical Exam  BP 109/88 (BP Location: Left Arm)   Pulse 85   Temp 98.7 F (37.1 C)   Resp 16   Ht 5' 9 (1.753 m)   Wt 104.3 kg   SpO2 97%   BMI 33.97 kg/m  Gen:   Awake, no distress   Resp:  Normal effort  MSK:   Moves extremities without difficulty  Other:  RLE TTP, no significant erythema, warmth, or overlying skin changes. DP and PT pulses palpable  Medical Decision Making  Medically screening exam initiated at 3:18 PM.  Appropriate orders placed.  Justin [name] was informed that the remainder of the evaluation will be completed by another provider, this initial triage assessment does not replace that evaluation, and the importance of remaining in the ED until their evaluation is complete.  Chart reviewed, US  yesterday shows  Findings consistent with acute deep vein thrombosis, mid calf, involving  one of the right peroneal veins.   Patient had labs drawn yesterday that were benign Given patient is already on anticoagulation, has failed warfarin and Eliquis  previously, consult placed to hematology for recommendations.   Discussed with oncology Dr. Autumn, recommends discontinuing patients Pradaxa  and starting Xarelto  20 mg daily. Dr. Autumn reports that the patient does not need to start with loading dose of 15 mg twice daily given his reported compliance with Pradaxa .  Xarelto  [name]     Justin [name] A, PA-C 12/02/23 1607

## 2023-12-01 NOTE — Telephone Encounter (Signed)
 Called and verified patient identity with 2 identifiers.    Discussed DVT in the calf.  Encouraged to continue anticoagulants as prescribed and to follow-up w/ Federico (Heme/Onc) who prescribes anticoag.

## 2023-12-01 NOTE — Discharge Instructions (Addendum)
 As we discussed, ultrasound imaging of your right leg showed that you have a new clot in your vessels.  We have talked to our hematologist on-call Dr. Autumn who recommends that we switch you from the Pradaxa  you are taking to a new medicine, Xarelto .  We have given you the first dose of this today.  You need to take this as prescribed every day.  Additionally, you need to follow-up closely with hematology.  I have given you a referral with a number to call to schedule an appointment.  Please do so at your earliest convenience.  I have also attached additional information for management of your symptoms.  You can take Tylenol  as needed for pain.  Call your PCP to schedule a close follow-up appointment as well.  Return if development of any new or worsening symptoms.

## 2023-12-01 NOTE — ED Triage Notes (Signed)
 Pt brought from vascular lab with +DVT in right leg.

## 2023-12-01 NOTE — ED Provider Notes (Signed)
 [place] EMERGENCY DEPARTMENT AT [facility] Provider Note   CSN: [id number] Arrival date & time: 12/01/23  1423     Patient presents with: DVT   Justin [name] is a 58 y.o. male.   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 Reports symptoms started 2 days ago.  Pain is worse with ambulation.  No trauma or falls.  No recent travel or surgeries.  Patient adamantly denies any misses doses of his Pradaxa , reports that he gets the prepackaged pill packs and is therefore able to ensure that he has not missed any doses of his medications.  Reports that he went to urgent care yesterday and had labs drawn and an ultrasound performed and received a call today that he had a new DVT and to come here for evaluation.  Presents for same. Reports that he has been on warfarin and Eliquis  previously before being transitioned to pradaxa  about 1 year ago.  Denies chest pain, shortness of breath unchanged from baseline attributed to chronic CHF.  Denies ever seeing a hematologist previously.  The history is provided by the patient. No language interpreter was used.       Prior to Admission medications   Medication Sig Start Date End Date Taking? Authorizing Provider  carvedilol  (COREG ) 12.5 MG tablet TAKE 1 & 1/2 TABLETS (18.75 MG TOTAL) BY MOUTH 2 (TWO) TIMES DAILY (AM+PM) 11/21/23   Rolan Ezra RAMAN, MD  dabigatran  (PRADAXA ) 150 MG CAPS capsule Take 1 capsule (150 mg total) by mouth 2 (two) times daily. 04/02/23   Rolan Ezra RAMAN, MD  doxepin  (SINEQUAN ) 75 MG capsule Take 75 mg by mouth at bedtime. 07/24/22   [provider]  empagliflozin  (JARDIANCE ) 10 MG TABS tablet Take 1 tablet (10 mg total) by mouth daily before breakfast. 04/02/23   Rolan Ezra RAMAN, MD  HYDROcodone -acetaminophen  (NORCO/VICODIN) 5-325 MG tablet Take 1 tablet by mouth every 6 (six) hours as needed for moderate pain (pain score 4-6) or severe pain (pain  score 7-10). 11/14/23   Rolinda Rogue, MD  hydrOXYzine  (ATARAX ) 25 MG tablet Take 25 mg by mouth 3 (three) times daily as needed for anxiety. 11/21/22   [provider]  mirtazapine  (REMERON ) 45 MG tablet Take 45 mg by mouth at bedtime.    [provider]  pravastatin  (PRAVACHOL ) 10 MG tablet Take 1 tablet (10 mg total) by mouth at bedtime. 04/02/23   Rolan Ezra RAMAN, MD  QUEtiapine  (SEROQUEL  XR) 400 MG 24 hr tablet Take 800 mg by mouth at bedtime. 11/16/21   [provider]  sacubitril -valsartan  (ENTRESTO ) 97-103 MG Take 1 tablet by mouth 2 (two) times daily. 04/02/23   Rolan Ezra RAMAN, MD    Allergies: Lyrica [pregabalin]    Review of Systems  Musculoskeletal:  Positive for myalgias.  All other systems reviewed and are negative.   Updated Vital Signs BP 109/88 (BP Location: Left Arm)   Pulse 85   Temp 98.7 F (37.1 C)   Resp 16   Ht 5' 9 (1.753 m)   Wt 104.3 kg   SpO2 97%   BMI 33.97 kg/m   Physical Exam Vitals and nursing note reviewed.  Constitutional:      General: He is not in acute distress.    Appearance: Normal appearance. He is normal weight. He is not ill-appearing, toxic-appearing or diaphoretic.  HENT:     Head: Normocephalic and atraumatic.  Cardiovascular:  Rate and Rhythm: Normal rate and regular rhythm.     Heart sounds: Normal heart sounds.  Pulmonary:     Effort: Pulmonary effort is normal. No respiratory distress.     Breath sounds: Normal breath sounds.  Musculoskeletal:        General: Normal range of motion.     Cervical back: Normal range of motion.     Comments: TTP noted to the right posterior calf with positive Homans' sign.  DP and PT pulses are intact and 2+.  No significant swelling, erythema, warmth, or overlying skin changes.  No palpable cord.  Skin:    General: Skin is warm and dry.  Neurological:     General: No focal deficit present.     Mental Status: He is alert.  Psychiatric:        Mood and Affect:  Mood normal.        Behavior: Behavior normal.     (all labs ordered are listed, but only abnormal results are displayed) Labs Reviewed - No data to display  EKG: None  Radiology: LE VENOUS Result Date: 12/01/2023  Lower Venous DVT Study Patient Name:  Justin [name]  Date of Exam:   12/01/2023 Medical Rec #: [id number]       Accession #:    [id number] Date of Birth: 03-01-1966       Patient Gender: M Patient Age:   82 years Exam Location:  [facility] Procedure:      VAS US  LOWER EXTREMITY VENOUS (DVT) Referring Phys: GE[place]  GARRISON --------------------------------------------------------------------------------  Indications: Right calf pain/cramping x 3 days.  Risk Factors: DVT 10/13/21. Anticoagulation: Pradaxa . Comparison Study: Prior negative right LEV done 04/30/22 Performing Technologist: Alberta Lis RVS  Examination Guidelines: A complete evaluation includes B-mode imaging, spectral Doppler, color Doppler, and power Doppler as needed of all accessible portions of each vessel. Bilateral testing is considered an integral part of a complete examination. Limited examinations for reoccurring indications may be performed as noted. The reflux portion of the exam is performed with the patient in reverse Trendelenburg.  +---------+---------------+---------+-----------+----------+--------------+ RIGHT    CompressibilityPhasicitySpontaneityPropertiesThrombus Aging +---------+---------------+---------+-----------+----------+--------------+ CFV      Full           Yes      Yes                                 +---------+---------------+---------+-----------+----------+--------------+ SFJ      Full                                                        +---------+---------------+---------+-----------+----------+--------------+ FV Prox  Full                                                        +---------+---------------+---------+-----------+----------+--------------+ FV  Mid   Full                                                        +---------+---------------+---------+-----------+----------+--------------+  FV DistalFull           Yes      Yes                                 +---------+---------------+---------+-----------+----------+--------------+ PFV      Full                                                        +---------+---------------+---------+-----------+----------+--------------+ POP      Full           Yes      Yes                                 +---------+---------------+---------+-----------+----------+--------------+ PTV      Full                                                        +---------+---------------+---------+-----------+----------+--------------+ PERO     Partial                                      Acute          +---------+---------------+---------+-----------+----------+--------------+ Gastroc  Full                                                        +---------+---------------+---------+-----------+----------+--------------+   +----+---------------+---------+-----------+----------+--------------+ LEFTCompressibilityPhasicitySpontaneityPropertiesThrombus Aging +----+---------------+---------+-----------+----------+--------------+ CFV Full           Yes      Yes                                 +----+---------------+---------+-----------+----------+--------------+ SFJ Full                                                        +----+---------------+---------+-----------+----------+--------------+    Summary: RIGHT: - Findings consistent with acute deep vein thrombosis, mid calf, involving one of the right peroneal veins..  - No cystic structure found in the popliteal fossa. - Ultrasound characteristics of enlarged lymph nodes are noted in the groin.  LEFT: - No evidence of common femoral vein obstruction.   *See table(s) above for measurements and observations.    Preliminary       Procedures   Medications Ordered in the ED  rivaroxaban  (XARELTO ) tablet 20 mg (has no administration in time range)                                    Medical Decision Making Risk Prescription drug management.  This patient is a 57 y.o. male who presents to the ED for concern of RLE pain, this involves an extensive number of treatment options, and is a complaint that carries with it a high risk of complications and morbidity. The emergent differential diagnosis prior to evaluation includes, but is not limited to,  muscle strain, DVT, cellulitis . This is not an exhaustive differential.   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  Additional history: Chart reviewed. Pertinent results include: has been on warfarin and Eliquis  previously before being transition to Pradaxa  over 1 year ago.  Unable to visualize any previous hematology notes.  Physical Exam: Physical exam performed. The pertinent findings include: TTP noted to the right posterior calf with positive Homans' sign.  DP and PT pulses are intact and 2+.  No significant swelling, erythema, warmth, or overlying skin changes.  No palpable cord.  Lab Tests: Labs ordered at urgent care yesterday, I have personally reviewed and interpreted these.  Pertinent results include: No acute laboratory abnormalities.  BNP WNL, no leukocytosis or anemia.  Kidney function is at baseline.   Imaging Studies: DVT US  obtained today which has resulted and reveals:   - Findings consistent with acute  deep vein thrombosis, mid calf, involving one of the right peroneal veins..  - No cystic structure found in the popliteal fossa.  - Ultrasound characteristics of enlarged lymph nodes are noted in the groin.    Medications: I ordered medication including Xarelto   for new acute DVT. I have reviewed the patients home medicines and have made adjustments as needed.  Consultations Obtained: I requested consultation with the heme/onc on call Dr. Autumn,  and discussed lab and imaging findings as well as pertinent plan - they recommend: switch to Xarelto  given new acute DVT despite compliance with Pradaxa . Given he has been complaint with a Pradaxa , Dr. Autumn reports patient does not need loading dose of Xarelto  and can start with 20 mg daily. Plan for close outpatient heme follow-up as well.    Disposition: After consideration of the diagnostic results and the patients response to treatment, I feel that emergency department workup does not suggest an emergent condition requiring admission or immediate intervention beyond what has been performed at this time. The plan is: d/c with Xarelto , close outpatient hematology follow-up, and return precautions per hematology recommendations.  Advised him to discontinue his Pradaxa . Evaluation and diagnostic testing in the emergency department does not suggest an emergent condition requiring admission or immediate intervention beyond what has been performed at this time.  Plan for discharge with close PCP follow-up.  Patient is understanding and amenable with plan, educated on red flag symptoms that would prompt immediate return.  Patient discharged in stable condition.  Findings and plan of care discussed with supervising physician Dr. Elnor who is in agreement.   Final diagnoses:  Acute deep vein thrombosis (DVT) of right peroneal vein [facility])    ED Discharge Orders          Ordered    rivaroxaban  (XARELTO ) 20 MG TABS tablet  Daily with supper        12/01/23 1926  An After Visit Summary was printed and given to the patient.      Nora Lauraine LABOR, PA-C 12/01/23 1928    Elnor Jayson LABOR, DO 12/11/23 [phone]

## 2023-12-02 ENCOUNTER — Telehealth: Payer: Self-pay | Admitting: Oncology

## 2023-12-02 NOTE — Telephone Encounter (Signed)
 Pt calling and scheduled appt

## 2023-12-05 ENCOUNTER — Ambulatory Visit: Payer: MEDICAID | Admitting: Oncology

## 2023-12-05 ENCOUNTER — Other Ambulatory Visit: Payer: MEDICAID

## 2023-12-13 ENCOUNTER — Inpatient Hospital Stay (HOSPITAL_BASED_OUTPATIENT_CLINIC_OR_DEPARTMENT_OTHER): Payer: MEDICAID | Admitting: Oncology

## 2023-12-13 ENCOUNTER — Inpatient Hospital Stay: Payer: MEDICAID

## 2023-12-13 VITALS — BP 98/67 | HR 87 | Temp 97.5°F | Resp 18 | Ht 69.0 in | Wt 225.1 lb

## 2023-12-13 DIAGNOSIS — Z86718 Personal history of other venous thrombosis and embolism: Secondary | ICD-10-CM

## 2023-12-13 DIAGNOSIS — Z86711 Personal history of pulmonary embolism: Secondary | ICD-10-CM

## 2023-12-13 DIAGNOSIS — Z7901 Long term (current) use of anticoagulants: Secondary | ICD-10-CM | POA: Diagnosis not present

## 2023-12-13 LAB — CBC WITH DIFFERENTIAL (CANCER CENTER ONLY)
Abs Immature Granulocytes: 0.01 K/uL (ref 0.00–0.07)
Basophils Absolute: 0 K/uL (ref 0.0–0.1)
Basophils Relative: 1 %
Eosinophils Absolute: 0.1 K/uL (ref 0.0–0.5)
Eosinophils Relative: 3 %
HCT: 37.9 % — ABNORMAL LOW (ref 39.0–52.0)
Hemoglobin: 13.1 g/dL (ref 13.0–17.0)
Immature Granulocytes: 0 %
Lymphocytes Relative: 31 %
Lymphs Abs: 1.7 K/uL (ref 0.7–4.0)
MCH: 28.1 pg (ref 26.0–34.0)
MCHC: 34.6 g/dL (ref 30.0–36.0)
MCV: 81.3 fL (ref 80.0–100.0)
Monocytes Absolute: 0.4 K/uL (ref 0.1–1.0)
Monocytes Relative: 8 %
Neutro Abs: 3.2 K/uL (ref 1.7–7.7)
Neutrophils Relative %: 57 %
Platelet Count: 212 K/uL (ref 150–400)
RBC: 4.66 MIL/uL (ref 4.22–5.81)
RDW: 13.2 % (ref 11.5–15.5)
WBC Count: 5.5 K/uL (ref 4.0–10.5)
nRBC: 0 % (ref 0.0–0.2)

## 2023-12-13 LAB — CMP (CANCER CENTER ONLY)
ALT: 12 U/L (ref 0–44)
AST: 14 U/L — ABNORMAL LOW (ref 15–41)
Albumin: 4.2 g/dL (ref 3.5–5.0)
Alkaline Phosphatase: 108 U/L (ref 38–126)
Anion gap: 6 (ref 5–15)
BUN: 10 mg/dL (ref 6–20)
CO2: 26 mmol/L (ref 22–32)
Calcium: 8.7 mg/dL — ABNORMAL LOW (ref 8.9–10.3)
Chloride: 109 mmol/L (ref 98–111)
Creatinine: 1.35 mg/dL — ABNORMAL HIGH (ref 0.61–1.24)
GFR, Estimated: >60 mL/min (ref >60)
Glucose, Bld: 136 mg/dL — ABNORMAL HIGH (ref 70–99)
Potassium: 4.2 mmol/L (ref 3.5–5.1)
Sodium: 141 mmol/L (ref 135–145)
Total Bilirubin: 0.4 mg/dL (ref 0.0–1.2)
Total Protein: 7.1 g/dL (ref 6.5–8.1)
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
## 2023-12-13 NOTE — Progress Notes (Signed)
Remote ICD Transmission.

## 2023-12-16 ENCOUNTER — Telehealth: Payer: Self-pay | Admitting: Hematology and Oncology

## 2023-12-16 NOTE — Telephone Encounter (Signed)
 Scheduled patient appointments. Called and left a voicemail with appointment details.

## 2023-12-20 ENCOUNTER — Other Ambulatory Visit (HOSPITAL_COMMUNITY): Payer: Self-pay | Admitting: Cardiology

## 2023-12-20 DIAGNOSIS — I5022 Chronic systolic (congestive) heart failure: Secondary | ICD-10-CM

## 2023-12-28 ENCOUNTER — Emergency Department (HOSPITAL_COMMUNITY): Payer: MEDICAID

## 2023-12-28 ENCOUNTER — Encounter (HOSPITAL_COMMUNITY): Payer: Self-pay

## 2023-12-28 ENCOUNTER — Other Ambulatory Visit: Payer: Self-pay

## 2023-12-28 ENCOUNTER — Emergency Department (HOSPITAL_COMMUNITY)
Admission: EM | Admit: 2023-12-28 | Discharge: 2023-12-29 | Disposition: A | Payer: MEDICAID | Attending: Emergency Medicine | Admitting: Emergency Medicine

## 2023-12-28 DIAGNOSIS — Z86718 Personal history of other venous thrombosis and embolism: Secondary | ICD-10-CM | POA: Diagnosis not present

## 2023-12-28 DIAGNOSIS — R55 Syncope and collapse: Secondary | ICD-10-CM | POA: Insufficient documentation

## 2023-12-28 DIAGNOSIS — Y92410 Unspecified street and highway as the place of occurrence of the external cause: Secondary | ICD-10-CM | POA: Diagnosis not present

## 2023-12-28 DIAGNOSIS — M546 Pain in thoracic spine: Secondary | ICD-10-CM | POA: Insufficient documentation

## 2023-12-28 DIAGNOSIS — Z79899 Other long term (current) drug therapy: Secondary | ICD-10-CM | POA: Diagnosis not present

## 2023-12-28 DIAGNOSIS — I110 Hypertensive heart disease with heart failure: Secondary | ICD-10-CM | POA: Insufficient documentation

## 2023-12-28 DIAGNOSIS — Z7901 Long term (current) use of anticoagulants: Secondary | ICD-10-CM | POA: Insufficient documentation

## 2023-12-28 DIAGNOSIS — R079 Chest pain, unspecified: Secondary | ICD-10-CM | POA: Diagnosis present

## 2023-12-28 DIAGNOSIS — I5022 Chronic systolic (congestive) heart failure: Secondary | ICD-10-CM | POA: Diagnosis not present

## 2023-12-28 DIAGNOSIS — M542 Cervicalgia: Secondary | ICD-10-CM | POA: Diagnosis not present

## 2023-12-28 LAB — I-STAT CHEM 8, ED
BUN: 12 mg/dL (ref 6–20)
Calcium, Ion: 1.13 mmol/L — ABNORMAL LOW (ref 1.15–1.40)
Chloride: 105 mmol/L (ref 98–111)
Creatinine, Ser: 1.4 mg/dL — ABNORMAL HIGH (ref 0.61–1.24)
Glucose, Bld: 154 mg/dL — ABNORMAL HIGH (ref 70–99)
HCT: 40 % (ref 39.0–52.0)
Hemoglobin: 13.6 g/dL (ref 13.0–17.0)
Potassium: 3.7 mmol/L (ref 3.5–5.1)
Sodium: 143 mmol/L (ref 135–145)
TCO2: 24 mmol/L (ref 22–32)

## 2023-12-28 LAB — CBC
HCT: 39.7 % (ref 39.0–52.0)
Hemoglobin: 13 g/dL (ref 13.0–17.0)
MCH: 28 pg (ref 26.0–34.0)
MCHC: 32.7 g/dL (ref 30.0–36.0)
MCV: 85.4 fL (ref 80.0–100.0)
Platelets: 204 K/uL (ref 150–400)
RBC: 4.65 MIL/uL (ref 4.22–5.81)
RDW: 14.2 % (ref 11.5–15.5)
WBC: 7.3 K/uL (ref 4.0–10.5)
nRBC: 0 % (ref 0.0–0.2)

## 2023-12-28 LAB — COMPREHENSIVE METABOLIC PANEL WITH GFR
ALT: 14 U/L (ref 0–44)
AST: 19 U/L (ref 15–41)
Albumin: 3.8 g/dL (ref 3.5–5.0)
Alkaline Phosphatase: 92 U/L (ref 38–126)
Anion gap: 14 (ref 5–15)
BUN: 11 mg/dL (ref 6–20)
CO2: 22 mmol/L (ref 22–32)
Calcium: 8.8 mg/dL — ABNORMAL LOW (ref 8.9–10.3)
Chloride: 104 mmol/L (ref 98–111)
Creatinine, Ser: 1.42 mg/dL — ABNORMAL HIGH (ref 0.61–1.24)
GFR, Estimated: 57 mL/min — ABNORMAL LOW (ref >60)
Glucose, Bld: 155 mg/dL — ABNORMAL HIGH (ref 70–99)
Potassium: 3.7 mmol/L (ref 3.5–5.1)
Sodium: 140 mmol/L (ref 135–145)
Total Bilirubin: 0.5 mg/dL (ref 0.0–1.2)
Total Protein: 7 g/dL (ref 6.5–8.1)

## 2023-12-28 LAB — SAMPLE TO BLOOD BANK

## 2023-12-28 LAB — I-STAT CG4 LACTIC ACID, ED: Lactic Acid, Venous: 2.3 mmol/L (ref 0.5–1.9)

## 2023-12-28 LAB — PROTIME-INR
INR: 1.3 — ABNORMAL HIGH (ref 0.8–1.2)
Prothrombin Time: 16.5 s — ABNORMAL HIGH (ref 11.4–15.2)

## 2023-12-28 LAB — ETHANOL: Alcohol, Ethyl (B): <15 mg/dL (ref <15)

## 2023-12-28 MED ORDER — IOHEXOL 350 MG/ML SOLN
75.0000 mL | Freq: Once | INTRAVENOUS | Status: AC | PRN
Start: 2023-12-28 — End: 2023-12-28
  Administered 2023-12-28: 75 mL via INTRAVENOUS

## 2023-12-28 MED ORDER — ACETAMINOPHEN 500 MG PO TABS
1000.0000 mg | ORAL_TABLET | Freq: Once | ORAL | Status: AC
Start: 2023-12-28 — End: 2023-12-28
  Administered 2023-12-28: 1000 mg via ORAL
  Filled 2023-12-28: qty 2
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
## 2023-12-28 NOTE — Progress Notes (Signed)
   12/28/23 2201  Spiritual Encounters  Type of Visit Attempt (pt unavailable)  Advance Directives (For Healthcare)  Does Patient Have a Medical Advance Directive? No  Mental Health Advance Directives  Does Patient Have a Mental Health Advance Directive? No   Chaplain was paged to level 2. The patient was unavailable. No family present at the time. Chaplain remains available if needed.   M.Kubra Susanna Kerry Resident [phone]
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
## 2023-12-28 NOTE — ED Notes (Signed)
Patient transported to CT with TRN on monitor

## 2023-12-30 ENCOUNTER — Ambulatory Visit (HOSPITAL_COMMUNITY)
Admission: EM | Admit: 2023-12-30 | Discharge: 2023-12-30 | Disposition: A | Discharge status: Home / Self Care | Payer: MEDICAID

## 2023-12-30 ENCOUNTER — Encounter (HOSPITAL_COMMUNITY): Payer: Self-pay

## 2023-12-30 DIAGNOSIS — S161XXA Strain of muscle, fascia and tendon at neck level, initial encounter: Secondary | ICD-10-CM

## 2023-12-30 DIAGNOSIS — S39012A Strain of muscle, fascia and tendon of lower back, initial encounter: Secondary | ICD-10-CM

## 2023-12-30 MED ORDER — METHOCARBAMOL 500 MG PO TABS: 500.0000 mg | ORAL_TABLET | Freq: Four times a day (QID) | ORAL | 0 refills | Status: AC | PRN
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
## 2023-12-30 NOTE — Discharge Instructions (Signed)
 Today you have been diagnosed with a musculoskeletal injury.  Adults may use 400 mg of ibuprofen  and 1000 mg of Tylenol  together every 8 hours as needed for pain control.  Children may take ibuprofen  and Tylenol  as directed on medication packaging.  You should use ice on affected area for 20 minutes at a time a couple times a day for the first 24 hours then you may switch to heat in the same intervals.  Be sure to put a barrier between ice or heat source and skin to prevent burns.  May also wrap affected area and Ace bandage if tolerated and appropriate, and elevate above the level of the heart to help reduce swelling.  Do not wrap Ace bandages around neck or torso as wrapping too tight can restrict air movement inability to breathe.  If symptoms do not seem to be improving in 3 to 5 days after following these instructions we need to follow-up with orthopedist or PCP.

## 2024-01-03 ENCOUNTER — Encounter: Payer: Self-pay | Admitting: Oncology

## 2024-01-20 ENCOUNTER — Encounter: Payer: Self-pay | Admitting: Radiology

## 2024-01-23 ENCOUNTER — Other Ambulatory Visit (HOSPITAL_COMMUNITY): Payer: Self-pay | Admitting: Cardiology

## 2024-01-23 DIAGNOSIS — I5022 Chronic systolic (congestive) heart failure: Secondary | ICD-10-CM

## 2024-01-27 ENCOUNTER — Ambulatory Visit (INDEPENDENT_AMBULATORY_CARE_PROVIDER_SITE_OTHER): Payer: MEDICAID

## 2024-01-27 DIAGNOSIS — I5022 Chronic systolic (congestive) heart failure: Secondary | ICD-10-CM | POA: Diagnosis not present

## 2024-01-30 LAB — CUP PACEART REMOTE DEVICE CHECK
Battery Remaining Percentage: 90 %
Date Time Interrogation Session: 20251112115600
HighPow Impedance: 70 Ohm
Implantable Lead Connection Status: 753985
Implantable Lead Implant Date: 20241213
Implantable Lead Location: 753862
Implantable Lead Model: 3501
Implantable Lead Serial Number: 238967
Implantable Pulse Generator Implant Date: 20241213
Pulse Gen Serial Number: 316377

## 2024-01-31 NOTE — Progress Notes (Signed)
 Remote ICD Transmission

## 2024-02-19 ENCOUNTER — Other Ambulatory Visit (HOSPITAL_COMMUNITY): Payer: Self-pay | Admitting: Cardiology

## 2024-02-26 ENCOUNTER — Emergency Department (HOSPITAL_BASED_OUTPATIENT_CLINIC_OR_DEPARTMENT_OTHER)
Admission: EM | Admit: 2024-02-26 | Discharge: 2024-02-26 | Disposition: A | Discharge status: Home / Self Care | Payer: MEDICAID | Attending: Emergency Medicine | Admitting: Emergency Medicine

## 2024-02-26 ENCOUNTER — Encounter (HOSPITAL_BASED_OUTPATIENT_CLINIC_OR_DEPARTMENT_OTHER): Payer: Self-pay | Admitting: Emergency Medicine

## 2024-02-26 ENCOUNTER — Other Ambulatory Visit: Payer: Self-pay

## 2024-02-26 DIAGNOSIS — Z7901 Long term (current) use of anticoagulants: Secondary | ICD-10-CM | POA: Insufficient documentation

## 2024-02-26 DIAGNOSIS — M5442 Lumbago with sciatica, left side: Secondary | ICD-10-CM | POA: Insufficient documentation

## 2024-02-26 DIAGNOSIS — M5441 Lumbago with sciatica, right side: Secondary | ICD-10-CM | POA: Insufficient documentation

## 2024-02-26 LAB — URINALYSIS, ROUTINE W REFLEX MICROSCOPIC
Bacteria, UA: NONE SEEN
Bilirubin Urine: NEGATIVE
Glucose, UA: 100 mg/dL — AB
Hgb urine dipstick: NEGATIVE
Ketones, ur: NEGATIVE mg/dL
Leukocytes,Ua: NEGATIVE
Nitrite: NEGATIVE
Protein, ur: 30 mg/dL — AB
Specific Gravity, Urine: 1.022 (ref 1.005–1.030)
pH: 5.5 (ref 5.0–8.0)

## 2024-02-26 MED ORDER — PREDNISONE 50 MG PO TABS: 50.0000 mg | ORAL_TABLET | Freq: Every day | ORAL | 0 refills | Status: DC

## 2024-02-26 MED ORDER — KETOROLAC TROMETHAMINE 60 MG/2ML IM SOLN
30.0000 mg | Freq: Once | INTRAMUSCULAR | Status: AC
  Administered 2024-02-26: 30 mg via INTRAMUSCULAR
  Filled 2024-02-26: qty 2

## 2024-02-26 MED ORDER — OXYCODONE-ACETAMINOPHEN 5-325 MG PO TABS: 1.0000 | ORAL_TABLET | ORAL | 0 refills | Status: DC | PRN

## 2024-02-26 MED ORDER — METHYLPREDNISOLONE SODIUM SUCC 125 MG IJ SOLR
125.0000 mg | Freq: Once | INTRAMUSCULAR | Status: AC
  Administered 2024-02-26: 125 mg via INTRAMUSCULAR
  Filled 2024-02-26: qty 2

## 2024-02-26 NOTE — ED Triage Notes (Signed)
 Pt endorses bilateral lower back pain and lower abd pain starting last night. Denies injury, dysuria or fever

## 2024-02-26 NOTE — Discharge Instructions (Addendum)
 Return if any problems.

## 2024-02-26 NOTE — ED Provider Notes (Signed)
 [place] EMERGENCY DEPARTMENT AT [facility] Provider Note   CSN: [id number] Arrival date & time: 02/26/24  1134     Patient presents with: Back Pain   Justin [name] is a 58 y.o. male.   Pt complains of pain in his low back.  Patient reports he has a past medical history of sciatica on his right side.  Patient has had multiple spinal surgeries.  Patient reports he has been followed by Dr. Colon.  Patient reports his last surgery was in 2024 to remove hardware that was causing him pain.  Patient states that he now has discomfort not just on his right side but in his left as well.  Patient denies any new injuries.  Patient has not had any loss of control of bowel or bladder.  He denies any numbness.  Patient states that he has been on multiple medications in the past he has taken prednisone .  Patient reports that he has experienced some relief with prednisone  in the past.  Patient states that the pain was so bad he could not sleep last night.  He complains of feeling fatigued today.  The history is provided by the patient. No language interpreter was used.  Back Pain      Prior to Admission medications   Medication Sig Start Date End Date Taking? Authorizing Provider  oxyCODONE -acetaminophen  (PERCOCET) 5-325 MG tablet Take 1 tablet by mouth every 4 (four) hours as needed for up to 5 days for severe pain (pain score 7-10). 02/26/24 03/02/24 Yes Flint Sonny POUR, PA-C  PRADAXA  150 MG CAPS capsule Take 150 mg by mouth 2 (two) times daily. 01/23/24  Yes [provider]  predniSONE  (DELTASONE ) 50 MG tablet Take 1 tablet (50 mg total) by mouth daily. 02/26/24  Yes Kielan Dreisbach K, PA-C  spironolactone  (ALDACTONE ) 25 MG tablet Take 25 mg by mouth daily. 01/23/24  Yes [provider]  tiZANidine  (ZANAFLEX ) 4 MG tablet Take 4 mg by mouth 2 (two) times daily as needed. 01/23/24  Yes [provider]  carvedilol  (COREG ) 12.5 MG tablet TAKE 1 & 1/2 TABLETS (18.75 MG  TOTAL) BY MOUTH 2 (TWO) TIMES DAILY (AM+PM) 11/21/23   Rolan Ezra RAMAN, MD  doxepin  (SINEQUAN ) 75 MG capsule Take 75 mg by mouth at bedtime. 07/24/22   [provider]  empagliflozin  (JARDIANCE ) 10 MG TABS tablet Take 1 tablet (10 mg total) by mouth daily. PLEASE SCHEDULE APPOINTMENT FOR MORE REFILLS [phone] OPTION 2 02/19/24   Rolan Ezra RAMAN, MD  ENTRESTO  97-103 MG Take 1 tablet by mouth 2 (two) times daily. PLEASE SCHEDULE APPOINTMENT FOR MORE REFILLS [phone] OPTION 2 02/19/24   Rolan Ezra RAMAN, MD  hydrOXYzine  (ATARAX ) 25 MG tablet Take 25 mg by mouth 3 (three) times daily as needed for anxiety. 11/21/22   [provider]  methocarbamol  (ROBAXIN ) 500 MG tablet Take 1 tablet (500 mg total) by mouth every 6 (six) hours as needed for muscle spasms. 12/30/23   Andra Corean JAYSON, PA-C  mirtazapine  (REMERON ) 45 MG tablet Take 45 mg by mouth at bedtime.    [provider]  pravastatin  (PRAVACHOL ) 10 MG tablet Take 1 tablet (10 mg total) by mouth at bedtime. 04/02/23   Rolan Ezra RAMAN, MD  QUEtiapine  (SEROQUEL  XR) 400 MG 24 hr tablet Take 800 mg by mouth at bedtime. 11/16/21   [provider]  rivaroxaban  (XARELTO ) 20 MG TABS tablet Take 1 tablet (20 mg total) by mouth daily with supper. 12/01/23   Smoot,  Lauraine LABOR, PA-C    Allergies: Lyrica [pregabalin]    Review of Systems  Musculoskeletal:  Positive for back pain.  All other systems reviewed and are negative.   Updated Vital Signs BP 129/82 (BP Location: Left Arm)   Pulse 76   Temp 97.9 F (36.6 C) (Oral)   Resp 17   Wt 104.3 kg   SpO2 93%   BMI 33.97 kg/m   Physical Exam Vitals and nursing note reviewed.  Constitutional:      Appearance: He is well-developed.  HENT:     Head: Normocephalic.     Nose: Nose normal.     Mouth/Throat:     Mouth: Mucous membranes are moist.  Eyes:     Pupils: Pupils are equal, round, and reactive to light.  Cardiovascular:     Rate and Rhythm: Normal rate.   Pulmonary:     Effort: Pulmonary effort is normal.  Abdominal:     General: There is no distension.  Musculoskeletal:        General: Normal range of motion.     Cervical back: Normal range of motion.  Skin:    General: Skin is warm.  Neurological:     General: No focal deficit present.     Mental Status: He is alert and oriented to person, place, and time.  Psychiatric:        Mood and Affect: Mood normal.     (all labs ordered are listed, but only abnormal results are displayed) Labs Reviewed  URINALYSIS, ROUTINE W REFLEX MICROSCOPIC - Abnormal; Notable for the following components:      Result Value   Glucose, UA 100 (*)    Protein, ur 30 (*)    All other components within normal limits    EKG: None  Radiology: No results found.   Procedures   Medications Ordered in the ED  ketorolac  (TORADOL ) injection 30 mg (30 mg Intramuscular Given 02/26/24 1432)  methylPREDNISolone  sodium succinate (SOLU-MEDROL ) 125 mg/2 mL injection 125 mg (125 mg Intramuscular Given 02/26/24 1432)                                    Medical Decision Making Patient complains of low back pain with radiation down both legs.  Patient reports he has a history of sciatica on the right side but he has not had this on the left in the past  Amount and/or Complexity of Data Reviewed Labs: ordered.    Details: UA ordered UA shows no acute findings  Risk Prescription drug management. Risk Details: Patient given an injection of Solu-Medrol  and Toradol .  Patient reevaluated and he is sleeping soundly.  Patient aroused and reports that he feels much better.  Patient is counseled on the need to follow-up with his primary care physician and with neurosurgery.  Patient is given a prescription for Percocet and prednisone .  He is discharged in stable condition        Final diagnoses:  Acute bilateral low back pain with bilateral sciatica    ED Discharge Orders          Ordered     oxyCODONE -acetaminophen  (PERCOCET) 5-325 MG tablet  Every 4 hours PRN        02/26/24 1558    predniSONE  (DELTASONE ) 50 MG tablet  Daily        02/26/24 1558           An After  Visit Summary was printed and given to the patient.     Flint Sonny POUR, PA-C 02/26/24 1816    Tonia Chew, MD 03/02/24 [phone]

## 2024-03-02 NOTE — H&P (Signed)
°  Patient: Justin [name]  PID: [id number]  DOB: 1965/06/28  SEX: Male   Patient referred byDDS for extraction remaining  teeth # 22, 23, 24, 25, 26, 27.   CC: Pain off and on for years.  Past Medical History:  DVT 12/01/2023, Chronic anticoagulation, DVT 12/01/2023, Chronic anticoagulation,     Medications: Remeron , Seroquel , Coreg , Pradaxa , Sinequan , Jardiance , Norco, Hydroxyzine , Xarelto , Entresto     Allergies:     NKDA    Surgeries:   SubQ ICD implant, TEE, Cardiac Angiography,      Social History       Smoking:            Alcohol: Drug use:                             Exam: BMI 35. Edentulous maxilla. Mandibular remaining teeth # 22, 23, 24, 25, 26, and 27 with decay.   No purulence, edema, fluctuance, trismus. Oral cancer screening negative. Pharynx clear. No lymphadenopathy.  Panorex:Edentulous maxilla. Mandibular remaining teeth # 22, 23, 24, 25, 26, and 27 with decay.   Assessment: ASA 3. Non-restorable teeth # 22, 23, 24, 25, 26, and 27.              Plan: 1. MD Clearance 2.Extraction Teeth # 22, 23, 24, 25, 26, and 27.  Alveoloplasty.  Hospital Day surgery.                 Rx: n               Risks and complications explained. Questions answered.   Glendia EMERSON Primrose, DMD

## 2024-03-05 ENCOUNTER — Encounter (HOSPITAL_COMMUNITY): Payer: Self-pay | Admitting: Oral Surgery

## 2024-03-05 ENCOUNTER — Encounter: Payer: Self-pay | Admitting: Cardiology

## 2024-03-05 ENCOUNTER — Other Ambulatory Visit: Payer: Self-pay

## 2024-03-05 NOTE — Progress Notes (Signed)
 PERIOPERATIVE PRESCRIPTION FOR IMPLANTED CARDIAC DEVICE PROGRAMMING  Patient Information: Name:  Justin [name]  DOB:  July 09, 1965  MRN:  [id number]   Planned Procedure:  Extraction teeth number twenty two, twenty three, twenty four, twenty five, twenty six and twenty seven. Alveoloplasty  Surgeon:  Dr. Glendia Primrose  Date of Procedure:  12//19/20205  Cautery will be used.  Position during surgery:  Supine   Device Information:  Clinic EP Physician: Dr. Ole Holts  Device Type:  Defibrillator Manufacturer and Phone #:  [employer]: [phone] Pacemaker Dependent?:  No. Date of Last Device Check:  01/29/2024  Normal Device Function?:  Yes.    Electrophysiologist's Recommendations:  Have magnet available. Provide continuous ECG monitoring when magnet is used or reprogramming is to be performed.  Procedure may interfere with device function.  Magnet should be placed over device during procedure.  Per Device Clinic Standing Orders, Rozelle JONELLE Banter, [place]  3:37 PM 03/05/2024

## 2024-03-05 NOTE — Progress Notes (Signed)
 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  He was seen in the ED on 12/01/2023 and noted to have acute small DVT in the mid calf area.  At that time he was switched from Pradaxa  to Xarelto .  He followed up with hematologist Dr. Autumn on 12/13/2023 and was noted to be doing well on Xarelto .  Recommended continue 20 mg once daily.  Dr. Autumn cleared patient to proceed with surgery in letter dated 01/15/2024 stating, Please wait until first week of December 2025 and then hold Xarelto  for 3 days before procedure.  Resume Xarelto  on the day after procedure or when deemed safe.  Patient reports last dose Xarelto  03/02/2024.  Last seen in EP cardiology follow-up by APP Jodie Passey on 06/11/2023.  At that time noted to be euvolemic, stable on appropriate medical regimen, normal ICD function.  No changes to management.  58-month follow-up recommended.  Patient will need day of surgery labs and evaluation.  EKG 12/28/2023: Sinus rhythm.  Rate 84.  Borderline prolonged PR interval.  Borderline left axis deviation.  Borderline T abnormalities, lateral leads.  No significant change.  Cardiac MR 01/30/2023: IMPRESSION: 1.  Mildly dilated left ventricle with global hypokinesis, EF 30%.   2.  Normal RV size with mild hypokinesis, EF 45%.   3. There was a subtle mid-wall stripe of LGE in the basal-mid septum. This is nonspecific and can be seen in dilated cardiomyopathies.   4. Mildly  elevated extracellular volume percentage at 33%, suggesting elevated myocardial fibrotic content.  TTE 12/11/2022: 1. Left ventricular ejection fraction, by estimation, is 30 to 35%. The  left ventricle has moderately decreased function. The left ventricle  demonstrates global hypokinesis. The left ventricular internal cavity size  was mildly dilated. Left ventricular  diastolic parameters are consistent with Grade I diastolic dysfunction  (impaired relaxation).   2. Right ventricular systolic function is normal. The right ventricular  size is normal. Tricuspid regurgitation signal is inadequate for assessing  PA pressure.   3. Left atrial size was mildly dilated.   4. The mitral valve is normal in structure. No evidence of mitral valve  regurgitation. No evidence of mitral stenosis.   5. The aortic valve is tricuspid. Aortic valve regurgitation is not  visualized. No aortic stenosis is present.   6. The inferior vena cava is normal in size with greater than 50%  respiratory variability, suggesting right atrial pressure of 3 mmHg.    R/LHC 05/18/2022:   Mid LAD lesion is 20% stenosed.   1. Mild nonobstructive CAD.  2. Normal PCWP and RA pressure, mildly elevated LVEDP.  3. Preserved cardiac output.     Well-compensated nonischemic cardiomyopathy.  Will need cardiac MRI when battery is changed in his spinal stimulator to make it MRI compatible.      Justin [name] [facility] Short Stay Center/Anesthesiology Phone [phone] 03/05/2024 3:59 PM

## 2024-03-05 NOTE — Anesthesia Preprocedure Evaluation (Signed)
 Anesthesia Evaluation    Airway        Dental   Pulmonary           Cardiovascular hypertension,      Neuro/Psych    GI/Hepatic   Endo/Other    Renal/GU      Musculoskeletal   Abdominal   Peds  Hematology   Anesthesia Other Findings   Reproductive/Obstetrics                              Anesthesia Physical Anesthesia Plan  ASA:   Anesthesia Plan:    Post-op Pain Management:    Induction:   PONV Risk Score and Plan:   Airway Management Planned:   Additional Equipment:   Intra-op Plan:   Post-operative Plan:   Informed Consent:   Plan Discussed with:   Anesthesia Plan Comments: (PAT note by Lynwood Hope, PA-C:  58 year old male pertinent history including HFrEF (EF 30% on MRI 01/2023) s/p [employer] S-ICD, mild nonobstructive CAD by cath 05/2022, 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  He was seen in the ED on 12/01/2023 and noted to have acute small DVT in the mid calf area.  At that time he was switched from Pradaxa  to Xarelto .  He followed up with hematologist Dr. Autumn on 12/13/2023 and was noted to be doing well on Xarelto .  Recommended continue 20 mg once daily.  Dr. Autumn cleared patient to proceed with surgery in letter dated 01/15/2024 stating, Please wait until first week of December 2025 and then hold Xarelto  for 3 days before procedure.  Resume Xarelto  on the day after procedure or when deemed safe.  Patient reports last dose Xarelto  03/02/2024.  Last seen in EP cardiology follow-up by APP Jodie Passey on 06/11/2023.  At  that time noted to be euvolemic, stable on appropriate medical regimen, normal ICD function.  No changes to management.  110-month follow-up recommended.  Patient will need day of surgery labs and evaluation.  EKG 12/28/2023: Sinus rhythm.  Rate 84.  Borderline prolonged PR interval.  Borderline left axis deviation.  Borderline T abnormalities, lateral leads.  No significant change.  Cardiac MR 01/30/2023: IMPRESSION: 1.  Mildly dilated left ventricle with global hypokinesis, EF 30%.   2.  Normal RV size with mild hypokinesis, EF 45%.   3. There was a subtle mid-wall stripe of LGE in the basal-mid septum. This is nonspecific and can be seen in dilated cardiomyopathies.   4. Mildly elevated extracellular volume percentage at 33%, suggesting elevated myocardial fibrotic content.  TTE 12/11/2022: 1. Left ventricular ejection fraction, by estimation, is 30 to 35%. The  left ventricle has moderately decreased function. The left ventricle  demonstrates global hypokinesis. The left ventricular internal cavity size  was mildly dilated. Left ventricular  diastolic parameters are consistent with Grade I diastolic dysfunction  (impaired relaxation).   2. Right ventricular systolic function is normal. The right ventricular  size is normal. Tricuspid regurgitation signal is inadequate for assessing  PA pressure.   3. Left atrial size was mildly dilated.   4. The mitral valve is normal in structure. No evidence of mitral valve  regurgitation. No evidence of  mitral stenosis.   5. The aortic valve is tricuspid. Aortic valve regurgitation is not  visualized. No aortic stenosis is present.   6. The inferior vena cava is normal in size with greater than 50%  respiratory variability, suggesting right atrial pressure of 3 mmHg.    R/LHC 05/18/2022:   Mid LAD lesion is 20% stenosed.   1. Mild nonobstructive CAD.  2. Normal PCWP and RA pressure, mildly elevated LVEDP.  3. Preserved cardiac output.      Well-compensated nonischemic cardiomyopathy.  Will need cardiac MRI when battery is changed in his spinal stimulator to make it MRI compatible.    )         Anesthesia Quick Evaluation

## 2024-03-05 NOTE — Progress Notes (Signed)
 SDW call  Patient was given pre-op instructions over the phone. Patient verbalized understanding of instructions provided. Denies any SOB, fever or cough     PCP - [facility] Cardiologist - Dr. Stanly Leavens EP Cardiologist: Dr. Ole Holts Pulmonary:    PPM/ICD - ICD, [facility] Scientific Device Orders: requested Rep Notified - Yes. Joey   Has Spinal Cord Stimulator: instructed to bring remote  Chest x-ray - 12/28/2023 EKG -  12/30/2023 Stress Test - ECHO - 12/11/2022 Cardiac Cath - 05/18/2022  Sleep Study/sleep apnea/CPAP:  OSA with nightly CPAP  Non-diabetic  Jardiance , hold DOS  Blood Thinner Instructions: Xarelto , states last dose 03/02/2024 Aspirin  Instructions:denies   ERAS Protcol - NPO   Anesthesia review: Yes. OSA, CHF, HTN, Hx DVT, hx subdural hematoma, long term xarelto   Your procedure is scheduled on Friday March 06, 2024  Report to [facility] Main Entrance A at  0750  A.M., then check in with the Admitting office.  Call this number if you have problems the morning of surgery:  [phone]   If you have any questions prior to your surgery date call [phone]: Open Monday-Friday 8am-4pm If you experience any cold or flu symptoms such as cough, fever, chills, shortness of breath, etc. between now and your scheduled surgery, please notify us  at the above number    Remember:  Do not eat or drink after midnight the night before your surgery  Take these medicines the morning of surgery with A SIP OF WATER:  carvedilol   As needed: robaxin   As of today, STOP taking any Aspirin  (unless otherwise instructed by your surgeon) Aleve , Naproxen , Ibuprofen , Motrin , Advil , Goody's, BC's, all herbal medications, fish oil, and all vitamins.

## 2024-03-06 ENCOUNTER — Ambulatory Visit (HOSPITAL_BASED_OUTPATIENT_CLINIC_OR_DEPARTMENT_OTHER): Payer: MEDICAID | Admitting: Physician Assistant

## 2024-03-06 ENCOUNTER — Encounter (HOSPITAL_COMMUNITY): Admission: RE | Disposition: A | Payer: Self-pay | Source: Home / Self Care | Attending: Oral Surgery

## 2024-03-06 ENCOUNTER — Encounter (HOSPITAL_COMMUNITY): Payer: Self-pay | Admitting: Oral Surgery

## 2024-03-06 ENCOUNTER — Encounter (HOSPITAL_COMMUNITY): Payer: Self-pay | Admitting: Physician Assistant

## 2024-03-06 ENCOUNTER — Ambulatory Visit (HOSPITAL_COMMUNITY)
Admission: RE | Admit: 2024-03-06 | Discharge: 2024-03-06 | Disposition: A | Discharge status: Home / Self Care | Payer: MEDICAID | Attending: Oral Surgery | Admitting: Oral Surgery

## 2024-03-06 DIAGNOSIS — Z95810 Presence of automatic (implantable) cardiac defibrillator: Secondary | ICD-10-CM | POA: Insufficient documentation

## 2024-03-06 DIAGNOSIS — G4730 Sleep apnea, unspecified: Secondary | ICD-10-CM | POA: Insufficient documentation

## 2024-03-06 DIAGNOSIS — K029 Dental caries, unspecified: Secondary | ICD-10-CM | POA: Diagnosis present

## 2024-03-06 DIAGNOSIS — I5020 Unspecified systolic (congestive) heart failure: Secondary | ICD-10-CM

## 2024-03-06 DIAGNOSIS — K0889 Other specified disorders of teeth and supporting structures: Secondary | ICD-10-CM

## 2024-03-06 DIAGNOSIS — Z86711 Personal history of pulmonary embolism: Secondary | ICD-10-CM | POA: Insufficient documentation

## 2024-03-06 DIAGNOSIS — Z86718 Personal history of other venous thrombosis and embolism: Secondary | ICD-10-CM | POA: Insufficient documentation

## 2024-03-06 DIAGNOSIS — Z7901 Long term (current) use of anticoagulants: Secondary | ICD-10-CM | POA: Diagnosis not present

## 2024-03-06 DIAGNOSIS — I509 Heart failure, unspecified: Secondary | ICD-10-CM | POA: Diagnosis not present

## 2024-03-06 DIAGNOSIS — I110 Hypertensive heart disease with heart failure: Secondary | ICD-10-CM | POA: Insufficient documentation

## 2024-03-06 HISTORY — PX: TOOTH EXTRACTION: SHX859

## 2024-03-06 LAB — BASIC METABOLIC PANEL WITH GFR
Anion gap: 9 (ref 5–15)
BUN: 9 mg/dL (ref 6–20)
CO2: 24 mmol/L (ref 22–32)
Calcium: 8.7 mg/dL — ABNORMAL LOW (ref 8.9–10.3)
Chloride: 106 mmol/L (ref 98–111)
Creatinine, Ser: 1.31 mg/dL — ABNORMAL HIGH (ref 0.61–1.24)
GFR, Estimated: >60 mL/min
Glucose, Bld: 125 mg/dL — ABNORMAL HIGH (ref 70–99)
Potassium: 4.1 mmol/L (ref 3.5–5.1)
Sodium: 139 mmol/L (ref 135–145)

## 2024-03-06 LAB — CBC
HCT: 42.1 % (ref 39.0–52.0)
Hemoglobin: 14 g/dL (ref 13.0–17.0)
MCH: 28.7 pg (ref 26.0–34.0)
MCHC: 33.3 g/dL (ref 30.0–36.0)
MCV: 86.4 fL (ref 80.0–100.0)
Platelets: 224 K/uL (ref 150–400)
RBC: 4.87 MIL/uL (ref 4.22–5.81)
RDW: 14.5 % (ref 11.5–15.5)
WBC: 8.3 K/uL (ref 4.0–10.5)
nRBC: 0 % (ref 0.0–0.2)

## 2024-03-06 SURGERY — DENTAL RESTORATION/EXTRACTIONS
Anesthesia: General | Site: Mouth

## 2024-03-06 MED ORDER — PHENYLEPHRINE 80 MCG/ML (10ML) SYRINGE FOR IV PUSH (FOR BLOOD PRESSURE SUPPORT)
PREFILLED_SYRINGE | INTRAVENOUS | Status: DC | PRN
  Administered 2024-03-06: 160 ug via INTRAVENOUS
  Administered 2024-03-06: 80 ug via INTRAVENOUS

## 2024-03-06 MED ORDER — SUGAMMADEX SODIUM 200 MG/2ML IV SOLN
INTRAVENOUS | Status: AC
  Filled 2024-03-06: qty 2

## 2024-03-06 MED ORDER — VASOPRESSIN 20 UNIT/ML IV SOLN
INTRAVENOUS | Status: AC
  Filled 2024-03-06: qty 1

## 2024-03-06 MED ORDER — CHLORHEXIDINE GLUCONATE 0.12 % MT SOLN
15.0000 mL | Freq: Once | OROMUCOSAL | Status: AC
  Administered 2024-03-06: 15 mL via OROMUCOSAL
  Filled 2024-03-06: qty 15

## 2024-03-06 MED ORDER — OXYCODONE-ACETAMINOPHEN 5-325 MG PO TABS: 1.0000 | ORAL_TABLET | ORAL | 0 refills | Status: AC | PRN

## 2024-03-06 MED ORDER — LACTATED RINGERS IV SOLN: INTRAVENOUS | Status: DC

## 2024-03-06 MED ORDER — PHENYLEPHRINE 80 MCG/ML (10ML) SYRINGE FOR IV PUSH (FOR BLOOD PRESSURE SUPPORT)
PREFILLED_SYRINGE | INTRAVENOUS | Status: AC
  Filled 2024-03-06: qty 10

## 2024-03-06 MED ORDER — FENTANYL CITRATE (PF) 250 MCG/5ML IJ SOLN
INTRAMUSCULAR | Status: DC | PRN
  Administered 2024-03-06: 50 ug via INTRAVENOUS

## 2024-03-06 MED ORDER — MIDAZOLAM HCL (PF) 2 MG/2ML IJ SOLN
INTRAMUSCULAR | Status: DC | PRN
  Administered 2024-03-06: 2 mg via INTRAVENOUS

## 2024-03-06 MED ORDER — ROCURONIUM BROMIDE 10 MG/ML (PF) SYRINGE
PREFILLED_SYRINGE | INTRAVENOUS | Status: DC | PRN
  Administered 2024-03-06: 60 mg via INTRAVENOUS

## 2024-03-06 MED ORDER — PROPOFOL 10 MG/ML IV BOLUS
INTRAVENOUS | Status: AC
  Filled 2024-03-06: qty 20

## 2024-03-06 MED ORDER — ORAL CARE MOUTH RINSE: 15.0000 mL | Freq: Once | OROMUCOSAL | Status: AC

## 2024-03-06 MED ORDER — LIDOCAINE-EPINEPHRINE 2 %-1:100000 IJ SOLN
INTRAMUSCULAR | Status: AC
  Filled 2024-03-06: qty 1

## 2024-03-06 MED ORDER — CEFAZOLIN SODIUM-DEXTROSE 2-4 GM/100ML-% IV SOLN
2.0000 g | INTRAVENOUS | Status: AC
  Administered 2024-03-06: 2 g via INTRAVENOUS
  Filled 2024-03-06: qty 100

## 2024-03-06 MED ORDER — LIDOCAINE 2% (20 MG/ML) 5 ML SYRINGE
INTRAMUSCULAR | Status: DC | PRN
  Administered 2024-03-06: 100 mg via INTRAVENOUS

## 2024-03-06 MED ORDER — PHENYLEPHRINE HCL-NACL 20-0.9 MG/250ML-% IV SOLN
INTRAVENOUS | Status: DC | PRN
  Administered 2024-03-06: 30 ug/min via INTRAVENOUS

## 2024-03-06 MED ORDER — DEXAMETHASONE SOD PHOSPHATE PF 10 MG/ML IJ SOLN
INTRAMUSCULAR | Status: DC | PRN
  Administered 2024-03-06: 10 mg via INTRAVENOUS

## 2024-03-06 MED ORDER — ONDANSETRON HCL 4 MG/2ML IJ SOLN
INTRAMUSCULAR | Status: DC | PRN
  Administered 2024-03-06: 4 mg via INTRAVENOUS

## 2024-03-06 MED ORDER — PROPOFOL 10 MG/ML IV BOLUS
INTRAVENOUS | Status: DC | PRN
  Administered 2024-03-06: 140 mg via INTRAVENOUS

## 2024-03-06 MED ORDER — ALBUMIN HUMAN 5 % IV SOLN: INTRAVENOUS | Status: DC | PRN

## 2024-03-06 MED ORDER — SUGAMMADEX SODIUM 200 MG/2ML IV SOLN
INTRAVENOUS | Status: DC | PRN
  Administered 2024-03-06: 300 mg via INTRAVENOUS

## 2024-03-06 MED ORDER — LIDOCAINE 2% (20 MG/ML) 5 ML SYRINGE
INTRAMUSCULAR | Status: AC
  Filled 2024-03-06: qty 5

## 2024-03-06 MED ORDER — LIDOCAINE-EPINEPHRINE 2 %-1:100000 IJ SOLN
INTRAMUSCULAR | Status: DC | PRN
  Administered 2024-03-06: 10 mL

## 2024-03-06 MED ORDER — ROCURONIUM BROMIDE 10 MG/ML (PF) SYRINGE
PREFILLED_SYRINGE | INTRAVENOUS | Status: AC
  Filled 2024-03-06: qty 10

## 2024-03-06 MED ORDER — SODIUM CHLORIDE 0.9 % IR SOLN
Status: DC | PRN
  Administered 2024-03-06: 1000 mL

## 2024-03-06 MED ORDER — AMOXICILLIN 500 MG PO CAPS: 500.0000 mg | ORAL_CAPSULE | Freq: Three times a day (TID) | ORAL | 0 refills | Status: AC

## 2024-03-06 MED ORDER — MIDAZOLAM HCL 2 MG/2ML IJ SOLN
INTRAMUSCULAR | Status: AC
  Filled 2024-03-06: qty 2

## 2024-03-06 MED ORDER — FENTANYL CITRATE (PF) 100 MCG/2ML IJ SOLN
INTRAMUSCULAR | Status: AC
  Filled 2024-03-06: qty 2

## 2024-03-06 MED ORDER — VASOPRESSIN 20 UNIT/ML IV SOLN
INTRAVENOUS | Status: DC | PRN
  Administered 2024-03-06: 1 [IU] via INTRAVENOUS

## 2024-03-06 MED ORDER — ONDANSETRON HCL 4 MG/2ML IJ SOLN
INTRAMUSCULAR | Status: AC
  Filled 2024-03-06: qty 2

## 2024-03-06 MED ORDER — 0.9 % SODIUM CHLORIDE (POUR BTL) OPTIME
TOPICAL | Status: DC | PRN
  Administered 2024-03-06: 1000 mL

## 2024-03-06 SURGICAL SUPPLY — 23 items
BLADE SURG 15 STRL LF DISP TIS (BLADE) ×1 IMPLANT
BUR CROSS CUT FISSURE 1.6 (BURR) ×1 IMPLANT
BUR EGG ELITE 4.0 (BURR) IMPLANT
CANISTER SUCTION 3000ML PPV (SUCTIONS) ×1 IMPLANT
COVER SURGICAL LIGHT HANDLE (MISCELLANEOUS) ×1 IMPLANT
GAUZE PACKING FOLDED 2 STR (GAUZE/BANDAGES/DRESSINGS) ×1 IMPLANT
GLOVE BIO SURGEON STRL SZ8 (GLOVE) ×1 IMPLANT
GOWN STRL REUS W/ TWL LRG LVL3 (GOWN DISPOSABLE) ×1 IMPLANT
GOWN STRL REUS W/ TWL XL LVL3 (GOWN DISPOSABLE) ×1 IMPLANT
IV 0.9% NACL 1000 ML (IV SOLUTION) ×1 IMPLANT
KIT BASIN OR (CUSTOM PROCEDURE TRAY) ×1 IMPLANT
KIT TURNOVER KIT B (KITS) ×1 IMPLANT
NDL HYPO 25GX1X1/2 BEV (NEEDLE) ×2 IMPLANT
NEEDLE HYPO 25GX1X1/2 BEV (NEEDLE) ×2 IMPLANT
PAD ARMBOARD POSITIONER FOAM (MISCELLANEOUS) ×1 IMPLANT
SLEEVE IRRIGATION ELITE 7 (MISCELLANEOUS) ×1 IMPLANT
SOLN 0.9% NACL POUR BTL 1000ML (IV SOLUTION) ×1 IMPLANT
SUT CHROMIC 4 0 RB 1X27 (SUTURE) ×1 IMPLANT
SYR BULB IRRIG 60ML STRL (SYRINGE) ×1 IMPLANT
SYR CONTROL 10ML LL (SYRINGE) ×1 IMPLANT
TRAY ENT MC OR (CUSTOM PROCEDURE TRAY) ×1 IMPLANT
TUBING IRRIGATION (MISCELLANEOUS) ×1 IMPLANT
YANKAUER SUCT BULB TIP NO VENT (SUCTIONS) ×1 IMPLANT

## 2024-03-06 NOTE — H&P (Signed)
 H&P documentation  -History and Physical Reviewed  -Patient has been re-examined  -No change in the plan of care  Justin [name]

## 2024-03-06 NOTE — Op Note (Signed)
 Justin [name], HOLZMANN MEDICAL RECORD NO: [id number] ACCOUNT NO: [id number] DATE OF BIRTH: [birth year] FACILITY: [facility] LOCATION: [facility]-PER[facility] PHYSICIAN: Glendia EMERSON Primrose, DDS  Operative Report   DATE OF PROCEDURE: 03/06/2024  PREOPERATIVE DIAGNOSIS:  Nonrestorable teeth secondary to dental caries, #22, 23, 24, 25, 26, and 27.  POSTOPERATIVE DIAGNOSES:  Nonrestorable teeth secondary to dental caries, #22, 23, 24, 25, 26, and 27 and right mandibular lingual torus.  PROCEDURE:  Extraction teeth #22, 23, 24, 25, 26, and 27.  Alveoloplasty, right and left mandible.  Reduction, right mandibular lingual torus.  SURGEON:  Glendia EMERSON Primrose, DDS.  ANESTHESIA AND ANESTHETIST:  General nasal intubation, Dr. Celine attending.  DESCRIPTION OF PROCEDURE:  The patient was taken to the operating room and placed on the table in supine position.  General anesthesia was administered.  A nasal endotracheal tube was placed and secured.  The eyes were protected.  The patient was draped  for surgery.  Timeout was performed.  The posterior pharynx was suctioned and a throat pack was placed.  A 2% lidocaine  1:100,000 epinephrine  was infiltrated in an inferior alveolar block on the right and the left sides with buccal infiltration in the  anterior mandible.  The left side was operated first.  A 15 blade was used to make an incision on the alveolar crest approximately 1 cm proximal to tooth #22 and carried forward in the buccal and lingual sulcus until tooth #27 was encountered.  The  periosteum was reflected.  The teeth were elevated with a 301 elevator and removed with a dental forceps.  The sockets were curetted.  The periosteum was further reflected to expose the alveolar crest which had irregular contour and severe undercut in  the lower portion of the buccal vestibule.  The Stryker handpiece was used with the egg bur under irrigation to reduce the contour of the superior aspect of the lower jaw alveolus to reduce the  amount of lingual undercut.  Then the area was further  smoothed using the bone file and then the area was irrigated and closed with 3-0 chromic.  Then attention was turned to the right side.  The 15 blade was used to make an incision, approximately 1 cm proximal to tooth #27 and the periosteum was reflected.   It was noted that there was a lingual torus on the right side that was somewhat prominent and decision was made to reduce this to prevent need to come back to the operating room prior to denture fabrication.  The incision was carried further proximally  to the area of the first molar and then the tissue was reflected lingually.  The torus was reduced using the egg bur in the Stryker handpiece under irrigation and then alveoloplasty was performed in the buccal aspect of the right mandible.  Then the  area was irrigated and closed with 3-0 chromic. The oral cavity was then irrigated and suctioned.  The throat pack was removed.  The patient was left in care of anesthesia for transport to recovery and discharge through day surgery.  ESTIMATED BLOOD LOSS:  Minimal.  COMPLICATIONS:  None.  SPECIMENS:  None.  COUNTS:  Correct.   CHR D: 03/06/2024 1:01:58 pm T: 03/06/2024 1:11:00 pm  JOB: [id number]/ [id number]

## 2024-03-06 NOTE — Transfer of Care (Signed)
 Immediate Anesthesia Transfer of Care Note  Patient: Justin [name]  Procedure(s) Performed: DENTAL RESTORATION/EXTRACTIONS NUMBER TWENTY TWO, TWENTY THREE, TWENTY FOUR, TWENTY FIVE, TWENTY SIX, AND TWENTY SEVEN, ALVEOLOPLASTY (Mouth)  Patient Location: PACU  Anesthesia Type:General  Level of Consciousness: awake, alert , and oriented  Airway & Oxygen Therapy: Patient Spontanous Breathing  Post-op Assessment: Report given to RN and Post -op Vital signs reviewed and stable  Post vital signs: Reviewed and stable  Last Vitals:  Vitals Value Taken Time  BP 126/90 03/06/24 13:17  Temp 36.4 C 03/06/24 13:17  Pulse 74 03/06/24 13:24  Resp 19 03/06/24 13:24  SpO2 94 % 03/06/24 13:24  Vitals shown include unfiled device data.  Last Pain:  Vitals:   03/06/24 1317  TempSrc:   PainSc: 0-No pain         Complications: No notable events documented.

## 2024-03-06 NOTE — Anesthesia Postprocedure Evaluation (Signed)
"   Anesthesia Post Note  Patient: Justin [name]  Procedure(s) Performed: DENTAL RESTORATION/EXTRACTIONS NUMBER TWENTY TWO, TWENTY THREE, TWENTY FOUR, TWENTY FIVE, TWENTY SIX, AND TWENTY SEVEN, ALVEOLOPLASTY (Mouth)     Patient location during evaluation: PACU Anesthesia Type: General Level of consciousness: awake Pain management: pain level controlled Vital Signs Assessment: post-procedure vital signs reviewed and stable Respiratory status: spontaneous breathing Cardiovascular status: blood pressure returned to baseline Postop Assessment: no apparent nausea or vomiting Anesthetic complications: no   No notable events documented.  Last Vitals:  Vitals:   03/06/24 1345 03/06/24 1400  BP: (!) 136/90 107/71  Pulse: 69 71  Resp: 15 18  Temp:    SpO2: 97% 100%    Last Pain:  Vitals:   03/06/24 1345  TempSrc:   PainSc: 0-No pain                 Taiesha Bovard T Colhoun      "

## 2024-03-06 NOTE — Op Note (Signed)
 03/06/2024  12:57 PM  PATIENT:  Justin [name]  58 y.o. male  PRE-OPERATIVE DIAGNOSIS:  DENTAL CARIES TEETH NUMBER TWENTY TWO, TWENTY THREE, TWENTY FOUR, TWENTY FIVE, TWENTY SIX, AND TWENTY SEVEN,   POST-OPERATIVE DIAGNOSIS:  SAME+ RIGHT MANDIBULAR LINGUAL TORUS  PROCEDURE:  Procedures: DENTAL RESTORATION/EXTRACTIONS NUMBER TWENTY TWO, TWENTY THREE, TWENTY FOUR, TWENTY FIVE, TWENTY SIX, AND TWENTY SEVEN, ALVEOLOPLASTY, REDUCTION RIGHT MANDIBULAR LINGUAL TORUS  SURGEON:  Surgeon(s): Sheryle Hamilton, DMD  ANESTHESIA:   local and general  EBL:  minimal  DRAINS: none   SPECIMEN:  No Specimen  COUNTS:  YES  PLAN OF CARE: Discharge to home after PACU  PATIENT DISPOSITION:  PACU - hemodynamically stable.   PROCEDURE DETAILS: Dictation # [id number]  Hamilton EMERSON Sheryle, DMD 03/06/2024 12:57 PM

## 2024-03-06 NOTE — Anesthesia Procedure Notes (Addendum)
 Procedure Name: Intubation Date/Time: 03/06/2024 12:14 PM  Performed by: Hedy Jarred, CRNAPre-anesthesia Checklist: Patient identified, Emergency Drugs available, Suction available and Patient being monitored Patient Re-evaluated:Patient Re-evaluated prior to induction Oxygen Delivery Method: Circle system utilized Preoxygenation: Pre-oxygenation with 100% oxygen Induction Type: IV induction Ventilation: Mask ventilation without difficulty and Oral airway inserted - appropriate to patient size Laryngoscope Size: 4 and Glidescope Grade View: Grade I Nasal Tubes: Nasal prep performed, Nasal Rae, Left and Magill forceps- large, utilized Tube size: 7.5 mm Number of attempts: 2 Placement Confirmation: ETT inserted through vocal cords under direct vision, positive ETCO2 and breath sounds checked- equal and bilateral Tube secured with: Tape Dental Injury: Teeth and Oropharynx as per pre-operative assessment  Comments: #1 Mac 3 grade 2 attempt, unable to pass nasal tube #2 Glidescope mac 3 successful tube placement with Magills assist

## 2024-03-24 ENCOUNTER — Other Ambulatory Visit (HOSPITAL_COMMUNITY): Payer: Self-pay | Admitting: Cardiology

## 2024-06-11 ENCOUNTER — Inpatient Hospital Stay: Payer: MEDICAID | Admitting: Hematology and Oncology

## 2024-06-11 ENCOUNTER — Inpatient Hospital Stay: Payer: MEDICAID

## 2024-07-27 ENCOUNTER — Ambulatory Visit: Payer: MEDICAID

## 2024-10-26 ENCOUNTER — Ambulatory Visit: Payer: MEDICAID

## 2025-01-25 ENCOUNTER — Ambulatory Visit: Payer: MEDICAID

## 2025-04-26 ENCOUNTER — Ambulatory Visit: Payer: MEDICAID

## 2025-07-26 ENCOUNTER — Ambulatory Visit: Payer: MEDICAID
